# Patient Record
Sex: Male | Born: 1945
Health system: Southern US, Community
[De-identification: ages and names within clinical notes are randomized; demographics above are authoritative.]

## PROBLEM LIST (undated history)

## (undated) DIAGNOSIS — E785 Hyperlipidemia, unspecified: Secondary | ICD-10-CM

## (undated) DIAGNOSIS — U071 COVID-19: Secondary | ICD-10-CM

## (undated) DIAGNOSIS — M1990 Unspecified osteoarthritis, unspecified site: Secondary | ICD-10-CM

## (undated) DIAGNOSIS — H269 Unspecified cataract: Secondary | ICD-10-CM

## (undated) DIAGNOSIS — K5730 Diverticulosis of large intestine without perforation or abscess without bleeding: Secondary | ICD-10-CM

## (undated) DIAGNOSIS — C801 Malignant (primary) neoplasm, unspecified: Secondary | ICD-10-CM

## (undated) DIAGNOSIS — C61 Malignant neoplasm of prostate: Secondary | ICD-10-CM

## (undated) DIAGNOSIS — I2510 Atherosclerotic heart disease of native coronary artery without angina pectoris: Secondary | ICD-10-CM

## (undated) DIAGNOSIS — D126 Benign neoplasm of colon, unspecified: Secondary | ICD-10-CM

## (undated) DIAGNOSIS — R011 Cardiac murmur, unspecified: Secondary | ICD-10-CM

## (undated) DIAGNOSIS — E079 Disorder of thyroid, unspecified: Secondary | ICD-10-CM

## (undated) HISTORY — DX: Diverticulosis of large intestine without perforation or abscess without bleeding: K57.30

## (undated) HISTORY — DX: Disorder of thyroid, unspecified: E07.9

## (undated) HISTORY — DX: Malignant neoplasm of prostate: C61

## (undated) HISTORY — DX: Hyperlipidemia, unspecified: E78.5

## (undated) HISTORY — PX: POLYPECTOMY: SHX149

## (undated) HISTORY — DX: Unspecified cataract: H26.9

## (undated) HISTORY — DX: Malignant (primary) neoplasm, unspecified: C80.1

## (undated) HISTORY — DX: COVID-19: U07.1

## (undated) HISTORY — PX: TONSILLECTOMY: SUR1361

## (undated) HISTORY — PX: CARDIAC CATHETERIZATION: SHX172

## (undated) HISTORY — DX: Benign neoplasm of colon, unspecified: D12.6

---

## 1967-05-25 HISTORY — PX: KNEE SURGERY: SHX244

## 1985-05-24 HISTORY — PX: HERNIA REPAIR: SHX51

## 1997-05-24 HISTORY — PX: COLONOSCOPY: SHX174

## 1998-05-24 HISTORY — PX: LAMINECTOMY: SHX219

## 2002-05-24 HISTORY — PX: COLONOSCOPY: SHX174

## 2002-07-18 ENCOUNTER — Encounter: Payer: Self-pay | Admitting: Internal Medicine

## 2002-07-18 ENCOUNTER — Encounter: Admission: RE | Admit: 2002-07-18 | Discharge: 2002-07-18 | Payer: Self-pay | Admitting: Internal Medicine

## 2002-10-17 ENCOUNTER — Encounter: Payer: Self-pay | Admitting: Internal Medicine

## 2002-10-17 ENCOUNTER — Encounter: Admission: RE | Admit: 2002-10-17 | Discharge: 2002-10-17 | Payer: Self-pay | Admitting: Internal Medicine

## 2003-07-29 ENCOUNTER — Encounter: Payer: Self-pay | Admitting: Internal Medicine

## 2003-09-27 ENCOUNTER — Encounter: Admission: RE | Admit: 2003-09-27 | Discharge: 2003-09-27 | Payer: Self-pay | Admitting: Internal Medicine

## 2003-10-23 HISTORY — PX: HERNIA REPAIR: SHX51

## 2003-10-31 ENCOUNTER — Ambulatory Visit (HOSPITAL_COMMUNITY): Admission: RE | Admit: 2003-10-31 | Discharge: 2003-10-31 | Payer: Self-pay | Admitting: Surgery

## 2003-10-31 ENCOUNTER — Ambulatory Visit (HOSPITAL_BASED_OUTPATIENT_CLINIC_OR_DEPARTMENT_OTHER): Admission: RE | Admit: 2003-10-31 | Discharge: 2003-10-31 | Payer: Self-pay | Admitting: Surgery

## 2004-05-07 ENCOUNTER — Ambulatory Visit: Payer: Self-pay | Admitting: Internal Medicine

## 2004-05-14 ENCOUNTER — Ambulatory Visit (HOSPITAL_COMMUNITY): Admission: RE | Admit: 2004-05-14 | Discharge: 2004-05-15 | Payer: Self-pay | Admitting: Specialist

## 2004-06-09 ENCOUNTER — Ambulatory Visit: Payer: Self-pay | Admitting: Internal Medicine

## 2004-06-23 ENCOUNTER — Ambulatory Visit: Payer: Self-pay | Admitting: Internal Medicine

## 2004-10-28 ENCOUNTER — Ambulatory Visit: Payer: Self-pay | Admitting: Internal Medicine

## 2004-11-17 ENCOUNTER — Ambulatory Visit: Payer: Self-pay | Admitting: Internal Medicine

## 2004-12-11 ENCOUNTER — Ambulatory Visit: Payer: Self-pay

## 2005-03-04 ENCOUNTER — Ambulatory Visit: Payer: Self-pay | Admitting: Internal Medicine

## 2005-03-24 ENCOUNTER — Ambulatory Visit: Payer: Self-pay | Admitting: Internal Medicine

## 2005-03-25 ENCOUNTER — Ambulatory Visit: Payer: Self-pay | Admitting: Internal Medicine

## 2005-04-16 HISTORY — PX: PROSTATECTOMY: SHX69

## 2005-05-12 HISTORY — PX: BACK SURGERY: SHX140

## 2005-08-18 ENCOUNTER — Ambulatory Visit: Payer: Self-pay | Admitting: Internal Medicine

## 2006-06-20 ENCOUNTER — Ambulatory Visit: Payer: Self-pay | Admitting: Internal Medicine

## 2006-06-20 LAB — CONVERTED CEMR LAB
ALT: 35 units/L (ref 0–40)
AST: 36 units/L (ref 0–37)
Albumin: 4.1 g/dL (ref 3.5–5.2)
Alkaline Phosphatase: 42 units/L (ref 39–117)
BUN: 18 mg/dL (ref 6–23)
Basophils Absolute: 0 10*3/uL (ref 0.0–0.1)
Basophils Relative: 0.7 % (ref 0.0–1.0)
CO2: 26 meq/L (ref 19–32)
Calcium: 9.2 mg/dL (ref 8.4–10.5)
Chloride: 109 meq/L (ref 96–112)
Cholesterol: 146 mg/dL (ref 0–200)
Creatinine, Ser: 0.8 mg/dL (ref 0.4–1.5)
Eosinophils Absolute: 0.2 10*3/uL (ref 0.0–0.6)
Eosinophils Relative: 4 % (ref 0.0–5.0)
GFR calc Af Amer: 127 mL/min
GFR calc non Af Amer: 105 mL/min
Glucose, Bld: 96 mg/dL (ref 70–99)
HCT: 43.7 % (ref 39.0–52.0)
HDL: 44 mg/dL (ref >39.0)
Hemoglobin: 15.4 g/dL (ref 13.0–17.0)
LDL Cholesterol: 83 mg/dL (ref 0–99)
Lymphocytes Relative: 23.9 % (ref 12.0–46.0)
MCHC: 35.2 g/dL (ref 30.0–36.0)
MCV: 95.5 fL (ref 78.0–100.0)
Monocytes Absolute: 0.8 10*3/uL — ABNORMAL HIGH (ref 0.2–0.7)
Monocytes Relative: 15.2 % — ABNORMAL HIGH (ref 3.0–11.0)
Neutro Abs: 3.1 10*3/uL (ref 1.4–7.7)
Neutrophils Relative %: 56.2 % (ref 43.0–77.0)
Platelets: 226 10*3/uL (ref 150–400)
Potassium: 4.5 meq/L (ref 3.5–5.1)
RBC: 4.58 M/uL (ref 4.22–5.81)
RDW: 13.1 % (ref 11.5–14.6)
Sodium: 141 meq/L (ref 135–145)
TSH: 4.75 microintl units/mL (ref 0.35–5.50)
Total Bilirubin: 0.6 mg/dL (ref 0.3–1.2)
Total CHOL/HDL Ratio: 3.3
Total Protein: 7 g/dL (ref 6.0–8.3)
Triglycerides: 95 mg/dL (ref 0–149)
VLDL: 19 mg/dL (ref 0–40)
WBC: 5.4 10*3/uL (ref 4.5–10.5)

## 2006-06-27 ENCOUNTER — Ambulatory Visit: Payer: Self-pay | Admitting: Internal Medicine

## 2006-06-27 LAB — CONVERTED CEMR LAB
Basophils Absolute: 0 10*3/uL (ref 0.0–0.1)
Basophils Relative: 0.3 % (ref 0.0–1.0)
Eosinophils Absolute: 0.2 10*3/uL (ref 0.0–0.6)
Eosinophils Relative: 1.6 % (ref 0.0–5.0)
HCT: 43.1 % (ref 39.0–52.0)
Hemoglobin: 15 g/dL (ref 13.0–17.0)
Lymphocytes Relative: 14 % (ref 12.0–46.0)
MCHC: 34.8 g/dL (ref 30.0–36.0)
MCV: 95.2 fL (ref 78.0–100.0)
Monocytes Absolute: 1.5 10*3/uL — ABNORMAL HIGH (ref 0.2–0.7)
Monocytes Relative: 15 % — ABNORMAL HIGH (ref 3.0–11.0)
Neutro Abs: 6.8 10*3/uL (ref 1.4–7.7)
Neutrophils Relative %: 69.1 % (ref 43.0–77.0)
Platelets: 240 10*3/uL (ref 150–400)
RBC: 4.53 M/uL (ref 4.22–5.81)
RDW: 12.8 % (ref 11.5–14.6)
WBC: 9.9 10*3/uL (ref 4.5–10.5)

## 2006-08-30 ENCOUNTER — Ambulatory Visit: Payer: Self-pay | Admitting: Internal Medicine

## 2006-09-12 ENCOUNTER — Ambulatory Visit: Payer: Self-pay | Admitting: Internal Medicine

## 2007-01-12 ENCOUNTER — Ambulatory Visit: Payer: Self-pay | Admitting: Internal Medicine

## 2007-01-19 ENCOUNTER — Encounter (INDEPENDENT_AMBULATORY_CARE_PROVIDER_SITE_OTHER): Payer: Self-pay | Admitting: *Deleted

## 2007-01-19 LAB — CONVERTED CEMR LAB
ALT: 26 units/L (ref 0–53)
AST: 22 units/L (ref 0–37)
Cholesterol: 193 mg/dL (ref 0–200)
HDL: 42.7 mg/dL (ref >39.0)
LDL Cholesterol: 114 mg/dL — ABNORMAL HIGH (ref 0–99)
Total CHOL/HDL Ratio: 4.5
Triglycerides: 183 mg/dL — ABNORMAL HIGH (ref 0–149)
VLDL: 37 mg/dL (ref 0–40)

## 2007-02-06 ENCOUNTER — Ambulatory Visit: Payer: Self-pay | Admitting: Internal Medicine

## 2007-02-06 LAB — CONVERTED CEMR LAB
Cholesterol, target level: 200 mg/dL
HDL goal, serum: 40 mg/dL
LDL Goal: 160 mg/dL

## 2007-03-03 ENCOUNTER — Ambulatory Visit: Payer: Self-pay | Admitting: Internal Medicine

## 2007-03-03 DIAGNOSIS — K5730 Diverticulosis of large intestine without perforation or abscess without bleeding: Secondary | ICD-10-CM | POA: Insufficient documentation

## 2007-03-03 LAB — CONVERTED CEMR LAB
Basophils Absolute: 0 10*3/uL (ref 0.0–0.1)
Basophils Relative: 0.4 % (ref 0.0–1.0)
Eosinophils Absolute: 0.2 10*3/uL (ref 0.0–0.6)
Eosinophils Relative: 4.2 % (ref 0.0–5.0)
HCT: 42.6 % (ref 39.0–52.0)
Hemoglobin: 15.1 g/dL (ref 13.0–17.0)
Lymphocytes Relative: 25.4 % (ref 12.0–46.0)
MCHC: 35.3 g/dL (ref 30.0–36.0)
MCV: 95.7 fL (ref 78.0–100.0)
Monocytes Absolute: 0.9 10*3/uL — ABNORMAL HIGH (ref 0.2–0.7)
Monocytes Relative: 16.1 % — ABNORMAL HIGH (ref 3.0–11.0)
Neutro Abs: 3 10*3/uL (ref 1.4–7.7)
Neutrophils Relative %: 53.9 % (ref 43.0–77.0)
Platelets: 230 10*3/uL (ref 150–400)
RBC: 4.45 M/uL (ref 4.22–5.81)
RDW: 13.3 % (ref 11.5–14.6)
WBC: 5.5 10*3/uL (ref 4.5–10.5)

## 2007-03-06 ENCOUNTER — Encounter (INDEPENDENT_AMBULATORY_CARE_PROVIDER_SITE_OTHER): Payer: Self-pay | Admitting: *Deleted

## 2007-03-08 ENCOUNTER — Ambulatory Visit: Payer: Self-pay | Admitting: Internal Medicine

## 2007-03-10 ENCOUNTER — Encounter (INDEPENDENT_AMBULATORY_CARE_PROVIDER_SITE_OTHER): Payer: Self-pay | Admitting: *Deleted

## 2007-03-23 ENCOUNTER — Encounter: Payer: Self-pay | Admitting: Internal Medicine

## 2007-03-29 ENCOUNTER — Ambulatory Visit: Payer: Self-pay | Admitting: Internal Medicine

## 2007-03-29 LAB — CONVERTED CEMR LAB
BUN: 11 mg/dL (ref 6–23)
Creatinine, Ser: 0.7 mg/dL (ref 0.4–1.5)

## 2007-03-30 ENCOUNTER — Ambulatory Visit: Payer: Self-pay | Admitting: Cardiology

## 2007-04-07 ENCOUNTER — Encounter: Payer: Self-pay | Admitting: Internal Medicine

## 2007-04-07 ENCOUNTER — Encounter (INDEPENDENT_AMBULATORY_CARE_PROVIDER_SITE_OTHER): Payer: Self-pay | Admitting: *Deleted

## 2007-05-15 ENCOUNTER — Ambulatory Visit: Payer: Self-pay | Admitting: Gastroenterology

## 2007-05-25 HISTORY — PX: COLONOSCOPY: SHX174

## 2007-05-29 ENCOUNTER — Ambulatory Visit: Payer: Self-pay | Admitting: Internal Medicine

## 2007-06-15 ENCOUNTER — Ambulatory Visit: Payer: Self-pay | Admitting: Internal Medicine

## 2007-06-15 DIAGNOSIS — E7800 Pure hypercholesterolemia, unspecified: Secondary | ICD-10-CM | POA: Insufficient documentation

## 2007-06-15 DIAGNOSIS — E785 Hyperlipidemia, unspecified: Secondary | ICD-10-CM | POA: Insufficient documentation

## 2007-06-26 ENCOUNTER — Ambulatory Visit: Payer: Self-pay | Admitting: Internal Medicine

## 2007-09-22 DIAGNOSIS — D126 Benign neoplasm of colon, unspecified: Secondary | ICD-10-CM

## 2007-09-22 HISTORY — DX: Benign neoplasm of colon, unspecified: D12.6

## 2007-10-02 ENCOUNTER — Ambulatory Visit: Payer: Self-pay | Admitting: Gastroenterology

## 2007-10-12 ENCOUNTER — Encounter: Payer: Self-pay | Admitting: Gastroenterology

## 2007-10-12 ENCOUNTER — Ambulatory Visit: Payer: Self-pay | Admitting: Gastroenterology

## 2007-10-22 ENCOUNTER — Encounter: Payer: Self-pay | Admitting: Gastroenterology

## 2008-01-31 ENCOUNTER — Ambulatory Visit: Payer: Self-pay | Admitting: Internal Medicine

## 2008-02-04 LAB — CONVERTED CEMR LAB
ALT: 21 units/L (ref 0–53)
AST: 25 units/L (ref 0–37)
Albumin: 3.8 g/dL (ref 3.5–5.2)
Alkaline Phosphatase: 58 units/L (ref 39–117)
Bilirubin, Direct: 0.1 mg/dL (ref 0.0–0.3)
Cholesterol: 184 mg/dL (ref 0–200)
Glucose, Bld: 88 mg/dL (ref 70–99)
HDL: 47.2 mg/dL (ref >39.0)
LDL Cholesterol: 116 mg/dL — ABNORMAL HIGH (ref 0–99)
Total Bilirubin: 0.8 mg/dL (ref 0.3–1.2)
Total CHOL/HDL Ratio: 3.9
Total Protein: 6.9 g/dL (ref 6.0–8.3)
Triglycerides: 103 mg/dL (ref 0–149)
VLDL: 21 mg/dL (ref 0–40)

## 2008-02-05 ENCOUNTER — Encounter (INDEPENDENT_AMBULATORY_CARE_PROVIDER_SITE_OTHER): Payer: Self-pay | Admitting: *Deleted

## 2008-02-16 ENCOUNTER — Encounter (INDEPENDENT_AMBULATORY_CARE_PROVIDER_SITE_OTHER): Payer: Self-pay | Admitting: *Deleted

## 2008-03-04 ENCOUNTER — Ambulatory Visit: Payer: Self-pay | Admitting: Internal Medicine

## 2008-03-04 DIAGNOSIS — G479 Sleep disorder, unspecified: Secondary | ICD-10-CM | POA: Insufficient documentation

## 2008-03-04 DIAGNOSIS — F17200 Nicotine dependence, unspecified, uncomplicated: Secondary | ICD-10-CM | POA: Insufficient documentation

## 2008-03-04 DIAGNOSIS — Z8546 Personal history of malignant neoplasm of prostate: Secondary | ICD-10-CM | POA: Insufficient documentation

## 2008-06-17 ENCOUNTER — Ambulatory Visit: Payer: Self-pay | Admitting: Internal Medicine

## 2008-06-17 DIAGNOSIS — R609 Edema, unspecified: Secondary | ICD-10-CM | POA: Insufficient documentation

## 2008-06-17 DIAGNOSIS — M5412 Radiculopathy, cervical region: Secondary | ICD-10-CM | POA: Insufficient documentation

## 2008-06-17 DIAGNOSIS — R209 Unspecified disturbances of skin sensation: Secondary | ICD-10-CM | POA: Insufficient documentation

## 2008-06-17 DIAGNOSIS — M159 Polyosteoarthritis, unspecified: Secondary | ICD-10-CM | POA: Insufficient documentation

## 2008-06-17 DIAGNOSIS — G5600 Carpal tunnel syndrome, unspecified upper limb: Secondary | ICD-10-CM | POA: Insufficient documentation

## 2008-06-20 ENCOUNTER — Encounter (INDEPENDENT_AMBULATORY_CARE_PROVIDER_SITE_OTHER): Payer: Self-pay | Admitting: *Deleted

## 2008-10-29 ENCOUNTER — Ambulatory Visit: Payer: Self-pay | Admitting: Internal Medicine

## 2008-10-31 ENCOUNTER — Ambulatory Visit: Payer: Self-pay | Admitting: Internal Medicine

## 2008-10-31 ENCOUNTER — Encounter (INDEPENDENT_AMBULATORY_CARE_PROVIDER_SITE_OTHER): Payer: Self-pay | Admitting: *Deleted

## 2008-11-01 ENCOUNTER — Telehealth (INDEPENDENT_AMBULATORY_CARE_PROVIDER_SITE_OTHER): Payer: Self-pay | Admitting: *Deleted

## 2008-11-11 ENCOUNTER — Ambulatory Visit: Payer: Self-pay | Admitting: Internal Medicine

## 2008-11-12 ENCOUNTER — Encounter: Payer: Self-pay | Admitting: Internal Medicine

## 2008-11-20 LAB — CONVERTED CEMR LAB
Cholesterol: 195 mg/dL (ref 0–200)
Cholesterol: 202 mg/dL — ABNORMAL HIGH (ref 0–200)
HDL: 57 mg/dL (ref >39)
LDL Cholesterol: 124 mg/dL — ABNORMAL HIGH (ref 0–99)
TSH: 5.591 microintl units/mL — ABNORMAL HIGH (ref 0.350–4.500)
Total CHOL/HDL Ratio: 3.5
Triglycerides: 106 mg/dL (ref <150)
VLDL: 21 mg/dL (ref 0–40)

## 2008-11-21 ENCOUNTER — Encounter (INDEPENDENT_AMBULATORY_CARE_PROVIDER_SITE_OTHER): Payer: Self-pay | Admitting: *Deleted

## 2009-01-29 ENCOUNTER — Telehealth (INDEPENDENT_AMBULATORY_CARE_PROVIDER_SITE_OTHER): Payer: Self-pay | Admitting: *Deleted

## 2009-02-04 ENCOUNTER — Ambulatory Visit: Payer: Self-pay | Admitting: Internal Medicine

## 2009-02-09 LAB — CONVERTED CEMR LAB: TSH: 3.83 microintl units/mL (ref 0.35–5.50)

## 2009-02-10 ENCOUNTER — Encounter (INDEPENDENT_AMBULATORY_CARE_PROVIDER_SITE_OTHER): Payer: Self-pay | Admitting: *Deleted

## 2009-02-20 ENCOUNTER — Telehealth (INDEPENDENT_AMBULATORY_CARE_PROVIDER_SITE_OTHER): Payer: Self-pay | Admitting: *Deleted

## 2009-02-27 ENCOUNTER — Telehealth (INDEPENDENT_AMBULATORY_CARE_PROVIDER_SITE_OTHER): Payer: Self-pay | Admitting: *Deleted

## 2009-03-20 ENCOUNTER — Telehealth (INDEPENDENT_AMBULATORY_CARE_PROVIDER_SITE_OTHER): Payer: Self-pay | Admitting: *Deleted

## 2009-03-21 ENCOUNTER — Encounter: Payer: Self-pay | Admitting: Internal Medicine

## 2009-04-25 ENCOUNTER — Ambulatory Visit: Payer: Self-pay | Admitting: Family

## 2009-08-01 ENCOUNTER — Telehealth: Payer: Self-pay | Admitting: Internal Medicine

## 2009-08-04 ENCOUNTER — Telehealth (INDEPENDENT_AMBULATORY_CARE_PROVIDER_SITE_OTHER): Payer: Self-pay | Admitting: *Deleted

## 2009-09-05 ENCOUNTER — Ambulatory Visit: Payer: Self-pay | Admitting: Internal Medicine

## 2009-09-05 DIAGNOSIS — M542 Cervicalgia: Secondary | ICD-10-CM | POA: Insufficient documentation

## 2009-09-08 LAB — CONVERTED CEMR LAB
Basophils Absolute: 0 10*3/uL (ref 0.0–0.1)
Basophils Relative: 0 % (ref 0–1)
Eosinophils Absolute: 0.2 10*3/uL (ref 0.0–0.7)
Eosinophils Relative: 2 % (ref 0–5)
HCT: 46.4 % (ref 39.0–52.0)
Hemoglobin: 15.7 g/dL (ref 13.0–17.0)
Lymphocytes Relative: 18 % (ref 12–46)
Lymphs Abs: 1.7 10*3/uL (ref 0.7–4.0)
MCHC: 33.8 g/dL (ref 30.0–36.0)
MCV: 97.7 fL (ref 78.0–100.0)
Monocytes Absolute: 1.3 10*3/uL — ABNORMAL HIGH (ref 0.1–1.0)
Monocytes Relative: 13 % — ABNORMAL HIGH (ref 3–12)
Neutro Abs: 6.4 10*3/uL (ref 1.7–7.7)
Neutrophils Relative %: 67 % (ref 43–77)
Platelets: 226 10*3/uL (ref 150–400)
RBC: 4.75 M/uL (ref 4.22–5.81)
RDW: 13.7 % (ref 11.5–15.5)
Sed Rate: 8 mm/hr (ref 0–16)
WBC: 9.5 10*3/uL (ref 4.0–10.5)

## 2009-10-02 ENCOUNTER — Encounter: Payer: Self-pay | Admitting: Internal Medicine

## 2009-11-18 ENCOUNTER — Encounter (INDEPENDENT_AMBULATORY_CARE_PROVIDER_SITE_OTHER): Payer: Self-pay | Admitting: *Deleted

## 2009-11-18 ENCOUNTER — Telehealth (INDEPENDENT_AMBULATORY_CARE_PROVIDER_SITE_OTHER): Payer: Self-pay | Admitting: *Deleted

## 2009-11-21 ENCOUNTER — Ambulatory Visit: Payer: Self-pay | Admitting: Internal Medicine

## 2009-11-25 LAB — CONVERTED CEMR LAB
ALT: 32 units/L (ref 0–53)
AST: 31 units/L (ref 0–37)
Albumin: 4.3 g/dL (ref 3.5–5.2)
Alkaline Phosphatase: 61 units/L (ref 39–117)
Bilirubin, Direct: 0.2 mg/dL (ref 0.0–0.3)
Cholesterol: 250 mg/dL — ABNORMAL HIGH (ref 0–200)
Direct LDL: 95.3 mg/dL
HDL: 50.1 mg/dL (ref >39.00)
Total Bilirubin: 0.4 mg/dL (ref 0.3–1.2)
Total CHOL/HDL Ratio: 5
Total Protein: 7.2 g/dL (ref 6.0–8.3)
Triglycerides: 782 mg/dL — ABNORMAL HIGH (ref 0.0–149.0)
VLDL: 156.4 mg/dL — ABNORMAL HIGH (ref 0.0–40.0)

## 2009-12-09 ENCOUNTER — Ambulatory Visit: Payer: Self-pay | Admitting: Internal Medicine

## 2009-12-16 ENCOUNTER — Encounter: Payer: Self-pay | Admitting: Internal Medicine

## 2009-12-26 ENCOUNTER — Encounter: Payer: Self-pay | Admitting: Internal Medicine

## 2010-02-26 ENCOUNTER — Telehealth (INDEPENDENT_AMBULATORY_CARE_PROVIDER_SITE_OTHER): Payer: Self-pay | Admitting: *Deleted

## 2010-03-06 ENCOUNTER — Telehealth: Payer: Self-pay | Admitting: Internal Medicine

## 2010-03-19 ENCOUNTER — Ambulatory Visit: Payer: Self-pay | Admitting: Internal Medicine

## 2010-03-19 DIAGNOSIS — K143 Hypertrophy of tongue papillae: Secondary | ICD-10-CM | POA: Insufficient documentation

## 2010-03-19 DIAGNOSIS — IMO0001 Reserved for inherently not codable concepts without codable children: Secondary | ICD-10-CM | POA: Insufficient documentation

## 2010-03-19 DIAGNOSIS — L719 Rosacea, unspecified: Secondary | ICD-10-CM | POA: Insufficient documentation

## 2010-03-19 DIAGNOSIS — J029 Acute pharyngitis, unspecified: Secondary | ICD-10-CM | POA: Insufficient documentation

## 2010-03-19 DIAGNOSIS — M2550 Pain in unspecified joint: Secondary | ICD-10-CM | POA: Insufficient documentation

## 2010-03-19 LAB — CONVERTED CEMR LAB: Rapid Strep: NEGATIVE

## 2010-03-20 LAB — CONVERTED CEMR LAB
Basophils Absolute: 0.1 10*3/uL (ref 0.0–0.1)
Basophils Relative: 0.5 % (ref 0.0–3.0)
Eosinophils Absolute: 0.1 10*3/uL (ref 0.0–0.7)
Eosinophils Relative: 1 % (ref 0.0–5.0)
HCT: 43.8 % (ref 39.0–52.0)
Hemoglobin: 15 g/dL (ref 13.0–17.0)
Lymphocytes Relative: 14.3 % (ref 12.0–46.0)
Lymphs Abs: 1.5 10*3/uL (ref 0.7–4.0)
MCHC: 34.3 g/dL (ref 30.0–36.0)
MCV: 98.9 fL (ref 78.0–100.0)
Monocytes Absolute: 1.2 10*3/uL — ABNORMAL HIGH (ref 0.1–1.0)
Monocytes Relative: 11.8 % (ref 3.0–12.0)
Neutro Abs: 7.6 10*3/uL (ref 1.4–7.7)
Neutrophils Relative %: 72.4 % (ref 43.0–77.0)
Platelets: 231 10*3/uL (ref 150.0–400.0)
RBC: 4.43 M/uL (ref 4.22–5.81)
RDW: 13.7 % (ref 11.5–14.6)
Sed Rate: 15 mm/hr (ref 0–22)
Total CK: 79 units/L (ref 7–232)
WBC: 10.4 10*3/uL (ref 4.5–10.5)

## 2010-03-31 ENCOUNTER — Ambulatory Visit: Payer: Self-pay | Admitting: Internal Medicine

## 2010-04-06 LAB — CONVERTED CEMR LAB
Cholesterol: 179 mg/dL (ref 0–200)
HDL: 56 mg/dL (ref >39.00)
Hgb A1c MFr Bld: 5.7 % (ref 4.6–6.5)
LDL Cholesterol: 100 mg/dL — ABNORMAL HIGH (ref 0–99)
TSH: 2.97 microintl units/mL (ref 0.35–5.50)
Total CHOL/HDL Ratio: 3
Triglycerides: 117 mg/dL (ref 0.0–149.0)
VLDL: 23.4 mg/dL (ref 0.0–40.0)

## 2010-04-08 ENCOUNTER — Ambulatory Visit: Payer: Self-pay | Admitting: Internal Medicine

## 2010-04-20 ENCOUNTER — Ambulatory Visit: Payer: Self-pay | Admitting: Internal Medicine

## 2010-04-20 DIAGNOSIS — J069 Acute upper respiratory infection, unspecified: Secondary | ICD-10-CM | POA: Insufficient documentation

## 2010-05-14 ENCOUNTER — Ambulatory Visit: Payer: Self-pay | Admitting: Internal Medicine

## 2010-05-14 DIAGNOSIS — R109 Unspecified abdominal pain: Secondary | ICD-10-CM | POA: Insufficient documentation

## 2010-05-15 ENCOUNTER — Encounter: Payer: Self-pay | Admitting: Internal Medicine

## 2010-05-15 ENCOUNTER — Telehealth: Payer: Self-pay | Admitting: Internal Medicine

## 2010-05-15 ENCOUNTER — Telehealth (INDEPENDENT_AMBULATORY_CARE_PROVIDER_SITE_OTHER): Payer: Self-pay | Admitting: *Deleted

## 2010-05-15 LAB — CONVERTED CEMR LAB
Basophils Absolute: 0.1 10*3/uL (ref 0.0–0.1)
Basophils Relative: 0.6 % (ref 0.0–3.0)
Eosinophils Absolute: 0.1 10*3/uL (ref 0.0–0.7)
Eosinophils Relative: 0.6 % (ref 0.0–5.0)
HCT: 39.3 % (ref 39.0–52.0)
Hemoglobin: 13.7 g/dL (ref 13.0–17.0)
Lymphocytes Relative: 17.7 % (ref 12.0–46.0)
Lymphs Abs: 1.7 10*3/uL (ref 0.7–4.0)
MCHC: 34.8 g/dL (ref 30.0–36.0)
MCV: 95.5 fL (ref 78.0–100.0)
Monocytes Absolute: 1.4 10*3/uL — ABNORMAL HIGH (ref 0.1–1.0)
Monocytes Relative: 14.2 % — ABNORMAL HIGH (ref 3.0–12.0)
Neutro Abs: 6.5 10*3/uL (ref 1.4–7.7)
Neutrophils Relative %: 66.9 % (ref 43.0–77.0)
Platelets: 218 10*3/uL (ref 150.0–400.0)
RBC: 4.11 M/uL — ABNORMAL LOW (ref 4.22–5.81)
RDW: 13.8 % (ref 11.5–14.6)
WBC: 9.7 10*3/uL (ref 4.5–10.5)

## 2010-05-21 ENCOUNTER — Telehealth: Payer: Self-pay | Admitting: Internal Medicine

## 2010-05-22 ENCOUNTER — Telehealth (INDEPENDENT_AMBULATORY_CARE_PROVIDER_SITE_OTHER): Payer: Self-pay | Admitting: *Deleted

## 2010-06-10 ENCOUNTER — Telehealth (INDEPENDENT_AMBULATORY_CARE_PROVIDER_SITE_OTHER): Payer: Self-pay | Admitting: *Deleted

## 2010-06-21 LAB — CONVERTED CEMR LAB
Cholesterol, target level: 200 mg/dL
Free T4: 0.7 ng/dL (ref 0.6–1.6)
HDL goal, serum: 40 mg/dL
LDL Goal: 120 mg/dL
TSH: 5.16 microintl units/mL (ref 0.35–5.50)

## 2010-06-23 NOTE — Assessment & Plan Note (Signed)
Summary: med refill - cbs   Vital Signs:  Patient Profile:   65 Years Old Male Height:     67.75 inches Weight:      186.8 pounds Temp:     98.2 degrees F oral Pulse rate:   71 / minute Resp:     15 per minute BP sitting:   120 / 70  (left arm) Cuff size:   large  Vitals Entered By: Shonna Chock (March 04, 2008 2:36 PM)                 Chief Complaint:  MEDICATION REFILL.  Lipid Management History:      Positive NCEP/ATP III risk factors include male age 89 years old or older, family history for ischemic heart disease (males less than 49 years old), and current tobacco user.  Negative NCEP/ATP III risk factors include non-diabetic, non-hypertensive, no ASHD (atherosclerotic heart disease), no prior stroke/TIA, no peripheral vascular disease, and no history of aortic aneurysm.       Current Allergies (reviewed today): ! * NEUROTIN ! * OXYCODONE ! LIPITOR ! * LEVITA ! * VIAGRA ! * CIALIS  Past Medical History:    diverticulosis, colon w/o hemorrhage    Hyperlipidemia    prostate cancer    colon polyp    hernia inguinal left side  Past Surgical History:    laminectomy L4-5    herniorrhaphy 1987    knee surgery, patellar fracture fragment     Tonsillectomy    colonoscopy neg ,1999    colonoscopy tics, hemorrhoids 2004    herniorrhaphy 10/2003    back surgery 05/12/2005    prostatectomy 04/16/2005   Family History:    Puncle CVA age 36    mother lung cancer smoker    father bladder cancer, prostate cancer,colon cancer,renal calculi    paternal uncle HTN, CVA ; sister lung CA d age 29  Social History:    no diet    Occupation: Community education officer [employer]    Married    Current Smoker    Alcohol use-yes       Risk Factors:  Tobacco use:  current Alcohol use:  yes Exercise:  yes    Times per week:  2    Type:  elliptical 20  min  w/o C-P sx    Review of Systems  General      Complains of sleep disorder and sweats.      Denies fatigue and weight loss.    Poor sleep ; night sweats several yrs  ENT      Complains of hoarseness.      Denies difficulty swallowing.  CV      Denies bluish discoloration of lips or nails, chest pain or discomfort, difficulty breathing at night, difficulty breathing while lying down, leg cramps with exertion, palpitations, shortness of breath with exertion, swelling of feet, and swelling of hands.  Resp      Denies cough, excessive snoring, hypersomnolence, morning headaches, and sputum productive.      CXray 6/09 post fall : NAD  GI      Complains of hemorrhoids.      Denies bloody stools, dark tarry stools, and indigestion.  GU      He sees Dr Margo Aye in [facility]  MS      Complains of low back pain.      Denies joint pain, mid back pain, and thoracic pain.  Derm      Denies changes in nail beds, dryness,  hair loss, and lesion(s).  Neuro      Denies numbness, tingling, and weakness.  Endo      Denies cold intolerance, excessive hunger, excessive thirst, excessive urination, heat intolerance, polyuria, and weight change.  Heme      Denies abnormal bruising and bleeding.   Physical Exam  General:     well-nourished,in no acute distress; alert,appropriate and cooperative throughout examination Neck:     No deformities, masses, or tenderness noted. Lungs:     Normal respiratory effort, chest expands symmetrically. Lungs are clear to auscultation, no crackles or wheezes. Heart:     normal rate, regular rhythm, no gallop, no rub, no JVD, no HJR, and grade 1 /6 systolic murmur.   Abdomen:     Bowel sounds positive,abdomen soft and non-tender without masses, organomegaly or hernias noted. Genitalia:     as per Dr Margo Aye Prostate:     as per Dr Margo Aye Msk:     No deformity or scoliosis noted of thoracic or lumbar spine.   Pulses:     R and L carotid,radial,dorsalis pedis and posterior tibial pulses are full and equal bilaterally Extremities:     No clubbing, cyanosis, edema. Minor DJD of  hands Neurologic:     alert & oriented X3 and DTRs symmetrical and normal.   Skin:     Intact without suspicious lesions or rashes Cervical Nodes:     No lymphadenopathy noted Axillary Nodes:     No palpable lymphadenopathy Psych:     memory intact for recent and remote, normally interactive, and good eye contact.      Impression & Recommendations:  Problem # 1:  HYPERLIPIDEMIA (ICD-272.4)  His updated medication list for this problem includes:    Vytorin 10-20 Mg Tabs (Ezetimibe-simvastatin) .Marland Kitchen... 1 by mouth qd  Orders: EKG w/ Interpretation (93000)   Problem # 2:  NONDEPENDENT TOBACCO USE DISORDER (ICD-305.1) cigar smoker Orders: EKG w/ Interpretation (93000)   Problem # 3:  PROSTATE CANCER, HX OF (ICD-V10.46) as per Dr Margo Aye  Problem # 4:  NEOPLASM, MALIGNANT, LUNG, FAMILY HX (ICD-V16.1)  Mother & sister Orders: EKG w/ Interpretation (93000)   Problem # 5:  DIVERTICULOSIS, COLON W/O HEM (ICD-562.10) as per Dr Russella Dar  Problem # 6:  SLEEP DISORDER (ICD-780.50)  Complete Medication List: 1)  Vytorin 10-20 Mg Tabs (Ezetimibe-simvastatin) .Marland Kitchen.. 1 by mouth qd 2)  Vit E  3)  Vit C  4)  Beta Carotene  5)  Clonazepam 0.5 Mg Tabs (Clonazepam) .Marland Kitchen.. 1 at bedtime prn  Lipid Assessment/Plan:      Based on NCEP/ATP III, the patient's risk factor category is "2 or more risk factors and a calculated 10 year CAD risk of < 20%".  The patient's lipid goals have been set as follows: Total cholesterol goal is 200; LDL cholesterol goal is 120; HDL cholesterol goal is 40; Triglyceride goal is 150.  His LDL cholesterol goal has been met.     Patient Instructions: 1)  Stop Smoking Tips: Choose a Quit date. Cut down before the Quit date. decide what you will do as a substitute when you feel the urge to smoke(gum,toothpick,exercise).   Prescriptions: CLONAZEPAM 0.5 MG TABS (CLONAZEPAM) 1 at bedtime prn  #30 x 5   Entered and Authorized by:   Marga Melnick MD   Signed by:   Marga Melnick MD on 03/04/2008   Method used:   Print then Give to Patient   RxID:   [id number] VYTORIN 10-20 MG  TABS (EZETIMIBE-SIMVASTATIN) 1 by mouth qd  #90 x 3   Entered and Authorized by:   Marga Melnick MD   Signed by:   Marga Melnick MD on 03/04/2008   Method used:   Print then Give to Patient   RxID:   [id number]  ]

## 2010-06-23 NOTE — Assessment & Plan Note (Signed)
Summary: 4 MONTH FOLLOWUP--NEEDS 1ST VISIT OF DAY///SPH   Vital Signs:  Patient profile:   65 year old male Height:      67.75 inches Weight:      180 pounds Temp:     98.1 degrees F oral Pulse rate:   80 / minute Resp:     12 per minute BP sitting:   118 / 76  (left arm)  Vitals Entered By: Jeremy Johann CMA (April 08, 2010 8:20 AM) CC: f/u review labs, Lipid Management   CC:  f/u review labs and Lipid Management.  History of Present Illness: Hyperlipidemia Follow-Up      This is a 65 year old man who presents for Hyperlipidemia follow-up.  The patient denies muscle aches, GI upset, abdominal pain, flushing, itching, constipation, diarrhea, and fatigue.  The patient denies the following symptoms: chest pain/pressure, exercise intolerance, dypsnea, palpitations, syncope, and pedal edema.  Compliance with medications (by patient report) has been near 100%.  Dietary compliance has been good.  The patient reports exercising daily as 30 min on Elliptical , calisthentics, & free weights. Lipids are @ goal..    Lipid Management History:      Positive NCEP/ATP III risk factors include male age 81 years old or older.  Negative NCEP/ATP III risk factors include non-diabetic, no family history for ischemic heart disease, non-tobacco-user status, non-hypertensive, no ASHD (atherosclerotic heart disease), no prior stroke/TIA, no peripheral vascular disease, and no history of aortic aneurysm.     Preventive Screening-Counseling & Management  Alcohol-Tobacco     Smoking Status: quit  Current Medications (verified): 1)  Vytorin 10-20 Mg  Tabs (Ezetimibe-Simvastatin) .Marland Kitchen.. 1 By Mouth Qd 2)  Vit E 3)  Vit C 4)  Beta Carotene 5)  Clonazepam 0.5 Mg Tabs (Clonazepam) .Marland Kitchen.. 1 At Bedtime As Needed 6)  Synthroid 25 Mcg Tabs (Levothyroxine Sodium) .Marland Kitchen.. 1 Once Daily 7)  Metrogel 1 % Gel (Metronidazole) .... Apply Once Daily After Cleansing  Allergies (verified): 1)  ! * Neurotin 2)  ! *  Oxycodone 3)  ! Lipitor 4)  ! * Levita 5)  ! * Viagra 6)  ! * Cialis  Past History:  Past Medical History: diverticulosis, colon w/o hemorrhage Hyperlipidemia: NMR Lipoprofile 2005: LDL 562(1308/657), HDL 66, TG 66. LDL goal = < 120, ideally < 95. Framingham Study LDL goal = < 160 if non smoker, < 130 if smoker prostate cancer colon polyp hernia inguinal left side  Family History: P uncle:HTN,alcoholism, CVA age 57 mother: lung cancer smoker father: bladder cancer, prostate cancer,colon cancer,renal calculi  ; sister: lung  cancer  d age 31  Social History: Smoking Status:  quit  Physical Exam  General:  well-nourished; alert,appropriate and cooperative throughout examination Heart:  Normal rate and regular rhythm. S1 and S2 normal without gallop, murmur, click, rub.S4 Pulses:  R and L carotid,radial,dorsalis pedis and posterior tibial pulses are full and equal bilaterally Psych:  Focused & intelligent   Impression & Recommendations:  Problem # 1:  HYPERLIPIDEMIA (ICD-272.4)  The following medications were removed from the medication list:    Vytorin 10-20 Mg Tabs (Ezetimibe-simvastatin) .Marland Kitchen... 1 by mouth qd His updated medication list for this problem includes:    Pravastatin Sodium 20 Mg Tabs (Pravastatin sodium) .Marland Kitchen... 1 at bedtime  Complete Medication List: 1)  Vit E  2)  Vit C  3)  Beta Carotene  4)  Clonazepam 0.5 Mg Tabs (Clonazepam) .Marland Kitchen.. 1 at bedtime as needed 5)  Synthroid 25 Mcg  Tabs (Levothyroxine sodium) .Marland Kitchen.. 1 once daily 6)  Metrogel 1 % Gel (Metronidazole) .... Apply once daily after cleansing 7)  Pravastatin Sodium 20 Mg Tabs (Pravastatin sodium) .Marland Kitchen.. 1 at bedtime  Lipid Assessment/Plan:      Based on NCEP/ATP III, the patient's risk factor category is "0-1 risk factors".  The patient's lipid goals are as follows: Total cholesterol goal is 200; LDL cholesterol goal is 120; HDL cholesterol goal is 40; Triglyceride goal is 150.  His LDL cholesterol goal  has been met.    Patient Instructions: 1)  Fasting Labs after 10 weeks of Pravastatin 20 mg at bedtime : 2)  Hepatic Panel ,CK, ICD-9:995.20 3)  Lipid Panel , ICD-9:272.4 4)  Take an 81 mg coated Aspirin every day. Prescriptions: PRAVASTATIN SODIUM 20 MG TABS (PRAVASTATIN SODIUM) 1 at bedtime  #90 x 0   Entered and Authorized by:   Marga Melnick MD   Signed by:   Marga Melnick MD on 04/08/2010   Method used:   Print then Give to Patient   RxID:   [id number]    Orders Added: 1)  Est. Patient Level III [36644]

## 2010-06-23 NOTE — Consult Note (Signed)
Summary: [facility]  [facility]   Imported By: Lanelle Bal 10/08/2009 09:57:55  _____________________________________________________________________  External Attachment:    Type:   Image     Comment:   External Document

## 2010-06-23 NOTE — Progress Notes (Signed)
Summary: REFILL REQUEST  Phone Note Refill Request Call back at [phone] Message from:  Pharmacy on November 18, 2009 9:33 AM  Refills Requested: Medication #1:  SYNTHROID 25 MCG TABS 1 once daily   Dosage confirmed as above?Dosage Confirmed   Supply Requested: 3 months   Last Refilled: 08/04/2009  Medication #2:  VYTORIN 10-20 MG  TABS 1 by mouth qd   Dosage confirmed as above?Dosage Confirmed   Supply Requested: 3 months   Last Refilled: 09/16/2009 [facility] DELIVERY  Next Appointment Scheduled: NONE Initial call taken by: Lavell Islam,  November 18, 2009 9:34 AM    Prescriptions: SYNTHROID 25 MCG TABS (LEVOTHYROXINE SODIUM) 1 once daily, Additional Refills require an Appointment  #90 x 0   Entered by:   Shonna Chock   Authorized by:   Marga Melnick MD   Signed by:   Shonna Chock on 11/18/2009   Method used:   Printed then faxed to ...       [facility] (mail-order)             , [place]         Ph: [phone]       Fax: [phone]   RxID:   [id number] VYTORIN 10-20 MG  TABS (EZETIMIBE-SIMVASTATIN) 1 by mouth qd  #90 x 0   Entered by:   Shonna Chock   Authorized by:   Marga Melnick MD   Signed by:   Shonna Chock on 11/18/2009   Method used:   Printed then faxed to ...       [facility] (mail-order)             , [place]         Ph: [phone]       Fax: [phone]   RxID:   [id number]

## 2010-06-23 NOTE — Letter (Signed)
Summary: Results Follow up Letter  [place] [facility]  [address] [place], [place] [address]   Phone: [phone]  Fax: [phone]    01/19/2007 MRN: [id number]  Jeremy Waters [address] [place], [place]  [address]  Dear Mr. Troia,  The following are the results of your recent test(s):  Test         Result    Pap Smear:        Normal _____  Not Normal _____ Comments: ______________________________________________________ Cholesterol: LDL(Bad cholesterol):         Your goal is less than:         HDL (Good cholesterol):       Your goal is more than: Comments:  ______________________________________________________ Mammogram:        Normal _____  Not Normal _____ Comments:  ___________________________________________________________________ Hemoccult:        Normal _____  Not normal _______ Comments:    _____________________________________________________________________ Other Tests:  Please see attached results and comments   We routinely do not discuss normal results over the telephone.  If you desire a copy of the results, or you have any questions about this information we can discuss them at your next office visit.   Sincerely,

## 2010-06-23 NOTE — Letter (Signed)
Summary: Primary Care Appointment Letter  [facility] [place]  [address] [place], [place] [address]   Phone: [phone]  Fax: [phone]    02/16/2008 MRN: [id number]  LATAVIUS CAPIZZI [address] [place], [place]  [address]  Dear Mr. Branford,   Your Primary Care Physician  has indicated that:    _______it is time to schedule an appointment.    _______you missed your appointment on______ and need to call and          reschedule.    _______you need to have lab work done.    _______you need to schedule an appointment discuss lab or test results.    ___X____we reschedule your appt with Dr. Alwyn Ren from 02/19/08 @ 3:30 to Oct. 12, 2009 @ 2:00pm     Please call our office if you have any questions at [phone]     Thank you,    [facility] Primary Care Scheduler

## 2010-06-23 NOTE — Letter (Signed)
Summary: Results Follow up Letter  [place] at [facility]  [address] [place], [place] [address]   Phone: [phone]  Fax: [phone]    06/20/2008 MRN: [id number]  BRYNDAN BILYK [address] [place], [place]  [address]  Dear Mr. Gidley,  The following are the results of your recent test(s):  Test         Result    Pap Smear:        Normal _____  Not Normal _____ Comments: ______________________________________________________ Cholesterol: LDL(Bad cholesterol):         Your goal is less than:         HDL (Good cholesterol):       Your goal is more than: Comments:  ______________________________________________________ Mammogram:        Normal _____  Not Normal _____ Comments:  ___________________________________________________________________ Hemoccult:        Normal _____  Not normal _______ Comments:    _____________________________________________________________________ Other Tests: PLEASE SEE ATTACHED LABS DONE ON 06/17/2008 AND APPOINTMENT CARD TO RECHECK LABS IN 4-6 MONTHS    We routinely do not discuss normal results over the telephone.  If you desire a copy of the results, or you have any questions about this information we can discuss them at your next office visit.   Sincerely,

## 2010-06-23 NOTE — Progress Notes (Signed)
Summary: hopp-refill  Phone Note Refill Request Call back at Home Phone [phone] Message from:  Patient wife on [facility]  Refills Requested: Medication #1:  VYTORIN 10-20 MG  TABS 1 by mouth qd Initial call taken by: Jeremy Johann CMA,  March 20, 2009 9:14 AM  Follow-up for Phone Call        left pt detail message rx sent to pharmacy..........Marland KitchenFelecia Deloach CMA  March 20, 2009 9:19 AM     Prescriptions: VYTORIN 10-20 MG  TABS (EZETIMIBE-SIMVASTATIN) 1 by mouth qd  #90 x 2   Entered by:   Jeremy Johann CMA   Authorized by:   Marga Melnick MD   Signed by:   Jeremy Johann CMA on 03/20/2009   Method used:   Faxed to ...       [facility] (mail-order)             , [place]         Ph: [phone]       Fax: [phone]   RxID:   [id number]

## 2010-06-23 NOTE — Assessment & Plan Note (Signed)
Summary: SINUS INFECTION?/KN   Vital Signs:  Patient profile:   65 year old male Weight:      181.8 pounds BMI:     27.95 Temp:     98.4 degrees F oral Pulse rate:   64 / minute Resp:     14 per minute BP sitting:   122 / 78  (left arm) Cuff size:   large  Vitals Entered By: Shonna Chock CMA (April 20, 2010 4:13 PM) CC: Sinus Infection x 8-9 days , URI symptoms   CC:  Sinus Infection x 8-9 days  and URI symptoms.  History of Present Illness: URI Symptoms      This is a 65 year old man who presents with URI symptoms x 10 days.  The patient reports nasal congestion, purulent nasal discharge as PNDrainage with some  sore throat &  productive cough, and sick contacts, but denies earache.  The patient denies fever, dyspnea, and wheezing.  The patient denies headache.  The patient denies the following risk factors for Strep sinusitis: unilateral facial pain, tooth pain, and tender adenopathy.  Rx: Sudafed  Current Medications (verified): 1)  Vit E 2)  Vit C 3)  Beta Carotene 4)  Clonazepam 0.5 Mg Tabs (Clonazepam) .Marland Kitchen.. 1 At Bedtime As Needed 5)  Synthroid 25 Mcg Tabs (Levothyroxine Sodium) .Marland Kitchen.. 1 Once Daily 6)  Pravastatin Sodium 20 Mg Tabs (Pravastatin Sodium) .Marland Kitchen.. 1 At Bedtime  Allergies: 1)  ! * Neurotin 2)  ! * Oxycodone 3)  ! Lipitor 4)  ! * Levita 5)  ! * Viagra 6)  ! * Cialis  Physical Exam  General:  in no acute distress; alert,appropriate and cooperative throughout examination Ears:  External ear exam shows no significant lesions or deformities.  Otoscopic examination reveals clear canals, tympanic membranes are intact bilaterally without bulging, retraction, inflammation or discharge. Hearing is grossly normal bilaterally. Nose:  External nasal examination shows no deformity or inflammation. Nasal mucosa are pink and moist without lesions or exudates. Mouth:  Oral mucosa and oropharynx without lesions or exudates.  Teeth in good repair. Mild erythema Lungs:   Normal respiratory effort, chest expands symmetrically. Lungs are clear to auscultation, no crackles or wheezes. Cervical Nodes:  No lymphadenopathy noted Axillary Nodes:  No palpable lymphadenopathy   Impression & Recommendations:  Problem # 1:  URI (ICD-465.9)  Complete Medication List: 1)  Vit E  2)  Vit C  3)  Beta Carotene  4)  Clonazepam 0.5 Mg Tabs (Clonazepam) .Marland Kitchen.. 1 at bedtime as needed 5)  Synthroid 25 Mcg Tabs (Levothyroxine sodium) .Marland Kitchen.. 1 once daily 6)  Pravastatin Sodium 20 Mg Tabs (Pravastatin sodium) .Marland Kitchen.. 1 at bedtime 7)  Azithromycin 250 Mg Tabs (Azithromycin) .... As per pack  Patient Instructions: 1)  Neti pot once daily as needed for congestion. 2)  Drink as much  NON dairy fluid as you can tolerate for the next few days. Prescriptions: AZITHROMYCIN 250 MG TABS (AZITHROMYCIN) as per pack  #1 x 0   Entered and Authorized by:   Marga Melnick MD   Signed by:   Marga Melnick MD on 04/20/2010   Method used:   Faxed to ...       [facility] [address] (retail)       [address]       [place], [place]  [address]       Ph: [phone] or [phone]       Fax: [phone]   RxID:  [id number]    Orders Added: 1)  Est. Patient Level III [[address]782]

## 2010-06-23 NOTE — Miscellaneous (Signed)
Summary: GI previsit  Clinical Lists Changes  Medications: Added new medication of MOVIPREP 100 GM  SOLR (PEG-KCL-NACL-NASULF-NA ASC-C) As per prep instructions. - Signed Rx of MOVIPREP 100 GM  SOLR (PEG-KCL-NACL-NASULF-NA ASC-C) As per prep instructions.;  #1 x 0;  Signed;  Entered by: Jennye Boroughs RN;  Authorized by: Meryl Dare MD;  Method used: Electronic    Prescriptions: MOVIPREP 100 GM  SOLR (PEG-KCL-NACL-NASULF-NA ASC-C) As per prep instructions.  #1 x 0   Entered by:   Jennye Boroughs RN   Authorized by:   Meryl Dare MD   Signed by:   Jennye Boroughs RN on 10/02/2007   Method used:   Electronically sent to ...       [id number]       [address]       [place], [place]  [address]       Ph: [phone]       Fax: [phone]   RxID:   [id number]

## 2010-06-23 NOTE — Progress Notes (Signed)
Summary: STILL HAVE PROBLEMS WITH LEVOTHYROXINE PRESCRIPTION  Phone Note Call from Patient   Caller: Patient Summary of Call: PATIENT STATES HE JUST HUNG UP FROM [facility] AND THEY STILL HAVE NO RECORD OF HIS LEVOTHYROXINE PRESCRIPTION---HE SAYS THAT THEY ALSO NEED A FAXED SIGNATURE BY DR HOPPER  HE GAVE CONTACT PHONE NUMBER AS [phone]  HE NEEDS TO BE CALLED AT [phone] TO BE REASSURED THAT PRESCRIPTIION HAS BEEN CALLED IN---HE SAYS THIS HAS BEEN GOING ON FOR 6 WEEKS AND HE HAS BEEN OUT OF HIS MEDICATION FOR OVER ONE WEEK Initial call taken by: Jerolyn Shin,  March 06, 2010 3:53 PM  Follow-up for Phone Call        Received vm at [facility] delivery and left message that i spoke with someone earlier today and re-provided prescription. I was adament that this has been taken care of and I need a call back before 5 pm today. Follow-up by: Lucious Groves CMA,  March 06, 2010 3:59 PM  Additional Follow-up for Phone Call Additional follow up Details #1::        I did not receive a call back, left second message on voicemail. Pt notified. Lucious Groves CMA  March 06, 2010 5:16 PM    *Patient will call if he has not heard from them by Monday.

## 2010-06-23 NOTE — Assessment & Plan Note (Signed)
Summary: stiff neck//lch   Vital Signs:  Patient profile:   65 year old male Weight:      190 pounds Temp:     98.5 degrees F oral Pulse rate:   80 / minute Resp:     17 per minute BP sitting:   130 / 78  (left arm)  Vitals Entered By: Jeremy Johann CMA (September 05, 2009 1:06 PM) CC: stiff neck x5days Comments REVIEWED MED LIST, PATIENT AGREED DOSE AND INSTRUCTION CORRECT    CC:  stiff neck x5days.  History of Present Illness: Onset as neck stiffness 09/01/2009 while @ computer , improved some 04/12 & 13 after 1 Naproxen. Severe stiffness & throbbing  - sharp  pain  up to a 10 as of 09/04/2009, worse than 04/11.No PMH of neck injury.  Allergies: 1)  ! * Neurotin 2)  ! * Oxycodone 3)  ! Lipitor 4)  ! * Levita 5)  ! * Viagra 6)  ! * Cialis  Review of Systems General:  Denies chills, fever, and sweats. Eyes:  Denies blurring, double vision, and vision loss-both eyes. ENT:  Denies nasal congestion, sinus pressure, and sore throat; No frontal headache, facial pain or purulence. Resp:  Denies cough and sputum productive. GI:  Denies change in bowel habits, constipation, and diarrhea. GU:  Denies discharge, dysuria, hematuria, and incontinence. MS:  Complains of joint pain; denies joint redness and joint swelling; Some shoulder pain.. Derm:  Denies changes in color of skin, lesion(s), and rash; No tick exposure. Neuro:  Denies brief paralysis, numbness, tingling, and weakness; Pain in cervical spine with elevation of LUE.  Physical Exam  General:  Very uncomfortable appearing but in no acute distress; alert,appropriate and cooperative throughout examination Eyes:  No corneal or conjunctival inflammation noted. EOMI. Perrla. Field of Vision & vision  grossly normal. Ears:  External ear exam shows no significant lesions or deformities.  Otoscopic examination reveals clear canals, tympanic membranes are intact bilaterally without bulging, retraction, inflammation or discharge.  Hearing is grossly normal bilaterally.Wax bilaterally Nose:  External nasal examination shows no deformity or inflammation. Nasal mucosa are pink and moist without lesions or exudates.Septum to R Mouth:  Oral mucosa and oropharynx without lesions or exudates.  Teeth in good repair. Neck:  nuchal rigidity and decreased ROM.  Thyroid normal Lungs:  Normal respiratory effort, chest expands symmetrically. Lungs are clear to auscultation, no crackles or wheezes. Heart:  Normal rate and regular rhythm. S1 and S2 normal without gallop, murmur, click, rub.S4 Abdomen:  Bowel sounds positive,abdomen soft and non-tender without masses, organomegaly or hernias noted. Extremities:  No clubbing, cyanosis, edema.Negative SLR  Neurologic:  alert & oriented X3, cranial nerves II-XII intact, strength normal in all extremities, sensation intact to light touch, gait normal, and DTRs symmetrical and normal.   Skin:  Intact without suspicious lesions or rashes Cervical Nodes:  No lymphadenopathy noted Axillary Nodes:  No palpable lymphadenopathy Psych:  memory intact for recent and remote, normally interactive, and good eye contact.     Impression & Recommendations:  Problem # 1:  CERVICALGIA (ICD-723.1)  Orders: Venipuncture ([address])  His updated medication list for this problem includes:    Cyclobenzaprine Hcl 5 Mg Tabs (Cyclobenzaprine hcl) .Marland Kitchen... 1 two times a day & 2 at bedtime as needed  Complete Medication List: 1)  Vytorin 10-20 Mg Tabs (Ezetimibe-simvastatin) .Marland Kitchen.. 1 by mouth qd 2)  Vit E  3)  Vit C  4)  Beta Carotene  5)  Clonazepam 0.5 Mg  Tabs (Clonazepam) .Marland Kitchen.. 1 at bedtime as needed 6)  Synthroid 25 Mcg Tabs (Levothyroxine sodium) .Marland Kitchen.. 1 once daily, additional refills require an appointment 7)  Augmentin 500-125 Mg Tabs (Amoxicillin-pot clavulanate) .... One tablet by mouth two times a day x 10 days 8)  Cheratussin Ac 100-10 Mg/42ml Syrp (Guaifenesin-codeine) .... Two teaspoons every 6 hour as  needed 9)  Prednisone 20 Mg Tabs (Prednisone) .Marland Kitchen.. 1 two times a day with a meal 10)  Cyclobenzaprine Hcl 5 Mg Tabs (Cyclobenzaprine hcl) .Marland Kitchen.. 1 two times a day & 2 at bedtime as needed  Patient Instructions: 1)  Take Celebrex 200 mg two times a day for pain. Report increasing pain, fever or rash. Monitor for ticks Prescriptions: CYCLOBENZAPRINE HCL 5 MG TABS (CYCLOBENZAPRINE HCL) 1 two times a day & 2 at bedtime as needed  #30 x 0   Entered and Authorized by:   Marga Melnick MD   Signed by:   Marga Melnick MD on 09/05/2009   Method used:   Faxed to ...       [facility] [phone]* (retail)       [address]       [place], [place]  [address]       Ph: [phone] or [phone]       Fax: [phone]   RxID:   [id number] PREDNISONE 20 MG TABS (PREDNISONE) 1 two times a day with a meal  #10 x 0   Entered and Authorized by:   Marga Melnick MD   Signed by:   Marga Melnick MD on 09/05/2009   Method used:   Faxed to ...       [facility] [address] (retail)       [address]       [place], [place]  [address]       Ph: [phone] or [phone]       Fax: [phone]   RxID:   [id number]

## 2010-06-23 NOTE — Assessment & Plan Note (Signed)
Summary: SORE THROAT & RASH ON FACE/RH......   Vital Signs:  Patient profile:   65 year old male Weight:      180 pounds BMI:     27.67 Temp:     98.0 degrees F oral Pulse rate:   64 / minute Resp:     15 per minute BP sitting:   106 / 68  (left arm) Cuff size:   large  Vitals Entered By: Shonna Chock CMA (March 19, 2010 12:06 PM) CC: Sore throat, discoloration on tounge, face itchy and achy x 2 days, URI symptoms   CC:  Sore throat, discoloration on tounge, face itchy and achy x 2 days, and URI symptoms.  History of Present Illness:      This is a 65 year old man who presents with multiple  symptoms ; initiially as green tongue posteriorly 10/24 followed by facial itching in beard distribution 10/25 & ST 03/18/2010 with arthalgias & myalgias.  The patient reports nasal congestion and sore throat, but denies purulent nasal discharge, productive cough, and earache.  The patient denies fever.  The patient denies itchy watery eyes, sneezing, and headache.  The patient denies the following risk factors for Strep sinusitis: unilateral nasal discharge, tooth pain, and tender adenopathy.  Rx: old Psoriasis cream with benefit.No new meds or cosmetics.  Current Medications (verified): 1)  Vytorin 10-20 Mg  Tabs (Ezetimibe-Simvastatin) .Marland Kitchen.. 1 By Mouth Qd 2)  Vit E 3)  Vit C 4)  Beta Carotene 5)  Clonazepam 0.5 Mg Tabs (Clonazepam) .Marland Kitchen.. 1 At Bedtime As Needed 6)  Synthroid 25 Mcg Tabs (Levothyroxine Sodium) .Marland Kitchen.. 1 Once Daily  Allergies: 1)  ! * Neurotin 2)  ! * Oxycodone 3)  ! Lipitor 4)  ! * Levita 5)  ! * Viagra 6)  ! * Cialis  Physical Exam  General:  well-nourished,in no acute distress; alert,appropriate and cooperative throughout examination Eyes:  No corneal or conjunctival inflammation noted. EOMI. Perrla. Vision grossly normal. Ears:  External ear exam shows no significant lesions or deformities.  Otoscopic examination reveals clear canals, tympanic membranes are intact  bilaterally without bulging, retraction, inflammation or discharge. Hearing is grossly normal bilaterally. Nose:  External nasal examination shows no deformity or inflammation. Nasal mucosa are pink and moist without lesions or exudates. Deviated septum Mouth:  Oral mucosa and oropharynx without lesions or exudates.  Teeth in good repair. Mild pharyngeal erythema.  Posterior tongue coated Skin:  Rosacea of face Cervical Nodes:  No lymphadenopathy noted Axillary Nodes:  No palpable lymphadenopathy   Impression & Recommendations:  Problem # 1:  HYPERTROPHY OF TONGUE PAPILLAE (ICD-529.3)  Orders: Venipuncture ([address])  Problem # 2:  SORE THROAT (ICD-462)  Orders: Rapid Strep (25366) Venipuncture (44034) TLB-CBC Platelet - w/Differential (85025-CBCD)  His updated medication list for this problem includes:    Doxycycline Hyclate 100 Mg Caps (Doxycycline hyclate) .Marland Kitchen... 1 two times a day ; avoid direct sun  Problem # 3:  ARTHRALGIA (ICD-719.40)  Orders: Venipuncture (74259) TLB-Sedimentation Rate (ESR) (85652-ESR)  Problem # 4:  MYALGIA (ICD-729.1)  Orders: Venipuncture (56387) TLB-CK Total Only(Creatine Kinase/CPK) (82550-CK) TLB-Sedimentation Rate (ESR) (85652-ESR)  Problem # 5:  ROSACEA (ICD-695.3)  Orders: Venipuncture (564[address])  Complete Medication List: 1)  Vytorin 10-20 Mg Tabs (Ezetimibe-simvastatin) .Marland Kitchen.. 1 by mouth qd 2)  Vit E  3)  Vit C  4)  Beta Carotene  5)  Clonazepam 0.5 Mg Tabs (Clonazepam) .Marland Kitchen.. 1 at bedtime as needed 6)  Synthroid 25 Mcg Tabs (Levothyroxine  sodium) .Marland Kitchen.. 1 once daily 7)  Metrogel 1 % Gel (Metronidazole) .... Apply once daily after cleansing 8)  Doxycycline Hyclate 100 Mg Caps (Doxycycline hyclate) .Marland Kitchen.. 1 two times a day ; avoid direct sun  Patient Instructions: 1)  Take 650-1000mg  of Tylenol every 4-6 hours as needed for relief of pain or comfort of fever AVOID taking more than 4000mg   in a 24 hour period (can cause liver damage in higher  doses). Prescriptions: DOXYCYCLINE HYCLATE 100 MG CAPS (DOXYCYCLINE HYCLATE) 1 two times a day ; avoid direct sun  #14 x 0   Entered and Authorized by:   Marga Melnick MD   Signed by:   Marga Melnick MD on 03/19/2010   Method used:   Print then Give to Patient   RxID:   [id number] METROGEL 1 % GEL (METRONIDAZOLE) apply once daily after cleansing  #30 grams x 1   Entered and Authorized by:   Marga Melnick MD   Signed by:   Marga Melnick MD on 03/19/2010   Method used:   Print then Give to Patient   RxID:   [id number]    Orders Added: 1)  Rapid Strep [87880] 2)  Est. Patient Level III [84696] 3)  Venipuncture [36415] 4)  TLB-CBC Platelet - w/Differential [85025-CBCD] 5)  TLB-CK Total Only(Creatine Kinase/CPK) [82550-CK] 6)  TLB-Sedimentation Rate (ESR) [85652-ESR]    Laboratory Results    Other Tests  Rapid Strep: negative

## 2010-06-23 NOTE — Progress Notes (Signed)
Summary: Synthroid rx  Phone Note Other Incoming   Initial call taken by: Lucious Groves CMA,  March 06, 2010 11:14 AM Summary of Call: Spoke with [facility] mail delivery and advised of synthroid prescription. Initial call taken by: Lucious Groves CMA,  March 06, 2010 11:14 AM

## 2010-06-23 NOTE — Progress Notes (Signed)
Summary: REFILL  Phone Note Call from Patient Call back at Home Phone [phone]   Caller: Spouse Summary of Call: PATIENT IS REQUESTING A PRESCRIPTION FOR SYNTHROID. PHARMACY [facility]. [phone] Initial call taken by: Barb Merino,  February 20, 2009 12:30 PM    Prescriptions: SYNTHROID 25 MCG TABS (LEVOTHYROXINE SODIUM) 1 once daily  #90 x 1   Entered by:   Jeremy Johann CMA   Authorized by:   Marga Melnick MD   Signed by:   Jeremy Johann CMA on 02/21/2009   Method used:   Faxed to ...       [facility] (mail-order)             , [place]         Ph: [phone]       Fax: [phone]   RxID:   [id number]

## 2010-06-23 NOTE — Letter (Signed)
Summary: Results Follow up Letter  [place] [facility]  [address] [place], [place] [address]   Phone: [phone]  Fax: [phone]    10/31/2008 MRN: [id number]  STETSON PELAEZ [address] [place], [place]  [address]  Dear Mr. Rockwell,  The following are the results of your recent test(s):  Test         Result    Pap Smear:        Normal _____  Not Normal _____ Comments: ______________________________________________________ Cholesterol: LDL(Bad cholesterol):         Your goal is less than:         HDL (Good cholesterol):       Your goal is more than: Comments:  ______________________________________________________ Mammogram:        Normal _____  Not Normal _____ Comments:  ___________________________________________________________________ Hemoccult:        Normal _____  Not normal _______ Comments:    _____________________________________________________________________ Other Tests: Please see attached Chest X-Ray done on 10/31/2008    We routinely do not discuss normal results over the telephone.  If you desire a copy of the results, or you have any questions about this information we can discuss them at your next office visit.   Sincerely,

## 2010-06-23 NOTE — Letter (Signed)
Summary: Primary Care Appointment Letter  [facility]  [address] [place], [place] [address]   Phone: [phone]  Fax: [phone]    11/18/2009 MRN: [id number]  Jeremy Waters [address] [place], [place]  [address]  Dear [name],   Your Primary Care Physician Marga Melnick MD has indicated that:    _______it is time to schedule an appointment.    _______you missed your appointment on______ and need to call and          reschedule.    _____X__you need to have lab work done(Lipid/Hep 272.4/995.20, TSH 244.9).    _______you need to schedule an appointment discuss lab or test results.    _______you need to call to reschedule your appointment that is                       scheduled on _________.     Please call our office as soon as possible. Our phone number is [phone]. Please press option 1. Our office is open 8a-5p, Monday through Friday.     Thank you,    [facility]

## 2010-06-23 NOTE — Letter (Signed)
Summary: Results Follow up Letter  [place] at [facility]  [address] [place], [place] [address]   Phone: [phone]  Fax: [phone]    03/06/2007 MRN: [id number]  Jeremy Waters [address] [place], [place]  [address]  Dear [name],  The following are the results of your recent test(s):  Test         Result    Pap Smear:        Normal _____  Not Normal _____ Comments: ______________________________________________________ Cholesterol: LDL(Bad cholesterol):         Your goal is less than:         HDL (Good cholesterol):       Your goal is more than: Comments:  ______________________________________________________ Mammogram:        Normal _____  Not Normal _____ Comments:  ___________________________________________________________________ Hemoccult:        Normal _____  Not normal _______ Comments:    _____________________________________________________________________ Other Tests:  Please see attached results and comments   We routinely do not discuss normal results over the telephone.  If you desire a copy of the results, or you have any questions about this information we can discuss them at your next office visit.   Sincerely,

## 2010-06-23 NOTE — Letter (Signed)
Summary: Results Follow up Letter  [place] at [facility]  [address] [place], [place] [address]   Phone: [phone]  Fax: [phone]    03/10/2007 MRN: [id number]  SHAHRAM ALEXOPOULOS [address] [place], [place]  [address]  Dear Mr. Raus,  The following are the results of your recent test(s):  Test         Result    Pap Smear:        Normal _____  Not Normal _____ Comments: ______________________________________________________ Cholesterol: LDL(Bad cholesterol):         Your goal is less than:         HDL (Good cholesterol):       Your goal is more than: Comments:  ______________________________________________________ Mammogram:        Normal _____  Not Normal _____ Comments:  ___________________________________________________________________ Hemoccult:        Normal __X___  Not normal _______ Comments:    _____________________________________________________________________ Other Tests:    We routinely do not discuss normal results over the telephone.  If you desire a copy of the results, or you have any questions about this information we can discuss them at your next office visit.   Sincerely,

## 2010-06-23 NOTE — Miscellaneous (Signed)
Summary: Orders Update   Clinical Lists Changes  Orders: Added new Referral order of Radiology Referral (Radiology) - Signed 

## 2010-06-23 NOTE — Assessment & Plan Note (Signed)
Summary: DISCUSS LABS AND RISKS///SPH   Vital Signs:  Patient profile:   65 year old male Weight:      193 pounds Pulse rate:   72 / minute Resp:     15 per minute BP sitting:   112 / 70  (left arm) Cuff size:   large  Vitals Entered By: Shonna Chock CMA (December 09, 2009 4:26 PM) CC: Follow-up visit: discuss labs    CC:  Follow-up visit: discuss labs .  History of Present Illness: labs reviewed & risks discussed; TG up from 106 in 06/10 to 782 .  Current Medications (verified): 1)  Vytorin 10-20 Mg  Tabs (Ezetimibe-Simvastatin) .Marland Kitchen.. 1 By Mouth Qd 2)  Vit E 3)  Vit C 4)  Beta Carotene 5)  Clonazepam 0.5 Mg Tabs (Clonazepam) .Marland Kitchen.. 1 At Bedtime As Needed 6)  Synthroid 25 Mcg Tabs (Levothyroxine Sodium) .Marland Kitchen.. 1 Once Daily, Additional Refills Require An Appointment  Allergies: 1)  ! * Neurotin 2)  ! * Oxycodone 3)  ! Lipitor 4)  ! * Levita 5)  ! * Viagra 6)  ! * Cialis  Review of Systems CV:  Denies chest pain or discomfort, leg cramps with exertion, palpitations, and shortness of breath with exertion. GI:  Denies abdominal pain, indigestion, nausea, and vomiting.  Physical Exam  General:  well-nourished,in no acute distress; alert,appropriate and cooperative throughout examination Heart:  Normal rate and regular rhythm. S1 and S2 normal without gallop, murmur, click, rub. S4 Abdomen:  Bowel sounds positive,abdomen soft and non-tender without masses, organomegaly or hernias noted. Pulses:  R and L carotid,radial,dorsalis pedis and posterior tibial pulses are full and equal bilaterally Skin:  Intact without suspicious lesions or rashes   Impression & Recommendations:  Problem # 1:  HYPERLIPIDEMIA (ICD-272.4)  His updated medication list for this problem includes:    Vytorin 10-20 Mg Tabs (Ezetimibe-simvastatin) .Marland Kitchen... 1 by mouth qd  Complete Medication List: 1)  Vytorin 10-20 Mg Tabs (Ezetimibe-simvastatin) .Marland Kitchen.. 1 by mouth qd 2)  Vit E  3)  Vit C  4)  Beta Carotene    5)  Clonazepam 0.5 Mg Tabs (Clonazepam) .Marland Kitchen.. 1 at bedtime as needed 6)  Synthroid 25 Mcg Tabs (Levothyroxine sodium) .Marland Kitchen.. 1 once daily, additional refills require an appointment  Patient Instructions: 1)  Consume < 40 grams of HFCS sugar / day as discussed. 2)  Please schedule a follow-up appointment in 4 months. 3)  Lipid Panel prior to visit, ICD-9:272.4 4)  TSH prior to visit, ICD-9:244.9 5)  HbgA1C prior to visit, ICD-9:272.4 Prescriptions: VYTORIN 10-20 MG  TABS (EZETIMIBE-SIMVASTATIN) 1 by mouth qd  #90 x 0   Entered and Authorized by:   Marga Melnick MD   Signed by:   Marga Melnick MD on 12/09/2009   Method used:   Print then Mail to Patient   RxID:   [id number]

## 2010-06-23 NOTE — Progress Notes (Signed)
Summary: RX  Phone Note Call from Patient Call back at Work Phone [phone]   Caller: Patient Reason for Call: Refill Medication Summary of Call: [facility] TOLD THE PT  THEY SENT [place] A SCRIPTA WEEK AGO. THE PT SAYS HE HAS  NO REFILL LEFT ON HIS SYNTHROID ( HE TAKE THE GENERIC) NEED THIS TO BE FAX OR CALL IN  TO THEM WITH DOSE AND STRENGTH----PH-[phone]. THE PT SAYS HE WILL BE OUT THIS WEEKEND. NEED THIS DONE TODAY. Initial call taken by: Freddy Jaksch,  February 26, 2010 9:41 AM    New/Updated Medications: SYNTHROID 25 MCG TABS (LEVOTHYROXINE SODIUM) 1 once daily Prescriptions: SYNTHROID 25 MCG TABS (LEVOTHYROXINE SODIUM) 1 once daily  #90 x 0   Entered by:   Shonna Chock CMA   Authorized by:   Marga Melnick MD   Signed by:   Shonna Chock CMA on 02/26/2010   Method used:   Faxed to ...       [facility] (mail-order)             , [place]         Ph: [phone]       Fax: [phone]   RxID:   [id number]

## 2010-06-23 NOTE — Letter (Signed)
Summary: Results Follow-up Letter  [place] [facility]  [address] [place], [place] [address]   Phone: [phone]  Fax: [phone]    11/21/2008        Jeremy Waters [address] [place], [place]  [address]  Dear [name],   The following are the results of your recent test(s):  Test     Result     Pap Smear    Normal_______  Not Normal_____       Comments: _________________________________________________________ Cholesterol LDL(Bad cholesterol):          Your goal is less than:         HDL (Good cholesterol):        Your goal is more than: _________________________________________________________ Other Tests:   _________________________________________________________  Please call for an appointment Or __Please see attached labwork_______________________________________________________ _________________________________________________________ _________________________________________________________  Sincerely,  Ar[place] at [facility]

## 2010-06-23 NOTE — Progress Notes (Signed)
Summary: refill  Phone Note Refill Request Message from:  Fax from Pharmacy on August 01, 2009 12:06 PM  Refills Requested: Medication #1:  CLONAZEPAM 0.5 MG TABS 1 at bedtime as needed [facility] [phone]   Method Requested: Mail to Pharmacy Next Appointment Scheduled: no appt Initial call taken by: Barb Merino,  August 01, 2009 12:08 PM  Follow-up for Phone Call        Last OV 04/25/09(Acute with Melissa), Last OV with Dr.Hopper was 10/29/2008 Follow-up by: Shonna Chock,  August 01, 2009 4:23 PM  Additional Follow-up for Phone Call Additional follow up Details #1::        #90, RX1 Additional Follow-up by: Marga Melnick MD,  August 01, 2009 4:40 PM    Prescriptions: CLONAZEPAM 0.5 MG TABS (CLONAZEPAM) 1 at bedtime as needed  #90 x 1   Entered by:   Jeremy Johann CMA   Authorized by:   Marga Melnick MD   Signed by:   Jeremy Johann CMA on 08/01/2009   Method used:   Printed then faxed to ...       [facility] (mail-order)             , [place]         Ph: [phone]       Fax: [phone]   RxID:   [id number]

## 2010-06-23 NOTE — Assessment & Plan Note (Signed)
Summary: side pain//tl   Vital Signs:  Patient Profile:   65 Years Old Male Weight:      187.13 pounds Pulse rate:   68 / minute Pulse rhythm:   regular BP sitting:   110 / 66  (left arm) Cuff size:   large  Pt. in pain?   no  Vitals Entered By: Wendall Stade (March 03, 2007 8:24 AM)                  Chief Complaint:  left sided chest pressure.  History of Present Illness:  This has been ongoing for a week to 10days,no nausea, appetite good, denies shortness of breath. It is described as pressure in left side of chest when sitting, as if "a lump"; "it feels like my hernia"(inguinal )Resolves with standing  or when supine.No trigger other than position; onset post driving for several hrs.  Current Allergies (reviewed today): ! * NEUROTIN ! * OXYCODONE ! LIPITOR ! * LEVITA ! * VIAGRA ! * CIALIS Updated/Current Medications (including changes made in today's visit):  VYTORIN 10-20 MG  TABS (EZETIMIBE-SIMVASTATIN) 1 by mouth qd * VIT E  * VIT C  * BETA CAROTENE       Review of Systems  General      Denies chills, fever, sweats, and weight loss.  CV      Denies chest pain or discomfort, difficulty breathing at night, difficulty breathing while lying down, fainting, fatigue, leg cramps with exertion, lightheadness, palpitations, shortness of breath with exertion, and swelling of feet.      no exertional cp or DOE  Resp      See HPI      Complains of chest discomfort.      Denies chest pain with inspiration, cough, pleuritic, shortness of breath, sputum productive, and wheezing.  GI      Denies abdominal pain, bloody stools, change in bowel habits, constipation, dark tarry stools, diarrhea, gas, indigestion, loss of appetite, nausea, and vomiting.  GU      Denies dysuria, hematuria, and urinary frequency.   Physical Exam  General:     Well-developed,well-nourished,in no acute distress; alert,appropriate and cooperative throughout examination Mouth:  Mild erythema oropharynx Chest Wall:     No deformities, masses, tenderness or gynecomastia noted.  No pain to compression or percussion Lungs:     Normal respiratory effort, chest expands symmetrically. Lungs are clear to auscultation, no crackles or wheezes. Heart:     normal rate, regular rhythm, no gallop, no rub, no JVD, no HJR, and grade 1 /6 systolic murmur.   Abdomen:     Bowel sounds positive,abdomen soft and non-tender without masses, organomegaly or hernias noted. Pulses:     R and L carotid,radial,dorsalis pedis and posterior tibial pulses are full and equal bilaterally Extremities:     No clubbing, cyanosis, edema, or deformity noted with normal full range of motion of all joints.  Neg Homan's Skin:     Intact without suspicious lesions or rashes Cervical Nodes:     No lymphadenopathy noted Axillary Nodes:     No palpable lymphadenopathy    Impression & Recommendations:  Problem # 1:  CHEST DISCOMFORT (ICD-786.59) Probable hiatal hernia Orders: EKG w/ Interpretation (93000)   Problem # 2:  DIVERTICULOSIS, COLON W/O HEM (ICD-562.10) last colonoscopy 2004 Orders: TLB-CBC Platelet - w/Differential (85025-CBCD)   Complete Medication List: 1)  Vytorin 10-20 Mg Tabs (Ezetimibe-simvastatin) .Marland Kitchen.. 1 by mouth qd 2)  Vit E  3)  Vit  C  4)  Beta Carotene   Other Orders: CXR- 2view (CXR)   Patient Instructions: 1)  Complete stool cards    ]

## 2010-06-23 NOTE — Assessment & Plan Note (Signed)
Summary: sore throat--acute only//tl   Vital Signs:  Patient Profile:   64 Years Old Male Weight:      194.50 pounds Temp:     97.5 degrees F oral Pulse rate:   60 / minute Pulse rhythm:   regular Resp:     15 per minute BP sitting:   108 / 60  (left arm) Cuff size:   large  Vitals Entered By: Wendall Stade (June 26, 2007 5:01 PM)                 Chief Complaint:  still sick.  History of Present Illness: He is having sore throat but only at night and first thing in the am.  The rest of the day seems to be not a problem. There is associated  hoarseness. AsmanexRAD  symptoms controlled with Asmanex 2 inhalations at bedtime, not gargling after   Current Allergies (reviewed today): ! * NEUROTIN ! * OXYCODONE ! LIPITOR ! * LEVITA ! * VIAGRA ! * CIALIS     Review of Systems  General      Denies chills, fever, sweats, and weight loss.  Eyes      Denies discharge, eye pain, and red eye.  ENT      See HPI      Denies ear discharge, earache, nasal congestion, postnasal drainage, and sinus pressure.      He is using Neti pot with response. No facial pain, purulence, or frontal ha  Resp      Denies chest discomfort, cough, and sputum productive.  GI      Denies abdominal pain and indigestion.   Physical Exam  General:     Well-developed,well-nourished,in no acute distress; alert,appropriate and cooperative throughout examination Ears:     Some wax Nose:     Septum to R Mouth:     Marked erythema of pharynx Lungs:     Normal respiratory effort, chest expands symmetrically. Lungs are clear to auscultation, no crackles or wheezes. Cervical Nodes:     No lymphadenopathy noted Axillary Nodes:     No palpable lymphadenopathy    Impression & Recommendations:  Problem # 1:  CANDIDIASIS, ORAL (ICD-112.0)  Complete Medication List: 1)  Vytorin 10-20 Mg Tabs (Ezetimibe-simvastatin) .Marland Kitchen.. 1 by mouth qd 2)  Vit E  3)  Vit C  4)  Beta Carotene     Patient Instructions: 1)  Stop Asmanex; if needed in future gargle well & swallow after it. Take Magic mouthwash three times a day.    ]

## 2010-06-23 NOTE — Letter (Signed)
Summary: [facility]  [facility]   Imported By: Lanelle Bal 12/24/2009 12:18:51  _____________________________________________________________________  External Attachment:    Type:   Image     Comment:   External Document

## 2010-06-23 NOTE — Progress Notes (Signed)
Summary: RX  Phone Note Refill Request   Refills Requested: Medication #1:  SYNTHROID 25 MCG TABS 1 once daily [facility]--[id number]--NEED A NEW SCRIPT FOR 90 DAYS  Initial call taken by: Freddy Jaksch,  August 04, 2009 9:22 AM    New/Updated Medications: SYNTHROID 25 MCG TABS (LEVOTHYROXINE SODIUM) 1 once daily, Additional Refills require an Appointment Prescriptions: SYNTHROID 25 MCG TABS (LEVOTHYROXINE SODIUM) 1 once daily, Additional Refills require an Appointment  #90 x 0   Entered by:   Shonna Chock   Authorized by:   Marga Melnick MD   Signed by:   Shonna Chock on 08/04/2009   Method used:   Faxed to ...       [facility] (mail-order)             , [place]         Ph: [phone]       Fax: [phone]   RxID:   [id number]

## 2010-06-23 NOTE — Letter (Signed)
Summary: Results Follow up Letter  [facility]  [address] [place], [place] [address]   Phone: [phone]  Fax: [phone]    02/05/2008 MRN: [id number]  Jeremy Waters [address] [place], [place]  [address]  Dear [name],  The following are the results of your recent test(s):  Test         Result    Pap Smear:        Normal _____  Not Normal _____ Comments: ______________________________________________________ Cholesterol: LDL(Bad cholesterol):         Your goal is less than:         HDL (Good cholesterol):       Your goal is more than: Comments:  ______________________________________________________ Mammogram:        Normal _____  Not Normal _____ Comments:  ___________________________________________________________________ Hemoccult:        Normal _____  Not normal _______ Comments:    _____________________________________________________________________ Other Tests: PLEASE SEE ATTACHED LABS DONE ON 01/31/2008    We routinely do not discuss normal results over the telephone.  If you desire a copy of the results, or you have any questions about this information we can discuss them at your next office visit.   Sincerely,

## 2010-06-23 NOTE — Letter (Signed)
Summary: E Mail from Patient Regarding Cramps  E Mail from Patient Regarding Cramps   Imported By: Lanelle Bal 01/06/2010 09:22:11  _____________________________________________________________________  External Attachment:    Type:   Image     Comment:   External Document

## 2010-06-25 NOTE — Progress Notes (Signed)
Summary: Lab Results  Phone Note Outgoing Call Call back at Home Phone [phone]   Call placed by: Shonna Chock CMA,  May 15, 2010 11:46 AM Call placed to: Patient Details for Reason: lab results Summary of Call: Left message on voicemail with lab results, patient to call if questions or concerns  Good , no white count elevation, anemia or  abnormal types of cells( mild increase in Monocytes is not significant). Levester Fresh CMA  May 15, 2010 11:48 AM

## 2010-06-25 NOTE — Progress Notes (Signed)
Summary: Continue ABX?  Phone Note Call from Patient Call back at [phone]   Summary of Call: Patient left message on triage that he did receive lab results and would like to know should he continue the ABX that were given or can he stop them since everything was fine. Please advise. Initial call taken by: Lucious Groves CMA,  May 15, 2010 2:37 PM  Follow-up for Phone Call        Left message on voicemail to call back to office.  Per MD, complete ABX. Lucious Groves CMA  May 15, 2010 3:40 PM   Patient notes that he is somewhat better, but not 100%. He was advised, per MD, complete the ABX. Patient expressed understanding. Lucious Groves CMA  May 15, 2010 3:53 PM

## 2010-06-25 NOTE — Progress Notes (Signed)
Summary: Ongoing Neck pain  Phone Note Call from Patient Call back at [phone]   Caller: Spouse Summary of Call: Messag left on triage voicemail: Patient still with neck pain and Flexeril  is not helping that much. Patient is out of town at [employer] and was seen yesterday at Urgent Care, Please call   I called and spoke directly to the patient and informed him he would need to follow-up with the urgent care that he was seen at since he is out of town. I asked patient when he would return here and he indicated Tuesday, I advised we could go ahead and schedule a follow-up with Dr.Hopper and patient indicated he will see how things go and call Tuesday if follow-up needed./Chrae [facility] CMA  May 22, 2010 3:28 PM      Appended Document: Ongoing Neck pain I left message on recorder. Imaging needed if N&T ,pain, or weakness in UE. If not , massage or Chiropractry recommned based on 04/20`11 OV.

## 2010-06-25 NOTE — Progress Notes (Signed)
Summary: REFILL  Phone Note Refill Request Call back at [phone] Message from:  Pharmacy on June 10, 2010 10:29 AM  Refills Requested: Medication #1:  SYNTHROID 25 MCG TABS 1 once daily   Dosage confirmed as above?Dosage Confirmed   Supply Requested: 3 months [facility]  Next Appointment Scheduled: NONE Initial call taken by: Lavell Islam,  June 10, 2010 10:31 AM  Follow-up for Phone Call        RX faxed to [phone] Follow-up by: Shonna Chock CMA,  June 10, 2010 1:25 PM    Prescriptions: SYNTHROID 25 MCG TABS (LEVOTHYROXINE SODIUM) 1 once daily  #90 x 2   Entered by:   Shonna Chock CMA   Authorized by:   Marga Melnick MD   Signed by:   Shonna Chock CMA on 06/10/2010   Method used:   Print then Give to Patient   RxID:   [id number]

## 2010-06-25 NOTE — Progress Notes (Signed)
Summary: Possible reaction to meds  Phone Note Call from Patient   Caller: Patient Call For: Marga Melnick MD Details for Reason: Reaction to Med Summary of Call: Call from patient and he stated he was put on Metronidazole and he started to develop stiffness in his neck and a rash on his chest with some itching that started about 5 days ago but has gradually gotten worst. Denied sleeping in an incorrect position, stated he read the bottle and this is a reaction to this medication and he wanted to know what to do, he can not come into the office because he is in [place] and wants to know if we can call something in or if he will need to be seen out there. I advisd the patient to try Benadryl for the itching and I will call back with reccomendation from Hop...Marland KitchenMarland KitchenC/b # [id number].  Initial call taken by: Almeta Monas CMA Duncan Dull),  May 21, 2010 8:15 AM  Follow-up for Phone Call        Per Dr.Hopper pt is to d/c Metronidazole and use geneic Mometasone 0.1% lotion 30 grams--apply two times a day as needed to rash...... I callled patient advised him of the above, he stated that he went to another facility while he waited for our return call, stated he did not need the Cream right now and asked me not to call it in and the facility that he went to prescribed him a muscle relaxer for his neck.Marland KitchenMarland KitchenI advised I would document. call ended Follow-up by: Almeta Monas CMA Duncan Dull),  May 21, 2010 2:35 PM

## 2010-06-25 NOTE — Assessment & Plan Note (Signed)
Summary: ABDOMINAL PAIN/KN   Vital Signs:  Patient profile:   65 year old male Height:      67.75 inches (172.09 cm) Weight:      179.38 pounds (81.54 kg) BMI:     27.58 Temp:     98.8 degrees F (37.11 degrees C) oral Resp:     15 per minute BP sitting:   120 / 70  (left arm) Cuff size:   regular  Vitals Entered By: Lucious Groves CMA (May 14, 2010 12:38 PM) CC: C/O abd pain for approximately 2 days./kb, Abdominal pain Is Patient Diabetic? No Pain Assessment Patient in pain? yes     Location: abdomen Intensity: 4 Type: ache/cramp Onset of pain  approx. 2 days Comments Patient notes that he has not been having constipation isses, fever, nor rectal bleeding/blood in stool.   CC:  C/O abd pain for approximately 2 days./kb and Abdominal pain.  History of Present Illness: Abdominal Pain      This is a 65 year old man who presents with Abdominal pain for > 24 hr.  The patient denies nausea, vomiting, diarrhea, constipation, melena, hematochezia, anorexia, and hematemesis.  The location of the pain is suprapubic; it does not radiate.  The pain is described as intermittent and cramping in quality.  The patient denies the following symptoms: fever, dysuria,hematuria, jaundice, and dark urine.  The pain is worse with bowel  movements.  The pain has not been treated except not eating last night.PMH of diverticulosis & polyp. Colonscopy up to date.  Current Medications (verified): 1)  Vit E 2)  Vit C 3)  Beta Carotene 4)  Clonazepam 0.5 Mg Tabs (Clonazepam) .Marland Kitchen.. 1 At Bedtime As Needed 5)  Synthroid 25 Mcg Tabs (Levothyroxine Sodium) .Marland Kitchen.. 1 Once Daily 6)  Pravastatin Sodium 20 Mg Tabs (Pravastatin Sodium) .Marland Kitchen.. 1 At Bedtime 7)  Benzonatate 200 Mg Caps (Benzonatate) .Marland Kitchen.. 1 Every 6-8 Hrs As Needed Cough  Allergies (verified): 1)  ! * Neurotin 2)  ! * Oxycodone 3)  ! Lipitor 4)  ! * Levita 5)  ! * Viagra 6)  ! * Cialis  Physical Exam  General:  Uncomfortable but in no  acute  distress; alert,appropriate and cooperative throughout examination Eyes:  No corneal or conjunctival inflammation noted. No icterus Mouth:  Oral mucosa and oropharynx without lesions or exudates.  Teeth in good repair. No pharyngeal erythema.   Lungs:  Normal respiratory effort, chest expands symmetrically. Lungs are clear to auscultation, no crackles or wheezes. Heart:  normal rate, regular rhythm, no gallop, no rub, no JVD, no HJR, and grade1  /6 systolic murmur.   Abdomen:  Bowel sounds positive,abdomen soft  diffusely tender , especially suprapubic area without masses, organomegaly or hernias noted. Genitalia:  Testes bilaterally descended without nodularity, tenderness or masses. No scrotal masses or lesions. No penis lesions or urethral discharge. L varicocele.   Skin:  Intact without suspicious lesions or rashes Inguinal Nodes:  No significant adenopathy Psych:  normally interactive and good eye contact.     Impression & Recommendations:  Problem # 1:  ABDOMINAL PAIN (ICD-789.00)  R/O diverticulitis vs UTI  Orders: Venipuncture (16109) TLB-CBC Platelet - w/Differential (85025-CBCD) TLB-Udip w/ Micro (81001-URINE) T-Culture, Urine ([address]11)  Complete Medication List: 1)  Vit E  2)  Vit C  3)  Beta Carotene  4)  Clonazepam 0.5 Mg Tabs (Clonazepam) .Marland Kitchen.. 1 at bedtime as needed 5)  Synthroid 25 Mcg Tabs (Levothyroxine sodium) .Marland Kitchen.. 1 once daily 6)  Pravastatin Sodium 20 Mg Tabs (Pravastatin sodium) .Marland Kitchen.. 1 at bedtime 7)  Benzonatate 200 Mg Caps (Benzonatate) .Marland Kitchen.. 1 every 6-8 hrs as needed cough 8)  Ciprofloxacin Hcl 500 Mg Tabs (Ciprofloxacin hcl) .Marland Kitchen.. 1 two times a day 9)  Oscimin 0.125 Mg Subl (Hyoscyamine sulfate) .Marland Kitchen.. 1 under tongue every 6 hrs as needed for pain 10)  Metronidazole 500 Mg Tabs (Metronidazole) .Marland Kitchen.. 1 three times a day ; avoid alcohol  Patient Instructions: 1)  Drink clear liquids only for the next 24 hours, then slowly add other liquids and food as you   tolerate them. Prescriptions: METRONIDAZOLE 500 MG TABS (METRONIDAZOLE) 1 three times a day ; avoid alcohol  #21 x 0   Entered and Authorized by:   Marga Melnick MD   Signed by:   Marga Melnick MD on 05/14/2010   Method used:   Faxed to ...       [facility] [phone]* (retail)       [address]       [place], [place]  [address]       Ph: [phone] or [phone]       Fax: [phone]   RxID:   [id number] OSCIMIN 0.125 MG SUBL (HYOSCYAMINE SULFATE) 1 under tongue every 6 hrs as needed for pain  #30 x 0   Entered and Authorized by:   Marga Melnick MD   Signed by:   Marga Melnick MD on 05/14/2010   Method used:   Faxed to ...       [facility] [phone][facility] (retail)       [address]       [place], [place]  [address]       Ph: [phone] or [phone]       Fax: [phone]   RxID:   [id number] CIPROFLOXACIN HCL 500 MG TABS (CIPROFLOXACIN HCL) 1 two times a day  #14 x 0   Entered and Authorized by:   Marga Melnick MD   Signed by:   Marga Melnick MD on 05/14/2010   Method used:   Faxed to ...       [facility] [phone][facility] (retail)       [address]       [place], [place]  [address]       Ph: [phone] or [phone]       Fax: [phone]   RxID:   [id number]    Orders Added: 1)  Est. Patient Level IV [01751] 2)  Venipuncture [02585] 3)  TLB-CBC Platelet - w/Differential [85025-CBCD] 4)  TLB-Udip w/ Micro [81001-URINE] 5)  T-Culture, Urine [[id number]]  Appended Document: ABDOMINAL PAIN/KN   Appended Document: ABDOMINAL PAIN/KN  Laboratory Results   Urine Tests   Date/Time Reported: May 14, 2010 2:24 PM   Routine Urinalysis   Appearance: Clear Glucose: negative   (Normal Range: Negative) Bilirubin: negative   (Normal Range: Negative) Ketone: large (80)   (Normal Range: Negative) Spec. Gravity: <1.005   (Normal Range: 1.003-1.035) Blood: negative   (Normal Range: Negative) pH: 5.0   (Normal Range: 5.0-8.0) Protein: negative   (Normal  Range: Negative) Urobilinogen: negative   (Normal Range: 0-1) Nitrite: negative   (Normal Range: Negative) Leukocyte Esterace: negative   (Normal Range: Negative)    Comments: Floydene Flock  May 14, 2010 2:25 PM

## 2010-08-24 ENCOUNTER — Other Ambulatory Visit: Payer: Self-pay | Admitting: *Deleted

## 2010-08-24 MED ORDER — PRAVASTATIN SODIUM 20 MG PO TABS: 20.0000 mg | ORAL_TABLET | Freq: Every day | ORAL | Status: DC

## 2010-08-24 NOTE — Telephone Encounter (Signed)
Pt wife aware rx sent to pharmacy.

## 2010-08-25 ENCOUNTER — Telehealth: Payer: Self-pay | Admitting: Internal Medicine

## 2010-08-25 NOTE — Telephone Encounter (Signed)
Patient says Dr Alwyn Ren changed him from Vytorin to Pravastatin and gave him a 90 day prescription---(he said to go to [facility] and get it cheaper)---patient says he now needs another 90 prescription plus refills for Pravastatin to [address], [employer], [employer]

## 2010-08-26 MED ORDER — PRAVASTATIN SODIUM 20 MG PO TABS: 20.0000 mg | ORAL_TABLET | Freq: Every day | ORAL | Status: DC

## 2010-08-26 NOTE — Telephone Encounter (Signed)
This med was filled on 08/24/2010 #90 with 2 refills, I reviewed location where rx was sent and it was sent to the wrong pharmacy ([address] [place]) in error. I sent it to the correct pharmacy

## 2010-10-06 NOTE — Assessment & Plan Note (Signed)
[facility]                         GASTROENTEROLOGY OFFICE NOTE   NAME:PLYBONJakobie, Henslee                      MRN:          [id number]  DATE:05/15/2007                            DOB:          06-Mar-1946    REASON FOR CONSULT:  Left chest pain and left upper quadrant abdominal  pain.   HISTORY OF PRESENT ILLNESS:  [name] is a 65 year old white male who  I have seen in the past.  He underwent colonoscopy in November of 2004  due to a father with colon cancer, small internal hemorrhoids and  sigmoid colon diverticulosis was noted.  He relates a 2 month history of  a mild pressure-like sensation at his left costochondral margin.  He was  evaluated recently by Dr. Alwyn Ren, stool Hemoccults were negative.  A CT  scan of the chest and abdomen with contrast showed small low attenuation  lesions within the liver consistent with cysts, the largest in the right  hepatic lobe measured 1.1 centimeters, no other abnormalities were  noted.  He states the symptoms tend to bother him when he is seated, it  is not bothersome when standing or moving.  It has no relation to any  digestive function.  He specifically denies any heartburn, belching,  change in bowel habits, change in his stool caliber, melena,  hematochezia or weight loss.   FAMILY HISTORY:  Remarkable for a father with colon cancer, no other  family members with colon cancer or colon polyps or inflammatory bowel  disease.   PAST MEDICAL HISTORY:  1. Hyperlipidemia.  2. Arthritis.  3. Diverticulosis.  4. Internal hemorrhoids.  5. Status post bilateral inguinal hernia repairs.  6. Status post hemorrhoidectomy.  7. Status post prostatectomy for prostate cancer.  8. Status post L4-L5 diskectomy 1999.   CURRENT MEDICATIONS:  Listed on the chart, updated and reviewed.   MEDICATION ALLERGIES:  NONE KNOWN.   SOCIAL HISTORY:  Per the handwritten form.   REVIEW OF SYSTEMS:  Per the handwritten  form.   PHYSICAL EXAMINATION:  Well-developed, well-nourished, no acute  distress, smell of tobacco smoke is noted.  Height 5 feet 8 inches,  weight 189.4 pounds, blood pressure is 140/90, pulse 64 and regular.  HEENT:  Anicteric sclerae, oropharynx clear.  CHEST:  Clear to auscultation bilaterally.  CARDIAC:  Regular rate and rhythm with a soft, systolic murmur.  ABDOMEN:  Soft and nondistended.  There is minimal focal left upper  quadrant and left costochondral margin tenderness to deep palpation.  No  rebound or guarding.  No palpable organomegaly, masses or hernias.  Normoactive bowel sounds.  EXTREMITIES:  Without clubbing, cyanosis or edema.  NEUROLOGIC:  Alert and oriented x3.  Grossly nonfocal.   ASSESSMENT/PLAN:  1. Focal left costochondral margin pain and left upper quadrant      abdominal pain.  This is likely musculoskeletal.  It is exacerbated      by the seated position and does not have any relationship to any      digestive function.  He is to try Naprosyn twice a day for the next  3-5 days along with the p.r.n. use of a heating pad.  If his      symptoms worsen or do not fully respond he will call for further      followup.  2. Family history of colon cancer in his father.  Recall colonoscopy      recommended for May of 2009.  3. Diverticulosis.  Long term high fiber diet.     Venita Lick. Russella Dar, MD, [facility]  Electronically Signed    MTS/MedQ  DD: 05/15/2007  DT: 05/15/2007  Job #: [id number]   cc:   Titus Dubin. Alwyn Ren, MD,FACP,FCCP

## 2010-10-09 NOTE — Consult Note (Signed)
Jeremy Waters, Jeremy Waters               ACCOUNT NO.:  [id number]   MEDICAL RECORD NO.:  [id number]          PATIENT TYPE:  OIB   LOCATION:  NA                           FACILITY:  [facility]   PHYSICIAN:  Titus Dubin. Alwyn Ren, M.D. [facility] OF BIRTH:  1945/06/22   DATE OF CONSULTATION:  05/11/2004  DATE OF DISCHARGE:                                   CONSULTATION   REASON FOR CONSULTATION:  The patient is a 65 year old white male referred  by Dr. Kerrin Champagne with preoperative clearance.  He is scheduled for back  surgery on May 14, 2004, at [facility] by Dr. Otelia Sergeant.  He describes tingling in the entire right lower extremity upon arising.  As  soon as he begins to ambulate, he has pain in the right buttock and thigh.  The symptoms will resolve overnight, only to recur the next morning.  He has  tried physical therapy, acupuncture and epidural steroids, with no benefit.   PAST SURGICAL HISTORY:  1.  He previously had a laminectomy in 2000 at L4 and L5.  The present      pathology is below this level.  2.  He also had a herniorrhaphy in 1987.  3.  A knee surgery in 1959.  4.  Tonsillectomy in 1971.  5.  Herniorrhaphy in 2005.  6.  He had a colonoscopy in 1999, and in 2004, which revealed      diverticulosis.   PAST MEDICAL HISTORY:  1.  Hypercholesterolemia.  2.  Degenerative joint disease.  3.  Prostatic hypertrophy.   FAMILY HISTORY:  Reveals lung cancer in his mother.  Bladder cancer in his  father.  His father also had prostate cancer and colon cancer and renal  calculi.  A maternal uncle had hypertension and a stroke.   SOCIAL HISTORY:  He smokes cigars occasionally.  He drinks socially.   MEDICATIONS:  1.  Flomax 0.4 mg.  2.  A multivitamin.  3.  Vytorin 10/40.   ALLERGIES:  NEURONTIN caused hives.   REVIEW OF SYSTEMS:  An isolated dyspepsia one week prior to evaluation.  He  did have constipation postoperatively following the laminectomy in 2000,  possibly related to OxyContin.   PHYSICAL EXAMINATION:  VITAL SIGNS:  His weight is up approximately 13  pounds to 184 pounds.  This is related to decreased physical activity due to  the back problem.  Pulse 60 and regular, respirations 14, blood pressure  130/80.  HEART:  An S4 versus a click, was noted at the apex with a grade 0.5 to 1.0  systolic murmur.  This was loudest when supine along the left sternal  border.  The remainder of the cardiopulmonary examination was unremarkable.  ABDOMEN:  He had no organomegaly.  NODES:  No lymphadenopathy.  NEUROLOGIC:  Straight leg raising was negative on the right.  Slightly  positive when the left leg was elevated.  SKIN:  Plaque-like dermatitis was noted at the hair line, particularly the  right occiput, greater than the left.  No dermatographia was noted.  An electrocardiogram was normal and a copy  was given to him to take to the  anesthesiologist.   ASSESSMENT/PLAN:  1.  He now has an S1 to S3 radiculopathy, for which orthopedic surgery is to      be pursued.  2.  For the dermatitis, Elavil samples were provided.  It was recommended      that he use T-Gel shampoo.  3.  A review of the chart indicates that he has had mildly elevated SGOT and      SGPT with values of 42 and 50 respectively in March of this year.      Fasting lipids were recommended along with this.  Labs to be done      preoperatively.  The patient can be seen in the hospital by the [facility],  should medical problems arise.  None are anticipated, in view of the  essentially negative findings, other than the neuromuscular findings as  noted above.       WFH/MEDQ  D:  05/11/2004  T:  05/11/2004  Job:  [id number]

## 2010-10-09 NOTE — Op Note (Signed)
NAME:  Jeremy Waters, Jeremy Waters                         ACCOUNT NO.:  [id number]   MEDICAL RECORD NO.:  [id number]                   PATIENT TYPE:  AMB   LOCATION:  DSC                                  FACILITY:  [facility]   PHYSICIAN:  Velora Heckler, M.D.                DATE OF BIRTH:  1945-09-25   DATE OF PROCEDURE:  DATE OF DISCHARGE:                                 OPERATIVE REPORT   PREOPERATIVE DIAGNOSIS:  Left inguinal hernia.   POSTOPERATIVE DIAGNOSIS:  Left inguinal hernia.   PROCEDURE:  Repair of left inguinal hernia with Prolene mesh.   SURGEON:  Velora Heckler, M.D.   ANESTHESIA:  General per Janetta Hora. Gelene Mink, M.D.   ESTIMATED BLOOD LOSS:  Minimal.   PREPARATION:  Betadine.   COMPLICATIONS:  None.   INDICATIONS:  The patient is a 65 year old white male referred by Dr. Lona Kettle for left inguinal hernia.  This has been present for approximately 1-  1/2 years.  He has had mild discomfort.  He has noted a slight change in his  bowel habits with moderate irregularity and straining at stool.  He has had  a previous right inguinal hernia repaired in 1989.  He now comes to surgery  for repair of left inguinal hernia.   DESCRIPTION OF PROCEDURE:  The procedure was done in OR #3 at the [facility].  The patient was brought to the operating room and placed in  a supine position on the operating table.  Following administration of  general anesthesia, the patient was prepped and draped in the usual strict  aseptic fashion.  After ascertaining that an adequate level of anesthesia  had been obtained, the left inguinal incision was made with a #15 blade.  Dissection was carried down through the subcutaneous tissues.  Hemostasis  was obtained with the electrocautery.  External oblique fascia was incised  in line with its fibers and extended through the external inguinal ring.  Cord structures were encircled with a Penrose drain.  The floor of the  inguinal canal was  dissected out.  Cord was explored.  There was a small  lipoma of the cord which was excised.  There was a moderate indirect  inguinal hernia sac which was dissected out up to the level of the internal  inguinal ring and then a high ligation performed with 2-0 silk suture  ligature and the sac was excised.  Floor of the inguinal canal was then  recreated with a sheet of Prolene mesh cut to the appropriate dimensions.  It was secured to the pubic tubercle and along the inguinal ligament with a  running 2-0 Novafil suture.  Mesh was split to accommodate the cord  structures.  Superior margins of the mesh were secured to the transversalis  and internal oblique fascia with interrupted 2-0 Novafil sutures.  Tails of  the mesh were overlapped lateral to  the cord structures and secured to the  inguinal ligament with an interrupted 2-0 Novafil suture.  A field block was  placed with local Marcaine.  Cord structures were returned to the inguinal  canal.  The external oblique fascia was closed with interrupted 3-0 Vicryl  sutures.  Subcutaneous tissues were closed with interrupted 3-0 Vicryl  sutures.  Skin was anesthetized with local anesthetic.  Skin was closed with  running 4-0  Vicryl subcuticular suture.  Wound was washed and dried and Steri-Strips  were applied.  Sterile dressings were applied.  The patient was awakened  from anesthesia and brought to the recovery room in stable condition.  The  patient tolerated the procedure well.                                               Velora Heckler, M.D.    TMG/MEDQ  D:  10/31/2003  T:  10/31/2003  Job:  [id number]   cc:   Titus Dubin. Alwyn Ren, M.D. [facility]

## 2010-10-09 NOTE — Op Note (Signed)
NAMERIK, WADEL               ACCOUNT NO.:  [id number]   MEDICAL RECORD NO.:  [id number]          PATIENT TYPE:  OIB   LOCATION:  2899                         FACILITY:  [facility]   PHYSICIAN:  Kerrin Champagne, M.D.   DATE OF BIRTH:  04-Jul-1945   DATE OF PROCEDURE:  05/14/2004  DATE OF DISCHARGE:                                 OPERATIVE REPORT   PREOPERATIVE DIAGNOSIS:  Right L5-S1 lateral recess stenosis with herniated  nucleus pulposus.   POSTOPERATIVE DIAGNOSIS:  Right L5-S1 herniated nucleus pulposus with right-  sided S1 nerve compression.   OPERATION PERFORMED:  Right L5-S1 microdiskectomy.   SURGEON:  Kerrin Champagne, M.D.   ASSISTANT:  Wende Neighbors, P.A.   ANESTHESIA:  GOT.  Dr. Gypsy Balsam.  Supplemented with local infiltration with  Marcaine 0.5% with 1:200,000 epinephrine 5 mL.   ESTIMATED BLOOD LOSS:  Less than 20 mL.   INDICATIONS FOR PROCEDURE:  The patient is a 65 year old male who has been  experiencing back pain with radiation to the right leg.  This has been  persistent and consistent since the time of his initial presentation in May  of this last year.  His MR studies demonstrated a persistent posterior disk  protrusion, L5 to S1 with right-sided S1 nerve root compression.  His study  has demonstrated the above.  The patient had rather unimpressive sciatic  tension tests.  However, he has been experiencing numbness in a right S1  distribution since the time of beginning of his discomfort this past spring.  He underwent Medrol Dose-Pack.  He has had conservative management with an  exercise program; he has had persistent problems with neurogenic  claudication and difficulty standing for any length of time due to numbness  in the right leg and pain into the right posterior thigh, calf and plantar  foot, consistent with right S1 radiculopathy that is persisting and almost  in a neurogenic claudication picture or lateral recess stenosis picture.  He  underwent  further study which demonstrated protruded disk L5-S1.  He was  brought to the operating room after undergoing prolonged conservative  management without relief of pain with increasing difficulty standing for  any length of time secondary to S1 radiculopathy.   INTRAOPERATIVE FINDINGS:  Moderately large protruded disk central and right  side affecting the right S1 nerve root, the S1 nerve root tethered by  apparent veins in the lateral recess region along the medial aspect of the  S1 pedicle.  Underwent foraminotomy over the S1 nerve root and lateral  recess decompression and mobilization of the sac and the S1 nerve root prior  to excision of the disk.   DESCRIPTION OF PROCEDURE:  After adequate general anesthesia the patient in  knee chest position Andrews frame standard preoperative antibiotics,  standard prep with DuraPrep solution, draped in the usual manner.  Two  spinal needles placed at the upper and lower ends of previous incision scar,  lower needle at the L5-S1 level.  Skin marked and incision made into the  skin and subcutaneous areas carried down to the spinous process at L5.  Clamp placed on the spinous process at L5 and a second intraoperative  radiograph demonstrated clamp on the L5 spinous process.  Two Cobbs used to  elevate the paralumbar muscles on the right side incising them off of the  spinous process of S1 and L5.  Exposing the posterior aspect of the  interlaminar space at the L5-S1 level.  McCullough retractor inserted.  Under loupe magnification and head lamp, the inferior aspect of the lamina  on the right side at L5 was carefully resected up to the attachment of the  ligamentum flavum over the inferior ventral aspect of this region.  This was  then lifted using a nerve hook and 2 and 3 mm Kerrison used to excise the  ligamentum flavum on the right side.  This was carried down to the sacral  level where it was excised off the superior aspect of the lamina of S1  and  then continued out laterally performing a foraminotomy over the right S1  nerve root and decompressing the medial aspect of the L5-S1 facet on the  right side over about 15%.   Lateral aspect of the thecal sac and S1 nerve roots identified.  The S1  nerve root carefully mobilized and small epidural veins cauterized using  bipolar electrocautery.  The operating room microscope draped and brought  into the field sterilely under the operating room microscope, then the  lateral aspect of the thecal sac and S1 nerve root were further mobilized.  The medial facet underwent resection of hypertrophic ligamentum flavum.  With the nerve root and thecal sac mobilized, disk herniation was evident on  the right side.  Longitudinal incision was made into the disk through the  posterior longitudinal ligament and immediately disk material extruded  through the incised posterior longitudinal ligament.  A nerve hook was then  used to carefully free the subligamentous disk protruded here and excise it.  Pituitary rongeur was then used to excise further subligamentous disk  material, then entering the disk space further loose degenerative disk  material was excised until no further fragments were noted.  Irrigation was  performed upbiting and downbiting pituitary rongeurs used to debride the  disk space.   Hockey stick neural probe then able to be passed out the S1 foramen both  ventral and posterior to the S1 nerve root and out the L5 neural foramen  demonstrating patency here.  It was passed over the posterior aspect of the  disk space demonstrating there was some very minimal retrolisthesis of L5 on  S1 but no further disk material remained protruded over the posterior aspect  of the disk space on the right side L5-S1.  This irrigation was performed.  Bone wax applied to bleeding cancellous bone surfaces.  Excess bone wax removed.  Small portion of Thrombin soaked Gelfoam used to carefully obtain   hemostasis of the epidurals over the lateral spinal canal.  After further  irrigation, the soft tissue was allowed to fall back into place.  No active  bleeding was evident.  Lumbodorsal fascia reapproximated in the midline with  interrupted UR6 0 Vicryl sutures.  The deep subcutaneous layer  reapproximated with interrupted 0 Vicryl sutures, more superficial layers  with interrupted 2-0 Vicryl suture and the skin closed with a running  subcutaneous stitch of 4-0 Vicryl.  Tincture of benzoin and Steri-Strips  applied.  4 x 4s fixed to the skin with HypaFix tape.  Patient then was  returned to a supine position, reactivated, extubated and returned  to  recovery room in satisfactory condition.  All instrument and sponge counts  were correct.      Jame   JEN/MEDQ  D:  05/14/2004  T:  05/15/2004  Job:  [id number]

## 2010-12-07 ENCOUNTER — Emergency Department (HOSPITAL_COMMUNITY)
Admission: EM | Admit: 2010-12-07 | Discharge: 2010-12-08 | Discharge status: Against Medical Advice | Payer: Managed Care, Other (non HMO) | Attending: Emergency Medicine | Admitting: Emergency Medicine

## 2010-12-07 ENCOUNTER — Emergency Department (HOSPITAL_COMMUNITY): Payer: Managed Care, Other (non HMO)

## 2010-12-07 DIAGNOSIS — R079 Chest pain, unspecified: Secondary | ICD-10-CM | POA: Insufficient documentation

## 2010-12-07 DIAGNOSIS — F17200 Nicotine dependence, unspecified, uncomplicated: Secondary | ICD-10-CM | POA: Insufficient documentation

## 2010-12-07 DIAGNOSIS — E785 Hyperlipidemia, unspecified: Secondary | ICD-10-CM | POA: Insufficient documentation

## 2010-12-07 DIAGNOSIS — E039 Hypothyroidism, unspecified: Secondary | ICD-10-CM | POA: Insufficient documentation

## 2010-12-07 LAB — BASIC METABOLIC PANEL
BUN: 18 mg/dL (ref 6–23)
CO2: 25 mEq/L (ref 19–32)
Calcium: 9.7 mg/dL (ref 8.4–10.5)
Chloride: 101 mEq/L (ref 96–112)
Creatinine, Ser: 0.65 mg/dL (ref 0.50–1.35)
GFR calc Af Amer: >60 mL/min (ref >60)
GFR calc non Af Amer: >60 mL/min (ref >60)
Glucose, Bld: 86 mg/dL (ref 70–99)
Potassium: 4.2 mEq/L (ref 3.5–5.1)
Sodium: 138 mEq/L (ref 135–145)

## 2010-12-07 LAB — DIFFERENTIAL
Basophils Absolute: 0 K/uL (ref 0.0–0.1)
Basophils Relative: 1 % (ref 0–1)
Eosinophils Absolute: 0.3 K/uL (ref 0.0–0.7)
Eosinophils Relative: 4 % (ref 0–5)
Lymphocytes Relative: 34 % (ref 12–46)
Lymphs Abs: 2.7 K/uL (ref 0.7–4.0)
Monocytes Absolute: 1.1 K/uL — ABNORMAL HIGH (ref 0.1–1.0)
Monocytes Relative: 13 % — ABNORMAL HIGH (ref 3–12)
Neutro Abs: 4 K/uL (ref 1.7–7.7)
Neutrophils Relative %: 49 % (ref 43–77)

## 2010-12-07 LAB — CBC
HCT: 44 % (ref 39.0–52.0)
Hemoglobin: 15.8 g/dL (ref 13.0–17.0)
MCH: 34.2 pg — ABNORMAL HIGH (ref 26.0–34.0)
MCHC: 35.9 g/dL (ref 30.0–36.0)
MCV: 95.2 fL (ref 78.0–100.0)
Platelets: 236 10*3/uL (ref 150–400)
RBC: 4.62 MIL/uL (ref 4.22–5.81)
RDW: 13.8 % (ref 11.5–15.5)
WBC: 8.2 10*3/uL (ref 4.0–10.5)

## 2010-12-07 LAB — TROPONIN I: Troponin I: <0.3 ng/mL (ref <0.30)

## 2010-12-07 LAB — CK TOTAL AND CKMB (NOT AT ARMC)
CK, MB: 1.7 ng/mL (ref 0.3–4.0)
Relative Index: 1.1 (ref 0.0–2.5)
Total CK: 155 U/L (ref 7–232)

## 2010-12-08 ENCOUNTER — Telehealth: Payer: Self-pay | Admitting: Internal Medicine

## 2010-12-08 NOTE — Telephone Encounter (Signed)
Dr Oletta Lamas from ER called: the pt was seen for atypical CP and left home. He needs a f/u w/Dr Alwyn Ren this week, please. Thanks! AP

## 2010-12-18 ENCOUNTER — Ambulatory Visit (INDEPENDENT_AMBULATORY_CARE_PROVIDER_SITE_OTHER): Payer: Managed Care, Other (non HMO) | Admitting: Internal Medicine

## 2010-12-18 ENCOUNTER — Encounter: Payer: Self-pay | Admitting: Internal Medicine

## 2010-12-18 VITALS — BP 122/70 | HR 64 | Temp 98.3°F | Wt 174.4 lb

## 2010-12-18 DIAGNOSIS — K219 Gastro-esophageal reflux disease without esophagitis: Secondary | ICD-10-CM

## 2010-12-18 MED ORDER — RANITIDINE HCL 150 MG PO TABS: 150.0000 mg | ORAL_TABLET | Freq: Two times a day (BID) | ORAL | Status: DC

## 2010-12-18 NOTE — Progress Notes (Signed)
  Subjective:    Patient ID: Jeremy Waters, male    DOB: [birth year], 65 y.o.   MRN: [id number]  HPI Jeremy Waters  was seen in the emergency room 12/07/2010 4 for chest pain. Those records were reviewed. Cardiac enzymes were negative. Since the ER visit, he's had no additional episodes of chest pain.  Significantly he's been treated by Dr. Teressa Senter, hand specialist, for tendon injury at the right elbow. He been taking Aleve up to 2 pills twice a day for 2-3 weeks.  He denies abdominal pain, melena, rectal bleeding, or dysphasia.  He runs 2-3 miles a day on an elliptical trainer without any chest pain, palpitations, or dyspnea.    Review of Systems     Objective:   Physical Exam he is healthy-appearing and in no distress.  There is minimal erythema of the uvula but no oral pharyngeal erythema  He has no scleral icterus or jaundice  Chest is clear to auscultation with no rhonchi, rales, or wheezes.  He has an S4 and a grade 1/2-1 systolic murmur at the base  Abdomen is nontender; it is no organomegaly or masses          Assessment & Plan:  #1 chest pain related to reflux induced by nonsteroidal agents  Plan: I would recommend ranitidine 150 mg pre breakfast and evening meal for 6-8 weeks for optimal healing of any associated gastritis. Dyspepsia preventive measures were discussed.   No problem-specific assessment & plan notes found for this encounter.

## 2010-12-18 NOTE — Patient Instructions (Signed)
The triggers for dyspepsia or "heart burn"  include stress; the "aspirin family" ; alcohol; peppermint; and caffeine (coffee, tea, cola, and chocolate). The aspirin family would include aspirin and the nonsteroidal agents such as ibuprofen &  Naproxen. Tylenol would not cause reflux. If having dyspepsia ; food & drink should be avoided for @ least 2 hours before going to bed.  

## 2010-12-23 ENCOUNTER — Encounter: Payer: Self-pay | Admitting: Internal Medicine

## 2010-12-28 ENCOUNTER — Ambulatory Visit (INDEPENDENT_AMBULATORY_CARE_PROVIDER_SITE_OTHER): Payer: Managed Care, Other (non HMO) | Admitting: Internal Medicine

## 2010-12-28 ENCOUNTER — Encounter: Payer: Self-pay | Admitting: Internal Medicine

## 2010-12-28 DIAGNOSIS — J069 Acute upper respiratory infection, unspecified: Secondary | ICD-10-CM | POA: Insufficient documentation

## 2010-12-28 NOTE — Progress Notes (Signed)
  Subjective:    Patient ID: Jeremy Waters, male    DOB: April 26, 1946, 65 y.o.   MRN: [id number]  HPI Symptoms started yesterday with a sore throat, feeling rundown, sinus congestion.  PMH and PSH reviewed    Review of Systems Denies any fever or ear ache No nausea vomiting, mild diarrhea without blood in the stools. Denies any headaches, chest congestion, cough per se. Sinuses feel congested but  not hurting.    Objective:   Physical Exam  Constitutional: He appears well-developed and well-nourished. No distress.  HENT:  Head: Normocephalic and atraumatic.  Right Ear: External ear normal.  Left Ear: External ear normal.  Mouth/Throat: No oropharyngeal exudate.       Nose is slightly congested  Cardiovascular: Normal rate, regular rhythm and normal heart sounds.   No murmur heard. Pulmonary/Chest: Effort normal and breath sounds normal. No respiratory distress. He has no wheezes. He has no rales.  Skin: He is not diaphoretic.          Assessment & Plan:

## 2010-12-28 NOTE — Assessment & Plan Note (Signed)
Symptoms consistent with URI, see instructions

## 2010-12-28 NOTE — Patient Instructions (Signed)
Rest, fluids , tylenol If  cough, take Mucinex DM twice a day as needed  Take claritin OTC 10mg  a day Also astelin 2 sprays on each side of the nose at night until samples gone Call if no better in few days Call anytime if the symptoms are severe, you have high fever

## 2011-01-18 ENCOUNTER — Encounter: Payer: Self-pay | Admitting: Internal Medicine

## 2011-01-18 ENCOUNTER — Ambulatory Visit (INDEPENDENT_AMBULATORY_CARE_PROVIDER_SITE_OTHER): Payer: Managed Care, Other (non HMO) | Admitting: Internal Medicine

## 2011-01-18 VITALS — BP 130/82 | HR 57 | Temp 98.4°F | Wt 175.4 lb

## 2011-01-18 DIAGNOSIS — L03818 Cellulitis of other sites: Secondary | ICD-10-CM

## 2011-01-18 DIAGNOSIS — L0390 Cellulitis, unspecified: Secondary | ICD-10-CM

## 2011-01-18 DIAGNOSIS — L02818 Cutaneous abscess of other sites: Secondary | ICD-10-CM

## 2011-01-18 MED ORDER — DOXYCYCLINE HYCLATE 100 MG PO TABS: 100.0000 mg | ORAL_TABLET | Freq: Two times a day (BID) | ORAL | Status: AC

## 2011-01-18 NOTE — Patient Instructions (Signed)
Plans the right axilla with Hibiclens 1-2 times a day. Do not use deodorant  until this clears. Avoid prolonged direct sun exposure while on the doxycycline. If the lesion forms a frank pustule; it would be optimal to culture any  drainage.

## 2011-01-18 NOTE — Progress Notes (Signed)
  Subjective:    Patient ID: Jeremy Waters, male    DOB: 08-Nov-1945, 65 y.o.   MRN: [id number]  HPI RASH: Location: in R axillae  Onset: as SQ knots felt in shower 2 weeks ago   Course: elevation of lesions with tenderness Self-treated with: nothing              History Pruritis: yes, minimally  New medications/antibiotics: no  Tick/insect/pet exposure: no definite exposure  Recent travel: no  New detergent, new clothing, or other topical exposure: no   Red Flags Feeling ill: no  Fever: no  Mouth lesions: no No PMH of MRSA   Review of Systems     Objective:   Physical Exam he appears well and is in no acute distress.  He has no lymphadenopathy about the neck or left axilla  There are several reddish papules in the right axilla without pustule formation. There is no definite lymphadenopathy in this area. The left axilla is clear. He has no organomegaly or masses.        Assessment & Plan:  #1 low-grade cellulitis with papule formation, right axilla.  Plan: See orders and recommendations.

## 2011-03-08 ENCOUNTER — Other Ambulatory Visit: Payer: Self-pay | Admitting: Internal Medicine

## 2011-03-08 MED ORDER — LEVOTHYROXINE SODIUM 25 MCG PO TABS: 25.0000 ug | ORAL_TABLET | Freq: Every day | ORAL | Status: DC

## 2011-03-08 NOTE — Telephone Encounter (Signed)
RX sent

## 2011-03-16 ENCOUNTER — Ambulatory Visit (INDEPENDENT_AMBULATORY_CARE_PROVIDER_SITE_OTHER): Payer: Managed Care, Other (non HMO) | Admitting: Internal Medicine

## 2011-03-16 DIAGNOSIS — L988 Other specified disorders of the skin and subcutaneous tissue: Secondary | ICD-10-CM

## 2011-03-16 DIAGNOSIS — R238 Other skin changes: Secondary | ICD-10-CM

## 2011-03-16 DIAGNOSIS — Z23 Encounter for immunization: Secondary | ICD-10-CM

## 2011-03-16 DIAGNOSIS — T887XXA Unspecified adverse effect of drug or medicament, initial encounter: Secondary | ICD-10-CM

## 2011-03-16 LAB — CK: Total CK: 102 U/L (ref 7–232)

## 2011-03-16 MED ORDER — MUPIROCIN 2 % EX OINT: TOPICAL_OINTMENT | CUTANEOUS | Status: AC

## 2011-03-16 NOTE — Progress Notes (Signed)
Addended by: Edgardo Roys on: 03/16/2011 02:38 PM   Modules accepted: Orders

## 2011-03-16 NOTE — Patient Instructions (Signed)
Continue Hibiclens twice a day and then apply the antibiotic ointment until seen by the surgeon.

## 2011-03-16 NOTE — Progress Notes (Signed)
  Subjective:    Patient ID: Jeremy Waters, male    DOB: 14-Aug-1945, 65 y.o.   MRN: [id number]  HPI Lesion: Location: R axilla  Onset: 2nd week 8/12   Course: more tender over past week Improvement with treatment: no better with Hibiclens cleansing & Doxycycline History: Pruritis: no  Red Flags Feeling ill: no  Fever: no  His wife has lymph node in L inguinal; no FH or PMH MRSA  Diabetic or immunocompromised: no       Review of Systems  He has recently had isolated joint and hamstring injuries. He questioned muscle issues related to statins or other medications. By history this is not described true myalgias.     Objective:   Physical Exam   He is well-nourished and in no acute distress  He has no lymphadenopathy about the head, neck, or axilla. There are subcutaneous lesions in the right axillary area. One has a denuded surface and a central eschar. This measures 1 x 0.5 cm. Proximal to this lesion is a Foley subcutaneous lesion 0.5 x 0.5 cm.  There are no other significant skin lesions.  He has no organomegaly or masses        Assessment & Plan:  #1 subcutaneous lesions in the right axilla; these have not resolved with doxycycline and Hibiclens. There is no evidence of active cellulitis or abscess formation. Definitive diagnosis would require local excision; there is no lesion which is able to be incised and drained and cultured.  #2 musculoskeletal symptoms without true myalgia. CK will be checked although myalgia related symptoms from low-dose pravastatin would be unexpected.

## 2011-03-18 ENCOUNTER — Ambulatory Visit (INDEPENDENT_AMBULATORY_CARE_PROVIDER_SITE_OTHER): Payer: Managed Care, Other (non HMO)

## 2011-03-18 DIAGNOSIS — Z23 Encounter for immunization: Secondary | ICD-10-CM

## 2011-03-18 DIAGNOSIS — Z2911 Encounter for prophylactic immunotherapy for respiratory syncytial virus (RSV): Secondary | ICD-10-CM

## 2011-04-07 ENCOUNTER — Encounter (INDEPENDENT_AMBULATORY_CARE_PROVIDER_SITE_OTHER): Payer: Self-pay | Admitting: Surgery

## 2011-04-07 ENCOUNTER — Ambulatory Visit (INDEPENDENT_AMBULATORY_CARE_PROVIDER_SITE_OTHER): Payer: Managed Care, Other (non HMO) | Admitting: Surgery

## 2011-04-07 VITALS — BP 120/68 | HR 68 | Temp 97.4°F | Resp 14 | Ht 67.75 in | Wt 176.2 lb

## 2011-04-07 DIAGNOSIS — R229 Localized swelling, mass and lump, unspecified: Secondary | ICD-10-CM

## 2011-04-07 DIAGNOSIS — R2230 Localized swelling, mass and lump, unspecified upper limb: Secondary | ICD-10-CM | POA: Insufficient documentation

## 2011-04-07 NOTE — Progress Notes (Signed)
Chief Complaint  Patient presents with  . Mass    right axillary masses - referral by Dr. William Hopper    HISTORY: Patient is a 65-year-old white male known to my practice from having undergone previous inguinal hernia repair. Patient has been followed for the last 2-3 months with "lumps" under the right axilla.  Patient was evaluated by his primary care physician. He has been treated with oral antibiotics and with topical cleansing with Hibiclens. Lesions have improved but not resolved. He presents today for consideration for excisional biopsy.  Patient has not noted any similar lesions anywhere else on his torso. He denies any lymphadenopathy. He denies any drainage from the lesions.   Past Medical History  Diagnosis Date  . Diverticulosis of colon     w/o hemorrage  . Hyperlipidemia   . Prostate cancer   . Colon polyp   . Inguinal hernia     left side      Current Outpatient Prescriptions  Medication Sig Dispense Refill  . mupirocin (BACTROBAN) 2 % ointment Daily.      . beta carotene 30 MG capsule Take 30 mg by mouth daily.        . clonazePAM (KLONOPIN) 0.5 MG tablet Take 0.5 mg by mouth at bedtime as needed.        . levothyroxine (SYNTHROID, LEVOTHROID) 25 MCG tablet Take 1 tablet (25 mcg total) by mouth daily.  90 tablet  0  . pravastatin (PRAVACHOL) 20 MG tablet Take 1 tablet (20 mg total) by mouth daily.  90 tablet  2  . vitamin C (ASCORBIC ACID) 500 MG tablet Take 500 mg by mouth daily.        . vitamin E 400 UNIT capsule Take 400 Units by mouth daily.           Allergies  Allergen Reactions  . Atorvastatin     Increased LFTs  . Sildenafil     Heart burn  . Tadalafil     Heart burn     Family History  Problem Relation Age of Onset  . Hypertension Paternal Uncle   . Alcohol abuse Paternal Uncle   . Stroke Paternal Uncle 50  . Lung cancer Mother   . Cancer Father     Bladder Cancer  . Prostate cancer Father   . Colon cancer Father   . Urolithiasis  Father   . Lung cancer Sister      History   Social History  . Marital Status: Married    Spouse Name: N/A    Number of Children: N/A  . Years of Education: N/A   Occupational History  . insur exe    Social History Main Topics  . Smoking status: Current Some Day Smoker    Types: Cigars  . Smokeless tobacco: Never Used   Comment: stopped smoking cigarettes 30 years ago as of 2012  . Alcohol Use: Yes     scotch or wine daily  . Drug Use: No  . Sexually Active: None   Other Topics Concern  . None   Social History Narrative   No diet     REVIEW OF SYSTEMS - PERTINENT POSITIVES ONLY: No drainage. No abscess formation.   EXAM: Filed Vitals:   04/07/11 0915  BP: 120/68  Pulse: 68  Temp: 97.4 F (36.3 C)  Resp: 14    HEENT: normocephalic; pupils equal and reactive; sclerae clear; dentition good; mucous membranes moist NECK:  symmetric on extension; no palpable anterior or posterior   cervical lymphadenopathy; no supraclavicular masses; no tenderness CHEST: clear to auscultation bilaterally without rales, rhonchi, or wheezes CARDIAC: regular rate and rhythm without significant murmur; peripheral pulses are full AXILLA: Right axilla with small 4 mm ulcerated lesion on medial aspect of upper arm; no fluctuence EXT:  non-tender without edema; no deformity NEURO: no gross focal deficits; no sign of tremor   LABORATORY RESULTS: See E-Chart for most recent results   RADIOLOGY RESULTS: See E-Chart or I-Site for most recent results   IMPRESSION: The small nodular mass with superficial ulceration and right axilla of undetermined significance, less than 1 cm in size   PLAN: The patient and I discussed options for management. Both the patient and his primary physician favor excisional biopsy. I think this can be performed under local anesthesia as an outpatient procedure. We will make arrangements for this to be done in the minor procedure room in the near future at a  time convenient for the patient.  The risks and benefits of the procedure have been discussed at length with the patient.  The patient understands the proposed procedure, potential alternative treatments, and the course of recovery to be expected.  All of the patient's questions have been answered at this time.  The patient wishes to proceed with surgery and will schedule a date for their procedure through our office staff.   Ryliee Figge M. Cougar Imel, MD, FACS General & Endocrine Surgery [facility]    Visit Diagnoses: 1. Axillary mass, right     Primary Care Physician: William Hopper, MD, MD   

## 2011-04-22 ENCOUNTER — Encounter (HOSPITAL_BASED_OUTPATIENT_CLINIC_OR_DEPARTMENT_OTHER): Payer: Self-pay | Admitting: *Deleted

## 2011-04-22 NOTE — Progress Notes (Signed)
Pt posted as local.Will confirm with MD office and contact pt with information and possible reschedule at another center.

## 2011-04-26 ENCOUNTER — Encounter (HOSPITAL_BASED_OUTPATIENT_CLINIC_OR_DEPARTMENT_OTHER)
Admission: RE | Disposition: A | Discharge status: Home / Self Care | Payer: Self-pay | Source: Ambulatory Visit | Attending: Surgery

## 2011-04-26 ENCOUNTER — Ambulatory Visit (HOSPITAL_BASED_OUTPATIENT_CLINIC_OR_DEPARTMENT_OTHER)
Admission: RE | Admit: 2011-04-26 | Discharge: 2011-04-26 | Disposition: A | Discharge status: Home / Self Care | Payer: Managed Care, Other (non HMO) | Source: Ambulatory Visit | Attending: Surgery | Admitting: Surgery

## 2011-04-26 ENCOUNTER — Encounter (HOSPITAL_BASED_OUTPATIENT_CLINIC_OR_DEPARTMENT_OTHER): Payer: Self-pay | Admitting: *Deleted

## 2011-04-26 ENCOUNTER — Other Ambulatory Visit (INDEPENDENT_AMBULATORY_CARE_PROVIDER_SITE_OTHER): Payer: Self-pay | Admitting: Surgery

## 2011-04-26 ENCOUNTER — Encounter (HOSPITAL_BASED_OUTPATIENT_CLINIC_OR_DEPARTMENT_OTHER): Admission: RE | Payer: Self-pay | Source: Ambulatory Visit

## 2011-04-26 ENCOUNTER — Ambulatory Visit (HOSPITAL_BASED_OUTPATIENT_CLINIC_OR_DEPARTMENT_OTHER): Admission: RE | Admit: 2011-04-26 | Payer: Managed Care, Other (non HMO) | Source: Ambulatory Visit | Admitting: Surgery

## 2011-04-26 DIAGNOSIS — R2230 Localized swelling, mass and lump, unspecified upper limb: Secondary | ICD-10-CM

## 2011-04-26 DIAGNOSIS — R229 Localized swelling, mass and lump, unspecified: Secondary | ICD-10-CM

## 2011-04-26 DIAGNOSIS — L905 Scar conditions and fibrosis of skin: Secondary | ICD-10-CM | POA: Insufficient documentation

## 2011-04-26 HISTORY — PX: MASS EXCISION: SHX2000

## 2011-04-26 SURGERY — EXCISION MASS
Anesthesia: Monitor Anesthesia Care | Site: Shoulder | Laterality: Right

## 2011-04-26 SURGERY — MINOR EXCISION OF MASS
Anesthesia: LOCAL | Site: Axilla | Laterality: Right | Wound class: Clean

## 2011-04-26 MED ORDER — BUPIVACAINE-EPINEPHRINE PF 0.5-1:200000 % IJ SOLN
INTRAMUSCULAR | Status: DC | PRN
  Administered 2011-04-26: 10 mL

## 2011-04-26 SURGICAL SUPPLY — 30 items
APL SKNCLS STERI-STRIP NONHPOA (GAUZE/BANDAGES/DRESSINGS)
BANDAGE ADHESIVE 1X3 (GAUZE/BANDAGES/DRESSINGS) IMPLANT
BENZOIN TINCTURE PRP APPL 2/3 (GAUZE/BANDAGES/DRESSINGS) ×1 IMPLANT
BLADE SURG 15 STRL LF DISP TIS (BLADE) ×1 IMPLANT
BLADE SURG 15 STRL SS (BLADE) ×2
BLADE SURG ROTATE 9660 (MISCELLANEOUS) ×1 IMPLANT
CAUTERY EYE LOW TEMP 1300F FIN (OPHTHALMIC RELATED) IMPLANT
CLOTH BEACON ORANGE TIMEOUT ST (SAFETY) ×1 IMPLANT
DRAPE EENT ADH APERT 31X51 STR (DRAPES) ×1 IMPLANT
DRAPE UTILITY XL STRL (DRAPES) ×1 IMPLANT
ELECT REM PT RETURN 9FT ADLT (ELECTROSURGICAL)
ELECTRODE REM PT RTRN 9FT ADLT (ELECTROSURGICAL) IMPLANT
GAUZE SPONGE 4X4 12PLY STRL LF (GAUZE/BANDAGES/DRESSINGS) IMPLANT
GLOVE BIO SURGEON STRL SZ 6.5 (GLOVE) ×1 IMPLANT
GLOVE SURG ORTHO 8.0 STRL STRW (GLOVE) ×2 IMPLANT
MARKER SKIN DUAL TIP RULER LAB (MISCELLANEOUS) ×1 IMPLANT
NDL HYPO 25X1 1.5 SAFETY (NEEDLE) ×1 IMPLANT
NEEDLE HYPO 25X1 1.5 SAFETY (NEEDLE) ×2 IMPLANT
PENCIL BUTTON HOLSTER BLD 10FT (ELECTRODE) IMPLANT
SPONGE GAUZE 2X2 8PLY STRL LF (GAUZE/BANDAGES/DRESSINGS) IMPLANT
STRIP CLOSURE SKIN 1/2X4 (GAUZE/BANDAGES/DRESSINGS) ×1 IMPLANT
SUT ETHILON 3 0 PS 1 (SUTURE) IMPLANT
SUT ETHILON 4 0 CL P 3 (SUTURE) ×1 IMPLANT
SUT VIC AB 4-0 P-3 18XBRD (SUTURE) IMPLANT
SUT VIC AB 4-0 P3 18 (SUTURE)
SUT VICRYL 3-0 CR8 SH (SUTURE) IMPLANT
SUT VICRYL 4-0 PS2 18IN ABS (SUTURE) IMPLANT
SWABSTICK POVIDONE IODINE SNGL (MISCELLANEOUS) ×4 IMPLANT
SYR CONTROL 10ML LL (SYRINGE) ×2 IMPLANT
TOWEL OR NON WOVEN STRL DISP B (DISPOSABLE) ×1 IMPLANT

## 2011-04-26 NOTE — Interval H&P Note (Signed)
History and Physical Interval Note:  04/26/2011 9:19 AM  Jeremy Waters  has presented today for surgery, with the diagnosis of right axillary mass / cutaneous lesion. The various methods of treatment have been discussed with the patient and family. After consideration of risks, benefits and other options for treatment, the patient has consented to:  Procedure(s):  MINOR EXCISION OF MASS as a surgical intervention.    The patients' history has been reviewed, patient examined, no change in status, stable for surgery.  I have reviewed the patients' chart and labs.  Questions were answered to the patient's satisfaction.    Velora Heckler, MD, FACS General & Endocrine Surgery [facility]   Stella Encarnacion Judie Petit

## 2011-04-26 NOTE — Op Note (Signed)
Pre op dx:  Mass right axilla  Post op dx:  Same  Procedure:  Excision skin and subcutaneous tissue right axilla (1.0 cm)  Surgeon:   Velora Heckler, MD, FACS  Anes:  0.5% Marcaine with epi, 10cc  EBL:  None  Prep:  Betadine  Indications:  Small mass right axilla, waxes and wanes in size  Procedure:  Patient is brought to OR #2 at the [facility].  Pt is placed supine with right arm extended.  Skin is prepped with betadine.  Skin is anesthetized with Marcaine into the skin and subcutaneous tissues.  Lesion is excised sharply taking an elipse of skin and underlying subcutaneous tissue measuring about 1.0 cm.  Hemostasis by direct pressure.   Skin is closed with 4-0 nylon simple sutures.  Antibiotic ointment is applied with a dry gauze dressing.  The pt tolerated the procedure well.  Velora Heckler, MD, FACS General & Endocrine Surgery [facility]

## 2011-04-26 NOTE — H&P (View-Only) (Signed)
Chief Complaint  Patient presents with  . Mass    right axillary masses - referral by Dr. Marga Melnick    HISTORY: Patient is a 65 year old white male known to my practice from having undergone previous inguinal hernia repair. Patient has been followed for the last 2-3 months with "lumps" under the right axilla.  Patient was evaluated by his primary care physician. He has been treated with oral antibiotics and with topical cleansing with Hibiclens. Lesions have improved but not resolved. He presents today for consideration for excisional biopsy.  Patient has not noted any similar lesions anywhere else on his torso. He denies any lymphadenopathy. He denies any drainage from the lesions.   Past Medical History  Diagnosis Date  . Diverticulosis of colon     w/o hemorrage  . Hyperlipidemia   . Prostate cancer   . Colon polyp   . Inguinal hernia     left side      Current Outpatient Prescriptions  Medication Sig Dispense Refill  . mupirocin (BACTROBAN) 2 % ointment Daily.      . beta carotene 30 MG capsule Take 30 mg by mouth daily.        . clonazePAM (KLONOPIN) 0.5 MG tablet Take 0.5 mg by mouth at bedtime as needed.        Marland Kitchen levothyroxine (SYNTHROID, LEVOTHROID) 25 MCG tablet Take 1 tablet (25 mcg total) by mouth daily.  90 tablet  0  . pravastatin (PRAVACHOL) 20 MG tablet Take 1 tablet (20 mg total) by mouth daily.  90 tablet  2  . vitamin C (ASCORBIC ACID) 500 MG tablet Take 500 mg by mouth daily.        . vitamin E 400 UNIT capsule Take 400 Units by mouth daily.           Allergies  Allergen Reactions  . Atorvastatin     Increased LFTs  . Sildenafil     Heart burn  . Tadalafil     Heart burn     Family History  Problem Relation Age of Onset  . Hypertension Paternal Uncle   . Alcohol abuse Paternal Uncle   . Stroke Paternal Uncle 62  . Lung cancer Mother   . Cancer Father     Bladder Cancer  . Prostate cancer Father   . Colon cancer Father   . Urolithiasis  Father   . Lung cancer Sister      History   Social History  . Marital Status: Married    Spouse Name: N/A    Number of Children: N/A  . Years of Education: N/A   Occupational History  . insur exe    Social History Main Topics  . Smoking status: Current Some Day Smoker    Types: Cigars  . Smokeless tobacco: Never Used   Comment: stopped smoking cigarettes 30 years ago as of 2012  . Alcohol Use: Yes     scotch or wine daily  . Drug Use: No  . Sexually Active: None   Other Topics Concern  . None   Social History Narrative   No diet     REVIEW OF SYSTEMS - PERTINENT POSITIVES ONLY: No drainage. No abscess formation.   EXAM: Filed Vitals:   04/07/11 0915  BP: 120/68  Pulse: 68  Temp: 97.4 F (36.3 C)  Resp: 14    HEENT: normocephalic; pupils equal and reactive; sclerae clear; dentition good; mucous membranes moist NECK:  symmetric on extension; no palpable anterior or posterior  cervical lymphadenopathy; no supraclavicular masses; no tenderness CHEST: clear to auscultation bilaterally without rales, rhonchi, or wheezes CARDIAC: regular rate and rhythm without significant murmur; peripheral pulses are full AXILLA: Right axilla with small 4 mm ulcerated lesion on medial aspect of upper arm; no fluctuence EXT:  non-tender without edema; no deformity NEURO: no gross focal deficits; no sign of tremor   LABORATORY RESULTS: See E-Chart for most recent results   RADIOLOGY RESULTS: See E-Chart or I-Site for most recent results   IMPRESSION: The small nodular mass with superficial ulceration and right axilla of undetermined significance, less than 1 cm in size   PLAN: The patient and I discussed options for management. Both the patient and his primary physician favor excisional biopsy. I think this can be performed under local anesthesia as an outpatient procedure. We will make arrangements for this to be done in the minor procedure room in the near future at a  time convenient for the patient.  The risks and benefits of the procedure have been discussed at length with the patient.  The patient understands the proposed procedure, potential alternative treatments, and the course of recovery to be expected.  All of the patient's questions have been answered at this time.  The patient wishes to proceed with surgery and will schedule a date for their procedure through our office staff.   Velora Heckler, MD, FACS General & Endocrine Surgery [facility]    Visit Diagnoses: 1. Axillary mass, right     Primary Care Physician: Marga Melnick, MD, MD

## 2011-04-27 ENCOUNTER — Encounter (HOSPITAL_BASED_OUTPATIENT_CLINIC_OR_DEPARTMENT_OTHER): Payer: Self-pay

## 2011-04-27 ENCOUNTER — Telehealth (INDEPENDENT_AMBULATORY_CARE_PROVIDER_SITE_OTHER): Payer: Self-pay

## 2011-04-27 NOTE — Telephone Encounter (Signed)
done

## 2011-04-27 NOTE — Progress Notes (Signed)
Quick Note:  Please contact patient with benign path results. TMG ______ 

## 2011-04-27 NOTE — Progress Notes (Signed)
Path results and post op appointment information left with wife "Corrie Dandy".  Patient went our to hit golf balls. I asked the wife to  please tell patient to take it easy.

## 2011-04-30 ENCOUNTER — Encounter (HOSPITAL_BASED_OUTPATIENT_CLINIC_OR_DEPARTMENT_OTHER): Payer: Self-pay | Admitting: Surgery

## 2011-05-05 ENCOUNTER — Ambulatory Visit (INDEPENDENT_AMBULATORY_CARE_PROVIDER_SITE_OTHER): Payer: Managed Care, Other (non HMO) | Admitting: Surgery

## 2011-05-05 ENCOUNTER — Encounter (INDEPENDENT_AMBULATORY_CARE_PROVIDER_SITE_OTHER): Payer: Self-pay | Admitting: Surgery

## 2011-05-05 VITALS — BP 132/76 | HR 70 | Temp 97.8°F | Resp 18 | Ht 68.0 in | Wt 175.4 lb

## 2011-05-05 DIAGNOSIS — R229 Localized swelling, mass and lump, unspecified: Secondary | ICD-10-CM

## 2011-05-05 DIAGNOSIS — R2230 Localized swelling, mass and lump, unspecified upper limb: Secondary | ICD-10-CM

## 2011-05-05 NOTE — Progress Notes (Signed)
Visit Diagnoses: 1. Axillary mass, right     HISTORY: Patient returns for a postop wound check and suture removal after excision of a small cutaneous lesion from the right axilla. Final pathology shows a granulomatous inflammatory process without any evidence of malignancy.  EXAM: Surgical wound is well healed. 5 nylon sutures are removed. A Steri-Strip was placed as dressing. There is no sign of infection.  IMPRESSION: Small inflammatory lesion right axilla, fully excised, no evidence of malignancy  PLAN: Local wound care instructions are given. Patient will return as needed.   Velora Heckler, MD, FACS General & Endocrine Surgery [facility]

## 2011-05-05 NOTE — Patient Instructions (Signed)
Leave steristrip 4-5 days.  May shower.  tmg

## 2011-05-13 ENCOUNTER — Encounter: Payer: Self-pay | Admitting: Internal Medicine

## 2011-05-13 ENCOUNTER — Ambulatory Visit (INDEPENDENT_AMBULATORY_CARE_PROVIDER_SITE_OTHER): Payer: Managed Care, Other (non HMO) | Admitting: Internal Medicine

## 2011-05-13 VITALS — BP 124/62 | HR 67 | Temp 98.3°F | Wt 178.0 lb

## 2011-05-13 DIAGNOSIS — B369 Superficial mycosis, unspecified: Secondary | ICD-10-CM

## 2011-05-13 DIAGNOSIS — J209 Acute bronchitis, unspecified: Secondary | ICD-10-CM

## 2011-05-13 MED ORDER — HYDROCODONE-HOMATROPINE 5-1.5 MG/5ML PO SYRP: 5.0000 mL | ORAL_SOLUTION | Freq: Four times a day (QID) | ORAL | Status: AC | PRN

## 2011-05-13 MED ORDER — AZITHROMYCIN 250 MG PO TABS: ORAL_TABLET | ORAL | Status: AC

## 2011-05-13 NOTE — Patient Instructions (Signed)
After showering  use a hairdryer to blow the toe rash dry after Nizoral application. Also do this after any activities which cause sweating. Change socks repeatedly through the day if the feet do sweat. Tennis shoes or other foot wear which appear somewhat moldy should be discarded.

## 2011-05-13 NOTE — Progress Notes (Signed)
  Subjective:    Patient ID: Jeremy Waters, male    DOB: 10/06/1945, 65 y.o.   MRN: [id number]  HPI #1 He has had a vague discomfort under the third, fourth and fifth R  toes for 2 weeks. He describes it as if there were a pencil placed under the toes. Today  he has had similar symptoms in the  the left same toes. He has been soaking the feet and also spray and then with Tinactin.  #2Respiratory tract infection Onset/symptoms:head & chest congestion 12/16 Exposures (illness/environmental/extrinsic):sick grandchildren Progression of symptoms:stable Treatments/response:Robitussin DM , Tylenol PM with partial benefit Present symptoms: Fever/chills/sweats:no Frontal headache:no Facial pain:no Nasal purulence:no Sore throat:minor earlier this week Dental pain:no Lymphadenopathy:no Wheezing/shortness of breath:no Cough/sputum/hemoptysis:brown sputum intermittently Associated extrinsic/allergic symptoms:itchy eyes/ sneezing:no Smoking history:occasional cigar now           Review of Systems     Objective:   Physical Exam     General appearance is of good health and nourishment; no acute distress or increased work of breathing is present.  No  lymphadenopathy about the head, neck, or axilla noted.   Eyes: No conjunctival inflammation or lid edema is present. .  Ears:  External ear exam shows no significant lesions or deformities.  Otoscopic examination reveals clear canals, tympanic membranes are intact bilaterally without bulging, retraction, inflammation or discharge. Hearing aids bilaterally  Nose:  External nasal examination shows no deformity or inflammation. Nasal mucosa are slightly boggy without lesions or exudates. Mild R  septal deviation.No obstruction to airflow.   Oral exam: Dental hygiene is good; lips and gums are healthy appearing.There is no oropharyngeal erythema or exudate noted.    Heart:  Normal rate and regular rhythm. S1 and S2 normal without gallop,  murmur, click, rub or other extra sounds.   Lungs:Chest clear to auscultation; no wheezes, rhonchi,rales ,or rubs present.No increased work of breathing.     Skin: Warm & dry     There are some mild chronic toenail changes without frank fungal infection. There is a mild candidiasis dermatitis between and under the third, fourth, and fifth toes bilaterally.        Assessment & Plan:    #1 fungal skin changes of the ventral third, fourth, fifth toes bilaterally. Nizoral will be prescribed topically; blow dry after application. He should keep the feet as dry as possible.  #2 bronchitis. See orders

## 2011-05-14 ENCOUNTER — Encounter: Payer: Self-pay | Admitting: Internal Medicine

## 2011-05-26 ENCOUNTER — Telehealth: Payer: Self-pay

## 2011-05-26 MED ORDER — CEFUROXIME AXETIL 500 MG PO TABS: 500.0000 mg | ORAL_TABLET | Freq: Two times a day (BID) | ORAL | Status: DC

## 2011-05-26 NOTE — Telephone Encounter (Signed)
Message left on voicemail: Patient finished ABX and cough med, patient still with symptoms. Please advise  Spoke with patient: Patient was seen on 05/13/2011, patient with cough(productive-yellow/brown) and congestion. Patient states he is somewhat better but still with symptoms and would like to know what more to do. Patient informed I will discuss with MD and contact him back with recommendation   Dr.Hopper please advise

## 2011-05-26 NOTE — Telephone Encounter (Signed)
Left message informing patient rx sent in and gave Dr.Hopper's recommendations if no better

## 2011-05-26 NOTE — Telephone Encounter (Signed)
Cefuroxime 500 mg 1 twice a day dispense 14. Chest x-ray and office visit if no better.

## 2011-05-31 ENCOUNTER — Other Ambulatory Visit: Payer: Self-pay | Admitting: Internal Medicine

## 2011-06-01 ENCOUNTER — Other Ambulatory Visit: Payer: Self-pay

## 2011-06-01 ENCOUNTER — Other Ambulatory Visit: Payer: Self-pay | Admitting: Internal Medicine

## 2011-06-01 MED ORDER — LEVOTHYROXINE SODIUM 25 MCG PO TABS: ORAL_TABLET | ORAL | Status: DC

## 2011-06-01 NOTE — Telephone Encounter (Signed)
TSH 244.9 

## 2011-06-01 NOTE — Telephone Encounter (Signed)
Lipid/Hep 272.4/995.20  

## 2011-06-01 NOTE — Telephone Encounter (Signed)
It won't let me send this? It keeps printing so I can't do it from [facility]

## 2011-06-09 ENCOUNTER — Other Ambulatory Visit: Payer: Self-pay | Admitting: Internal Medicine

## 2011-06-09 DIAGNOSIS — T887XXA Unspecified adverse effect of drug or medicament, initial encounter: Secondary | ICD-10-CM

## 2011-06-09 DIAGNOSIS — E785 Hyperlipidemia, unspecified: Secondary | ICD-10-CM

## 2011-06-10 ENCOUNTER — Other Ambulatory Visit (INDEPENDENT_AMBULATORY_CARE_PROVIDER_SITE_OTHER): Payer: Managed Care, Other (non HMO)

## 2011-06-10 DIAGNOSIS — T887XXA Unspecified adverse effect of drug or medicament, initial encounter: Secondary | ICD-10-CM

## 2011-06-10 DIAGNOSIS — E785 Hyperlipidemia, unspecified: Secondary | ICD-10-CM

## 2011-06-10 LAB — LIPID PANEL
Cholesterol: 247 mg/dL — ABNORMAL HIGH (ref 0–200)
HDL: 53 mg/dL (ref >39.00)
Total CHOL/HDL Ratio: 5
Triglycerides: 192 mg/dL — ABNORMAL HIGH (ref 0.0–149.0)
VLDL: 38.4 mg/dL (ref 0.0–40.0)

## 2011-06-10 LAB — HEPATIC FUNCTION PANEL
ALT: 17 U/L (ref 0–53)
AST: 19 U/L (ref 0–37)
Albumin: 4 g/dL (ref 3.5–5.2)
Alkaline Phosphatase: 52 U/L (ref 39–117)
Bilirubin, Direct: 0 mg/dL (ref 0.0–0.3)
Total Bilirubin: 0.3 mg/dL (ref 0.3–1.2)
Total Protein: 6.9 g/dL (ref 6.0–8.3)

## 2011-06-10 LAB — LDL CHOLESTEROL, DIRECT: Direct LDL: 163.4 mg/dL

## 2011-09-02 ENCOUNTER — Other Ambulatory Visit: Payer: Self-pay | Admitting: Internal Medicine

## 2011-09-15 ENCOUNTER — Encounter: Payer: Self-pay | Admitting: Internal Medicine

## 2011-09-15 ENCOUNTER — Ambulatory Visit (INDEPENDENT_AMBULATORY_CARE_PROVIDER_SITE_OTHER): Payer: Managed Care, Other (non HMO) | Admitting: Internal Medicine

## 2011-09-15 VITALS — BP 124/80 | HR 62 | Temp 98.2°F | Wt 177.6 lb

## 2011-09-15 DIAGNOSIS — H00039 Abscess of eyelid unspecified eye, unspecified eyelid: Secondary | ICD-10-CM

## 2011-09-15 MED ORDER — ERYTHROMYCIN 5 MG/GM OP OINT: TOPICAL_OINTMENT | OPHTHALMIC | Status: DC

## 2011-09-15 NOTE — Patient Instructions (Signed)
.  Share record with Dr Randon Goldsmith office

## 2011-09-15 NOTE — Progress Notes (Signed)
  Subjective:    Patient ID: Jeremy Waters, male    DOB: [birth year], 66 y.o.   MRN: [id number]  HPI He noted itching in the right eye 09/13/11 followed by some swelling, especially in the mornings. The eye has been tender to touch but there is no frank pain. He denies any purulent discharge from the eye. He also has not had blurred vision, double vision, or loss of vision.  He's been using water gavage to cleanse the eye.      Review of Systems He denies significant extrinsic symptoms of itchy eyes and sneezing. He has had not had frontal headaches, facial pain, otic pain, or nasal purulence  He has had no fever, chills, sweats     Objective:   Physical Exam General appearance:good health ;well nourished; no acute distress or increased work of breathing is present.  No  lymphadenopathy about the head, neck, or axilla noted.   Eyes: No conjunctival inflammation is present; there is no suggestion of scleritis or iritis. There is a small stye with surrounding erythema of the right upper lid medially. Extraocular motion is intact. Vision is normal  Ears:  External ear exam shows no significant lesions or deformities.  Otoscopic examination reveals clear canals, tympanic membranes are intact bilaterally without bulging, retraction, inflammation or discharge. Aids bilaterally  Nose:  External nasal examination shows no deformity or inflammation. Nasal mucosa are pink and moist without lesions or exudates. No septal dislocation or deviation.No obstruction to airflow.   Oral exam: Dental hygiene is good; lips and gums are healthy appearing.There is mild  oropharyngeal erythema but no  exudate    Neck:  No deformities,  masses, or tenderness noted.   Supple with full range of motion without pain.     Skin: Warm & dry ; see OD lid          Assessment & Plan:   #1 cellulitis/sty right eye upper lid. He may have some corneal abrasion which may or may not be  unrelated to this.  Plan:  Topical erythromycin ointment with Ophthalmology referral to rule out corneal lesion/abrasion.

## 2011-09-17 ENCOUNTER — Other Ambulatory Visit: Payer: Self-pay | Admitting: Internal Medicine

## 2011-09-28 ENCOUNTER — Other Ambulatory Visit: Payer: Self-pay | Admitting: Dermatology

## 2011-12-23 ENCOUNTER — Other Ambulatory Visit: Payer: Self-pay | Admitting: Internal Medicine

## 2011-12-23 NOTE — Telephone Encounter (Signed)
TSH 244.8 

## 2012-03-03 ENCOUNTER — Ambulatory Visit (INDEPENDENT_AMBULATORY_CARE_PROVIDER_SITE_OTHER): Payer: Self-pay | Admitting: Internal Medicine

## 2012-03-03 ENCOUNTER — Encounter: Payer: Self-pay | Admitting: Internal Medicine

## 2012-03-03 VITALS — BP 118/72 | HR 80 | Temp 98.1°F | Resp 16 | Ht 68.0 in | Wt 180.0 lb

## 2012-03-03 DIAGNOSIS — E039 Hypothyroidism, unspecified: Secondary | ICD-10-CM | POA: Insufficient documentation

## 2012-03-03 DIAGNOSIS — J209 Acute bronchitis, unspecified: Secondary | ICD-10-CM

## 2012-03-03 DIAGNOSIS — Z23 Encounter for immunization: Secondary | ICD-10-CM

## 2012-03-03 DIAGNOSIS — J069 Acute upper respiratory infection, unspecified: Secondary | ICD-10-CM

## 2012-03-03 LAB — TSH: TSH: 2.26 u[IU]/mL (ref 0.35–5.50)

## 2012-03-03 LAB — POCT INFLUENZA A/B
Influenza A, POC: NEGATIVE
Influenza B, POC: NEGATIVE

## 2012-03-03 MED ORDER — HYDROCODONE-HOMATROPINE 5-1.5 MG/5ML PO SYRP: 5.0000 mL | ORAL_SOLUTION | Freq: Four times a day (QID) | ORAL | Status: DC | PRN

## 2012-03-03 MED ORDER — AZITHROMYCIN 250 MG PO TABS: ORAL_TABLET | ORAL | Status: DC

## 2012-03-03 NOTE — Patient Instructions (Addendum)
Plain Mucinex for thick secretions ;force NON dairy fluids . Use a Neti pot daily as needed for sinus congestion; going from open side to congested side . Nasal cleansing in the shower as discussed. Make sure that all residual soap is removed to prevent irritation.  Plain Allegra 160 daily as needed for itchy eyes & sneezing.    

## 2012-03-03 NOTE — Progress Notes (Signed)
  Subjective:    Patient ID: Jeremy Waters, male    DOB: 08-Jul-1945, 66 y.o.   MRN: [id number]  HPI   Symptoms began approximately 2 weeks ago as head congestion. Very rapidly this progressed to involve his chest. He developed a cough which was mainly dry but intermittently productive.  Symptoms failed to respond Echinacea; a Robitussin night time product caused urinary frequency.  Last night he developed a significant sore throat. He has some dull frontal headache without associated facial pain or significant nasal purulence.  He describes some chilling without fever or sweats. He questions flu as he has arthralgias and myalgias    Review of Systems He denies shortness of breath or wheezing.  He denies dysuria, pyuria, or hematuria. He describes some minor burning on the Robitussin.     Objective:   Physical Exam General appearance:well nourished; no acute distress or increased work of breathing is present.  No  lymphadenopathy about the head, neck, or axilla noted.   Eyes: No conjunctival inflammation or lid edema is present.   Ears:  External ear exam shows no significant lesions or deformities.  Otoscopic examination reveals wax on R,L tympanic membrane intact bilaterally without bulging, retraction, inflammation or discharge.  Nose:  External nasal examination shows no deformity or inflammation. Nasal mucosa are pink and moist without lesions or exudates. No septal dislocation or deviation.No obstruction to airflow.   Oral exam: Dental hygiene is good; lips and gums are healthy appearing.There is no oropharyngeal erythema or exudate noted. Hoarse  Neck:  No deformities, masses, or tenderness noted.     Heart:  Normal rate and regular rhythm. S1 and S2 normal without gallop, click, rub or other extra sounds. Grade 1/6 systolic murmur   Lungs:Chest clear to auscultation; no wheezes, rhonchi,rales ,or rubs present.No increased work of breathing.    Extremities:  No cyanosis,  edema, or clubbing  noted    Skin: Warm & dry           Assessment & Plan:  #1 acute bronchitis w/o bronchospasm #2 URI; R/O flu Plan: See orders and recommendations

## 2012-03-09 ENCOUNTER — Telehealth: Payer: Self-pay | Admitting: Internal Medicine

## 2012-03-09 DIAGNOSIS — J209 Acute bronchitis, unspecified: Secondary | ICD-10-CM

## 2012-03-09 MED ORDER — AMOXICILLIN 500 MG PO CAPS: 1000.0000 mg | ORAL_CAPSULE | Freq: Two times a day (BID) | ORAL | Status: DC

## 2012-03-09 NOTE — Telephone Encounter (Signed)
Caller: Devone/Patient;PCP: Marga Melnick; Best Callback Phone Number: [phone], patient calling following office visit, prescription for cough syrup and antibiotic was given, diagnosed with virus,  symtpoms have returned: congestion, cough, afebrile. Guideline: URI, Disposition: See within 2 weeks for continued symptoms, caller requests refill of both medications (advised refills of antibiotcs usually not called in) . uses [facility].

## 2012-03-09 NOTE — Telephone Encounter (Signed)
Recommend a second round of antibiotics: Amoxicillin 500 mg 2 tablets twice a day #28. Mucinex DM over-the-counter as needed Okay to call additional 120 cc of the cough syrup if pt request. Office visit if not improving by next week ER or urgent care if symptoms are worse, high fever, shortness of breath or chest pain.

## 2012-03-09 NOTE — Telephone Encounter (Signed)
Left detailed msg on pts vmail & sent in abx.

## 2012-03-22 ENCOUNTER — Other Ambulatory Visit: Payer: Self-pay

## 2012-03-22 MED ORDER — LEVOTHYROXINE SODIUM 25 MCG PO TABS: 25.0000 ug | ORAL_TABLET | Freq: Every day | ORAL | Status: DC

## 2012-03-22 NOTE — Telephone Encounter (Signed)
Pt called left message on triage line stating wanted levothyroxine Rx sent to [facility]. I called pt on cell to advise Rx sent to [facility] per pt request.    MW

## 2012-05-13 ENCOUNTER — Other Ambulatory Visit: Payer: Self-pay | Admitting: Internal Medicine

## 2012-05-15 NOTE — Telephone Encounter (Signed)
Patient needs to schedule a CPX  

## 2012-05-16 ENCOUNTER — Ambulatory Visit (INDEPENDENT_AMBULATORY_CARE_PROVIDER_SITE_OTHER): Payer: BC Managed Care – PPO | Admitting: Internal Medicine

## 2012-05-16 ENCOUNTER — Encounter: Payer: Self-pay | Admitting: Internal Medicine

## 2012-05-16 VITALS — BP 124/78 | HR 92 | Temp 97.8°F | Wt 185.0 lb

## 2012-05-16 DIAGNOSIS — H00019 Hordeolum externum unspecified eye, unspecified eyelid: Secondary | ICD-10-CM

## 2012-05-16 DIAGNOSIS — R059 Cough, unspecified: Secondary | ICD-10-CM

## 2012-05-16 DIAGNOSIS — R05 Cough: Secondary | ICD-10-CM

## 2012-05-16 MED ORDER — HYDROCODONE-HOMATROPINE 5-1.5 MG/5ML PO SYRP: 5.0000 mL | ORAL_SOLUTION | Freq: Four times a day (QID) | ORAL | Status: DC | PRN

## 2012-05-16 NOTE — Patient Instructions (Addendum)
Plain Mucinex for thick secretions ;force NON dairy fluids . Use a Neti pot daily as needed for sinus congestion; going from open side to congested side . Nasal cleansing in the shower as discussed. Make sure that all residual soap is removed to prevent irritation. Nasonex 1 spray in each nostril twice a day as needed. Use the "crossover" technique as discussed. Plain Allegra 160 daily as needed for itchy eyes & sneezing.    

## 2012-05-16 NOTE — Progress Notes (Signed)
  Subjective:    Patient ID: Jeremy Waters, male    DOB: November 10, 1945, 66 y.o.   MRN: [id number]  HPI Symptoms began 05/13/12 with sneezing, head congestion, and nonproductive cough. He is concerned because his wife has had protracted bronchitis.   On 12/22 he felt he may have had some chilling and sweats through the night. He denies significant fever.  He's also developed a stye of the left eye with associated discomfort. Dr Lovell Sheehan has Rxed Doxycycline & Tobradex.    Review of Systems He has not had frontal headache, facial pain, or nasal purulence. There's been no associated shortness of breath or wheezing.  He's had no blurred vision, double vision or loss of vision.     Objective:   Physical Exam General appearance:good health ;well nourished; no acute distress or increased work of breathing is present.  No  lymphadenopathy about the head, neck, or axilla noted.   Eyes: No conjunctival inflammation . Sty OS upper lid. EOM & vision intact  Ears:  External ear exam shows no significant lesions or deformities.  Otoscopic examination reveals clear canals, tympanic membranes are intact bilaterally without bulging, retraction, inflammation or discharge.  Nose:  External nasal examination shows no deformity or inflammation. Nasal mucosa are dry without lesions or exudates. Rightward septal  deviation.No obstruction to airflow.   Oral exam: Dental hygiene is good; lips and gums are healthy appearing.There is no oropharyngeal erythema or exudate noted.  Neck:  No deformities,  masses, or tenderness noted.     Heart:  Normal rate and regular rhythm. S1 and S2 normal without gallop, murmur, click, rub .S4    Lungs:Chest clear to auscultation; no wheezes, rhonchi,rales ,or rubs present.No increased work of breathing.    Extremities:  No cyanosis, edema, or clubbing  noted    Skin: Warm & dry           Assessment & Plan:  #1 sty #2cough, viral etiology clinically Plan: See  orders and recommendations

## 2012-06-19 ENCOUNTER — Ambulatory Visit: Payer: BC Managed Care – PPO | Admitting: Internal Medicine

## 2012-06-19 ENCOUNTER — Telehealth: Payer: Self-pay | Admitting: Internal Medicine

## 2012-06-19 NOTE — Telephone Encounter (Signed)
This call was sent to RN by CSR. When RN got the call the patient had hung up the phone.  RN attempted to call patient back and left a message. ( The call reason documented was SOB and pt refused to call 911).  RN did not speak with pt.

## 2012-06-19 NOTE — Telephone Encounter (Signed)
Left message with patient's wife for patient to return call when available

## 2012-06-19 NOTE — Telephone Encounter (Signed)
Patient called to speak with triage nurse, patient with URI: congestions and SOB related to cough. Patient to be seen today at 3:45 with Dr.Paz

## 2012-06-20 ENCOUNTER — Encounter: Payer: Self-pay | Admitting: Internal Medicine

## 2012-06-20 ENCOUNTER — Ambulatory Visit (INDEPENDENT_AMBULATORY_CARE_PROVIDER_SITE_OTHER)
Admission: RE | Admit: 2012-06-20 | Discharge: 2012-06-20 | Disposition: A | Discharge status: Home / Self Care | Payer: BC Managed Care – PPO | Source: Ambulatory Visit | Attending: Internal Medicine | Admitting: Internal Medicine

## 2012-06-20 ENCOUNTER — Ambulatory Visit (INDEPENDENT_AMBULATORY_CARE_PROVIDER_SITE_OTHER): Payer: BC Managed Care – PPO | Admitting: Internal Medicine

## 2012-06-20 VITALS — BP 118/80 | HR 65 | Temp 97.9°F | Wt 188.0 lb

## 2012-06-20 DIAGNOSIS — R000 Tachycardia, unspecified: Secondary | ICD-10-CM

## 2012-06-20 DIAGNOSIS — R0609 Other forms of dyspnea: Secondary | ICD-10-CM

## 2012-06-20 DIAGNOSIS — R0989 Other specified symptoms and signs involving the circulatory and respiratory systems: Secondary | ICD-10-CM

## 2012-06-20 DIAGNOSIS — E785 Hyperlipidemia, unspecified: Secondary | ICD-10-CM

## 2012-06-20 DIAGNOSIS — R0600 Dyspnea, unspecified: Secondary | ICD-10-CM

## 2012-06-20 LAB — CK TOTAL AND CKMB (NOT AT ARMC)
CK, MB: 0.8 ng/mL (ref 0.3–4.0)
Total CK: 73 U/L (ref 7–232)

## 2012-06-20 LAB — D-DIMER, QUANTITATIVE: D-Dimer, Quant: 0.44 ug/mL-FEU (ref 0.00–0.48)

## 2012-06-20 LAB — TROPONIN I: Troponin I: 0.02 ng/mL (ref <0.06)

## 2012-06-20 LAB — BRAIN NATRIURETIC PEPTIDE: Pro B Natriuretic peptide (BNP): 30 pg/mL (ref 0.0–100.0)

## 2012-06-20 MED ORDER — CLONAZEPAM 0.5 MG PO TABS: 0.5000 mg | ORAL_TABLET | Freq: Every evening | ORAL | Status: DC | PRN

## 2012-06-20 NOTE — Progress Notes (Signed)
  Subjective:    Patient ID: Jeremy Waters, male    DOB: 12-11-1945, 67 y.o.   MRN: [id number]  HPI  He has had intermittent dyspnea for 3 weeks initially  in context of flying from [place]. Symptoms characterized as  PND upon awakening with tachycardia & panic sensation. This has recurred 2-3 x since. Dyspnea improved with ambulation. Similar symptoms 2009 ; Clonazepam helped then.  No past medical history of cardiopulmonary disease No significant environmental or occupational exposures Smoking history began @ age 17 , smoked up to 1-2  ppd  Until 1983. Non smoker 1983-2001; 1 cigar /day until 3 mos ago.  Family history of cardiopulmonary disease: mother COPD , lung cancer          Review of Systems Constitutional: No  fever, chills, significant weight change,other sleep dysfunction, fatigue, night sweats Cardiovascular: No chest pain, palpitations,  irregularity, syncope, diaphoresis, claudication Respiratory:  Minor cough productive of scant yellow phlegm . No hemoptysis, pleurisy, hoarseness GI: Increased reflux which responds to Pepcid. No dysphagia, melena, rectal bleeding Endocrine: No skin/hair/nail changes Neuro/Muscular: No weakness, tremor, gait dysfunction Heme/lymph: No abnormal bruising, lymphadenopathy Allergy/immunologic: Some sneezing with head congestion; no angioedema         Objective:   Physical Exam General appearance:well nourished; no acute distress or increased work of breathing is present.  No  lymphadenopathy about the head, neck, or axilla noted.   Eyes: No conjunctival inflammation or lid edema is present. There is no scleral icterus.  Ears:  External ear exam shows no significant lesions or deformities.  Otoscopic examination reveals clear canals, tympanic membranes are intact bilaterally without bulging, retraction, inflammation or discharge. Hearing aids bilaterally  Nose:  External nasal examination shows no deformity or inflammation. Nasal  mucosa are pink and moist without lesions or exudates. R septal  deviation.No obstruction to airflow.   Oral exam: Dental hygiene is good; lips and gums are healthy appearing.There is no oropharyngeal erythema or exudate noted.   Neck:  No deformities, thyromegaly, masses, or tenderness noted.  No NVD at 15 Heart:  Normal rate and regular rhythm. S1 and S2 normal without gallop,  click, rub or other extra sounds. Grade 1/6 systolic murmur   Lungs:Chest clear to auscultation; no wheezes, rhonchi,rales ,or rubs present.No increased work of breathing.    Bowel sounds are normal. Abdomen is soft and nontender with no organomegaly, hernias  or masses. No HJR  Extremities:  No cyanosis, edema, or clubbing  noted . Homans sign is negative bilaterally   Vasc:All pulses intact . R > L carotid   bruits .No ischemic skin changes.     Skin: Warm & dry w/o jaundice or tenting.          Assessment & Plan:  #1 paroxysmal nocturnal dyspnea in the context of international travel. Past medical history of similar symptoms which responded to clonazepam  #2 carotid bruits, right greater than left versus radiation of basilar murmur  Plan: See orders  & recommendations

## 2012-06-20 NOTE — Addendum Note (Signed)
Addended by: Maurice Small on: 06/20/2012 09:28 AM   Modules accepted: Orders

## 2012-06-20 NOTE — Patient Instructions (Signed)
Order for x-rays entered into  the computer; these will be performed at [address] across from [facility]. No appointment is necessary. 

## 2012-06-21 ENCOUNTER — Other Ambulatory Visit: Payer: Self-pay | Admitting: Internal Medicine

## 2012-06-21 MED ORDER — LEVOTHYROXINE SODIUM 25 MCG PO TABS: 25.0000 ug | ORAL_TABLET | Freq: Every day | ORAL | Status: DC

## 2012-06-21 NOTE — Telephone Encounter (Signed)
refill  Levothyroxine (Tab) 25 MCG Take 1 tablet (25 mcg total) by mouth daily. requesting 90-day supply last fill not listed

## 2012-06-22 NOTE — Addendum Note (Signed)
Addended by: Maurice Small on: 06/22/2012 11:02 AM   Modules accepted: Orders

## 2012-06-30 ENCOUNTER — Encounter (INDEPENDENT_AMBULATORY_CARE_PROVIDER_SITE_OTHER): Payer: BC Managed Care – PPO

## 2012-06-30 DIAGNOSIS — I6529 Occlusion and stenosis of unspecified carotid artery: Secondary | ICD-10-CM

## 2012-06-30 DIAGNOSIS — R0989 Other specified symptoms and signs involving the circulatory and respiratory systems: Secondary | ICD-10-CM

## 2012-08-28 ENCOUNTER — Other Ambulatory Visit: Payer: Self-pay | Admitting: Internal Medicine

## 2012-08-29 NOTE — Telephone Encounter (Signed)
Lipid/Hep 272.4/995.20  

## 2012-09-25 ENCOUNTER — Encounter: Payer: Self-pay | Admitting: Gastroenterology

## 2012-09-28 ENCOUNTER — Other Ambulatory Visit: Payer: Self-pay | Admitting: Dermatology

## 2012-10-23 ENCOUNTER — Encounter: Payer: Self-pay | Admitting: Gastroenterology

## 2012-12-25 ENCOUNTER — Ambulatory Visit (AMBULATORY_SURGERY_CENTER): Payer: Medicare Other | Admitting: *Deleted

## 2012-12-25 ENCOUNTER — Encounter: Payer: Self-pay | Admitting: Gastroenterology

## 2012-12-25 VITALS — Ht 68.0 in | Wt 189.0 lb

## 2012-12-25 DIAGNOSIS — Z86010 Personal history of colonic polyps: Secondary | ICD-10-CM

## 2012-12-25 MED ORDER — MOVIPREP 100 G PO SOLR: ORAL | Status: DC

## 2012-12-25 NOTE — Progress Notes (Signed)
Patient denies any allergies to eggs or soy. Patient denies any problems with anesthesia.  

## 2012-12-26 ENCOUNTER — Encounter: Payer: Self-pay | Admitting: Internal Medicine

## 2012-12-26 ENCOUNTER — Ambulatory Visit (INDEPENDENT_AMBULATORY_CARE_PROVIDER_SITE_OTHER): Payer: Medicare Other | Admitting: Internal Medicine

## 2012-12-26 ENCOUNTER — Other Ambulatory Visit: Payer: Self-pay | Admitting: Internal Medicine

## 2012-12-26 VITALS — BP 120/70 | HR 60 | Temp 97.7°F | Wt 189.0 lb

## 2012-12-26 DIAGNOSIS — M1712 Unilateral primary osteoarthritis, left knee: Secondary | ICD-10-CM

## 2012-12-26 DIAGNOSIS — M5414 Radiculopathy, thoracic region: Secondary | ICD-10-CM

## 2012-12-26 DIAGNOSIS — N4400 Torsion of testis, unspecified: Secondary | ICD-10-CM

## 2012-12-26 DIAGNOSIS — IMO0002 Reserved for concepts with insufficient information to code with codable children: Secondary | ICD-10-CM

## 2012-12-26 DIAGNOSIS — J069 Acute upper respiratory infection, unspecified: Secondary | ICD-10-CM

## 2012-12-26 DIAGNOSIS — E785 Hyperlipidemia, unspecified: Secondary | ICD-10-CM

## 2012-12-26 DIAGNOSIS — M1710 Unilateral primary osteoarthritis, unspecified knee: Secondary | ICD-10-CM

## 2012-12-26 MED ORDER — TRAMADOL HCL 50 MG PO TABS: 50.0000 mg | ORAL_TABLET | Freq: Four times a day (QID) | ORAL | Status: DC | PRN

## 2012-12-26 MED ORDER — FLUTICASONE PROPIONATE 50 MCG/ACT NA SUSP: 1.0000 | Freq: Two times a day (BID) | NASAL | Status: DC | PRN

## 2012-12-26 NOTE — Progress Notes (Signed)
Subjective:    Patient ID: Jeremy Waters, male    DOB: [birth year], 67 y.o.   MRN: [id number]  HPI   Approximately 3 hours into his flight from [place], [place]  12/23/12 he awoke with head congestion. He's continued to have malaise since. He's been using over-the-counter day/night Mucinex products and in a pot with partial response. He did have some blood in the nasal discharge.  The head congestion was associated with myalgias and arthralgias. Additionally 8/3 he began to have  discomfort in the left inferior rib cage with lateral rotation which to back . There's been no pleuritic component to it. This has improved with Aleve; he also takes this for DJD of knee. There's been no associated definite thoracic radicular pain. There's been no associated rash  He also has some morning sore throat which he attributes to postnasal drainage  For a few weeks he's had a burning discomfort in the left inguinal area; he has a history of inguinal hernia bilaterally    Review of Systems  He denies fever, chills, or sweats  He's had no cough, sputum production, or hemoptysis. There's been no associated wheezing or shortness of breath  No pedal edema  He has no bowel changes of constipation or diarrhea. He's had no melena or rectal bleeding. He denies dysphasia but has been taking Prilosec almost daily.  He has no dysuria, pyuria, or hematuria. With the Mucinex he's noted increased nocturia 2-3 times per night.     Objective:   Physical Exam Gen.: Healthy and well-nourished in appearance. Alert, appropriate and cooperative throughout exam.  Eyes: No corneal or conjunctival inflammation noted.  Ears:Hearing aids bilaterally. Nose: External nasal exam reveals no deformity or inflammation. Nasal mucosa are pink and moist. No lesions or exudates noted. Septum slightly to R  Mouth: Oral mucosa and oropharynx reveal no lesions or exudates. Minor erythema. Teeth in good repair. Neck: No deformities, masses,  or tenderness noted. Lungs: Normal respiratory effort; chest expands symmetrically.  Lungs are clear to auscultation without rales, wheezes, or increased work of breathing. No rub heard in the left inferior chest  No pain with compression of the thorax or percussion over the inferior rib cage on the left. He has some discomfort in this area sitting up. Heart: Normal rate and rhythm. Normal S1 and S2. No gallop, click, or rub. No murmur. Abdomen: Bowel sounds normal; abdomen soft and nontender. No masses, organomegaly or hernias noted.  Genitourinary: Large variceal on the left. No testicular abnormalities. No hernia directly or indirectly                                Musculoskeletal/extremities: No deformity or scoliosis noted of  the thoracic or lumbar spine.  No clubbing, cyanosis, edema, or significant extremity  deformity noted. Range of motion normal .Tone & strength  Normal. Joints normal . Able to lie down & sit up w/o help. Negative SLR bilaterally. Homans sign negative bilaterally Vascular: Carotid, radial artery, dorsalis pedis and  posterior tibial pulses are full and equal. No bruits present. Neurologic: Alert and oriented x3.  Skin: Intact without suspicious lesions or rashes. Lymph: No cervical, axillary, or inguinal lymphadenopathy present. Psych: Mood and affect are normal. Normally interactive  Assessment & Plan:  #1 upper respiratory tract congestion without purulence or signs of rhinosinusitis  #2 left inferior rib cage pain which is rotational. Despite his international travel; clinically there is no suggestion of PTE  #3 left inguinal pain; this is most likely related to testicular torsion in the context of a large varicocele on the left.  Plan see orders and recommendations

## 2012-12-26 NOTE — Patient Instructions (Addendum)
Plain Mucinex (NOT D) for thick secretions ;force NON dairy fluids .   Nasal cleansing in the shower as discussed with lather of mild shampoo.After 10 seconds wash off lather while  exhaling through nostrils. Make sure that all residual soap is removed to prevent irritation.  Fluticasone 1 spray in each nostril twice a day as needed. Use the "crossover" technique into opposite nostril spraying toward opposite ear @ 45 degree angle, not straight up into nostril.  Use a Neti pot daily only  as needed for significant sinus congestion; going from open side to congested side . Plain Allegra (NOT D )  160 daily , Loratidine 10 mg , OR Zyrtec 10 mg @ bedtime  as needed for itchy eyes & sneezing.   The best exercises for the thoracic spine include freestyle swimming, stretch aerobics,  Yoga & Cybex/Nautilus with low weights.

## 2012-12-27 ENCOUNTER — Other Ambulatory Visit: Payer: Self-pay

## 2012-12-27 NOTE — Telephone Encounter (Signed)
Future orders already placed

## 2013-01-01 ENCOUNTER — Other Ambulatory Visit: Payer: Self-pay | Admitting: Internal Medicine

## 2013-01-02 NOTE — Telephone Encounter (Signed)
Left message on VM informing patient of newly developed policy (controlled substance contract), patient to stop by the office to sign contract and retrieve rx

## 2013-01-04 ENCOUNTER — Other Ambulatory Visit: Payer: Self-pay | Admitting: Internal Medicine

## 2013-01-04 ENCOUNTER — Encounter: Payer: Self-pay | Admitting: Gastroenterology

## 2013-01-04 ENCOUNTER — Ambulatory Visit (AMBULATORY_SURGERY_CENTER): Payer: Medicare Other | Admitting: Gastroenterology

## 2013-01-04 ENCOUNTER — Other Ambulatory Visit (INDEPENDENT_AMBULATORY_CARE_PROVIDER_SITE_OTHER): Payer: Medicare Other

## 2013-01-04 VITALS — BP 153/76 | HR 52 | Temp 97.5°F | Resp 13 | Ht 68.0 in | Wt 189.0 lb

## 2013-01-04 DIAGNOSIS — E785 Hyperlipidemia, unspecified: Secondary | ICD-10-CM

## 2013-01-04 DIAGNOSIS — E039 Hypothyroidism, unspecified: Secondary | ICD-10-CM

## 2013-01-04 DIAGNOSIS — D126 Benign neoplasm of colon, unspecified: Secondary | ICD-10-CM

## 2013-01-04 DIAGNOSIS — Z86010 Personal history of colonic polyps: Secondary | ICD-10-CM

## 2013-01-04 DIAGNOSIS — Z800 Family history of malignant neoplasm of digestive organs: Secondary | ICD-10-CM

## 2013-01-04 LAB — LIPID PANEL
Cholesterol: 255 mg/dL — ABNORMAL HIGH (ref 0–200)
HDL: 58.2 mg/dL (ref >39.00)
Total CHOL/HDL Ratio: 4
Triglycerides: 143 mg/dL (ref 0.0–149.0)
VLDL: 28.6 mg/dL (ref 0.0–40.0)

## 2013-01-04 LAB — HEPATIC FUNCTION PANEL
ALT: 26 U/L (ref 0–53)
AST: 26 U/L (ref 0–37)
Albumin: 4.1 g/dL (ref 3.5–5.2)
Alkaline Phosphatase: 65 U/L (ref 39–117)
Bilirubin, Direct: 0.1 mg/dL (ref 0.0–0.3)
Total Bilirubin: 0.9 mg/dL (ref 0.3–1.2)
Total Protein: 7.8 g/dL (ref 6.0–8.3)

## 2013-01-04 LAB — TSH: TSH: 6.39 u[IU]/mL — ABNORMAL HIGH (ref 0.35–5.50)

## 2013-01-04 LAB — LDL CHOLESTEROL, DIRECT: Direct LDL: 179.6 mg/dL

## 2013-01-04 MED ORDER — SODIUM CHLORIDE 0.9 % IV SOLN: 500.0000 mL | INTRAVENOUS | Status: DC

## 2013-01-04 NOTE — Progress Notes (Signed)
Called to room to assist during endoscopic procedure.  Patient ID and intended procedure confirmed with present staff. Received instructions for my participation in the procedure from the performing physician.  

## 2013-01-04 NOTE — Progress Notes (Signed)
Patient did not experience any of the following events: a burn prior to discharge; a fall within the facility; wrong site/side/patient/procedure/implant event; or a hospital transfer or hospital admission upon discharge from the facility. (G8907) Patient did not have preoperative order for IV antibiotic SSI prophylaxis. (G8918)  

## 2013-01-04 NOTE — Assessment & Plan Note (Signed)
01/05/13 Synthroid increased from 25 mcg daily to 25 mcg 1&1/2 pills daily. Recheck TSH after 10 weeks

## 2013-01-04 NOTE — Progress Notes (Signed)
Lidocaine-40mg IV prior to Propofol InductionPropofol given over incremental dosages 

## 2013-01-04 NOTE — Op Note (Signed)
[facility] [address] [place], [address]   COLONOSCOPY PROCEDURE REPORT  PATIENT: Jeremy Waters, Jeremy Waters  MR#: [id number] BIRTHDATE: 1945-08-23 , 66  yrs. old GENDER: Male ENDOSCOPIST: Meryl Dare, MD, [facility] PROCEDURE DATE:  01/04/2013 PROCEDURE:   Colonoscopy with biopsy and snare polypectomy First Screening Colonoscopy - Avg.  risk and is 50 yrs.  old or older - No.  Prior Negative Screening - Now for repeat screening. N/A  History of Adenoma - Now for follow-up colonoscopy & has been > or = to 3 yrs.  Yes hx of adenoma.  Has been 3 or more years since last colonoscopy.  Polyps Removed Today? Yes. ASA CLASS:   Class II INDICATIONS:Patient's personal history of adenomatous colon polyps.  MEDICATIONS: MAC sedation, administered by CRNA and propofol (Diprivan) 300mg  IV DESCRIPTION OF PROCEDURE:   After the risks benefits and alternatives of the procedure were thoroughly explained, informed consent was obtained.  A digital rectal exam revealed no abnormalities of the rectum.   The [id number]  endoscope was introduced through the anus and advanced to the cecum, which was identified by both the appendix and ileocecal valve. No adverse events experienced.   The quality of the prep was good, using MoviPrep  The instrument was then slowly withdrawn as the colon was fully examined.  COLON FINDINGS: A sessile polyp measuring 5 mm in size was found at the ileocecal valve.  A polypectomy was performed with a cold snare.  The resection was complete and the polyp tissue was completely retrieved.   A sessile polyp measuring 6 mm in size was found in the proximal transverse colon.  A polypectomy was performed with a cold snare.  The resection was complete and the polyp tissue was completely retrieved.   A sessile polyp measuring 4 mm in size was found in the proximal transverse colon.  A polypectomy was performed with cold forceps.  The resection was complete and  the polyp tissue was completely retrieved.   A sessile polyp measuring 8 mm in size was found in the distal transverse colon.  A polypectomy was performed using snare cautery.  The resection was complete and the polyp tissue was completely retrieved.   A sessile polyp measuring 5 mm in size was found in the descending colon.  A polypectomy was performed with a cold snare.  The resection was complete and the polyp tissue was completely retrieved.   Moderate diverticulosis was noted in the sigmoid colon.   The colon was otherwise normal.  There was no diverticulosis, inflammation, polyps or cancers unless previously stated.  Retroflexed views revealed internal hemorrhoids. The time to cecum=3 minutes 15 seconds.  Withdrawal time=12 minutes 30 seconds.  The scope was withdrawn and the procedure completed. COMPLICATIONS: There were no complications.  ENDOSCOPIC IMPRESSION: 1.   Sessile polyp measuring 5 mm at the ileocecal valve; polypectomy performed with a cold snare 2.   Sessile polyp measuring 6 mm in the proximal transverse colon; polypectomy performed with a cold snare 3.   Sessile polyp measuring 4 mm in the proximal transverse colon; polypectomy performed with cold forceps 4.   Sessile polyp measuring 8 mm in the distal transverse colon; polypectomy performed using snare cautery 5.   Sessile polyp measuring 5 mm in the descending colon; polypectomy performed with a cold snare 6.   Moderate diverticulosis was noted in the sigmoid colon 7.   Moderate internal hemorrhoids     RECOMMENDATIONS: 1.  Await pathology  results 2.  Hold aspirin, aspirin products, and anti-inflammatory medication for 2 weeks. 3.  Repeat colonoscopy in 3 years if 3 or more polyps adenomatous; otherwise 5 years   eSigned:  Meryl Dare, MD, [facility] 01/04/2013 9:28 AM     PATIENT NAME:  Jeremy Waters, Jeremy Waters MR#: [id number]

## 2013-01-04 NOTE — Patient Instructions (Addendum)
YOU HAD AN ENDOSCOPIC PROCEDURE TODAY AT THE [facility]: Refer to the procedure report that was given to you for any specific questions about what was found during the examination.  If the procedure report does not answer your questions, please call your gastroenterologist to clarify.  If you requested that your care partner not be given the details of your procedure findings, then the procedure report has been included in a sealed envelope for you to review at your convenience later.  YOU SHOULD EXPECT: Some feelings of bloating in the abdomen. Passage of more gas than usual.  Walking can help get rid of the air that was put into your GI tract during the procedure and reduce the bloating. If you had a lower endoscopy (such as a colonoscopy or flexible sigmoidoscopy) you may notice spotting of blood in your stool or on the toilet paper. If you underwent a bowel prep for your procedure, then you may not have a normal bowel movement for a few days.  DIET: Your first meal following the procedure should be a light meal and then it is ok to progress to your normal diet.  A half-sandwich or bowl of soup is an example of a good first meal.  Heavy or fried foods are harder to digest and may make you feel nauseous or bloated.  Likewise meals heavy in dairy and vegetables can cause extra gas to form and this can also increase the bloating.  Drink plenty of fluids but you should avoid alcoholic beverages for 24 hours.  ACTIVITY: Your care partner should take you home directly after the procedure.  You should plan to take it easy, moving slowly for the rest of the day.  You can resume normal activity the day after the procedure however you should NOT DRIVE or use heavy machinery for 24 hours (because of the sedation medicines used during the test).    SYMPTOMS TO REPORT IMMEDIATELY: A gastroenterologist can be reached at any hour.  During normal business hours, 8:30 AM to 5:00 PM Monday through Friday,  call [phone].  After hours and on weekends, please call the GI answering service at [phone] who will take a message and have the physician on call contact you.   Following lower endoscopy (colonoscopy or flexible sigmoidoscopy):  Excessive amounts of blood in the stool  Significant tenderness or worsening of abdominal pains  Swelling of the abdomen that is new, acute  Fever of 100F or higher   FOLLOW UP: If any biopsies were taken you will be contacted by phone or by letter within the next 1-3 weeks.  Call your gastroenterologist if you have not heard about the biopsies in 3 weeks.  Our staff will call the home number listed on your records the next business day following your procedure to check on you and address any questions or concerns that you may have at that time regarding the information given to you following your procedure. This is a courtesy call and so if there is no answer at the home number and we have not heard from you through the emergency physician on call, we will assume that you have returned to your regular daily activities without incident.  SIGNATURES/CONFIDENTIALITY: You and/or your care partner have signed paperwork which will be entered into your electronic medical record.  These signatures attest to the fact that that the information above on your After Visit Summary has been reviewed and is understood.  Full responsibility of the confidentiality of   this discharge information lies with you and/or your care-partner.    Handouts were given to your care partner on polyps, diverticulosis, hemorrhoids and a high fiber diet. Please hold any aspirin, aspirin products or any anti-inflammatory medications for 2 weeks.  You may resume your other current medications today. Please call if any questions or concerns.

## 2013-01-04 NOTE — Progress Notes (Signed)
No complaints noted in the recovery room. Maw   

## 2013-01-05 ENCOUNTER — Telehealth: Payer: Self-pay

## 2013-01-05 NOTE — Telephone Encounter (Signed)
Left message to call [facility] if concerns following procedure

## 2013-01-09 ENCOUNTER — Encounter: Payer: Self-pay | Admitting: Gastroenterology

## 2013-01-16 ENCOUNTER — Encounter: Payer: Self-pay | Admitting: Internal Medicine

## 2013-03-15 ENCOUNTER — Ambulatory Visit: Payer: Medicare Other

## 2013-03-29 ENCOUNTER — Other Ambulatory Visit: Payer: Self-pay

## 2013-04-01 ENCOUNTER — Other Ambulatory Visit: Payer: Self-pay | Admitting: Internal Medicine

## 2013-04-02 ENCOUNTER — Other Ambulatory Visit: Payer: Self-pay | Admitting: *Deleted

## 2013-04-02 MED ORDER — LEVOTHYROXINE SODIUM 25 MCG PO TABS: 25.0000 ug | ORAL_TABLET | Freq: Every day | ORAL | Status: DC

## 2013-04-02 NOTE — Telephone Encounter (Signed)
Levothyroxine refill sent to pharmacy 

## 2013-04-02 NOTE — Telephone Encounter (Signed)
Pravastatin refill sent to pharmacy

## 2013-06-07 ENCOUNTER — Encounter: Payer: Self-pay | Admitting: Internal Medicine

## 2013-06-07 ENCOUNTER — Ambulatory Visit (INDEPENDENT_AMBULATORY_CARE_PROVIDER_SITE_OTHER): Payer: Medicare Other | Admitting: Internal Medicine

## 2013-06-07 VITALS — BP 130/79 | HR 58 | Temp 98.3°F | Wt 194.6 lb

## 2013-06-07 DIAGNOSIS — E039 Hypothyroidism, unspecified: Secondary | ICD-10-CM

## 2013-06-07 DIAGNOSIS — M1710 Unilateral primary osteoarthritis, unspecified knee: Secondary | ICD-10-CM

## 2013-06-07 DIAGNOSIS — E785 Hyperlipidemia, unspecified: Secondary | ICD-10-CM

## 2013-06-07 DIAGNOSIS — M1712 Unilateral primary osteoarthritis, left knee: Secondary | ICD-10-CM | POA: Insufficient documentation

## 2013-06-07 DIAGNOSIS — R0989 Other specified symptoms and signs involving the circulatory and respiratory systems: Secondary | ICD-10-CM

## 2013-06-07 DIAGNOSIS — Z23 Encounter for immunization: Secondary | ICD-10-CM

## 2013-06-07 DIAGNOSIS — IMO0002 Reserved for concepts with insufficient information to code with codable children: Secondary | ICD-10-CM

## 2013-06-07 LAB — TSH: TSH: 1.38 u[IU]/mL (ref 0.35–5.50)

## 2013-06-07 MED ORDER — CLONAZEPAM 0.5 MG PO TABS: ORAL_TABLET | ORAL | Status: DC

## 2013-06-07 NOTE — Patient Instructions (Signed)
Your next office appointment will be determined based upon review of your pending labs . Those instructions will be transmitted to you through My Chart  Please notify Dr.Olin's PA  that preoperative clearance has been completed and is in Epic for review

## 2013-06-07 NOTE — Assessment & Plan Note (Signed)
TSH 

## 2013-06-07 NOTE — Progress Notes (Signed)
Pre visit review using our clinic review tool, if applicable. No additional management support is needed unless otherwise documented below in the visit note. 

## 2013-06-07 NOTE — Assessment & Plan Note (Signed)
Annual monitor 

## 2013-06-07 NOTE — Assessment & Plan Note (Signed)
Repeat lipids when TSH WNL

## 2013-06-07 NOTE — Progress Notes (Signed)
Subjective:    Patient ID: Jeremy Waters, male    DOB: 1945/10/28, 68 y.o.   MRN: [id number]  HPI Total left knee is tentatively scheduled for 07/03/13. This is for end-stage degenerative disease with "bone-on-bone". He has a constant dull ache in this area. This is exacerbated by activities, particularly navigating stairs. Approximately 2 years ago he did have a steroid injection which was of no benefit. He's also been taking nonsteroidals.  He does have dyslipidemia; his last LDL was 179.6 in August of 2014. At that time his TSH was 6.39 indicating suboptimal correction of hypothyroidism. At that time the thyroid replacement was to have been increased to 25 mcg 1-1/2 pills daily; that was not done. A modified heart healthy diet is followed; exercise encompasses 30 minutes 4-5  times per week as  elliptical without symptoms.  Family history is negative for premature coronary disease. Advanced cholesterol testing reveals  LDL goal is less than 120 ; ideally < 90. There is medication compliance with the statin.  Low dose ASA not taken      Review of Systems  Specifically denied are  chest pain, palpitations, dyspnea, or claudication.  Significant abdominal symptoms, memory deficit, or myalgias not present.  Dyspepsia symptoms resolved off nonsteroidals.  He denies any change of hair, nails, or skin  He has no intolerance to cold.      Objective:   Physical Exam Gen.: Healthy and well-nourished in appearance. Alert, appropriate and cooperative throughout exam.   Head: Normocephalic without obvious abnormalities  Eyes: No corneal or conjunctival inflammation noted. Pupils equal round reactive to light and accommodation. Extraocular motion intact. Arcus senilis Nose: External nasal exam reveals no deformity or inflammation. Nasal mucosa are pink and moist. No lesions or exudates noted. Septum  Deviated to R  Mouth: Oral mucosa and oropharynx reveal no lesions or exudates. Teeth in  good repair. Neck: No deformities, masses, or tenderness noted. Range of motion decreased to L. Thyroid normal. Lungs: Normal respiratory effort; chest expands symmetrically. Lungs are clear to auscultation without rales, wheezes, or increased work of breathing. Heart: Normal rate and rhythm. Normal S1 and S2. No gallop, click, or rub. Grade 1/6 systolic murmur. Abdomen: Bowel sounds normal; abdomen soft and nontender. No masses, organomegaly or hernias noted.                                   Musculoskeletal/extremities: No deformity or scoliosis noted of  the thoracic or lumbar spine.   No clubbing, cyanosis, edema, or significant extremity  deformity noted. Range of motion normal .Tone & strength normal. Hand joints reveal minor  DJD DIP changes. Fingernail  health good. Severe crepitus L knee; effusion present Able to lie down & sit up w/o help. Negative SLR bilaterally Vascular: Carotid, radial artery, dorsalis pedis and  posterior tibial pulses are full and equal. No bruits present. Neurologic: Alert and oriented x3. Deep tendon reflexes symmetrical and normal.        Skin: Intact without suspicious lesions or rashes. Lymph: No cervical, axillary lymphadenopathy present. Psych: Mood and affect are normal. Normally interactive  Assessment & Plan:  See Current Assessment & Plan in Problem List under specific Diagnosis

## 2013-06-07 NOTE — Assessment & Plan Note (Signed)
Cleared for surgery 

## 2013-06-14 ENCOUNTER — Encounter (HOSPITAL_COMMUNITY): Payer: Self-pay | Admitting: Pharmacy Technician

## 2013-06-15 ENCOUNTER — Encounter (HOSPITAL_COMMUNITY)
Admission: RE | Admit: 2013-06-15 | Discharge: 2013-06-15 | Disposition: A | Discharge status: Home / Self Care | Payer: Medicare Other | Source: Ambulatory Visit | Attending: Orthopedic Surgery | Admitting: Orthopedic Surgery

## 2013-06-15 ENCOUNTER — Encounter (HOSPITAL_COMMUNITY): Payer: Self-pay

## 2013-06-15 ENCOUNTER — Ambulatory Visit (HOSPITAL_COMMUNITY)
Admission: RE | Admit: 2013-06-15 | Discharge: 2013-06-15 | Disposition: A | Discharge status: Home / Self Care | Payer: Medicare Other | Source: Ambulatory Visit | Attending: Orthopedic Surgery | Admitting: Orthopedic Surgery

## 2013-06-15 DIAGNOSIS — Z01810 Encounter for preprocedural cardiovascular examination: Secondary | ICD-10-CM | POA: Insufficient documentation

## 2013-06-15 DIAGNOSIS — I771 Stricture of artery: Secondary | ICD-10-CM | POA: Insufficient documentation

## 2013-06-15 DIAGNOSIS — Z01812 Encounter for preprocedural laboratory examination: Secondary | ICD-10-CM | POA: Insufficient documentation

## 2013-06-15 DIAGNOSIS — Z01818 Encounter for other preprocedural examination: Secondary | ICD-10-CM | POA: Insufficient documentation

## 2013-06-15 HISTORY — DX: Cardiac murmur, unspecified: R01.1

## 2013-06-15 HISTORY — DX: Unspecified osteoarthritis, unspecified site: M19.90

## 2013-06-15 LAB — BASIC METABOLIC PANEL
BUN: 16 mg/dL (ref 6–23)
CO2: 24 mEq/L (ref 19–32)
Calcium: 9.4 mg/dL (ref 8.4–10.5)
Chloride: 104 mEq/L (ref 96–112)
Creatinine, Ser: 0.85 mg/dL (ref 0.50–1.35)
GFR calc Af Amer: >90 mL/min (ref >90)
GFR calc non Af Amer: 89 mL/min — ABNORMAL LOW (ref >90)
Glucose, Bld: 94 mg/dL (ref 70–99)
Potassium: 4.3 mEq/L (ref 3.7–5.3)
Sodium: 142 mEq/L (ref 137–147)

## 2013-06-15 LAB — URINALYSIS, ROUTINE W REFLEX MICROSCOPIC
Bilirubin Urine: NEGATIVE
Glucose, UA: NEGATIVE mg/dL
Hgb urine dipstick: NEGATIVE
Ketones, ur: NEGATIVE mg/dL
Leukocytes, UA: NEGATIVE
Nitrite: NEGATIVE
Protein, ur: NEGATIVE mg/dL
Specific Gravity, Urine: 1.019 (ref 1.005–1.030)
Urobilinogen, UA: 0.2 mg/dL (ref 0.0–1.0)
pH: 7 (ref 5.0–8.0)

## 2013-06-15 LAB — CBC
HCT: 43.3 % (ref 39.0–52.0)
Hemoglobin: 14.5 g/dL (ref 13.0–17.0)
MCH: 32 pg (ref 26.0–34.0)
MCHC: 33.5 g/dL (ref 30.0–36.0)
MCV: 95.6 fL (ref 78.0–100.0)
Platelets: 251 10*3/uL (ref 150–400)
RBC: 4.53 MIL/uL (ref 4.22–5.81)
RDW: 13.4 % (ref 11.5–15.5)
WBC: 5.8 10*3/uL (ref 4.0–10.5)

## 2013-06-15 LAB — APTT: aPTT: 25 seconds (ref 24–37)

## 2013-06-15 LAB — PROTIME-INR
INR: 0.97 (ref 0.00–1.49)
Prothrombin Time: 12.7 seconds (ref 11.6–15.2)

## 2013-06-15 LAB — SURGICAL PCR SCREEN
MRSA, PCR: NEGATIVE
Staphylococcus aureus: POSITIVE — AB

## 2013-06-15 NOTE — Pre-Procedure Instructions (Signed)
EKG AND CXR WERE DONE TODAY - PREOP AT [facility]. 

## 2013-06-15 NOTE — Patient Instructions (Addendum)
YOUR SURGERY IS SCHEDULED AT [facility]  ON: Tuesday  2/10  REPORT TO  SHORT STAY CENTER AT:  9:10 AM      PHONE # FOR SHORT STAY IS [phone]  DO NOT EAT OR DRINK ANYTHING AFTER MIDNIGHT THE NIGHT BEFORE YOUR SURGERY.  YOU MAY BRUSH YOUR TEETH, RINSE OUT YOUR MOUTH--BUT NO WATER, NO FOOD, NO CHEWING GUM, NO MINTS, NO CANDIES, NO CHEWING TOBACCO.  PLEASE TAKE THE FOLLOWING MEDICATIONS THE AM OF YOUR SURGERY WITH A FEW SIPS OF WATER:    LEVOTHYROXINE,  USE YOUR FLONASE    DO NOT BRING VALUABLES, MONEY, CREDIT CARDS.  DO NOT WEAR JEWELRY, MAKE-UP, NAIL POLISH AND NO METAL PINS OR CLIPS IN YOUR HAIR. CONTACT LENS, DENTURES / PARTIALS, GLASSES SHOULD NOT BE WORN TO SURGERY AND IN MOST CASES-HEARING AIDS WILL NEED TO BE REMOVED.  BRING YOUR GLASSES CASE, ANY EQUIPMENT NEEDED FOR YOUR CONTACT LENS. FOR PATIENTS ADMITTED TO THE HOSPITAL--CHECK OUT TIME THE DAY OF DISCHARGE IS 11:00 AM.  ALL INPATIENT ROOMS ARE PRIVATE - WITH BATHROOM, TELEPHONE, TELEVISION AND WIFI INTERNET.                                                    PLEASE READ OVER ANY  FACT SHEETS THAT YOU WERE GIVEN: MRSA INFORMATION, BLOOD TRANSFUSION INFORMATION, INCENTIVE SPIROMETER INFORMATION.  FAILURE TO FOLLOW THESE INSTRUCTIONS MAY RESULT IN THE CANCELLATION OF YOUR SURGERY. PLEASE BE AWARE THAT YOU MAY NEED ADDITIONAL BLOOD DRAWN DAY OF YOUR SURGERY  PATIENT SIGNATURE_________________________________

## 2013-06-20 NOTE — H&P (Signed)
TOTAL KNEE ADMISSION H&P  Patient is being admitted for left total knee arthroplasty.  Subjective:  Chief Complaint:    Left knee OA / pain.  HPI: Jeremy Waters, 68 y.o. male, has a history of pain and functional disability in the left knee due to trauma and arthritis and has failed non-surgical conservative treatments for greater than 12 weeks to include NSAID's and/or analgesics, use of assistive devices and activity modification.  Onset of symptoms was gradual, starting >10 years ago with gradually worsening course since that time. The patient noted prior procedures on the knee to include  ORIF on the left knee(s).  Patient currently rates pain in the left knee(s) at 6 out of 10 with activity. Patient has worsening of pain with activity and weight bearing, pain that interferes with activities of daily living, pain with passive range of motion, crepitus and joint swelling.  Patient has evidence of periarticular osteophytes and joint space narrowing by imaging studies.  There is no active infection.  Risks, benefits and expectations were discussed with the patient.  Risks including but not limited to the risk of anesthesia, blood clots, nerve damage, blood vessel damage, failure of the prosthesis, infection and up to and including death.  Patient understand the risks, benefits and expectations and wishes to proceed with surgery.   D/C Plans:   Home with HHPT  Post-op Meds:    No Rx given  Tranexamic Acid:   To be given  Decadron:    To be given  FYI:    ASA post-op  Norco post-op   Patient Active Problem List   Diagnosis Date Noted  . Left knee DJD 06/07/2013  . Right carotid bruit 06/07/2013  . Unspecified hypothyroidism 03/03/2012  . Axillary mass, right 04/07/2011  . ROSACEA 03/19/2010  . ARTHRALGIA 03/19/2010  . MYALGIA 03/19/2010  . CERVICALGIA 09/05/2009  . CARPAL TUNNEL SYNDROME 06/17/2008  . DEGENERATIVE JOINT DISEASE, GENERALIZED 06/17/2008  . CERVICAL RADICULOPATHY, LEFT  06/17/2008  . PARESTHESIA, HANDS 06/17/2008  . SLEEP DISORDER 03/04/2008  . PROSTATE CANCER, HX OF 03/04/2008  . HYPERLIPIDEMIA 06/15/2007  . DIVERTICULOSIS, COLON W/O HEM 03/03/2007   Past Medical History  Diagnosis Date  . Diverticulosis of colon     w/o hemorrage  . Hyperlipidemia   . Prostate cancer   . Colon polyp   . Inguinal hernia     left side   . Heart murmur     TOLD HE HAS A SLIGHT MURMUR  . Arthritis     OA / PAIN LEFT KNEE    Past Surgical History  Procedure Laterality Date  . Laminectomy  2000    L4-5  . Hernia repair  1987  . Knee surgery  1969    Patellar fracture fragment Lt  . Tonsillectomy    . Hernia repair  10/2003  . Back surgery  05/12/2005    L5  . Prostatectomy  04/16/2005  . Mass excision  04/26/2011    Procedure: MINOR EXCISION OF MASS;  Surgeon: Earnstine Regal, MD;  Location: [facility];  Service: General;  Laterality: Right;  Excise subcutaneous mass right axilla Minor Room  . Colonoscopy  1999    Negative; Dr Fuller Plan  . Colonoscopy  2004    tics, hemorrhoids  . Colonoscopy  2009    polyps (T.A.)    No prescriptions prior to admission   Allergies  Allergen Reactions  . Atorvastatin     Increased LFTs    History  Substance Use Topics  . Smoking status: Former Research scientist (life sciences)  . Smokeless tobacco: Never Used     Comment: stopped smoking cigarettes 1983, 1 cigar /day 2001-2013  . Alcohol Use: 3.5 oz/week    7 drink(s) per week     Comment: scotch or wine daily    Family History  Problem Relation Age of Onset  . Hypertension Paternal Uncle   . Alcohol abuse Paternal Uncle   . Stroke Paternal Uncle 55  . Lung cancer Mother   . COPD Mother   . Cancer Father     Bladder Cancer  . Prostate cancer Father   . Colon cancer Father 52  . Urolithiasis Father   . Lung cancer Sister   . Asthma Neg Hx   . Heart disease Neg Hx      Review of Systems  Constitutional: Negative.   HENT: Negative.   Eyes: Negative.    Respiratory: Negative.   Cardiovascular: Negative.   Gastrointestinal: Negative.   Genitourinary: Negative.   Musculoskeletal: Positive for back pain and joint pain.  Skin: Positive for rash.  Neurological: Negative.   Endo/Heme/Allergies: Negative.   Psychiatric/Behavioral: Negative.     Objective:  Physical Exam  Constitutional: He is oriented to person, place, and time. He appears well-developed and well-nourished.  HENT:  Head: Normocephalic and atraumatic.  Mouth/Throat: Oropharynx is clear and moist.  Eyes: Pupils are equal, round, and reactive to light.  Neck: Neck supple. No JVD present. No tracheal deviation present. No thyromegaly present.  Cardiovascular: Normal rate, regular rhythm and intact distal pulses.   Murmur heard. Respiratory: Effort normal and breath sounds normal. No respiratory distress. He has no wheezes.  GI: Soft. There is no tenderness. There is no guarding.  Musculoskeletal:       Left knee: He exhibits decreased range of motion, swelling and bony tenderness. He exhibits no effusion, no ecchymosis, no deformity, no laceration, no erythema and normal alignment. Tenderness found.  Lymphadenopathy:    He has no cervical adenopathy.  Neurological: He is alert and oriented to person, place, and time.  Skin: Skin is warm and dry.  Psychiatric: He has a normal mood and affect.    Labs:  Estimated body mass index is 28.74 kg/(m^2) as calculated from the following:   Height as of 01/04/13: 5\' 8"  (1.727 m).   Weight as of 01/04/13: 85.73 kg (189 lb).   Imaging Review Plain radiographs demonstrate severe degenerative joint disease of the left knee(s). The overall alignment is neutral. The bone quality appears to be good for age and reported activity level.  Assessment/Plan:  End stage arthritis, left knee   The patient history, physical examination, clinical judgment of the provider and imaging studies are consistent with end stage degenerative joint  disease of the left knee(s) and total knee arthroplasty is deemed medically necessary. The treatment options including medical management, injection therapy arthroscopy and arthroplasty were discussed at length. The risks and benefits of total knee arthroplasty were presented and reviewed. The risks due to aseptic loosening, infection, stiffness, patella tracking problems, thromboembolic complications and other imponderables were discussed. The patient acknowledged the explanation, agreed to proceed with the plan and consent was signed. Patient is being admitted for inpatient treatment for surgery, pain control, PT, OT, prophylactic antibiotics, VTE prophylaxis, progressive ambulation and ADL's and discharge planning. The patient is planning to be discharged home with home health services.      West Pugh Andera Cranmer   PAC  06/20/2013, 7:49 PM

## 2013-07-03 ENCOUNTER — Encounter (HOSPITAL_COMMUNITY): Payer: Self-pay | Admitting: *Deleted

## 2013-07-03 ENCOUNTER — Inpatient Hospital Stay (HOSPITAL_COMMUNITY)
Admission: RE | Admit: 2013-07-03 | Discharge: 2013-07-04 | DRG: 470 | Disposition: A | Discharge status: Home Health | Payer: Medicare Other | Source: Ambulatory Visit | Attending: Orthopedic Surgery | Admitting: Orthopedic Surgery

## 2013-07-03 ENCOUNTER — Encounter (HOSPITAL_COMMUNITY): Payer: Medicare Other | Admitting: Anesthesiology

## 2013-07-03 ENCOUNTER — Encounter (HOSPITAL_COMMUNITY)
Admission: RE | Disposition: A | Discharge status: Home Health | Payer: Self-pay | Source: Ambulatory Visit | Attending: Orthopedic Surgery

## 2013-07-03 ENCOUNTER — Inpatient Hospital Stay (HOSPITAL_COMMUNITY): Payer: Medicare Other | Admitting: Anesthesiology

## 2013-07-03 DIAGNOSIS — Z8042 Family history of malignant neoplasm of prostate: Secondary | ICD-10-CM

## 2013-07-03 DIAGNOSIS — E039 Hypothyroidism, unspecified: Secondary | ICD-10-CM | POA: Diagnosis present

## 2013-07-03 DIAGNOSIS — Z801 Family history of malignant neoplasm of trachea, bronchus and lung: Secondary | ICD-10-CM

## 2013-07-03 DIAGNOSIS — Z860100 Personal history of colon polyps, unspecified: Secondary | ICD-10-CM

## 2013-07-03 DIAGNOSIS — Z800 Family history of malignant neoplasm of digestive organs: Secondary | ICD-10-CM

## 2013-07-03 DIAGNOSIS — M1710 Unilateral primary osteoarthritis, unspecified knee: Principal | ICD-10-CM | POA: Diagnosis present

## 2013-07-03 DIAGNOSIS — Z823 Family history of stroke: Secondary | ICD-10-CM

## 2013-07-03 DIAGNOSIS — Z8546 Personal history of malignant neoplasm of prostate: Secondary | ICD-10-CM

## 2013-07-03 DIAGNOSIS — Z96659 Presence of unspecified artificial knee joint: Secondary | ICD-10-CM

## 2013-07-03 DIAGNOSIS — E785 Hyperlipidemia, unspecified: Secondary | ICD-10-CM | POA: Diagnosis present

## 2013-07-03 DIAGNOSIS — R011 Cardiac murmur, unspecified: Secondary | ICD-10-CM | POA: Diagnosis present

## 2013-07-03 DIAGNOSIS — Z87891 Personal history of nicotine dependence: Secondary | ICD-10-CM

## 2013-07-03 HISTORY — PX: TOTAL KNEE ARTHROPLASTY: SHX125

## 2013-07-03 LAB — ABO/RH: ABO/RH(D): A POS

## 2013-07-03 LAB — TYPE AND SCREEN
ABO/RH(D): A POS
Antibody Screen: NEGATIVE

## 2013-07-03 SURGERY — ARTHROPLASTY, KNEE, TOTAL
Anesthesia: Spinal | Site: Knee | Laterality: Left

## 2013-07-03 MED ORDER — BUPIVACAINE LIPOSOME 1.3 % IJ SUSP
20.0000 mL | Freq: Once | INTRAMUSCULAR | Status: DC
  Filled 2013-07-03: qty 20

## 2013-07-03 MED ORDER — FLEET ENEMA 7-19 GM/118ML RE ENEM: 1.0000 | ENEMA | Freq: Once | RECTAL | Status: AC | PRN

## 2013-07-03 MED ORDER — BETA CAROTENE 30 MG PO CAPS: 30.0000 mg | ORAL_CAPSULE | Freq: Every day | ORAL | Status: DC

## 2013-07-03 MED ORDER — HYDROCODONE-ACETAMINOPHEN 7.5-325 MG PO TABS
1.0000 | ORAL_TABLET | ORAL | Status: DC
  Administered 2013-07-03 – 2013-07-04 (×6): 2 via ORAL
  Filled 2013-07-03 (×6): qty 2

## 2013-07-03 MED ORDER — TRANEXAMIC ACID 100 MG/ML IV SOLN
1000.0000 mg | Freq: Once | INTRAVENOUS | Status: AC
  Administered 2013-07-03: 1000 mg via INTRAVENOUS
  Filled 2013-07-03: qty 10

## 2013-07-03 MED ORDER — PROPOFOL 10 MG/ML IV BOLUS
INTRAVENOUS | Status: AC
  Filled 2013-07-03: qty 20

## 2013-07-03 MED ORDER — DIPHENHYDRAMINE HCL 25 MG PO CAPS: 25.0000 mg | ORAL_CAPSULE | Freq: Four times a day (QID) | ORAL | Status: DC | PRN

## 2013-07-03 MED ORDER — PROPOFOL INFUSION 10 MG/ML OPTIME
INTRAVENOUS | Status: DC | PRN
  Administered 2013-07-03: 100 ug/kg/min via INTRAVENOUS
  Administered 2013-07-03: 120 ug/kg/min via INTRAVENOUS

## 2013-07-03 MED ORDER — DOCUSATE SODIUM 100 MG PO CAPS
100.0000 mg | ORAL_CAPSULE | Freq: Two times a day (BID) | ORAL | Status: DC
  Administered 2013-07-03 – 2013-07-04 (×2): 100 mg via ORAL

## 2013-07-03 MED ORDER — FERROUS SULFATE 325 (65 FE) MG PO TABS
325.0000 mg | ORAL_TABLET | Freq: Three times a day (TID) | ORAL | Status: DC
  Administered 2013-07-03 – 2013-07-04 (×3): 325 mg via ORAL
  Filled 2013-07-03 (×5): qty 1

## 2013-07-03 MED ORDER — ALUM & MAG HYDROXIDE-SIMETH 200-200-20 MG/5ML PO SUSP
30.0000 mL | ORAL | Status: DC | PRN
Start: 2013-07-03 — End: 2013-07-04

## 2013-07-03 MED ORDER — CELECOXIB 200 MG PO CAPS
200.0000 mg | ORAL_CAPSULE | Freq: Two times a day (BID) | ORAL | Status: DC
  Administered 2013-07-03 – 2013-07-04 (×2): 200 mg via ORAL
  Filled 2013-07-03 (×3): qty 1

## 2013-07-03 MED ORDER — ONDANSETRON HCL 4 MG/2ML IJ SOLN
INTRAMUSCULAR | Status: AC
  Filled 2013-07-03: qty 2

## 2013-07-03 MED ORDER — KETOROLAC TROMETHAMINE 30 MG/ML IJ SOLN
INTRAMUSCULAR | Status: AC
  Filled 2013-07-03: qty 1

## 2013-07-03 MED ORDER — POLYETHYLENE GLYCOL 3350 17 G PO PACK
17.0000 g | PACK | Freq: Two times a day (BID) | ORAL | Status: DC
  Administered 2013-07-04: 17 g via ORAL

## 2013-07-03 MED ORDER — METOCLOPRAMIDE HCL 10 MG PO TABS: 5.0000 mg | ORAL_TABLET | Freq: Three times a day (TID) | ORAL | Status: DC | PRN

## 2013-07-03 MED ORDER — ONDANSETRON HCL 4 MG/2ML IJ SOLN
INTRAMUSCULAR | Status: DC | PRN
  Administered 2013-07-03: 4 mg via INTRAVENOUS

## 2013-07-03 MED ORDER — BISACODYL 10 MG RE SUPP: 10.0000 mg | Freq: Every day | RECTAL | Status: DC | PRN

## 2013-07-03 MED ORDER — BUPIVACAINE LIPOSOME 1.3 % IJ SUSP
INTRAMUSCULAR | Status: DC | PRN
  Administered 2013-07-03: 20 mL

## 2013-07-03 MED ORDER — MIDAZOLAM HCL 2 MG/2ML IJ SOLN
INTRAMUSCULAR | Status: AC
  Filled 2013-07-03: qty 2

## 2013-07-03 MED ORDER — BUPIVACAINE-EPINEPHRINE (PF) 0.25% -1:200000 IJ SOLN
INTRAMUSCULAR | Status: DC | PRN
  Administered 2013-07-03: 25 mL

## 2013-07-03 MED ORDER — CHLORHEXIDINE GLUCONATE 4 % EX LIQD: 60.0000 mL | Freq: Once | CUTANEOUS | Status: DC

## 2013-07-03 MED ORDER — ZOLPIDEM TARTRATE 5 MG PO TABS: 5.0000 mg | ORAL_TABLET | Freq: Every evening | ORAL | Status: DC | PRN

## 2013-07-03 MED ORDER — SODIUM CHLORIDE 0.9 % IJ SOLN
INTRAMUSCULAR | Status: AC
  Filled 2013-07-03: qty 50

## 2013-07-03 MED ORDER — KETOROLAC TROMETHAMINE 30 MG/ML IJ SOLN
INTRAMUSCULAR | Status: DC | PRN
  Administered 2013-07-03: 30 mg via INTRAVENOUS

## 2013-07-03 MED ORDER — DEXAMETHASONE SODIUM PHOSPHATE 10 MG/ML IJ SOLN
10.0000 mg | Freq: Once | INTRAMUSCULAR | Status: AC
  Administered 2013-07-04: 10 mg via INTRAVENOUS
  Filled 2013-07-03: qty 1

## 2013-07-03 MED ORDER — CEFAZOLIN SODIUM-DEXTROSE 2-3 GM-% IV SOLR
INTRAVENOUS | Status: AC
  Filled 2013-07-03: qty 50

## 2013-07-03 MED ORDER — BUPIVACAINE-EPINEPHRINE 0.25% -1:200000 IJ SOLN
INTRAMUSCULAR | Status: AC
  Filled 2013-07-03: qty 1

## 2013-07-03 MED ORDER — CEFAZOLIN SODIUM-DEXTROSE 2-3 GM-% IV SOLR
2.0000 g | Freq: Four times a day (QID) | INTRAVENOUS | Status: AC
  Administered 2013-07-03 – 2013-07-04 (×2): 2 g via INTRAVENOUS
  Filled 2013-07-03 (×4): qty 50

## 2013-07-03 MED ORDER — PHENOL 1.4 % MT LIQD: 1.0000 | OROMUCOSAL | Status: DC | PRN

## 2013-07-03 MED ORDER — MENTHOL 3 MG MT LOZG
1.0000 | LOZENGE | OROMUCOSAL | Status: DC | PRN
  Filled 2013-07-03: qty 9

## 2013-07-03 MED ORDER — METOCLOPRAMIDE HCL 5 MG/ML IJ SOLN: 5.0000 mg | Freq: Three times a day (TID) | INTRAMUSCULAR | Status: DC | PRN

## 2013-07-03 MED ORDER — FENTANYL CITRATE 0.05 MG/ML IJ SOLN
INTRAMUSCULAR | Status: DC | PRN
  Administered 2013-07-03: 100 ug via INTRAVENOUS

## 2013-07-03 MED ORDER — DEXAMETHASONE SODIUM PHOSPHATE 10 MG/ML IJ SOLN
10.0000 mg | Freq: Once | INTRAMUSCULAR | Status: AC
  Administered 2013-07-03: 10 mg via INTRAVENOUS

## 2013-07-03 MED ORDER — HYDROMORPHONE HCL PF 1 MG/ML IJ SOLN: 0.5000 mg | INTRAMUSCULAR | Status: DC | PRN

## 2013-07-03 MED ORDER — SODIUM CHLORIDE 0.9 % IJ SOLN
INTRAMUSCULAR | Status: DC | PRN
  Administered 2013-07-03: 14 mL via INTRAVENOUS

## 2013-07-03 MED ORDER — LACTATED RINGERS IV SOLN
INTRAVENOUS | Status: DC
  Administered 2013-07-03: 13:00:00 via INTRAVENOUS
  Administered 2013-07-03: 1000 mL via INTRAVENOUS
  Administered 2013-07-03: 14:00:00 via INTRAVENOUS

## 2013-07-03 MED ORDER — LEVOTHYROXINE SODIUM 25 MCG PO TABS
25.0000 ug | ORAL_TABLET | Freq: Every day | ORAL | Status: DC
  Administered 2013-07-04: 25 ug via ORAL
  Filled 2013-07-03 (×2): qty 1

## 2013-07-03 MED ORDER — METHOCARBAMOL 500 MG PO TABS: 500.0000 mg | ORAL_TABLET | Freq: Four times a day (QID) | ORAL | Status: DC | PRN

## 2013-07-03 MED ORDER — FENTANYL CITRATE 0.05 MG/ML IJ SOLN
INTRAMUSCULAR | Status: AC
  Filled 2013-07-03: qty 2

## 2013-07-03 MED ORDER — CLONAZEPAM 0.5 MG PO TABS
0.5000 mg | ORAL_TABLET | Freq: Every day | ORAL | Status: DC
  Administered 2013-07-03: 0.5 mg via ORAL
  Filled 2013-07-03: qty 1

## 2013-07-03 MED ORDER — PROMETHAZINE HCL 25 MG/ML IJ SOLN
6.2500 mg | INTRAMUSCULAR | Status: DC | PRN
Start: 2013-07-03 — End: 2013-07-03

## 2013-07-03 MED ORDER — METHOCARBAMOL 100 MG/ML IJ SOLN
500.0000 mg | Freq: Four times a day (QID) | INTRAVENOUS | Status: DC | PRN
  Filled 2013-07-03: qty 5

## 2013-07-03 MED ORDER — HYDROMORPHONE HCL PF 1 MG/ML IJ SOLN: 0.2500 mg | INTRAMUSCULAR | Status: DC | PRN

## 2013-07-03 MED ORDER — CEFAZOLIN SODIUM-DEXTROSE 2-3 GM-% IV SOLR
2.0000 g | INTRAVENOUS | Status: AC
  Administered 2013-07-03: 2 g via INTRAVENOUS

## 2013-07-03 MED ORDER — ONDANSETRON HCL 4 MG PO TABS: 4.0000 mg | ORAL_TABLET | Freq: Four times a day (QID) | ORAL | Status: DC | PRN

## 2013-07-03 MED ORDER — MIDAZOLAM HCL 5 MG/5ML IJ SOLN
INTRAMUSCULAR | Status: DC | PRN
  Administered 2013-07-03: 2 mg via INTRAVENOUS

## 2013-07-03 MED ORDER — ONDANSETRON HCL 4 MG/2ML IJ SOLN: 4.0000 mg | Freq: Four times a day (QID) | INTRAMUSCULAR | Status: DC | PRN

## 2013-07-03 MED ORDER — SODIUM CHLORIDE 0.9 % IV SOLN
INTRAVENOUS | Status: DC
  Administered 2013-07-03: 19:00:00 via INTRAVENOUS
  Filled 2013-07-03 (×8): qty 1000

## 2013-07-03 MED ORDER — BUPIVACAINE IN DEXTROSE 0.75-8.25 % IT SOLN
INTRATHECAL | Status: DC | PRN
  Administered 2013-07-03: 2 mL via INTRATHECAL

## 2013-07-03 MED ORDER — DEXAMETHASONE SODIUM PHOSPHATE 10 MG/ML IJ SOLN
INTRAMUSCULAR | Status: AC
  Filled 2013-07-03: qty 1

## 2013-07-03 MED ORDER — ASPIRIN EC 325 MG PO TBEC
325.0000 mg | DELAYED_RELEASE_TABLET | Freq: Two times a day (BID) | ORAL | Status: DC
  Administered 2013-07-04: 325 mg via ORAL
  Filled 2013-07-03 (×3): qty 1

## 2013-07-03 MED ORDER — FLUTICASONE PROPIONATE 50 MCG/ACT NA SUSP
1.0000 | Freq: Every day | NASAL | Status: DC
  Filled 2013-07-03: qty 16

## 2013-07-03 SURGICAL SUPPLY — 57 items
ADH SKN CLS APL DERMABOND .7 (GAUZE/BANDAGES/DRESSINGS) ×1
BAG SPEC THK2 15X12 ZIP CLS (MISCELLANEOUS) ×1
BAG ZIPLOCK 12X15 (MISCELLANEOUS) ×2 IMPLANT
BANDAGE ELASTIC 6 VELCRO ST LF (GAUZE/BANDAGES/DRESSINGS) ×2 IMPLANT
BANDAGE ESMARK 6X9 LF (GAUZE/BANDAGES/DRESSINGS) ×1 IMPLANT
BLADE SAW SGTL 13.0X1.19X90.0M (BLADE) ×2 IMPLANT
BNDG CMPR 9X6 STRL LF SNTH (GAUZE/BANDAGES/DRESSINGS) ×1
BNDG ESMARK 6X9 LF (GAUZE/BANDAGES/DRESSINGS) ×2
BOWL SMART MIX CTS (DISPOSABLE) ×2 IMPLANT
CAPT RP KNEE ×1 IMPLANT
CUFF TOURN SGL QUICK 34 (TOURNIQUET CUFF) ×2
CUFF TRNQT CYL 34X4X40X1 (TOURNIQUET CUFF) ×1 IMPLANT
DECANTER SPIKE VIAL GLASS SM (MISCELLANEOUS) ×2 IMPLANT
DERMABOND ADVANCED (GAUZE/BANDAGES/DRESSINGS) ×1
DERMABOND ADVANCED .7 DNX12 (GAUZE/BANDAGES/DRESSINGS) ×1 IMPLANT
DRAPE EXTREMITY T 121X128X90 (DRAPE) ×2 IMPLANT
DRAPE POUCH INSTRU U-SHP 10X18 (DRAPES) ×2 IMPLANT
DRAPE U-SHAPE 47X51 STRL (DRAPES) ×2 IMPLANT
DRSG AQUACEL AG ADV 3.5X10 (GAUZE/BANDAGES/DRESSINGS) ×2 IMPLANT
DRSG TEGADERM 4X4.75 (GAUZE/BANDAGES/DRESSINGS) IMPLANT
DURAPREP 26ML APPLICATOR (WOUND CARE) ×4 IMPLANT
ELECT REM PT RETURN 9FT ADLT (ELECTROSURGICAL) ×2
ELECTRODE REM PT RTRN 9FT ADLT (ELECTROSURGICAL) ×1 IMPLANT
EVACUATOR 1/8 PVC DRAIN (DRAIN) IMPLANT
FACESHIELD LNG OPTICON STERILE (SAFETY) ×10 IMPLANT
GAUZE SPONGE 2X2 8PLY STRL LF (GAUZE/BANDAGES/DRESSINGS) IMPLANT
GLOVE BIOGEL PI IND STRL 7.5 (GLOVE) ×1 IMPLANT
GLOVE BIOGEL PI IND STRL 8 (GLOVE) ×1 IMPLANT
GLOVE BIOGEL PI INDICATOR 7.5 (GLOVE) ×1
GLOVE BIOGEL PI INDICATOR 8 (GLOVE) ×1
GLOVE ECLIPSE 8.0 STRL XLNG CF (GLOVE) ×2 IMPLANT
GLOVE ORTHO TXT STRL SZ7.5 (GLOVE) ×4 IMPLANT
GOWN SPEC L3 XXLG W/TWL (GOWN DISPOSABLE) ×2 IMPLANT
GOWN STRL REUS W/TWL LRG LVL3 (GOWN DISPOSABLE) ×2 IMPLANT
HANDPIECE INTERPULSE COAX TIP (DISPOSABLE) ×2
KIT BASIN OR (CUSTOM PROCEDURE TRAY) ×2 IMPLANT
MANIFOLD NEPTUNE II (INSTRUMENTS) ×2 IMPLANT
NDL SAFETY ECLIPSE 18X1.5 (NEEDLE) ×1 IMPLANT
NEEDLE HYPO 18GX1.5 SHARP (NEEDLE) ×2
NS IRRIG 1000ML POUR BTL (IV SOLUTION) ×2 IMPLANT
PACK TOTAL JOINT (CUSTOM PROCEDURE TRAY) ×2 IMPLANT
POSITIONER SURGICAL ARM (MISCELLANEOUS) ×2 IMPLANT
SET HNDPC FAN SPRY TIP SCT (DISPOSABLE) ×1 IMPLANT
SET PAD KNEE POSITIONER (MISCELLANEOUS) ×2 IMPLANT
SPONGE GAUZE 2X2 STER 10/PKG (GAUZE/BANDAGES/DRESSINGS)
SUCTION FRAZIER 12FR DISP (SUCTIONS) ×2 IMPLANT
SUT MNCRL AB 4-0 PS2 18 (SUTURE) ×2 IMPLANT
SUT VIC AB 1 CT1 36 (SUTURE) ×2 IMPLANT
SUT VIC AB 2-0 CT1 27 (SUTURE) ×6
SUT VIC AB 2-0 CT1 TAPERPNT 27 (SUTURE) ×3 IMPLANT
SUT VLOC 180 0 24IN GS25 (SUTURE) ×2 IMPLANT
SYR 50ML LL SCALE MARK (SYRINGE) ×2 IMPLANT
TOWEL OR 17X26 10 PK STRL BLUE (TOWEL DISPOSABLE) ×2 IMPLANT
TOWEL OR NON WOVEN STRL DISP B (DISPOSABLE) IMPLANT
TRAY FOLEY CATH 14FRSI W/METER (CATHETERS) ×2 IMPLANT
WATER STERILE IRR 1500ML POUR (IV SOLUTION) ×2 IMPLANT
WRAP KNEE MAXI GEL POST OP (GAUZE/BANDAGES/DRESSINGS) ×2 IMPLANT

## 2013-07-03 NOTE — Preoperative (Signed)
Beta Blockers   Reason not to administer Beta Blockers:Not Applicable 

## 2013-07-03 NOTE — Anesthesia Preprocedure Evaluation (Addendum)
Anesthesia Evaluation  Patient identified by MRN, date of birth, ID band Patient awake  General Assessment Comment: Left knee DJD  06/07/2013   .  Right carotid bruit  06/07/2013   .  Unspecified hypothyroidism  03/03/2012   .  Axillary mass, right  04/07/2011   .  ROSACEA  03/19/2010   .  ARTHRALGIA  03/19/2010   .  MYALGIA  03/19/2010   .  CERVICALGIA  09/05/2009   .  CARPAL TUNNEL SYNDROME  06/17/2008   .  DEGENERATIVE JOINT DISEASE, GENERALIZED  06/17/2008   .  CERVICAL RADICULOPATHY, LEFT  06/17/2008   .  PARESTHESIA, HANDS  06/17/2008   .  SLEEP DISORDER  03/04/2008   .  PROSTATE CANCER, HX OF  03/04/2008   .  HYPERLIPIDEMIA  06/15/2007   .  DIVERTICULOSIS, COLON W/O HEM  03/03/2007         Reviewed: Allergy & Precautions, H&P , NPO status , Patient's Chart, lab work & pertinent test results  Airway Mallampati: II TM Distance: >3 FB Neck ROM: Full    Dental no notable dental hx.    Pulmonary neg pulmonary ROS, former smoker,  breath sounds clear to auscultation  Pulmonary exam normal       Cardiovascular negative cardio ROS  Rhythm:Regular Rate:Normal + Systolic murmurs He denies any heart problems. Slight murmur per Dr. Linna Darner. No ECHO  1/6 sys murmur   Neuro/Psych  Neuromuscular disease negative psych ROS   GI/Hepatic negative GI ROS, Neg liver ROS,   Endo/Other  Hypothyroidism   Renal/GU negative Renal ROS  negative genitourinary   Musculoskeletal negative musculoskeletal ROS (+)   Abdominal   Peds negative pediatric ROS (+)  Hematology negative hematology ROS (+)   Anesthesia Other Findings   Reproductive/Obstetrics negative OB ROS                          Anesthesia Physical Anesthesia Plan  ASA: II  Anesthesia Plan: Spinal   Post-op Pain Management:    Induction: Intravenous  Airway Management Planned:   Additional Equipment:   Intra-op Plan:    Post-operative Plan:   Informed Consent: I have reviewed the patients History and Physical, chart, labs and discussed the procedure including the risks, benefits and alternatives for the proposed anesthesia with the patient or authorized representative who has indicated his/her understanding and acceptance.   Dental advisory given  Plan Discussed with: CRNA  Anesthesia Plan Comments: (Discussed risks/benefits of spinal including headache, backache, failure, bleeding, infection, and nerve damage. Patient consents to spinal. Questions answered. Coagulation studies and platelet count acceptable.)       Anesthesia Quick Evaluation

## 2013-07-03 NOTE — Interval H&P Note (Signed)
History and Physical Interval Note:  07/03/2013 9:35 AM  Jeremy Waters  has presented today for surgery, with the diagnosis of OSTEOARTHRITIS LEFT KNEE  The various methods of treatment have been discussed with the patient and family. After consideration of risks, benefits and other options for treatment, the patient has consented to  Procedure(s): LEFT TOTAL KNEE ARTHROPLASTY (Left) as a surgical intervention .  The patient's history has been reviewed, patient examined, no change in status, stable for surgery.  I have reviewed the patient's chart and labs.  Questions were answered to the patient's satisfaction.     Mauri Pole

## 2013-07-03 NOTE — Transfer of Care (Signed)
Immediate Anesthesia Transfer of Care Note  Patient: Jeremy Waters  Procedure(s) Performed: Procedure(s): LEFT TOTAL KNEE ARTHROPLASTY (Left)  Patient Location: PACU  Anesthesia Type:Spinal  Level of Consciousness: awake, alert , oriented and patient cooperative  Airway & Oxygen Therapy: Patient Spontanous Breathing and Patient connected to face mask oxygen  Post-op Assessment: Report given to PACU RN and Post -op Vital signs reviewed and stable  Post vital signs: Reviewed and stable  Complications: No apparent anesthesia complications

## 2013-07-03 NOTE — Anesthesia Postprocedure Evaluation (Signed)
  Anesthesia Post-op Note  Patient: Jeremy Waters  Procedure(s) Performed: Procedure(s) (LRB): LEFT TOTAL KNEE ARTHROPLASTY (Left)  Patient Location: PACU  Anesthesia Type: Spinal  Level of Consciousness: awake and alert   Airway and Oxygen Therapy: Patient Spontanous Breathing  Post-op Pain: mild  Post-op Assessment: Post-op Vital signs reviewed, Patient's Cardiovascular Status Stable, Respiratory Function Stable, Patent Airway and No signs of Nausea or vomiting  Last Vitals:  Filed Vitals:   07/03/13 1600  BP:   Pulse:   Temp:   Resp: 16    Post-op Vital Signs: stable   Complications: No apparent anesthesia complications

## 2013-07-03 NOTE — Op Note (Signed)
NAME:  Jeremy Waters                      MEDICAL RECORD NO.:  [id number]                             FACILITY:  [facility]      PHYSICIAN:  Pietro Cassis. Alvan Dame, M.D.  DATE OF BIRTH:  1945/06/21      DATE OF PROCEDURE:  07/03/2013                                     OPERATIVE REPORT         PREOPERATIVE DIAGNOSIS:  Left knee osteoarthritis.      POSTOPERATIVE DIAGNOSIS:  Left knee osteoarthritis.      FINDINGS:  The patient was noted to have complete loss of cartilage and   bone-on-bone arthritis with associated osteophytes in the lateral and patellofemoral compartments of   the knee with a significant synovitis and associated effusion.      PROCEDURE:  Left total knee replacement.      COMPONENTS USED:  DePuy rotating platform posterior stabilized knee   system, a size 4N femur, 3 tibia, 12.5 mm PS insert, and 38 patellar   button.      SURGEON:  Pietro Cassis. Alvan Dame, M.D.      ASSISTANT:  Danae Orleans, PA-C.      ANESTHESIA:  Spinal.      SPECIMENS:  None.      COMPLICATION:  None.      DRAINS:  One Hemovac.  EBL: <50cc      TOURNIQUET TIME:   Total Tourniquet Time Documented: Thigh (Left) - 40 minutes Total: Thigh (Left) - 40 minutes.      The patient was stable to the recovery room.      INDICATION FOR PROCEDURE:  Jeremy Waters is a 68 y.o. male patient of   mine.  The patient had been seen, evaluated, and treated conservatively in the   office with medication, activity modification, and injections.  The patient had   radiographic changes of bone-on-bone arthritis with endplate sclerosis and osteophytes noted.      The patient failed conservative measures including medication, injections, and activity modification, and at this point was ready for more definitive measures.   Based on the radiographic changes and failed conservative measures, the patient   decided to proceed with total knee replacement.  Risks of infection,   DVT, component failure, need for revision  surgery, postop course, and   expectations were all   discussed and reviewed.  Consent was obtained for benefit of pain   relief.      PROCEDURE IN DETAIL:  The patient was brought to the operative theater.   Once adequate anesthesia, preoperative antibiotics, 2 gm of Ancef administered, the patient was positioned supine with the left thigh tourniquet placed.  The  left lower extremity was prepped and draped in sterile fashion.  A time-   out was performed identifying the patient, planned procedure, and   extremity.      The left lower extremity was placed in the [facility] leg holder.  The leg was   exsanguinated, tourniquet elevated to 250 mmHg.  A midline incision was   made followed by median parapatellar arthrotomy.  Following initial   exposure, attention was first directed  to the patella.  Precut   measurement was noted to be 24 mm.  I resected down to 14-15 mm and used a   38 patellar button to restore patellar height as well as cover the cut   surface.      The lug holes were drilled and a metal shim was placed to protect the   patella from retractors and saw blades.      At this point, attention was now directed to the femur.  The femoral   canal was opened with a drill, irrigated to try to prevent fat emboli.  An   intramedullary rod was passed at 5 degrees valgus, 10 mm of bone was   resected off the distal femur.  Following this resection, the tibia was   subluxated anteriorly.  Using the extramedullary guide, 6 mm of bone was resected off   the proximal lateral tibia.  We confirmed the gap would be   stable medially and laterally with a 10 mm insert as well as confirmed   the cut was perpendicular in the coronal plane, checking with an alignment rod.      Once this was done, I sized the femur to be a size 4 in the anterior-   posterior dimension, chose a narrow component based on medial and   lateral dimension.  The size 4 rotation block was then pinned in   position  anterior referenced using the C-clamp to set rotation.  The   anterior, posterior, and  chamfer cuts were made without difficulty nor   notching making certain that I was along the anterior cortex to help   with flexion gap stability.      The final box cut was made off the lateral aspect of distal femur.      At this point, the tibia was sized to be a size 3, the size 3 tray was   then pinned in position through the medial third of the tubercle,   drilled, and keel punched.  Trial reduction was now carried with a 4N femur,  3 tibia, a 10 then 12.5 mm insert, and the 38 patella botton.  The knee was brought to   extension, full extension with good flexion stability with the patella   tracking through the trochlea without application of pressure.  Given   all these findings, the trial components removed.  Final components were   opened and cement was mixed.  The knee was irrigated with normal saline   solution and pulse lavage.  The synovial lining was   then injected with 20cc of Exparel, 30cc 0.25% Marcaine with epinephrine and 1 cc of Toradol,   total of 61 cc.      The knee was irrigated.  Final implants were then cemented onto clean and   dried cut surfaces of bone with the knee brought to extension with a 12.5 mm trial insert.      Once the cement had fully cured, the excess cement was removed   throughout the knee.  I confirmed I was satisfied with the range of   motion and stability, and the final 12.5 mm PS insert was chosen.  It was   placed into the knee.      The tourniquet had been let down at 40 minutes.  No significant   hemostasis required.  The medium Hemovac drain was placed deep.  The   extensor mechanism was then reapproximated using #1 Vicryl and #0 V-lock sutures with the  knee   in flexion.  The   remaining wound was closed with 2-0 Vicryl and running 4-0 Monocryl.   The knee was cleaned, dried, dressed sterilely using Dermabond and   Aquacel dressing.  Drain  site dressed separately.  The patient was then   brought to recovery room in stable condition, tolerating the procedure   well.   Please note that Physician Assistant, Danae Orleans, was present for the entirety of the case, and was utilized for pre-operative positioning, peri-operative retractor management, general facilitation of the procedure.  He was also utilized for primary wound closure at the end of the case.              Pietro Cassis Alvan Dame, M.D.    07/03/2013 3:11 PM

## 2013-07-03 NOTE — Anesthesia Procedure Notes (Signed)
Spinal  Patient location during procedure: OR Staffing Anesthesiologist: Salley Scarlet Performed by: anesthesiologist  Preanesthetic Checklist Completed: patient identified, site marked, surgical consent, pre-op evaluation, timeout performed, IV checked, risks and benefits discussed and monitors and equipment checked Spinal Block Patient position: sitting Prep: Betadine Patient monitoring: heart rate, continuous pulse ox and blood pressure Approach: midline Location: L3-4 Injection technique: single-shot Needle Needle type: Sprotte  Needle gauge: 24 G Needle length: 9 cm Additional Notes Expiration date of kit checked and confirmed. Patient tolerated procedure well, without complications. CSF clear. No paresthesia.

## 2013-07-03 NOTE — Progress Notes (Signed)
Utilization review completed.  

## 2013-07-04 LAB — CBC
HCT: 37.8 % — ABNORMAL LOW (ref 39.0–52.0)
Hemoglobin: 12.6 g/dL — ABNORMAL LOW (ref 13.0–17.0)
MCH: 32.2 pg (ref 26.0–34.0)
MCHC: 33.3 g/dL (ref 30.0–36.0)
MCV: 96.7 fL (ref 78.0–100.0)
Platelets: 214 10*3/uL (ref 150–400)
RBC: 3.91 MIL/uL — ABNORMAL LOW (ref 4.22–5.81)
RDW: 13.6 % (ref 11.5–15.5)
WBC: 11.3 10*3/uL — ABNORMAL HIGH (ref 4.0–10.5)

## 2013-07-04 LAB — BASIC METABOLIC PANEL
BUN: 13 mg/dL (ref 6–23)
CO2: 22 mEq/L (ref 19–32)
Calcium: 8.1 mg/dL — ABNORMAL LOW (ref 8.4–10.5)
Chloride: 103 mEq/L (ref 96–112)
Creatinine, Ser: 0.76 mg/dL (ref 0.50–1.35)
GFR calc Af Amer: >90 mL/min (ref >90)
GFR calc non Af Amer: >90 mL/min (ref >90)
Glucose, Bld: 151 mg/dL — ABNORMAL HIGH (ref 70–99)
Potassium: 4.5 mEq/L (ref 3.7–5.3)
Sodium: 138 mEq/L (ref 137–147)

## 2013-07-04 MED ORDER — HYDROCODONE-ACETAMINOPHEN 7.5-325 MG PO TABS: 1.0000 | ORAL_TABLET | ORAL | Status: DC

## 2013-07-04 MED ORDER — POLYETHYLENE GLYCOL 3350 17 G PO PACK: 17.0000 g | PACK | Freq: Two times a day (BID) | ORAL | Status: DC

## 2013-07-04 MED ORDER — TIZANIDINE HCL 4 MG PO CAPS: 4.0000 mg | ORAL_CAPSULE | Freq: Three times a day (TID) | ORAL | Status: DC | PRN

## 2013-07-04 MED ORDER — ASPIRIN 325 MG PO TBEC: 325.0000 mg | DELAYED_RELEASE_TABLET | Freq: Two times a day (BID) | ORAL | Status: AC

## 2013-07-04 MED ORDER — FERROUS SULFATE 325 (65 FE) MG PO TABS: 325.0000 mg | ORAL_TABLET | Freq: Three times a day (TID) | ORAL | Status: DC

## 2013-07-04 MED ORDER — DSS 100 MG PO CAPS: 100.0000 mg | ORAL_CAPSULE | Freq: Two times a day (BID) | ORAL | Status: DC

## 2013-07-04 NOTE — Progress Notes (Signed)
Physical Therapy Treatment Patient Details Name: Jeremy Waters MRN: [id number] DOB: 1946-01-16 Today's Date: 07/04/2013 Time: 4431-5400 PT Time Calculation (min): 14 min  PT Assessment / Plan / Recommendation  History of Present Illness TKR   PT Comments   Ready for d/c from PT perspective  Follow Up Recommendations  Home health PT     Does the patient have the potential to tolerate intense rehabilitation     Barriers to Discharge        Equipment Recommendations  None recommended by PT    Recommendations for Other Services OT consult  Frequency 7X/week   Progress towards PT Goals Progress towards PT goals: Progressing toward goals  Plan Current plan remains appropriate    Precautions / Restrictions Precautions Precautions: Fall Restrictions Weight Bearing Restrictions: No Other Position/Activity Restrictions: WBAT   Pertinent Vitals/Pain 3/10; premed, cold packs provided    Mobility  Bed Mobility Overal bed mobility: Needs Assistance Bed Mobility: Supine to Sit Supine to sit: Supervision General bed mobility comments: cues for use of R LE and sequence Transfers Overall transfer level: Needs assistance Equipment used: Rolling walker (2 wheeled) Transfers: Sit to/from Stand Sit to Stand: Supervision General transfer comment: cues for LE management and use of UEs to self assist Ambulation/Gait Ambulation/Gait assistance: Min guard;Supervision Ambulation Distance (Feet): 150 Feet Assistive device: Rolling walker (2 wheeled) Gait Pattern/deviations: Step-to pattern;Step-through pattern;Shuffle;Trunk flexed General Gait Details: cues for sequence, posture, stride length, position from RW Stairs: Yes Stairs assistance: Min guard Stair Management: One rail Right;Forwards;With cane;Step to pattern Number of Stairs: 10 General stair comments: cues for sequence and foot/QC placement    Exercises     PT Diagnosis:    PT Problem List:   PT Treatment  Interventions:     PT Goals (current goals can now be found in the care plan section) Acute Rehab PT Goals Patient Stated Goal: Resume previous lifestyle with decreased pain` PT Goal Formulation: With patient Time For Goal Achievement: 07/11/13 Potential to Achieve Goals: Good  Visit Information  Last PT Received On: 07/04/13 Assistance Needed: +1 History of Present Illness: TKR    Subjective Data  Subjective: Ready to go home Patient Stated Goal: Resume previous lifestyle with decreased pain`   Cognition  Cognition Arousal/Alertness: Awake/alert Behavior During Therapy: WFL for tasks assessed/performed Overall Cognitive Status: Within Functional Limits for tasks assessed    Balance     End of Session PT - End of Session Equipment Utilized During Treatment: Gait belt Activity Tolerance: Patient tolerated treatment well Patient left: in chair;with call bell/phone within reach;with family/visitor present Nurse Communication: Mobility status   GP     Jeremy Waters 07/04/2013, 3:58 PM

## 2013-07-04 NOTE — Progress Notes (Signed)
   Subjective: 1 Day Post-Op Procedure(s) (LRB): LEFT TOTAL KNEE ARTHROPLASTY (Left)   Patient reports pain as mild, pain well controlled. No events throughout the night. Ready to be discharged home.  Objective:   VITALS:   Filed Vitals:   07/04/13 0550  BP: 114/69  Pulse: 57  Temp: 97.9 F (36.6 C)  Resp: 17    Neurovascular intact Dorsiflexion/Plantar flexion intact Incision: dressing C/D/I No cellulitis present Compartment soft  LABS  Recent Labs  07/04/13 0423  HGB 12.6*  HCT 37.8*  WBC 11.3*  PLT 214     Recent Labs  07/04/13 0423  NA 138  K 4.5  BUN 13  CREATININE 0.76  GLUCOSE 151*     Assessment/Plan: 1 Day Post-Op Procedure(s) (LRB): LEFT TOTAL KNEE ARTHROPLASTY (Left) Foley cath d/c'ed Advance diet Up with therapy D/C IV fluids Discharge home with home health Follow up in 2 weeks at [facility]. Follow up with OLIN,Dorthy Magnussen D in 2 weeks.  Contact information:  [facility] [address] [place] Katja Blue   PAC  07/04/2013, 9:08 AM

## 2013-07-04 NOTE — Progress Notes (Signed)
[facility]  [facility] is providing the following services: Patient declined rw and commode. Already has at home.   If patient discharges after hours, please call [phone].   Linward Headland 07/04/2013, 11:12 AM

## 2013-07-04 NOTE — Evaluation (Signed)
Occupational Therapy Evaluation Patient Details Name: Jeremy Waters MRN: [id number] DOB: [birth year] Today's Date: 07/04/2013 Time: 1207-1230 OT Time Calculation (min): 23 min  OT Assessment / Plan / Recommendation History of present illness TKR   Clinical Impression   All education complete s/p TKR regarding ADL activity    OT Assessment  Patient does not need any further OT services    Follow Up Recommendations  No OT follow up                Precautions / Restrictions Precautions Precautions: Fall Restrictions Weight Bearing Restrictions: No Other Position/Activity Restrictions: WBAT       ADL  Grooming: Set up Where Assessed - Grooming: Unsupported standing Lower Body Bathing: Supervision/safety Where Assessed - Lower Body Bathing: Unsupported sit to stand Lower Body Dressing: Set up Where Assessed - Lower Body Dressing: Unsupported sit to stand Toilet Transfer: Supervision/safety Toilet Transfer Method: Sit to Loss adjuster, chartered: Comfort height toilet Where Assessed - Best boy and Hygiene: Standing Tub/Shower Transfer: English as a second language teacher Method: Therapist, art: Walk in shower ADL Comments: Education complete      OT Goals(Current goals can be found in the care plan section) Acute Rehab OT Goals Patient Stated Goal: Resume previous lifestyle with decreased pain`  Visit Information  Last OT Received On: 07/04/13 Assistance Needed: +1 History of Present Illness: TKR       Prior [place] expects to be discharged to:: Private residence Living Arrangements: Spouse/significant other Available Help at Discharge: Family Type of Home: House Home Access: Stairs to enter Technical brewer of Steps: 12 Entrance Stairs-Rails: Right Home Layout: Able to live on main level with bedroom/bathroom Home Equipment: Walker - 2 wheels;Cane -  quad Prior Function Level of Independence: Independent Communication Communication: No difficulties Dominant Hand: Right         Vision/Perception Vision - History Patient Visual Report: No change from baseline   Cognition  Cognition Arousal/Alertness: Awake/alert Behavior During Therapy: WFL for tasks assessed/performed Overall Cognitive Status: Within Functional Limits for tasks assessed    Extremity/Trunk Assessment Upper Extremity Assessment Upper Extremity Assessment: Overall WFL for tasks assessed Lower Extremity Assessment Lower Extremity Assessment: LLE deficits/detail LLE Deficits / Details: 3/5 quads with AAROM -10 - 90     Mobility Bed Mobility Overal bed mobility: Needs Assistance Bed Mobility: Supine to Sit Supine to sit: Supervision General bed mobility comments: cues for use of R LE and sequence Transfers Overall transfer level: Needs assistance Equipment used: Rolling walker (2 wheeled) Transfers: Sit to/from Stand Sit to Stand: Supervision General transfer comment: cues for LE management and use of UEs to self assist     Exercise Total Joint Exercises Ankle Circles/Pumps: AROM;10 reps;Supine;Both Quad Sets: AROM;Both;10 reps;Supine Heel Slides: AAROM;15 reps;Left;Supine Straight Leg Raises: AAROM;AROM;Left;15 reps;Supine      End of Session OT - End of Session Activity Tolerance: Patient tolerated treatment well Patient left: in chair  GO     Ajo, Thereasa Parkin 07/04/2013, 12:36 PM

## 2013-07-04 NOTE — Evaluation (Signed)
Physical Therapy Evaluation Patient Details Name: Jeremy Waters MRN: [id number] DOB: Dec 20, 1945 Today's Date: 07/04/2013 Time: 9242-6834 PT Time Calculation (min): 32 min  PT Assessment / Plan / Recommendation History of Present Illness     Clinical Impression  Pt s/p L TKR presents with decreased L LE strength/ROM and post op pain limiting functional mobility.  Pt should progress well to d/c home with family assist and HHPT     PT Assessment  Patient needs continued PT services    Follow Up Recommendations  Home health PT    Does the patient have the potential to tolerate intense rehabilitation      Barriers to Discharge        Equipment Recommendations  None recommended by PT    Recommendations for Other Services OT consult   Frequency 7X/week    Precautions / Restrictions Precautions Precautions: Fall Restrictions Weight Bearing Restrictions: No Other Position/Activity Restrictions: WBAT   Pertinent Vitals/Pain 3/10; premed, ice packs provided      Mobility  Bed Mobility Overal bed mobility: Needs Assistance Bed Mobility: Supine to Sit Supine to sit: Min guard General bed mobility comments: cues for use of R LE and sequence Transfers Overall transfer level: Needs assistance Equipment used: Rolling walker (2 wheeled) Transfers: Sit to/from Stand Sit to Stand: Min guard General transfer comment: cues for LE management and use of UEs to self assist Ambulation/Gait Ambulation/Gait assistance: Min guard Ambulation Distance (Feet): 250 Feet Assistive device: Rolling walker (2 wheeled) Gait Pattern/deviations: Step-to pattern;Step-through pattern;Shuffle;Decreased step length - right;Decreased step length - left;Trunk flexed;Antalgic General Gait Details: cues for sequence, posture, stride length, position from RW    Exercises Total Joint Exercises Ankle Circles/Pumps: AROM;10 reps;Supine;Both Quad Sets: AROM;Both;10 reps;Supine Heel Slides: AAROM;15  reps;Left;Supine Straight Leg Raises: AAROM;AROM;Left;15 reps;Supine   PT Diagnosis: Difficulty walking  PT Problem List: Decreased strength;Decreased range of motion;Decreased activity tolerance;Decreased mobility;Decreased knowledge of use of DME;Pain PT Treatment Interventions: DME instruction;Gait training;Stair training;Functional mobility training;Therapeutic activities;Therapeutic exercise;Patient/family education     PT Goals(Current goals can be found in the care plan section) Acute Rehab PT Goals Patient Stated Goal: Resume previous lifestyle with decreased pain` PT Goal Formulation: With patient Time For Goal Achievement: 07/11/13 Potential to Achieve Goals: Good  Visit Information  Last PT Received On: 07/04/13 Assistance Needed: +1       Prior [place] expects to be discharged to:: Private residence Living Arrangements: Spouse/significant other Available Help at Discharge: Family Type of Home: House Home Access: Stairs to enter Technical brewer of Steps: 12 Entrance Stairs-Rails: Right Home Layout: Able to live on main level with bedroom/bathroom Home Equipment: Walker - 2 wheels;Cane - quad Prior Function Level of Independence: Independent Communication Communication: No difficulties Dominant Hand: Right    Cognition  Cognition Arousal/Alertness: Awake/alert Behavior During Therapy: WFL for tasks assessed/performed Overall Cognitive Status: Within Functional Limits for tasks assessed    Extremity/Trunk Assessment Upper Extremity Assessment Upper Extremity Assessment: Overall WFL for tasks assessed Lower Extremity Assessment Lower Extremity Assessment: LLE deficits/detail LLE Deficits / Details: 3/5 quads with AAROM -10 - 90   Balance    End of Session PT - End of Session Equipment Utilized During Treatment: Gait belt Activity Tolerance: Patient tolerated treatment well Patient left: in chair;with call bell/phone  within reach;with family/visitor present Nurse Communication: Mobility status  GP     Tauri Ethington 07/04/2013, 12:20 PM

## 2013-07-05 NOTE — Progress Notes (Signed)
Discharge summary sent to payer through [facility]  

## 2013-07-11 NOTE — Discharge Summary (Signed)
Physician Discharge Summary  Patient ID: Jeremy Waters MRN: [id number] DOB/AGE: 1946/01/04 68 y.o.  Admit date: 07/03/2013 Discharge date: 07/04/2013   Procedures:  Procedure(s) (LRB): LEFT TOTAL KNEE ARTHROPLASTY (Left)  Attending Physician:  Dr. Paralee Cancel   Admission Diagnoses:   Left knee OA / pain  Discharge Diagnoses:  Principal Problem:   S/P left TKA  Past Medical History  Diagnosis Date  . Diverticulosis of colon     w/o hemorrage  . Hyperlipidemia   . Prostate cancer   . Colon polyp   . Inguinal hernia     left side   . Heart murmur     TOLD HE HAS A SLIGHT MURMUR  . Arthritis     OA / PAIN LEFT KNEE    HPI: Jeremy Waters, 68 y.o. male, has a history of pain and functional disability in the left knee due to trauma and arthritis and has failed non-surgical conservative treatments for greater than 12 weeks to include NSAID's and/or analgesics, use of assistive devices and activity modification. Onset of symptoms was gradual, starting >10 years ago with gradually worsening course since that time. The patient noted prior procedures on the knee to include ORIF on the left knee(s). Patient currently rates pain in the left knee(s) at 6 out of 10 with activity. Patient has worsening of pain with activity and weight bearing, pain that interferes with activities of daily living, pain with passive range of motion, crepitus and joint swelling. Patient has evidence of periarticular osteophytes and joint space narrowing by imaging studies. There is no active infection. Risks, benefits and expectations were discussed with the patient. Risks including but not limited to the risk of anesthesia, blood clots, nerve damage, blood vessel damage, failure of the prosthesis, infection and up to and including death. Patient understand the risks, benefits and expectations and wishes to proceed with surgery.   PCP: Unice Cobble, MD   Discharged Condition: good  Hospital Course:   Patient underwent the above stated procedure on 07/03/2013. Patient tolerated the procedure well and brought to the recovery room in good condition and subsequently to the floor.  POD #1 BP: 114/69 ; Pulse: 57 ; Temp: 97.9 F (36.6 C) ; Resp: 17 Patient reports pain as mild, pain well controlled. No events throughout the night. Ready to be discharged home. Neurovascular intact, dorsiflexion/plantar flexion intact, incision: dressing C/D/I, no cellulitis present and compartment soft.   LABS  Basename    HGB  12.6  HCT  37.8    Discharge Exam: General appearance: alert, cooperative and no distress Extremities: Homans sign is negative, no sign of DVT, no edema, redness or tenderness in the calves or thighs and no ulcers, gangrene or trophic changes  Disposition:   Home-Health Care Svc with follow up in 2 weeks   Follow-up Information   Follow up with Mauri Pole, MD. Schedule an appointment as soon as possible for a visit in 2 weeks.   Specialty:  Orthopedic Surgery   Contact information:   [address] [place] [address] [phone]       Discharge Orders   Future Orders Complete By Expires   Call MD / Call 911  As directed    Comments:     If you experience chest pain or shortness of breath, CALL 911 and be transported to the hospital emergency room.  If you develope a fever above 101 F, pus (white drainage) or increased drainage or redness at the wound,  or calf pain, call your surgeon's office.   Change dressing  As directed    Comments:     Maintain surgical dressing for 10-14 days, or until follow up in the clinic.   Constipation Prevention  As directed    Comments:     Drink plenty of fluids.  Prune juice may be helpful.  You may use a stool softener, such as Colace (over the counter) 100 mg twice a day.  Use MiraLax (over the counter) for constipation as needed.   Diet - low sodium heart healthy  As directed    Discharge instructions  As directed     Comments:     Maintain surgical dressing for 10-14 days, or until follow up in the clinic. Follow up in 2 weeks at [facility]. Call with any questions or concerns.   Increase activity slowly as tolerated  As directed    TED hose  As directed    Comments:     Use stockings (TED hose) for 2 weeks on both leg(s).  You may remove them at night for sleeping.   Weight bearing as tolerated  As directed    Questions:     Laterality:     Extremity:          Medication List         aspirin 325 MG EC tablet  Take 1 tablet (325 mg total) by mouth 2 (two) times daily.     beta carotene 30 MG capsule  Take 30 mg by mouth daily.     clonazePAM 0.5 MG tablet  Commonly known as:  KLONOPIN  Take 0.5 mg by mouth at bedtime.     DSS 100 MG Caps  Take 100 mg by mouth 2 (two) times daily.     ferrous sulfate 325 (65 FE) MG tablet  Take 1 tablet (325 mg total) by mouth 3 (three) times daily after meals.     fluticasone 50 MCG/ACT nasal spray  Commonly known as:  FLONASE  Place 1 spray into both nostrils daily.     HYDROcodone-acetaminophen 7.5-325 MG per tablet  Commonly known as:  NORCO  Take 1-2 tablets by mouth every 4 (four) hours.     levothyroxine 25 MCG tablet  Commonly known as:  SYNTHROID, LEVOTHROID  Take 25 mcg by mouth daily before breakfast.     polyethylene glycol packet  Commonly known as:  MIRALAX / GLYCOLAX  Take 17 g by mouth 2 (two) times daily.     pravastatin 20 MG tablet  Commonly known as:  PRAVACHOL  Take 20 mg by mouth every evening.     tiZANidine 4 MG capsule  Commonly known as:  ZANAFLEX  Take 1 capsule (4 mg total) by mouth 3 (three) times daily as needed for muscle spasms.     vitamin C 500 MG tablet  Commonly known as:  ASCORBIC ACID  Take 500 mg by mouth 2 (two) times daily.     vitamin E 400 UNIT capsule  Take 400 Units by mouth daily.         Signed: [place] P[place]. Dare Spillman   PAC  07/11/2013, 12:27 PM

## 2013-07-13 ENCOUNTER — Other Ambulatory Visit: Payer: Self-pay | Admitting: *Deleted

## 2013-07-13 MED ORDER — FLUTICASONE PROPIONATE 50 MCG/ACT NA SUSP: 1.0000 | Freq: Every day | NASAL | Status: DC

## 2013-07-13 NOTE — Telephone Encounter (Signed)
Rx sent to the pharmacy by e-script.//AB/CMA 

## 2013-07-23 ENCOUNTER — Other Ambulatory Visit: Payer: Self-pay | Admitting: Internal Medicine

## 2013-07-24 NOTE — Telephone Encounter (Signed)
OK X1 

## 2013-07-24 NOTE — Telephone Encounter (Signed)
Faxed hardcopy of Klonopin to [facility]

## 2013-07-24 NOTE — Telephone Encounter (Signed)
[facility] [place]

## 2013-08-02 ENCOUNTER — Other Ambulatory Visit (HOSPITAL_COMMUNITY): Payer: Self-pay | Admitting: Cardiology

## 2013-08-02 DIAGNOSIS — I6529 Occlusion and stenosis of unspecified carotid artery: Secondary | ICD-10-CM

## 2013-08-06 ENCOUNTER — Ambulatory Visit (HOSPITAL_COMMUNITY): Payer: Medicare Other | Attending: Internal Medicine | Admitting: *Deleted

## 2013-08-06 ENCOUNTER — Encounter: Payer: Self-pay | Admitting: Internal Medicine

## 2013-08-06 DIAGNOSIS — I6529 Occlusion and stenosis of unspecified carotid artery: Secondary | ICD-10-CM | POA: Insufficient documentation

## 2013-08-06 NOTE — Progress Notes (Signed)
Carotid Duplex complete 

## 2013-09-04 ENCOUNTER — Encounter: Payer: Self-pay | Admitting: Internal Medicine

## 2013-09-10 ENCOUNTER — Other Ambulatory Visit: Payer: Self-pay | Admitting: Internal Medicine

## 2013-09-10 MED ORDER — CLONAZEPAM 0.5 MG PO TABS: ORAL_TABLET | ORAL | Status: DC

## 2013-09-11 ENCOUNTER — Encounter: Payer: Self-pay | Admitting: Internal Medicine

## 2013-09-11 MED ORDER — LEVOTHYROXINE SODIUM 25 MCG PO TABS: 25.0000 ug | ORAL_TABLET | Freq: Every day | ORAL | Status: DC

## 2013-09-27 ENCOUNTER — Encounter: Payer: Self-pay | Admitting: Internal Medicine

## 2013-09-27 MED ORDER — PRAVASTATIN SODIUM 20 MG PO TABS: 20.0000 mg | ORAL_TABLET | Freq: Every evening | ORAL | Status: DC

## 2013-11-07 ENCOUNTER — Other Ambulatory Visit: Payer: Self-pay | Admitting: Internal Medicine

## 2013-11-07 ENCOUNTER — Encounter: Payer: Self-pay | Admitting: Internal Medicine

## 2013-11-07 MED ORDER — CLONAZEPAM 0.5 MG PO TABS: ORAL_TABLET | ORAL | Status: DC

## 2013-11-13 ENCOUNTER — Other Ambulatory Visit: Payer: Self-pay

## 2013-11-13 MED ORDER — CLONAZEPAM 0.5 MG PO TABS: ORAL_TABLET | ORAL | Status: DC

## 2013-11-13 NOTE — Telephone Encounter (Signed)
#  30, R X 1 

## 2014-01-24 ENCOUNTER — Encounter: Payer: Self-pay | Admitting: Internal Medicine

## 2014-01-24 ENCOUNTER — Ambulatory Visit (INDEPENDENT_AMBULATORY_CARE_PROVIDER_SITE_OTHER): Payer: Medicare Other | Admitting: Internal Medicine

## 2014-01-24 VITALS — BP 136/86 | HR 58 | Temp 98.2°F | Wt 190.1 lb

## 2014-01-24 DIAGNOSIS — K4090 Unilateral inguinal hernia, without obstruction or gangrene, not specified as recurrent: Secondary | ICD-10-CM

## 2014-01-24 NOTE — Progress Notes (Signed)
Pre visit review using our clinic review tool, if applicable. No additional management support is needed unless otherwise documented below in the visit note. 

## 2014-01-24 NOTE — Progress Notes (Signed)
   Subjective:    Patient ID: Jeremy Waters, male    DOB: 08-01-1945, 68 y.o.   MRN: [id number]  HPI   He describes several days of burning pain in left inguinal area without specific trigger or injury  It is described as intermittent up to level V and lasting minutes. It is nonradiating  Walking or standing seems to increase it slightly. This is better when supine  He has a history of bilateral hernia repairs.  GI and GU review of systems are negative except for occasional hemorrhoidal bleeding.    Review of Systems Unexplained weight loss, abdominal pain, significant dyspepsia, dysphagia, melena,  or persistently small caliber stools are denied. Dysuria, pyuria, hematuria, frequency, nocturia or polyuria are denied. No radicular symptoms.    Objective:   Physical Exam  Pertinent positive findings include: He does have varices in the L scrotum. No direct or indirect inguinal hernia is presentclinically. He does have marked crepitus of left knee with decreased range of motion. He has isolated DIP osteoarthritic changes of hands.  General appearance :adequately nourished; in no distress. Eyes: No conjunctival inflammation or scleral icterus is present. Heart:  Normal rate and regular rhythm. S1 and S2 normal without gallop, murmur, click, rub or other extra sounds   Lungs:Chest clear to auscultation; no wheezes, rhonchi,rales ,or rubs present.No increased work of breathing.  Abdomen: bowel sounds normal, soft and non-tender without masses, organomegaly or hernias noted.  No guarding or rebound. No flank tenderness to percussion. Neg SLR Skin:Warm & dry.  Intact without suspicious lesions or rashes ; no jaundice or tenting Lymphatic: No lymphadenopathy is noted about the head, neck, axilla, or inguinal areas.         Assessment & Plan:  #1 probable small direct inguinal hernia Plan: monitor for progression

## 2014-01-24 NOTE — Patient Instructions (Signed)
Avoid heavy lifting because of  the hernia. Apply pressure to this area when coughing or sneezing. Please do not eat &  be seen immediately if there is persistent  severe pain in this area. 

## 2014-01-28 ENCOUNTER — Other Ambulatory Visit: Payer: Self-pay | Admitting: Internal Medicine

## 2014-01-28 DIAGNOSIS — R1032 Left lower quadrant pain: Secondary | ICD-10-CM

## 2014-02-11 ENCOUNTER — Other Ambulatory Visit (INDEPENDENT_AMBULATORY_CARE_PROVIDER_SITE_OTHER): Payer: Medicare Other

## 2014-02-11 ENCOUNTER — Encounter: Payer: Self-pay | Admitting: Internal Medicine

## 2014-02-11 ENCOUNTER — Ambulatory Visit (INDEPENDENT_AMBULATORY_CARE_PROVIDER_SITE_OTHER): Payer: Medicare Other | Admitting: Internal Medicine

## 2014-02-11 VITALS — BP 118/78 | HR 65 | Temp 98.1°F | Resp 20 | Ht 68.0 in | Wt 191.0 lb

## 2014-02-11 DIAGNOSIS — R197 Diarrhea, unspecified: Secondary | ICD-10-CM

## 2014-02-11 LAB — BASIC METABOLIC PANEL
BUN: 15 mg/dL (ref 6–23)
CO2: 25 mEq/L (ref 19–32)
Calcium: 9.2 mg/dL (ref 8.4–10.5)
Chloride: 101 mEq/L (ref 96–112)
Creatinine, Ser: 0.9 mg/dL (ref 0.4–1.5)
GFR: 91.82 mL/min (ref >60.00)
Glucose, Bld: 102 mg/dL — ABNORMAL HIGH (ref 70–99)
Potassium: 3.8 mEq/L (ref 3.5–5.1)
Sodium: 138 mEq/L (ref 135–145)

## 2014-02-11 NOTE — Progress Notes (Signed)
Pre visit review using our clinic review tool, if applicable. No additional management support is needed unless otherwise documented below in the visit note. 

## 2014-02-11 NOTE — Patient Instructions (Signed)
We will have you take a stool softener until you start moving your bowels again. All the imodium can make you constipated and stool hard to pass. If you have loose stools or diarrhea stop taking the stool softener.   If you are still having discomfort in you low stomach at the end of the week please call us back.  Come back if you have more pain, nausea/vomiting, or have more diarrhea.   Food Choices to Help Relieve Diarrhea When you have diarrhea, the foods you eat and your eating habits are very important. Choosing the right foods and drinks can help relieve diarrhea. Also, because diarrhea can last up to 7 days, you need to replace lost fluids and electrolytes (such as sodium, potassium, and chloride) in order to help prevent dehydration.  WHAT GENERAL GUIDELINES DO I NEED TO FOLLOW?  Slowly drink 1 cup (8 oz) of fluid for each episode of diarrhea. If you are getting enough fluid, your urine will be clear or pale yellow.  Eat starchy foods. Some good choices include white rice, white toast, pasta, low-fiber cereal, baked potatoes (without the skin), saltine crackers, and bagels.  Avoid large servings of any cooked vegetables.  Limit fruit to two servings per day. A serving is  cup or 1 small piece.  Choose foods with less than 2 g of fiber per serving.  Limit fats to less than 8 tsp (38 g) per day.  Avoid fried foods.  Eat foods that have probiotics in them. Probiotics can be found in certain dairy products.  Avoid foods and beverages that may increase the speed at which food moves through the stomach and intestines (gastrointestinal tract). Things to avoid include:  High-fiber foods, such as dried fruit, raw fruits and vegetables, nuts, seeds, and whole grain foods.  Spicy foods and high-fat foods.  Foods and beverages sweetened with high-fructose corn syrup, honey, or sugar alcohols such as xylitol, sorbitol, and mannitol. WHAT FOODS ARE RECOMMENDED? Grains White rice. White,  [place], or pita breads (fresh or toasted), including plain rolls, buns, or bagels. White pasta. Saltine, soda, or graham crackers. Pretzels. Low-fiber cereal. Cooked cereals made with water (such as cornmeal, farina, or cream cereals). Plain muffins. Matzo. Melba toast. Zwieback.  Vegetables Potatoes (without the skin). Strained tomato and vegetable juices. Most well-cooked and canned vegetables without seeds. Tender lettuce. Fruits Cooked or canned applesauce, apricots, cherries, fruit cocktail, grapefruit, peaches, pears, or plums. Fresh bananas, apples without skin, cherries, grapes, cantaloupe, grapefruit, peaches, oranges, or plums.  Meat and Other Protein Products Baked or boiled chicken. Eggs. Tofu. Fish. Seafood. Smooth peanut butter. Ground or well-cooked tender beef, ham, veal, lamb, pork, or poultry.  Dairy Plain yogurt, kefir, and unsweetened liquid yogurt. Lactose-free milk, buttermilk, or soy milk. Plain hard cheese. Beverages Sport drinks. Clear broths. Diluted fruit juices (except prune). Regular, caffeine-free sodas such as ginger ale. Water. Decaffeinated teas. Oral rehydration solutions. Sugar-free beverages not sweetened with sugar alcohols. Other Bouillon, broth, or soups made from recommended foods.  The items listed above may not be a complete list of recommended foods or beverages. Contact your dietitian for more options. WHAT FOODS ARE NOT RECOMMENDED? Grains Whole grain, whole wheat, bran, or rye breads, rolls, pastas, crackers, and cereals. Wild or brown rice. Cereals that contain more than 2 g of fiber per serving. Corn tortillas or taco shells. Cooked or dry oatmeal. Granola. Popcorn. Vegetables Raw vegetables. Cabbage, broccoli, Brussels sprouts, artichokes, baked beans, beet greens, corn, kale, legumes, peas, sweet potatoes,  and yams. Potato skins. Cooked spinach and cabbage. Fruits Dried fruit, including raisins and dates. Raw fruits. Stewed or dried prunes. Fresh  apples with skin, apricots, mangoes, pears, raspberries, and strawberries.  Meat and Other Protein Products Chunky peanut butter. Nuts and seeds. Beans and lentils. Berniece Salines.  Dairy High-fat cheeses. Milk, chocolate milk, and beverages made with milk, such as milk shakes. Cream. Ice cream. Sweets and Desserts Sweet rolls, doughnuts, and sweet breads. Pancakes and waffles. Fats and Oils Butter. Cream sauces. Margarine. Salad oils. Plain salad dressings. Olives. Avocados.  Beverages Caffeinated beverages (such as coffee, tea, soda, or energy drinks). Alcoholic beverages. Fruit juices with pulp. Prune juice. Soft drinks sweetened with high-fructose corn syrup or sugar alcohols. Other Coconut. Hot sauce. Chili powder. Mayonnaise. Gravy. Cream-based or milk-based soups.  The items listed above may not be a complete list of foods and beverages to avoid. Contact your dietitian for more information. WHAT SHOULD I DO IF I BECOME DEHYDRATED? Diarrhea can sometimes lead to dehydration. Signs of dehydration include dark urine and dry mouth and skin. If you think you are dehydrated, you should rehydrate with an oral rehydration solution. These solutions can be purchased at pharmacies, retail stores, or online.  Drink -1 cup (120-240 mL) of oral rehydration solution each time you have an episode of diarrhea. If drinking this amount makes your diarrhea worse, try drinking smaller amounts more often. For example, drink 1-3 tsp (5-15 mL) every 5-10 minutes.  A general rule for staying hydrated is to drink 1-2 L of fluid per day. Talk to your health care provider about the specific amount you should be drinking each day. Drink enough fluids to keep your urine clear or pale yellow. Document Released: 07/31/2003 Document Revised: 05/15/2013 Document Reviewed: 04/02/2013 [facility] Patient Information 2015 [place], [place]. This information is not intended to replace advice given to you by your health care provider. Make  sure you discuss any questions you have with your health care provider.

## 2014-02-12 ENCOUNTER — Ambulatory Visit: Payer: Medicare Other | Admitting: Internal Medicine

## 2014-02-12 DIAGNOSIS — R197 Diarrhea, unspecified: Secondary | ICD-10-CM | POA: Insufficient documentation

## 2014-02-12 NOTE — Assessment & Plan Note (Signed)
Most likely to be viral etiology and seems to be resolved. Due to taking excessive amounts of imodium (3-4 per day for 3 days) and now no bowel movement in 1 day advised use of stool softener once per day so he does not get constipated. He will take tylenol for the abdominal discomfort (likely due to the gas residual in his abdomen which should resolve with normal bowel movements). If he does have recurrent diarrhea would test for giardia but no indication at this time since diarrhea is resolved. Will check BMP to ensure no dehydration injury to his kidneys.

## 2014-02-12 NOTE — Progress Notes (Signed)
   Subjective:    Patient ID: Jeremy Waters, male    DOB: December 10, 1945, 68 y.o.   MRN: [id number]  HPI The patient is a 68 YO man who is coming in for an acute visit for diarrhea. He was recently in [place] and drank the water there which did not agree with his stomach. He started having diarrhea and chills last Wednesday. He took imodium which did not stop the diarrhea and he was having some cramps. He kept himself hydrated with the water which he thought was making things worse. He switched to drinking sprite Saturday and started taking some pepto bismol which seemed to stop the diarrhea. Now he has not had any bowel movement since yesterday and is feeling a little bloated. He is having some mild discomfort low in his stomach at this time. The fevers and chills are resolved. He has not had more diarrhea since returning home. He denies nausea or vomiting.   Review of Systems  Constitutional: Negative for fever, chills and activity change.  Respiratory: Negative for chest tightness.   Cardiovascular: Negative for chest pain.  Gastrointestinal: Positive for abdominal pain, diarrhea, constipation and abdominal distention. Negative for nausea, vomiting, blood in stool and anal bleeding.  Neurological: Negative for dizziness, weakness and light-headedness.      Objective:   Physical Exam  Constitutional: He appears well-developed and well-nourished. No distress.  Cardiovascular: Normal rate and regular rhythm.   Pulmonary/Chest: Effort normal and breath sounds normal.  Abdominal: Soft. Bowel sounds are normal. He exhibits distension.  Mild tenderness low midline. Some mild distention without guarding or rebound. Mildly reduced sounds but bowel sounds present.       Assessment & Plan:

## 2014-02-22 ENCOUNTER — Ambulatory Visit (INDEPENDENT_AMBULATORY_CARE_PROVIDER_SITE_OTHER): Payer: Medicare Other | Admitting: Internal Medicine

## 2014-02-22 ENCOUNTER — Encounter: Payer: Self-pay | Admitting: Internal Medicine

## 2014-02-22 VITALS — BP 126/86 | HR 55 | Temp 97.8°F | Resp 14 | Wt 189.5 lb

## 2014-02-22 DIAGNOSIS — M5416 Radiculopathy, lumbar region: Secondary | ICD-10-CM

## 2014-02-22 DIAGNOSIS — K5900 Constipation, unspecified: Secondary | ICD-10-CM

## 2014-02-22 DIAGNOSIS — R350 Frequency of micturition: Secondary | ICD-10-CM

## 2014-02-22 LAB — POCT URINALYSIS DIPSTICK
Bilirubin, UA: NEGATIVE
Blood, UA: NEGATIVE
Glucose, UA: NEGATIVE
Ketones, UA: NEGATIVE
Leukocytes, UA: NEGATIVE
Nitrite, UA: NEGATIVE
Protein, UA: NEGATIVE
Spec Grav, UA: 1.01
Urobilinogen, UA: 0.2
pH, UA: 6.5

## 2014-02-22 MED ORDER — GABAPENTIN 100 MG PO CAPS: ORAL_CAPSULE | ORAL | Status: DC

## 2014-02-22 NOTE — Progress Notes (Signed)
Pre visit review using our clinic review tool, if applicable. No additional management support is needed unless otherwise documented below in the visit note. 

## 2014-02-22 NOTE — Progress Notes (Signed)
   Subjective:    Patient ID: Jeremy Waters, male    DOB: 12-21-1945, 68 y.o.   MRN: [id number]  HPI  As of 02/20/14 he's had discomfort in the right inguinal area described as sharp. It radiates to the lumbosacral spine area and occasionally into the right anterior thigh. It is affected by position change. It is increased by walking.  He does have a past history of laminectomy at L4-5  He's noted some associated urinary pressure as if he has to void.  He also has had constipation  While in [place] he developed gastroenteritis with diarrhea. This responded to Pepto-Bismol all. He also took Imodium. Those symptoms have resolved totally.  He continues to have some burning in the left inguinal area and has a general surgery appointment October 9.     Review of Systems   He is not having fever, chills, sweats, weight loss.  He has no dysuria, pyuria, or hematuria.  There is no weakness, numbness, or tingling in the right lower extremity. He he has no incontinence of urine or stool.       Objective:   Physical Exam    Positive pertinent findings include: He is able to heel and toe walk. Gait is slightly broad but not ataxic. He is able to lie flat and sit up without help With straight leg raising there is discomfort in the right hamstring. When his leg is lowered he has discomfort in the left anterior thigh. He has minor osteoarthritic changes of the DIP joints. Varices and hydrocele noted left scrotum. I cannot appreciate a hernia on either side. He has a Grade 1 systolic murmur.  General appearance :adequately nourished; in no distress. Eyes: No conjunctival inflammation or scleral icterus is present. Heart:  Normal rate and regular rhythm. S1 and S2 normal without gallop,click, rub or other extra sounds   Lungs:Chest clear to auscultation; no wheezes, rhonchi,rales ,or rubs present.No increased work of breathing.  Abdomen: bowel sounds normal, soft and non-tender without  masses, organomegaly or hernias noted.  No guarding or rebound. No flank tenderness to percussion. Vascular : all pulses equal ; no bruits present. Skin:Warm & dry.  Intact without suspicious lesions or rashes ; no jaundice or tenting Lymphatic: No lymphadenopathy is noted about the head, neck, axilla, or inguinal areas.             Assessment & Plan:   #1 right L1-L2 right lumbar radiculopathy  #2 burning in  left inguinal area ; subclinical hernia versus lumbar radiculopathy  #3 frequency with normal urine  #4 constipation  See orders recommendations

## 2014-02-22 NOTE — Patient Instructions (Signed)
Assess response to the gabapentin one every 8 hours as needed. If it is partially beneficial, it can be increased up to a total of 3 pills every 8 hours as needed. This increase of 1 pill each dose  should take place over 72 hours at least. 

## 2014-02-28 ENCOUNTER — Encounter: Payer: Self-pay | Admitting: Internal Medicine

## 2014-03-01 NOTE — Telephone Encounter (Signed)
Called patient to confirm whether he received the flu shot or not during his last office visit.  He states that when he was in the lab, they drew his blood.  He thought they gave him a flu shot when he was in the lab after getting his blood drawn.  Patient informed that the lab personnel do not administer flu vaccines.  Patient states he may have dreamed it.  Attempted to schedule the patient for a flu shot.  States he would check his calendar and call back to the office.

## 2014-03-08 ENCOUNTER — Ambulatory Visit (INDEPENDENT_AMBULATORY_CARE_PROVIDER_SITE_OTHER): Payer: Medicare Other | Admitting: Geriatric Medicine

## 2014-03-08 ENCOUNTER — Other Ambulatory Visit: Payer: Self-pay

## 2014-03-08 DIAGNOSIS — Z23 Encounter for immunization: Secondary | ICD-10-CM

## 2014-03-14 ENCOUNTER — Other Ambulatory Visit: Payer: Self-pay | Admitting: Internal Medicine

## 2014-03-22 ENCOUNTER — Encounter: Payer: Self-pay | Admitting: Internal Medicine

## 2014-03-22 ENCOUNTER — Other Ambulatory Visit: Payer: Self-pay | Admitting: Internal Medicine

## 2014-03-22 DIAGNOSIS — R1031 Right lower quadrant pain: Secondary | ICD-10-CM

## 2014-03-22 DIAGNOSIS — Z8546 Personal history of malignant neoplasm of prostate: Secondary | ICD-10-CM

## 2014-03-29 ENCOUNTER — Telehealth: Payer: Self-pay | Admitting: Internal Medicine

## 2014-03-29 NOTE — Telephone Encounter (Signed)
Dr. Linna Darner told patient to call today to check on CT scheduling.

## 2014-04-01 ENCOUNTER — Other Ambulatory Visit (INDEPENDENT_AMBULATORY_CARE_PROVIDER_SITE_OTHER): Payer: Medicare Other

## 2014-04-01 ENCOUNTER — Telehealth: Payer: Self-pay

## 2014-04-01 DIAGNOSIS — Z01818 Encounter for other preprocedural examination: Secondary | ICD-10-CM

## 2014-04-01 LAB — BASIC METABOLIC PANEL
BUN: 17 mg/dL (ref 6–23)
CO2: 27 mEq/L (ref 19–32)
Calcium: 10.1 mg/dL (ref 8.4–10.5)
Chloride: 103 mEq/L (ref 96–112)
Creatinine, Ser: 1 mg/dL (ref 0.4–1.5)
GFR: 83.01 mL/min (ref >60.00)
Glucose, Bld: 119 mg/dL — ABNORMAL HIGH (ref 70–99)
Potassium: 3.9 mEq/L (ref 3.5–5.1)
Sodium: 138 mEq/L (ref 135–145)

## 2014-04-01 NOTE — Telephone Encounter (Signed)
-----   Message from Dorian Pod sent at 04/01/2014 10:43 AM EST ----- Regarding: labs for CT scan Can you enter CT labs for this patient? He is having scan on 04/04/14. Needs BUN and creatinine. Thank you!

## 2014-04-01 NOTE — Telephone Encounter (Addendum)
Pt is scheduled for 04/04/14. I have left a msg on his mobile #. He needs to come pick up oral contrast (placed up front) and get labs done prior to his CT scan. Staff msg sent to [facility] to order BUN & creatinine.

## 2014-04-01 NOTE — Telephone Encounter (Signed)
Lab order placed.

## 2014-04-02 ENCOUNTER — Other Ambulatory Visit: Payer: Self-pay | Admitting: Internal Medicine

## 2014-04-02 DIAGNOSIS — R739 Hyperglycemia, unspecified: Secondary | ICD-10-CM | POA: Insufficient documentation

## 2014-04-03 ENCOUNTER — Other Ambulatory Visit: Payer: Self-pay | Admitting: Internal Medicine

## 2014-04-03 NOTE — Telephone Encounter (Signed)
Pt is aware of appt and instructions.

## 2014-04-04 ENCOUNTER — Ambulatory Visit (INDEPENDENT_AMBULATORY_CARE_PROVIDER_SITE_OTHER)
Admission: RE | Admit: 2014-04-04 | Discharge: 2014-04-04 | Disposition: A | Discharge status: Home / Self Care | Payer: Medicare Other | Source: Ambulatory Visit | Attending: Internal Medicine | Admitting: Internal Medicine

## 2014-04-04 DIAGNOSIS — R1031 Right lower quadrant pain: Secondary | ICD-10-CM

## 2014-04-04 DIAGNOSIS — R1030 Lower abdominal pain, unspecified: Secondary | ICD-10-CM

## 2014-04-04 DIAGNOSIS — Z8546 Personal history of malignant neoplasm of prostate: Secondary | ICD-10-CM

## 2014-04-04 MED ORDER — IOHEXOL 300 MG/ML  SOLN
100.0000 mL | Freq: Once | INTRAMUSCULAR | Status: AC | PRN
  Administered 2014-04-04: 100 mL via INTRAVENOUS

## 2014-04-08 ENCOUNTER — Encounter: Payer: Self-pay | Admitting: Internal Medicine

## 2014-04-08 MED ORDER — CLONAZEPAM 0.5 MG PO TABS: ORAL_TABLET | ORAL | Status: DC

## 2014-04-10 DIAGNOSIS — H903 Sensorineural hearing loss, bilateral: Secondary | ICD-10-CM | POA: Insufficient documentation

## 2014-04-10 DIAGNOSIS — N529 Male erectile dysfunction, unspecified: Secondary | ICD-10-CM | POA: Insufficient documentation

## 2014-05-09 ENCOUNTER — Ambulatory Visit (INDEPENDENT_AMBULATORY_CARE_PROVIDER_SITE_OTHER): Payer: Medicare Other | Admitting: Internal Medicine

## 2014-05-09 ENCOUNTER — Encounter: Payer: Self-pay | Admitting: Internal Medicine

## 2014-05-09 VITALS — BP 148/92 | HR 80 | Temp 98.2°F | Wt 195.2 lb

## 2014-05-09 DIAGNOSIS — J209 Acute bronchitis, unspecified: Secondary | ICD-10-CM

## 2014-05-09 MED ORDER — HYDROCODONE-HOMATROPINE 5-1.5 MG/5ML PO SYRP: 5.0000 mL | ORAL_SOLUTION | Freq: Four times a day (QID) | ORAL | Status: DC | PRN

## 2014-05-09 MED ORDER — AZITHROMYCIN 250 MG PO TABS: ORAL_TABLET | ORAL | Status: DC

## 2014-05-09 NOTE — Progress Notes (Signed)
Pre visit review using our clinic review tool, if applicable. No additional management support is needed unless otherwise documented below in the visit note. 

## 2014-05-09 NOTE — Patient Instructions (Addendum)
Carry room temperature water and sip liberally after coughing.  Please take a probiotic , Florastor OR Align, every day if the bowels are loose. This will replace the normal bacteria which  are necessary for formation of normal stool and processing of food. 

## 2014-05-09 NOTE — Progress Notes (Signed)
   Subjective:    Patient ID: Jeremy Waters, male    DOB: [birth year], 68 y.o.   MRN: [id number]  HPI He has had a cough since 05/06/14. It's associated with short paroxysms which have aggravated the pain he has in his left groin. Symptoms are worse lying down. Robitussin-DM as been of some benefit. He does describes some chills and sweats. Additionally he describes sneezing. Dyspepsia is minor.  He smoked 2 packs per day for 20 years; he has not smoked at all for at least 2 years.   Review of Systems Frontal headache, facial pain , nasal purulence, dental pain,  otic pain or otic discharge denied. No fever .  Some loose to watery stool.     Objective:   Physical Exam  General appearance:good health ;well nourished; no acute distress or increased work of breathing is present.  No  lymphadenopathy about the head, neck, or axilla noted.   Eyes: No conjunctival inflammation or lid edema is present. There is no scleral icterus.  Ears:  External ear exam shows no significant lesions or deformities.  Otoscopic examination reveals clear canals, tympanic membranes are intact bilaterally without bulging, retraction, inflammation or discharge.Aids bilaterally  Nose:  External nasal examination shows no deformity or inflammation. Nasal mucosa are pink and moist without lesions or exudates. No septal dislocation or deviation.No obstruction to airflow.   Oral exam: Dental hygiene is good; lips and gums are healthy appearing.There is no oropharyngeal erythema or exudate noted.   Neck:  No deformities, thyromegaly, masses, or tenderness noted.   Supple with full range of motion without pain.   Heart:  Normal rate and regular rhythm. S1 and S2 normal without gallop, murmur, click, rub or other extra sounds.   Lungs:Minimal  Rhonchi  Present on L posteriorly.No increased work of breathing.    Extremities:  No cyanosis, edema, or clubbing  noted    Skin: Warm & dry w/o jaundice or  tenting.        Assessment & Plan:  #1 acute bronchitis w/o bronchospasm  Plan: See orders and recommendations

## 2014-05-13 ENCOUNTER — Other Ambulatory Visit: Payer: Self-pay | Admitting: Internal Medicine

## 2014-05-13 ENCOUNTER — Encounter: Payer: Self-pay | Admitting: Internal Medicine

## 2014-05-13 DIAGNOSIS — J209 Acute bronchitis, unspecified: Secondary | ICD-10-CM

## 2014-05-13 MED ORDER — AZITHROMYCIN 250 MG PO TABS: ORAL_TABLET | ORAL | Status: DC

## 2014-05-14 ENCOUNTER — Encounter: Payer: Self-pay | Admitting: Internal Medicine

## 2014-06-10 ENCOUNTER — Encounter: Payer: Self-pay | Admitting: Internal Medicine

## 2014-06-11 MED ORDER — CLONAZEPAM 0.5 MG PO TABS: ORAL_TABLET | ORAL | Status: DC

## 2014-07-02 ENCOUNTER — Ambulatory Visit (INDEPENDENT_AMBULATORY_CARE_PROVIDER_SITE_OTHER): Payer: Medicare Other | Admitting: Internal Medicine

## 2014-07-02 ENCOUNTER — Encounter: Payer: Self-pay | Admitting: Internal Medicine

## 2014-07-02 VITALS — BP 146/86 | HR 54 | Temp 97.5°F | Ht 68.0 in | Wt 199.0 lb

## 2014-07-02 DIAGNOSIS — J209 Acute bronchitis, unspecified: Secondary | ICD-10-CM

## 2014-07-02 MED ORDER — AMOXICILLIN-POT CLAVULANATE 875-125 MG PO TABS: 1.0000 | ORAL_TABLET | Freq: Two times a day (BID) | ORAL | Status: DC

## 2014-07-02 MED ORDER — ALBUTEROL SULFATE 108 (90 BASE) MCG/ACT IN AEPB: 2.0000 | INHALATION_SPRAY | Freq: Four times a day (QID) | RESPIRATORY_TRACT | Status: DC | PRN

## 2014-07-02 NOTE — Patient Instructions (Addendum)
Albuterol is a rescue inhaler whichcan be used  1-2 puffs every 4-6  hours. It may  be used 15-30 minutes before exercise if that is also  a trigger for your chest tightness.

## 2014-07-02 NOTE — Progress Notes (Signed)
   Subjective:    Patient ID: Jeremy Waters, male    DOB: Nov 21, 1945, 69 y.o.   MRN: [id number]  HPI  He has had some bronchitic symptoms since before Christmas 2015. He took 2 courses of Z-Pak with some improvement.  He now has persistent cough with occasional sputum which is usually clear but sometimes yellow. He has noted associated sneezing with some anosmia and a feeling of "tightness in the chest".  Walking on the beach has exacerbated the cough.  He rarely smokes a cigar.  He has no upper respiratory tract symptoms.  Review of Systems Frontal headache, facial pain , nasal purulence, dental pain, sore throat , otic pain or otic discharge denied. No fever , chills or sweats.     Objective:   Physical Exam  Positive or pertinent findings include: Hearing aids present bilaterally He does have mild erythema of the nasal mucosa. He has a grade 1/2 systolic murmur at the left base  General appearance:Adequately nourished; no acute distress or increased work of breathing is present.  No  lymphadenopathy about the head, neck, or axilla noted.  Eyes: No conjunctival inflammation or lid edema is present. There is no scleral icterus. Ears:  External ear exam shows no significant lesions or deformities.  Otoscopic examination reveals clear canals, tympanic membranes are intact bilaterally without bulging, retraction, inflammation or discharge. Nose:  External nasal examination shows no deformity or inflammation. No septal dislocation or deviation.No obstruction to airflow.  Oral exam: Dental hygiene is good; lips and gums are healthy appearing.There is no oropharyngeal erythema or exudate noted.  Neck:  No deformities, thyromegaly, masses, or tenderness noted.   Supple with full range of motion without pain.  Heart:  Normal rate and regular rhythm. S1 and S2 normal without gallop,  click, rub or other extra sounds.  Lungs:Chest clear to auscultation; no wheezes, rhonchi,rales ,or rubs  present. Extremities:  No cyanosis, edema, or clubbing  noted  Skin: Warm & dry w/o jaundice or tenting.        Assessment & Plan:  #1 acute bronchitis with possible intermittent bronchospasm  Plan: See orders and recommendations

## 2014-07-02 NOTE — Progress Notes (Signed)
Pre visit review using our clinic review tool, if applicable. No additional management support is needed unless otherwise documented below in the visit note. 

## 2014-07-23 ENCOUNTER — Other Ambulatory Visit: Payer: Self-pay | Admitting: Internal Medicine

## 2014-07-24 NOTE — Telephone Encounter (Signed)
OK X 1 year

## 2014-07-24 NOTE — Telephone Encounter (Signed)
I do not see on current med list

## 2014-08-09 ENCOUNTER — Other Ambulatory Visit (HOSPITAL_COMMUNITY): Payer: Self-pay | Admitting: *Deleted

## 2014-08-09 DIAGNOSIS — I6523 Occlusion and stenosis of bilateral carotid arteries: Secondary | ICD-10-CM

## 2014-08-11 ENCOUNTER — Encounter: Payer: Self-pay | Admitting: Internal Medicine

## 2014-08-12 MED ORDER — CLONAZEPAM 0.5 MG PO TABS: ORAL_TABLET | ORAL | Status: DC

## 2014-08-12 NOTE — Telephone Encounter (Signed)
Clonazepam has been called to [facility] on [address]

## 2014-08-27 ENCOUNTER — Encounter: Payer: Self-pay | Admitting: Internal Medicine

## 2014-08-27 ENCOUNTER — Ambulatory Visit (HOSPITAL_COMMUNITY): Payer: Medicare Other | Attending: Internal Medicine | Admitting: *Deleted

## 2014-08-27 DIAGNOSIS — I6523 Occlusion and stenosis of bilateral carotid arteries: Secondary | ICD-10-CM | POA: Diagnosis present

## 2014-08-27 NOTE — Progress Notes (Signed)
Carotid Duplex Scan Performed 

## 2014-08-30 ENCOUNTER — Encounter: Payer: Self-pay | Admitting: Internal Medicine

## 2014-08-31 ENCOUNTER — Other Ambulatory Visit: Payer: Self-pay | Admitting: Internal Medicine

## 2014-08-31 DIAGNOSIS — R739 Hyperglycemia, unspecified: Secondary | ICD-10-CM

## 2014-08-31 DIAGNOSIS — E785 Hyperlipidemia, unspecified: Secondary | ICD-10-CM

## 2014-09-02 ENCOUNTER — Other Ambulatory Visit (INDEPENDENT_AMBULATORY_CARE_PROVIDER_SITE_OTHER): Payer: Medicare Other

## 2014-09-02 ENCOUNTER — Other Ambulatory Visit: Payer: Self-pay | Admitting: Internal Medicine

## 2014-09-02 DIAGNOSIS — E785 Hyperlipidemia, unspecified: Secondary | ICD-10-CM

## 2014-09-02 DIAGNOSIS — R739 Hyperglycemia, unspecified: Secondary | ICD-10-CM

## 2014-09-02 LAB — HEPATIC FUNCTION PANEL
ALT: 31 U/L (ref 0–53)
AST: 33 U/L (ref 0–37)
Albumin: 4.3 g/dL (ref 3.5–5.2)
Alkaline Phosphatase: 60 U/L (ref 39–117)
Bilirubin, Direct: 0.1 mg/dL (ref 0.0–0.3)
Total Bilirubin: 0.4 mg/dL (ref 0.2–1.2)
Total Protein: 7.7 g/dL (ref 6.0–8.3)

## 2014-09-02 LAB — HEMOGLOBIN A1C: Hgb A1c MFr Bld: 5.4 % (ref 4.6–6.5)

## 2014-09-02 LAB — TSH: TSH: 2.86 u[IU]/mL (ref 0.35–4.50)

## 2014-09-04 LAB — NMR LIPOPROFILE WITH LIPIDS
Cholesterol, Total: 224 mg/dL — ABNORMAL HIGH (ref 100–199)
HDL Particle Number: 46.1 umol/L (ref >=30.5)
HDL Size: 9.4 nm (ref >=9.2)
HDL-C: 76 mg/dL (ref >39)
LDL (calc): 125 mg/dL — ABNORMAL HIGH (ref 0–99)
LDL Particle Number: 1569 nmol/L — ABNORMAL HIGH (ref <1000)
LDL Size: 21.1 nm (ref >=20.8)
LP-IR Score: 50 — ABNORMAL HIGH (ref <=45)
Large HDL-P: 11.4 umol/L (ref >=4.8)
Large VLDL-P: 5.1 nmol/L — ABNORMAL HIGH (ref <=2.7)
Small LDL Particle Number: 619 nmol/L — ABNORMAL HIGH (ref <=527)
Triglycerides: 116 mg/dL (ref 0–149)
VLDL Size: 56.5 nm — ABNORMAL HIGH (ref <=46.6)

## 2014-09-10 ENCOUNTER — Encounter: Payer: Self-pay | Admitting: Internal Medicine

## 2014-09-10 ENCOUNTER — Ambulatory Visit (INDEPENDENT_AMBULATORY_CARE_PROVIDER_SITE_OTHER): Payer: Medicare Other | Admitting: Internal Medicine

## 2014-09-10 VITALS — BP 128/72 | HR 59 | Temp 98.0°F | Resp 16 | Ht 68.0 in | Wt 184.0 lb

## 2014-09-10 DIAGNOSIS — M79602 Pain in left arm: Secondary | ICD-10-CM | POA: Diagnosis not present

## 2014-09-10 DIAGNOSIS — I6523 Occlusion and stenosis of bilateral carotid arteries: Secondary | ICD-10-CM | POA: Diagnosis not present

## 2014-09-10 DIAGNOSIS — E785 Hyperlipidemia, unspecified: Secondary | ICD-10-CM | POA: Diagnosis not present

## 2014-09-10 MED ORDER — SIMVASTATIN 20 MG PO TABS: 20.0000 mg | ORAL_TABLET | Freq: Every day | ORAL | Status: DC

## 2014-09-10 NOTE — Patient Instructions (Signed)
The StressTest referral will be scheduled and you'll be notified of the time.Please call the Referral Co-Ordinator @ [phone] if you have not been notified of appointment time within 7-10 days.  Fasting labs after 10 weeks of the new statin, simvastatin 20 mg daily in place of pravastatin 20 mg

## 2014-09-10 NOTE — Progress Notes (Signed)
   Subjective:    Patient ID: Jeremy Waters, male    DOB: 1945-07-01, 69 y.o.   MRN: [id number]  HPI The patient is here to assess status of active health conditions and address labs.  PMH, FH, & Social History reviewed & updated.  On his NMR Lipoprofile panel his LDL was 125 with a total particle number of 1569 and small dense particle # of 619. HDL was protective at 76. LP IR was 50, mildly increased. His LDL goal would be less than 105, ideally less than 75.  He does avoid carbs in the form of bread. He is following a Mediterranean diet.He eats red meat approximately once a week. He does drink 3-4 glasses of wine a night.  He exercises daily 30-40 minutes. On the elliptical he will have intermittent left triceps pain. If he rests the weakness and discomfort resolves but will occur if he continues. He also had similar discomfort in this area as an aching after walking his dog 1.5 miles.   He denies any other cardiovascular symptoms. There is no family history of premature heart attack or stroke.  His last EKG was 06/15/13 and revealed incomplete right bundle branch block.  Carotid Dopplers 08/28/14 reveals some progression of the left carotid atherosclerotic plaque.  Review of Systems   Chest pain, palpitations, tachycardia, exertional dyspnea, paroxysmal nocturnal dyspnea, claudication or edema are absent.       Objective:   Physical Exam Pertinent or positive findings include: He has a rumbling grade 1 systolic murmur at the base. He has a very brisk right carotid bruit. There is a rumbling bruit on the left.  General appearance :adequately nourished; in no distress. Pattern alopecia. Eyes: No conjunctival inflammation or scleral icterus is present. Oral exam:  Lips and gums are healthy appearing.There is no oropharyngeal erythema or exudate noted. Dental hygiene is good. Heart:  Normal rate and regular rhythm. S1 and S2 normal without gallop,  click, rub or other extra sounds     Lungs:Chest clear to auscultation; no wheezes, rhonchi,rales ,or rubs present.No increased work of breathing.  Abdomen: bowel sounds normal, soft and non-tender without masses, organomegaly or hernias noted.  No guarding or rebound.  Vascular : all pulses equal and full Skin:Warm & dry.  Intact without suspicious lesions or rashes ; no tenting or jaundice  Lymphatic: No lymphadenopathy is noted about the head, neck, axilla Neuro: Strength, tone & DTRs normal.       Assessment & Plan:  #1 left triceps discomfort with exercise  #2 dyslipidemia, not @ goal  #3 progressive carotid atherosclerosis  Plan: He'll be switched to simvastatin from pravastatin.  EKG  Stress test

## 2014-09-10 NOTE — Progress Notes (Signed)
Pre visit review using our clinic review tool, if applicable. No additional management support is needed unless otherwise documented below in the visit note. 

## 2014-09-13 ENCOUNTER — Telehealth: Payer: Self-pay

## 2014-09-13 NOTE — Telephone Encounter (Signed)
Patient called to educate on Medicare Wellness apt. LVM for the patient to call back to educate and schedule for wellness visit.   

## 2014-09-18 NOTE — Telephone Encounter (Signed)
Call placed to the patient and informed Jeremy Waters that Dr. Linna Darner did want him to take the prevnar and the patient was driving but stated he would definitely follow up with nurse visit.

## 2014-09-21 ENCOUNTER — Other Ambulatory Visit: Payer: Self-pay | Admitting: Internal Medicine

## 2014-10-08 ENCOUNTER — Encounter: Payer: Self-pay | Admitting: Internal Medicine

## 2014-10-09 ENCOUNTER — Other Ambulatory Visit: Payer: Self-pay

## 2014-10-09 ENCOUNTER — Other Ambulatory Visit: Payer: Self-pay | Admitting: Internal Medicine

## 2014-10-09 MED ORDER — CLONAZEPAM 0.5 MG PO TABS: ORAL_TABLET | ORAL | Status: DC

## 2014-11-06 ENCOUNTER — Encounter: Payer: Medicare Other | Admitting: Physician Assistant

## 2014-11-13 ENCOUNTER — Encounter (INDEPENDENT_AMBULATORY_CARE_PROVIDER_SITE_OTHER): Payer: Medicare Other | Admitting: Physician Assistant

## 2014-11-13 DIAGNOSIS — I6523 Occlusion and stenosis of bilateral carotid arteries: Secondary | ICD-10-CM | POA: Diagnosis not present

## 2014-11-13 DIAGNOSIS — M79602 Pain in left arm: Secondary | ICD-10-CM | POA: Diagnosis not present

## 2014-11-13 DIAGNOSIS — E785 Hyperlipidemia, unspecified: Secondary | ICD-10-CM

## 2014-11-13 LAB — EXERCISE TOLERANCE TEST
Estimated workload: 11.8 METS
Exercise duration (min): 10 min
MPHR: 153 {beats}/min
Peak HR: 141 {beats}/min
Percent HR: 92 %
RPE: 17
Rest HR: 56 {beats}/min

## 2014-11-18 ENCOUNTER — Other Ambulatory Visit: Payer: Self-pay

## 2014-11-20 ENCOUNTER — Ambulatory Visit (INDEPENDENT_AMBULATORY_CARE_PROVIDER_SITE_OTHER): Payer: Medicare Other

## 2014-11-20 ENCOUNTER — Other Ambulatory Visit (INDEPENDENT_AMBULATORY_CARE_PROVIDER_SITE_OTHER): Payer: Medicare Other

## 2014-11-20 DIAGNOSIS — Z23 Encounter for immunization: Secondary | ICD-10-CM

## 2014-11-20 DIAGNOSIS — I6523 Occlusion and stenosis of bilateral carotid arteries: Secondary | ICD-10-CM | POA: Diagnosis not present

## 2014-11-20 DIAGNOSIS — E785 Hyperlipidemia, unspecified: Secondary | ICD-10-CM

## 2014-11-20 LAB — LIPID PANEL
Cholesterol: 197 mg/dL (ref 0–200)
HDL: 66.2 mg/dL (ref >39.00)
LDL Cholesterol: 105 mg/dL — ABNORMAL HIGH (ref 0–99)
NonHDL: 130.8
Total CHOL/HDL Ratio: 3
Triglycerides: 131 mg/dL (ref 0.0–149.0)
VLDL: 26.2 mg/dL (ref 0.0–40.0)

## 2014-11-20 LAB — HEPATIC FUNCTION PANEL
ALT: 25 U/L (ref 0–53)
AST: 30 U/L (ref 0–37)
Albumin: 4.3 g/dL (ref 3.5–5.2)
Alkaline Phosphatase: 63 U/L (ref 39–117)
Bilirubin, Direct: 0.1 mg/dL (ref 0.0–0.3)
Total Bilirubin: 0.6 mg/dL (ref 0.2–1.2)
Total Protein: 7.5 g/dL (ref 6.0–8.3)

## 2014-11-20 MED ORDER — CYANOCOBALAMIN 1000 MCG/ML IJ SOLN: 1000.0000 ug | Freq: Once | INTRAMUSCULAR | Status: DC

## 2014-11-21 ENCOUNTER — Encounter: Payer: Self-pay | Admitting: Internal Medicine

## 2014-12-04 ENCOUNTER — Other Ambulatory Visit: Payer: Self-pay | Admitting: Internal Medicine

## 2015-02-03 ENCOUNTER — Other Ambulatory Visit: Payer: Self-pay | Admitting: Emergency Medicine

## 2015-02-03 MED ORDER — SIMVASTATIN 20 MG PO TABS: ORAL_TABLET | ORAL | Status: DC

## 2015-02-10 ENCOUNTER — Encounter: Payer: Self-pay | Admitting: Internal Medicine

## 2015-02-11 ENCOUNTER — Telehealth: Payer: Self-pay | Admitting: *Deleted

## 2015-02-11 MED ORDER — CLONAZEPAM 0.5 MG PO TABS: ORAL_TABLET | ORAL | Status: DC

## 2015-02-11 NOTE — Telephone Encounter (Signed)
Pt left msg on triage inquiring about refill for clonazepam.../lmb

## 2015-02-11 NOTE — Telephone Encounter (Signed)
Printed script md sign sent msg via mychart [facility].../lmb

## 2015-02-11 NOTE — Telephone Encounter (Signed)
Forwarded to [facility];fill it if you can

## 2015-02-11 NOTE — Telephone Encounter (Signed)
Left msg on triage stating sent md mychart msg needing to get refill on clonazepam.../lmb

## 2015-02-18 ENCOUNTER — Encounter: Payer: Self-pay | Admitting: Internal Medicine

## 2015-02-27 ENCOUNTER — Ambulatory Visit (INDEPENDENT_AMBULATORY_CARE_PROVIDER_SITE_OTHER): Payer: Medicare Other

## 2015-02-27 DIAGNOSIS — Z23 Encounter for immunization: Secondary | ICD-10-CM | POA: Diagnosis not present

## 2015-03-21 ENCOUNTER — Other Ambulatory Visit: Payer: Self-pay | Admitting: Internal Medicine

## 2015-03-25 ENCOUNTER — Encounter: Payer: Self-pay | Admitting: Internal Medicine

## 2015-04-23 ENCOUNTER — Encounter: Payer: Self-pay | Admitting: Internal Medicine

## 2015-04-23 ENCOUNTER — Telehealth: Payer: Self-pay | Admitting: Internal Medicine

## 2015-04-23 NOTE — Telephone Encounter (Signed)
  I am transitioning to full retirement as of 05/23/2015. Over the next several weeks; I shall be in the clinic on an abbreviated schedule Dr Billey Gosling has joined [facility] here @ [facility] ; I enthusiastically recommend her to you because of her clinical skills and compassion. Also Terri Piedra, NP @ [facility] is an outstanding Primary Care practitioner who truly cares about his patients. If you need medication refills;a physical or update of your medical record ;this would be an excellent opportunity to schedule an appointment with Dr Quay Burow or Marya Amsler.  It has been an Surveyor, minerals and blessing to have served you as your physician. I wish you the best in the future. [employer]

## 2015-04-23 NOTE — Telephone Encounter (Signed)
Please contact patient via mychart. Patient insists that he would like your recommendation regarding whichPCP he should go with moving forward,

## 2015-04-25 ENCOUNTER — Telehealth: Payer: Self-pay | Admitting: Internal Medicine

## 2015-04-25 ENCOUNTER — Other Ambulatory Visit: Payer: Self-pay | Admitting: Internal Medicine

## 2015-04-25 DIAGNOSIS — Z1159 Encounter for screening for other viral diseases: Secondary | ICD-10-CM

## 2015-04-25 NOTE — Telephone Encounter (Signed)
Pt is requesting a Hep C screening through [facility].  He has an appt with Dr. Quay Burow to transfer in January.  He would like to have this done before  Please advise

## 2015-04-25 NOTE — Telephone Encounter (Signed)
Order entered

## 2015-04-28 ENCOUNTER — Other Ambulatory Visit: Payer: Medicare Other

## 2015-04-28 DIAGNOSIS — Z1159 Encounter for screening for other viral diseases: Secondary | ICD-10-CM

## 2015-04-29 LAB — HEPATITIS C ANTIBODY: HCV Ab: NEGATIVE

## 2015-05-08 IMAGING — CR DG CHEST 2V
2 series · 2 of 2 positions shown · non-contrast
Comparison: DG CHEST 2 VIEW dated 06/20/2012 the lungs are
well-expanded.

CLINICAL DATA: Preoperative for left knee surgery, history of
tobacco use and prostate malignancy

EXAM:
CHEST  2 VIEW

[w chest pa]
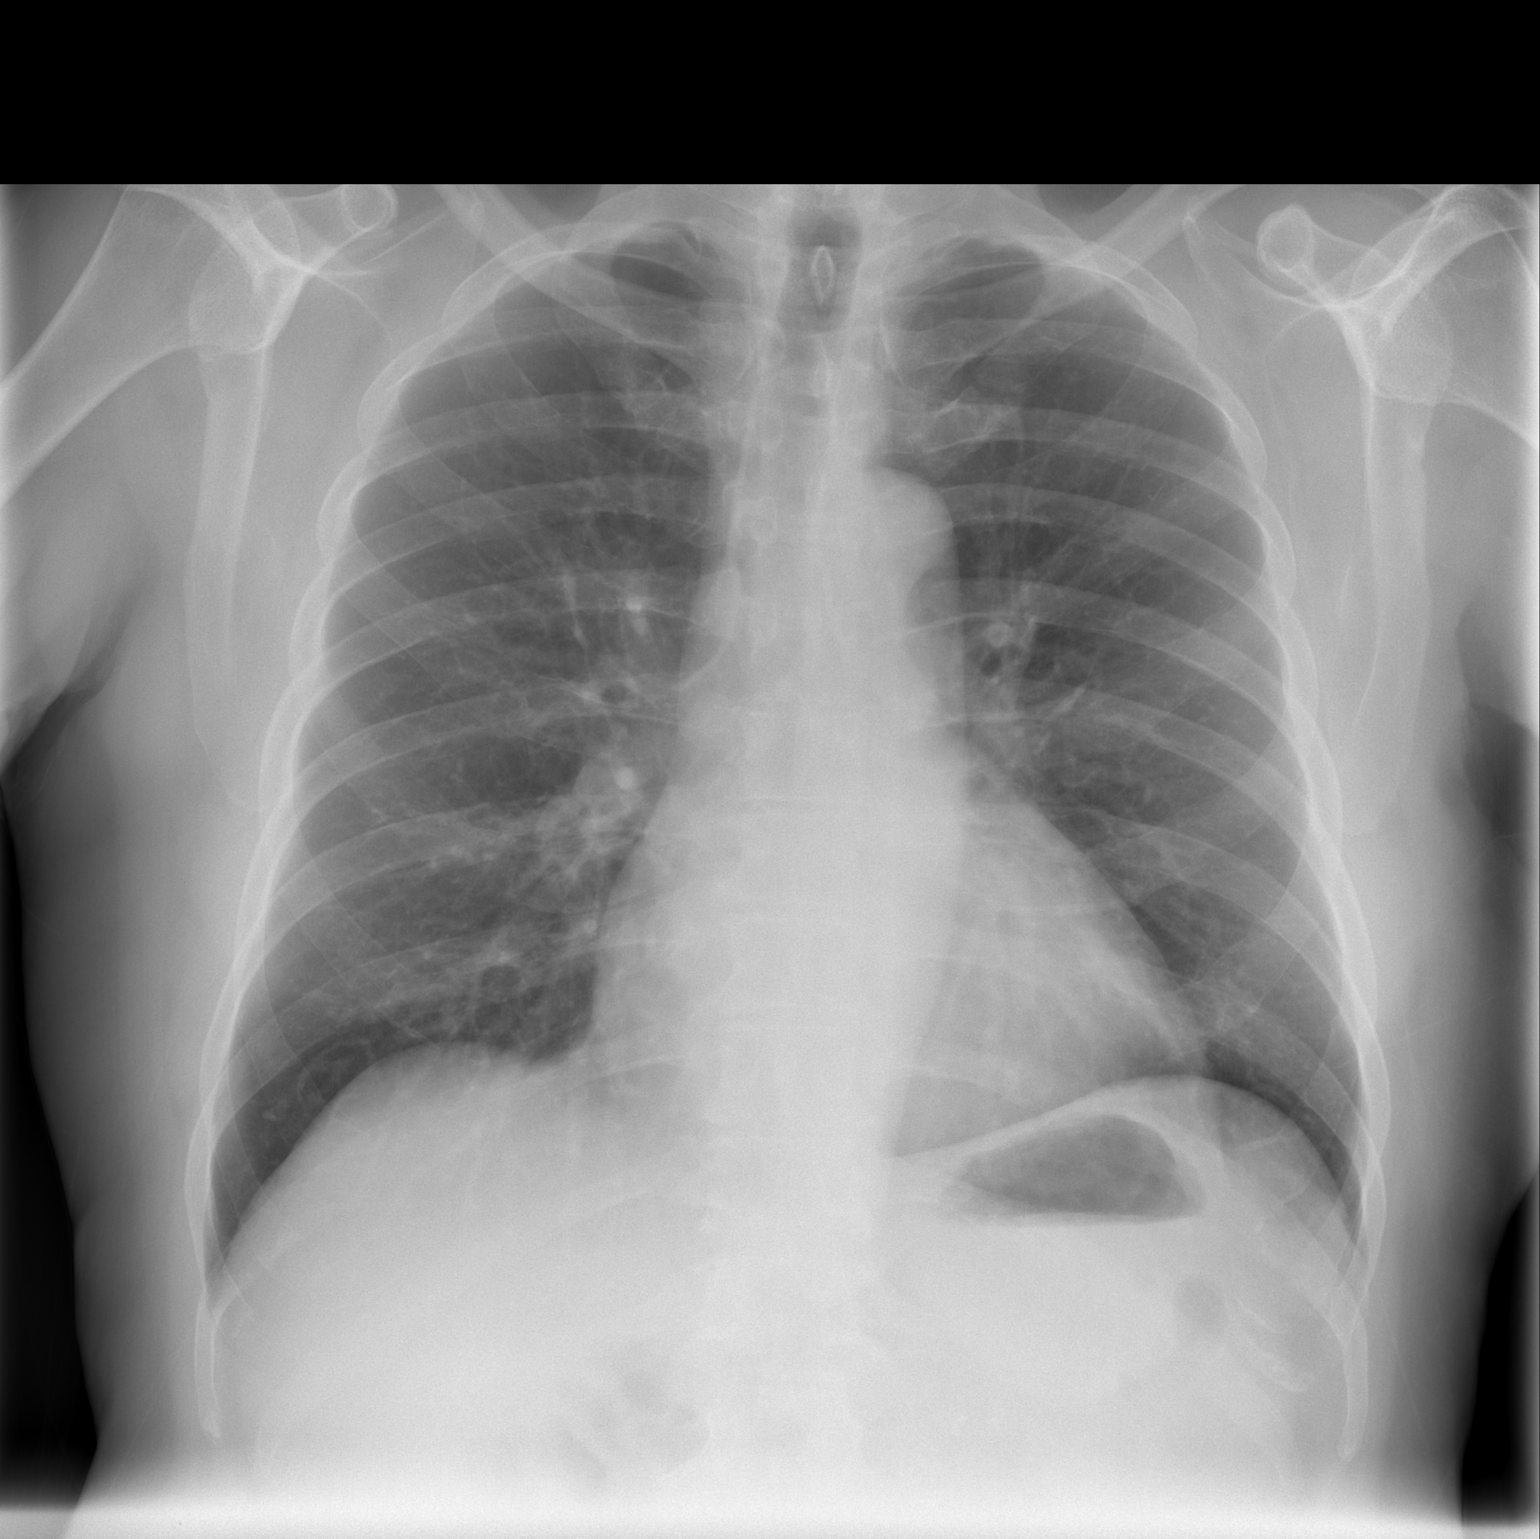

[w chest lat]
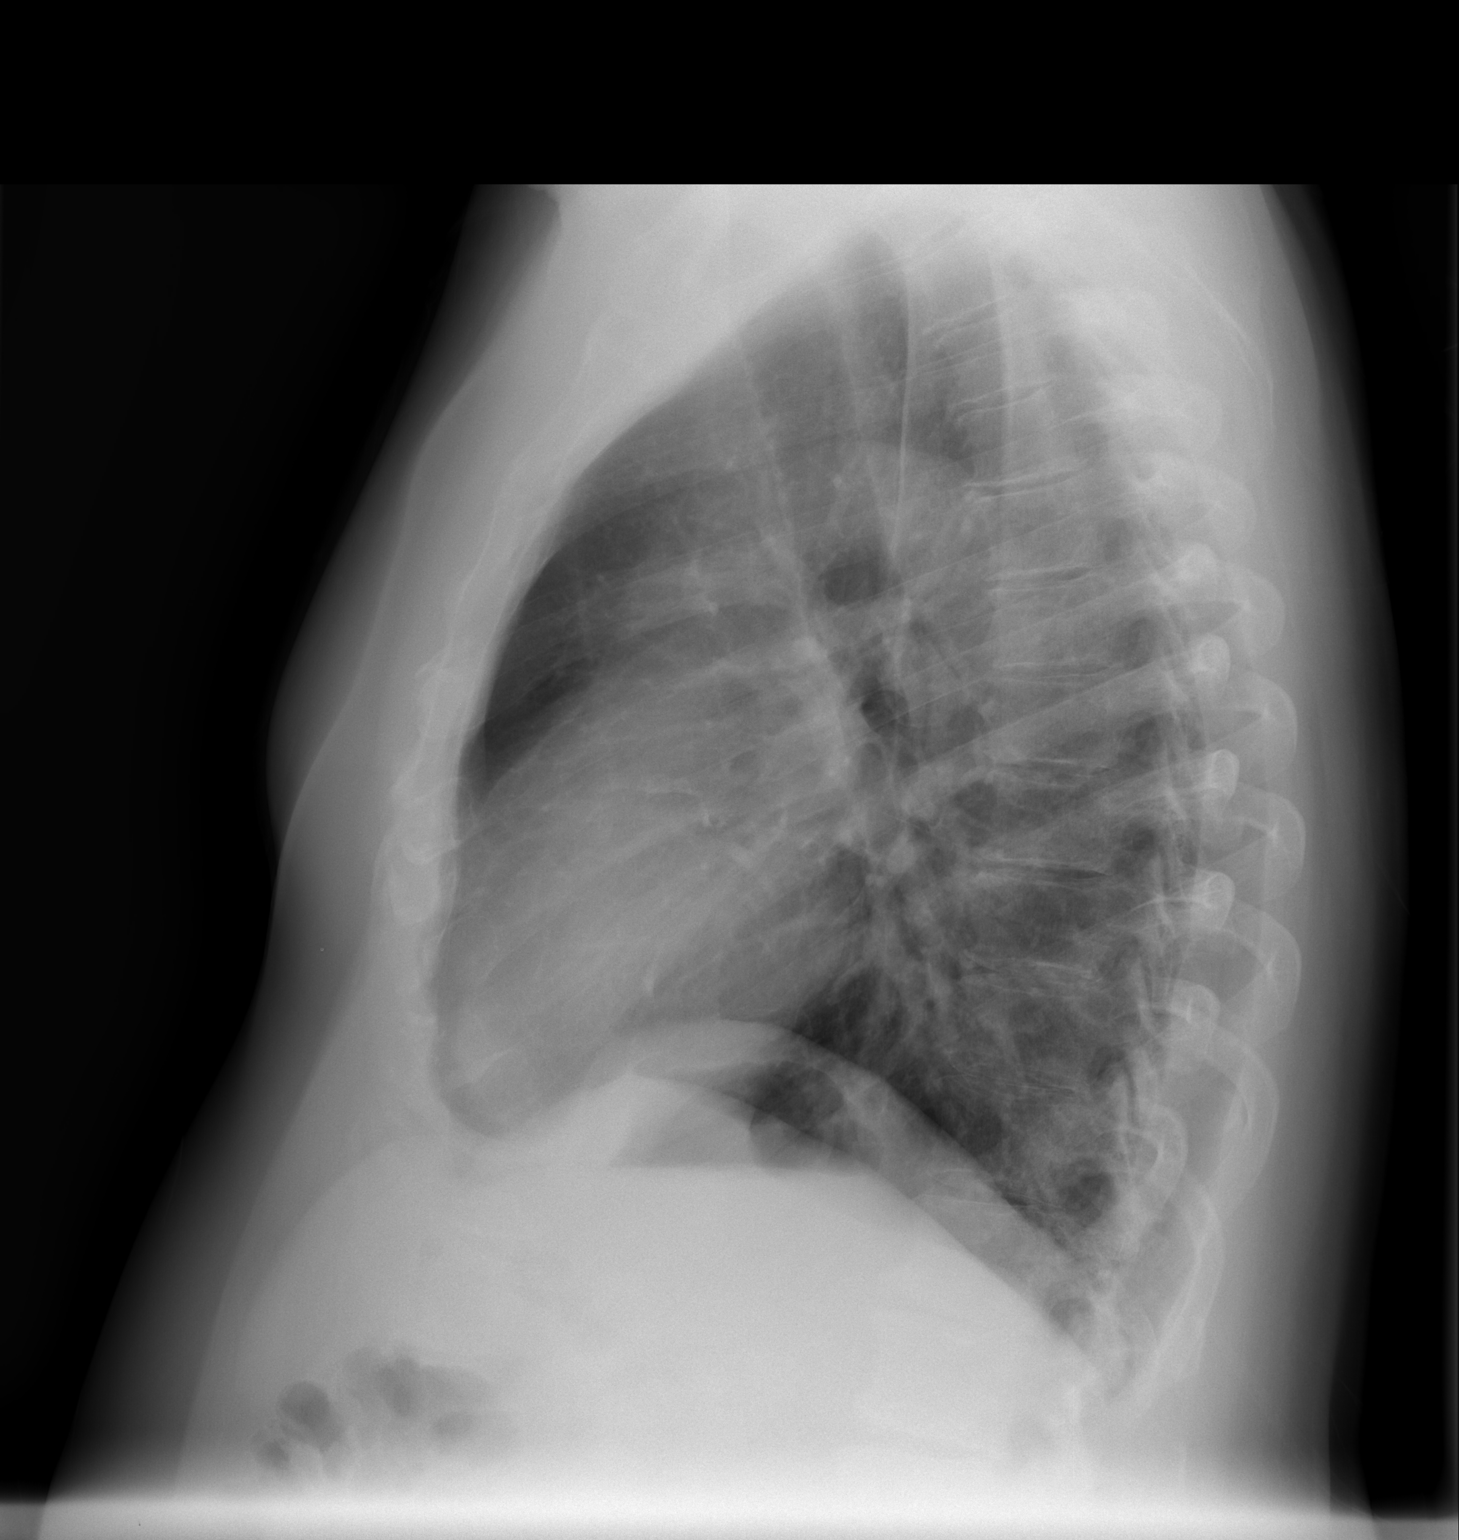

[2 of 2 positions shown; findings below may reference images not displayed]

There is no focal infiltrate. No pulmonary
parenchymal nodules or masses are demonstrated. The cardiac
silhouette is top-normal in size. The pulmonary vascularity is not
engorged. The mediastinum is normal in width. There is mild
tortuosity of the descending thoracic aorta. There is no pleural
effusion or pneumothorax. The observed portions of the bony thorax
appear normal.
FINDINGS: The lungs are well-expanded. There is no focal infiltrate. No
pulmonary parenchymal nodules or masses are demonstrated. The
cardiac silhouette is top-normal in size. The pulmonary vascularity
is not engorged. The mediastinum is normal in width. There is mild
tortuosity of the descending thoracic aorta. There is no pleural
effusion or pneumothorax. The observed portions of the bony thorax
appear normal.
IMPRESSION: No active cardiopulmonary disease.

## 2015-05-27 ENCOUNTER — Telehealth: Payer: Self-pay | Admitting: Internal Medicine

## 2015-05-27 ENCOUNTER — Encounter: Payer: Self-pay | Admitting: Internal Medicine

## 2015-05-27 ENCOUNTER — Ambulatory Visit (INDEPENDENT_AMBULATORY_CARE_PROVIDER_SITE_OTHER): Payer: Medicare Other | Admitting: Internal Medicine

## 2015-05-27 VITALS — BP 134/76 | HR 59 | Temp 98.6°F | Resp 16 | Wt 159.0 lb

## 2015-05-27 DIAGNOSIS — G479 Sleep disorder, unspecified: Secondary | ICD-10-CM | POA: Diagnosis not present

## 2015-05-27 DIAGNOSIS — E785 Hyperlipidemia, unspecified: Secondary | ICD-10-CM

## 2015-05-27 DIAGNOSIS — E039 Hypothyroidism, unspecified: Secondary | ICD-10-CM | POA: Diagnosis not present

## 2015-05-27 MED ORDER — SIMVASTATIN 20 MG PO TABS: ORAL_TABLET | ORAL | Status: DC

## 2015-05-27 MED ORDER — CLONAZEPAM 0.5 MG PO TABS: ORAL_TABLET | ORAL | Status: DC

## 2015-05-27 MED ORDER — LEVOTHYROXINE SODIUM 25 MCG PO TABS
25.0000 ug | ORAL_TABLET | Freq: Every day | ORAL | Status: DC
Start: 2015-05-27 — End: 2015-12-02

## 2015-05-27 NOTE — Progress Notes (Signed)
Subjective:    Patient ID: Jeremy Waters, male    DOB: 1946/01/22, 70 y.o.   MRN: [id number]  HPI He is here to establish with a new pcp and here for routine follow up.    Insomnia:  He takes clonazepam for sleep.  He takes 1/2 pill every night.  He has been on medication for years and denies any side effects.  Hyperlipidemia: He is taking his medication daily. He is compliant with a low fat/cholesterol diet. He is exercising regularly. He denies myalgias.   Hypothyroidism:  He takes his medication daily.  He denies any changes in his energy level.   He has intentionally lost weight.  He is not dieting, but is eating healthier.  He exercises daily.    He has no concerns or questions.   Medications and allergies reviewed with patient and updated if appropriate.  Patient Active Problem List   Diagnosis Date Noted  . Hyperglycemia 04/02/2014  . Diarrhea 02/12/2014  . S/P left TKA 07/03/2013  . Left knee DJD 06/07/2013  . Right carotid bruit 06/07/2013  . Unspecified hypothyroidism 03/03/2012  . Axillary mass, right 04/07/2011  . ROSACEA 03/19/2010  . ARTHRALGIA 03/19/2010  . MYALGIA 03/19/2010  . CERVICALGIA 09/05/2009  . CARPAL TUNNEL SYNDROME 06/17/2008  . DEGENERATIVE JOINT DISEASE, GENERALIZED 06/17/2008  . CERVICAL RADICULOPATHY, LEFT 06/17/2008  . PARESTHESIA, HANDS 06/17/2008  . SLEEP DISORDER 03/04/2008  . PROSTATE CANCER, HX OF 03/04/2008  . Hyperlipidemia 06/15/2007  . DIVERTICULOSIS, COLON W/O HEM 03/03/2007    Current Outpatient Prescriptions on File Prior to Visit  Medication Sig Dispense Refill  . Albuterol Sulfate (PROAIR RESPICLICK) [id number] (90 BASE) MCG/ACT AEPB Inhale 2 puffs into the lungs every 6 (six) hours as needed. 1 each 2  . beta carotene 30 MG capsule Take 30 mg by mouth daily.      . clonazePAM (KLONOPIN) 0.5 MG tablet TAKE ONE TABLET AT BEDTIME AS NEEDED 30 tablet 1  . fluticasone (FLONASE) 50 MCG/ACT nasal spray PLACE 1 SPRAY INTO BOTH  NOSTRILS DAILY. 16 g 11  . vitamin C (ASCORBIC ACID) 500 MG tablet Take 500 mg by mouth 2 (two) times daily.     . vitamin E 400 UNIT capsule Take 400 Units by mouth daily.       Current Facility-Administered Medications on File Prior to Visit  Medication Dose Route Frequency Provider Last Rate Last Dose  . cyanocobalamin ((VITAMIN B-12)) injection 1,000 mcg  1,000 mcg Intramuscular Once Hendricks Limes, MD   1,000 mcg at 11/20/14 [id number]    Past Medical History  Diagnosis Date  . Diverticulosis of colon     w/o hemorrage  . Hyperlipidemia   . Prostate cancer   . Colon polyp   . Inguinal hernia     left side   . Heart murmur     TOLD HE HAS A SLIGHT MURMUR  . Arthritis     OA / PAIN LEFT KNEE    Past Surgical History  Procedure Laterality Date  . Laminectomy  2000    L4-5  . Hernia repair  1987  . Knee surgery  1969    Patellar fracture fragment Lt  . Tonsillectomy    . Hernia repair  10/2003  . Back surgery  05/12/2005    L5  . Prostatectomy  04/16/2005  . Mass excision  04/26/2011    Procedure: MINOR EXCISION OF MASS;  Surgeon: Earnstine Regal, MD;  Location: [facility];  Service: General;  Laterality: Right;  Excise subcutaneous mass right axilla Minor Room  . Colonoscopy  1999    Negative; Dr Fuller Plan  . Colonoscopy  2004    tics, hemorrhoids  . Colonoscopy  2009    polyps (T.A.)  . Total knee arthroplasty Left 07/03/2013    Procedure: LEFT TOTAL KNEE ARTHROPLASTY;  Surgeon: Mauri Pole, MD;  Location: [facility];  Service: Orthopedics;  Laterality: Left;    Social History   Social History  . Marital Status: Married    Spouse Name: N/A  . Number of Children: N/A  . Years of Education: N/A   Occupational History  . ins[employer]    Social History Main Topics  . Smoking status: Former Research scientist (life sciences)  . Smokeless tobacco: Never Used     Comment: stopped smoking cigarettes 1983, 1 cigar /day 2001-2013  . Alcohol Use: 3.5 oz/week    7 drink(s) per week      Comment: scotch or wine daily  . Drug Use: No  . Sexual Activity: Not on file   Other Topics Concern  . Not on file   Social History Narrative   No diet          Review of Systems  Constitutional: Negative for fever, chills and fatigue.  Respiratory: Negative for cough, shortness of breath and wheezing.   Cardiovascular: Negative for chest pain, palpitations and leg swelling.  Gastrointestinal: Negative for diarrhea, constipation and blood in stool.  Musculoskeletal: Positive for arthralgias (arthritis in hands).  Neurological: Negative for dizziness, light-headedness and headaches.       Objective:   Filed Vitals:   05/27/15 1415  BP: 134/76  Pulse: 59  Temp: 98.6 F (37 C)  Resp: 16   Filed Weights   05/27/15 1415  Weight: 159 lb (72.122 kg)   Body mass index is 24.18 kg/(m^2).   Physical Exam Constitutional: Appears well-developed and well-nourished. No distress.  Neck: Neck supple. No tracheal deviation present. No thyromegaly present.  Right sided carotid bruit. No cervical adenopathy.   Cardiovascular: Normal rate, regular rhythm and normal heart sounds.   3/6 murmur systolic heard. Pulmonary/Chest: Effort normal and breath sounds normal. No respiratory distress. No wheezes.  Musculoskeletal: No edema.       Assessment & Plan:    See problem list for assessment and plan  Follow-up for a routine physical exam in a few months-blood work to be done at that time

## 2015-05-27 NOTE — Patient Instructions (Signed)
  We have reviewed your prior records including labs and tests today.   All other Health Maintenance issues reviewed.   All recommended immunizations and age-appropriate screenings are up-to-date.  No immunizations administered today.   Medications reviewed and updated.  No changes recommended at this time.  Your prescription(s) have been submitted to your pharmacy. Please take as directed and contact our office if you believe you are having problem(s) with the medication(s).  Please schedule followup in the spring for a Physical exam.

## 2015-05-27 NOTE — Progress Notes (Signed)
Pre visit review using our clinic review tool, if applicable. No additional management support is needed unless otherwise documented below in the visit note. 

## 2015-05-27 NOTE — Telephone Encounter (Signed)
I scheduled pt for his physical in April and he is wondering if he can come in a few days early to do labs. Please advise

## 2015-05-28 NOTE — Assessment & Plan Note (Addendum)
Typically gets blood work once a year Medication dose has been stable He will follow-up in the spring for physical exam and we will recheck his TSH at that time Clinically euthyroid Continue levothyroxine 25 g daily

## 2015-05-28 NOTE — Telephone Encounter (Signed)
Please advise, pt has [employer].

## 2015-05-28 NOTE — Assessment & Plan Note (Signed)
Lipid panel controlled Exercising regularly Has lost weight and eats a relatively healthy diet Continue simvastatin at current dose

## 2015-05-28 NOTE — Assessment & Plan Note (Signed)
Currently taking clonazepam nightly-only takes half of a pill Discussed potential long-term side effects of taking the medication regularly Discussed ways that he might be able to get off of medication He will try tapering off of the medication slowly Follow-up at his physical exam in a few months

## 2015-06-15 ENCOUNTER — Other Ambulatory Visit: Payer: Self-pay | Admitting: Internal Medicine

## 2015-07-30 ENCOUNTER — Other Ambulatory Visit: Payer: Self-pay | Admitting: Internal Medicine

## 2015-07-31 ENCOUNTER — Other Ambulatory Visit: Payer: Self-pay | Admitting: Emergency Medicine

## 2015-07-31 MED ORDER — FLUTICASONE PROPIONATE 50 MCG/ACT NA SUSP: NASAL | Status: DC

## 2015-08-12 ENCOUNTER — Encounter: Payer: Self-pay | Admitting: Gastroenterology

## 2015-08-18 ENCOUNTER — Encounter: Payer: Self-pay | Admitting: Internal Medicine

## 2015-09-08 ENCOUNTER — Other Ambulatory Visit: Payer: Self-pay | Admitting: Orthopedic Surgery

## 2015-09-08 DIAGNOSIS — M5416 Radiculopathy, lumbar region: Secondary | ICD-10-CM

## 2015-09-09 ENCOUNTER — Ambulatory Visit
Admission: RE | Admit: 2015-09-09 | Discharge: 2015-09-09 | Disposition: A | Discharge status: Home / Self Care | Payer: Medicare Other | Source: Ambulatory Visit | Attending: Orthopedic Surgery | Admitting: Orthopedic Surgery

## 2015-09-09 DIAGNOSIS — M5416 Radiculopathy, lumbar region: Secondary | ICD-10-CM

## 2015-09-10 ENCOUNTER — Ambulatory Visit (INDEPENDENT_AMBULATORY_CARE_PROVIDER_SITE_OTHER): Payer: Medicare Other | Admitting: Internal Medicine

## 2015-09-10 ENCOUNTER — Encounter: Payer: Self-pay | Admitting: Internal Medicine

## 2015-09-10 VITALS — BP 132/70 | HR 70 | Temp 98.3°F | Resp 16 | Ht 68.0 in | Wt 148.0 lb

## 2015-09-10 DIAGNOSIS — Z0000 Encounter for general adult medical examination without abnormal findings: Secondary | ICD-10-CM | POA: Diagnosis not present

## 2015-09-10 DIAGNOSIS — E785 Hyperlipidemia, unspecified: Secondary | ICD-10-CM

## 2015-09-10 DIAGNOSIS — R739 Hyperglycemia, unspecified: Secondary | ICD-10-CM

## 2015-09-10 DIAGNOSIS — E039 Hypothyroidism, unspecified: Secondary | ICD-10-CM | POA: Diagnosis not present

## 2015-09-10 NOTE — Progress Notes (Signed)
Pre visit review using our clinic review tool, if applicable. No additional management support is needed unless otherwise documented below in the visit note. 

## 2015-09-10 NOTE — Progress Notes (Signed)
Subjective:    Patient ID: Jeremy Waters, male    DOB: Jul 07, 1945, 70 y.o.   MRN: [id number]  HPI He is here to establish with a new pcp.     Back pain:  Last week he started to have back pain.  He saw a doc in a box and was given morphine.  It did not help.  He did see ortho and was given steroids - an injection and oral taper.  He had an MRI yesterday. His pain has decreased.  The back pain has improved, but he has an ache in his posterior upper thigh and numbness in the foot.   Many nights he wakes up with a sweats at night.  He denies fever, chills and sweats during the day.    Over the past year he has been trying to lose weight.  He exercises for an hour a day and has been doing that consistently until his back started hurting him.  He has lost weight since he was here last.  He eats whatever he wants.    Here for medicare wellness exam.   I have personally reviewed and have noted 1.The patient's medical and social history 2.Their use of alcohol, tobacco or illicit drugs 3.Their current medications and supplements 4.The patient's functional ability including ADL's, fall risks, home safety risks and hearing or visual impairment. 5.Diet and physical activities 6.Evidence for depression or mood disorders   Are there smokers in your home (other than you)? No  Risk Factors Exercise: daily Dietary issues discussed: well balanced  Cardiac risk factors: advanced age (older than 30 for men, 41 for women), hyperlipidemia  Depression Screen  Have you felt down, depressed or hopeless? No  Have you felt little interest or pleasure in doing things?  No Activities of Daily Living In your present state of health, do you have any difficulty performing the following activities?:  Driving? No Managing money?  No Feeding yourself? No Getting from bed to chair? No Climbing a flight of stairs? No Preparing food and eating?:  No Bathing or showering? No Getting dressed: No Getting to/using the toilet? No Moving around from place to place: No In the past year have you fallen or had a near fall?: No   Are you sexually active?  No  Do you have more than one partner?  N/A  Hearing Difficulties: No Do you often ask people to speak up or repeat themselves? No Do you experience ringing or noises in your ears? No Do you have difficulty understanding soft or whispered voices? No Vision:              Any change in vision: no              Up to date with eye exam:  Up to date  Memory:  Do you feel that you have a problem with memory? No  Do you often misplace items? No  Do you feel safe at home?  Yes  Cognitive Testing  Alert, Orientated? Yes  Normal Appearance? Yes  Recall of three objects?  Yes  Can perform simple calculations? Yes  Displays appropriate judgment? Yes  Can read the correct time from a watch face? Yes   Advanced Directives have been discussed with the patient? yes, in place  Medications and allergies reviewed with patient and updated if appropriate.  Patient Active Problem List   Diagnosis Date Noted  . Hyperglycemia 04/02/2014  . S/P left TKA 07/03/2013  . Left  knee DJD 06/07/2013  . Right carotid bruit 06/07/2013  . Hypothyroidism 03/03/2012  . Axillary mass, right 04/07/2011  . ROSACEA 03/19/2010  . ARTHRALGIA 03/19/2010  . MYALGIA 03/19/2010  . CERVICALGIA 09/05/2009  . CARPAL TUNNEL SYNDROME 06/17/2008  . DEGENERATIVE JOINT DISEASE, GENERALIZED 06/17/2008  . CERVICAL RADICULOPATHY, LEFT 06/17/2008  . PARESTHESIA, HANDS 06/17/2008  . Disturbance in sleep behavior 03/04/2008  . PROSTATE CANCER, HX OF 03/04/2008  . Hyperlipidemia 06/15/2007  . DIVERTICULOSIS, COLON W/O HEM 03/03/2007    Current Outpatient Prescriptions on File Prior to Visit  Medication Sig Dispense Refill  . Albuterol Sulfate (PROAIR RESPICLICK) 123XX123 (90 BASE) MCG/ACT AEPB Inhale 2 puffs into the lungs  every 6 (six) hours as needed. 1 each 2  . beta carotene 30 MG capsule Take 30 mg by mouth daily.      . clonazePAM (KLONOPIN) 0.5 MG tablet TAKE ONE TABLET AT BEDTIME AS NEEDED 30 tablet 1  . fluticasone (FLONASE) 50 MCG/ACT nasal spray PLACE 1 SPRAY INTO BOTH NOSTRILS DAILY. 16 g 11  . levothyroxine (SYNTHROID, LEVOTHROID) 25 MCG tablet Take 1 tablet (25 mcg total) by mouth daily before breakfast. 90 tablet 1  . simvastatin (ZOCOR) 20 MG tablet TAKE 1 TABLET (20 MG TOTAL) BY MOUTH AT BEDTIME. 90 tablet 1  . vitamin C (ASCORBIC ACID) 500 MG tablet Take 500 mg by mouth 2 (two) times daily.     . vitamin E 400 UNIT capsule Take 400 Units by mouth daily.       No current facility-administered medications on file prior to visit.    Past Medical History  Diagnosis Date  . Diverticulosis of colon     w/o hemorrage  . Hyperlipidemia   . Prostate cancer ([place])   . Colon polyp   . Inguinal hernia     left side   . Heart murmur     TOLD HE HAS A SLIGHT MURMUR  . Arthritis     OA / PAIN LEFT KNEE    Past Surgical History  Procedure Laterality Date  . Laminectomy  2000    L4-5  . Hernia repair  1987  . Knee surgery  1969    Patellar fracture fragment Lt  . Tonsillectomy    . Hernia repair  10/2003  . Back surgery  05/12/2005    L5  . Prostatectomy  04/16/2005  . Mass excision  04/26/2011    Procedure: MINOR EXCISION OF MASS;  Surgeon: Earnstine Regal, MD;  Location: [facility];  Service: General;  Laterality: Right;  Excise subcutaneous mass right axilla Minor Room  . Colonoscopy  1999    Negative; Dr Fuller Plan  . Colonoscopy  2004    tics, hemorrhoids  . Colonoscopy  2009    polyps (T.A.)  . Total knee arthroplasty Left 07/03/2013    Procedure: LEFT TOTAL KNEE ARTHROPLASTY;  Surgeon: Mauri Pole, MD;  Location: [facility];  Service: Orthopedics;  Laterality: Left;    Social History   Social History  . Marital Status: Married    Spouse Name: N/A  . Number of  Children: N/A  . Years of Education: N/A   Occupational History  . insur exe    Social History Main Topics  . Smoking status: Former Research scientist (life sciences)  . Smokeless tobacco: Never Used     Comment: stopped smoking cigarettes 1983, 1 cigar /day 2001-2013  . Alcohol Use: 3.5 oz/week    7 drink(s) per week     Comment: scotch  or wine daily  . Drug Use: No  . Sexual Activity: Not on file   Other Topics Concern  . Not on file   Social History Narrative   Exercises daily                Family History  Problem Relation Age of Onset  . Hypertension Paternal Uncle   . Alcohol abuse Paternal Uncle   . Stroke Paternal Uncle 60  . Lung cancer Mother   . COPD Mother   . Cancer Father     Bladder Cancer  . Prostate cancer Father   . Colon cancer Father 5  . Urolithiasis Father   . Lung cancer Sister   . Asthma Neg Hx   . Heart disease Neg Hx     Review of Systems  Constitutional: Positive for diaphoresis (at night only) and unexpected weight change (10 lbs over last 3 months - ?unexpected or expected.  exercises 1 hr a day). Negative for fever, chills, appetite change and fatigue.  HENT: Positive for hearing loss (wears hearing aid).   Eyes: Negative for visual disturbance.  Respiratory: Negative for cough, shortness of breath and wheezing.   Cardiovascular: Negative for chest pain, palpitations and leg swelling.  Gastrointestinal: Negative for nausea, abdominal pain, diarrhea, constipation and blood in stool.       No gerd  Genitourinary: Positive for difficulty urinating (one episode today). Negative for dysuria and hematuria.  Musculoskeletal: Negative for back pain.  Skin: Negative for color change and rash.  Neurological: Positive for weakness (right leg) and numbness (right foot). Negative for dizziness, light-headedness and headaches.  Psychiatric/Behavioral: Negative for dysphoric mood.       Objective:   Filed Vitals:   09/10/15 1336  BP: 132/70  Pulse: 70  Temp:  98.3 F (36.8 C)  Resp: 16   Filed Weights   09/10/15 1336  Weight: 148 lb (67.132 kg)   Body mass index is 22.51 kg/(m^2).   Physical Exam Constitutional: He appears well-developed and well-nourished. No distress.  HENT:  Head: Normocephalic and atraumatic.  Right Ear: External ear normal.  Left Ear: External ear normal.  Mouth/Throat: Oropharynx is clear and moist.  Normal ear canals and TM b/l  Eyes: Conjunctivae and EOM are normal.  Neck: Neck supple. No tracheal deviation present. No thyromegaly present.  No carotid bruit  Cardiovascular: Normal rate, regular rhythm, normal heart sounds and intact distal pulses.   No murmur heard. Pulmonary/Chest: Effort normal and breath sounds normal. No respiratory distress. He has no wheezes. He has no rales.  Abdominal: Soft. Bowel sounds are normal. He exhibits no distension. There is no tenderness.  Genitourinary: deferred  Musculoskeletal: He exhibits no edema. no lumbar spine pain with palpation Lymphadenopathy:    He has no cervical adenopathy.  Skin: Skin is warm and dry. He is not diaphoretic.  Psychiatric: He has a normal mood and affect. His behavior is normal.       Assessment & Plan:   Wellness Exam Immunizations  Up to date  Colonoscopy  Up to date  Eye exam up to date Hearing loss no hearing loss Memory concerns/difficulties - none Independent of ADLs  - fully independent  Physical exam: Screening blood work ordered Immunizations   Up to date  Colonoscopy   Up to date  Eye exams Up to date  EKG done last year Exercise - daily for one hour Weight - has lost weight over the past year, continues to lose weight -  may need to decrease exercise if weight loss continues Skin  -  Sees derm for skin checks Substance abuse - none    See Problem List for Assessment and Plan of chronic medical problems.

## 2015-09-10 NOTE — Assessment & Plan Note (Signed)
Check a1c 

## 2015-09-10 NOTE — Assessment & Plan Note (Signed)
Check tsh  Titrate med dose if needed  

## 2015-09-10 NOTE — Assessment & Plan Note (Signed)
Lipid panel controlled in past Recheck lipid panel, cmp Taking simvastatin 20 mg daily

## 2015-09-10 NOTE — Patient Instructions (Addendum)
[name] , Thank you for taking time to come for your Medicare Wellness Visit. I appreciate your ongoing commitment to your health goals. Please review the following plan we discussed and let me know if I can assist you in the future.   These are the goals we discussed: Goals    None      This is a list of the screening recommended for you and due dates:  Health Maintenance  Topic Date Due  . Flu Shot  12/23/2015  . Colon Cancer Screening  01/05/2016  . Tetanus Vaccine  05/24/2018  . Shingles Vaccine  Completed  .  Hepatitis C: One time screening is recommended by Center for Disease Control  (CDC) for  adults born from [birth year] through 1964.   Completed  . Pneumonia vaccines  Completed    Test(s) ordered today. Your results will be released to Jeremy Waters (or called to you) after review, usually within 72hours after test completion. If any changes need to be made, you will be notified at that same time.  All other Health Maintenance issues reviewed.   All recommended immunizations and age-appropriate screenings are up-to-date or discussed.  No immunizations administered today.   Medications reviewed and updated.  No changes recommended at this time.    Health Maintenance, Male A healthy lifestyle and preventative care can promote health and wellness.  Maintain regular health, dental, and eye exams.  Eat a healthy diet. Foods like vegetables, fruits, whole grains, low-fat dairy products, and lean protein foods contain the nutrients you need and are low in calories. Decrease your intake of foods high in solid fats, added sugars, and salt. Get information about a proper diet from your health care provider, if necessary.  Regular physical exercise is one of the most important things you can do for your health. Most adults should get at least 150 minutes of moderate-intensity exercise (any activity that increases your heart rate and causes you to sweat) each week. In addition, most adults  need muscle-strengthening exercises on 2 or more days a week.   Maintain a healthy weight. The body mass index (BMI) is a screening tool to identify possible weight problems. It provides an estimate of body fat based on height and weight. Your health care provider can find your BMI and can help you achieve or maintain a healthy weight. For males 20 years and older:  A BMI below 18.5 is considered underweight.  A BMI of 18.5 to 24.9 is normal.  A BMI of 25 to 29.9 is considered overweight.  A BMI of 30 and above is considered obese.  Maintain normal blood lipids and cholesterol by exercising and minimizing your intake of saturated fat. Eat a balanced diet with plenty of fruits and vegetables. Blood tests for lipids and cholesterol should begin at age 91 and be repeated every 5 years. If your lipid or cholesterol levels are high, you are over age 51, or you are at high risk for heart disease, you may need your cholesterol levels checked more frequently.Ongoing high lipid and cholesterol levels should be treated with medicines if diet and exercise are not working.  If you smoke, find out from your health care provider how to quit. If you do not use tobacco, do not start.  Lung cancer screening is recommended for adults aged 37-80 years who are at high risk for developing lung cancer because of a history of smoking. A yearly low-dose CT scan of the lungs is recommended for people who have  at least a 30-pack-year history of smoking and are current smokers or have quit within the past 15 years. A pack year of smoking is smoking an average of 1 pack of cigarettes a day for 1 year (for example, a 30-pack-year history of smoking could mean smoking 1 pack a day for 30 years or 2 packs a day for 15 years). Yearly screening should continue until the smoker has stopped smoking for at least 15 years. Yearly screening should be stopped for people who develop a health problem that would prevent them from having  lung cancer treatment.  If you choose to drink alcohol, do not have more than 2 drinks per day. One drink is considered to be 12 oz (360 mL) of beer, 5 oz (150 mL) of wine, or 1.5 oz (45 mL) of liquor.  Avoid the use of street drugs. Do not share needles with anyone. Ask for help if you need support or instructions about stopping the use of drugs.  High blood pressure causes heart disease and increases the risk of stroke. High blood pressure is more likely to develop in:  People who have blood pressure in the end of the normal range (100-139/85-89 mm Hg).  People who are overweight or obese.  People who are African American.  If you are 19-16 years of age, have your blood pressure checked every 3-5 years. If you are 3 years of age or older, have your blood pressure checked every year. You should have your blood pressure measured twice--once when you are at a hospital or clinic, and once when you are not at a hospital or clinic. Record the average of the two measurements. To check your blood pressure when you are not at a hospital or clinic, you can use:  An automated blood pressure machine at a pharmacy.  A home blood pressure monitor.  If you are 58-35 years old, ask your health care provider if you should take aspirin to prevent heart disease.  Diabetes screening involves taking a blood sample to check your fasting blood sugar level. This should be done once every 3 years after age 52 if you are at a normal weight and without risk factors for diabetes. Testing should be considered at a younger age or be carried out more frequently if you are overweight and have at least 1 risk factor for diabetes.  Colorectal cancer can be detected and often prevented. Most routine colorectal cancer screening begins at the age of 51 and continues through age 77. However, your health care provider may recommend screening at an earlier age if you have risk factors for colon cancer. On a yearly basis, your  health care provider may provide home test kits to check for hidden blood in the stool. A small camera at the end of a tube may be used to directly examine the colon (sigmoidoscopy or colonoscopy) to detect the earliest forms of colorectal cancer. Talk to your health care provider about this at age 55 when routine screening begins. A direct exam of the colon should be repeated every 5-10 years through age 31, unless early forms of precancerous polyps or small growths are found.  People who are at an increased risk for hepatitis B should be screened for this virus. You are considered at high risk for hepatitis B if:  You were born in a country where hepatitis B occurs often. Talk with your health care provider about which countries are considered high risk.  Your parents were born in a  high-risk country and you have not received a shot to protect against hepatitis B (hepatitis B vaccine).  You have HIV or AIDS.  You use needles to inject street drugs.  You live with, or have sex with, someone who has hepatitis B.  You are a man who has sex with other men (MSM).  You get hemodialysis treatment.  You take certain medicines for conditions like cancer, organ transplantation, and autoimmune conditions.  Hepatitis C blood testing is recommended for all people born from [birth year] through 1964 and any individual with known risk factors for hepatitis C.  Healthy men should no longer receive prostate-specific antigen (PSA) blood tests as part of routine cancer screening. Talk to your health care provider about prostate cancer screening.  Testicular cancer screening is not recommended for adolescents or adult males who have no symptoms. Screening includes self-exam, a health care provider exam, and other screening tests. Consult with your health care provider about any symptoms you have or any concerns you have about testicular cancer.  Practice safe sex. Use condoms and avoid high-risk sexual practices to  reduce the spread of sexually transmitted infections (STIs).  You should be screened for STIs, including gonorrhea and chlamydia if:  You are sexually active and are younger than 24 years.  You are older than 24 years, and your health care provider tells you that you are at risk for this type of infection.  Your sexual activity has changed since you were last screened, and you are at an increased risk for chlamydia or gonorrhea. Ask your health care provider if you are at risk.  If you are at risk of being infected with HIV, it is recommended that you take a prescription medicine daily to prevent HIV infection. This is called pre-exposure prophylaxis (PrEP). You are considered at risk if:  You are a man who has sex with other men (MSM).  You are a heterosexual man who is sexually active with multiple partners.  You take drugs by injection.  You are sexually active with a partner who has HIV.  Talk with your health care provider about whether you are at high risk of being infected with HIV. If you choose to begin PrEP, you should first be tested for HIV. You should then be tested every 3 months for as long as you are taking PrEP.  Use sunscreen. Apply sunscreen liberally and repeatedly throughout the day. You should seek shade when your shadow is shorter than you. Protect yourself by wearing long sleeves, pants, a wide-brimmed hat, and sunglasses year round whenever you are outdoors.  Tell your health care provider of new moles or changes in moles, especially if there is a change in shape or color. Also, tell your health care provider if a mole is larger than the size of a pencil eraser.  A one-time screening for abdominal aortic aneurysm (AAA) and surgical repair of large AAAs by ultrasound is recommended for men aged 51-75 years who are current or former smokers.  Stay current with your vaccines (immunizations).   This information is not intended to replace advice given to you by your  health care provider. Make sure you discuss any questions you have with your health care provider.   Document Released: 11/06/2007 Document Revised: 05/31/2014 Document Reviewed: 10/05/2010 Elsevier Interactive Patient Education [employer].

## 2015-09-11 ENCOUNTER — Other Ambulatory Visit (HOSPITAL_COMMUNITY): Payer: Self-pay | Admitting: Internal Medicine

## 2015-09-11 DIAGNOSIS — I6523 Occlusion and stenosis of bilateral carotid arteries: Secondary | ICD-10-CM

## 2015-09-15 ENCOUNTER — Encounter: Payer: Self-pay | Admitting: Internal Medicine

## 2015-09-15 ENCOUNTER — Ambulatory Visit (HOSPITAL_COMMUNITY)
Admission: RE | Admit: 2015-09-15 | Discharge: 2015-09-15 | Disposition: A | Discharge status: Home / Self Care | Payer: Medicare Other | Source: Ambulatory Visit | Attending: Cardiovascular Disease | Admitting: Cardiovascular Disease

## 2015-09-15 ENCOUNTER — Other Ambulatory Visit (INDEPENDENT_AMBULATORY_CARE_PROVIDER_SITE_OTHER): Payer: Medicare Other

## 2015-09-15 DIAGNOSIS — E785 Hyperlipidemia, unspecified: Secondary | ICD-10-CM

## 2015-09-15 DIAGNOSIS — I6523 Occlusion and stenosis of bilateral carotid arteries: Secondary | ICD-10-CM | POA: Diagnosis not present

## 2015-09-15 DIAGNOSIS — E039 Hypothyroidism, unspecified: Secondary | ICD-10-CM | POA: Diagnosis not present

## 2015-09-15 DIAGNOSIS — R739 Hyperglycemia, unspecified: Secondary | ICD-10-CM | POA: Diagnosis not present

## 2015-09-15 LAB — LIPID PANEL
Cholesterol: 221 mg/dL — ABNORMAL HIGH (ref 0–200)
HDL: 79.2 mg/dL (ref >39.00)
LDL Cholesterol: 126 mg/dL — ABNORMAL HIGH (ref 0–99)
NonHDL: 141.81
Total CHOL/HDL Ratio: 3
Triglycerides: 81 mg/dL (ref 0.0–149.0)
VLDL: 16.2 mg/dL (ref 0.0–40.0)

## 2015-09-15 LAB — HEMOGLOBIN A1C: Hgb A1c MFr Bld: 5.8 % (ref 4.6–6.5)

## 2015-09-15 LAB — COMPREHENSIVE METABOLIC PANEL
ALT: 26 U/L (ref 0–53)
AST: 21 U/L (ref 0–37)
Albumin: 4.2 g/dL (ref 3.5–5.2)
Alkaline Phosphatase: 56 U/L (ref 39–117)
BUN: 16 mg/dL (ref 6–23)
CO2: 28 mEq/L (ref 19–32)
Calcium: 9.7 mg/dL (ref 8.4–10.5)
Chloride: 104 mEq/L (ref 96–112)
Creatinine, Ser: 0.81 mg/dL (ref 0.40–1.50)
GFR: 100.56 mL/min (ref >60.00)
Glucose, Bld: 85 mg/dL (ref 70–99)
Potassium: 4.4 mEq/L (ref 3.5–5.1)
Sodium: 140 mEq/L (ref 135–145)
Total Bilirubin: 0.7 mg/dL (ref 0.2–1.2)
Total Protein: 7.3 g/dL (ref 6.0–8.3)

## 2015-09-15 LAB — CBC WITH DIFFERENTIAL/PLATELET
Basophils Absolute: 0 10*3/uL (ref 0.0–0.1)
Basophils Relative: 0.3 % (ref 0.0–3.0)
Eosinophils Absolute: 0.3 10*3/uL (ref 0.0–0.7)
Eosinophils Relative: 2.3 % (ref 0.0–5.0)
HCT: 42.2 % (ref 39.0–52.0)
Hemoglobin: 14.6 g/dL (ref 13.0–17.0)
Lymphocytes Relative: 18.8 % (ref 12.0–46.0)
Lymphs Abs: 2 10*3/uL (ref 0.7–4.0)
MCHC: 34.7 g/dL (ref 30.0–36.0)
MCV: 94.3 fl (ref 78.0–100.0)
Monocytes Absolute: 1.4 10*3/uL — ABNORMAL HIGH (ref 0.1–1.0)
Monocytes Relative: 13.1 % — ABNORMAL HIGH (ref 3.0–12.0)
Neutro Abs: 7.1 10*3/uL (ref 1.4–7.7)
Neutrophils Relative %: 65.5 % (ref 43.0–77.0)
Platelets: 271 10*3/uL (ref 150.0–400.0)
RBC: 4.47 Mil/uL (ref 4.22–5.81)
RDW: 14.1 % (ref 11.5–15.5)
WBC: 10.8 10*3/uL — ABNORMAL HIGH (ref 4.0–10.5)

## 2015-09-15 LAB — TSH: TSH: 3.88 u[IU]/mL (ref 0.35–4.50)

## 2015-09-16 ENCOUNTER — Encounter: Payer: Self-pay | Admitting: Internal Medicine

## 2015-09-20 ENCOUNTER — Encounter: Payer: Self-pay | Admitting: Internal Medicine

## 2015-10-01 ENCOUNTER — Ambulatory Visit: Payer: Self-pay | Admitting: Orthopedic Surgery

## 2015-10-01 NOTE — H&P (Signed)
Jeremy Waters is an 70 y.o. male.   Chief Complaint: Right leg numbness and weakness HPI: The patient is a 70 year old male who presents with back pain. The patient is here today in referral from Dr. Alvan Dame. The patient reports low back symptoms including pain which began 1 month(s) ago without any known injury. Symptoms are reported to be located in the low back and Symptoms include numbness (right foot, ankle, and left groin). The patient describes the pain as aching. The patient describes the severity of their symptoms as 1 / 10. Symptoms are exacerbated by standing (prolonged). The patient is not currently being treated for this problem. Prior to being seen today the patient was previously evaluated by Dr. Alvan Dame. Past evaluation has included MRI of the lumbar spine. Past treatment has included opioid analgesics (prescribed by Urgent Care), muscle relaxants (prescribed by an Urgent Care), corticosteroids, epidural injections and back surgery (2000 and 2005 by Dr. Louanne Skye). Note for "Back pain": The patient states that he has lost 54lbs. over the past year.  HISTORY OF PRESENT ILLNESS This is a very pleasant male who is an acquaintance of Dr. Alvan Dame, who has reported now a month's history of right lower extremity radicular pain. He was initially seen by Dr. Alvan Dame on April 13. He apparently has had right lower extremity radicular pain at that point for two weeks into the right leg. He had a history of a total knee on the left. He was seen in urgent care and was placed on Dilaudid and Norco. He did perform an injection and recommended an MRI. The patient underwent an MRI of the lumbar spine on April 18, which indicated a moderately large extruded disc herniation at the L5-S1, sequestered fragments severely affecting the S1-S2 nerve roots and severe right foraminal stenosis, which had progressed from 2005. He had a history of a lumbar decompression by Dr. Louanne Skye in 2005 as well as a history of a decompression at 4-5  and had been doing well prior to that and prior to the onset of leg pain, he had severe back pain. He is here with his wife. There is a disc herniation as well at 4-5 to the left. He has no left-sided symptoms. He has multilevel disc degeneration. The patient had no initial neuro deficit. It is reported though on the past two weeks he has had numbness and weakness where he drags his leg. He has had an epidural on April 26 by Dr. Nelva Bush. He had ongoing numbness from his surgery in 2005 at the lateral aspect of his thigh. He is taking less pain medicine and reports his pain is a 3 to 5 at this point in time. His main concern is a numbness and the weakness.  REVIEW OF SYSTEMS Review of systems is negative for fevers, chest pain, shortness of breath, unexplained recent weight loss, loss of bowel or bladder function, burning with urination, joint swelling, rashes, weakness or numbness, difficulty with balance, easy bruising, excessive thirst or frequent urination. He exercises.  Past Medical History  Diagnosis Date  . Diverticulosis of colon     w/o hemorrage  . Hyperlipidemia   . Prostate cancer ([place])   . Colon polyp   . Inguinal hernia     left side   . Heart murmur     TOLD HE HAS A SLIGHT MURMUR  . Arthritis     OA / PAIN LEFT KNEE    Past Surgical History  Procedure Laterality Date  . Laminectomy  2000    L4-5  . Hernia repair  1987  . Knee surgery  1969    Patellar fracture fragment Lt  . Tonsillectomy    . Hernia repair  10/2003  . Back surgery  05/12/2005    L5  . Prostatectomy  04/16/2005  . Mass excision  04/26/2011    Procedure: MINOR EXCISION OF MASS;  Surgeon: Earnstine Regal, MD;  Location: [facility];  Service: General;  Laterality: Right;  Excise subcutaneous mass right axilla Minor Room  . Colonoscopy  1999    Negative; Dr Fuller Plan  . Colonoscopy  2004    tics, hemorrhoids  . Colonoscopy  2009    polyps ([facility]  . Total knee arthroplasty Left 07/03/2013     Procedure: LEFT TOTAL KNEE ARTHROPLASTY;  Surgeon: Mauri Pole, MD;  Location: [facility];  Service: Orthopedics;  Laterality: Left;    Family History  Problem Relation Age of Onset  . Hypertension Paternal Uncle   . Alcohol abuse Paternal Uncle   . Stroke Paternal Uncle 34  . Lung cancer Mother   . COPD Mother   . Cancer Father     Bladder Cancer  . Prostate cancer Father   . Colon cancer Father 67  . Urolithiasis Father   . Lung cancer Sister   . Asthma Neg Hx   . Heart disease Neg Hx    Social History:  reports that he has quit smoking. He has never used smokeless tobacco. He reports that he drinks about 3.5 oz of alcohol per week. He reports that he does not use illicit drugs.  Allergies:  Allergies  Allergen Reactions  . Atorvastatin Other (See Comments)    Increased LFTs     (Not in a hospital admission)  No results found for this or any previous visit (from the past 48 hour(s)). No results found.  Review of Systems  Constitutional: Negative.   HENT: Negative.   Eyes: Negative.   Respiratory: Negative.   Cardiovascular: Negative.   Gastrointestinal: Negative.   Genitourinary: Negative.   Musculoskeletal: Positive for back pain.  Skin: Negative.   Neurological: Positive for sensory change and focal weakness.  Psychiatric/Behavioral: Negative.     There were no vitals taken for this visit. Physical Exam  Constitutional: He is oriented to person, place, and time. He appears well-developed.  HENT:  Head: Normocephalic.  Eyes: Pupils are equal, round, and reactive to light.  Neck: Normal range of motion.  Cardiovascular: Normal rate.   Respiratory: Effort normal.  GI: Soft.  Musculoskeletal:  He is a healthy male, well fit. Mood and affect is appropriate. He walks with an antalgic gait. Unable to heel walk. He is tender in the right proximal gluteus. Straight leg raises, buttock, thigh, and calf pain on the right and negative on the left. EHL is 3/5 on the  right compared to the left, particularly as well as dorsiflexion of the lesser digits. Quad is 5/5. He has decreased sensation at the lateral aspect of the thigh and over the L5-S1 dermatomes. He has diminished Achilles reflex on the left, decreased sensation at the L5 and S1 dermatome.   Neurological: He is alert and oriented to person, place, and time. He displays abnormal reflex.  Skin: Skin is warm and dry.    AP, lateral, flexion, and extension radiographs of the lumbar spine demonstrates multilevel disc degeneration. There are hemilaminotomies at 4-5 and at 5-1. Mild osteoarthrosis of his hips. He also is weak in  his evertors on his ankle.  MRI outside demonstrates a large disc herniation, extruded, compressing the S1 and S2 nerve roots and extending into the 5 foramen with associated neural foraminal stenosis. Compressing the L5, S1 and S2 nerve roots.  Assessment/Plan 1. Right lower extremity radicular pain, L5-S1 nerve root distribution with myotomal weakness, dermatomal dysesthesias with large neurocompressive lesion, history of lumbar decompression at L5-S1. 2. Multilevel disc degeneration.  I had extensive discussion with Mr. Kinnett concerning his current pathology, relevant anatomy and treatment options. We discussed the natural history of lumbar disc disease and disc herniations. He had severe pain, most likely just prior to his herniation, the back pain resolved and the acute onset of radicular pain was evident. The main concern is his foot drop and pronounced weakness in his leg that he indicates has been over two to three weeks of duration. The pain is reduced and typically, the pain will reduce in the instance where there is a progressive neurologic deficit, typically resolution of radiculopathy is evident when decrease in pain as well as in the presence of a normal neurologic function is noted. Again, pain is reduced in a severe compressive situation. He does have multilevel disc  degeneration and neural foraminal stenosis related to that. He had no problems prior to this herniation in terms of weakness in his foot, numbness, etc. At this point in time, he has failed conservative treatment and two options, either living with his current symptoms versus a decompression. The decompression has given the pronounced neurologic deficit in the presence of a large neurocompressive lesion. We discussed revision decompression at 5-1 and foraminotomy and the increased risks of CSF leak, epidural fibrosis, inability to access the rupture, DVT, neurologic deficit, etc. I would not recommend a concomitant fusion, it has been since 2005 since his previous discectomy, it does appear though he has multilevel disc degeneration and may have some residual foraminal narrowing related to his multilevel disc degeneration. He has minimal to no back pain at this point in time. I indicated he very well may require a multilevel fusion in the future, but we do not recommend that currently. We discussed the operative time and then the recovery. Given that it has been at least two weeks with a deficit, we discussed possibility of a permanent deficit, though certainly decompressing the nerve would allow recovery from that point on. I will place him in an ASO, discussed activity modification in terms of avoiding secondary injuries such as inversion injuries to the ankle and as this does affect the abductors as well as the evertors. I discussed this with he and his wife. In those instances where an individual has a known deficit and is improving, the expected observation would be an option. Again, from his discussion and the initial evaluation here, he was neurologically intact and then has developed into this current situation. I will see him back on Friday for a second examination to determine whether there is any interval improvement. He is to strictly comply with activity modification, avoid stretching of the nerve root,  utilize his ASO. He can utilize a stationary bike. He has had no history of DVT or MRSA. We will proceed with scheduling him for next week to obtain the appropriate authorization, secure an operative time and explore any clearances that might be required. Again, if there are any acute changes of his situation in the interim, he is to call. I appreciate the kind referral by Dr. Alvan Dame.  Plan microlumbar decompression L5-S1 right  Cecilie Kicks., PA-C for Dr. Tonita Cong 10/01/2015, 2:24 PM

## 2015-10-01 NOTE — Patient Instructions (Addendum)
Jeremy Waters  10/01/2015   Your procedure is scheduled on:    10/08/2015   [facility]  Report to [facility]  Entrance take [facility]  elevators to 3rd floor to  [place] at     [place] AM.  Call this number if you have problems the morning of surgery [phone]   Remember: ONLY 1 PERSON MAY GO WITH YOU TO SHORT STAY TO GET  READY MORNING OF [place].  Do not eat food or drink liquids :After Midnight. Tuesday NIGHT     Take these medicines the morning of surgery with A SIP OF WATER: Proair if needed and bring, Flonase, Synthroid                                 You may not have any metal on your body including hair pins and              piercings  Do not wear jewelry,  lotions, powders or perfumes, deodorant                           Men may shave face and neck.   Do not bring valuables to the hospital. [place].  Contacts, dentures or bridgework may not be worn into surgery.  Leave suitcase in the car. After surgery it may be brought to your room.     Special Instructions: coughing and deep breathing exercises, leg exercises               Please read over the following fact sheets you were given: _____________________________________________________________________             [facility] - Preparing for Surgery Before surgery, you can play an important role.  Because skin is not sterile, your skin needs to be as free of germs as possible.  You can reduce the number of germs on your skin by washing with CHG (chlorahexidine gluconate) soap before surgery.  CHG is an antiseptic cleaner which kills germs and bonds with the skin to continue killing germs even after washing. Please DO NOT use if you have an allergy to CHG or antibacterial soaps.  If your skin becomes reddened/irritated stop using the CHG and inform your nurse when you arrive at Short Stay. Do not shave (including legs and underarms)  for at least 48 hours prior to the first CHG shower.  You may shave your face/neck. Please follow these instructions carefully:  1.  Shower with CHG Soap the night before surgery and the  morning of Surgery.  2.  If you choose to wash your hair, wash your hair first as usual with your  normal  shampoo.  3.  After you shampoo, rinse your hair and body thoroughly to remove the  shampoo.                           4.  Use CHG as you would any other liquid soap.  You can apply chg directly  to the skin and wash  Gently with a scrungie or clean washcloth.  5.  Apply the CHG Soap to your body ONLY FROM THE NECK DOWN.   Do not use on face/ open                           Wound or open sores. Avoid contact with eyes, ears mouth and genitals (private parts).                       Wash face,  Genitals (private parts) with your normal soap.             6.  Wash thoroughly, paying special attention to the area where your surgery  will be performed.  7.  Thoroughly rinse your body with warm water from the neck down.  8.  DO NOT shower/wash with your normal soap after using and rinsing off  the CHG Soap.                9.  Pat yourself dry with a clean towel.            10.  Wear clean pajamas.            11.  Place clean sheets on your bed the night of your first shower and do not  sleep with pets. Day of Surgery : Do not apply any lotions/deodorants the morning of surgery.  Please wear clean clothes to the hospital/surgery center.  FAILURE TO FOLLOW THESE INSTRUCTIONS MAY RESULT IN THE CANCELLATION OF YOUR SURGERY PATIENT SIGNATURE_________________________________  NURSE SIGNATURE__________________________________  ________________________________________________________________________   Adam Phenix  An incentive spirometer is a tool that can help keep your lungs clear and active. This tool measures how well you are filling your lungs with each breath. Taking long deep  breaths may help reverse or decrease the chance of developing breathing (pulmonary) problems (especially infection) following:  A long period of time when you are unable to move or be active. BEFORE THE PROCEDURE   If the spirometer includes an indicator to show your best effort, your nurse or respiratory therapist will set it to a desired goal.  If possible, sit up straight or lean slightly forward. Try not to slouch.  Hold the incentive spirometer in an upright position. INSTRUCTIONS FOR USE  1. Sit on the edge of your bed if possible, or sit up as far as you can in bed or on a chair. 2. Hold the incentive spirometer in an upright position. 3. Breathe out normally. 4. Place the mouthpiece in your mouth and seal your lips tightly around it. 5. Breathe in slowly and as deeply as possible, raising the piston or the ball toward the top of the column. 6. Hold your breath for 3-5 seconds or for as long as possible. Allow the piston or ball to fall to the bottom of the column. 7. Remove the mouthpiece from your mouth and breathe out normally. 8. Rest for a few seconds and repeat Steps 1 through 7 at least 10 times every 1-2 hours when you are awake. Take your time and take a few normal breaths between deep breaths. 9. The spirometer may include an indicator to show your best effort. Use the indicator as a goal to work toward during each repetition. 10. After each set of 10 deep breaths, practice coughing to be sure your lungs are clear. If you have an incision (the cut made at the time of surgery),  support your incision when coughing by placing a pillow or rolled up towels firmly against it. Once you are able to get out of bed, walk around indoors and cough well. You may stop using the incentive spirometer when instructed by your caregiver.  RISKS AND COMPLICATIONS  Take your time so you do not get dizzy or light-headed.  If you are in pain, you may need to take or ask for pain medication before  doing incentive spirometry. It is harder to take a deep breath if you are having pain. AFTER USE  Rest and breathe slowly and easily.  It can be helpful to keep track of a log of your progress. Your caregiver can provide you with a simple table to help with this. If you are using the spirometer at home, follow these instructions: [place] IF:   You are having difficultly using the spirometer.  You have trouble using the spirometer as often as instructed.  Your pain medication is not giving enough relief while using the spirometer.  You develop fever of 100.5 F (38.1 C) or higher. SEEK IMMEDIATE MEDICAL CARE IF:   You cough up bloody sputum that had not been present before.  You develop fever of 102 F (38.9 C) or greater.  You develop worsening pain at or near the incision site. MAKE SURE YOU:   Understand these instructions.  Will watch your condition.  Will get help right away if you are not doing well or get worse. Document Released: 09/20/2006 Document Revised: 08/02/2011 Document Reviewed: 11/21/2006 [facility] Patient Information 2014 [place], [place].   ________________________________________________________________________

## 2015-10-02 ENCOUNTER — Telehealth: Payer: Self-pay | Admitting: Emergency Medicine

## 2015-10-02 NOTE — Telephone Encounter (Signed)
Spoke with pt to inform surgical clearance papers were ready for pick-up

## 2015-10-03 ENCOUNTER — Encounter (HOSPITAL_COMMUNITY)
Admission: RE | Admit: 2015-10-03 | Discharge: 2015-10-03 | Disposition: A | Discharge status: Home / Self Care | Payer: Medicare Other | Source: Ambulatory Visit | Attending: Specialist | Admitting: Specialist

## 2015-10-03 ENCOUNTER — Ambulatory Visit (HOSPITAL_COMMUNITY)
Admission: RE | Admit: 2015-10-03 | Discharge: 2015-10-03 | Disposition: A | Discharge status: Home / Self Care | Payer: Medicare Other | Source: Ambulatory Visit | Attending: Orthopedic Surgery | Admitting: Orthopedic Surgery

## 2015-10-03 ENCOUNTER — Other Ambulatory Visit: Payer: Self-pay

## 2015-10-03 ENCOUNTER — Encounter (HOSPITAL_COMMUNITY): Payer: Self-pay

## 2015-10-03 DIAGNOSIS — M5126 Other intervertebral disc displacement, lumbar region: Secondary | ICD-10-CM | POA: Insufficient documentation

## 2015-10-03 DIAGNOSIS — R9431 Abnormal electrocardiogram [ECG] [EKG]: Secondary | ICD-10-CM | POA: Diagnosis not present

## 2015-10-03 LAB — BASIC METABOLIC PANEL
Anion gap: 11 (ref 5–15)
BUN: 20 mg/dL (ref 6–20)
CO2: 25 mmol/L (ref 22–32)
Calcium: 9.5 mg/dL (ref 8.9–10.3)
Chloride: 107 mmol/L (ref 101–111)
Creatinine, Ser: 0.72 mg/dL (ref 0.61–1.24)
GFR calc Af Amer: >60 mL/min (ref >60)
GFR calc non Af Amer: >60 mL/min (ref >60)
Glucose, Bld: 84 mg/dL (ref 65–99)
Potassium: 4 mmol/L (ref 3.5–5.1)
Sodium: 143 mmol/L (ref 135–145)

## 2015-10-03 LAB — CBC
HCT: 40.7 % (ref 39.0–52.0)
Hemoglobin: 13.5 g/dL (ref 13.0–17.0)
MCH: 32 pg (ref 26.0–34.0)
MCHC: 33.2 g/dL (ref 30.0–36.0)
MCV: 96.4 fL (ref 78.0–100.0)
Platelets: 269 10*3/uL (ref 150–400)
RBC: 4.22 MIL/uL (ref 4.22–5.81)
RDW: 13.9 % (ref 11.5–15.5)
WBC: 4.6 10*3/uL (ref 4.0–10.5)

## 2015-10-03 LAB — SURGICAL PCR SCREEN
MRSA, PCR: NEGATIVE
Staphylococcus aureus: NEGATIVE

## 2015-10-07 NOTE — Anesthesia Preprocedure Evaluation (Addendum)
Anesthesia Evaluation  Patient identified by MRN, date of birth, ID band Patient awake    Reviewed: Allergy & Precautions, H&P , NPO status , Patient's Chart, lab work & pertinent test results  Airway Mallampati: I  TM Distance: >3 FB Neck ROM: Full    Dental no notable dental hx. (+) Teeth Intact, Dental Advisory Given   Pulmonary neg pulmonary ROS, former smoker,    Pulmonary exam normal breath sounds clear to auscultation       Cardiovascular negative cardio ROS   Rhythm:Regular Rate:Normal     Neuro/Psych negative neurological ROS  negative psych ROS   GI/Hepatic negative GI ROS, Neg liver ROS,   Endo/Other  Hypothyroidism   Renal/GU negative Renal ROS  negative genitourinary   Musculoskeletal  (+) Arthritis , Osteoarthritis,    Abdominal   Peds  Hematology negative hematology ROS (+)   Anesthesia Other Findings   Reproductive/Obstetrics negative OB ROS                            Anesthesia Physical Anesthesia Plan  ASA: II  Anesthesia Plan: General   Post-op Pain Management:    Induction: Intravenous  Airway Management Planned: Oral ETT  Additional Equipment:   Intra-op Plan:   Post-operative Plan: Extubation in OR  Informed Consent: I have reviewed the patients History and Physical, chart, labs and discussed the procedure including the risks, benefits and alternatives for the proposed anesthesia with the patient or authorized representative who has indicated his/her understanding and acceptance.   Dental advisory given  Plan Discussed with: CRNA  Anesthesia Plan Comments:         Anesthesia Quick Evaluation

## 2015-10-08 ENCOUNTER — Ambulatory Visit (HOSPITAL_COMMUNITY): Payer: Medicare Other | Admitting: Anesthesiology

## 2015-10-08 ENCOUNTER — Ambulatory Visit (HOSPITAL_COMMUNITY): Payer: Medicare Other

## 2015-10-08 ENCOUNTER — Observation Stay (HOSPITAL_COMMUNITY)
Admission: RE | Admit: 2015-10-08 | Discharge: 2015-10-09 | Disposition: A | Discharge status: Home / Self Care | Payer: Medicare Other | Source: Ambulatory Visit | Attending: Specialist | Admitting: Specialist

## 2015-10-08 ENCOUNTER — Encounter (HOSPITAL_COMMUNITY)
Admission: RE | Disposition: A | Discharge status: Home / Self Care | Payer: Self-pay | Source: Ambulatory Visit | Attending: Specialist

## 2015-10-08 ENCOUNTER — Encounter (HOSPITAL_COMMUNITY): Payer: Self-pay | Admitting: Anesthesiology

## 2015-10-08 DIAGNOSIS — E785 Hyperlipidemia, unspecified: Secondary | ICD-10-CM | POA: Diagnosis not present

## 2015-10-08 DIAGNOSIS — Z96652 Presence of left artificial knee joint: Secondary | ICD-10-CM | POA: Diagnosis not present

## 2015-10-08 DIAGNOSIS — M4806 Spinal stenosis, lumbar region: Secondary | ICD-10-CM | POA: Diagnosis not present

## 2015-10-08 DIAGNOSIS — E039 Hypothyroidism, unspecified: Secondary | ICD-10-CM | POA: Diagnosis not present

## 2015-10-08 DIAGNOSIS — Z8546 Personal history of malignant neoplasm of prostate: Secondary | ICD-10-CM | POA: Insufficient documentation

## 2015-10-08 DIAGNOSIS — Z419 Encounter for procedure for purposes other than remedying health state, unspecified: Secondary | ICD-10-CM

## 2015-10-08 DIAGNOSIS — M21379 Foot drop, unspecified foot: Secondary | ICD-10-CM | POA: Diagnosis not present

## 2015-10-08 DIAGNOSIS — M48061 Spinal stenosis, lumbar region without neurogenic claudication: Secondary | ICD-10-CM | POA: Diagnosis present

## 2015-10-08 DIAGNOSIS — M5127 Other intervertebral disc displacement, lumbosacral region: Secondary | ICD-10-CM | POA: Diagnosis not present

## 2015-10-08 DIAGNOSIS — Z87891 Personal history of nicotine dependence: Secondary | ICD-10-CM | POA: Insufficient documentation

## 2015-10-08 HISTORY — PX: LUMBAR LAMINECTOMY/DECOMPRESSION MICRODISCECTOMY: SHX5026

## 2015-10-08 LAB — TYPE AND SCREEN
ABO/RH(D): A POS
Antibody Screen: NEGATIVE

## 2015-10-08 SURGERY — LUMBAR LAMINECTOMY/DECOMPRESSION MICRODISCECTOMY 1 LEVEL
Anesthesia: General | Site: Back | Laterality: Right

## 2015-10-08 MED ORDER — SODIUM CHLORIDE 0.9 % IJ SOLN
INTRAMUSCULAR | Status: AC
  Filled 2015-10-08: qty 10

## 2015-10-08 MED ORDER — LACTATED RINGERS IV SOLN
INTRAVENOUS | Status: DC | PRN
  Administered 2015-10-08 (×2): via INTRAVENOUS

## 2015-10-08 MED ORDER — DEXAMETHASONE SODIUM PHOSPHATE 10 MG/ML IJ SOLN
INTRAMUSCULAR | Status: AC
  Filled 2015-10-08: qty 1

## 2015-10-08 MED ORDER — CLONAZEPAM 0.5 MG PO TABS
0.2500 mg | ORAL_TABLET | ORAL | Status: DC
  Filled 2015-10-08: qty 1

## 2015-10-08 MED ORDER — HYDROMORPHONE HCL 1 MG/ML IJ SOLN: 0.2500 mg | INTRAMUSCULAR | Status: DC | PRN

## 2015-10-08 MED ORDER — MENTHOL 3 MG MT LOZG: 1.0000 | LOZENGE | OROMUCOSAL | Status: DC | PRN

## 2015-10-08 MED ORDER — FLUTICASONE PROPIONATE 50 MCG/ACT NA SUSP
1.0000 | Freq: Every day | NASAL | Status: DC
  Filled 2015-10-08: qty 16

## 2015-10-08 MED ORDER — SUGAMMADEX SODIUM 200 MG/2ML IV SOLN
INTRAVENOUS | Status: DC | PRN
  Administered 2015-10-08: 200 mg via INTRAVENOUS

## 2015-10-08 MED ORDER — PROPOFOL 10 MG/ML IV BOLUS
INTRAVENOUS | Status: DC | PRN
  Administered 2015-10-08: 180 mg via INTRAVENOUS

## 2015-10-08 MED ORDER — CLONAZEPAM 0.5 MG PO TABS
0.2500 mg | ORAL_TABLET | ORAL | Status: DC
  Administered 2015-10-08: 0.25 mg via ORAL
  Filled 2015-10-08: qty 1

## 2015-10-08 MED ORDER — FENTANYL CITRATE (PF) 100 MCG/2ML IJ SOLN
INTRAMUSCULAR | Status: AC
  Filled 2015-10-08: qty 2

## 2015-10-08 MED ORDER — FENTANYL CITRATE (PF) 250 MCG/5ML IJ SOLN
INTRAMUSCULAR | Status: AC
Start: 2015-10-08 — End: 2015-10-08
  Filled 2015-10-08: qty 5

## 2015-10-08 MED ORDER — ROCURONIUM BROMIDE 50 MG/5ML IV SOLN
INTRAVENOUS | Status: AC
  Filled 2015-10-08: qty 1

## 2015-10-08 MED ORDER — HYDROMORPHONE HCL 1 MG/ML IJ SOLN: 0.5000 mg | INTRAMUSCULAR | Status: DC | PRN

## 2015-10-08 MED ORDER — ALUM & MAG HYDROXIDE-SIMETH 200-200-20 MG/5ML PO SUSP: 30.0000 mL | Freq: Four times a day (QID) | ORAL | Status: DC | PRN

## 2015-10-08 MED ORDER — POTASSIUM CHLORIDE IN NACL 20-0.45 MEQ/L-% IV SOLN
INTRAVENOUS | Status: DC
  Administered 2015-10-08: 15:00:00 via INTRAVENOUS
  Filled 2015-10-08 (×2): qty 1000

## 2015-10-08 MED ORDER — SODIUM CHLORIDE 0.9% FLUSH: 3.0000 mL | INTRAVENOUS | Status: DC | PRN

## 2015-10-08 MED ORDER — SUGAMMADEX SODIUM 200 MG/2ML IV SOLN
INTRAVENOUS | Status: AC
  Filled 2015-10-08: qty 2

## 2015-10-08 MED ORDER — ONDANSETRON HCL 4 MG/2ML IJ SOLN
INTRAMUSCULAR | Status: DC | PRN
  Administered 2015-10-08: 4 mg via INTRAVENOUS

## 2015-10-08 MED ORDER — MIDAZOLAM HCL 2 MG/2ML IJ SOLN
INTRAMUSCULAR | Status: AC
  Filled 2015-10-08: qty 2

## 2015-10-08 MED ORDER — METHOCARBAMOL 1000 MG/10ML IJ SOLN
500.0000 mg | Freq: Four times a day (QID) | INTRAVENOUS | Status: DC | PRN
  Filled 2015-10-08: qty 5

## 2015-10-08 MED ORDER — BISACODYL 5 MG PO TBEC: 5.0000 mg | DELAYED_RELEASE_TABLET | Freq: Every day | ORAL | Status: DC | PRN

## 2015-10-08 MED ORDER — LIDOCAINE HCL (CARDIAC) 20 MG/ML IV SOLN
INTRAVENOUS | Status: AC
  Filled 2015-10-08: qty 5

## 2015-10-08 MED ORDER — CEFAZOLIN SODIUM-DEXTROSE 2-4 GM/100ML-% IV SOLN
2.0000 g | Freq: Three times a day (TID) | INTRAVENOUS | Status: AC
  Administered 2015-10-08 – 2015-10-09 (×2): 2 g via INTRAVENOUS
  Filled 2015-10-08 (×2): qty 100

## 2015-10-08 MED ORDER — HYDROCODONE-ACETAMINOPHEN 5-325 MG PO TABS: 1.0000 | ORAL_TABLET | ORAL | Status: DC | PRN

## 2015-10-08 MED ORDER — OXYCODONE-ACETAMINOPHEN 5-325 MG PO TABS: 1.0000 | ORAL_TABLET | ORAL | Status: DC | PRN

## 2015-10-08 MED ORDER — PHENOL 1.4 % MT LIQD
1.0000 | OROMUCOSAL | Status: DC | PRN
  Filled 2015-10-08: qty 177

## 2015-10-08 MED ORDER — SODIUM CHLORIDE 0.9 % IR SOLN
Status: DC | PRN
  Administered 2015-10-08: 500 mL

## 2015-10-08 MED ORDER — LACTATED RINGERS IV SOLN: INTRAVENOUS | Status: DC

## 2015-10-08 MED ORDER — HEMOSTATIC AGENTS (NO CHARGE) OPTIME
TOPICAL | Status: DC | PRN
  Administered 2015-10-08: 1 via TOPICAL

## 2015-10-08 MED ORDER — ROCURONIUM BROMIDE 100 MG/10ML IV SOLN
INTRAVENOUS | Status: DC | PRN
  Administered 2015-10-08 (×2): 5 mg via INTRAVENOUS
  Administered 2015-10-08 (×2): 10 mg via INTRAVENOUS
  Administered 2015-10-08 (×3): 5 mg via INTRAVENOUS
  Administered 2015-10-08: 10 mg via INTRAVENOUS
  Administered 2015-10-08: 5 mg via INTRAVENOUS
  Administered 2015-10-08: 40 mg via INTRAVENOUS
  Administered 2015-10-08: 5 mg via INTRAVENOUS

## 2015-10-08 MED ORDER — SODIUM CHLORIDE 0.9% FLUSH
3.0000 mL | Freq: Two times a day (BID) | INTRAVENOUS | Status: DC
  Administered 2015-10-08 – 2015-10-09 (×2): 3 mL via INTRAVENOUS

## 2015-10-08 MED ORDER — BUPIVACAINE-EPINEPHRINE 0.5% -1:200000 IJ SOLN
INTRAMUSCULAR | Status: DC | PRN
  Administered 2015-10-08: 15 mL

## 2015-10-08 MED ORDER — DEXAMETHASONE SODIUM PHOSPHATE 10 MG/ML IJ SOLN
INTRAMUSCULAR | Status: DC | PRN
  Administered 2015-10-08: 10 mg via INTRAVENOUS

## 2015-10-08 MED ORDER — MAGNESIUM CITRATE PO SOLN: 1.0000 | Freq: Once | ORAL | Status: DC | PRN

## 2015-10-08 MED ORDER — ACETAMINOPHEN 650 MG RE SUPP: 650.0000 mg | RECTAL | Status: DC | PRN

## 2015-10-08 MED ORDER — METHOCARBAMOL 500 MG PO TABS
500.0000 mg | ORAL_TABLET | Freq: Four times a day (QID) | ORAL | Status: DC | PRN
  Administered 2015-10-08 – 2015-10-09 (×2): 500 mg via ORAL
  Filled 2015-10-08 (×2): qty 1

## 2015-10-08 MED ORDER — CEFAZOLIN SODIUM-DEXTROSE 2-4 GM/100ML-% IV SOLN
2.0000 g | INTRAVENOUS | Status: AC
  Administered 2015-10-08: 2 g via INTRAVENOUS
  Filled 2015-10-08: qty 100

## 2015-10-08 MED ORDER — HYDROCODONE-ACETAMINOPHEN 5-325 MG PO TABS: 1.0000 | ORAL_TABLET | Freq: Four times a day (QID) | ORAL | Status: DC | PRN

## 2015-10-08 MED ORDER — MIDAZOLAM HCL 5 MG/5ML IJ SOLN
INTRAMUSCULAR | Status: DC | PRN
  Administered 2015-10-08: 2 mg via INTRAVENOUS

## 2015-10-08 MED ORDER — CEFAZOLIN SODIUM-DEXTROSE 2-4 GM/100ML-% IV SOLN
INTRAVENOUS | Status: AC
  Filled 2015-10-08: qty 100

## 2015-10-08 MED ORDER — FENTANYL CITRATE (PF) 100 MCG/2ML IJ SOLN
INTRAMUSCULAR | Status: DC | PRN
  Administered 2015-10-08: 50 ug via INTRAVENOUS
  Administered 2015-10-08: 25 ug via INTRAVENOUS
  Administered 2015-10-08: 100 ug via INTRAVENOUS
  Administered 2015-10-08: 25 ug via INTRAVENOUS
  Administered 2015-10-08: 50 ug via INTRAVENOUS

## 2015-10-08 MED ORDER — LEVOTHYROXINE SODIUM 25 MCG PO TABS
25.0000 ug | ORAL_TABLET | Freq: Every day | ORAL | Status: DC
  Administered 2015-10-09: 25 ug via ORAL
  Filled 2015-10-08: qty 1

## 2015-10-08 MED ORDER — PROPOFOL 10 MG/ML IV BOLUS
INTRAVENOUS | Status: AC
  Filled 2015-10-08: qty 40

## 2015-10-08 MED ORDER — LIDOCAINE HCL (CARDIAC) 20 MG/ML IV SOLN
INTRAVENOUS | Status: DC | PRN
  Administered 2015-10-08: 50 mg via INTRAVENOUS

## 2015-10-08 MED ORDER — GLYCOPYRROLATE 0.2 MG/ML IJ SOLN
INTRAMUSCULAR | Status: DC | PRN
  Administered 2015-10-08: 0.2 mg via INTRAVENOUS

## 2015-10-08 MED ORDER — BUPIVACAINE-EPINEPHRINE (PF) 0.5% -1:200000 IJ SOLN
INTRAMUSCULAR | Status: AC
Start: 2015-10-08 — End: 2015-10-08
  Filled 2015-10-08: qty 30

## 2015-10-08 MED ORDER — SODIUM CHLORIDE 0.9 % IR SOLN
Status: AC
  Filled 2015-10-08: qty 1

## 2015-10-08 MED ORDER — POLYETHYLENE GLYCOL 3350 17 G PO PACK: 17.0000 g | PACK | Freq: Every day | ORAL | Status: DC | PRN

## 2015-10-08 MED ORDER — DOCUSATE SODIUM 100 MG PO CAPS
100.0000 mg | ORAL_CAPSULE | Freq: Two times a day (BID) | ORAL | Status: DC
  Administered 2015-10-08 – 2015-10-09 (×2): 100 mg via ORAL
  Filled 2015-10-08 (×3): qty 1

## 2015-10-08 MED ORDER — ZOLPIDEM TARTRATE 5 MG PO TABS: 5.0000 mg | ORAL_TABLET | Freq: Every evening | ORAL | Status: DC | PRN

## 2015-10-08 MED ORDER — DOCUSATE SODIUM 100 MG PO CAPS: 100.0000 mg | ORAL_CAPSULE | Freq: Two times a day (BID) | ORAL | Status: DC

## 2015-10-08 MED ORDER — EPHEDRINE SULFATE 50 MG/ML IJ SOLN
INTRAMUSCULAR | Status: DC | PRN
  Administered 2015-10-08 (×5): 5 mg via INTRAVENOUS
  Administered 2015-10-08: 10 mg via INTRAVENOUS
  Administered 2015-10-08 (×2): 5 mg via INTRAVENOUS

## 2015-10-08 MED ORDER — ONDANSETRON HCL 4 MG/2ML IJ SOLN
INTRAMUSCULAR | Status: AC
Start: 2015-10-08 — End: 2015-10-08
  Filled 2015-10-08: qty 2

## 2015-10-08 MED ORDER — ACETAMINOPHEN 325 MG PO TABS: 650.0000 mg | ORAL_TABLET | ORAL | Status: DC | PRN

## 2015-10-08 MED ORDER — ONDANSETRON HCL 4 MG/2ML IJ SOLN: 4.0000 mg | INTRAMUSCULAR | Status: DC | PRN

## 2015-10-08 MED ORDER — ALBUTEROL SULFATE (2.5 MG/3ML) 0.083% IN NEBU: 3.0000 mL | INHALATION_SOLUTION | Freq: Four times a day (QID) | RESPIRATORY_TRACT | Status: DC | PRN

## 2015-10-08 MED ORDER — SODIUM CHLORIDE 0.9 % IV SOLN: 250.0000 mL | INTRAVENOUS | Status: DC

## 2015-10-08 SURGICAL SUPPLY — 44 items
AGENT HMST SPONGE THK3/8 (HEMOSTASIS) ×1
CLEANER TIP ELECTROSURG 2X2 (MISCELLANEOUS) ×2 IMPLANT
CLOTH 2% CHLOROHEXIDINE 3PK (PERSONAL CARE ITEMS) ×2 IMPLANT
DRAPE MICROSCOPE LEICA (MISCELLANEOUS) ×2 IMPLANT
DRAPE POUCH INSTRU U-SHP 10X18 (DRAPES) ×2 IMPLANT
DRAPE SHEET LG 3/4 BI-LAMINATE (DRAPES) ×2 IMPLANT
DRAPE SURG 17X11 SM STRL (DRAPES) ×2 IMPLANT
DRAPE UTILITY XL STRL (DRAPES) ×2 IMPLANT
DRSG AQUACEL AG ADV 3.5X 4 (GAUZE/BANDAGES/DRESSINGS) ×1 IMPLANT
DURAPREP 26ML APPLICATOR (WOUND CARE) ×2 IMPLANT
ELECT BLADE TIP CTD 4 INCH (ELECTRODE) ×1 IMPLANT
ELECT REM PT RETURN 9FT ADLT (ELECTROSURGICAL) ×2
ELECTRODE REM PT RTRN 9FT ADLT (ELECTROSURGICAL) ×1 IMPLANT
GLOVE BIOGEL PI IND STRL 7.0 (GLOVE) ×1 IMPLANT
GLOVE BIOGEL PI IND STRL 7.5 (GLOVE) IMPLANT
GLOVE BIOGEL PI INDICATOR 7.0 (GLOVE) ×1
GLOVE BIOGEL PI INDICATOR 7.5 (GLOVE) ×4
GLOVE SURG SS PI 7.0 STRL IVOR (GLOVE) ×2 IMPLANT
GLOVE SURG SS PI 7.5 STRL IVOR (GLOVE) ×2 IMPLANT
GLOVE SURG SS PI 8.0 STRL IVOR (GLOVE) ×4 IMPLANT
GOWN STRL REUS W/TWL XL LVL3 (GOWN DISPOSABLE) ×5 IMPLANT
HEMOSTAT SPONGE AVITENE ULTRA (HEMOSTASIS) ×1 IMPLANT
IV CATH 14GX2 1/4 (CATHETERS) ×2 IMPLANT
KIT BASIN OR (CUSTOM PROCEDURE TRAY) ×2 IMPLANT
KIT POSITIONING SURG ANDREWS (MISCELLANEOUS) ×2 IMPLANT
MANIFOLD NEPTUNE II (INSTRUMENTS) ×2 IMPLANT
NDL SPNL 18GX3.5 QUINCKE PK (NEEDLE) ×2 IMPLANT
NEEDLE SPNL 18GX3.5 QUINCKE PK (NEEDLE) ×4 IMPLANT
PACK LAMINECTOMY ORTHO (CUSTOM PROCEDURE TRAY) ×2 IMPLANT
PATTIES SURGICAL .75X.75 (GAUZE/BANDAGES/DRESSINGS) ×1 IMPLANT
RUBBERBAND STERILE (MISCELLANEOUS) ×4 IMPLANT
STAPLER VISISTAT (STAPLE) IMPLANT
SUT NURALON 4 0 TR CR/8 (SUTURE) IMPLANT
SUT PROLENE 3 0 PS 2 (SUTURE) ×2 IMPLANT
SUT VIC AB 1 CT1 27 (SUTURE) ×2
SUT VIC AB 1 CT1 27XBRD ANTBC (SUTURE) IMPLANT
SUT VIC AB 1-0 CT2 27 (SUTURE) ×3 IMPLANT
SUT VIC AB 2-0 CT1 27 (SUTURE)
SUT VIC AB 2-0 CT1 TAPERPNT 27 (SUTURE) IMPLANT
SUT VIC AB 2-0 CT2 27 (SUTURE) ×2 IMPLANT
SYR 3ML LL SCALE MARK (SYRINGE) IMPLANT
TOWEL OR 17X26 10 PK STRL BLUE (TOWEL DISPOSABLE) ×2 IMPLANT
TOWEL OR NON WOVEN STRL DISP B (DISPOSABLE) ×1 IMPLANT
YANKAUER SUCT BULB TIP NO VENT (SUCTIONS) IMPLANT

## 2015-10-08 NOTE — Transfer of Care (Signed)
Immediate Anesthesia Transfer of Care Note  Patient: Jeremy Waters  Procedure(s) Performed: Procedure(s): MICRO LUMBAR DECOMPRESSION L5-S1 ON RIGHT  (Right)  Patient Location: PACU  Anesthesia Type:General  Level of Consciousness:  sedated, patient cooperative and responds to stimulation  Airway & Oxygen Therapy:Patient Spontanous Breathing and Patient connected to face mask oxgen  Post-op Assessment:  Report given to PACU RN and Post -op Vital signs reviewed and stable  Post vital signs:  Reviewed and stable  Last Vitals:  Filed Vitals:   10/08/15 0643 10/08/15 1137  BP: 127/73 138/79  Pulse: 55 90  Temp: 36.5 C   Resp: 18     Complications: No apparent anesthesia complications

## 2015-10-08 NOTE — Brief Op Note (Signed)
10/08/2015  11:22 AM  PATIENT:  Jeremy Waters  70 y.o. male  PRE-OPERATIVE DIAGNOSIS:  HNP STENOSIS L5-S1  POST-OPERATIVE DIAGNOSIS:  HNP STENOSIS L5-S1  PROCEDURE:  Procedure(s): MICRO LUMBAR DECOMPRESSION L5-S1 ON RIGHT  (Right)  SURGEON:  Surgeon(s) and Role:    * Susa Day, MD - Primary  PHYSICIAN ASSISTANT:   ASSISTANTS:  teBissell   ANESTHESIA:   general  EBL:  Total I/O In: 1600 [I.V.:1600] Out: 600 [Urine:500; Blood:100]  BLOOD ADMINISTERED:none  DRAINS: none   LOCAL MEDICATIONS USED:  MARCAINE     SPECIMEN:  Source of Specimen:  L5S1  DISPOSITION OF SPECIMEN:  PATHOLOGY   COUNTS:  YES  TOURNIQUET:  * No tourniquets in log *  DICTATION: [id number]    PLAN OF CARE: Admit for overnight observation  PATIENT DISPOSITION:  PACU - hemodynamically stable.   Delay start of Pharmacological VTE agent (>24hrs) due to surgical blood loss or risk of bleeding: yes

## 2015-10-08 NOTE — H&P (View-Only) (Signed)
Jeremy Waters is an 70 y.o. male.   Chief Complaint: Right leg numbness and weakness HPI: The patient is a 70 year old male who presents with back pain. The patient is here today in referral from Dr. Alvan Dame. The patient reports low back symptoms including pain which began 1 month(s) ago without any known injury. Symptoms are reported to be located in the low back and Symptoms include numbness (right foot, ankle, and left groin). The patient describes the pain as aching. The patient describes the severity of their symptoms as 1 / 10. Symptoms are exacerbated by standing (prolonged). The patient is not currently being treated for this problem. Prior to being seen today the patient was previously evaluated by Dr. Alvan Dame. Past evaluation has included MRI of the lumbar spine. Past treatment has included opioid analgesics (prescribed by Urgent Care), muscle relaxants (prescribed by an Urgent Care), corticosteroids, epidural injections and back surgery (2000 and 2005 by Dr. Louanne Skye). Note for "Back pain": The patient states that he has lost 54lbs. over the past year.  HISTORY OF PRESENT ILLNESS This is a very pleasant male who is an acquaintance of Dr. Alvan Dame, who has reported now a month's history of right lower extremity radicular pain. He was initially seen by Dr. Alvan Dame on April 13. He apparently has had right lower extremity radicular pain at that point for two weeks into the right leg. He had a history of a total knee on the left. He was seen in urgent care and was placed on Dilaudid and Norco. He did perform an injection and recommended an MRI. The patient underwent an MRI of the lumbar spine on April 18, which indicated a moderately large extruded disc herniation at the L5-S1, sequestered fragments severely affecting the S1-S2 nerve roots and severe right foraminal stenosis, which had progressed from 2005. He had a history of a lumbar decompression by Dr. Louanne Skye in 2005 as well as a history of a decompression at 4-5  and had been doing well prior to that and prior to the onset of leg pain, he had severe back pain. He is here with his wife. There is a disc herniation as well at 4-5 to the left. He has no left-sided symptoms. He has multilevel disc degeneration. The patient had no initial neuro deficit. It is reported though on the past two weeks he has had numbness and weakness where he drags his leg. He has had an epidural on April 26 by Dr. Nelva Bush. He had ongoing numbness from his surgery in 2005 at the lateral aspect of his thigh. He is taking less pain medicine and reports his pain is a 3 to 5 at this point in time. His main concern is a numbness and the weakness.  REVIEW OF SYSTEMS Review of systems is negative for fevers, chest pain, shortness of breath, unexplained recent weight loss, loss of bowel or bladder function, burning with urination, joint swelling, rashes, weakness or numbness, difficulty with balance, easy bruising, excessive thirst or frequent urination. He exercises.  Past Medical History  Diagnosis Date  . Diverticulosis of colon     w/o hemorrage  . Hyperlipidemia   . Prostate cancer ([place])   . Colon polyp   . Inguinal hernia     left side   . Heart murmur     TOLD HE HAS A SLIGHT MURMUR  . Arthritis     OA / PAIN LEFT KNEE    Past Surgical History  Procedure Laterality Date  . Laminectomy  2000    L4-5  . Hernia repair  1987  . Knee surgery  1969    Patellar fracture fragment Lt  . Tonsillectomy    . Hernia repair  10/2003  . Back surgery  05/12/2005    L5  . Prostatectomy  04/16/2005  . Mass excision  04/26/2011    Procedure: MINOR EXCISION OF MASS;  Surgeon: Earnstine Regal, MD;  Location: [facility];  Service: General;  Laterality: Right;  Excise subcutaneous mass right axilla Minor Room  . Colonoscopy  1999    Negative; Dr Fuller Plan  . Colonoscopy  2004    tics, hemorrhoids  . Colonoscopy  2009    polyps ([place]  . Total knee arthroplasty Left 07/03/2013     Procedure: LEFT TOTAL KNEE ARTHROPLASTY;  Surgeon: Mauri Pole, MD;  Location: [facility];  Service: Orthopedics;  Laterality: Left;    Family History  Problem Relation Age of Onset  . Hypertension Paternal Uncle   . Alcohol abuse Paternal Uncle   . Stroke Paternal Uncle 36  . Lung cancer Mother   . COPD Mother   . Cancer Father     Bladder Cancer  . Prostate cancer Father   . Colon cancer Father 24  . Urolithiasis Father   . Lung cancer Sister   . Asthma Neg Hx   . Heart disease Neg Hx    Social History:  reports that he has quit smoking. He has never used smokeless tobacco. He reports that he drinks about 3.5 oz of alcohol per week. He reports that he does not use illicit drugs.  Allergies:  Allergies  Allergen Reactions  . Atorvastatin Other (See Comments)    Increased LFTs     (Not in a hospital admission)  No results found for this or any previous visit (from the past 48 hour(s)). No results found.  Review of Systems  Constitutional: Negative.   HENT: Negative.   Eyes: Negative.   Respiratory: Negative.   Cardiovascular: Negative.   Gastrointestinal: Negative.   Genitourinary: Negative.   Musculoskeletal: Positive for back pain.  Skin: Negative.   Neurological: Positive for sensory change and focal weakness.  Psychiatric/Behavioral: Negative.     There were no vitals taken for this visit. Physical Exam  Constitutional: He is oriented to person, place, and time. He appears well-developed.  HENT:  Head: Normocephalic.  Eyes: Pupils are equal, round, and reactive to light.  Neck: Normal range of motion.  Cardiovascular: Normal rate.   Respiratory: Effort normal.  GI: Soft.  Musculoskeletal:  He is a healthy male, well fit. Mood and affect is appropriate. He walks with an antalgic gait. Unable to heel walk. He is tender in the right proximal gluteus. Straight leg raises, buttock, thigh, and calf pain on the right and negative on the left. EHL is 3/5 on the  right compared to the left, particularly as well as dorsiflexion of the lesser digits. Quad is 5/5. He has decreased sensation at the lateral aspect of the thigh and over the L5-S1 dermatomes. He has diminished Achilles reflex on the left, decreased sensation at the L5 and S1 dermatome.   Neurological: He is alert and oriented to person, place, and time. He displays abnormal reflex.  Skin: Skin is warm and dry.    AP, lateral, flexion, and extension radiographs of the lumbar spine demonstrates multilevel disc degeneration. There are hemilaminotomies at 4-5 and at 5-1. Mild osteoarthrosis of his hips. He also is weak in  his evertors on his ankle.  MRI outside demonstrates a large disc herniation, extruded, compressing the S1 and S2 nerve roots and extending into the 5 foramen with associated neural foraminal stenosis. Compressing the L5, S1 and S2 nerve roots.  Assessment/Plan 1. Right lower extremity radicular pain, L5-S1 nerve root distribution with myotomal weakness, dermatomal dysesthesias with large neurocompressive lesion, history of lumbar decompression at L5-S1. 2. Multilevel disc degeneration.  I had extensive discussion with Mr. Ramberg concerning his current pathology, relevant anatomy and treatment options. We discussed the natural history of lumbar disc disease and disc herniations. He had severe pain, most likely just prior to his herniation, the back pain resolved and the acute onset of radicular pain was evident. The main concern is his foot drop and pronounced weakness in his leg that he indicates has been over two to three weeks of duration. The pain is reduced and typically, the pain will reduce in the instance where there is a progressive neurologic deficit, typically resolution of radiculopathy is evident when decrease in pain as well as in the presence of a normal neurologic function is noted. Again, pain is reduced in a severe compressive situation. He does have multilevel disc  degeneration and neural foraminal stenosis related to that. He had no problems prior to this herniation in terms of weakness in his foot, numbness, etc. At this point in time, he has failed conservative treatment and two options, either living with his current symptoms versus a decompression. The decompression has given the pronounced neurologic deficit in the presence of a large neurocompressive lesion. We discussed revision decompression at 5-1 and foraminotomy and the increased risks of CSF leak, epidural fibrosis, inability to access the rupture, DVT, neurologic deficit, etc. I would not recommend a concomitant fusion, it has been since 2005 since his previous discectomy, it does appear though he has multilevel disc degeneration and may have some residual foraminal narrowing related to his multilevel disc degeneration. He has minimal to no back pain at this point in time. I indicated he very well may require a multilevel fusion in the future, but we do not recommend that currently. We discussed the operative time and then the recovery. Given that it has been at least two weeks with a deficit, we discussed possibility of a permanent deficit, though certainly decompressing the nerve would allow recovery from that point on. I will place him in an ASO, discussed activity modification in terms of avoiding secondary injuries such as inversion injuries to the ankle and as this does affect the abductors as well as the evertors. I discussed this with he and his wife. In those instances where an individual has a known deficit and is improving, the expected observation would be an option. Again, from his discussion and the initial evaluation here, he was neurologically intact and then has developed into this current situation. I will see him back on Friday for a second examination to determine whether there is any interval improvement. He is to strictly comply with activity modification, avoid stretching of the nerve root,  utilize his ASO. He can utilize a stationary bike. He has had no history of DVT or MRSA. We will proceed with scheduling him for next week to obtain the appropriate authorization, secure an operative time and explore any clearances that might be required. Again, if there are any acute changes of his situation in the interim, he is to call. I appreciate the kind referral by Dr. Alvan Dame.  Plan microlumbar decompression L5-S1 right  Cecilie Kicks., PA-C for Dr. Tonita Cong 10/01/2015, 2:24 PM

## 2015-10-08 NOTE — Anesthesia Procedure Notes (Signed)
Procedure Name: Intubation Date/Time: 10/08/2015 8:32 AM Performed by: Patriciaann Rabanal, Virgel Gess Pre-anesthesia Checklist: Patient identified, Emergency Drugs available, Suction available, Patient being monitored and Timeout performed Patient Re-evaluated:Patient Re-evaluated prior to inductionOxygen Delivery Method: Circle system utilized Preoxygenation: Pre-oxygenation with 100% oxygen Intubation Type: IV induction Ventilation: Mask ventilation without difficulty Laryngoscope Size: Mac and 4 Grade View: Grade I Tube type: Oral Tube size: 7.5 mm Number of attempts: 1 Airway Equipment and Method: Stylet Placement Confirmation: ETT inserted through vocal cords under direct vision,  positive ETCO2,  CO2 detector and breath sounds checked- equal and bilateral Secured at: 22 cm Tube secured with: Tape Dental Injury: Teeth and Oropharynx as per pre-operative assessment

## 2015-10-08 NOTE — Discharge Instructions (Signed)

## 2015-10-08 NOTE — Anesthesia Postprocedure Evaluation (Signed)
Anesthesia Post Note  Patient: Marquist Dumaine Jacobowitz  Procedure(s) Performed: Procedure(s) (LRB): MICRO LUMBAR DECOMPRESSION L4-L5 AND L5-S1 ON RIGHT  (Right)  Patient location during evaluation: PACU Anesthesia Type: General Level of consciousness: awake and alert Pain management: pain level controlled Vital Signs Assessment: post-procedure vital signs reviewed and stable Respiratory status: spontaneous breathing, nonlabored ventilation, respiratory function stable and patient connected to nasal cannula oxygen Cardiovascular status: blood pressure returned to baseline and stable Postop Assessment: no signs of nausea or vomiting Anesthetic complications: no    Last Vitals:  Filed Vitals:   10/08/15 1245 10/08/15 1300  BP: 136/70 125/83  Pulse: 79 78  Temp:  36.6 C  Resp: 13 8    Last Pain: There were no vitals filed for this visit.               Alvy Alsop,W. EDMOND

## 2015-10-08 NOTE — Interval H&P Note (Signed)
History and Physical Interval Note:  10/08/2015 8:31 AM  Jeremy Waters  has presented today for surgery, with the diagnosis of HNP STENOSIS L5-S1  The various methods of treatment have been discussed with the patient and family. After consideration of risks, benefits and other options for treatment, the patient has consented to  Procedure(s): MICRO LUMBAR DECOMPRESSION L5-S1 ON RIGHT  (Right) as a surgical intervention .  The patient's history has been reviewed, patient examined, no change in status, stable for surgery.  I have reviewed the patient's chart and labs.  Questions were answered to the patient's satisfaction.     Marcy Sookdeo C

## 2015-10-09 ENCOUNTER — Telehealth: Payer: Self-pay | Admitting: *Deleted

## 2015-10-09 DIAGNOSIS — M5127 Other intervertebral disc displacement, lumbosacral region: Secondary | ICD-10-CM | POA: Diagnosis not present

## 2015-10-09 LAB — BASIC METABOLIC PANEL
Anion gap: 5 (ref 5–15)
BUN: 11 mg/dL (ref 6–20)
CO2: 27 mmol/L (ref 22–32)
Calcium: 8.9 mg/dL (ref 8.9–10.3)
Chloride: 105 mmol/L (ref 101–111)
Creatinine, Ser: 0.77 mg/dL (ref 0.61–1.24)
GFR calc Af Amer: >60 mL/min (ref >60)
GFR calc non Af Amer: >60 mL/min (ref >60)
Glucose, Bld: 136 mg/dL — ABNORMAL HIGH (ref 65–99)
Potassium: 3.6 mmol/L (ref 3.5–5.1)
Sodium: 137 mmol/L (ref 135–145)

## 2015-10-09 LAB — CBC
HCT: 37 % — ABNORMAL LOW (ref 39.0–52.0)
Hemoglobin: 12.4 g/dL — ABNORMAL LOW (ref 13.0–17.0)
MCH: 32.5 pg (ref 26.0–34.0)
MCHC: 33.5 g/dL (ref 30.0–36.0)
MCV: 96.9 fL (ref 78.0–100.0)
Platelets: 251 10*3/uL (ref 150–400)
RBC: 3.82 MIL/uL — ABNORMAL LOW (ref 4.22–5.81)
RDW: 13.9 % (ref 11.5–15.5)
WBC: 9.7 10*3/uL (ref 4.0–10.5)

## 2015-10-09 NOTE — Progress Notes (Signed)
Occupational Therapy Evaluation Patient Details Name: Jeremy Waters MRN: [id number] DOB: 1945/10/20 Today's Date: 10/09/2015    History of Present Illness Pt s/p L4-5, L4- S1 decompression.  Pt with hx of back surgery x 2 and L TKR (15)   Clinical Impression   All education completed and pt questions answered. No further OT needs. Will sign off.   Follow Up Recommendations  No OT follow up;Supervision - Intermittent    Equipment Recommendations  None recommended by OT    Recommendations for Other Services       Precautions / Restrictions Precautions Precautions: Back Precaution Booklet Issued: Yes (comment) Precaution Comments: reviewed all precautions and implications for ADLs Restrictions Weight Bearing Restrictions: No      Mobility Bed Mobility              General bed mobility comments: up in chair  Transfers Overall transfer level: Needs assistance Equipment used: None Transfers: Sit to/from Stand Sit to Stand: Supervision             Balance Overall balance assessment: No apparent balance deficits (not formally assessed)                                          ADL Overall ADL's : Needs assistance/impaired Eating/Feeding: Independent;Sitting   Grooming: Set up;Sitting;Supervision/safety;Standing   Upper Body Bathing: Set up;Sitting   Lower Body Bathing: Minimal assistance;Sit to/from stand   Upper Body Dressing : Set up;Sitting   Lower Body Dressing: Minimal assistance;Sit to/from stand   Toilet Transfer: Supervision/safety;Ambulation;Comfort height toilet   Toileting- Clothing Manipulation and Hygiene: Supervision/safety;Sit to/from stand   Tub/ Shower Transfer: Walk-in shower;Supervision/safety;Ambulation   Functional mobility during ADLs: Supervision/safety General ADL Comments: Discussed availability of reacher/other AE, 3 in 1, and use of 3 in 1 as shower chair if needed. Patient prefers to wait and see if  his bathroom setup will work at home. Educated pt and wife on where to obtain AE/DME if needed.     Vision     Perception     Praxis      Pertinent Vitals/Pain Pain Assessment: No/denies pain     Hand Dominance     Extremity/Trunk Assessment Upper Extremity Assessment Upper Extremity Assessment: Overall WFL for tasks assessed   Lower Extremity Assessment Lower Extremity Assessment: Defer to PT evaluation    Cervical / Trunk Assessment Cervical / Trunk Assessment: Kyphotic   Communication Communication Communication: No difficulties   Cognition Arousal/Alertness: Awake/alert Behavior During Therapy: WFL for tasks assessed/performed Overall Cognitive Status: Within Functional Limits for tasks assessed                     General Comments       Exercises       Shoulder Instructions      Home Living Family/patient expects to be discharged to:: Private residence Living Arrangements: Spouse/significant other Available Help at Discharge: Family Type of Home: House Home Access: Stairs to enter Technical brewer of Steps: 7 Entrance Stairs-Rails: Right Home Layout: Two level Alternate Level Stairs-Number of Steps: 14 Alternate Level Stairs-Rails: Right Bathroom Shower/Tub: Occupational psychologist: Handicapped height     Home Equipment: Cane - single point   Additional Comments: also has long handled shoe horn      Prior Functioning/Environment Level of Independence: Independent        Comments: Very  active    OT Diagnosis: Generalized weakness   OT Problem List: Decreased knowledge of precautions;Decreased knowledge of use of DME or AE   OT Treatment/Interventions:      OT Goals(Current goals can be found in the care plan section) Acute Rehab OT Goals Patient Stated Goal: Regain IND and resume active lifestyle without re-injuring back OT Goal Formulation: All assessment and education complete, DC therapy  OT Frequency:      Barriers to D/C:            Co-evaluation              End of Session Nurse Communication: Mobility status  Activity Tolerance: Patient tolerated treatment well Patient left: in chair;with call bell/phone within reach;with family/visitor present   Time: [id number] OT Time Calculation (min): 17 min Charges:  OT General Charges $OT Visit: 1 Procedure OT Evaluation $OT Eval Low Complexity: 1 Procedure G-Codes: OT G-codes **NOT FOR INPATIENT CLASS** Functional Assessment Tool Used: clinical judgment Functional Limitation: Self care Self Care Current Status [id number]): At least 1 percent but less than 20 percent impaired, limited or restricted Self Care Goal Status [id number]): At least 1 percent but less than 20 percent impaired, limited or restricted Self Care Discharge Status [phone]): At least 1 percent but less than 20 percent impaired, limited or restricted  Roisin Mones A 10/09/2015, 9:26 AM

## 2015-10-09 NOTE — Progress Notes (Signed)
RN reviewed discharge education with patient and family. All questions answered.   Paperwork and prescriptions given.   NT rolled patient down with all belongings to family car.

## 2015-10-09 NOTE — Op Note (Signed)
Jeremy Waters, Jeremy Waters               ACCOUNT NO.:  [id number]  MEDICAL RECORD NO.:  [id number]  LOCATION:  42                         FACILITY:  [facility]  PHYSICIAN:  Susa Day, M.D.    DATE OF BIRTH:  04/10/1946  DATE OF PROCEDURE:  10/08/2015 DATE OF DISCHARGE:                              OPERATIVE REPORT   PREOPERATIVE DIAGNOSIS:  Footdrop, right lower extremity radicular pain secondary to herniated nucleus pulposus, spinal stenosis, recurrent disk herniation, L5-S1 and stenosis 4-5.  POSTOPERATIVE DIAGNOSIS:  Footdrop, right lower extremity radicular pain secondary to herniated nucleus pulposus, spinal stenosis.  PROCEDURE: 1. Revision microlumbar decompression L5-S1, right. 2. Foraminotomies L5-S1, right. 3. Decompression, L4-5, L5-S1, right. 4. Microdiskectomy L5-S1, right.  ANESTHESIA:  General.  ASSISTANT:  Lacie Draft, PA  HISTORY:  This is a pleasant 70 year old gentleman who presented with kind referral of Dr. Roxy Manns.  He has had a history of lumbar decompression by Dr. Kym Groom at L4-5 and L5-S1 relatively well following that.  He however, 4-5 weeks ago developed right lower extremity radicular pain, subsequent numbness.  He had then developed numbness and a footdrop with an urgent MRI indicating extruded fragment at 5-1, compressing the S1-S2 nerve roots into the foramen of 5.  He had myotomal weakness, dermatomal dysesthesias, 3/5 dorsiflexion.  He had diminished sensation in the L5- S1 dermatome.  Failing conservative treatment, he was indicated for revision and decompression foraminotomies L5-S1, microdiskectomy.  It was predominantly the 5 nerve roots, we discussed identifying the 5 root, performing a foraminotomy of the decompression.  Risks and benefits were discussed including bleeding, infection, damage to neurovascular structures, no change in symptoms, worsening symptoms, DVT, PE, anesthetic complications, need for revision, etc.  TECHNIQUE:  Patient  in supine position, after induction of adequate general anesthesia, 2 g Kefzol, placed prone on the [place] frame.  All bony prominence were well padded.  Foley to gravity.  Lumbar region had been prepped and draped in usual sterile fashion.  Two 18-gauge spinal needle was utilized to localize the 4-5, 5-1 interspace confirmed with x- ray.  Incision was made from the spinous process of S1 to __________. Subcutaneous tissue was dissected.  Electrocautery was utilized to achieve hemostasis.  Dorsolumbar fascia identified, divided in line with skin incision after infiltration with 0.25% Marcaine with epinephrine. Digitally lysed an extensive area of paraspinous fibrosis.  Identifying the previous hemilaminotomy defects at 5-1 on the right, there was facet hypertrophy.  Had shingling of the S1 vertebral body with an increased lumbosacral angle, confirmed the disk space.  I then skeletonized the previous hemilaminotomy with a straight micro curette and an 18 straight curette.  Operating microscope was then draped and brought into the surgical field.  I used a curette to develop a plane between the facet and the thecal sac and epidural fibrosis.  This was further developed with __________ micro curette.  I then utilized micro osteotomes to remove a portion of the facet at the L5-S1, approximately 50% of the facet was removed, first the caudal process of L5 which was then removed preserving the pars.  A hypertrophic facet was noted with a fairly large superior articulating process of S1 with the increased lumbosacral  angle was projecting into the 5 foramen compressing the 5 root.  __________ we developed a plane between the lateral recess of the S1 nerve root and then with a 2 mm Kerrison, __________ removed the 50% of the superior articulating process of S1 to the pedicle of S1.  We then continued cephalad.  Identifying the pedicle of S1.  Then, performed hemilaminotomy __________ caudad edge of  5, completing the hemilaminectomy of 5 cephalad to remove residual hypertrophic facet and ligamentum.  After I performed a generous foraminotomy of the L5 and that superior articulating process of S1, we turned to the disk space of L5-S1 again increased lumbosacral angle what was noted.  I gently mobilized the S1 nerve root medially and noted that was of limited mobility.  We then continued cephalad to further mobilize the L5 nerve root.  As the free fragment was not immediately noticeable in the axilla of the 5 root.  I therefore completed hemilaminectomy of 5. Decompressed the entire foramen of 5 to identify the shoulder of the 5 root into the 4-5.  There was some residual ligamentum flavum and osteophytic spurring here adding to the compression.  Following obtaining the cephalad of the pedicle of 5, fully identified the shoulder of the 5 root, the axilla, and out into the foramen.  We then re-explored the area where the free fragment was noted just at the disk space and caudal to that.  There was an osteophytic ridge, right at the endplate of S1.  After protecting the 5 root and axilla gently, I used a nerve hook and gently swept underneath the 5 root and S1 nerve root and retrieved 2 small fragments.  Then, used a __________ disk space.  We had confirmed the disk space at 5-1.  We then used multiple passes with micropituitary to remove small spur from the disk space.  I further mobilized the S1 nerve root, L5 nerve root, and thecal sac.  We explored this to the midline gently again osteophytic ridge, small free fragment was noted caudal to displace consistent with that seen on the MRI though.  Further mobilized the disk space with an Epstein and then irrigated with catheter irrigation and then removed additional small fragments.  Just prior to that, we performed a generous foraminotomy of S1, fully identifying the S1 nerve root.  Again, mobilizing the S1 nerve root __________  identifying the disk space, and multiple passes beneath thecal sac, the __________ 5 and S1 nerve roots.  No residual fragments were noted.  Again above the root of 5, we evaluated the disk space. There were no fragments noted at the shoulder of the root noted cephalad to that.  A neural probe passed freely up the foramen of 5 and S1. There was a 1 cm __________ of the S1 nerve root __________ pedicle.  No active bleeding or CSF leakage.  Again there was increased mobility at the S1 nerve root.  Following the decompression and the diskectomy felt again the pathology, it was particularly noted due to the facet hypertrophy, the increased lumbosacral angle from the superior articulating process of the S1 compressing the 5 root into the foramen. I again copiously irrigated the wound.  No evidence of CSF leakage or active bleeding.  Then after bipolar cautery had been utilized to achieve hemostasis, copious irrigation, removed McCulloch retractor. Copiously irrigated the paraspinous musculature.  No active bleeding. Again just prior to that, had checked distal to the disk space down the vertebral body of S1, again no  further free fragments or neurocompressive lesions were noted.  I closed the dorsolumbar fascia with 1 Vicryl, subcu with 2-0, and skin with staples.  Wound was dressed sterilely, placed supine on the hospital bed, extubated without difficulty, and transported to recovery room in satisfactory condition.  The patient tolerated the procedure well.  No complications.  Assistant, Lacie Draft, [place], served throughout the case for gentle intermittent neural traction, patient positioning, suction and closure.     Susa Day, M.D.     Geralynn Rile  D:  10/08/2015  T:  10/08/2015  Job:  [id number]

## 2015-10-09 NOTE — Discharge Summary (Signed)
Physician Discharge Summary   Patient ID: Jeremy Waters MRN: [id number] DOB/AGE: 10-01-1945 70 y.o.  Admit date: 10/08/2015 Discharge date: 10/09/2015  Primary Diagnosis:   HNP STENOSIS L5-S1  Admission Diagnoses:  Past Medical History  Diagnosis Date  . Diverticulosis of colon     w/o hemorrage  . Hyperlipidemia   . Prostate cancer ([place])   . Colon polyp   . Inguinal hernia     left side   . Heart murmur     TOLD HE HAS A SLIGHT MURMUR  . Arthritis     OA / PAIN LEFT KNEE   Discharge Diagnoses:   Active Problems:   Spinal stenosis, lumbar  Procedure:  Procedure(s) (LRB): MICRO LUMBAR DECOMPRESSION L4-L5 AND L5-S1 ON RIGHT  (Right)   Consults: None  HPI:  see H&P    Laboratory Data: Hospital Outpatient Visit on 10/03/2015  Component Date Value Ref Range Status  . MRSA, PCR 10/03/2015 NEGATIVE  NEGATIVE Final  . Staphylococcus aureus 10/03/2015 NEGATIVE  NEGATIVE Final   Comment:        The Xpert SA Assay (FDA approved for NASAL specimens in patients over 84 years of age), is one component of a comprehensive surveillance program.  Test performance has been validated by [facility] for patients greater than or equal to 33 year old. It is not intended to diagnose infection nor to guide or monitor treatment.   . Sodium 10/03/2015 143  135 - 145 mmol/L Final  . Potassium 10/03/2015 4.0  3.5 - 5.1 mmol/L Final  . Chloride 10/03/2015 107  101 - 111 mmol/L Final  . CO2 10/03/2015 25  22 - 32 mmol/L Final  . Glucose, Bld 10/03/2015 84  65 - 99 mg/dL Final  . BUN 10/03/2015 20  6 - 20 mg/dL Final  . Creatinine, Ser 10/03/2015 0.72  0.61 - 1.24 mg/dL Final  . Calcium 10/03/2015 9.5  8.9 - 10.3 mg/dL Final  . GFR calc non Af Amer 10/03/2015 >60  >60 mL/min Final  . GFR calc Af Amer 10/03/2015 >60  >60 mL/min Final   Comment: (NOTE) The eGFR has been calculated using the CKD EPI equation. This calculation has not been validated in all clinical  situations. eGFR's persistently <60 mL/min signify possible Chronic Kidney Disease.   . Anion gap 10/03/2015 11  5 - 15 Final  . WBC 10/03/2015 4.6  4.0 - 10.5 K/uL Final  . RBC 10/03/2015 4.22  4.22 - 5.81 MIL/uL Final  . Hemoglobin 10/03/2015 13.5  13.0 - 17.0 g/dL Final  . HCT 10/03/2015 40.7  39.0 - 52.0 % Final  . MCV 10/03/2015 96.4  78.0 - 100.0 fL Final  . MCH 10/03/2015 32.0  26.0 - 34.0 pg Final  . MCHC 10/03/2015 33.2  30.0 - 36.0 g/dL Final  . RDW 10/03/2015 13.9  11.5 - 15.5 % Final  . Platelets 10/03/2015 269  150 - 400 K/uL Final  . ABO/RH(D) 10/03/2015 A POS   Final  . Antibody Screen 10/03/2015 NEG   Final  . Sample Expiration 10/03/2015 10/11/2015   Final  . Extend sample reason 10/03/2015 NO TRANSFUSIONS OR PREGNANCY IN THE PAST 3 MONTHS   Final    Recent Labs  10/09/15 0400  HGB 12.4*    Recent Labs  10/09/15 0400  WBC 9.7  RBC 3.82*  HCT 37.0*  PLT 251    Recent Labs  10/09/15 0400  NA 137  K 3.6  CL 105  CO2 27  BUN 11  CREATININE 0.77  GLUCOSE 136*  CALCIUM 8.9   No results for input(s): LABPT, INR in the last 72 hours.  X-Rays:Dg Lumbar Spine 2-3 Views  10/03/2015  CLINICAL DATA:  Herniated  disc, preop for lumbar surgery EXAM: LUMBAR SPINE - 2-3 VIEW COMPARISON:  09/09/2015 FINDINGS: Two views of the lumbar spine submitted. Mild levoscoliosis. There is mild disc space flattening with mild anterior spurring at T12-L1 level. Minimal anterior spurring and disc space flattening at L1-L2 level. Mild disc space flattening at L3-L4 level. Mild disc space flattening with anterior spurring at L4-L5 level. Mild to moderate disc space flattening with anterior spurring at L5-S1 level. IMPRESSION: No acute fracture or subluxation. Mild levoscoliosis. Degenerative changes as described above. Electronically Signed   By: Lahoma Crocker M.D.   On: 10/03/2015 16:09   Mr Lumbar Spine Wo Contrast  09/09/2015  CLINICAL DATA:  70 year old male with 1 week of lumbar  back pain and right leg numbness. Initial encounter. Personal history of surgery in 2000 and 2005. EXAM: MRI LUMBAR SPINE WITHOUT CONTRAST TECHNIQUE: Multiplanar, multisequence MR imaging of the lumbar spine was performed. No intravenous contrast was administered. COMPARISON:  CT Abdomen and Pelvis 04/04/2014. Lumbar MRI 09/27/2003. FINDINGS: Normal lumbar segmentation demonstrated in 2015 and this is the same numbering system on the 2005 MRI. Overall stable vertebral height and alignment since 2005. Mild retrolisthesis at L1-L2 re- demonstrated. Chronic endplate degeneration has progressed at multiple levels, and is most pronounced at T12-L1 where a developing Schmorl's node is suspected. Multilevel mild degenerative appearing endplate marrow edema. No suspicious intervertebral disc signal or evidence of paraspinal soft tissue inflammation. Visible sacrum intact. Visualized lower thoracic spinal cord is normal with conus medularis at L1-L2. Stable visualized abdominal viscera, including bilateral benign renal parapelvic cysts. Postoperative changes to the posterior paraspinal soft tissues at the L4 and L5 levels. T11-T12:  Negative. T12-L1: Interval disc desiccation and disc space loss with circumferential disc osteophyte complex. Mild facet hypertrophy. No significant stenosis. L1-L2: Chronic disc desiccation. Mild circumferential disc bulge and facet hypertrophy are mildly progressed. There is now mild spinal and bilateral L1 foraminal stenosis at this level. L2-L3: Interval disc desiccation. Disc space loss with right eccentric circumferential disc bulge. Mild to moderate facet hypertrophy. Borderline to mild left lateral recess stenosis is new. L3-L4: Interval disc desiccation with left eccentric circumferential disc bulge. Mild facet hypertrophy. Mild left L3 foraminal stenosis is new. L4-L5: Previous decompressive laminectomy here. Chronic disc desiccation. Chronic circumferential disc bulge with new  superimposed left paracentral to subarticular slightly caudal disc extrusion as seen on series 5, image 7 and series 8, image 24. Moderate to severe left lateral recess stenosis results. No spinal stenosis. Mild bilateral L4 foraminal stenosis appears mostly related to endplate spurring. L5-S1: Chronic disc desiccation and disc space loss. Chronic right eccentric circumferential disc bulge with new or significantly increased right lateral recess disc herniation with caudal sequestered disc fragment on series 8, image 31 measuring up to 12 mm. Increased mild to moderate facet hypertrophy greater on the left. Severe right lateral recess stenosis and involvement of the descending right S1 and S2 nerve roots. No significant spinal stenosis. Moderate to severe right greater than left bilateral L5 foraminal stenosis also has progressed. IMPRESSION: 1. Symptomatic level favored to be L5-S1 were there is a moderate to large right lateral recess disc extrusion with evidence of a 12 mm sequestered disc fragment (series 8, image 31) severely affecting the descending right S1 and S2 nerve  roots in the lateral recess. This also contributes to severe right L5 foraminal stenosis which has progressed since 2005. 2. Postoperative changes at L4-L5 with progressed chronic disc herniation most affecting the left lateral recess at the level of the descending left L5 nerve roots. 3. Progressed lumbar spine degeneration elsewhere since 2005 with new multifactorial mild spinal stenosis at L1-L2, left lateral recess stenosis at L2-L3, and left foraminal stenosis at L3-L4. Electronically Signed   By: Genevie Ann M.D.   On: 09/09/2015 20:02   Dg Spine Portable 1 View  10/08/2015  CLINICAL DATA:  Surgical level L5-S1. Please number intervertebral disc spaces. EXAM: PORTABLE SPINE - 1 VIEW COMPARISON:  Films earlier today. FINDINGS: Spinal numbering as on preoperative MRI. The L5-S1 disc space is labeled as requested. A probe overlaps the  posterior L5-S1 interspace. IMPRESSION: Intraoperative localization as described. Electronically Signed   By: Monte Fantasia M.D.   On: 10/08/2015 10:25   Dg Spine Portable 1 View  10/08/2015  CLINICAL DATA:  Surgical level L5-S1. EXAM: PORTABLE SPINE - 1 VIEW COMPARISON:  Earlier today.  Lumbar spine MRI 09/09/2015. FINDINGS: Spinal numbering as on preoperative MRI. The image has been labeled as requested. A retractor and probe overlaps the posterior elements of S1. Changes of laminectomy at L4. IMPRESSION: Retractor and probe at the S1 level. Electronically Signed   By: Monte Fantasia M.D.   On: 10/08/2015 09:21   Dg Spine Portable 1 View  10/08/2015  CLINICAL DATA:  Lumbar surgery, evaluate localization EXAM: PORTABLE SPINE - 1 VIEW COMPARISON:  10/03/2015 FINDINGS: Needles are noted in the posterior soft tissues at the level of L4 and S1. There are previous surgical changes at L4 and L5. IMPRESSION: Intraoperative localization as described. Electronically Signed   By: [name] Catalina M.D.   On: 10/08/2015 09:07    EKG: Orders placed or performed in visit on 10/03/15  . EKG 12-Lead     Hospital Course: Patient was admitted to [facility] and taken to the OR and underwent the above state procedure without complications.  Patient tolerated the procedure well and was later transferred to the recovery room and then to the orthopaedic floor for postoperative care.  They were given PO and IV analgesics for pain control following their surgery.  They were given 24 hours of postoperative antibiotics.   PT was consulted postop to assist with mobility and transfers.  The patient was allowed to be WBAT with therapy and was taught back precautions. Discharge planning was consulted to help with postop disposition and equipment needs.  Patient had a good night on the evening of surgery and started to get up OOB with therapy on day one. Patient was seen in rounds and was ready to go home on day one.  They  were given discharge instructions and dressing directions.  They were instructed on when to follow up in the office with Dr. Tonita Cong.   Diet: Regular diet Activity:WBAT; Lspine precautions Follow-up:in 10-14 days Disposition - Home Discharged Condition: good   Discharge Instructions    Call MD / Call 911    Complete by:  As directed   If you experience chest pain or shortness of breath, CALL 911 and be transported to the hospital emergency room.  If you develope a fever above 101 F, pus (white drainage) or increased drainage or redness at the wound, or calf pain, call your surgeon's office.     Constipation Prevention    Complete by:  As directed  Drink plenty of fluids.  Prune juice may be helpful.  You may use a stool softener, such as Colace (over the counter) 100 mg twice a day.  Use MiraLax (over the counter) for constipation as needed.     Diet - low sodium heart healthy    Complete by:  As directed      Increase activity slowly as tolerated    Complete by:  As directed             Medication List    STOP taking these medications        beta carotene 30 MG capsule     vitamin E 400 UNIT capsule      TAKE these medications        Albuterol Sulfate 108 (90 Base) MCG/ACT Aepb  Commonly known as:  PROAIR RESPICLICK  Inhale 2 puffs into the lungs every 6 (six) hours as needed.     clonazePAM 0.5 MG tablet  Commonly known as:  KLONOPIN  TAKE ONE TABLET AT BEDTIME AS NEEDED     docusate sodium 100 MG capsule  Commonly known as:  COLACE  Take 1 capsule (100 mg total) by mouth 2 (two) times daily.     fluticasone 50 MCG/ACT nasal spray  Commonly known as:  FLONASE  PLACE 1 SPRAY INTO BOTH NOSTRILS DAILY.     HYDROcodone-acetaminophen 5-325 MG tablet  Commonly known as:  NORCO  Take 1-2 tablets by mouth every 6 (six) hours as needed for moderate pain.     levothyroxine 25 MCG tablet  Commonly known as:  SYNTHROID, LEVOTHROID  Take 1 tablet (25 mcg total) by mouth  daily before breakfast.     simvastatin 20 MG tablet  Commonly known as:  ZOCOR  TAKE 1 TABLET (20 MG TOTAL) BY MOUTH AT BEDTIME.     Sulfacetamide Sodium-Sulfur 9.8-4.8 % Liqd  Apply 1 application topically 2 (two) times daily as needed. Applies to face.     vitamin C 500 MG tablet  Commonly known as:  ASCORBIC ACID  Take 500 mg by mouth 2 (two) times daily.           Follow-up Information    Follow up with BEANE,JEFFREY C, MD In 2 weeks.   Specialty:  Orthopedic Surgery   Why:  For suture removal   Contact information:   [address] [place] [address] [phone]       Signed: Lacie Draft, PA-C Orthopaedic Surgery 10/09/2015, 9:30 AM

## 2015-10-09 NOTE — Evaluation (Signed)
Physical Therapy Evaluation Patient Details Name: Jeremy Waters MRN: [id number] DOB: 06-02-1945 Today's Date: 10/09/2015   History of Present Illness  Pt s/p L4-5, L4- S1 decompression.  Pt with hx of back surgery x 2 and L TKR (15)  Clinical Impression  Pt s/p back surgery and presenting with functional mobility limitations 2* post op back precautions.  Pt very motivated and educated on back precautions with written information provided.  Pt plans dc home this date with family assist at home.    Follow Up Recommendations No PT follow up    Equipment Recommendations  None recommended by PT    Recommendations for Other Services OT consult     Precautions / Restrictions Precautions Precautions: Back Precaution Booklet Issued: Yes (comment) Precaution Comments: Reviewed x 2 Restrictions Weight Bearing Restrictions: No      Mobility  Bed Mobility Overal bed mobility: Modified Independent             General bed mobility comments: Pt self cueing for correct log roll  Transfers Overall transfer level: Needs assistance Equipment used: None Transfers: Sit to/from Stand Sit to Stand: Supervision         General transfer comment: cues for transition position, adherence to back precautions and use of UEs to self assist  Ambulation/Gait Ambulation/Gait assistance: Independent Ambulation Distance (Feet): 600 Feet Assistive device: None Gait Pattern/deviations: Step-through pattern;WFL(Within Functional Limits);Wide base of support Gait velocity: cues to slow pace   General Gait Details: cues to slow pace  Stairs Stairs: Yes Stairs assistance: Supervision Stair Management: One rail Right;Alternating pattern Number of Stairs: 4 General stair comments: cues to slow pace and limit frequency of stairs at home as able  Wheelchair Mobility    Modified Rankin (Stroke Patients Only)       Balance Overall balance assessment: No apparent balance deficits (not  formally assessed)                                           Pertinent Vitals/Pain Pain Assessment: No/denies pain    Home Living Family/patient expects to be discharged to:: Private residence Living Arrangements: Spouse/significant other Available Help at Discharge: Family Type of Home: House Home Access: Stairs to enter Entrance Stairs-Rails: Right Entrance Stairs-Number of Steps: 7 Home Layout: Two level Home Equipment: Cane - single point      Prior Function Level of Independence: Independent         Comments: Very active     Hand Dominance        Extremity/Trunk Assessment   Upper Extremity Assessment: Overall WFL for tasks assessed           Lower Extremity Assessment: RLE deficits/detail RLE Deficits / Details: 3+/5 Great toe ext/ DF    Cervical / Trunk Assessment: Kyphotic  Communication   Communication: No difficulties  Cognition Arousal/Alertness: Awake/alert Behavior During Therapy: WFL for tasks assessed/performed Overall Cognitive Status: Within Functional Limits for tasks assessed                      General Comments      Exercises        Assessment/Plan    PT Assessment Patent does not need any further PT services  PT Diagnosis Difficulty walking   PT Problem List    PT Treatment Interventions     PT Goals (Current goals can be found  in the Care Plan section) Acute Rehab PT Goals Patient Stated Goal: Regain IND and resume active lifestyle without re-injuring back PT Goal Formulation: All assessment and education complete, DC therapy    Frequency     Barriers to discharge        Co-evaluation               End of Session   Activity Tolerance: Patient tolerated treatment well Patient left: in chair;with call bell/phone within reach Nurse Communication: Mobility status    Functional Assessment Tool Used: Clinical judgement Functional Limitation: Mobility: Walking and moving  around Mobility: Walking and Moving Around Current Status [id number]): At least 1 percent but less than 20 percent impaired, limited or restricted Mobility: Walking and Moving Around Goal Status [phone]): At least 1 percent but less than 20 percent impaired, limited or restricted Mobility: Walking and Moving Around Discharge Status [phone]): At least 1 percent but less than 20 percent impaired, limited or restricted    Time: 0822-0850 PT Time Calculation (min) (ACUTE ONLY): 28 min   Charges:   PT Evaluation $PT Eval Low Complexity: 1 Procedure PT Treatments $Gait Training: 8-22 mins   PT G Codes:   PT G-Codes **NOT FOR INPATIENT CLASS** Functional Assessment Tool Used: Clinical judgement Functional Limitation: Mobility: Walking and moving around Mobility: Walking and Moving Around Current Status [id number]): At least 1 percent but less than 20 percent impaired, limited or restricted Mobility: Walking and Moving Around Goal Status [phone]): At least 1 percent but less than 20 percent impaired, limited or restricted Mobility: Walking and Moving Around Discharge Status [phone]): At least 1 percent but less than 20 percent impaired, limited or restricted    [facility] 10/09/2015, 8:54 AM

## 2015-10-09 NOTE — Telephone Encounter (Signed)
Pt was on TCM list admitted for HNP Stenosis L5-S1 had a LRB: Micro Lumbar Decompression L4-L5 and L5-S1 on (R) side. D/C 5/18, and will be f/u w/ Dr. Dellis Filbert in 2 weeks...[name]

## 2015-10-09 NOTE — Progress Notes (Signed)
Subjective: 1 Day Post-Op Procedure(s) (LRB): MICRO LUMBAR DECOMPRESSION L4-L5 AND L5-S1 ON RIGHT  (Right) Patient reports pain as mild.   Minimal pain. Strength improved. Some numbness in R leg. Feels ready to go home. Seen by Dr. Tonita Cong  Objective: Vital signs in last 24 hours: Temp:  [97.4 F (36.3 C)-98.5 F (36.9 C)] 98.3 F (36.8 C) (05/18 0644) Pulse Rate:  [61-90] 61 (05/18 0644) Resp:  [8-21] 16 (05/18 0644) BP: (104-138)/(56-96) 124/64 mmHg (05/18 0644) SpO2:  [95 %-100 %] 98 % (05/18 0644)  Intake/Output from previous day: 05/17 0701 - 05/18 0700 In: 2875.8 [P.O.:840; I.V.:2035.8] Out: [id number] [Urine:3525; Blood:100] Intake/Output this shift: Total I/O In: 315 [P.O.:240; I.V.:75] Out: 100 [Urine:100]   Recent Labs  10/09/15 0400  HGB 12.4*    Recent Labs  10/09/15 0400  WBC 9.7  RBC 3.82*  HCT 37.0*  PLT 251    Recent Labs  10/09/15 0400  NA 137  K 3.6  CL 105  CO2 27  BUN 11  CREATININE 0.77  GLUCOSE 136*  CALCIUM 8.9   No results for input(s): LABPT, INR in the last 72 hours.  Neurologically intact ABD soft Neurovascular intact Sensation intact distally Intact pulses distally Dorsiflexion/Plantar flexion intact Incision: dressing C/D/I and no drainage No cellulitis present Compartment soft no sign of DVT  Strength with DF improved  Assessment/Plan: 1 Day Post-Op Procedure(s) (LRB): MICRO LUMBAR DECOMPRESSION L4-L5 AND L5-S1 ON RIGHT  (Right) Advance diet Up with therapy D/C IV fluids  Discussed D/C instructions, Lspine precautions, dressing instructions D/C home today  Maron Stanzione M. 10/09/2015, 9:26 AM

## 2015-10-15 ENCOUNTER — Encounter: Payer: Self-pay | Admitting: Gastroenterology

## 2015-11-03 ENCOUNTER — Encounter: Payer: Self-pay | Admitting: Gastroenterology

## 2015-12-02 ENCOUNTER — Other Ambulatory Visit: Payer: Self-pay | Admitting: *Deleted

## 2015-12-02 MED ORDER — SIMVASTATIN 20 MG PO TABS: ORAL_TABLET | ORAL | Status: DC

## 2015-12-02 MED ORDER — LEVOTHYROXINE SODIUM 25 MCG PO TABS: 25.0000 ug | ORAL_TABLET | Freq: Every day | ORAL | Status: DC

## 2015-12-02 NOTE — Telephone Encounter (Signed)
Left msg on triage stating pt is needing new rx sent to [place] [facility] mail service on his Levothyroxine & Simvastatin. Sent rx electronically...Jeremy Waters

## 2015-12-26 ENCOUNTER — Ambulatory Visit (AMBULATORY_SURGERY_CENTER): Payer: Self-pay

## 2015-12-26 ENCOUNTER — Telehealth: Payer: Self-pay | Admitting: Gastroenterology

## 2015-12-26 VITALS — Ht 67.5 in | Wt 148.0 lb

## 2015-12-26 DIAGNOSIS — Z86010 Personal history of colonic polyps: Secondary | ICD-10-CM

## 2015-12-26 MED ORDER — NA SULFATE-K SULFATE-MG SULF 17.5-3.13-1.6 GM/177ML PO SOLN: 1.0000 | Freq: Once | ORAL | 0 refills | Status: AC

## 2015-12-26 NOTE — Telephone Encounter (Signed)
Put on [employer] list for pt's awaiting Suprep Samples.                                                      Angela//PV

## 2015-12-26 NOTE — Progress Notes (Signed)
No egg or soy allergy.  No previous complications from anesthesia. No home O2. No diet meds. 

## 2016-01-05 ENCOUNTER — Telehealth: Payer: Self-pay | Admitting: Gastroenterology

## 2016-01-06 ENCOUNTER — Encounter: Payer: Self-pay | Admitting: Gastroenterology

## 2016-01-06 NOTE — Telephone Encounter (Signed)
Spoke with patient and told him I would call him next week about pickup up a suprep sample.  Patient agreed.

## 2016-01-08 ENCOUNTER — Encounter: Payer: Self-pay | Admitting: Gastroenterology

## 2016-01-08 ENCOUNTER — Telehealth: Payer: Self-pay | Admitting: Gastroenterology

## 2016-01-09 NOTE — Telephone Encounter (Signed)
Told patient that I was not sure why it was not shwoing up on my chart. Informed patient that I sent him a my chart message with the prep instructions in them any if he has any other questions then to call us back. Patient verbalized understanding.

## 2016-01-09 NOTE — Telephone Encounter (Signed)
Sent prep instructions through message in my chart to patient.

## 2016-01-09 NOTE — Telephone Encounter (Signed)
Left message for patient to return my call.

## 2016-01-13 ENCOUNTER — Telehealth: Payer: Self-pay

## 2016-01-13 NOTE — Telephone Encounter (Signed)
Left message that I was leaving a Suprep sample up front to be picked up

## 2016-01-20 ENCOUNTER — Encounter: Payer: Self-pay | Admitting: Gastroenterology

## 2016-01-20 ENCOUNTER — Telehealth: Payer: Self-pay | Admitting: Gastroenterology

## 2016-01-20 ENCOUNTER — Ambulatory Visit (AMBULATORY_SURGERY_CENTER): Payer: Medicare Other | Admitting: Gastroenterology

## 2016-01-20 VITALS — BP 114/66 | HR 50 | Temp 98.9°F | Resp 9 | Ht 67.5 in | Wt 148.0 lb

## 2016-01-20 DIAGNOSIS — Z86010 Personal history of colonic polyps: Secondary | ICD-10-CM

## 2016-01-20 DIAGNOSIS — D123 Benign neoplasm of transverse colon: Secondary | ICD-10-CM | POA: Diagnosis not present

## 2016-01-20 MED ORDER — SODIUM CHLORIDE 0.9 % IV SOLN: 500.0000 mL | INTRAVENOUS | Status: DC

## 2016-01-20 NOTE — Patient Instructions (Signed)
YOU HAD AN ENDOSCOPIC PROCEDURE TODAY: Refer to the procedure report and other information in the discharge instructions given to you for any specific questions about what was found during the examination. If this information does not answer your questions, please call [facility] office at [phone] to clarify.   YOU SHOULD EXPECT: Some feelings of bloating in the abdomen. Passage of more gas than usual. Walking can help get rid of the air that was put into your GI tract during the procedure and reduce the bloating. If you had a lower endoscopy (such as a colonoscopy or flexible sigmoidoscopy) you may notice spotting of blood in your stool or on the toilet paper. Some abdominal soreness may be present for a day or two, also.  DIET: Your first meal following the procedure should be a light meal and then it is ok to progress to your normal diet. A half-sandwich or bowl of soup is an example of a good first meal. Heavy or fried foods are harder to digest and may make you feel nauseous or bloated. Drink plenty of fluids but you should avoid alcoholic beverages for 24 hours. If you had a esophageal dilation, please see attached instructions for diet.    Please read all your handouts given to you by your recovery nurse today.  ACTIVITY: Your care partner should take you home directly after the procedure. You should plan to take it easy, moving slowly for the rest of the day. You can resume normal activity the day after the procedure however YOU SHOULD NOT DRIVE, use power tools, machinery or perform tasks that involve climbing or major physical exertion for 24 hours (because of the sedation medicines used during the test).   SYMPTOMS TO REPORT IMMEDIATELY: A gastroenterologist can be reached at any hour. Please call [phone]  for any of the following symptoms:  Following lower endoscopy (colonoscopy, flexible sigmoidoscopy) Excessive amounts of blood in the stool  Significant tenderness, worsening of  abdominal pains  Swelling of the abdomen that is new, acute  Fever of 100 or higher   FOLLOW UP:  If any biopsies were taken you will be contacted by phone or by letter within the next 1-3 weeks. Call [phone]  if you have not heard about the biopsies in 3 weeks.  Please also call with any specific questions about appointments or follow up tests.    Thank you for letting us take care of your heatlhcare needs today.

## 2016-01-20 NOTE — Telephone Encounter (Signed)
Spoke with pt and told him nasal drainage is common after receiving oxygen from his procedure.  Recommended using OTC saline nasal spray as needed.  Understanding voiced

## 2016-01-20 NOTE — Op Note (Signed)
Jeremy Waters: Jeremy Waters Procedure Date: 01/20/2016 8:36 AM MRN: [id number] Endoscopist: Ladene Artist , MD Age: 70 Referring MD:  Date of Birth: 1945-08-29 Gender: Male Account #: [id number] Procedure:                Colonoscopy Indications:              Surveillance: Personal history of adenomatous                            polyps on last colonoscopy > 3 years ago Medicines:                Monitored Anesthesia Care Procedure:                Pre-Anesthesia Assessment:                           - Prior to the procedure, a History and Physical                            was performed, and patient medications and                            allergies were reviewed. The patient's tolerance of                            previous anesthesia was also reviewed. The risks                            and benefits of the procedure and the sedation                            options and risks were discussed with the patient.                            All questions were answered, and informed consent                            was obtained. Prior Anticoagulants: The patient has                            taken no previous anticoagulant or antiplatelet                            agents. ASA Grade Assessment: II - A patient with                            mild systemic disease. After reviewing the risks                            and benefits, the patient was deemed in                            satisfactory condition to undergo the procedure.  After obtaining informed consent, the colonoscope                            was passed under direct vision. Throughout the                            procedure, the patient's blood pressure, pulse, and                            oxygen saturations were monitored continuously. The                            Model [id number]-[id number]) scope was introduced                            through the anus and  advanced to the the cecum,                            identified by appendiceal orifice and ileocecal                            valve. The ileocecal valve, appendiceal orifice,                            and rectum were photographed. The quality of the                            bowel preparation was good. The colonoscopy was                            performed without difficulty. The patient tolerated                            the procedure well. Scope In: 8:40:59 AM Scope Out: 8:55:35 AM Scope Withdrawal Time: 0 hours 10 minutes 19 seconds  Total Procedure Duration: 0 hours 14 minutes 36 seconds  Findings:                 A 6 mm polyp was found in the transverse colon. The                            polyp was sessile. The polyp was removed with a                            cold snare. Resection and retrieval were complete.                           Internal hemorrhoids were found during                            retroflexion. The hemorrhoids were medium-sized and                            Grade I (internal hemorrhoids that do not prolapse).  A few small-mouthed diverticula were found in the                            sigmoid colon.                           The entire examined colon appeared normal on direct                            and retroflexion views. Complications:            No immediate complications. Estimated blood loss:                            None. Estimated Blood Loss:     Estimated blood loss: none. Impression:               - One 6 mm polyp in the transverse colon, removed                            with a cold snare. Resected and retrieved.                           - Internal hemorrhoids.                           - Diverticulosis in the sigmoid colon.                           - The entire examined colon is normal on direct and                            retroflexion views. Recommendation:           - Repeat colonoscopy in 5 years  for surveillance.                           - Patient has a contact number available for                            emergencies. The signs and symptoms of potential                            delayed complications were discussed with the                            patient. Return to normal activities tomorrow.                            Written discharge instructions were provided to the                            patient.                           - High fiber diet.                           -  Continue present medications.                           - Await pathology results. Ladene Artist, MD 01/20/2016 8:58:18 AM This report has been signed electronically.

## 2016-01-20 NOTE — Progress Notes (Signed)
Patient awakening,vss,report to rn 

## 2016-01-21 ENCOUNTER — Telehealth: Payer: Self-pay

## 2016-01-21 NOTE — Telephone Encounter (Signed)
  Follow up Call-  Call back number 01/20/2016  Post procedure Call Back phone  # [phone]  Permission to leave phone message Yes  Some recent data might be hidden    Patient was called for follow up after his procedure on 01/20/2016. No answer at the number given for follow up phone call. A message was left on the answering machine.

## 2016-01-27 ENCOUNTER — Encounter: Payer: Self-pay | Admitting: Internal Medicine

## 2016-01-27 ENCOUNTER — Encounter: Payer: Self-pay | Admitting: Gastroenterology

## 2016-02-01 ENCOUNTER — Other Ambulatory Visit: Payer: Self-pay | Admitting: Internal Medicine

## 2016-02-02 NOTE — Telephone Encounter (Signed)
RX faxed to [facility] 

## 2016-02-03 NOTE — Telephone Encounter (Signed)
Pt would like someone to call him about his Colonoscopy results.  He could not view them on my chart

## 2016-02-09 ENCOUNTER — Ambulatory Visit (INDEPENDENT_AMBULATORY_CARE_PROVIDER_SITE_OTHER): Payer: Medicare Other

## 2016-02-09 DIAGNOSIS — Z23 Encounter for immunization: Secondary | ICD-10-CM | POA: Diagnosis not present

## 2016-05-31 DIAGNOSIS — M545 Low back pain: Secondary | ICD-10-CM | POA: Diagnosis not present

## 2016-06-16 DIAGNOSIS — M545 Low back pain: Secondary | ICD-10-CM | POA: Diagnosis not present

## 2016-07-01 DIAGNOSIS — M545 Low back pain: Secondary | ICD-10-CM | POA: Diagnosis not present

## 2016-07-21 DIAGNOSIS — M545 Low back pain: Secondary | ICD-10-CM | POA: Diagnosis not present

## 2016-08-09 DIAGNOSIS — M545 Low back pain: Secondary | ICD-10-CM | POA: Diagnosis not present

## 2016-08-13 ENCOUNTER — Encounter: Payer: Self-pay | Admitting: Internal Medicine

## 2016-08-13 MED ORDER — LEVOTHYROXINE SODIUM 25 MCG PO TABS: 25.0000 ug | ORAL_TABLET | Freq: Every day | ORAL | 0 refills | Status: DC

## 2016-08-13 MED ORDER — SIMVASTATIN 20 MG PO TABS: ORAL_TABLET | ORAL | 0 refills | Status: DC

## 2016-08-27 ENCOUNTER — Other Ambulatory Visit: Payer: Self-pay | Admitting: Internal Medicine

## 2016-09-01 DIAGNOSIS — M545 Low back pain: Secondary | ICD-10-CM | POA: Diagnosis not present

## 2016-09-15 ENCOUNTER — Encounter: Payer: Self-pay | Admitting: Internal Medicine

## 2016-09-15 ENCOUNTER — Ambulatory Visit (INDEPENDENT_AMBULATORY_CARE_PROVIDER_SITE_OTHER): Payer: PPO | Admitting: Internal Medicine

## 2016-09-15 VITALS — BP 110/64 | HR 57 | Temp 98.8°F | Resp 16 | Ht 67.5 in | Wt 147.0 lb

## 2016-09-15 DIAGNOSIS — E7800 Pure hypercholesterolemia, unspecified: Secondary | ICD-10-CM

## 2016-09-15 DIAGNOSIS — Z0000 Encounter for general adult medical examination without abnormal findings: Secondary | ICD-10-CM | POA: Diagnosis not present

## 2016-09-15 DIAGNOSIS — R739 Hyperglycemia, unspecified: Secondary | ICD-10-CM

## 2016-09-15 DIAGNOSIS — M545 Low back pain: Secondary | ICD-10-CM | POA: Diagnosis not present

## 2016-09-15 DIAGNOSIS — Z8546 Personal history of malignant neoplasm of prostate: Secondary | ICD-10-CM | POA: Diagnosis not present

## 2016-09-15 DIAGNOSIS — E039 Hypothyroidism, unspecified: Secondary | ICD-10-CM

## 2016-09-15 DIAGNOSIS — I779 Disorder of arteries and arterioles, unspecified: Secondary | ICD-10-CM

## 2016-09-15 DIAGNOSIS — I739 Peripheral vascular disease, unspecified: Secondary | ICD-10-CM

## 2016-09-15 MED ORDER — LEVOTHYROXINE SODIUM 25 MCG PO TABS: 25.0000 ug | ORAL_TABLET | Freq: Every day | ORAL | 3 refills | Status: DC

## 2016-09-15 MED ORDER — SIMVASTATIN 20 MG PO TABS: ORAL_TABLET | ORAL | 3 refills | Status: DC

## 2016-09-15 NOTE — Patient Instructions (Addendum)
Jeremy Waters , Thank you for taking time to come for your Medicare Wellness Visit. I appreciate your ongoing commitment to your health goals. Please review the following plan we discussed and let me know if I can assist you in the future.   These are the goals we discussed: Goals    None      This is a list of the screening recommended for you and due dates:  Health Maintenance  Topic Date Due  . Flu Shot  12/22/2016  . Tetanus Vaccine  05/24/2018  . Colon Cancer Screening  01/19/2021  .  Hepatitis C: One time screening is recommended by Center for Disease Control  (CDC) for  adults born from [birth year] through 1964.   Completed  . Pneumonia vaccines  Completed   Test(s) ordered today. Your results will be released to [place] (or called to you) after review, usually within 72hours after test completion. If any changes need to be made, you will be notified at that same time.  All other Health Maintenance issues reviewed.   All recommended immunizations and age-appropriate screenings are up-to-date or discussed.  No immunizations administered today.   Medications reviewed and updated.  No changes recommended at this time.  Your prescription(s) have been submitted to your pharmacy. Please take as directed and contact our office if you believe you are having problem(s) with the medication(s).    Please followup in one year    Health Maintenance, Male A healthy lifestyle and preventive care is important for your health and wellness. Ask your health care provider about what schedule of regular examinations is right for you. What should I know about weight and diet?  Eat a Healthy Diet  Eat plenty of vegetables, fruits, whole grains, low-fat dairy products, and lean protein.  Do not eat a lot of foods high in solid fats, added sugars, or salt. Maintain a Healthy Weight  Regular exercise can help you achieve or maintain a healthy weight. You should:  Do at least 150 minutes of exercise  each week. The exercise should increase your heart rate and make you sweat (moderate-intensity exercise).  Do strength-training exercises at least twice a week. Watch Your Levels of Cholesterol and Blood Lipids  Have your blood tested for lipids and cholesterol every 5 years starting at 70 years of age. If you are at high risk for heart disease, you should start having your blood tested when you are 70 years old. You may need to have your cholesterol levels checked more often if:  Your lipid or cholesterol levels are high.  You are older than 69 years of age.  You are at high risk for heart disease. What should I know about cancer screening? Many types of cancers can be detected early and may often be prevented. Lung Cancer  You should be screened every year for lung cancer if:  You are a current smoker who has smoked for at least 30 years.  You are a former smoker who has quit within the past 15 years.  Talk to your health care provider about your screening options, when you should start screening, and how often you should be screened. Colorectal Cancer  Routine colorectal cancer screening usually begins at 70 years of age and should be repeated every 5-10 years until you are 70 years old. You may need to be screened more often if early forms of precancerous polyps or small growths are found. Your health care provider may recommend screening at an earlier age  if you have risk factors for colon cancer.  Your health care provider may recommend using home test kits to check for hidden blood in the stool.  A small camera at the end of a tube can be used to examine your colon (sigmoidoscopy or colonoscopy). This checks for the earliest forms of colorectal cancer. Prostate and Testicular Cancer  Depending on your age and overall health, your health care provider may do certain tests to screen for prostate and testicular cancer.  Talk to your health care provider about any symptoms or  concerns you have about testicular or prostate cancer. Skin Cancer  Check your skin from head to toe regularly.  Tell your health care provider about any new moles or changes in moles, especially if:  There is a change in a mole's size, shape, or color.  You have a mole that is larger than a pencil eraser.  Always use sunscreen. Apply sunscreen liberally and repeat throughout the day.  Protect yourself by wearing long sleeves, pants, a wide-brimmed hat, and sunglasses when outside. What should I know about heart disease, diabetes, and high blood pressure?  If you are 9-36 years of age, have your blood pressure checked every 3-5 years. If you are 73 years of age or older, have your blood pressure checked every year. You should have your blood pressure measured twice-once when you are at a hospital or clinic, and once when you are not at a hospital or clinic. Record the average of the two measurements. To check your blood pressure when you are not at a hospital or clinic, you can use:  An automated blood pressure machine at a pharmacy.  A home blood pressure monitor.  Talk to your health care provider about your target blood pressure.  If you are between 64-65 years old, ask your health care provider if you should take aspirin to prevent heart disease.  Have regular diabetes screenings by checking your fasting blood sugar level.  If you are at a normal weight and have a low risk for diabetes, have this test once every three years after the age of 8.  If you are overweight and have a high risk for diabetes, consider being tested at a younger age or more often.  A one-time screening for abdominal aortic aneurysm (AAA) by ultrasound is recommended for men aged 23-75 years who are current or former smokers. What should I know about preventing infection? Hepatitis B  If you have a higher risk for hepatitis B, you should be screened for this virus. Talk with your health care provider to  find out if you are at risk for hepatitis B infection. Hepatitis C  Blood testing is recommended for:  Everyone born from [birth year] through 1964.  Anyone with known risk factors for hepatitis C. Sexually Transmitted Diseases (STDs)  You should be screened each year for STDs including gonorrhea and chlamydia if:  You are sexually active and are younger than 70 years of age.  You are older than 70 years of age and your health care provider tells you that you are at risk for this type of infection.  Your sexual activity has changed since you were last screened and you are at an increased risk for chlamydia or gonorrhea. Ask your health care provider if you are at risk.  Talk with your health care provider about whether you are at high risk of being infected with HIV. Your health care provider may recommend a prescription medicine to help prevent HIV  infection. What else can I do?  Schedule regular health, dental, and eye exams.  Stay current with your vaccines (immunizations).  Do not use any tobacco products, such as cigarettes, chewing tobacco, and e-cigarettes. If you need help quitting, ask your health care provider.  Limit alcohol intake to no more than 2 drinks per day. One drink equals 12 ounces of beer, 5 ounces of wine, or 1 ounces of hard liquor.  Do not use street drugs.  Do not share needles.  Ask your health care provider for help if you need support or information about quitting drugs.  Tell your health care provider if you often feel depressed.  Tell your health care provider if you have ever been abused or do not feel safe at home. This information is not intended to replace advice given to you by your health care provider. Make sure you discuss any questions you have with your health care provider. Document Released: 11/06/2007 Document Revised: 01/07/2016 Document Reviewed: 02/11/2015 Elsevier Interactive Patient Education  2017 [employer].

## 2016-09-15 NOTE — Progress Notes (Signed)
Pre visit review using our clinic review tool, if applicable. No additional management support is needed unless otherwise documented below in the visit note. 

## 2016-09-15 NOTE — Assessment & Plan Note (Signed)
US ordered

## 2016-09-15 NOTE — Assessment & Plan Note (Signed)
Check a1c 

## 2016-09-15 NOTE — Assessment & Plan Note (Signed)
No longer following with urology monitored > 10 years - no evidence of recurrence

## 2016-09-15 NOTE — Assessment & Plan Note (Signed)
Check lipid panel  Continue daily statin Regular exercise and healthy diet encouraged  

## 2016-09-15 NOTE — Progress Notes (Signed)
Subjective:    Patient ID: Jeremy Waters, male    DOB: 1946/03/31, 71 y.o.   MRN: [id number]  HPI Here for subsequent medicare wellness exam and annual physical exam.   I have personally reviewed and have noted 1.The patient's medical and social history 2.Their use of alcohol, tobacco or illicit drugs 3.Their current medications and supplements 4.The patient's functional ability including ADL's, fall risks, home safety risks and hearing or visual impairment. 5.Diet and physical activities 6.Evidence for depression or mood disorders 7.Care team reviewed  - derm - Dr drew Ronnald Ramp, hearing - Bryson Corona - [place] [facility], ear nose, eye - dr digby's office   Are there smokers in your home (other than you)? No  Risk Factors Exercise: golf, daily exercise aerobics, weights, stretching Dietary issues discussed: well balanced, minimal red meat, minimal carbs  Cardiac risk factors: advanced age, hypertension, hyperlipidemia  Depression Screen  Have you felt down, depressed or hopeless? No  Have you felt little interest or pleasure in doing things?  No  Activities of Daily Living In your present state of health, do you have any difficulty performing the following activities?:  Driving? No Managing money?  No Feeding yourself? No Getting from bed to chair? No Climbing a flight of stairs? No Preparing food and eating?: No Bathing or showering? No Getting dressed: No Getting to/using the toilet? No Moving around from place to place: No In the past year have you fallen or had a near fall?: No   Are you sexually active?  yes  Do you have more than one partner?  No  Hearing Difficulties: yes  - wears hearing aid Do you often ask people to speak up or repeat themselves? yes Do you experience ringing or noises in your ears? No Do you have difficulty understanding soft or whispered voices? yes Vision:              Any  change in vision:  Yes - difficulty at night - cataracts             Up to date with eye exam: has appt next month - Dr Digby's office  Memory:  Do you feel that you have a problem with memory? No  Do you often misplace items? No  Do you feel safe at home?  Yes  Cognitive Testing  Alert, Orientated? Yes  Normal Appearance? Yes  Recall of three objects?  Yes  Can perform simple calculations? Yes  Displays appropriate judgment? Yes  Can read the correct time from a watch face? Yes   Advanced Directives have been discussed with the patient? Yes - in place     Medications and allergies reviewed with patient and updated if appropriate.  Patient Active Problem List   Diagnosis Date Noted  . Spinal stenosis, lumbar 10/08/2015  . Sensorineural hearing loss of both ears 04/10/2014  . Organic impotence 04/10/2014  . Hyperglycemia 04/02/2014  . S/P left TKA 07/03/2013  . Left knee DJD 06/07/2013  . Right carotid bruit 06/07/2013  . Hypothyroidism 03/03/2012  . Axillary mass, right 04/07/2011  . ROSACEA 03/19/2010  . ARTHRALGIA 03/19/2010  . MYALGIA 03/19/2010  . CERVICALGIA 09/05/2009  . CARPAL TUNNEL SYNDROME 06/17/2008  . DEGENERATIVE JOINT DISEASE, GENERALIZED 06/17/2008  . CERVICAL RADICULOPATHY, LEFT 06/17/2008  . PARESTHESIA, HANDS 06/17/2008  . Disturbance in sleep behavior 03/04/2008  . PROSTATE CANCER, HX OF 03/04/2008  . Hyperlipidemia 06/15/2007  . DIVERTICULOSIS, COLON W/O HEM 03/03/2007    Current Outpatient Prescriptions on  File Prior to Visit  Medication Sig Dispense Refill  . Albuterol Sulfate (PROAIR RESPICLICK) 093 (90 BASE) MCG/ACT AEPB Inhale 2 puffs into the lungs every 6 (six) hours as needed. 1 each 2  . clonazePAM (KLONOPIN) 0.5 MG tablet TAKE ONE TABLET BY MOUTH AT BEDTIME AS NEEDED. 30 tablet 0  . docusate sodium (COLACE) 100 MG capsule Take 1 capsule (100 mg total) by mouth 2 (two) times daily. 40 capsule 1  . fluticasone (FLONASE) 50 MCG/ACT nasal  spray PLACE 1 SPRAY INTO BOTH NOSTRILS DAILY. (Patient taking differently: Place 1 spray into both nostrils daily. ) 16 g 11  . levothyroxine (SYNTHROID, LEVOTHROID) 25 MCG tablet Take 1 tablet (25 mcg total) by mouth daily before breakfast. Yearly physical w/labs due in April must see MD for future refills 90 tablet 0  . metroNIDAZOLE (METROCREAM) 0.75 % cream Apply topically 2 (two) times daily.    . sildenafil (REVATIO) 20 MG tablet Take 2-5 tablets daily as needed for erectile dysfunction    . simvastatin (ZOCOR) 20 MG tablet TAKE 1 TABLET (20 MG TOTAL) BY MOUTH AT BEDTIME. 90 tablet 0  . vitamin C (ASCORBIC ACID) 500 MG tablet Take 500 mg by mouth 2 (two) times daily.      No current facility-administered medications on file prior to visit.     Past Medical History:  Diagnosis Date  . Arthritis    OA / PAIN LEFT KNEE  . Colon polyp   . Diverticulosis of colon    w/o hemorrage  . Heart murmur    TOLD HE HAS A SLIGHT MURMUR  . Hyperlipidemia   . Inguinal hernia    left side   . Prostate cancer ([place])   . Thyroid disease     Past Surgical History:  Procedure Laterality Date  . BACK SURGERY  05/12/2005   L5  . COLONOSCOPY  1999   Negative; Dr Fuller Plan  . COLONOSCOPY  2004   tics, hemorrhoids  . COLONOSCOPY  2009   polyps ([place]  . HERNIA REPAIR  1987  . HERNIA REPAIR  10/2003  . KNEE SURGERY  1969   Patellar fracture fragment Lt  . LAMINECTOMY  2000   L4-5  . LUMBAR LAMINECTOMY/DECOMPRESSION MICRODISCECTOMY Right 10/08/2015   Procedure: MICRO LUMBAR DECOMPRESSION L4-L5 AND L5-S1 ON RIGHT ;  Surgeon: Susa Day, MD;  Location: [facility];  Service: Orthopedics;  Laterality: Right;  . MASS EXCISION  04/26/2011   Procedure: MINOR EXCISION OF MASS;  Surgeon: Earnstine Regal, MD;  Location: [place] [facility];  Service: General;  Laterality: Right;  Excise subcutaneous mass right axilla Minor Room  . PROSTATECTOMY  04/16/2005  . TONSILLECTOMY    . TOTAL KNEE ARTHROPLASTY  Left 07/03/2013   Procedure: LEFT TOTAL KNEE ARTHROPLASTY;  Surgeon: Mauri Pole, MD;  Location: [facility];  Service: Orthopedics;  Laterality: Left;    Social History   Social History  . Marital status: Married    Spouse name: N/A  . Number of children: N/A  . Years of education: N/A   Occupational History  . insur ex[employer]   Social History Main Topics  . Smoking status: Former Research scientist (life sciences)  . Smokeless tobacco: Never Used     Comment: stopped smoking cigarettes 1983, 1 cigar /day 2001-2013  . Alcohol use 3.5 oz/week    7 Standard drinks or equivalent per week     Comment: scotch or wine daily  . Drug use: No  . Sexual  activity: Not Asked   Other Topics Concern  . None   Social History Narrative   Exercises daily                Family History  Problem Relation Age of Onset  . Lung cancer Mother   . COPD Mother   . Cancer Father     Bladder Cancer  . Prostate cancer Father   . Colon cancer Father 18  . Urolithiasis Father   . Hypertension Paternal Uncle   . Alcohol abuse Paternal Uncle   . Stroke Paternal Uncle 79  . Lung cancer Sister   . Asthma Neg Hx   . Heart disease Neg Hx     Review of Systems  Constitutional: Negative for chills, fatigue, fever and unexpected weight change.  HENT: Positive for hearing loss (wears hearing aids). Negative for tinnitus.   Eyes: Positive for visual disturbance (cataract related).  Respiratory: Positive for cough (mild - allergies). Negative for shortness of breath and wheezing.   Cardiovascular: Negative for chest pain, palpitations and leg swelling.  Gastrointestinal: Negative for abdominal pain, blood in stool, constipation, diarrhea and nausea.       No gerd  Genitourinary: Negative for dysuria and hematuria.  Musculoskeletal: Positive for arthralgias and back pain.  Skin: Negative for color change and rash.  Neurological: Negative for light-headedness and headaches.  Psychiatric/Behavioral: Negative  for dysphoric mood. The patient is not nervous/anxious.        Objective:   Vitals:   09/15/16 1349  BP: 110/64  Pulse: (!) 57  Resp: 16  Temp: 98.8 F (37.1 C)   Filed Weights   09/15/16 1349  Weight: 147 lb (66.7 kg)   Body mass index is 22.68 kg/m.  Wt Readings from Last 3 Encounters:  09/15/16 147 lb (66.7 kg)  01/20/16 148 lb (67.1 kg)  12/26/15 148 lb (67.1 kg)     Physical Exam Constitutional: He appears well-developed and well-nourished. No distress.  HENT:  Head: Normocephalic and atraumatic.  Right Ear: External ear normal.  Left Ear: External ear normal.  Mouth/Throat: Oropharynx is clear and moist.  Normal ear canals and TM b/l  Eyes: Conjunctivae and EOM are normal.  Neck: Neck supple. No tracheal deviation present. No thyromegaly present.  No carotid bruit  Cardiovascular: Normal rate, regular rhythm, normal heart sounds and intact distal pulses.   No murmur heard. Pulmonary/Chest: Effort normal and breath sounds normal. No respiratory distress. He has no wheezes. He has no rales.  Abdominal: Soft. Bowel sounds are normal. He exhibits no distension. There is no tenderness.  Genitourinary: deferred  Musculoskeletal: He exhibits no edema.  Lymphadenopathy:   He has no cervical adenopathy.  Skin: Skin is warm and dry. He is not diaphoretic.  Psychiatric: He has a normal mood and affect. His behavior is normal.         Assessment & Plan:   Wellness Exam: Immunizations  Discussed shingrix, others up to date Colonoscopy   Up to date  Eye exams   Up to date  Hearing loss  Yes, wears hearing aids Memory concerns/difficulties  none Independent of ADLs   fully Stressed the importance of regular exercise   Patient received copy of preventative screening tests/immunizations recommended for the next 5-10 years.   Physical exam: Screening blood work  ordered Immunizations  Discussed shingrix, others up to date Colonoscopy   Up to date  Eye exams    Up to date  EKG - done 2017, not  indicated today Exercise  regular Weight   BMI very normal Skin     no concerns Substance abuse    none  See Problem List for Assessment and Plan of chronic medical problems.    FU annually

## 2016-09-15 NOTE — Assessment & Plan Note (Signed)
Check tsh  Titrate med dose if needed  

## 2016-09-20 ENCOUNTER — Other Ambulatory Visit: Payer: Self-pay | Admitting: Internal Medicine

## 2016-09-22 ENCOUNTER — Other Ambulatory Visit (INDEPENDENT_AMBULATORY_CARE_PROVIDER_SITE_OTHER): Payer: PPO

## 2016-09-22 DIAGNOSIS — E7800 Pure hypercholesterolemia, unspecified: Secondary | ICD-10-CM | POA: Diagnosis not present

## 2016-09-22 DIAGNOSIS — R739 Hyperglycemia, unspecified: Secondary | ICD-10-CM

## 2016-09-22 DIAGNOSIS — Z0000 Encounter for general adult medical examination without abnormal findings: Secondary | ICD-10-CM

## 2016-09-22 DIAGNOSIS — I779 Disorder of arteries and arterioles, unspecified: Secondary | ICD-10-CM | POA: Diagnosis not present

## 2016-09-22 DIAGNOSIS — I739 Peripheral vascular disease, unspecified: Secondary | ICD-10-CM

## 2016-09-22 LAB — CBC WITH DIFFERENTIAL/PLATELET
Basophils Absolute: 0 10*3/uL (ref 0.0–0.1)
Basophils Relative: 0.6 % (ref 0.0–3.0)
Eosinophils Absolute: 0.1 10*3/uL (ref 0.0–0.7)
Eosinophils Relative: 1.9 % (ref 0.0–5.0)
HCT: 41.6 % (ref 39.0–52.0)
Hemoglobin: 14.4 g/dL (ref 13.0–17.0)
Lymphocytes Relative: 19.3 % (ref 12.0–46.0)
Lymphs Abs: 1.3 10*3/uL (ref 0.7–4.0)
MCHC: 34.5 g/dL (ref 30.0–36.0)
MCV: 96.7 fl (ref 78.0–100.0)
Monocytes Absolute: 0.9 10*3/uL (ref 0.1–1.0)
Monocytes Relative: 12.5 % — ABNORMAL HIGH (ref 3.0–12.0)
Neutro Abs: 4.5 10*3/uL (ref 1.4–7.7)
Neutrophils Relative %: 65.7 % (ref 43.0–77.0)
Platelets: 258 10*3/uL (ref 150.0–400.0)
RBC: 4.3 Mil/uL (ref 4.22–5.81)
RDW: 13.6 % (ref 11.5–15.5)
WBC: 6.9 10*3/uL (ref 4.0–10.5)

## 2016-09-22 LAB — COMPREHENSIVE METABOLIC PANEL
ALT: 17 U/L (ref 0–53)
AST: 24 U/L (ref 0–37)
Albumin: 4.5 g/dL (ref 3.5–5.2)
Alkaline Phosphatase: 45 U/L (ref 39–117)
BUN: 16 mg/dL (ref 6–23)
CO2: 27 mEq/L (ref 19–32)
Calcium: 9.9 mg/dL (ref 8.4–10.5)
Chloride: 103 mEq/L (ref 96–112)
Creatinine, Ser: 0.91 mg/dL (ref 0.40–1.50)
GFR: 87.65 mL/min (ref >60.00)
Glucose, Bld: 96 mg/dL (ref 70–99)
Potassium: 4.3 mEq/L (ref 3.5–5.1)
Sodium: 137 mEq/L (ref 135–145)
Total Bilirubin: 1.1 mg/dL (ref 0.2–1.2)
Total Protein: 7.5 g/dL (ref 6.0–8.3)

## 2016-09-22 LAB — LIPID PANEL
Cholesterol: 194 mg/dL (ref 0–200)
HDL: 86.4 mg/dL (ref >39.00)
LDL Cholesterol: 94 mg/dL (ref 0–99)
NonHDL: 107.97
Total CHOL/HDL Ratio: 2
Triglycerides: 72 mg/dL (ref 0.0–149.0)
VLDL: 14.4 mg/dL (ref 0.0–40.0)

## 2016-09-22 LAB — HEMOGLOBIN A1C: Hgb A1c MFr Bld: 5.5 % (ref 4.6–6.5)

## 2016-09-22 LAB — TSH: TSH: 3.79 u[IU]/mL (ref 0.35–4.50)

## 2016-09-24 ENCOUNTER — Encounter: Payer: Self-pay | Admitting: Internal Medicine

## 2016-09-24 DIAGNOSIS — H524 Presbyopia: Secondary | ICD-10-CM | POA: Diagnosis not present

## 2016-09-24 DIAGNOSIS — H2513 Age-related nuclear cataract, bilateral: Secondary | ICD-10-CM | POA: Diagnosis not present

## 2016-09-25 ENCOUNTER — Encounter: Payer: Self-pay | Admitting: Internal Medicine

## 2016-09-25 NOTE — Telephone Encounter (Signed)
Please update singrix vaccine

## 2016-09-28 ENCOUNTER — Ambulatory Visit (HOSPITAL_COMMUNITY)
Admission: RE | Admit: 2016-09-28 | Discharge: 2016-09-28 | Disposition: A | Discharge status: Home / Self Care | Payer: PPO | Source: Ambulatory Visit | Attending: Cardiology | Admitting: Cardiology

## 2016-09-28 DIAGNOSIS — E785 Hyperlipidemia, unspecified: Secondary | ICD-10-CM | POA: Insufficient documentation

## 2016-09-28 DIAGNOSIS — I739 Peripheral vascular disease, unspecified: Secondary | ICD-10-CM

## 2016-09-28 DIAGNOSIS — I6523 Occlusion and stenosis of bilateral carotid arteries: Secondary | ICD-10-CM | POA: Diagnosis not present

## 2016-09-28 DIAGNOSIS — I779 Disorder of arteries and arterioles, unspecified: Secondary | ICD-10-CM

## 2016-09-28 DIAGNOSIS — Z720 Tobacco use: Secondary | ICD-10-CM | POA: Diagnosis not present

## 2016-09-29 DIAGNOSIS — M545 Low back pain: Secondary | ICD-10-CM | POA: Diagnosis not present

## 2016-10-01 ENCOUNTER — Encounter: Payer: Self-pay | Admitting: Internal Medicine

## 2016-10-20 ENCOUNTER — Encounter: Payer: Self-pay | Admitting: Internal Medicine

## 2016-10-27 DIAGNOSIS — M545 Low back pain: Secondary | ICD-10-CM | POA: Diagnosis not present

## 2016-11-02 DIAGNOSIS — D2262 Melanocytic nevi of left upper limb, including shoulder: Secondary | ICD-10-CM | POA: Diagnosis not present

## 2016-11-02 DIAGNOSIS — D225 Melanocytic nevi of trunk: Secondary | ICD-10-CM | POA: Diagnosis not present

## 2016-11-02 DIAGNOSIS — Z85828 Personal history of other malignant neoplasm of skin: Secondary | ICD-10-CM | POA: Diagnosis not present

## 2016-11-02 DIAGNOSIS — L812 Freckles: Secondary | ICD-10-CM | POA: Diagnosis not present

## 2016-11-02 DIAGNOSIS — D2261 Melanocytic nevi of right upper limb, including shoulder: Secondary | ICD-10-CM | POA: Diagnosis not present

## 2016-11-02 DIAGNOSIS — L738 Other specified follicular disorders: Secondary | ICD-10-CM | POA: Diagnosis not present

## 2016-11-09 ENCOUNTER — Encounter: Payer: Self-pay | Admitting: *Deleted

## 2016-11-09 ENCOUNTER — Other Ambulatory Visit: Payer: Self-pay | Admitting: *Deleted

## 2016-11-09 NOTE — Patient Outreach (Addendum)
[facility] Screening call, unsuccessful but left a message for a return call. If I do not hear back from the member I will try again within the week.  Eulah Pont. Myrtie Neither, MSN, GNP-BC Gerontological Nurse Practitioner [facility] Care Management [phone]  Pt returned my call and we completed the screening. Pt is quite healthy except for hyperlipidemia. He takes his statin as prescribed and sees his primary care provider routinely. He does not have any care management needs at this time. I am sending out an introductory letter.  Eulah Pont. Myrtie Neither, MSN, [facility] Gerontological Nurse Practitioner [facility] Care Management [phone]

## 2016-11-19 DIAGNOSIS — M545 Low back pain: Secondary | ICD-10-CM | POA: Diagnosis not present

## 2016-12-08 ENCOUNTER — Other Ambulatory Visit: Payer: Self-pay | Admitting: Internal Medicine

## 2016-12-08 DIAGNOSIS — M545 Low back pain: Secondary | ICD-10-CM | POA: Diagnosis not present

## 2016-12-08 NOTE — Telephone Encounter (Signed)
Pt called, needs refill on: clonazePAM (KLONOPIN) 0.5 MG tablet [[id number]]   Pharmacy - [facility] on file

## 2016-12-08 NOTE — Telephone Encounter (Signed)
[place] Controlled Substance Database checked. Last refill 08/27/2016. RX called into pharm.

## 2016-12-22 DIAGNOSIS — M545 Low back pain: Secondary | ICD-10-CM | POA: Diagnosis not present

## 2017-01-05 DIAGNOSIS — M545 Low back pain: Secondary | ICD-10-CM | POA: Diagnosis not present

## 2017-01-20 DIAGNOSIS — M545 Low back pain: Secondary | ICD-10-CM | POA: Diagnosis not present

## 2017-02-28 ENCOUNTER — Encounter: Payer: Self-pay | Admitting: Internal Medicine

## 2017-03-02 DIAGNOSIS — M545 Low back pain: Secondary | ICD-10-CM | POA: Diagnosis not present

## 2017-03-03 ENCOUNTER — Ambulatory Visit (INDEPENDENT_AMBULATORY_CARE_PROVIDER_SITE_OTHER): Payer: PPO | Admitting: Internal Medicine

## 2017-03-03 ENCOUNTER — Encounter: Payer: Self-pay | Admitting: Internal Medicine

## 2017-03-03 VITALS — BP 124/82 | HR 68 | Temp 97.9°F | Ht 67.5 in | Wt 145.0 lb

## 2017-03-03 DIAGNOSIS — M5412 Radiculopathy, cervical region: Secondary | ICD-10-CM | POA: Diagnosis not present

## 2017-03-03 DIAGNOSIS — Z23 Encounter for immunization: Secondary | ICD-10-CM

## 2017-03-03 MED ORDER — TRAMADOL HCL 50 MG PO TABS: 50.0000 mg | ORAL_TABLET | Freq: Four times a day (QID) | ORAL | 2 refills | Status: DC | PRN

## 2017-03-03 MED ORDER — PREDNISONE 10 MG PO TABS: ORAL_TABLET | ORAL | 0 refills | Status: DC

## 2017-03-03 NOTE — Progress Notes (Signed)
Subjective:    Patient ID: Jeremy Waters, male    DOB: [birth year], 71 y.o.   MRN: [id number]  HPI  Here with c/o severe right neck pain x 3 days, sudden onset without obvious cause, quite severe the first day with some radiation of what sounds like possibly lancinating pain to behind the left ear, but though that has improved now has similar pain to right neck radiating to the trapezoid.  [facility] and cold pack which may have helped somewhat, but the massage did not.  Has little pain when stays still, but returns every time with any movement of head and neck.  Pt denies bowel or bladder change, fever, wt loss, worsening LE pain/numbness/weakness, gait change or falls.   Pt denies fever, wt loss, night sweats, loss of appetite, or other constitutional symptoms Past Medical History:  Diagnosis Date  . Arthritis    OA / PAIN LEFT KNEE  . Colon polyp   . Diverticulosis of colon    w/o hemorrage  . Heart murmur    TOLD HE HAS A SLIGHT MURMUR  . Hyperlipidemia   . Inguinal hernia    left side   . Prostate cancer ([place])   . Thyroid disease    Past Surgical History:  Procedure Laterality Date  . BACK SURGERY  05/12/2005   L5  . COLONOSCOPY  1999   Negative; Dr Fuller Plan  . COLONOSCOPY  2004   tics, hemorrhoids  . COLONOSCOPY  2009   polyps ([facility]  . HERNIA REPAIR  1987  . HERNIA REPAIR  10/2003  . KNEE SURGERY  1969   Patellar fracture fragment Lt  . LAMINECTOMY  2000   L4-5  . LUMBAR LAMINECTOMY/DECOMPRESSION MICRODISCECTOMY Right 10/08/2015   Procedure: MICRO LUMBAR DECOMPRESSION L4-L5 AND L5-S1 ON RIGHT ;  Surgeon: Susa Day, MD;  Location: [facility];  Service: Orthopedics;  Laterality: Right;  . MASS EXCISION  04/26/2011   Procedure: MINOR EXCISION OF MASS;  Surgeon: Earnstine Regal, MD;  Location: [facility];  Service: General;  Laterality: Right;  Excise subcutaneous mass right axilla Minor Room  . PROSTATECTOMY   04/16/2005  . TONSILLECTOMY    . TOTAL KNEE ARTHROPLASTY Left 07/03/2013   Procedure: LEFT TOTAL KNEE ARTHROPLASTY;  Surgeon: Mauri Pole, MD;  Location: [facility];  Service: Orthopedics;  Laterality: Left;    reports that he has quit smoking. He has never used smokeless tobacco. He reports that he drinks about 3.5 oz of alcohol per week . He reports that he does not use drugs. family history includes Alcohol abuse in his paternal uncle; COPD in his mother; Cancer in his father; Colon cancer (age of onset: 13) in his father; Hypertension in his paternal uncle; Lung cancer in his mother and sister; Prostate cancer in his father; Stroke (age of onset: 102) in his paternal uncle; Urolithiasis in his father. Allergies  Allergen Reactions  . Atorvastatin Other (See Comments)    Increased LFTs   Review of Systems All other system neg per pt    Objective:   Physical Exam BP 124/82   Pulse 68   Temp 97.9 F (36.6 C) (Oral)   Ht 5' 7.5" (1.715 m)   Wt 145 lb (65.8 kg)   SpO2 99%   BMI 22.38 kg/m  VS noted,  Constitutional: Pt appears in NAD HENT: Head: NCAT.  Right Ear: External ear normal.  Left Ear: External ear normal.  Eyes: . Pupils are equal, round, and reactive to light. Conjunctivae and EOM are normal Nose: without d/c or deformity Neck: Neck supple. Gross normal ROM, has nontender spine in midline or paravertebral swelling or tenderness Cardiovascular: Normal rate and regular rhythm.   Pulmonary/Chest: Effort normal and breath sounds without rales or wheezing.  Abd:  Soft, NT, ND, + BS, no organomegaly Neurological: Pt is alert. At baseline orientation, motor 5/5 intact except 4+/5 RUE weakness, sens intact, cn 2-12 intact Skin: Skin is warm. No rashes, other new lesions, no LE edema Psychiatric: Pt behavior is normal without agitation  No other exam findings     Assessment & Plan:

## 2017-03-03 NOTE — Patient Instructions (Addendum)
Please take all new medication as prescribed - the tramadol for pain, as the prednisone as prescribed  You will be contacted regarding the referral for: MRI for the neck  You had the flu shot today, and the shingrix if this was available  Please continue all other medications as before, and refills have been done if requested.  Please have the pharmacy call with any other refills you may need.  Please keep your appointments with your specialists as you may have planned

## 2017-03-03 NOTE — Assessment & Plan Note (Signed)
Mod to severe with neuro change, for MRI c spine, tramadol prn, and predpac asd,  to f/u any worsening symptoms or concerns

## 2017-03-03 NOTE — Progress Notes (Signed)
   Subjective:    Patient ID: Jeremy Waters, male    DOB: [birth year], 71 y.o.   MRN: [id number]  HPI    Review of Systems     Objective:   Physical Exam        Assessment & Plan:

## 2017-03-07 ENCOUNTER — Ambulatory Visit: Payer: PPO

## 2017-03-07 DIAGNOSIS — M545 Low back pain: Secondary | ICD-10-CM | POA: Diagnosis not present

## 2017-03-14 ENCOUNTER — Encounter: Payer: Self-pay | Admitting: Internal Medicine

## 2017-03-14 MED ORDER — PREDNISONE 10 MG PO TABS: ORAL_TABLET | ORAL | 0 refills | Status: DC

## 2017-03-15 DIAGNOSIS — M545 Low back pain: Secondary | ICD-10-CM | POA: Diagnosis not present

## 2017-03-21 ENCOUNTER — Other Ambulatory Visit: Payer: Self-pay | Admitting: Internal Medicine

## 2017-03-21 ENCOUNTER — Ambulatory Visit
Admission: RE | Admit: 2017-03-21 | Discharge: 2017-03-21 | Disposition: A | Discharge status: Home / Self Care | Payer: PPO | Source: Ambulatory Visit | Attending: Internal Medicine | Admitting: Internal Medicine

## 2017-03-21 ENCOUNTER — Telehealth: Payer: Self-pay

## 2017-03-21 DIAGNOSIS — M5412 Radiculopathy, cervical region: Secondary | ICD-10-CM

## 2017-03-21 DIAGNOSIS — M4802 Spinal stenosis, cervical region: Secondary | ICD-10-CM | POA: Diagnosis not present

## 2017-03-21 NOTE — Telephone Encounter (Signed)
-----   Message from Biagio Borg, MD sent at 03/21/2017 12:46 PM EDT ----- Left message on MyChart, pt to cont same tx except \ The test results show that your current treatment is OK, except the MRI does confirm that you have 3 levels in the neck on the right side that could potentially be a pinched nerve resulting in your symptoms.  With your permission, I will refer you to Dr Tonita Cong whom I understand you have seen in 2017.  If you have a different preference for another provider, please let me know.Jeremy Waters to please inform pt, I will do referral

## 2017-03-21 NOTE — Telephone Encounter (Signed)
Pt has been informed and expressed understanding. He would like the referral to go to Dr. Tonita Cong.

## 2017-03-29 DIAGNOSIS — M50122 Cervical disc disorder at C5-C6 level with radiculopathy: Secondary | ICD-10-CM | POA: Diagnosis not present

## 2017-03-29 DIAGNOSIS — M542 Cervicalgia: Secondary | ICD-10-CM | POA: Diagnosis not present

## 2017-03-30 DIAGNOSIS — M545 Low back pain: Secondary | ICD-10-CM | POA: Diagnosis not present

## 2017-04-05 ENCOUNTER — Other Ambulatory Visit: Payer: Self-pay | Admitting: Internal Medicine

## 2017-04-05 NOTE — Telephone Encounter (Signed)
[place] Controlled Substance Database checked. Last filled on 12/08/16

## 2017-04-06 DIAGNOSIS — H903 Sensorineural hearing loss, bilateral: Secondary | ICD-10-CM | POA: Diagnosis not present

## 2017-04-06 DIAGNOSIS — Z87891 Personal history of nicotine dependence: Secondary | ICD-10-CM | POA: Diagnosis not present

## 2017-04-06 DIAGNOSIS — Z974 Presence of external hearing-aid: Secondary | ICD-10-CM | POA: Diagnosis not present

## 2017-04-06 DIAGNOSIS — H6123 Impacted cerumen, bilateral: Secondary | ICD-10-CM | POA: Diagnosis not present

## 2017-04-07 DIAGNOSIS — H6123 Impacted cerumen, bilateral: Secondary | ICD-10-CM | POA: Insufficient documentation

## 2017-04-12 DIAGNOSIS — M545 Low back pain: Secondary | ICD-10-CM | POA: Diagnosis not present

## 2017-04-27 DIAGNOSIS — M545 Low back pain: Secondary | ICD-10-CM | POA: Diagnosis not present

## 2017-05-06 DIAGNOSIS — H903 Sensorineural hearing loss, bilateral: Secondary | ICD-10-CM | POA: Diagnosis not present

## 2017-05-12 DIAGNOSIS — M545 Low back pain: Secondary | ICD-10-CM | POA: Diagnosis not present

## 2017-05-31 DIAGNOSIS — M545 Low back pain: Secondary | ICD-10-CM | POA: Diagnosis not present

## 2017-06-15 DIAGNOSIS — M545 Low back pain: Secondary | ICD-10-CM | POA: Diagnosis not present

## 2017-06-17 DIAGNOSIS — H903 Sensorineural hearing loss, bilateral: Secondary | ICD-10-CM | POA: Diagnosis not present

## 2017-06-29 DIAGNOSIS — M545 Low back pain: Secondary | ICD-10-CM | POA: Diagnosis not present

## 2017-07-13 DIAGNOSIS — M545 Low back pain: Secondary | ICD-10-CM | POA: Diagnosis not present

## 2017-07-16 ENCOUNTER — Encounter: Payer: Self-pay | Admitting: Internal Medicine

## 2017-07-20 ENCOUNTER — Other Ambulatory Visit: Payer: Self-pay | Admitting: Internal Medicine

## 2017-07-20 DIAGNOSIS — M545 Low back pain: Secondary | ICD-10-CM | POA: Diagnosis not present

## 2017-07-20 NOTE — Telephone Encounter (Signed)
[place] Controlled Substance Database checked. Last filled on 04/06/17

## 2017-07-22 ENCOUNTER — Other Ambulatory Visit: Payer: Self-pay | Admitting: Internal Medicine

## 2017-07-22 NOTE — Telephone Encounter (Signed)
[place] Controlled Substance Database checked. Last filled on  04/06/17

## 2017-07-27 DIAGNOSIS — M545 Low back pain: Secondary | ICD-10-CM | POA: Diagnosis not present

## 2017-08-01 DIAGNOSIS — Z23 Encounter for immunization: Secondary | ICD-10-CM | POA: Diagnosis not present

## 2017-08-10 DIAGNOSIS — M545 Low back pain: Secondary | ICD-10-CM | POA: Diagnosis not present

## 2017-08-24 ENCOUNTER — Other Ambulatory Visit: Payer: PPO

## 2017-08-24 ENCOUNTER — Encounter: Payer: Self-pay | Admitting: Internal Medicine

## 2017-08-24 ENCOUNTER — Ambulatory Visit (INDEPENDENT_AMBULATORY_CARE_PROVIDER_SITE_OTHER): Payer: PPO | Admitting: Internal Medicine

## 2017-08-24 ENCOUNTER — Other Ambulatory Visit (INDEPENDENT_AMBULATORY_CARE_PROVIDER_SITE_OTHER): Payer: PPO

## 2017-08-24 DIAGNOSIS — R1032 Left lower quadrant pain: Secondary | ICD-10-CM

## 2017-08-24 LAB — CBC WITH DIFFERENTIAL/PLATELET
Basophils Absolute: 0.1 10*3/uL (ref 0.0–0.1)
Basophils Relative: 0.7 % (ref 0.0–3.0)
Eosinophils Absolute: 0.1 10*3/uL (ref 0.0–0.7)
Eosinophils Relative: 0.9 % (ref 0.0–5.0)
HCT: 40 % (ref 39.0–52.0)
Hemoglobin: 13.8 g/dL (ref 13.0–17.0)
Lymphocytes Relative: 14.2 % (ref 12.0–46.0)
Lymphs Abs: 1.6 10*3/uL (ref 0.7–4.0)
MCHC: 34.4 g/dL (ref 30.0–36.0)
MCV: 96.5 fl (ref 78.0–100.0)
Monocytes Absolute: 1.5 10*3/uL — ABNORMAL HIGH (ref 0.1–1.0)
Monocytes Relative: 13.3 % — ABNORMAL HIGH (ref 3.0–12.0)
Neutro Abs: 7.9 10*3/uL — ABNORMAL HIGH (ref 1.4–7.7)
Neutrophils Relative %: 70.9 % (ref 43.0–77.0)
Platelets: 233 10*3/uL (ref 150.0–400.0)
RBC: 4.15 Mil/uL — ABNORMAL LOW (ref 4.22–5.81)
RDW: 13.6 % (ref 11.5–15.5)
WBC: 11.1 10*3/uL — ABNORMAL HIGH (ref 4.0–10.5)

## 2017-08-24 LAB — URINALYSIS, ROUTINE W REFLEX MICROSCOPIC
Bilirubin Urine: NEGATIVE
Hgb urine dipstick: NEGATIVE
Ketones, ur: 15 — AB
Leukocytes, UA: NEGATIVE
Nitrite: NEGATIVE
RBC / HPF: NONE SEEN (ref 0–?)
Specific Gravity, Urine: 1.02 (ref 1.000–1.030)
Total Protein, Urine: NEGATIVE
Urine Glucose: NEGATIVE
Urobilinogen, UA: 0.2 (ref 0.0–1.0)
pH: 6 (ref 5.0–8.0)

## 2017-08-24 LAB — COMPREHENSIVE METABOLIC PANEL
ALT: 15 U/L (ref 0–53)
AST: 22 U/L (ref 0–37)
Albumin: 4.1 g/dL (ref 3.5–5.2)
Alkaline Phosphatase: 50 U/L (ref 39–117)
BUN: 15 mg/dL (ref 6–23)
CO2: 29 mEq/L (ref 19–32)
Calcium: 9.5 mg/dL (ref 8.4–10.5)
Chloride: 100 mEq/L (ref 96–112)
Creatinine, Ser: 0.75 mg/dL (ref 0.40–1.50)
GFR: 109.28 mL/min (ref >60.00)
Glucose, Bld: 89 mg/dL (ref 70–99)
Potassium: 4 mEq/L (ref 3.5–5.1)
Sodium: 136 mEq/L (ref 135–145)
Total Bilirubin: 1.2 mg/dL (ref 0.2–1.2)
Total Protein: 7.4 g/dL (ref 6.0–8.3)

## 2017-08-24 MED ORDER — CIPROFLOXACIN HCL 500 MG PO TABS: 500.0000 mg | ORAL_TABLET | Freq: Two times a day (BID) | ORAL | 0 refills | Status: DC

## 2017-08-24 MED ORDER — METRONIDAZOLE 500 MG PO TABS: 500.0000 mg | ORAL_TABLET | Freq: Three times a day (TID) | ORAL | 0 refills | Status: DC

## 2017-08-24 NOTE — Assessment & Plan Note (Addendum)
Left lower quadrant and to a lesser degree suprapubic pain for 2 days Associated with loose stool and darker stool Last night he did have increased urinary frequency Has diverticulosis on CT scan and concern for diverticulitis Less likely renal stone, UTI/prostatitis  CT scan of the abdomen and pelvis Urinalysis, urine culture, CBC, CMP We will empirically start Cipro, Flagyl and discontinue if CT scan is negative for diverticulitis He deferred pain medication

## 2017-08-24 NOTE — Patient Instructions (Addendum)
Take the antibiotics as prescribed.    Have blood work and urine tests done today.    A CT scan was ordered.

## 2017-08-24 NOTE — Progress Notes (Signed)
Subjective:    Patient ID: Jeremy Waters, male    DOB: 01-03-1946, 72 y.o.   MRN: [id number]  HPI The patient is here for an acute visit.   Abdominal pain:  It started two days ago.  It is a cramping sensation in the LLQ and it is constant.  It varies in intensity.  At worse the pain reaches a 5/10.  He has been having some loose stools that appeared darker than usual.  He denies any blood in the stool.  He has not had diarrhea.  He denies any nausea, vomiting, abdominal distention or heartburn.  He denies any fevers, chills or change in appetite, but he admits he is not eating much over the past couple of days.  Nothing makes the pain worse as far as changing position or lifting.  He denies any lifting that may have caused the pain.  He did try taking Pepto-Bismol, but he does not think that helped.  His stools were dark in color prior to taking the Pepto-Bismol.  He has noticed increased urinary frequency, especially last night.  He went 6 times and typically only goes once or twice.  He denies any pain with urination, blood in the urine or difficulty urinating.  He has had a colonoscopy that did show diverticulosis.  He denies any episodes of diverticulitis.  He had a CAT scan done a few years ago that did show bilateral nonobstructing kidney stones.  He denies any personal history of kidney stones.  Medications and allergies reviewed with patient and updated if appropriate.  Patient Active Problem List   Diagnosis Date Noted  . Right cervical radiculopathy 03/03/2017  . Carotid artery disease ([place]) 09/15/2016  . Spinal stenosis, lumbar 10/08/2015  . Sensorineural hearing loss of both ears 04/10/2014  . Organic impotence 04/10/2014  . Hyperglycemia 04/02/2014  . S/P left TKA 07/03/2013  . Left knee DJD 06/07/2013  . Right carotid bruit 06/07/2013  . Hypothyroidism 03/03/2012  . Axillary mass, right 04/07/2011  . ROSACEA 03/19/2010  . ARTHRALGIA 03/19/2010  . MYALGIA 03/19/2010   . CERVICALGIA 09/05/2009  . CARPAL TUNNEL SYNDROME 06/17/2008  . DEGENERATIVE JOINT DISEASE, GENERALIZED 06/17/2008  . CERVICAL RADICULOPATHY, LEFT 06/17/2008  . Disturbance in sleep behavior 03/04/2008  . PROSTATE CANCER, HX OF 03/04/2008  . Hyperlipidemia 06/15/2007  . DIVERTICULOSIS, COLON W/O HEM 03/03/2007    Current Outpatient Medications on File Prior to Visit  Medication Sig Dispense Refill  . clonazePAM (KLONOPIN) 0.5 MG tablet Take 1 tablet (0.5 mg total) by mouth at bedtime as needed. -- Office visit needed for further refills 30 tablet 0  . fluticasone (FLONASE) 50 MCG/ACT nasal spray PLACE 1 SPRAY INTO BOTH NOSTRILS DAILY. 16 g 10  . levothyroxine (SYNTHROID, LEVOTHROID) 25 MCG tablet Take 1 tablet (25 mcg total) by mouth daily before breakfast. 90 tablet 3  . metroNIDAZOLE (METROCREAM) 0.75 % cream Apply topically 2 (two) times daily.    . sildenafil (REVATIO) 20 MG tablet Take 2-5 tablets daily as needed for erectile dysfunction    . simvastatin (ZOCOR) 20 MG tablet TAKE 1 TABLET (20 MG TOTAL) BY MOUTH AT BEDTIME. 90 tablet 3  . vitamin C (ASCORBIC ACID) 500 MG tablet Take 500 mg by mouth 2 (two) times daily.     . traMADol (ULTRAM) 50 MG tablet Take 1 tablet (50 mg total) by mouth every 6 (six) hours as needed. (Patient not taking: Reported on 08/24/2017) 60 tablet 2   No current facility-administered  medications on file prior to visit.     Past Medical History:  Diagnosis Date  . Arthritis    OA / PAIN LEFT KNEE  . Colon polyp   . Diverticulosis of colon    w/o hemorrage  . Heart murmur    TOLD HE HAS A SLIGHT MURMUR  . Hyperlipidemia   . Inguinal hernia    left side   . Prostate cancer ([place])   . Thyroid disease     Past Surgical History:  Procedure Laterality Date  . BACK SURGERY  05/12/2005   L5  . COLONOSCOPY  1999   Negative; Dr Fuller Plan  . COLONOSCOPY  2004   tics, hemorrhoids  . COLONOSCOPY  2009   polyps ([facility].)  . HERNIA REPAIR  1987  . HERNIA  REPAIR  10/2003  . KNEE SURGERY  1969   Patellar fracture fragment Lt  . LAMINECTOMY  2000   L4-5  . LUMBAR LAMINECTOMY/DECOMPRESSION MICRODISCECTOMY Right 10/08/2015   Procedure: MICRO LUMBAR DECOMPRESSION L4-L5 AND L5-S1 ON RIGHT ;  Surgeon: Susa Day, MD;  Location: [facility];  Service: Orthopedics;  Laterality: Right;  . MASS EXCISION  04/26/2011   Procedure: MINOR EXCISION OF MASS;  Surgeon: Earnstine Regal, MD;  Location: [facility];  Service: General;  Laterality: Right;  Excise subcutaneous mass right axilla Minor Room  . PROSTATECTOMY  04/16/2005  . TONSILLECTOMY    . TOTAL KNEE ARTHROPLASTY Left 07/03/2013   Procedure: LEFT TOTAL KNEE ARTHROPLASTY;  Surgeon: Mauri Pole, MD;  Location[facility];  Service: Orthopedics;  Laterality: Left;    Social History   Socioeconomic History  . Marital status: Married    Spouse name: Not on file  . Number of children: Not on file  . Years of education: Not on file  . Highest education level: Not on file  Occupational History  . Occupation: Nurse, mental health: [employer]  Social Needs  . Financial resource strain: Not on file  . Food insecurity:    Worry: Not on file    Inability: Not on file  . Transportation needs:    Medical: Not on file    Non-medical: Not on file  Tobacco Use  . Smoking status: Former Research scientist (life sciences)  . Smokeless tobacco: Never Used  . Tobacco comment: stopped smoking cigarettes 1983, 1 cigar /day 2001-2013  Substance and Sexual Activity  . Alcohol use: Yes    Alcohol/week: 3.5 oz    Types: 7 Standard drinks or equivalent per week    Comment: scotch or wine daily  . Drug use: No  . Sexual activity: Not on file  Lifestyle  . Physical activity:    Days per week: Not on file    Minutes per session: Not on file  . Stress: Not on file  Relationships  . Social connections:    Talks on phone: Not on file    Gets together: Not on file    Attends religious service: Not on file    Active  member of club or organization: Not on file    Attends meetings of clubs or organizations: Not on file    Relationship status: Not on file  Other Topics Concern  . Not on file  Social History Narrative   Exercises daily             Family History  Problem Relation Age of Onset  . Lung cancer Mother   . COPD Mother   .  Cancer Father        Bladder Cancer  . Prostate cancer Father   . Colon cancer Father 43  . Urolithiasis Father   . Hypertension Paternal Uncle   . Alcohol abuse Paternal Uncle   . Stroke Paternal Uncle 6  . Lung cancer Sister   . Asthma Neg Hx   . Heart disease Neg Hx     Review of Systems  Constitutional: Negative for appetite change, chills and fever.  Gastrointestinal: Negative for abdominal distention, blood in stool (dark stools), diarrhea (loose stools), nausea and vomiting.       No GERD  Genitourinary: Positive for frequency (nocturia x 6 last night - usual is 1-2). Negative for difficulty urinating, dysuria and hematuria.  Neurological: Negative for light-headedness and headaches.       Objective:   Vitals:   08/24/17 1530  BP: 118/78  Pulse: 63  Resp: 16  Temp: 98 F (36.7 C)  SpO2: 98%   BP Readings from Last 3 Encounters:  08/24/17 118/78  03/03/17 124/82  09/15/16 110/64   Wt Readings from Last 3 Encounters:  08/24/17 148 lb (67.1 kg)  03/03/17 145 lb (65.8 kg)  09/15/16 147 lb (66.7 kg)   Body mass index is 22.84 kg/m.   Physical Exam  Constitutional: He appears well-developed and well-nourished. No distress.  HENT:  Head: Normocephalic and atraumatic.  Abdominal: Soft. He exhibits no distension and no mass. There is tenderness (Suprapubic region < left lower quadrant, no other tenderness). There is no rebound and no guarding.  Genitourinary:  Genitourinary Comments: No CVA tenderness, no suprapubic bulge or obvious hernia  Musculoskeletal: He exhibits no edema.  Skin: Skin is warm and dry. He is not diaphoretic.            Assessment & Plan:    See Problem List for Assessment and Plan of chronic medical problems.

## 2017-08-25 ENCOUNTER — Other Ambulatory Visit: Payer: Self-pay | Admitting: Internal Medicine

## 2017-08-25 ENCOUNTER — Ambulatory Visit (INDEPENDENT_AMBULATORY_CARE_PROVIDER_SITE_OTHER)
Admission: RE | Admit: 2017-08-25 | Discharge: 2017-08-25 | Disposition: A | Discharge status: Home / Self Care | Payer: PPO | Source: Ambulatory Visit | Attending: Internal Medicine | Admitting: Internal Medicine

## 2017-08-25 DIAGNOSIS — R935 Abnormal findings on diagnostic imaging of other abdominal regions, including retroperitoneum: Secondary | ICD-10-CM

## 2017-08-25 DIAGNOSIS — R1032 Left lower quadrant pain: Secondary | ICD-10-CM

## 2017-08-25 DIAGNOSIS — R197 Diarrhea, unspecified: Secondary | ICD-10-CM | POA: Diagnosis not present

## 2017-08-25 DIAGNOSIS — K5792 Diverticulitis of intestine, part unspecified, without perforation or abscess without bleeding: Secondary | ICD-10-CM

## 2017-08-25 LAB — URINE CULTURE
MICRO NUMBER:: 90412066
Result:: NO GROWTH
SPECIMEN QUALITY:: ADEQUATE

## 2017-08-25 IMAGING — DX DG LUMBAR SPINE 2-3V
2 series · 2 of 2 positions shown · non-contrast
Comparison: 09/09/2015

CLINICAL DATA: Herniated  disc, preop for lumbar surgery

EXAM:
LUMBAR SPINE - 2-3 VIEW

[l-spine ap]
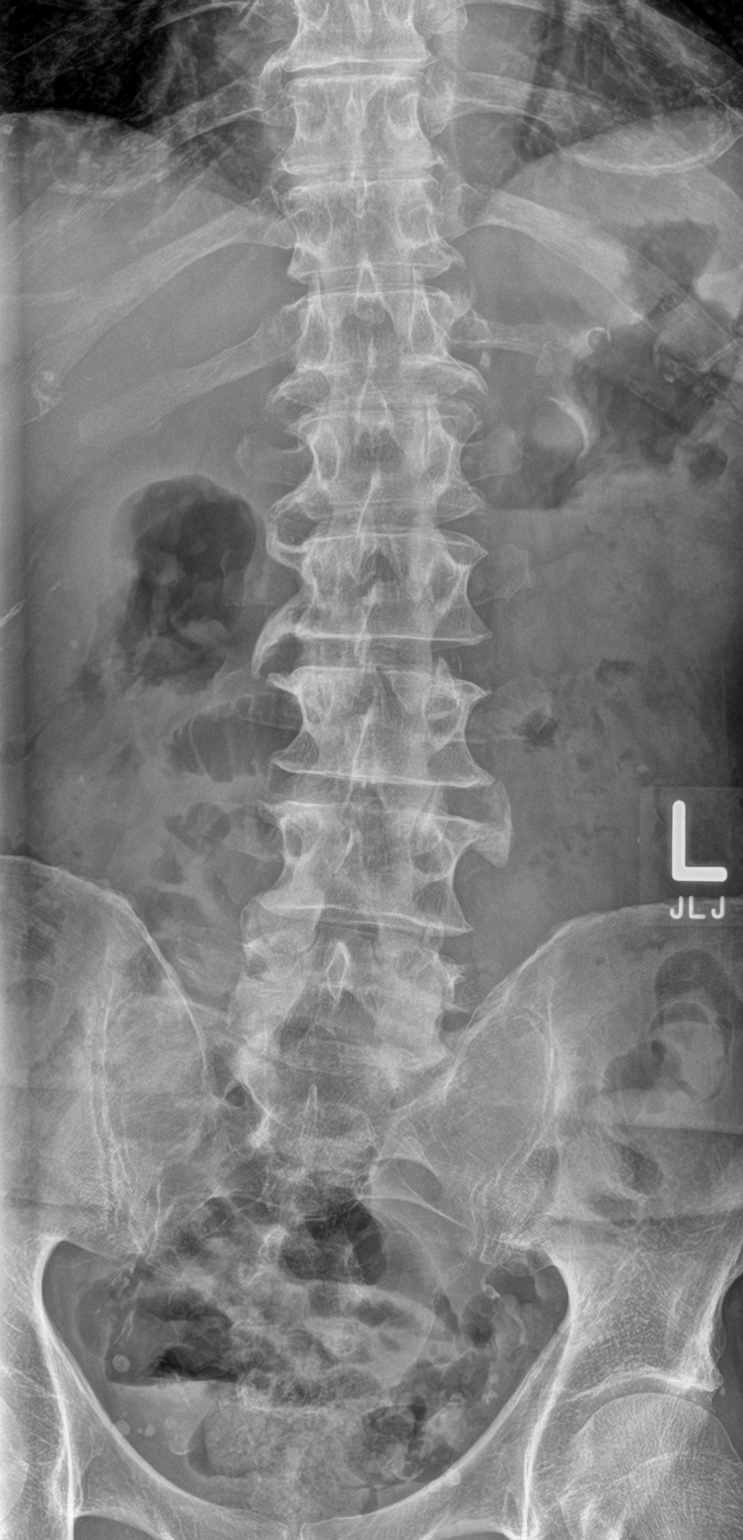

[l-spine lat]
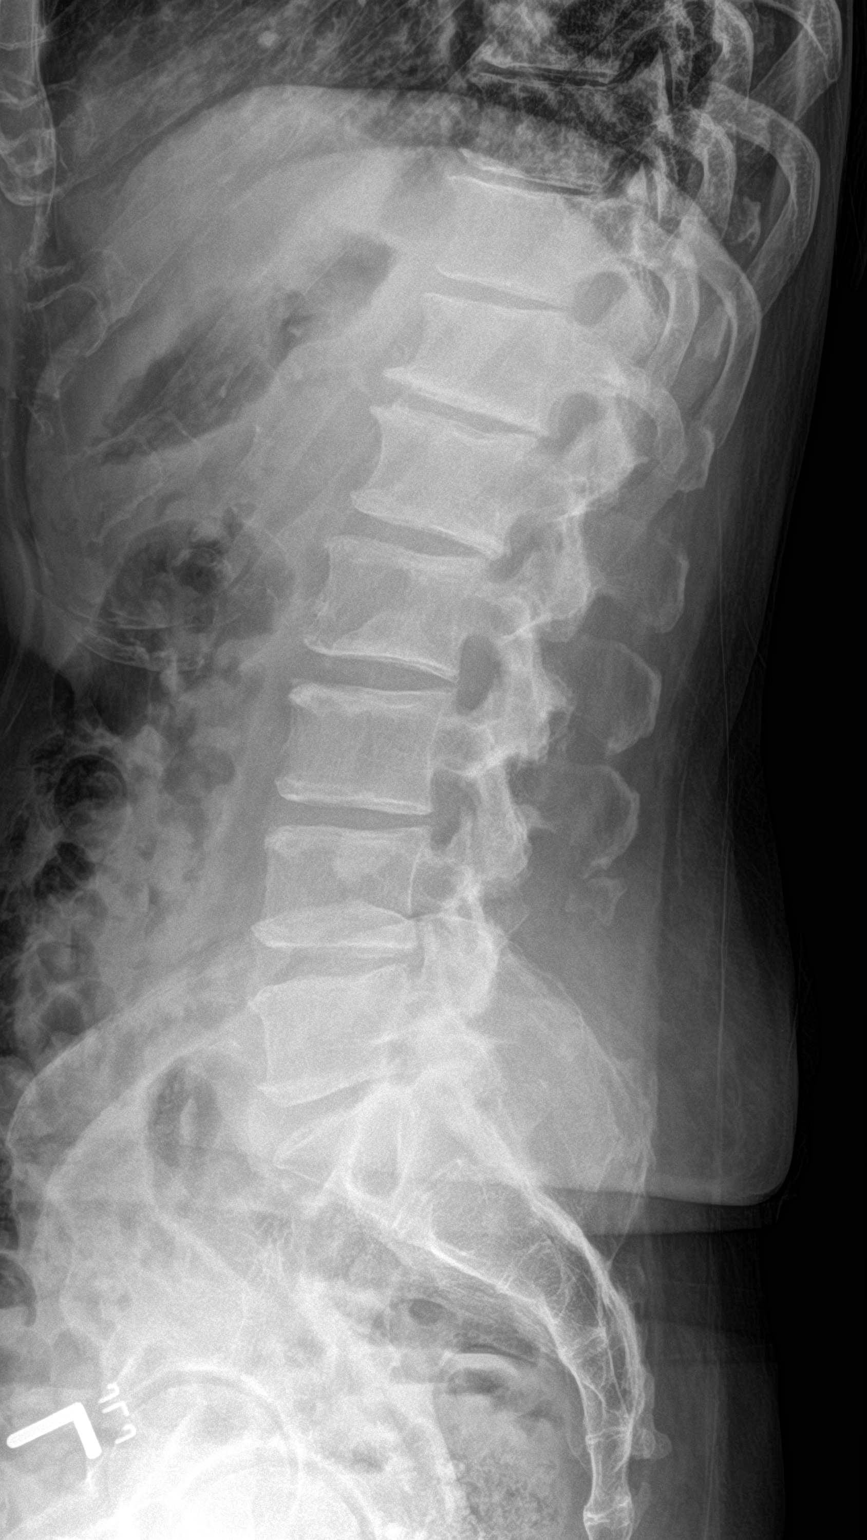

[2 of 2 positions shown; findings below may reference images not displayed]

FINDINGS: Two views of the lumbar spine submitted. Mild levoscoliosis. There
is mild disc space flattening with mild anterior spurring at T12-L1
level. Minimal anterior spurring and disc space flattening at L1-L2
level. Mild disc space flattening at L3-L4 level. Mild disc space
flattening with anterior spurring at L4-L5 level. Mild to moderate
disc space flattening with anterior spurring at L5-S1 level.
IMPRESSION: No acute fracture or subluxation. Mild levoscoliosis. Degenerative
changes as described above.

## 2017-08-25 MED ORDER — IOPAMIDOL (ISOVUE-300) INJECTION 61%
100.0000 mL | Freq: Once | INTRAVENOUS | Status: AC | PRN
  Administered 2017-08-25: 100 mL via INTRAVENOUS

## 2017-08-25 NOTE — Progress Notes (Signed)
ref

## 2017-08-29 ENCOUNTER — Encounter: Payer: Self-pay | Admitting: Internal Medicine

## 2017-08-30 IMAGING — DX DG SPINE 1V PORT
1 series · 1 of 1 positions shown · non-contrast
Comparison: Films earlier today.

CLINICAL DATA: Surgical level L5-S1. Please number intervertebral
disc spaces.

EXAM:
PORTABLE SPINE - 1 VIEW

[l-spine lat]
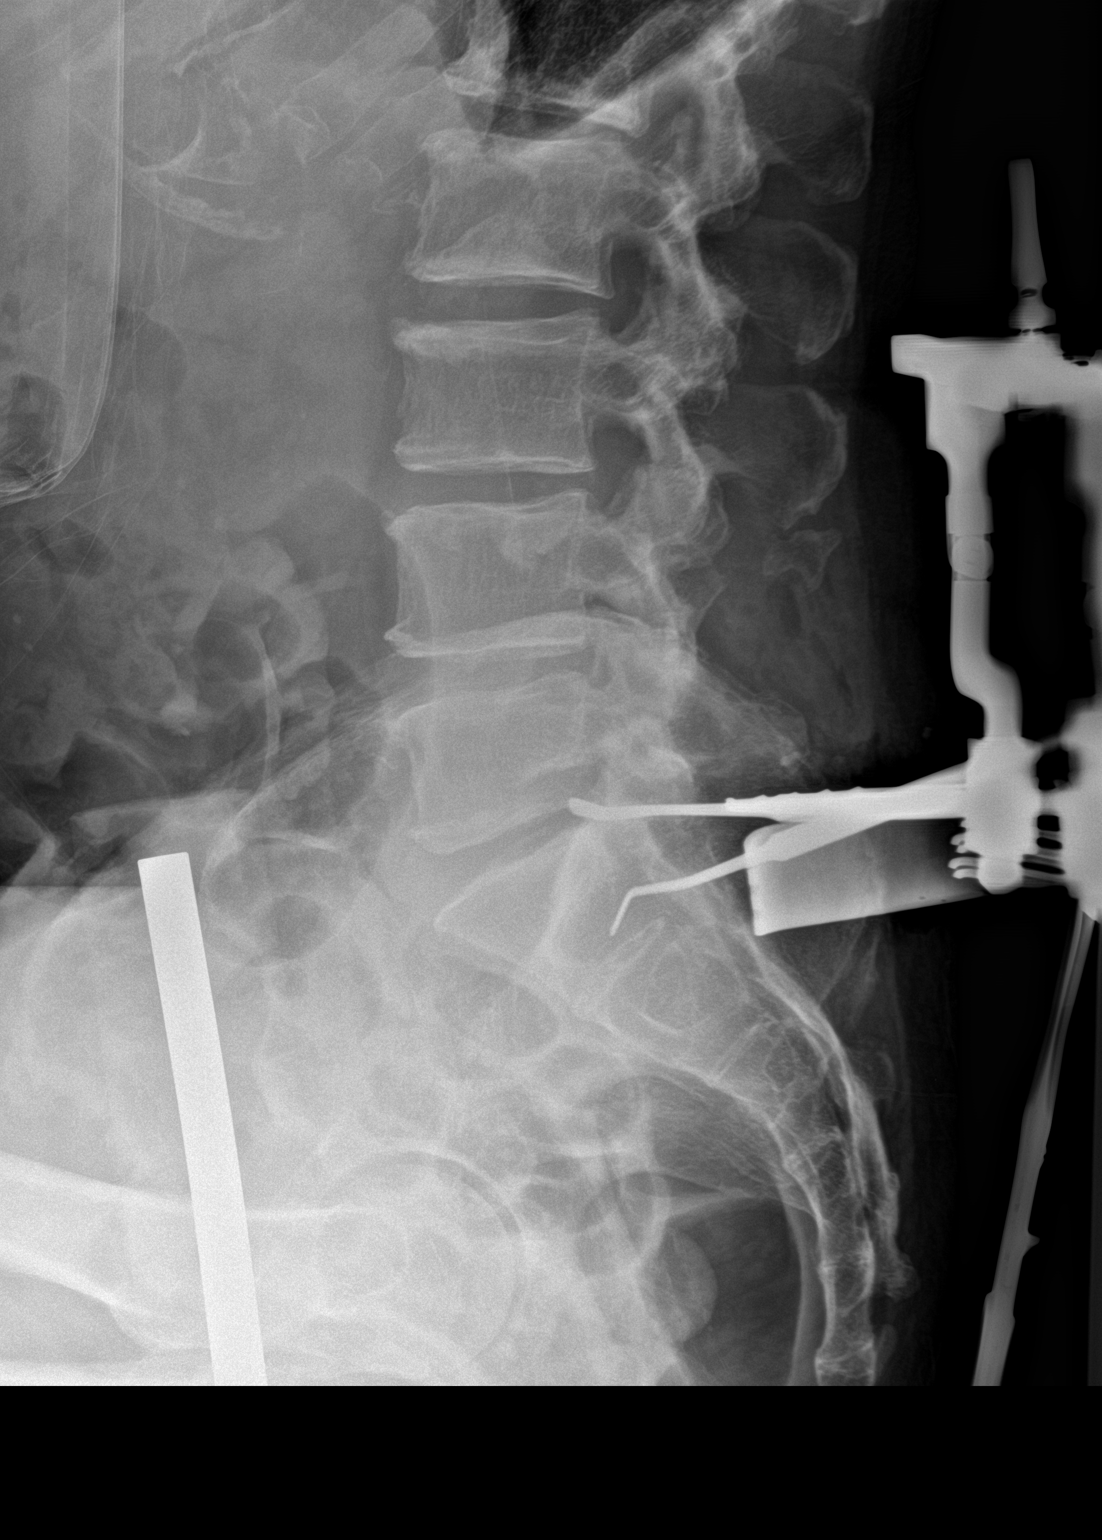

[1 of 1 positions shown; findings below may reference images not displayed]

FINDINGS: Spinal numbering as on preoperative MRI. The L5-S1 disc space is
labeled as requested. A probe overlaps the posterior L5-S1
interspace.
IMPRESSION: Intraoperative localization as described.

## 2017-08-31 DIAGNOSIS — M545 Low back pain: Secondary | ICD-10-CM | POA: Diagnosis not present

## 2017-09-14 DIAGNOSIS — M545 Low back pain: Secondary | ICD-10-CM | POA: Diagnosis not present

## 2017-09-27 DIAGNOSIS — H524 Presbyopia: Secondary | ICD-10-CM | POA: Diagnosis not present

## 2017-09-29 DIAGNOSIS — M545 Low back pain: Secondary | ICD-10-CM | POA: Diagnosis not present

## 2017-10-11 ENCOUNTER — Ambulatory Visit (INDEPENDENT_AMBULATORY_CARE_PROVIDER_SITE_OTHER): Payer: PPO | Admitting: Gastroenterology

## 2017-10-11 ENCOUNTER — Encounter

## 2017-10-11 ENCOUNTER — Ambulatory Visit (INDEPENDENT_AMBULATORY_CARE_PROVIDER_SITE_OTHER): Payer: PPO | Admitting: *Deleted

## 2017-10-11 ENCOUNTER — Encounter: Payer: Self-pay | Admitting: Gastroenterology

## 2017-10-11 VITALS — BP 110/60 | HR 66 | Ht 67.0 in | Wt 146.5 lb

## 2017-10-11 VITALS — BP 118/56 | HR 58 | Resp 18 | Ht 68.0 in | Wt 146.0 lb

## 2017-10-11 DIAGNOSIS — R933 Abnormal findings on diagnostic imaging of other parts of digestive tract: Secondary | ICD-10-CM | POA: Diagnosis not present

## 2017-10-11 DIAGNOSIS — R194 Change in bowel habit: Secondary | ICD-10-CM | POA: Diagnosis not present

## 2017-10-11 DIAGNOSIS — Z0000 Encounter for general adult medical examination without abnormal findings: Secondary | ICD-10-CM

## 2017-10-11 MED ORDER — GLYCOPYRROLATE 1 MG PO TABS: 1.0000 mg | ORAL_TABLET | Freq: Two times a day (BID) | ORAL | 11 refills | Status: DC

## 2017-10-11 MED ORDER — SACCHAROMYCES BOULARDII 250 MG PO CAPS: 250.0000 mg | ORAL_CAPSULE | Freq: Two times a day (BID) | ORAL | 11 refills | Status: DC

## 2017-10-11 MED ORDER — NA SULFATE-K SULFATE-MG SULF 17.5-3.13-1.6 GM/177ML PO SOLN: 1.0000 | Freq: Once | ORAL | 0 refills | Status: AC

## 2017-10-11 NOTE — Progress Notes (Signed)
    History of Present Illness: This is a 72 year old male who developed left lower quadrant pain that worsened over several days and sought care from his PCP.  CT findings below.  He was treated with a course of Cipro and Flagyl for suspected diverticulitis and the pain resolved.  Since that time his bowel habits have significantly changed.  He typically had one bowel movement per day however he now has 3 bowel movements per day which are often occurring 1 to 2 hours following meals.  He has noted audible intestinal rumblings on several occasions.  This is new as well.   Abdominal/pelvic CT 08/25/2017: 1. Abnormality of a short segment of the rectosigmoid colon where multiple diverticula are present. With some pericolonic stranding his probably represents acute uncomplicated diverticulitis. However with a narrowed lumen at that site, malignancy cannot be excluded and continued follow-up is recommended. No adjacent adenopathy is seen. 2. Small bilateral nonobstructing renal calculi. 3. Moderate abdominal aortic atherosclerosis  Current Medications, Allergies, Past Medical History, Past Surgical History, Family History and Social History were reviewed in [employer] record.  Physical Exam: General: Well developed, well nourished, no acute distress Head: Normocephalic and atraumatic Eyes:  sclerae anicteric, EOMI Ears: Normal auditory acuity Mouth: No deformity or lesions Lungs: Clear throughout to auscultation Heart: Regular rate and rhythm; no murmurs, rubs or bruits Abdomen: Soft, non tender and non distended. No masses, hepatosplenomegaly or hernias noted. Normal Bowel sounds Rectal: Deferred to colonoscopy Musculoskeletal: Symmetrical with no gross deformities  Pulses:  Normal pulses noted Extremities: No clubbing, cyanosis, edema or deformities noted Neurological: Alert oriented x 4, grossly nonfocal Psychological:  Alert and cooperative. Normal mood and  affect  Assessment and Recommendations:  1.  Abnormal CT of rectosigmoid colon.  Change in bowel habits.  CT abnormalities very likely secondary to diverticulitis however other lesions need to be further excluded particularly given his change in bowel habits.  Begin glycopyrrolate 1 mg twice daily. Florastor twice daily for 1 month. Schedule colonoscopy. The risks (including bleeding, perforation, infection, missed lesions, medication reactions and possible hospitalization or surgery if complications occur), benefits, and alternatives to colonoscopy with possible biopsy and possible polypectomy were discussed with the patient and they consent to proceed.

## 2017-10-11 NOTE — Patient Instructions (Addendum)
We have sent the following medications to your pharmacy for you to pick up at your convenience: glycopyrrolate and Florastor.  You have been scheduled for a colonoscopy. Please follow written instructions given to you at your visit today.  Please pick up your prep supplies at the pharmacy within the next 1-3 days. If you use inhalers (even only as needed), please bring them with you on the day of your procedure. Your physician has requested that you go to www.startemmi.com and enter the access code given to you at your visit today. This web site gives a general overview about your procedure. However, you should still follow specific instructions given to you by our office regarding your preparation for the procedure.  Normal BMI (Body Mass Index- based on height and weight) is between 23 and 30. Your BMI today is Body mass index is 22.95 kg/m. Marland Kitchen Please consider follow up  regarding your BMI with your Primary Care Provider.  Thank you for choosing me and [facility].  Pricilla Riffle. Dagoberto Ligas., MD., Marval Regal

## 2017-10-11 NOTE — Patient Instructions (Addendum)
Continue doing brain stimulating activities (puzzles, reading, adult coloring books, staying active) to keep memory sharp.   Continue to eat heart healthy diet (full of fruits, vegetables, whole grains, lean protein, water--limit salt, fat, and sugar intake) and increase physical activity as tolerated.   Mr. Heaphy , Thank you for taking time to come for your Medicare Wellness Visit. I appreciate your ongoing commitment to your health goals. Please review the following plan we discussed and let me know if I can assist you in the future.   These are the goals we discussed: Goals    . Patient Stated     Enjoy life, help others through church, continue to spend time with family and grandchildren.        This is a list of the screening recommended for you and due dates:  Health Maintenance  Topic Date Due  . Flu Shot  12/22/2017  . Colon Cancer Screening  01/19/2021  . Tetanus Vaccine  08/02/2027  .  Hepatitis C: One time screening is recommended by Center for Disease Control  (CDC) for  adults born from [birth year] through 1964.   Completed  . Pneumonia vaccines  Completed

## 2017-10-11 NOTE — Progress Notes (Addendum)
Subjective:   Jeremy Waters is a 72 y.o. male who presents for Medicare Annual/Subsequent preventive examination.  Review of Systems:  No ROS.  Medicare Wellness Visit. Additional risk factors are reflected in the social history.  Cardiac Risk Factors include: advanced age (>72mn, >>16women);dyslipidemia;male gender Sleep patterns: feels rested on waking, gets up 1 times nightly to void and sleeps 6-7 hours nightly.    Home Safety/Smoke Alarms: Feels safe in home. Smoke alarms in place.  Living environment; residence and Firearm Safety: 2-story house, no firearms. Lives with wife, no needs for DME, good support system Seat Belt Safety/Bike Helmet: Wears seat belt.     Objective:    Vitals: BP (!) 118/56   Pulse (!) 58   Resp 18   Ht _0  (1.727 m)   Wt 146 lb (66.2 kg)   SpO2 100%   BMI 22.20 kg/m   Body mass index is 22.2 kg/m.  Advanced Directives 10/11/2017 12/26/2015 10/08/2015 10/03/2015 07/03/2013 06/15/2013  Does Patient Have a Medical Advance Directive? Yes Yes Yes Yes Patient has advance directive, copy not in chart Patient has advance directive, copy not in chart  Type of Advance Directive [place]Living will [place]Living will [place] Country VillageLiving will HTerra AltaLiving will HPort [place] will [place]Living will  Does patient want to make changes to medical advance directive? - - Yes - information given - - -  Copy of HCaneyin Chart? No - copy requested - - (No Data) Copy requested from family Copy requested from family    Tobacco Social History   Tobacco Use  Smoking Status Former Smoker  Smokeless Tobacco Never Used  Tobacco Comment   stopped smoking cigarettes 1983, 1 cigar /day 2001-2013     Counseling given: Not Answered Comment: stopped smoking cigarettes 1983, 1 cigar /day 2001-2013  Past Medical History:    Diagnosis Date  . Adenomatous colon polyp 09/2007  . Arthritis    OA / PAIN LEFT KNEE  . Diverticulosis of colon    w/o hemorrage  . Heart murmur    TOLD HE HAS A SLIGHT MURMUR  . Hyperlipidemia   . Inguinal hernia    left side   . [place]   . Thyroid disease    Past Surgical History:  Procedure Laterality Date  . BACK SURGERY  05/12/2005   L5  . COLONOSCOPY  1999   Negative; Dr SFuller Plan . COLONOSCOPY  2004   tics, hemorrhoids  . COLONOSCOPY  2009   polyps ([facility]  . HERNIA REPAIR  1987  . HERNIA REPAIR  10/2003  . KNEE SURGERY  1969   Patellar fracture fragment Lt  . LAMINECTOMY  2000   L4-5  . LUMBAR LAMINECTOMY/DECOMPRESSION MICRODISCECTOMY Right 10/08/2015   Procedure: MICRO LUMBAR DECOMPRESSION L4-L5 AND L5-S1 ON RIGHT ;  Surgeon: JSusa Day MD;  Location: [facility];  Service: Orthopedics;  Laterality: Right;  . MASS EXCISION  04/26/2011   Procedure: MINOR EXCISION OF MASS;  Surgeon: TEarnstine Regal MD;  [place] [facility]  Service: General;  Laterality: Right;  Excise subcutaneous mass right axilla Minor Room  . PROSTATECTOMY  04/16/2005  . TONSILLECTOMY    . TOTAL KNEE ARTHROPLASTY Left 07/03/2013   Procedure: LEFT TOTAL KNEE ARTHROPLASTY;  Surgeon: MMauri Pole MD;  Location: [facility];  Service: Orthopedics;  Laterality: Left;   Family History  Problem  Relation Age of Onset  . Lung cancer Mother   . COPD Mother   . Cancer Father        Bladder Cancer  . Prostate cancer Father   . Colon cancer Father 28  . Urolithiasis Father   . Hypertension Paternal Uncle   . Alcohol abuse Paternal Uncle   . Stroke Paternal Uncle 76  . Lung cancer Sister   . Asthma Neg Hx   . Heart disease Neg Hx    Social History   Socioeconomic History  . Marital status: Married    Spouse name: Not on file  . Number of children: 2  . Years of education: Not on file  . Highest education level: Not on file  Occupational History  . Occupation: Financial trader: [employer]  Social Needs  . Financial resource strain: Not hard at all  . Food insecurity:    Worry: Never true    Inability: Never true  . Transportation needs:    Medical: No    Non-medical: No  Tobacco Use  . Smoking status: Former Research scientist (life sciences)  . Smokeless tobacco: Never Used  . Tobacco comment: stopped smoking cigarettes 1983, 1 cigar /day 2001-2013  Substance and Sexual Activity  . Alcohol use: Yes    Alcohol/week: 3.5 oz    Types: 7 Standard drinks or equivalent per week    Comment: scotch or Nakiyah Beverley daily  . Drug use: No  . Sexual activity: Yes  Lifestyle  . Physical activity:    Days per week: 6 days    Minutes per session: 70 min  . Stress: Not at all  Relationships  . Social connections:    Talks on phone: More than three times a week    Gets together: More than three times a week    Attends religious service: More than 4 times per year    Active member of club or organization: Yes    Attends meetings of clubs or organizations: More than 4 times per year    Relationship status: Married  Other Topics Concern  . Not on file  Social History Narrative   Exercises daily             Outpatient Encounter Medications as of 10/11/2017  Medication Sig  . beta carotene 25000 UNIT capsule Take 25,000 Units by mouth daily.  . clonazePAM (KLONOPIN) 0.5 MG tablet Take 1 tablet (0.5 mg total) by mouth at bedtime as needed. -- Office visit needed for further refills  . fluticasone (FLONASE) 50 MCG/ACT nasal spray PLACE 1 SPRAY INTO BOTH NOSTRILS DAILY.  Marland Kitchen glycopyrrolate (ROBINUL) 1 MG tablet Take 1 tablet (1 mg total) by mouth 2 (two) times daily.  Marland Kitchen levothyroxine (SYNTHROID, LEVOTHROID) 25 MCG tablet Take 1 tablet (25 mcg total) by mouth daily before breakfast.  . Na Sulfate-K Sulfate-Mg Sulf 17.5-3.13-1.6 GM/177ML SOLN Take 1 kit by mouth once for 1 dose.  . saccharomyces boulardii (FLORASTOR) 250 MG capsule Take 1 capsule (250 mg total) by mouth 2  (two) times daily.  . sildenafil (REVATIO) 20 MG tablet Take 2-5 tablets daily as needed for erectile dysfunction  . simvastatin (ZOCOR) 20 MG tablet TAKE 1 TABLET (20 MG TOTAL) BY MOUTH AT BEDTIME.  . vitamin C (ASCORBIC ACID) 500 MG tablet Take 500 mg by mouth 2 (two) times daily.   . vitamin E 400 UNIT capsule Take 400 Units by mouth daily.  . [DISCONTINUED] ciprofloxacin (CIPRO) 500 MG tablet Take 1  tablet (500 mg total) by mouth 2 (two) times daily.  . [DISCONTINUED] metroNIDAZOLE (FLAGYL) 500 MG tablet Take 1 tablet (500 mg total) by mouth 3 (three) times daily. (Patient not taking: Reported on 10/11/2017)  . [DISCONTINUED] metroNIDAZOLE (METROCREAM) 0.75 % cream Apply topically 2 (two) times daily.   No facility-administered encounter medications on file as of 10/11/2017.     Activities of Daily Living In your present state of health, do you have any difficulty performing the following activities: 10/11/2017  Hearing? N  Vision? N  Difficulty concentrating or making decisions? N  Walking or climbing stairs? N  Dressing or bathing? N  Doing errands, shopping? N  Preparing Food and eating ? N  Using the Toilet? N  In the past six months, have you accidently leaked urine? N  Do you have problems with loss of bowel control? N  Managing your Medications? N  Managing your Finances? N  Housekeeping or managing your Housekeeping? N  Some recent data might be hidden    Patient Care Team: Binnie Rail, MD as PCP - General (Internal Medicine)   Assessment:   This is a routine wellness examination for Jeremy Waters. Physical assessment deferred to PCP.    Exercise Activities and Dietary recommendations Current Exercise Habits: Structured exercise class, Type of exercise: walking;treadmill;strength training/weights;calisthenics, Time (Minutes): 60, Frequency (Times/Week): 6, Weekly Exercise (Minutes/Week): 360, Intensity: Mild, Exercise limited by: orthopedic condition(s)  Diet (meal  preparation, eat out, water intake, caffeinated beverages, dairy products, fruits and vegetables): in general, a "healthy" diet  , well balanced, eats a variety of fruits and vegetables daily, limits salt, fat/cholesterol, sugar,carbohydrates,caffeine, drinks 6-8 glasses of water daily.   Goals    . Patient Stated     Enjoy life, help others through church, continue to spend time with family and grandchildren.        Fall Risk Fall Risk  10/11/2017 03/03/2017 09/10/2015 07/02/2014 06/07/2013  Falls in the past year? _0    Depression Screen PHQ 2/9 Scores 10/11/2017 03/03/2017 11/09/2016 09/10/2015  PHQ - 2 Score 0 0 0 0    Cognitive Function MMSE - Mini Mental State Exam 10/11/2017  Orientation to time 5  Orientation to Place 5  Registration 3  Attention/ Calculation 5  Recall 3  Language- name 2 objects 2  Language- repeat 1  Language- follow 3 step command 3  Language- read & follow direction 1  Write a sentence 1  Copy design 1  Total score 30        Immunization History  Administered Date(s) Administered  . Hepatitis A 10/15/1997, 12/04/1997  . Hepatitis B 09/09/2008, 10/15/2008, 08/01/2017  . Influenza Split 03/16/2011, 03/03/2012  . Influenza, High Dose Seasonal PF 02/09/2016, 03/03/2017  . Influenza,inj,Quad PF,6+ Mos 03/08/2014, 02/27/2015  . Influenza-Unspecified 03/07/2013  . Meningococcal Conjugate 09/09/2008  . Pneumococcal Conjugate-13 11/20/2014  . Pneumococcal Polysaccharide-23 06/07/2013  . Tdap 05/24/2008, 08/01/2017  . Typhoid Inactivated 09/04/1997, 07/30/2004, 08/01/2017  . Yellow Fever 09/09/2008  . Zoster 03/18/2011  . Zoster Recombinat (Shingrix) 09/21/2016, 03/03/2017   Screening Tests Health Maintenance  Topic Date Due  . INFLUENZA VACCINE  12/22/2017  . COLONOSCOPY  01/19/2021  . TETANUS/TDAP  08/02/2027  . Hepatitis C Screening  Completed  . PNA vac Low Risk Adult  Completed      Plan:    Continue doing brain stimulating  activities (puzzles, reading, adult coloring books, staying active) to keep memory sharp.   Continue to eat heart healthy  diet (full of fruits, vegetables, whole grains, lean protein, water--limit salt, fat, and sugar intake) and increase physical activity as tolerated.  I have personally reviewed and noted the following in the patient's chart:   . Medical and social history . Use of alcohol, tobacco or illicit drugs  . Current medications and supplements . Functional ability and status . Nutritional status . Physical activity . Advanced directives . List of other physicians . Vitals . Screenings to include cognitive, depression, and falls . Referrals and appointments  In addition, I have reviewed and discussed with patient certain preventive protocols, quality metrics, and best practice recommendations. A written personalized care plan for preventive services as well as general preventive health recommendations were provided to patient.     Michiel Cowboy, RN  10/11/2017   Medical screening examination/treatment/procedure(s) were performed by non-physician practitioner and as supervising physician I was immediately available for consultation/collaboration. I agree with above. Binnie Rail, MD

## 2017-10-12 ENCOUNTER — Telehealth: Payer: Self-pay | Admitting: *Deleted

## 2017-10-12 DIAGNOSIS — E039 Hypothyroidism, unspecified: Secondary | ICD-10-CM

## 2017-10-12 DIAGNOSIS — Z0000 Encounter for general adult medical examination without abnormal findings: Secondary | ICD-10-CM

## 2017-10-12 DIAGNOSIS — M545 Low back pain: Secondary | ICD-10-CM | POA: Diagnosis not present

## 2017-10-12 DIAGNOSIS — E782 Mixed hyperlipidemia: Secondary | ICD-10-CM

## 2017-10-12 DIAGNOSIS — R739 Hyperglycemia, unspecified: Secondary | ICD-10-CM

## 2017-10-12 NOTE — Telephone Encounter (Signed)
Blood work was ordered - he can get it done a few days prior to his CPE

## 2017-10-12 NOTE — Telephone Encounter (Signed)
During AWV, patient requested that PCP order labs for him to be drawn pre-physical so discussion can take place during his visit. Patient states he plans to call and make a physical appointment soon.

## 2017-10-14 NOTE — Telephone Encounter (Signed)
Called and notified patient that PCP order pre-physical labs. Discussed to have them drawn several days before physical visit and lipid panel is a fasting lab.

## 2017-10-17 ENCOUNTER — Encounter: Payer: Self-pay | Admitting: Gastroenterology

## 2017-10-18 ENCOUNTER — Other Ambulatory Visit: Payer: Self-pay | Admitting: Internal Medicine

## 2017-10-20 ENCOUNTER — Encounter: Payer: Self-pay | Admitting: Gastroenterology

## 2017-10-20 ENCOUNTER — Ambulatory Visit (AMBULATORY_SURGERY_CENTER): Payer: PPO | Admitting: Gastroenterology

## 2017-10-20 ENCOUNTER — Other Ambulatory Visit: Payer: Self-pay

## 2017-10-20 VITALS — BP 116/62 | HR 52 | Temp 98.0°F | Resp 11 | Ht 67.0 in | Wt 146.0 lb

## 2017-10-20 DIAGNOSIS — R933 Abnormal findings on diagnostic imaging of other parts of digestive tract: Secondary | ICD-10-CM

## 2017-10-20 DIAGNOSIS — R194 Change in bowel habit: Secondary | ICD-10-CM

## 2017-10-20 DIAGNOSIS — D120 Benign neoplasm of cecum: Secondary | ICD-10-CM | POA: Diagnosis not present

## 2017-10-20 DIAGNOSIS — D123 Benign neoplasm of transverse colon: Secondary | ICD-10-CM | POA: Diagnosis not present

## 2017-10-20 MED ORDER — SODIUM CHLORIDE 0.9 % IV SOLN: 500.0000 mL | Freq: Once | INTRAVENOUS | Status: DC

## 2017-10-20 NOTE — Op Note (Signed)
[facility] Patient Name: Jeremy Waters Procedure Date: 10/20/2017 10:46 AM MRN: [id number] Endoscopist: Ladene Artist , MD Age: 72 Referring MD:  Date of Birth: 06-21-1945 Gender: Male Account #: [id number] Procedure:                Colonoscopy Indications:              Abnormal CT of the GI tract (sigmoid colon), Change                            in bowel habits Medicines:                Monitored Anesthesia Care Procedure:                Pre-Anesthesia Assessment:                           - Prior to the procedure, a History and Physical                            was performed, and patient medications and                            allergies were reviewed. The patient's tolerance of                            previous anesthesia was also reviewed. The risks                            and benefits of the procedure and the sedation                            options and risks were discussed with the patient.                            All questions were answered, and informed consent                            was obtained. Prior Anticoagulants: The patient has                            taken no previous anticoagulant or antiplatelet                            agents. ASA Grade Assessment: II - A patient with                            mild systemic disease. After reviewing the risks                            and benefits, the patient was deemed in                            satisfactory condition to undergo the procedure.  After obtaining informed consent, the colonoscope                            was passed under direct vision. Throughout the                            procedure, the patient's blood pressure, pulse, and                            oxygen saturations were monitored continuously. The                            Colonoscope was introduced through the anus and                            advanced to the the cecum, identified by                           appendiceal orifice and ileocecal valve. The                            ileocecal valve, appendiceal orifice, and rectum                            were photographed. The quality of the bowel                            preparation was good. The patient tolerated the                            procedure well. The colonoscopy was somewhat                            difficult due to a tortuous colon. Scope In: 10:49:04 AM Scope Out: 11:14:28 AM Scope Withdrawal Time: 0 hours 15 minutes 42 seconds  Total Procedure Duration: 0 hours 25 minutes 24 seconds  Findings:                 The perianal and digital rectal examinations were                            normal.                           Four sessile polyps were found in the transverse                            colon (3) and cecum (1) The polyps were 6 to 8 mm                            in size. These polyps were removed with a cold                            snare. Resection and retrieval were complete.  A 4 mm polyp was found in the transverse colon. The                            polyp was sessile. The polyp was removed with a                            cold biopsy forceps. Resection and retrieval were                            complete.                           Many small-mouthed diverticula were found in the                            left colon. There was no evidence of diverticular                            bleeding.                           Internal hemorrhoids were found during                            retroflexion. The hemorrhoids were small and Grade                            I (internal hemorrhoids that do not prolapse).                           The exam was otherwise without abnormality on                            direct and retroflexion views. Complications:            No immediate complications. Estimated blood loss:                            None. Estimated Blood  Loss:     Estimated blood loss: none. Impression:               - Four 6 to 8 mm polyps in the transverse colon and                            in the cecum.                           - One 4 mm polyp in the transverse colon, removed                            with a cold biopsy forceps. Resected and retrieved.                           - Mild diverticulosis in the left colon. There was  no evidence of diverticular bleeding.                           - Internal hemorrhoids.                           - The examination was otherwise normal on direct                            and retroflexion views. Recommendation:           - Repeat colonoscopy in 3 - 5 years for                            surveillance pending path review.                           - Patient has a contact number available for                            emergencies. The signs and symptoms of potential                            delayed complications were discussed with the                            patient. Return to normal activities tomorrow.                            Written discharge instructions were provided to the                            patient.                           - High fiber diet.                           - Continue present medications.                           - Await pathology results. Ladene Artist, MD 10/20/2017 11:20:46 AM This report has been signed electronically.

## 2017-10-20 NOTE — Patient Instructions (Signed)
  Thank you for allowing [place] to care for you today!  Await pathology results by mail, approximately 2 weeks.  Repeat colonoscopy in 3-5 years depending on pathology results.  Follow a high fiber diet.      YOU HAD AN ENDOSCOPIC PROCEDURE TODAY AT [facility]:   Refer to the procedure report that was given to you for any specific questions about what was found during the examination.  If the procedure report does not answer your questions, please call your gastroenterologist to clarify.  If you requested that your care partner not be given the details of your procedure findings, then the procedure report has been included in a sealed envelope for you to review at your convenience later.  YOU SHOULD EXPECT: Some feelings of bloating in the abdomen. Passage of more gas than usual.  Walking can help get rid of the air that was put into your GI tract during the procedure and reduce the bloating. If you had a lower endoscopy (such as a colonoscopy or flexible sigmoidoscopy) you may notice spotting of blood in your stool or on the toilet paper. If you underwent a bowel prep for your procedure, you may not have a normal bowel movement for a few days.  Please Note:  You might notice some irritation and congestion in your nose or some drainage.  This is from the oxygen used during your procedure.  There is no need for concern and it should clear up in a day or so.  SYMPTOMS TO REPORT IMMEDIATELY:   Following lower endoscopy (colonoscopy or flexible sigmoidoscopy):  Excessive amounts of blood in the stool  Significant tenderness or worsening of abdominal pains  Swelling of the abdomen that is new, acute  Fever of 100F or higher    For urgent or emergent issues, a gastroenterologist can be reached at any hour by calling [phone].   DIET:  We do recommend a small meal at first, but then you may proceed to your regular diet.  Drink plenty of fluids but you should avoid  alcoholic beverages for 24 hours.  ACTIVITY:  You should plan to take it easy for the rest of today and you should NOT DRIVE or use heavy machinery until tomorrow (because of the sedation medicines used during the test).    FOLLOW UP: Our staff will call the number listed on your records the next business day following your procedure to check on you and address any questions or concerns that you may have regarding the information given to you following your procedure. If we do not reach you, we will leave a message.  However, if you are feeling well and you are not experiencing any problems, there is no need to return our call.  We will assume that you have returned to your regular daily activities without incident.  If any biopsies were taken you will be contacted by phone or by letter within the next 1-3 weeks.  Please call us at [phone] if you have not heard about the biopsies in 3 weeks.    SIGNATURES/CONFIDENTIALITY: You and/or your care partner have signed paperwork which will be entered into your electronic medical record.  These signatures attest to the fact that that the information above on your After Visit Summary has been reviewed and is understood.  Full responsibility of the confidentiality of this discharge information lies with you and/or your care-partner.

## 2017-10-20 NOTE — Progress Notes (Signed)
Called to room to assist during endoscopic procedure.  Patient ID and intended procedure confirmed with present staff. Received instructions for my participation in the procedure from the performing physician.  

## 2017-10-20 NOTE — Progress Notes (Signed)
To PACU VSS. Report to [email or web address] 

## 2017-10-21 ENCOUNTER — Telehealth: Payer: Self-pay | Admitting: *Deleted

## 2017-10-21 NOTE — Telephone Encounter (Signed)
  Follow up Call-  Call back number 10/20/2017 01/20/2016  Post procedure Call Back phone  # [phone]  Permission to leave phone message Yes Yes  Some recent data might be hidden     Patient questions:  Do you have a fever, pain , or abdominal swelling? No. Pain Score  0 *  Have you tolerated food without any problems? Yes.    Have you been able to return to your normal activities? Yes.    Do you have any questions about your discharge instructions: Diet   No. Medications  No. Follow up visit  No.  Do you have questions or concerns about your Care? No.  Actions: * If pain score is 4 or above: No action needed, pain <4.

## 2017-10-26 ENCOUNTER — Encounter: Payer: Self-pay | Admitting: Gastroenterology

## 2017-10-27 ENCOUNTER — Encounter: Payer: Self-pay | Admitting: Gastroenterology

## 2017-10-31 ENCOUNTER — Encounter: Payer: Self-pay | Admitting: Gastroenterology

## 2017-11-02 ENCOUNTER — Other Ambulatory Visit: Payer: Self-pay | Admitting: Internal Medicine

## 2017-11-02 DIAGNOSIS — M545 Low back pain: Secondary | ICD-10-CM | POA: Diagnosis not present

## 2017-11-02 MED ORDER — CLONAZEPAM 0.5 MG PO TABS: 0.5000 mg | ORAL_TABLET | Freq: Every evening | ORAL | 0 refills | Status: DC | PRN

## 2017-11-02 NOTE — Telephone Encounter (Signed)
Copied from [place] [id number]. Topic: Quick Communication - Rx Refill/Question >> Nov 02, 2017 11:23 AM Percell Belt A wrote: Medication:  clonazePAM (KLONOPIN) 0.5 MG tablet [[id number]]- [place] [address]  simvastatin (ZOCOR) 20 MG tablet [[id number]] - [facility] - [place], [place] [phone] (Phone)  levothyroxine (Wilmer Floor, LEVOTHROID) 25 MCG tablet [[id number]]-[id number] - [place], [place] [phone] (Phone)   Has the patient contacted their pharmacy? Yes  (Agent: If no, request that the patient contact the pharmacy for the refill.) (Agent: If yes, when and what did the pharmacy advise?)  Preferred Pharmacy (with phone number or street name): [facility]   Agent: Please be advised that RX refills may take up to 3 business days. We ask that you follow-up with your pharmacy.

## 2017-11-02 NOTE — Telephone Encounter (Signed)
[place] Controlled Substance Database checked. Last filled on 07/20/17 

## 2017-11-02 NOTE — Telephone Encounter (Signed)
[place] Controlled Substance Database checked. Last filled on 07/20/17

## 2017-11-02 NOTE — Telephone Encounter (Signed)
Klonopin 0.5 mg refill request   For pharmacy [facility] [address].  Last refill:  07/22/17  #30   0 refills  Zocor 20 mg refill request   For [facility] [place], [place].  Synthroid 25 mcg  refilll request also with [facility]  LOV 08/24/17 with Dr. Quay Burow.

## 2017-11-04 ENCOUNTER — Other Ambulatory Visit: Payer: Self-pay | Admitting: Internal Medicine

## 2017-11-09 DIAGNOSIS — M545 Low back pain: Secondary | ICD-10-CM | POA: Diagnosis not present

## 2017-11-10 DIAGNOSIS — D225 Melanocytic nevi of trunk: Secondary | ICD-10-CM | POA: Diagnosis not present

## 2017-11-10 DIAGNOSIS — D2261 Melanocytic nevi of right upper limb, including shoulder: Secondary | ICD-10-CM | POA: Diagnosis not present

## 2017-11-10 DIAGNOSIS — D2262 Melanocytic nevi of left upper limb, including shoulder: Secondary | ICD-10-CM | POA: Diagnosis not present

## 2017-11-10 DIAGNOSIS — L821 Other seborrheic keratosis: Secondary | ICD-10-CM | POA: Diagnosis not present

## 2017-11-10 DIAGNOSIS — Z85828 Personal history of other malignant neoplasm of skin: Secondary | ICD-10-CM | POA: Diagnosis not present

## 2017-11-10 DIAGNOSIS — L814 Other melanin hyperpigmentation: Secondary | ICD-10-CM | POA: Diagnosis not present

## 2017-11-29 DIAGNOSIS — M545 Low back pain: Secondary | ICD-10-CM | POA: Diagnosis not present

## 2017-11-30 ENCOUNTER — Other Ambulatory Visit (INDEPENDENT_AMBULATORY_CARE_PROVIDER_SITE_OTHER): Payer: PPO

## 2017-11-30 DIAGNOSIS — Z0000 Encounter for general adult medical examination without abnormal findings: Secondary | ICD-10-CM | POA: Diagnosis not present

## 2017-11-30 DIAGNOSIS — R739 Hyperglycemia, unspecified: Secondary | ICD-10-CM | POA: Diagnosis not present

## 2017-11-30 DIAGNOSIS — E782 Mixed hyperlipidemia: Secondary | ICD-10-CM

## 2017-11-30 DIAGNOSIS — E039 Hypothyroidism, unspecified: Secondary | ICD-10-CM

## 2017-11-30 LAB — CBC WITH DIFFERENTIAL/PLATELET
Basophils Absolute: 0.1 10*3/uL (ref 0.0–0.1)
Basophils Relative: 1 % (ref 0.0–3.0)
Eosinophils Absolute: 0.1 10*3/uL (ref 0.0–0.7)
Eosinophils Relative: 1.9 % (ref 0.0–5.0)
HCT: 41.2 % (ref 39.0–52.0)
Hemoglobin: 14.2 g/dL (ref 13.0–17.0)
Lymphocytes Relative: 16.9 % (ref 12.0–46.0)
Lymphs Abs: 1.3 10*3/uL (ref 0.7–4.0)
MCHC: 34.5 g/dL (ref 30.0–36.0)
MCV: 96.6 fl (ref 78.0–100.0)
Monocytes Absolute: 0.7 10*3/uL (ref 0.1–1.0)
Monocytes Relative: 9.3 % (ref 3.0–12.0)
Neutro Abs: 5.4 10*3/uL (ref 1.4–7.7)
Neutrophils Relative %: 70.9 % (ref 43.0–77.0)
Platelets: 249 10*3/uL (ref 150.0–400.0)
RBC: 4.26 Mil/uL (ref 4.22–5.81)
RDW: 14.6 % (ref 11.5–15.5)
WBC: 7.7 10*3/uL (ref 4.0–10.5)

## 2017-11-30 LAB — LIPID PANEL
Cholesterol: 205 mg/dL — ABNORMAL HIGH (ref 0–200)
HDL: 77.2 mg/dL (ref >39.00)
LDL Cholesterol: 113 mg/dL — ABNORMAL HIGH (ref 0–99)
NonHDL: 128.13
Total CHOL/HDL Ratio: 3
Triglycerides: 76 mg/dL (ref 0.0–149.0)
VLDL: 15.2 mg/dL (ref 0.0–40.0)

## 2017-11-30 LAB — HEMOGLOBIN A1C: Hgb A1c MFr Bld: 5.5 % (ref 4.6–6.5)

## 2017-11-30 LAB — COMPREHENSIVE METABOLIC PANEL
ALT: 19 U/L (ref 0–53)
AST: 27 U/L (ref 0–37)
Albumin: 4.4 g/dL (ref 3.5–5.2)
Alkaline Phosphatase: 48 U/L (ref 39–117)
BUN: 16 mg/dL (ref 6–23)
CO2: 25 mEq/L (ref 19–32)
Calcium: 9.5 mg/dL (ref 8.4–10.5)
Chloride: 106 mEq/L (ref 96–112)
Creatinine, Ser: 0.86 mg/dL (ref 0.40–1.50)
GFR: 93.24 mL/min (ref >60.00)
Glucose, Bld: 84 mg/dL (ref 70–99)
Potassium: 4.5 mEq/L (ref 3.5–5.1)
Sodium: 141 mEq/L (ref 135–145)
Total Bilirubin: 0.6 mg/dL (ref 0.2–1.2)
Total Protein: 7.5 g/dL (ref 6.0–8.3)

## 2017-11-30 LAB — TSH: TSH: 3.59 u[IU]/mL (ref 0.35–4.50)

## 2017-12-07 DIAGNOSIS — M13842 Other specified arthritis, left hand: Secondary | ICD-10-CM | POA: Diagnosis not present

## 2017-12-07 DIAGNOSIS — M79645 Pain in left finger(s): Secondary | ICD-10-CM | POA: Diagnosis not present

## 2017-12-17 NOTE — Progress Notes (Signed)
Subjective:    Patient ID: Jeremy Waters, male    DOB: [birth year], 72 y.o.   MRN: [id number]  HPI Here for an annual physical exam.   He denies any changes in his health and has no concerns.    He has noticed worsening of his balance - it is mild.  He exercises regularly.     Medications and allergies reviewed with patient and updated if appropriate.  Patient Active Problem List   Diagnosis Date Noted  . Left lower quadrant pain 08/24/2017  . Right cervical radiculopathy 03/03/2017  . Carotid artery disease ([place]) 09/15/2016  . Spinal stenosis, lumbar 10/08/2015  . Sensorineural hearing loss of both ears 04/10/2014  . Organic impotence 04/10/2014  . Hyperglycemia 04/02/2014  . S/P left TKA 07/03/2013  . Left knee DJD 06/07/2013  . Right carotid bruit 06/07/2013  . Hypothyroidism 03/03/2012  . Axillary mass, right 04/07/2011  . ROSACEA 03/19/2010  . ARTHRALGIA 03/19/2010  . MYALGIA 03/19/2010  . CERVICALGIA 09/05/2009  . CARPAL TUNNEL SYNDROME 06/17/2008  . DEGENERATIVE JOINT DISEASE, GENERALIZED 06/17/2008  . CERVICAL RADICULOPATHY, LEFT 06/17/2008  . Disturbance in sleep behavior 03/04/2008  . PROSTATE CANCER, HX OF 03/04/2008  . Hyperlipidemia 06/15/2007  . DIVERTICULOSIS, COLON W/O HEM 03/03/2007    Current Outpatient Medications on File Prior to Visit  Medication Sig Dispense Refill  . beta carotene 25000 UNIT capsule Take 25,000 Units by mouth daily.    . clonazePAM (KLONOPIN) 0.5 MG tablet Take 1 tablet (0.5 mg total) by mouth at bedtime as needed. 30 tablet 0  . fluticasone (FLONASE) 50 MCG/ACT nasal spray Place 1 spray into both nostrils daily. -- Office visit needed for further refills 16 g 0  . glycopyrrolate (ROBINUL) 1 MG tablet Take 1 tablet (1 mg total) by mouth 2 (two) times daily. 60 tablet 11  . levothyroxine (SYNTHROID, LEVOTHROID) 25 MCG tablet Take 1 tablet by mouth daily before breakfast 90 tablet 0  . saccharomyces boulardii (FLORASTOR) 250  MG capsule Take 1 capsule (250 mg total) by mouth 2 (two) times daily. 60 capsule 11  . sildenafil (REVATIO) 20 MG tablet Take 2-5 tablets daily as needed for erectile dysfunction    . simvastatin (ZOCOR) 20 MG tablet Take 1 tablet by mouth at bedtime 90 tablet 0  . vitamin C (ASCORBIC ACID) 500 MG tablet Take 500 mg by mouth 2 (two) times daily.     . vitamin E 400 UNIT capsule Take 400 Units by mouth daily.     No current facility-administered medications on file prior to visit.     Past Medical History:  Diagnosis Date  . Adenomatous colon polyp 09/2007  . Arthritis    OA / PAIN LEFT KNEE  . Diverticulosis of colon    w/o hemorrage  . Heart murmur    TOLD HE HAS A SLIGHT MURMUR  . Hyperlipidemia   . Inguinal hernia    left side   . Prostate cancer ([place])   . Thyroid disease     Past Surgical History:  Procedure Laterality Date  . BACK SURGERY  05/12/2005   L5  . COLONOSCOPY  1999   Negative; Dr Fuller Plan  . COLONOSCOPY  2004   tics, hemorrhoids  . COLONOSCOPY  2009   polyps ([place]  . HERNIA REPAIR  1987  . HERNIA REPAIR  10/2003  . KNEE SURGERY  1969   Patellar fracture fragment Lt  . LAMINECTOMY  2000   L4-5  . LUMBAR  LAMINECTOMY/DECOMPRESSION MICRODISCECTOMY Right 10/08/2015   Procedure: MICRO LUMBAR DECOMPRESSION L4-L5 AND L5-S1 ON RIGHT ;  Surgeon: Susa Day, MD;  Location: [facility];  Service: Orthopedics;  Laterality: Right;  . MASS EXCISION  04/26/2011   Procedure: MINOR EXCISION OF MASS;  Surgeon: Earnstine Regal, MD;  Location: [facility];  Service: General;  Laterality: Right;  Excise subcutaneous mass right axilla Minor Room  . PROSTATECTOMY  04/16/2005  . TONSILLECTOMY    . TOTAL KNEE ARTHROPLASTY Left 07/03/2013   Procedure: LEFT TOTAL KNEE ARTHROPLASTY;  Surgeon: Mauri Pole, MD;  Location: [facility];  Service: Orthopedics;  Laterality: Left;    Social History   Socioeconomic History  . Marital status: Married    Spouse name: Not on file    . Number of children: 2  . Years of education: Not on file  . Highest education level: Not on file  Occupational History  . Occupation: Nurse, mental health: [employer]  Social Needs  . Financial resource strain: Not hard at all  . Food insecurity:    Worry: Never true    Inability: Never true  . Transportation needs:    Medical: No    Non-medical: No  Tobacco Use  . Smoking status: Former Research scientist (life sciences)  . Smokeless tobacco: Never Used  . Tobacco comment: stopped smoking cigarettes 1983, 1 cigar /day 2001-2013  Substance and Sexual Activity  . Alcohol use: Yes    Alcohol/week: 4.2 oz    Types: 7 Standard drinks or equivalent per week    Comment: scotch or wine daily  . Drug use: No  . Sexual activity: Yes  Lifestyle  . Physical activity:    Days per week: 6 days    Minutes per session: 70 min  . Stress: Not at all  Relationships  . Social connections:    Talks on phone: More than three times a week    Gets together: More than three times a week    Attends religious service: More than 4 times per year    Active member of club or organization: Yes    Attends meetings of clubs or organizations: More than 4 times per year    Relationship status: Married  Other Topics Concern  . Not on file  Social History Narrative   Exercises daily             Family History  Problem Relation Age of Onset  . Lung cancer Mother   . COPD Mother   . Cancer Father        Bladder Cancer  . Prostate cancer Father   . Colon cancer Father 20  . Urolithiasis Father   . Hypertension Paternal Uncle   . Alcohol abuse Paternal Uncle   . Stroke Paternal Uncle 27  . Lung cancer Sister   . Asthma Neg Hx   . Heart disease Neg Hx     Review of Systems  Constitutional: Negative for appetite change, chills, fatigue and fever.  Eyes: Negative for visual disturbance.  Respiratory: Negative for cough, shortness of breath and wheezing.   Cardiovascular: Negative for chest pain,  palpitations and leg swelling.  Gastrointestinal: Negative for abdominal pain, blood in stool, constipation, diarrhea and nausea.       Occ GERD  Genitourinary: Negative for difficulty urinating, dysuria and hematuria.  Musculoskeletal: Positive for arthralgias (left thumb, knee).  Skin: Negative for color change and rash.  Neurological: Negative for light-headedness and headaches.  Psychiatric/Behavioral:  Negative for dysphoric mood. The patient is not nervous/anxious.        Objective:   Vitals:   12/19/17 1412  BP: 120/74  Pulse: (!) 57  Resp: 16  Temp: 98.2 F (36.8 C)  SpO2: 98%   Filed Weights   12/19/17 1412  Weight: 149 lb (67.6 kg)   Body mass index is 23.34 kg/m.  Wt Readings from Last 3 Encounters:  12/19/17 149 lb (67.6 kg)  10/20/17 146 lb (66.2 kg)  10/11/17 146 lb (66.2 kg)     Physical Exam Constitutional: He appears well-developed and well-nourished. No distress.  HENT:  Head: Normocephalic and atraumatic.  Right Ear: External ear normal.  Left Ear: External ear normal.  Mouth/Throat: Oropharynx is clear and moist.  Normal ear canals and TM b/l  Eyes: Conjunctivae and EOM are normal.  Neck: Neck supple. No tracheal deviation present. No thyromegaly present.  No carotid bruit  Cardiovascular: Normal rate, regular rhythm, normal heart sounds and intact distal pulses.   No murmur heard. Pulmonary/Chest: Effort normal and breath sounds normal. No respiratory distress. He has no wheezes. He has no rales.  Abdominal: Soft. He exhibits no distension. There is no tenderness.  Genitourinary: deferred  Musculoskeletal: He exhibits no edema.  Lymphadenopathy:   He has no cervical adenopathy.  Skin: Skin is warm and dry. He is not diaphoretic.  Psychiatric: He has a normal mood and affect. His behavior is normal.         Assessment & Plan:    Physical exam: Screening blood work   reviewed Immunizations   Up to date,  had shingrix Colonoscopy     Up to date  Eye exams   Up to date  EKG    Done 09/2015 Exercise - regular exercise with good variety - walking, elliptical, weights, core Weight   -  Normal BMI Skin     no concerns, sees Dr Ronnald Ramp Substance abuse   none  See Problem List for Assessment and Plan of chronic medical problems.

## 2017-12-17 NOTE — Patient Instructions (Addendum)
We reviewed your blood work.    All other Health Maintenance issues reviewed.   All recommended immunizations and age-appropriate screenings are up-to-date or discussed.  No immunizations administered today.   Medications reviewed and updated.  No changes recommended at this time.    A carotid US was ordered  Please followup in one year    Health Maintenance, Male A healthy lifestyle and preventive care is important for your health and wellness. Ask your health care provider about what schedule of regular examinations is right for you. What should I know about weight and diet? Eat a Healthy Diet  Eat plenty of vegetables, fruits, whole grains, low-fat dairy products, and lean protein.  Do not eat a lot of foods high in solid fats, added sugars, or salt.  Maintain a Healthy Weight Regular exercise can help you achieve or maintain a healthy weight. You should:  Do at least 150 minutes of exercise each week. The exercise should increase your heart rate and make you sweat (moderate-intensity exercise).  Do strength-training exercises at least twice a week.  Watch Your Levels of Cholesterol and Blood Lipids  Have your blood tested for lipids and cholesterol every 5 years starting at 71 years of age. If you are at high risk for heart disease, you should start having your blood tested when you are 71 years old. You may need to have your cholesterol levels checked more often if: ? Your lipid or cholesterol levels are high. ? You are older than 71 years of age. ? You are at high risk for heart disease.  What should I know about cancer screening? Many types of cancers can be detected early and may often be prevented. Lung Cancer  You should be screened every year for lung cancer if: ? You are a current smoker who has smoked for at least 30 years. ? You are a former smoker who has quit within the past 15 years.  Talk to your health care provider about your screening options,  when you should start screening, and how often you should be screened.  Colorectal Cancer  Routine colorectal cancer screening usually begins at 71 years of age and should be repeated every 5-10 years until you are 71 years old. You may need to be screened more often if early forms of precancerous polyps or small growths are found. Your health care provider may recommend screening at an earlier age if you have risk factors for colon cancer.  Your health care provider may recommend using home test kits to check for hidden blood in the stool.  A small camera at the end of a tube can be used to examine your colon (sigmoidoscopy or colonoscopy). This checks for the earliest forms of colorectal cancer.  Prostate and Testicular Cancer  Depending on your age and overall health, your health care provider may do certain tests to screen for prostate and testicular cancer.  Talk to your health care provider about any symptoms or concerns you have about testicular or prostate cancer.  Skin Cancer  Check your skin from head to toe regularly.  Tell your health care provider about any new moles or changes in moles, especially if: ? There is a change in a mole's size, shape, or color. ? You have a mole that is larger than a pencil eraser.  Always use sunscreen. Apply sunscreen liberally and repeat throughout the day.  Protect yourself by wearing long sleeves, pants, a wide-brimmed hat, and sunglasses when outside.  What should  I know about heart disease, diabetes, and high blood pressure?  If you are 59-35 years of age, have your blood pressure checked every 3-5 years. If you are 63 years of age or older, have your blood pressure checked every year. You should have your blood pressure measured twice-once when you are at a hospital or clinic, and once when you are not at a hospital or clinic. Record the average of the two measurements. To check your blood pressure when you are not at a hospital or  clinic, you can use: ? An automated blood pressure machine at a pharmacy. ? A home blood pressure monitor.  Talk to your health care provider about your target blood pressure.  If you are between 30-70 years old, ask your health care provider if you should take aspirin to prevent heart disease.  Have regular diabetes screenings by checking your fasting blood sugar level. ? If you are at a normal weight and have a low risk for diabetes, have this test once every three years after the age of 28. ? If you are overweight and have a high risk for diabetes, consider being tested at a younger age or more often.  A one-time screening for abdominal aortic aneurysm (AAA) by ultrasound is recommended for men aged 45-75 years who are current or former smokers. What should I know about preventing infection? Hepatitis B If you have a higher risk for hepatitis B, you should be screened for this virus. Talk with your health care provider to find out if you are at risk for hepatitis B infection. Hepatitis C Blood testing is recommended for:  Everyone born from [birth year] through 1964.  Anyone with known risk factors for hepatitis C.  Sexually Transmitted Diseases (STDs)  You should be screened each year for STDs including gonorrhea and chlamydia if: ? You are sexually active and are younger than 71 years of age. ? You are older than 70 years of age and your health care provider tells you that you are at risk for this type of infection. ? Your sexual activity has changed since you were last screened and you are at an increased risk for chlamydia or gonorrhea. Ask your health care provider if you are at risk.  Talk with your health care provider about whether you are at high risk of being infected with HIV. Your health care provider may recommend a prescription medicine to help prevent HIV infection.  What else can I do?  Schedule regular health, dental, and eye exams.  Stay current with your vaccines  (immunizations).  Do not use any tobacco products, such as cigarettes, chewing tobacco, and e-cigarettes. If you need help quitting, ask your health care provider.  Limit alcohol intake to no more than 2 drinks per day. One drink equals 12 ounces of beer, 5 ounces of wine, or 1 ounces of hard liquor.  Do not use street drugs.  Do not share needles.  Ask your health care provider for help if you need support or information about quitting drugs.  Tell your health care provider if you often feel depressed.  Tell your health care provider if you have ever been abused or do not feel safe at home. This information is not intended to replace advice given to you by your health care provider. Make sure you discuss any questions you have with your health care provider. Document Released: 11/06/2007 Document Revised: 01/07/2016 Document Reviewed: 02/11/2015 Elsevier Interactive Patient Education  [employer].

## 2017-12-19 ENCOUNTER — Encounter: Payer: Self-pay | Admitting: Internal Medicine

## 2017-12-19 ENCOUNTER — Ambulatory Visit (INDEPENDENT_AMBULATORY_CARE_PROVIDER_SITE_OTHER): Payer: PPO | Admitting: Internal Medicine

## 2017-12-19 ENCOUNTER — Encounter

## 2017-12-19 ENCOUNTER — Encounter: Payer: PPO | Admitting: Internal Medicine

## 2017-12-19 VITALS — BP 120/74 | HR 57 | Temp 98.2°F | Resp 16 | Wt 149.0 lb

## 2017-12-19 DIAGNOSIS — G479 Sleep disorder, unspecified: Secondary | ICD-10-CM | POA: Diagnosis not present

## 2017-12-19 DIAGNOSIS — E039 Hypothyroidism, unspecified: Secondary | ICD-10-CM

## 2017-12-19 DIAGNOSIS — R739 Hyperglycemia, unspecified: Secondary | ICD-10-CM

## 2017-12-19 DIAGNOSIS — E782 Mixed hyperlipidemia: Secondary | ICD-10-CM

## 2017-12-19 DIAGNOSIS — I6523 Occlusion and stenosis of bilateral carotid arteries: Secondary | ICD-10-CM

## 2017-12-19 DIAGNOSIS — Z0000 Encounter for general adult medical examination without abnormal findings: Secondary | ICD-10-CM | POA: Diagnosis not present

## 2017-12-19 NOTE — Assessment & Plan Note (Signed)
a1c in normal range

## 2017-12-19 NOTE — Assessment & Plan Note (Signed)
Will order [place] to recheck

## 2017-12-19 NOTE — Assessment & Plan Note (Signed)
tsh in normal range Continue current dose of medication 

## 2017-12-19 NOTE — Assessment & Plan Note (Signed)
Lipids slightly elevated compared to past Continue statin Continue regular exercise Revise diet

## 2017-12-19 NOTE — Assessment & Plan Note (Addendum)
Takes 1/2 clonazpeam 2/week No side effects continue

## 2017-12-20 ENCOUNTER — Other Ambulatory Visit: Payer: Self-pay | Admitting: Internal Medicine

## 2017-12-22 DIAGNOSIS — M545 Low back pain: Secondary | ICD-10-CM | POA: Diagnosis not present

## 2018-01-09 ENCOUNTER — Ambulatory Visit (HOSPITAL_COMMUNITY)
Admission: RE | Admit: 2018-01-09 | Discharge: 2018-01-09 | Disposition: A | Discharge status: Home / Self Care | Payer: PPO | Source: Ambulatory Visit | Attending: Cardiovascular Disease | Admitting: Cardiovascular Disease

## 2018-01-09 DIAGNOSIS — I6523 Occlusion and stenosis of bilateral carotid arteries: Secondary | ICD-10-CM | POA: Diagnosis not present

## 2018-01-10 DIAGNOSIS — M545 Low back pain: Secondary | ICD-10-CM | POA: Diagnosis not present

## 2018-01-12 ENCOUNTER — Encounter: Payer: Self-pay | Admitting: Internal Medicine

## 2018-01-18 ENCOUNTER — Other Ambulatory Visit: Payer: Self-pay | Admitting: Pharmacist

## 2018-01-18 NOTE — Patient Outreach (Signed)
[facility]) Care Management  01/18/2018  Jeremy Waters Dec 19, 1945 [id number]   Incoming call from [place] in response to the [facility] Medication Adherence Campaign. Speak with patient. HIPAA identifiers verified and verbal consent received.  [name] reports that he is taking his simvastatin each night as directed. Denies any missed doses or barriers to adherence. Reports that he has been getting this medication refilled for a 90 day supply through [employer] order. Reports that he uses a weekly pillbox to organize his medications.   Patient denies any medication questions or concerns at this time. Will close pharmacy episode.  Harlow Asa, PharmD, [place] Management [phone]

## 2018-01-25 DIAGNOSIS — M545 Low back pain: Secondary | ICD-10-CM | POA: Diagnosis not present

## 2018-02-08 ENCOUNTER — Other Ambulatory Visit: Payer: Self-pay

## 2018-02-08 ENCOUNTER — Other Ambulatory Visit: Payer: Self-pay | Admitting: Internal Medicine

## 2018-02-08 DIAGNOSIS — M545 Low back pain: Secondary | ICD-10-CM | POA: Diagnosis not present

## 2018-02-08 MED ORDER — CLONAZEPAM 0.5 MG PO TABS: 0.5000 mg | ORAL_TABLET | Freq: Every evening | ORAL | 0 refills | Status: DC | PRN

## 2018-02-08 MED ORDER — LEVOTHYROXINE SODIUM 25 MCG PO TABS: ORAL_TABLET | ORAL | 2 refills | Status: DC

## 2018-02-08 MED ORDER — SIMVASTATIN 20 MG PO TABS: ORAL_TABLET | ORAL | 2 refills | Status: DC

## 2018-02-08 NOTE — Telephone Encounter (Signed)
Last refill was 11/03/17 Last OV was 12/19/17 Next OV is not on file.

## 2018-02-22 DIAGNOSIS — M545 Low back pain: Secondary | ICD-10-CM | POA: Diagnosis not present

## 2018-03-13 DIAGNOSIS — M545 Low back pain: Secondary | ICD-10-CM | POA: Diagnosis not present

## 2018-03-28 DIAGNOSIS — M545 Low back pain: Secondary | ICD-10-CM | POA: Diagnosis not present

## 2018-04-26 DIAGNOSIS — M545 Low back pain: Secondary | ICD-10-CM | POA: Diagnosis not present

## 2018-05-10 DIAGNOSIS — M545 Low back pain: Secondary | ICD-10-CM | POA: Diagnosis not present

## 2018-05-19 ENCOUNTER — Other Ambulatory Visit: Payer: Self-pay | Admitting: Internal Medicine

## 2018-05-31 DIAGNOSIS — M545 Low back pain: Secondary | ICD-10-CM | POA: Diagnosis not present

## 2018-06-01 DIAGNOSIS — Z8546 Personal history of malignant neoplasm of prostate: Secondary | ICD-10-CM | POA: Diagnosis not present

## 2018-06-03 ENCOUNTER — Other Ambulatory Visit: Payer: Self-pay | Admitting: Internal Medicine

## 2018-06-05 NOTE — Telephone Encounter (Signed)
Last OV 12/19/2017  [place] Controlled Substance Database checked. Last filled on 02/09/2018

## 2018-06-08 ENCOUNTER — Other Ambulatory Visit: Payer: Self-pay | Admitting: Internal Medicine

## 2018-06-09 ENCOUNTER — Encounter: Payer: Self-pay | Admitting: Internal Medicine

## 2018-06-09 MED ORDER — CLONAZEPAM 0.5 MG PO TABS: 0.5000 mg | ORAL_TABLET | Freq: Every evening | ORAL | 0 refills | Status: DC | PRN

## 2018-06-09 NOTE — Telephone Encounter (Signed)
[place] Controlled Substance Database checked. Last filled on 02/09/18  Last OV 12/19/17

## 2018-06-09 NOTE — Telephone Encounter (Signed)
Pt sent MyChart message stating [facility] did not receive RX on 06/05/17. Please resend.

## 2018-06-09 NOTE — Addendum Note (Signed)
Addended by: Binnie Rail on: 06/09/2018 04:31 PM   Modules accepted: Orders

## 2018-06-09 NOTE — Addendum Note (Signed)
Addended by: Delice Bison E on: 06/09/2018 03:05 PM   Modules accepted: Orders

## 2018-06-09 NOTE — Telephone Encounter (Signed)
Lovena Le wrenn called [facility] - med already there - she noticed pt.  Refuse Med

## 2018-06-09 NOTE — Addendum Note (Signed)
Addended by: Binnie Rail on: 06/09/2018 12:18 PM   Modules accepted: Orders

## 2018-06-09 NOTE — Addendum Note (Signed)
Addended by: Terence Lux B on: 06/09/2018 12:03 PM   Modules accepted: Orders

## 2018-06-09 NOTE — Telephone Encounter (Signed)
Pt states he didn't pick this medication up before leaving town.  Now pt is in [place] and can't get the medication transferred to the pharmacy there - they are stating they need a new script.  Pt wants to know if his medication can be called to  [facility], [address] [phone] (Phone) [phone] (Fax)

## 2018-06-21 DIAGNOSIS — M545 Low back pain: Secondary | ICD-10-CM | POA: Diagnosis not present

## 2018-07-11 DIAGNOSIS — H35342 Macular cyst, hole, or pseudohole, left eye: Secondary | ICD-10-CM | POA: Diagnosis not present

## 2018-07-11 DIAGNOSIS — H524 Presbyopia: Secondary | ICD-10-CM | POA: Diagnosis not present

## 2018-07-11 DIAGNOSIS — H25813 Combined forms of age-related cataract, bilateral: Secondary | ICD-10-CM | POA: Diagnosis not present

## 2018-07-12 DIAGNOSIS — M545 Low back pain: Secondary | ICD-10-CM | POA: Diagnosis not present

## 2018-07-17 DIAGNOSIS — H43813 Vitreous degeneration, bilateral: Secondary | ICD-10-CM | POA: Diagnosis not present

## 2018-07-17 DIAGNOSIS — H35342 Macular cyst, hole, or pseudohole, left eye: Secondary | ICD-10-CM | POA: Diagnosis not present

## 2018-07-17 DIAGNOSIS — H35033 Hypertensive retinopathy, bilateral: Secondary | ICD-10-CM | POA: Diagnosis not present

## 2018-07-17 DIAGNOSIS — H4323 Crystalline deposits in vitreous body, bilateral: Secondary | ICD-10-CM | POA: Diagnosis not present

## 2018-07-24 DIAGNOSIS — H2513 Age-related nuclear cataract, bilateral: Secondary | ICD-10-CM | POA: Diagnosis not present

## 2018-07-24 DIAGNOSIS — H2511 Age-related nuclear cataract, right eye: Secondary | ICD-10-CM | POA: Diagnosis not present

## 2018-08-02 DIAGNOSIS — M545 Low back pain: Secondary | ICD-10-CM | POA: Diagnosis not present

## 2018-08-08 ENCOUNTER — Telehealth: Payer: Self-pay | Admitting: Internal Medicine

## 2018-08-08 MED ORDER — FLUTICASONE PROPIONATE 50 MCG/ACT NA SUSP: NASAL | 1 refills | Status: DC

## 2018-08-08 NOTE — Telephone Encounter (Signed)
Rx sent 

## 2018-08-08 NOTE — Telephone Encounter (Signed)
Copied from [place] [id number]. Topic: Quick Communication - Rx Refill/Question >> Aug 08, 2018 10:00 AM Scherrie Gerlach wrote: Medication: fluticasone (FLONASE) 50 MCG/ACT nasal spray  Pt lost his last bottle of this medicine and is leaving town today. Pt going to his home in [place] to stay for a while would like you to send to the [facility] down there.  [place], [place] [phone] (Phone) [phone] (Fax)

## 2018-08-08 NOTE — Telephone Encounter (Signed)
Pt aware.

## 2018-08-10 ENCOUNTER — Other Ambulatory Visit: Payer: Self-pay | Admitting: Internal Medicine

## 2018-10-04 DIAGNOSIS — M545 Low back pain: Secondary | ICD-10-CM | POA: Diagnosis not present

## 2018-10-04 DIAGNOSIS — Z96652 Presence of left artificial knee joint: Secondary | ICD-10-CM | POA: Diagnosis not present

## 2018-10-04 DIAGNOSIS — M25562 Pain in left knee: Secondary | ICD-10-CM | POA: Diagnosis not present

## 2018-10-07 ENCOUNTER — Encounter: Payer: Self-pay | Admitting: Internal Medicine

## 2018-10-07 DIAGNOSIS — E7849 Other hyperlipidemia: Secondary | ICD-10-CM

## 2018-10-11 ENCOUNTER — Encounter: Payer: Self-pay | Admitting: Internal Medicine

## 2018-10-11 NOTE — Telephone Encounter (Signed)
This my chart message did not make it to you. I do not know what happened. I told the pt that you never got this message so he is aware. Please reply when you can. Thank you.

## 2018-10-12 MED ORDER — SIMVASTATIN 20 MG PO TABS: 40.0000 mg | ORAL_TABLET | Freq: Every day | ORAL | 2 refills | Status: DC

## 2018-10-12 NOTE — Addendum Note (Signed)
Addended by: Binnie Rail on: 10/12/2018 03:09 PM   Modules accepted: Orders

## 2018-10-18 ENCOUNTER — Other Ambulatory Visit: Payer: Self-pay | Admitting: Gastroenterology

## 2018-10-18 DIAGNOSIS — M545 Low back pain: Secondary | ICD-10-CM | POA: Diagnosis not present

## 2018-10-19 ENCOUNTER — Other Ambulatory Visit: Payer: Self-pay

## 2018-10-19 MED ORDER — GLYCOPYRROLATE 1 MG PO TABS: 1.0000 mg | ORAL_TABLET | Freq: Two times a day (BID) | ORAL | 0 refills | Status: DC

## 2018-10-31 DIAGNOSIS — M545 Low back pain: Secondary | ICD-10-CM | POA: Diagnosis not present

## 2018-11-06 DIAGNOSIS — H2511 Age-related nuclear cataract, right eye: Secondary | ICD-10-CM | POA: Diagnosis not present

## 2018-11-06 DIAGNOSIS — H25811 Combined forms of age-related cataract, right eye: Secondary | ICD-10-CM | POA: Diagnosis not present

## 2018-11-08 ENCOUNTER — Other Ambulatory Visit: Payer: Self-pay

## 2018-11-08 ENCOUNTER — Other Ambulatory Visit: Payer: Self-pay | Admitting: Internal Medicine

## 2018-11-08 ENCOUNTER — Encounter: Payer: Self-pay | Admitting: Internal Medicine

## 2018-11-08 ENCOUNTER — Other Ambulatory Visit (INDEPENDENT_AMBULATORY_CARE_PROVIDER_SITE_OTHER): Payer: PPO

## 2018-11-08 DIAGNOSIS — E7849 Other hyperlipidemia: Secondary | ICD-10-CM

## 2018-11-08 LAB — COMPREHENSIVE METABOLIC PANEL
ALT: 19 U/L (ref 0–53)
AST: 26 U/L (ref 0–37)
Albumin: 4.4 g/dL (ref 3.5–5.2)
Alkaline Phosphatase: 56 U/L (ref 39–117)
BUN: 17 mg/dL (ref 6–23)
CO2: 24 mEq/L (ref 19–32)
Calcium: 9.5 mg/dL (ref 8.4–10.5)
Chloride: 107 mEq/L (ref 96–112)
Creatinine, Ser: 0.88 mg/dL (ref 0.40–1.50)
GFR: 85.2 mL/min (ref >60.00)
Glucose, Bld: 86 mg/dL (ref 70–99)
Potassium: 4.3 mEq/L (ref 3.5–5.1)
Sodium: 141 mEq/L (ref 135–145)
Total Bilirubin: 0.5 mg/dL (ref 0.2–1.2)
Total Protein: 7.2 g/dL (ref 6.0–8.3)

## 2018-11-08 LAB — LIPID PANEL
Cholesterol: 174 mg/dL (ref 0–200)
HDL: 69.6 mg/dL (ref >39.00)
LDL Cholesterol: 84 mg/dL (ref 0–99)
NonHDL: 104.74
Total CHOL/HDL Ratio: 3
Triglycerides: 103 mg/dL (ref 0.0–149.0)
VLDL: 20.6 mg/dL (ref 0.0–40.0)

## 2018-11-08 MED ORDER — LEVOTHYROXINE SODIUM 25 MCG PO TABS: ORAL_TABLET | ORAL | 0 refills | Status: DC

## 2018-11-08 MED ORDER — SIMVASTATIN 20 MG PO TABS: 40.0000 mg | ORAL_TABLET | Freq: Every day | ORAL | 0 refills | Status: DC

## 2018-11-10 MED ORDER — SIMVASTATIN 40 MG PO TABS: 40.0000 mg | ORAL_TABLET | Freq: Every day | ORAL | 3 refills | Status: DC

## 2018-11-10 NOTE — Addendum Note (Signed)
Addended by: Binnie Rail on: 11/10/2018 01:08 PM   Modules accepted: Orders

## 2018-11-14 DIAGNOSIS — D1801 Hemangioma of skin and subcutaneous tissue: Secondary | ICD-10-CM | POA: Diagnosis not present

## 2018-11-14 DIAGNOSIS — D2272 Melanocytic nevi of left lower limb, including hip: Secondary | ICD-10-CM | POA: Diagnosis not present

## 2018-11-14 DIAGNOSIS — H2512 Age-related nuclear cataract, left eye: Secondary | ICD-10-CM | POA: Diagnosis not present

## 2018-11-14 DIAGNOSIS — L821 Other seborrheic keratosis: Secondary | ICD-10-CM | POA: Diagnosis not present

## 2018-11-14 DIAGNOSIS — Z85828 Personal history of other malignant neoplasm of skin: Secondary | ICD-10-CM | POA: Diagnosis not present

## 2018-11-14 DIAGNOSIS — L718 Other rosacea: Secondary | ICD-10-CM | POA: Diagnosis not present

## 2018-11-14 DIAGNOSIS — L812 Freckles: Secondary | ICD-10-CM | POA: Diagnosis not present

## 2018-11-14 DIAGNOSIS — M545 Low back pain: Secondary | ICD-10-CM | POA: Diagnosis not present

## 2018-11-15 DIAGNOSIS — M13842 Other specified arthritis, left hand: Secondary | ICD-10-CM | POA: Diagnosis not present

## 2018-11-20 DIAGNOSIS — H2512 Age-related nuclear cataract, left eye: Secondary | ICD-10-CM | POA: Diagnosis not present

## 2018-11-20 DIAGNOSIS — H25812 Combined forms of age-related cataract, left eye: Secondary | ICD-10-CM | POA: Diagnosis not present

## 2018-11-21 ENCOUNTER — Other Ambulatory Visit: Payer: Self-pay

## 2018-11-21 MED ORDER — GLYCOPYRROLATE 1 MG PO TABS: 1.0000 mg | ORAL_TABLET | Freq: Two times a day (BID) | ORAL | 0 refills | Status: DC

## 2018-11-27 DIAGNOSIS — M545 Low back pain: Secondary | ICD-10-CM | POA: Diagnosis not present

## 2018-11-30 ENCOUNTER — Telehealth: Payer: Self-pay | Admitting: *Deleted

## 2018-11-30 NOTE — Progress Notes (Addendum)
Subjective:   Jeremy Waters is a 73 y.o. male who presents for Medicare Annual/Subsequent preventive examination. I connected with patient by a telephone and verified that I am speaking with the correct person using two identifiers. Patient stated full name and DOB. Patient gave permission to continue with telephonic visit. Patient's location was at home and Nurse's location was at [place] office.   Review of Systems:   Cardiac Risk Factors include: advanced age (>24men, >36 women);dyslipidemia;male gender Sleep patterns: feels rested on waking, gets up 1 times nightly to void and sleeps 7-8 hours nightly.    Home Safety/Smoke Alarms: Feels safe in home. Smoke alarms in place.  Living environment; residence and Firearm Safety: 1-story house/ trailer. Lives with wife, no needs for DME, good support system Seat Belt Safety/Bike Helmet: Wears seat belt.     Objective:    Vitals: There were no vitals taken for this visit.  There is no height or weight on file to calculate BMI.  Advanced Directives 12/01/2018 10/20/2017 10/11/2017 12/26/2015 10/08/2015 10/03/2015 07/03/2013  Does Patient Have a Medical Advance Directive? Yes Yes Yes Yes Yes Yes Patient has advance directive, copy not in chart  Type of Advance Directive [place];Living will - [place];Living will [place];Living will [place];Living will [place];Living will [place];Living will  Does patient want to make changes to medical advance directive? - - - - Yes - information given - -  Copy of [place] in Chart? No - copy requested - No - copy requested - - (No Data) Copy requested from family    Tobacco Social History   Tobacco Use  Smoking Status Former Smoker  Smokeless Tobacco Never Used  Tobacco Comment   stopped smoking cigarettes 1983, 1 cigar /day 2001-2013     Counseling given: Not  Answered Comment: stopped smoking cigarettes 1983, 1 cigar /day 2001-2013  Past Medical History:  Diagnosis Date  . Adenomatous colon polyp 09/2007  . Arthritis    OA / PAIN LEFT KNEE  . Cancer ([place])   . Diverticulosis of colon    w/o hemorrage  . Heart murmur    TOLD HE HAS A SLIGHT MURMUR  . Hyperlipidemia   . Inguinal hernia    left side   . Thyroid disease    Past Surgical History:  Procedure Laterality Date  . BACK SURGERY  05/12/2005   L5  . COLONOSCOPY  1999   Negative; Dr Fuller Plan  . COLONOSCOPY  2004   tics, hemorrhoids  . COLONOSCOPY  2009   polyps ([facility]  . HERNIA REPAIR  1987  . HERNIA REPAIR  10/2003  . KNEE SURGERY  1969   Patellar fracture fragment Lt  . LAMINECTOMY  2000   L4-5  . LUMBAR LAMINECTOMY/DECOMPRESSION MICRODISCECTOMY Right 10/08/2015   Procedure: MICRO LUMBAR DECOMPRESSION L4-L5 AND L5-S1 ON RIGHT ;  Surgeon: Susa Day, MD;  Location: [facility];  Service: Orthopedics;  Laterality: Right;  . MASS EXCISION  04/26/2011   Procedure: MINOR EXCISION OF MASS;  Surgeon: Earnstine Regal, MD;  Location: [facility];  Service: General;  Laterality: Right;  Excise subcutaneous mass right axilla Minor Room  . prostaectomy    . PROSTATECTOMY  04/16/2005  . TONSILLECTOMY    . TOTAL KNEE ARTHROPLASTY Left 07/03/2013   Procedure: LEFT TOTAL KNEE ARTHROPLASTY;  Surgeon: Mauri Pole, MD;  Location: [facility];  Service: Orthopedics;  Laterality: Left;   Family History  Problem Relation Age of Onset  . Lung cancer Mother   . COPD Mother   . Cancer Father        Bladder Cancer  . Prostate cancer Father   . Colon cancer Father 48  . Urolithiasis Father   . Hypertension Paternal Uncle   . Alcohol abuse Paternal Uncle   . Stroke Paternal Uncle 34  . Lung cancer Sister   . Asthma Neg Hx   . Heart disease Neg Hx    Social History   Socioeconomic History  . Marital status: Married    Spouse name: Not on file  . Number of children: 2  . Years of  education: Not on file  . Highest education level: Not on file  Occupational History  . Occupation: Nurse, mental health: [employer]  Social Needs  . Financial resource strain: Not hard at all  . Food insecurity    Worry: Never true    Inability: Never true  . Transportation needs    Medical: No    Non-medical: No  Tobacco Use  . Smoking status: Former Research scientist (life sciences)  . Smokeless tobacco: Never Used  . Tobacco comment: stopped smoking cigarettes 1983, 1 cigar /day 2001-2013  Substance and Sexual Activity  . Alcohol use: Yes    Alcohol/week: 7.0 standard drinks    Types: 7 Standard drinks or equivalent per week    Comment: scotch or wine daily  . Drug use: No  . Sexual activity: Yes  Lifestyle  . Physical activity    Days per week: 6 days    Minutes per session: 70 min  . Stress: Not at all  Relationships  . Social connections    Talks on phone: More than three times a week    Gets together: More than three times a week    Attends religious service: More than 4 times per year    Active member of club or organization: Yes    Attends meetings of clubs or organizations: More than 4 times per year    Relationship status: Married  Other Topics Concern  . Not on file  Social History Narrative   Exercises daily             Outpatient Encounter Medications as of 12/01/2018  Medication Sig  . beta carotene 25000 UNIT capsule Take 25,000 Units by mouth daily.  . clonazePAM (KLONOPIN) 0.5 MG tablet Take 1 tablet (0.5 mg total) by mouth at bedtime as needed.  . fluticasone (FLONASE) 50 MCG/ACT nasal spray PLACE 1 SPRAY INTO BOTH NOSTRILS DAILY  . glycopyrrolate (ROBINUL) 1 MG tablet Take 1 tablet (1 mg total) by mouth 2 (two) times daily.  Marland Kitchen levothyroxine (SYNTHROID) 25 MCG tablet Take 1 tablet by mouth daily before breakfast  . pyridOXINE (VITAMIN B-6) 100 MG tablet Take 100 mg by mouth daily.  Marland Kitchen saccharomyces boulardii (FLORASTOR) 250 MG capsule Take 1 capsule (250 mg  total) by mouth 2 (two) times daily. (Patient taking differently: Take 250 mg by mouth as needed. )  . sildenafil (REVATIO) 20 MG tablet Take 2-5 tablets daily as needed for erectile dysfunction  . simvastatin (ZOCOR) 40 MG tablet Take 1 tablet (40 mg total) by mouth at bedtime.  . vitamin C (ASCORBIC ACID) 500 MG tablet Take 500 mg by mouth 2 (two) times daily.   . vitamin E 400 UNIT capsule Take 400 Units by mouth daily.   No facility-administered encounter  medications on file as of 12/01/2018.     Activities of Daily Living In your present state of health, do you have any difficulty performing the following activities: 12/01/2018  Hearing? N  Vision? N  Difficulty concentrating or making decisions? N  Walking or climbing stairs? N  Dressing or bathing? N  Doing errands, shopping? N  Preparing Food and eating ? N  Using the Toilet? N  In the past six months, have you accidently leaked urine? N  Do you have problems with loss of bowel control? N  Managing your Medications? N  Managing your Finances? N  Housekeeping or managing your Housekeeping? N  Some recent data might be hidden    Patient Care Team: Binnie Rail, MD as PCP - General (Internal Medicine) Newt Lukes, AUD (Audiology) Ladene Artist, MD as Consulting Physician (Gastroenterology)   Assessment:   This is a routine wellness examination for [place]. Physical assessment deferred to PCP.   Exercise Activities and Dietary recommendations Current Exercise Habits: Home exercise routine, Type of exercise: walking, Time (Minutes): 35, Frequency (Times/Week): 6, Weekly Exercise (Minutes/Week): 210, Intensity: Mild, Exercise limited by: None identified  Diet (meal preparation, eat out, water intake, caffeinated beverages, dairy products, fruits and vegetables): in general, a "healthy" diet  , well balanced. eats a variety of fruits and vegetables daily, limits salt, fat/cholesterol, sugar,carbohydrates,caffeine, drinks  6-8 glasses of water daily.  Goals    . Patient Stated     Enjoy life, help others through church, continue to spend time with family and grandchildren.        Fall Risk Fall Risk  12/01/2018 10/11/2017 03/03/2017 09/10/2015 07/02/2014  Falls in the past year? 0 No No No No  Number falls in past yr: 0 - - - -    Depression Screen PHQ 2/9 Scores 12/01/2018 10/11/2017 03/03/2017 11/09/2016  PHQ - 2 Score 0 0 0 0    Cognitive Function MMSE - Mini Mental State Exam 10/11/2017  Orientation to time 5  Orientation to Place 5  Registration 3  Attention/ Calculation 5  Recall 3  Language- name 2 objects 2  Language- repeat 1  Language- follow 3 step command 3  Language- read & follow direction 1  Write a sentence 1  Copy design 1  Total score 30       Ad8 score reviewed for issues:  Issues making decisions: no  Less interest in hobbies / activities: no  Repeats questions, stories (family complaining): no  Trouble using ordinary gadgets (microwave, computer, phone):no  Forgets the month or year: no  Mismanaging finances: no  Remembering appts: no  Daily problems with thinking and/or memory: no Ad8 score is= 0  Immunization History  Administered Date(s) Administered  . Hepatitis A 10/15/1997, 12/04/1997  . Hepatitis B 09/09/2008, 10/15/2008, 08/01/2017  . Influenza Split 03/16/2011, 03/03/2012  . Influenza, High Dose Seasonal PF 02/09/2016, 03/03/2017, 02/23/2018  . Influenza,inj,Quad PF,6+ Mos 03/08/2014, 02/27/2015  . Influenza-Unspecified 03/07/2013  . Meningococcal Conjugate 09/09/2008  . Pneumococcal Conjugate-13 11/20/2014  . Pneumococcal Polysaccharide-23 06/07/2013  . Tdap 05/24/2008, 08/01/2017  . Typhoid Inactivated 09/04/1997, 07/30/2004, 08/01/2017  . Yellow Fever 09/09/2008  . Zoster 03/18/2011  . Zoster Recombinat (Shingrix) 09/21/2016, 03/03/2017     Screening Tests Health Maintenance  Topic Date Due  . INFLUENZA VACCINE  12/23/2018  .  COLONOSCOPY  10/20/2020  . TETANUS/TDAP  08/02/2027  . Hepatitis C Screening  Completed  . PNA vac Low Risk Adult  Completed  Plan:    Reviewed health maintenance screenings with patient today and relevant education, vaccines, and/or referrals were provided.   Continue to eat heart healthy diet (full of fruits, vegetables, whole grains, lean protein, water--limit salt, fat, and sugar intake) and increase physical activity as tolerated.  Continue doing brain stimulating activities (puzzles, reading, adult coloring books, staying active) to keep memory sharp.   I have personally reviewed and noted the following in the patient's chart:   . Medical and social history . Use of alcohol, tobacco or illicit drugs  . Current medications and supplements . Functional ability and status . Nutritional status . Physical activity . Advanced directives . List of other physicians . Screenings to include cognitive, depression, and falls . Referrals and appointments  In addition, I have reviewed and discussed with patient certain preventive protocols, quality metrics, and best practice recommendations. A written personalized care plan for preventive services as well as general preventive health recommendations were provided to patient.     Michiel Cowboy, RN  12/01/2018   Medical screening examination/treatment/procedure(s) were performed by non-physician practitioner and as supervising physician I was immediately available for consultation/collaboration. I agree with above. Binnie Rail, MD

## 2018-11-30 NOTE — Telephone Encounter (Signed)
Called patient to schedule AWV, patient scheduled on 12/01/18.

## 2018-12-01 ENCOUNTER — Other Ambulatory Visit: Payer: Self-pay

## 2018-12-01 ENCOUNTER — Ambulatory Visit (INDEPENDENT_AMBULATORY_CARE_PROVIDER_SITE_OTHER): Payer: PPO | Admitting: *Deleted

## 2018-12-01 DIAGNOSIS — Z0000 Encounter for general adult medical examination without abnormal findings: Secondary | ICD-10-CM | POA: Diagnosis not present

## 2018-12-19 DIAGNOSIS — M545 Low back pain: Secondary | ICD-10-CM | POA: Diagnosis not present

## 2018-12-22 ENCOUNTER — Other Ambulatory Visit: Payer: Self-pay

## 2018-12-28 ENCOUNTER — Encounter: Payer: Self-pay | Admitting: Internal Medicine

## 2018-12-30 ENCOUNTER — Other Ambulatory Visit: Payer: Self-pay | Admitting: Gastroenterology

## 2019-01-01 ENCOUNTER — Telehealth: Payer: Self-pay | Admitting: Gastroenterology

## 2019-01-01 MED ORDER — GLYCOPYRROLATE 1 MG PO TABS: 1.0000 mg | ORAL_TABLET | Freq: Two times a day (BID) | ORAL | 0 refills | Status: DC

## 2019-01-01 NOTE — Telephone Encounter (Signed)
Pt is scheduled for appt 9/22 and requested a refill on [facility].

## 2019-01-01 NOTE — Telephone Encounter (Signed)
Prescription sent to patient's pharmacy until scheduled appt. Left a message informing patient that the prescription has been sent to the pharmacy.

## 2019-01-08 DIAGNOSIS — M545 Low back pain: Secondary | ICD-10-CM | POA: Diagnosis not present

## 2019-01-15 ENCOUNTER — Encounter: Payer: Self-pay | Admitting: Internal Medicine

## 2019-01-15 DIAGNOSIS — R079 Chest pain, unspecified: Secondary | ICD-10-CM | POA: Diagnosis not present

## 2019-01-16 ENCOUNTER — Ambulatory Visit (INDEPENDENT_AMBULATORY_CARE_PROVIDER_SITE_OTHER): Payer: PPO | Admitting: Internal Medicine

## 2019-01-16 ENCOUNTER — Encounter: Payer: Self-pay | Admitting: Internal Medicine

## 2019-01-16 ENCOUNTER — Other Ambulatory Visit (INDEPENDENT_AMBULATORY_CARE_PROVIDER_SITE_OTHER): Payer: PPO

## 2019-01-16 DIAGNOSIS — R1012 Left upper quadrant pain: Secondary | ICD-10-CM | POA: Insufficient documentation

## 2019-01-16 LAB — COMPREHENSIVE METABOLIC PANEL
ALT: 19 U/L (ref 0–53)
AST: 27 U/L (ref 0–37)
Albumin: 4.7 g/dL (ref 3.5–5.2)
Alkaline Phosphatase: 51 U/L (ref 39–117)
BUN: 16 mg/dL (ref 6–23)
CO2: 28 mEq/L (ref 19–32)
Calcium: 10.1 mg/dL (ref 8.4–10.5)
Chloride: 102 mEq/L (ref 96–112)
Creatinine, Ser: 0.8 mg/dL (ref 0.40–1.50)
GFR: 95.05 mL/min (ref >60.00)
Glucose, Bld: 88 mg/dL (ref 70–99)
Potassium: 4.1 mEq/L (ref 3.5–5.1)
Sodium: 139 mEq/L (ref 135–145)
Total Bilirubin: 0.7 mg/dL (ref 0.2–1.2)
Total Protein: 7.5 g/dL (ref 6.0–8.3)

## 2019-01-16 LAB — CBC WITH DIFFERENTIAL/PLATELET
Basophils Absolute: 0.1 10*3/uL (ref 0.0–0.1)
Basophils Relative: 1.4 % (ref 0.0–3.0)
Eosinophils Absolute: 0.1 10*3/uL (ref 0.0–0.7)
Eosinophils Relative: 2 % (ref 0.0–5.0)
HCT: 41.7 % (ref 39.0–52.0)
Hemoglobin: 14.2 g/dL (ref 13.0–17.0)
Lymphocytes Relative: 29.2 % (ref 12.0–46.0)
Lymphs Abs: 2 10*3/uL (ref 0.7–4.0)
MCHC: 34.1 g/dL (ref 30.0–36.0)
MCV: 98.4 fl (ref 78.0–100.0)
Monocytes Absolute: 0.9 10*3/uL (ref 0.1–1.0)
Monocytes Relative: 13.6 % — ABNORMAL HIGH (ref 3.0–12.0)
Neutro Abs: 3.7 10*3/uL (ref 1.4–7.7)
Neutrophils Relative %: 53.8 % (ref 43.0–77.0)
Platelets: 248 10*3/uL (ref 150.0–400.0)
RBC: 4.24 Mil/uL (ref 4.22–5.81)
RDW: 13.5 % (ref 11.5–15.5)
WBC: 6.9 10*3/uL (ref 4.0–10.5)

## 2019-01-16 LAB — LIPASE: Lipase: 32 U/L (ref 11.0–59.0)

## 2019-01-16 LAB — AMYLASE: Amylase: 43 U/L (ref 27–131)

## 2019-01-16 NOTE — Telephone Encounter (Signed)
Called pt and LVM to call and set up an appointment in regards to my chart message.

## 2019-01-16 NOTE — Progress Notes (Signed)
Virtual Visit via Video Note  I connected with Jeremy Waters on 01/16/19 at 10:30 AM EDT by a video enabled telemedicine application and verified that I am speaking with the correct person using two identifiers.   I discussed the limitations of evaluation and management by telemedicine and the availability of in person appointments. The patient expressed understanding and agreed to proceed.  The patient is currently at home and I am in the office.    No referring provider.    History of Present Illness: This is an acute visit for left sided pain.  It stared last Thursday, 5 days ago.  It is just under his left rib cage.  He has had some stomach issues and bathroom issues for a couple of weeks.  He is processing foods quickly.  It may just be gas, but has had more frequent stools than usual.  He does not feel like the pain is related to eating or bowel movements.  The pain is intermittent.  He does feel it especially when he is changing positions-turning to get out of the car or changing positions/standing.  He can feel it with deep breaths, but not always.  On occasion the pain will grab him and the more intense at an intensity of 6/10.  He will not feel it at night and does not wake him up.  He does not feel that the pain is always related to movement.  He took Aleve once and it may have helped a little.  Over the weekend he played golf and it did not bother him at all.  He has not noticed any other concerning symptoms.    If he palpates the left upper quadrant he denies any pain, but the other day he ran into something in that area and it did hurt.   Review of Systems  Constitutional: Negative for chills and fever.  Respiratory: Negative for cough, shortness of breath and wheezing.   Cardiovascular: Negative for chest pain and palpitations.  Gastrointestinal: Negative for heartburn (occ - no change).  Genitourinary: Negative for dysuria and frequency.  Musculoskeletal: Negative for  back pain.  Skin: Negative for rash.  Neurological: Negative for tingling.     Social History   Socioeconomic History  . Marital status: Married    Spouse name: Not on file  . Number of children: 2  . Years of education: Not on file  . Highest education level: Not on file  Occupational History  . 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 Financial resource strain: Not hard at all  . Food insecurity    Worry: Never true    Inability: Never true  . Transportation needs    Medical: No    Non-medical: No  Tobacco Use  . Smoking status: Former Research scientist (life sciences)  . Smokeless tobacco: Never Used  . Tobacco comment: stopped smoking cigarettes 1983, 1 cigar /day 2001-2013  Substance and Sexual Activity  . Alcohol use: Yes    Alcohol/week: 7.0 standard drinks    Types: 7 Standard drinks or equivalent per week    Comment: scotch or wine daily  . Drug use: No  . Sexual activity: Yes  Lifestyle  . Physical activity    Days per week: 6 days    Minutes per session: 70 min  . Stress: Not at all  Relationships  . Social connections    Talks on phone: More than three times a week    Gets  together: More than three times a week    Attends religious service: More than 4 times per year    Active member of club or organization: Yes    Attends meetings of clubs or organizations: More than 4 times per year    Relationship status: Married  Other Topics Concern  . Not on file  Social History Narrative   Exercises daily              Observations/Objective: Appears well in NAD Pain appears to be in the left upper quadrant just underneath the rib cage on the left  Assessment and Plan:  See Problem List for Assessment and Plan of chronic medical problems.   Follow Up Instructions:    I discussed the assessment and treatment plan with the patient. The patient was provided an opportunity to ask questions and all were answered. The patient agreed with the plan  and demonstrated an understanding of the instructions.   The patient was advised to call back or seek an in-person evaluation if the symptoms worsen or if the condition fails to improve as anticipated.    Binnie Rail, MD

## 2019-01-16 NOTE — Assessment & Plan Note (Signed)
Left upper quadrant/just under left anterior rib pain Started 5 days ago Pain is not obviously muscular skeletal or GI in nature-he has components of both The pain is not severe and is intermittent Often related to movement, but not always Advised that he can take Aleve as needed We will get blood work done today or first thing in the morning-amylase, lipase, CBC, CMP Depending on the results and how the pain is we will determine further evaluation after that-may need to consider CT the abdomen-?  Diverticulitis versus musculoskeletal in nature

## 2019-01-17 ENCOUNTER — Ambulatory Visit (INDEPENDENT_AMBULATORY_CARE_PROVIDER_SITE_OTHER): Payer: PPO

## 2019-01-17 DIAGNOSIS — Z23 Encounter for immunization: Secondary | ICD-10-CM

## 2019-01-23 DIAGNOSIS — M545 Low back pain: Secondary | ICD-10-CM | POA: Diagnosis not present

## 2019-01-30 ENCOUNTER — Other Ambulatory Visit: Payer: Self-pay

## 2019-01-30 MED ORDER — LEVOTHYROXINE SODIUM 25 MCG PO TABS: ORAL_TABLET | ORAL | 0 refills | Status: DC

## 2019-02-01 ENCOUNTER — Encounter: Payer: Self-pay | Admitting: Internal Medicine

## 2019-02-04 NOTE — Progress Notes (Signed)
Subjective:    Patient ID: Jeremy Waters, male    DOB: [birth year], 73 y.o.   MRN: [id number]  HPI He is here for a physical exam.     Medications and allergies reviewed with patient and updated if appropriate.  Patient Active Problem List   Diagnosis Date Noted  . LUQ pain 01/16/2019  . Right cervical radiculopathy 03/03/2017  . Carotid artery disease ([place]) 09/15/2016  . Spinal stenosis, lumbar 10/08/2015  . Sensorineural hearing loss of both ears 04/10/2014  . Organic impotence 04/10/2014  . Hyperglycemia 04/02/2014  . S/P left TKA 07/03/2013  . Left knee DJD 06/07/2013  . Right carotid bruit 06/07/2013  . Hypothyroidism 03/03/2012  . Axillary mass, right 04/07/2011  . ROSACEA 03/19/2010  . ARTHRALGIA 03/19/2010  . MYALGIA 03/19/2010  . CERVICALGIA 09/05/2009  . CARPAL TUNNEL SYNDROME 06/17/2008  . DEGENERATIVE JOINT DISEASE, GENERALIZED 06/17/2008  . CERVICAL RADICULOPATHY, LEFT 06/17/2008  . Disturbance in sleep behavior 03/04/2008  . PROSTATE CANCER, HX OF 03/04/2008  . Hyperlipidemia 06/15/2007  . DIVERTICULOSIS, COLON W/O HEM 03/03/2007    Current Outpatient Medications on File Prior to Visit  Medication Sig Dispense Refill  . beta carotene 25000 UNIT capsule Take 25,000 Units by mouth daily.    . clonazePAM (KLONOPIN) 0.5 MG tablet Take 1 tablet (0.5 mg total) by mouth at bedtime as needed. 30 tablet 0  . fluticasone (FLONASE) 50 MCG/ACT nasal spray PLACE 1 SPRAY INTO BOTH NOSTRILS DAILY 16 g 1  . glycopyrrolate (ROBINUL) 1 MG tablet Take 1 tablet (1 mg total) by mouth 2 (two) times daily. 60 tablet 0  . levothyroxine (SYNTHROID) 25 MCG tablet Take 1 tablet by mouth daily before breakfast. Need office visit for more refills. 30 tablet 0  . pyridOXINE (VITAMIN B-6) 100 MG tablet Take 100 mg by mouth daily.    Marland Kitchen saccharomyces boulardii (FLORASTOR) 250 MG capsule Take 1 capsule (250 mg total) by mouth 2 (two) times daily. (Patient taking differently: Take 250  mg by mouth as needed. ) 60 capsule 11  . sildenafil (REVATIO) 20 MG tablet Take 2-5 tablets daily as needed for erectile dysfunction    . simvastatin (ZOCOR) 40 MG tablet Take 1 tablet (40 mg total) by mouth at bedtime. 90 tablet 3  . vitamin C (ASCORBIC ACID) 500 MG tablet Take 500 mg by mouth 2 (two) times daily.     . vitamin E 400 UNIT capsule Take 400 Units by mouth daily.     No current facility-administered medications on file prior to visit.     Past Medical History:  Diagnosis Date  . Adenomatous colon polyp 09/2007  . Arthritis    OA / PAIN LEFT KNEE  . Cancer ([place])   . Diverticulosis of colon    w/o hemorrage  . Heart murmur    TOLD HE HAS A SLIGHT MURMUR  . Hyperlipidemia   . Inguinal hernia    left side   . Thyroid disease     Past Surgical History:  Procedure Laterality Date  . BACK SURGERY  05/12/2005   L5  . COLONOSCOPY  1999   Negative; Dr Fuller Plan  . COLONOSCOPY  2004   tics, hemorrhoids  . COLONOSCOPY  2009   polyps ([place]  . HERNIA REPAIR  1987  . HERNIA REPAIR  10/2003  . KNEE SURGERY  1969   Patellar fracture fragment Lt  . LAMINECTOMY  2000   L4-5  . LUMBAR LAMINECTOMY/DECOMPRESSION MICRODISCECTOMY Right 10/08/2015  Procedure: MICRO LUMBAR DECOMPRESSION L4-L5 AND L5-S1 ON RIGHT ;  Surgeon: Susa Day, MD;  Location: [facility];  Service: Orthopedics;  Laterality: Right;  . MASS EXCISION  04/26/2011   Procedure: MINOR EXCISION OF MASS;  Surgeon: Earnstine Regal, MD;  Location: [facility];  Service: General;  Laterality: Right;  Excise subcutaneous mass right axilla Minor Room  . prostaectomy    . PROSTATECTOMY  04/16/2005  . TONSILLECTOMY    . TOTAL KNEE ARTHROPLASTY Left 07/03/2013   Procedure: LEFT TOTAL KNEE ARTHROPLASTY;  Surgeon: Mauri Pole, MD;  Location: [facility];  Service: Orthopedics;  Laterality: Left;    Social History   Socioeconomic History  . Marital status: Married    Spouse name: Not on file  . Number of  children: 2  . Years of education: Not on file  . Highest education level: Not on file  Occupational History  . Occupation: Nurse, mental health: [employer]  Social Needs  . Financial resource strain: Not hard at all  . Food insecurity    Worry: Never true    Inability: Never true  . Transportation needs    Medical: No    Non-medical: No  Tobacco Use  . Smoking status: Former Research scientist (life sciences)  . Smokeless tobacco: Never Used  . Tobacco comment: stopped smoking cigarettes 1983, 1 cigar /day 2001-2013  Substance and Sexual Activity  . Alcohol use: Yes    Alcohol/week: 7.0 standard drinks    Types: 7 Standard drinks or equivalent per week    Comment: scotch or wine daily  . Drug use: No  . Sexual activity: Yes  Lifestyle  . Physical activity    Days per week: 6 days    Minutes per session: 70 min  . Stress: Not at all  Relationships  . Social connections    Talks on phone: More than three times a week    Gets together: More than three times a week    Attends religious service: More than 4 times per year    Active member of club or organization: Yes    Attends meetings of clubs or organizations: More than 4 times per year    Relationship status: Married  Other Topics Concern  . Not on file  Social History Narrative   Exercises daily             Family History  Problem Relation Age of Onset  . Lung cancer Mother   . COPD Mother   . Cancer Father        Bladder Cancer  . Prostate cancer Father   . Colon cancer Father 2  . Urolithiasis Father   . Hypertension Paternal Uncle   . Alcohol abuse Paternal Uncle   . Stroke Paternal Uncle 9  . Lung cancer Sister   . Asthma Neg Hx   . Heart disease Neg Hx     Review of Systems  Constitutional: Negative for chills, fatigue and fever.  Eyes: Negative for visual disturbance.  Respiratory: Negative for cough, shortness of breath and wheezing.   Cardiovascular: Negative for chest pain, palpitations and leg  swelling.  Gastrointestinal: Negative for abdominal pain, blood in stool, constipation, diarrhea and nausea.       Occ GERD  Genitourinary: Negative for dysuria and hematuria.  Musculoskeletal: Positive for arthralgias and back pain.  Skin: Negative for color change and rash.  Neurological: Positive for numbness (right foot since last back surgery). Negative for dizziness,  light-headedness and headaches.  Psychiatric/Behavioral: Negative for dysphoric mood. The patient is nervous/anxious (occ).        Objective:   Vitals:   02/05/19 1255  BP: 120/70  Pulse: 67  Resp: 16  Temp: 99.1 F (37.3 C)  SpO2: 96%   Filed Weights   02/05/19 1255  Weight: 152 lb 12.8 oz (69.3 kg)   Body mass index is 23.93 kg/m.  Wt Readings from Last 3 Encounters:  02/05/19 152 lb 12.8 oz (69.3 kg)  12/19/17 149 lb (67.6 kg)  10/20/17 146 lb (66.2 kg)     Physical Exam Constitutional: He appears well-developed and well-nourished. No distress.  HENT:  Head: Normocephalic and atraumatic.  Right Ear: External ear normal.  Left Ear: External ear normal.  Mouth/Throat: Oropharynx is clear and moist.  Normal ear canals and TM b/l  Eyes: Conjunctivae and EOM are normal.  Neck: Neck supple. No tracheal deviation present. No thyromegaly present.  B/l carotid bruit  Cardiovascular: Normal rate, regular rhythm, normal heart sounds and intact distal pulses.   2/6 sys murmur heard. Pulmonary/Chest: Effort normal and breath sounds normal. No respiratory distress. He has no wheezes. He has no rales.  Abdominal: Soft. He exhibits no distension. There is no tenderness.  Genitourinary: deferred  Musculoskeletal: He exhibits no edema.  Lymphadenopathy:   He has no cervical adenopathy.  Skin: Skin is warm and dry. He is not diaphoretic.  Psychiatric: He has a normal mood and affect. His behavior is normal.         Assessment & Plan:   Physical exam: Screening blood work ordered -tsh Immunizations    Up to date  Colonoscopy   Up to date  Eye exams  Up to date  Exercise  Walking some, elliptical  Weight  Normal BMI Skin  No concerns, see derm annually Substance abuse  none  See Problem List for Assessment and Plan of chronic medical problems.   FU in one year

## 2019-02-04 NOTE — Patient Instructions (Addendum)
Tests ordered today. Your results will be released to [facility] (or called to you) after review.  If any changes need to be made, you will be notified at that same time.  All other Health Maintenance issues reviewed.   All recommended immunizations and age-appropriate screenings are up-to-date or discussed.  No immunization administered today.    Medications reviewed and updated.  Changes include :   none   An carotid artery ultrasound  Please followup in 1 year    Health Maintenance, Male Adopting a healthy lifestyle and getting preventive care are important in promoting health and wellness. Ask your health care provider about:  The right schedule for you to have regular tests and exams.  Things you can do on your own to prevent diseases and keep yourself healthy. What should I know about diet, weight, and exercise? Eat a healthy diet   Eat a diet that includes plenty of vegetables, fruits, low-fat dairy products, and lean protein.  Do not eat a lot of foods that are high in solid fats, added sugars, or sodium. Maintain a healthy weight Body mass index (BMI) is a measurement that can be used to identify possible weight problems. It estimates body fat based on height and weight. Your health care provider can help determine your BMI and help you achieve or maintain a healthy weight. Get regular exercise Get regular exercise. This is one of the most important things you can do for your health. Most adults should:  Exercise for at least 150 minutes each week. The exercise should increase your heart rate and make you sweat (moderate-intensity exercise).  Do strengthening exercises at least twice a week. This is in addition to the moderate-intensity exercise.  Spend less time sitting. Even light physical activity can be beneficial. Watch cholesterol and blood lipids Have your blood tested for lipids and cholesterol at 72 years of age, then have this test every 5 years. You may need  to have your cholesterol levels checked more often if:  Your lipid or cholesterol levels are high.  You are older than 72 years of age.  You are at high risk for heart disease. What should I know about cancer screening? Many types of cancers can be detected early and may often be prevented. Depending on your health history and family history, you may need to have cancer screening at various ages. This may include screening for:  Colorectal cancer.  Prostate cancer.  Skin cancer.  Lung cancer. What should I know about heart disease, diabetes, and high blood pressure? Blood pressure and heart disease  High blood pressure causes heart disease and increases the risk of stroke. This is more likely to develop in people who have high blood pressure readings, are of African descent, or are overweight.  Talk with your health care provider about your target blood pressure readings.  Have your blood pressure checked: ? Every 3-5 years if you are 29-48 years of age. ? Every year if you are 42 years old or older.  If you are between the ages of 59 and 18 and are a current or former smoker, ask your health care provider if you should have a one-time screening for abdominal aortic aneurysm (AAA). Diabetes Have regular diabetes screenings. This checks your fasting blood sugar level. Have the screening done:  Once every three years after age 55 if you are at a normal weight and have a low risk for diabetes.  More often and at a younger age if you are  overweight or have a high risk for diabetes. What should I know about preventing infection? Hepatitis B If you have a higher risk for hepatitis B, you should be screened for this virus. Talk with your health care provider to find out if you are at risk for hepatitis B infection. Hepatitis C Blood testing is recommended for:  Everyone born from [birth year] through 1964.  Anyone with known risk factors for hepatitis C. Sexually transmitted infections  (STIs)  You should be screened each year for STIs, including gonorrhea and chlamydia, if: ? You are sexually active and are younger than 72 years of age. ? You are older than 71 years of age and your health care provider tells you that you are at risk for this type of infection. ? Your sexual activity has changed since you were last screened, and you are at increased risk for chlamydia or gonorrhea. Ask your health care provider if you are at risk.  Ask your health care provider about whether you are at high risk for HIV. Your health care provider may recommend a prescription medicine to help prevent HIV infection. If you choose to take medicine to prevent HIV, you should first get tested for HIV. You should then be tested every 3 months for as long as you are taking the medicine. Follow these instructions at home: Lifestyle  Do not use any products that contain nicotine or tobacco, such as cigarettes, e-cigarettes, and chewing tobacco. If you need help quitting, ask your health care provider.  Do not use street drugs.  Do not share needles.  Ask your health care provider for help if you need support or information about quitting drugs. Alcohol use  Do not drink alcohol if your health care provider tells you not to drink.  If you drink alcohol: ? Limit how much you have to 0-2 drinks a day. ? Be aware of how much alcohol is in your drink. In the [place] one drink equals one 12 oz bottle of beer (355 mL), one 5 oz glass of wine (148 mL), or one 1 oz glass of hard liquor (44 mL). General instructions  Schedule regular health, dental, and eye exams.  Stay current with your vaccines.  Tell your health care provider if: ? You often feel depressed. ? You have ever been abused or do not feel safe at home. Summary  Adopting a healthy lifestyle and getting preventive care are important in promoting health and wellness.  Follow your health care provider's instructions about healthy diet,  exercising, and getting tested or screened for diseases.  Follow your health care provider's instructions on monitoring your cholesterol and blood pressure. This information is not intended to replace advice given to you by your health care provider. Make sure you discuss any questions you have with your health care provider. Document Released: 11/06/2007 Document Revised: 05/03/2018 Document Reviewed: 05/03/2018 Elsevier Patient Education  2020 [employer].

## 2019-02-05 ENCOUNTER — Other Ambulatory Visit (INDEPENDENT_AMBULATORY_CARE_PROVIDER_SITE_OTHER): Payer: PPO

## 2019-02-05 ENCOUNTER — Other Ambulatory Visit: Payer: Self-pay

## 2019-02-05 ENCOUNTER — Encounter: Payer: Self-pay | Admitting: Internal Medicine

## 2019-02-05 ENCOUNTER — Ambulatory Visit (INDEPENDENT_AMBULATORY_CARE_PROVIDER_SITE_OTHER): Payer: PPO | Admitting: Internal Medicine

## 2019-02-05 VITALS — BP 120/70 | HR 67 | Temp 99.1°F | Resp 16 | Ht 67.0 in | Wt 152.8 lb

## 2019-02-05 DIAGNOSIS — Z0000 Encounter for general adult medical examination without abnormal findings: Secondary | ICD-10-CM | POA: Diagnosis not present

## 2019-02-05 DIAGNOSIS — I6523 Occlusion and stenosis of bilateral carotid arteries: Secondary | ICD-10-CM

## 2019-02-05 DIAGNOSIS — E039 Hypothyroidism, unspecified: Secondary | ICD-10-CM

## 2019-02-05 DIAGNOSIS — E782 Mixed hyperlipidemia: Secondary | ICD-10-CM

## 2019-02-05 DIAGNOSIS — R739 Hyperglycemia, unspecified: Secondary | ICD-10-CM

## 2019-02-05 DIAGNOSIS — G479 Sleep disorder, unspecified: Secondary | ICD-10-CM | POA: Diagnosis not present

## 2019-02-05 LAB — TSH: TSH: 2.63 u[IU]/mL (ref 0.35–4.50)

## 2019-02-05 NOTE — Assessment & Plan Note (Signed)
Last lipids adequately controlled  - LDL goal < 70 Will improve diet Continue statin at current dose

## 2019-02-05 NOTE — Assessment & Plan Note (Signed)
Clinically euthyroid Check tsh  Titrate med dose if needed  

## 2019-02-05 NOTE — Assessment & Plan Note (Signed)
Minimally elevated Will monitor

## 2019-02-05 NOTE — Assessment & Plan Note (Signed)
occ anxiety, sleep issues Takes clonazepam 1/2 tab about twice a month Will continue

## 2019-02-05 NOTE — Assessment & Plan Note (Signed)
US ordered

## 2019-02-06 ENCOUNTER — Other Ambulatory Visit: Payer: Self-pay

## 2019-02-06 MED ORDER — LEVOTHYROXINE SODIUM 25 MCG PO TABS: ORAL_TABLET | ORAL | 1 refills | Status: DC

## 2019-02-07 ENCOUNTER — Ambulatory Visit (HOSPITAL_COMMUNITY)
Admission: RE | Admit: 2019-02-07 | Discharge: 2019-02-07 | Disposition: A | Discharge status: Home / Self Care | Payer: PPO | Source: Ambulatory Visit | Attending: Cardiovascular Disease | Admitting: Cardiovascular Disease

## 2019-02-07 ENCOUNTER — Other Ambulatory Visit: Payer: Self-pay

## 2019-02-07 DIAGNOSIS — I6523 Occlusion and stenosis of bilateral carotid arteries: Secondary | ICD-10-CM

## 2019-02-08 ENCOUNTER — Encounter: Payer: Self-pay | Admitting: Internal Medicine

## 2019-02-13 ENCOUNTER — Other Ambulatory Visit: Payer: Self-pay

## 2019-02-13 ENCOUNTER — Ambulatory Visit: Payer: PPO | Admitting: Gastroenterology

## 2019-02-13 ENCOUNTER — Encounter: Payer: Self-pay | Admitting: Gastroenterology

## 2019-02-13 VITALS — BP 120/70 | HR 60 | Temp 98.0°F | Ht 67.0 in | Wt 154.0 lb

## 2019-02-13 DIAGNOSIS — Z86010 Personal history of colonic polyps: Secondary | ICD-10-CM

## 2019-02-13 DIAGNOSIS — M545 Low back pain: Secondary | ICD-10-CM | POA: Diagnosis not present

## 2019-02-13 DIAGNOSIS — R1012 Left upper quadrant pain: Secondary | ICD-10-CM

## 2019-02-13 MED ORDER — GLYCOPYRROLATE 1 MG PO TABS: 1.0000 mg | ORAL_TABLET | Freq: Two times a day (BID) | ORAL | 11 refills | Status: DC

## 2019-02-13 NOTE — Progress Notes (Signed)
    History of Present Illness: This is a 73 year old male with LUQ pain and several BMs/day.  He has not noted left-sided abdominal pain in several months.  He ran out of glycopyrrolate and did well for 3 weeks without abdominal pain.  He resumed glycopyrrolate and feels well.  He notes 3-4 bowel movements per day generally he has a bowel movement shortly following a meal.  He occasionally will have mild constipation and uses MiraLAX about once per month.  He previously took Benefiber but noted bloating and gas so he discontinued it.  He takes Publishing copy daily intermittently.  Colonoscopy 09/2017 - Four 6 to 8 mm polyps in the transverse colon and in the cecum. - One 4 mm polyp in the transverse colon, removed with a cold biopsy forceps. Resected and retrieved. - Mild diverticulosis in the left colon. There was no evidence of diverticular bleeding. - Internal hemorrhoids. - The examination was otherwise normal on direct and retroflexion views.  Current Medications, Allergies, Past Medical History, Past Surgical History, Family History and Social History were reviewed in [employer] record.   Physical Exam: General: Well developed, well nourished, no acute distress Head: Normocephalic and atraumatic Eyes:  sclerae anicteric, EOMI Ears: Normal auditory acuity Mouth: No deformity or lesions Lungs: Clear throughout to auscultation Heart: Regular rate and rhythm; no murmurs, rubs or bruits Abdomen: Soft, non tender and non distended. No masses, hepatosplenomegaly or hernias noted. Normal Bowel sounds Rectal: Not done Musculoskeletal: Symmetrical with no gross deformities  Pulses:  Normal pulses noted Extremities: No clubbing, cyanosis, edema or deformities noted Neurological: Alert oriented x 4, grossly nonfocal Psychological:  Alert and cooperative. Normal mood and affect   Assessment and Recommendations:  1. Left sided abdominal pain has  resolved. Rare constipation.  Suspected IBS.  Glycopyrrolate 1 mg p.o. twice daily as needed abdominal pain bloating.  Florastor twice daily as needed.  2.  Personal history of multiple adenomatous colon polyps.  A 3-year interval surveillance colonoscopy is recommended in May 2022.

## 2019-02-13 NOTE — Patient Instructions (Signed)
We have sent the following medications to your pharmacy for you to pick up at your convenience: glycopyrrolate.   You can use Lotrimin AF cream topically to affected area x 7-10 days for jock itch. If still having itching after 10 days then call your primary care physician.   Thank you for choosing me and [facility].  Pricilla Riffle. Dagoberto Ligas., MD., Marval Regal

## 2019-03-05 ENCOUNTER — Other Ambulatory Visit: Payer: Self-pay | Admitting: Internal Medicine

## 2019-03-06 DIAGNOSIS — H26492 Other secondary cataract, left eye: Secondary | ICD-10-CM | POA: Diagnosis not present

## 2019-03-06 DIAGNOSIS — H35342 Macular cyst, hole, or pseudohole, left eye: Secondary | ICD-10-CM | POA: Diagnosis not present

## 2019-03-06 DIAGNOSIS — M545 Low back pain: Secondary | ICD-10-CM | POA: Diagnosis not present

## 2019-03-27 DIAGNOSIS — H26492 Other secondary cataract, left eye: Secondary | ICD-10-CM | POA: Diagnosis not present

## 2019-03-27 DIAGNOSIS — M545 Low back pain: Secondary | ICD-10-CM | POA: Diagnosis not present

## 2019-04-16 DIAGNOSIS — Z20828 Contact with and (suspected) exposure to other viral communicable diseases: Secondary | ICD-10-CM | POA: Diagnosis not present

## 2019-04-24 DIAGNOSIS — M545 Low back pain: Secondary | ICD-10-CM | POA: Diagnosis not present

## 2019-05-21 DIAGNOSIS — M545 Low back pain: Secondary | ICD-10-CM | POA: Diagnosis not present

## 2019-05-22 ENCOUNTER — Other Ambulatory Visit: Payer: Self-pay | Admitting: Internal Medicine

## 2019-05-23 DIAGNOSIS — Z20828 Contact with and (suspected) exposure to other viral communicable diseases: Secondary | ICD-10-CM | POA: Diagnosis not present

## 2019-06-08 ENCOUNTER — Encounter: Payer: Self-pay | Admitting: Internal Medicine

## 2019-06-11 DIAGNOSIS — M545 Low back pain: Secondary | ICD-10-CM | POA: Diagnosis not present

## 2019-06-19 ENCOUNTER — Ambulatory Visit: Payer: PPO

## 2019-07-03 DIAGNOSIS — M545 Low back pain: Secondary | ICD-10-CM | POA: Diagnosis not present

## 2019-07-25 DIAGNOSIS — M545 Low back pain: Secondary | ICD-10-CM | POA: Diagnosis not present

## 2019-08-15 DIAGNOSIS — M545 Low back pain: Secondary | ICD-10-CM | POA: Diagnosis not present

## 2019-08-23 ENCOUNTER — Other Ambulatory Visit: Payer: Self-pay

## 2019-08-23 MED ORDER — SIMVASTATIN 40 MG PO TABS: 40.0000 mg | ORAL_TABLET | Freq: Every day | ORAL | 1 refills | Status: DC

## 2019-09-03 ENCOUNTER — Other Ambulatory Visit: Payer: Self-pay | Admitting: Internal Medicine

## 2019-09-04 ENCOUNTER — Telehealth: Payer: Self-pay

## 2019-09-04 MED ORDER — LEVOTHYROXINE SODIUM 25 MCG PO TABS: 25.0000 ug | ORAL_TABLET | Freq: Every day | ORAL | 1 refills | Status: DC

## 2019-09-04 NOTE — Telephone Encounter (Signed)
Updated script sent rx to pharmacy../[facility]

## 2019-09-04 NOTE — Telephone Encounter (Signed)
New message   [facility] mail order calling back   Ref # [id number]   levothyroxine (SYNTHROID) 25 MCG tablet manufactured has changed [employer] is manufactured they have to dispensed medicaiton to the patient need an okay from the MD

## 2019-09-04 NOTE — Telephone Encounter (Signed)
New message   Mail order is asking for direction on medication   1.Medication Requested:levothyroxine (SYNTHROID) 25 MCG tablet  2. Pharmacy (Name, Street, [facility] (physical sciences) ([place], [place]

## 2019-09-04 NOTE — Telephone Encounter (Signed)
ok 

## 2019-09-05 DIAGNOSIS — M545 Low back pain: Secondary | ICD-10-CM | POA: Diagnosis not present

## 2019-09-06 ENCOUNTER — Telehealth: Payer: Self-pay | Admitting: Internal Medicine

## 2019-09-06 NOTE — Telephone Encounter (Signed)
New message:   Pt is calling and states this medication levothyroxine (SYNTHROID) 25 MCG tablet has changed the manufacturer and needs a prior authorization for the pt to be able to get this medication per [employer] order pharmacy

## 2019-09-07 NOTE — Telephone Encounter (Signed)
F/u  The  Patient is calling back stating Dr. Quay Burow need to called [facility] ([place], [place].   Phone:  [phone]    The patient voiced he is almost out of his medication.

## 2019-09-07 NOTE — Telephone Encounter (Signed)
Gave ok to pharmacy to fill under a different manufacturer. LVM letting pt know.

## 2019-09-26 DIAGNOSIS — M545 Low back pain: Secondary | ICD-10-CM | POA: Diagnosis not present

## 2019-10-17 DIAGNOSIS — M545 Low back pain: Secondary | ICD-10-CM | POA: Diagnosis not present

## 2019-11-14 DIAGNOSIS — L821 Other seborrheic keratosis: Secondary | ICD-10-CM | POA: Diagnosis not present

## 2019-11-14 DIAGNOSIS — L570 Actinic keratosis: Secondary | ICD-10-CM | POA: Diagnosis not present

## 2019-11-14 DIAGNOSIS — D225 Melanocytic nevi of trunk: Secondary | ICD-10-CM | POA: Diagnosis not present

## 2019-11-14 DIAGNOSIS — M545 Low back pain: Secondary | ICD-10-CM | POA: Diagnosis not present

## 2019-11-14 DIAGNOSIS — Z85828 Personal history of other malignant neoplasm of skin: Secondary | ICD-10-CM | POA: Diagnosis not present

## 2019-11-28 DIAGNOSIS — M545 Low back pain: Secondary | ICD-10-CM | POA: Diagnosis not present

## 2019-12-19 DIAGNOSIS — M545 Low back pain: Secondary | ICD-10-CM | POA: Diagnosis not present

## 2020-01-07 DIAGNOSIS — M545 Low back pain: Secondary | ICD-10-CM | POA: Diagnosis not present

## 2020-01-19 ENCOUNTER — Encounter: Payer: Self-pay | Admitting: Internal Medicine

## 2020-01-19 DIAGNOSIS — E038 Other specified hypothyroidism: Secondary | ICD-10-CM

## 2020-01-19 DIAGNOSIS — Z0000 Encounter for general adult medical examination without abnormal findings: Secondary | ICD-10-CM

## 2020-01-19 DIAGNOSIS — E782 Mixed hyperlipidemia: Secondary | ICD-10-CM

## 2020-01-19 DIAGNOSIS — R739 Hyperglycemia, unspecified: Secondary | ICD-10-CM

## 2020-01-22 ENCOUNTER — Other Ambulatory Visit: Payer: Self-pay | Admitting: Internal Medicine

## 2020-01-23 MED ORDER — CLONAZEPAM 0.5 MG PO TABS: 0.5000 mg | ORAL_TABLET | Freq: Every evening | ORAL | 0 refills | Status: DC | PRN

## 2020-01-30 ENCOUNTER — Other Ambulatory Visit (INDEPENDENT_AMBULATORY_CARE_PROVIDER_SITE_OTHER): Payer: PPO

## 2020-01-30 DIAGNOSIS — R739 Hyperglycemia, unspecified: Secondary | ICD-10-CM

## 2020-01-30 DIAGNOSIS — Z0000 Encounter for general adult medical examination without abnormal findings: Secondary | ICD-10-CM

## 2020-01-30 DIAGNOSIS — M545 Low back pain: Secondary | ICD-10-CM | POA: Diagnosis not present

## 2020-01-30 DIAGNOSIS — E038 Other specified hypothyroidism: Secondary | ICD-10-CM

## 2020-01-30 DIAGNOSIS — E782 Mixed hyperlipidemia: Secondary | ICD-10-CM

## 2020-01-30 LAB — LIPID PANEL
Cholesterol: 212 mg/dL — ABNORMAL HIGH (ref 0–200)
HDL: 78.3 mg/dL (ref >39.00)
LDL Cholesterol: 115 mg/dL — ABNORMAL HIGH (ref 0–99)
NonHDL: 133.48
Total CHOL/HDL Ratio: 3
Triglycerides: 92 mg/dL (ref 0.0–149.0)
VLDL: 18.4 mg/dL (ref 0.0–40.0)

## 2020-01-30 LAB — COMPREHENSIVE METABOLIC PANEL
ALT: 21 U/L (ref 0–53)
AST: 30 U/L (ref 0–37)
Albumin: 4.7 g/dL (ref 3.5–5.2)
Alkaline Phosphatase: 57 U/L (ref 39–117)
BUN: 18 mg/dL (ref 6–23)
CO2: 26 mEq/L (ref 19–32)
Calcium: 9.7 mg/dL (ref 8.4–10.5)
Chloride: 106 mEq/L (ref 96–112)
Creatinine, Ser: 0.9 mg/dL (ref 0.40–1.50)
GFR: 82.73 mL/min (ref >60.00)
Glucose, Bld: 84 mg/dL (ref 70–99)
Potassium: 4.3 mEq/L (ref 3.5–5.1)
Sodium: 141 mEq/L (ref 135–145)
Total Bilirubin: 0.8 mg/dL (ref 0.2–1.2)
Total Protein: 7.8 g/dL (ref 6.0–8.3)

## 2020-01-30 LAB — CBC WITH DIFFERENTIAL/PLATELET
Basophils Absolute: 0.1 10*3/uL (ref 0.0–0.1)
Basophils Relative: 1.1 % (ref 0.0–3.0)
Eosinophils Absolute: 0.1 10*3/uL (ref 0.0–0.7)
Eosinophils Relative: 1.6 % (ref 0.0–5.0)
HCT: 43.6 % (ref 39.0–52.0)
Hemoglobin: 14.9 g/dL (ref 13.0–17.0)
Lymphocytes Relative: 15.5 % (ref 12.0–46.0)
Lymphs Abs: 1.4 10*3/uL (ref 0.7–4.0)
MCHC: 34.2 g/dL (ref 30.0–36.0)
MCV: 98.7 fl (ref 78.0–100.0)
Monocytes Absolute: 1 10*3/uL (ref 0.1–1.0)
Monocytes Relative: 11.2 % (ref 3.0–12.0)
Neutro Abs: 6.3 10*3/uL (ref 1.4–7.7)
Neutrophils Relative %: 70.6 % (ref 43.0–77.0)
Platelets: 264 10*3/uL (ref 150.0–400.0)
RBC: 4.42 Mil/uL (ref 4.22–5.81)
RDW: 13.6 % (ref 11.5–15.5)
WBC: 8.9 10*3/uL (ref 4.0–10.5)

## 2020-01-30 LAB — TSH: TSH: 6.77 u[IU]/mL — ABNORMAL HIGH (ref 0.35–4.50)

## 2020-01-30 LAB — HEMOGLOBIN A1C: Hgb A1c MFr Bld: 5.4 % (ref 4.6–6.5)

## 2020-02-10 NOTE — Progress Notes (Signed)
Subjective:    Patient ID: Jeremy Waters, male    DOB: 1945-06-23, 74 y.o.   MRN: [id number]  HPI He is here for a physical exam.   He has no concerns.    Medications and allergies reviewed with patient and updated if appropriate.  Patient Active Problem List   Diagnosis Date Noted  . LUQ pain 01/16/2019  . Right cervical radiculopathy 03/03/2017  . Carotid artery disease ([place]) 09/15/2016  . Spinal stenosis, lumbar 10/08/2015  . Sensorineural hearing loss of both ears 04/10/2014  . Organic impotence 04/10/2014  . Hyperglycemia 04/02/2014  . S/P left TKA 07/03/2013  . Left knee DJD 06/07/2013  . Hypothyroidism 03/03/2012  . Axillary mass, right 04/07/2011  . ROSACEA 03/19/2010  . ARTHRALGIA 03/19/2010  . CERVICALGIA 09/05/2009  . CARPAL TUNNEL SYNDROME 06/17/2008  . DEGENERATIVE JOINT DISEASE, GENERALIZED 06/17/2008  . CERVICAL RADICULOPATHY, LEFT 06/17/2008  . Disturbance in sleep behavior 03/04/2008  . PROSTATE CANCER, HX OF 03/04/2008  . Hyperlipidemia 06/15/2007  . DIVERTICULOSIS, COLON W/O HEM 03/03/2007    Current Outpatient Medications on File Prior to Visit  Medication Sig Dispense Refill  . beta carotene 25000 UNIT capsule Take 25,000 Units by mouth daily.    . clonazePAM (KLONOPIN) 0.5 MG tablet Take 1 tablet (0.5 mg total) by mouth at bedtime as needed. 30 tablet 0  . fluticasone (FLONASE) 50 MCG/ACT nasal spray INSTILL ONE SPRAY INTO BOTH NOSTRILS DAILY 48 g 1  . levothyroxine (SYNTHROID) 25 MCG tablet Take 1 tablet (25 mcg total) by mouth daily before breakfast. Annual appt due in Sept must see provider for future refills 90 tablet 1  . pyridOXINE (VITAMIN B-6) 100 MG tablet Take 100 mg by mouth daily.    . sildenafil (REVATIO) 20 MG tablet Take 2-5 tablets daily as needed for erectile dysfunction    . simvastatin (ZOCOR) 40 MG tablet Take 1 tablet (40 mg total) by mouth at bedtime. 90 tablet 1  . vitamin C (ASCORBIC ACID) 500 MG tablet Take 500 mg by  mouth 2 (two) times daily.     . vitamin E 400 UNIT capsule Take 400 Units by mouth daily.    Marland Kitchen saccharomyces boulardii (FLORASTOR) 250 MG capsule Take 1 capsule (250 mg total) by mouth 2 (two) times daily. (Patient taking differently: Take 250 mg by mouth as needed. ) 60 capsule 11   No current facility-administered medications on file prior to visit.    Past Medical History:  Diagnosis Date  . Adenomatous colon polyp 09/2007  . Arthritis    OA / PAIN LEFT KNEE  . Cancer ([place])   . Diverticulosis of colon    w/o hemorrage  . Heart murmur    TOLD HE HAS A SLIGHT MURMUR  . Hyperlipidemia   . Inguinal hernia    left side   . Thyroid disease     Past Surgical History:  Procedure Laterality Date  . BACK SURGERY  05/12/2005   L5  . COLONOSCOPY  1999   Negative; Dr Fuller Plan  . COLONOSCOPY  2004   tics, hemorrhoids  . COLONOSCOPY  2009   polyps ([place]  . HERNIA REPAIR  1987  . HERNIA REPAIR  10/2003  . KNEE SURGERY  1969   Patellar fracture fragment Lt  . LAMINECTOMY  2000   L4-5  . LUMBAR LAMINECTOMY/DECOMPRESSION MICRODISCECTOMY Right 10/08/2015   Procedure: MICRO LUMBAR DECOMPRESSION L4-L5 AND L5-S1 ON RIGHT ;  Surgeon: Susa Day, MD;  Location: [facility];  Service: Orthopedics;  Laterality: Right;  . MASS EXCISION  04/26/2011   Procedure: MINOR EXCISION OF MASS;  Surgeon: Earnstine Regal, MD;  Location: [facility];  Service: General;  Laterality: Right;  Excise subcutaneous mass right axilla Minor Room  . prostaectomy    . PROSTATECTOMY  04/16/2005  . TONSILLECTOMY    . TOTAL KNEE ARTHROPLASTY Left 07/03/2013   Procedure: LEFT TOTAL KNEE ARTHROPLASTY;  Surgeon: Mauri Pole, MD;  Location: [facility];  Service: Orthopedics;  Laterality: Left;    Social History   Socioeconomic History  . Marital status: Married    Spouse name: Not on file  . Number of children: 2  . Years of education: Not on file  . Highest education level: Not on file  Occupational History   . Occupation: insur exe    Employer: [employer]  Tobacco Use  . Smoking status: Former Research scientist (life sciences)  . Smokeless tobacco: Never Used  . Tobacco comment: stopped smoking cigarettes 1983, 1 cigar /day 2001-2013  Vaping Use  . Vaping Use: Never used  Substance and Sexual Activity  . Alcohol use: Yes    Alcohol/week: 7.0 standard drinks    Types: 7 Standard drinks or equivalent per week    Comment: scotch or wine daily  . Drug use: No  . Sexual activity: Yes  Other Topics Concern  . Not on file  Social History Narrative   Exercises daily            Social Determinants of Health   Financial Resource Strain:   . Difficulty of Paying Living Expenses: Not on file  Food Insecurity:   . Worried About Charity fundraiser in the Last Year: Not on file  . Ran Out of Food in the Last Year: Not on file  Transportation Needs:   . Lack of Transportation (Medical): Not on file  . Lack of Transportation (Non-Medical): Not on file  Physical Activity:   . Days of Exercise per Week: Not on file  . Minutes of Exercise per Session: Not on file  Stress:   . Feeling of Stress : Not on file  Social Connections:   . Frequency of Communication with Friends and Family: Not on file  . Frequency of Social Gatherings with Friends and Family: Not on file  . Attends Religious Services: Not on file  . Active Member of Clubs or Organizations: Not on file  . Attends Archivist Meetings: Not on file  . Marital Status: Not on file    Family History  Problem Relation Age of Onset  . Lung cancer Mother   . COPD Mother   . Cancer Father        Bladder Cancer  . Prostate cancer Father   . Colon cancer Father 109  . Urolithiasis Father   . Hypertension Paternal Uncle   . Alcohol abuse Paternal Uncle   . Stroke Paternal Uncle 6  . Lung cancer Sister   . Asthma Neg Hx   . Heart disease Neg Hx     Review of Systems  Constitutional: Negative for chills and fever.  Eyes: Negative for  visual disturbance.  Respiratory: Negative for cough, shortness of breath and wheezing.   Cardiovascular: Negative for chest pain, palpitations and leg swelling.  Gastrointestinal: Positive for constipation (occ). Negative for abdominal pain, blood in stool, diarrhea and nausea.       Occ GERD - takes pepcid prn  Genitourinary: Negative for difficulty urinating and  dysuria.  Musculoskeletal: Positive for arthralgias. Negative for back pain.  Skin: Negative for color change and rash.  Neurological: Negative for light-headedness and headaches.  Psychiatric/Behavioral: Negative for dysphoric mood. The patient is not nervous/anxious.        Objective:   Vitals:   02/11/20 1315  BP: 130/68  Pulse: 79  Temp: 97.7 F (36.5 C)  SpO2: 96%   Filed Weights   02/11/20 1315  Weight: 152 lb 3.2 oz (69 kg)   Body mass index is 23.84 kg/m.  BP Readings from Last 3 Encounters:  02/11/20 130/68  02/13/19 120/70  02/05/19 120/70    Wt Readings from Last 3 Encounters:  02/11/20 152 lb 3.2 oz (69 kg)  02/13/19 154 lb (69.9 kg)  02/05/19 152 lb 12.8 oz (69.3 kg)     Physical Exam Constitutional: He appears well-developed and well-nourished. No distress.  HENT:  Head: Normocephalic and atraumatic.  Right Ear: External ear normal.  Left Ear: External ear normal.  Mouth/Throat: Oropharynx is clear and moist.  Normal ear canals and TM b/l  Eyes: Conjunctivae and EOM are normal.  Neck: Neck supple. No tracheal deviation present. No thyromegaly present.  R carotid bruit  Cardiovascular: Normal rate, regular rhythm, normal heart sounds and intact distal pulses.   2/6 systolic murmur heard. Pulmonary/Chest: Effort normal and breath sounds normal. No respiratory distress. He has no wheezes. He has no rales.  Abdominal: Soft. He exhibits no distension. There is no tenderness.  Genitourinary: deferred  Musculoskeletal: He exhibits no edema.  Lymphadenopathy:   He has no cervical adenopathy.   Skin: Skin is warm and dry. He is not diaphoretic.  Psychiatric: He has a normal mood and affect. His behavior is normal.         Assessment & Plan:   Physical exam: Screening blood work  reviewed Immunizations  Flu vaccine today, had covid Colonoscopy   Up to date  Eye exams   Up to date  Exercise   Regular - 4 mi on elliptical Weight  normal Substance abuse   none    Sees derm annually   See Problem List for Assessment and Plan of chronic medical problems.   This visit occurred during the SARS-CoV-2 public health emergency.  Safety protocols were in place, including screening questions prior to the visit, additional usage of staff PPE, and extensive cleaning of exam room while observing appropriate contact time as indicated for disinfecting solutions.

## 2020-02-10 NOTE — Patient Instructions (Addendum)
Blood work was reviewed.   Have blood work done in about three months.  All other Health Maintenance issues reviewed.   All recommended immunizations and age-appropriate screenings are up-to-date or discussed.  Flu immunization administered today.    Medications reviewed and updated.  Changes include :   Increase levothyroxine to 50 mcg daily  Your prescription(s) have been submitted to your pharmacy. Please take as directed and contact our office if you believe you are having problem(s) with the medication(s).    Please followup in 1 year   Health Maintenance, Male Adopting a healthy lifestyle and getting preventive care are important in promoting health and wellness. Ask your health care provider about:  The right schedule for you to have regular tests and exams.  Things you can do on your own to prevent diseases and keep yourself healthy. What should I know about diet, weight, and exercise? Eat a healthy diet   Eat a diet that includes plenty of vegetables, fruits, low-fat dairy products, and lean protein.  Do not eat a lot of foods that are high in solid fats, added sugars, or sodium. Maintain a healthy weight Body mass index (BMI) is a measurement that can be used to identify possible weight problems. It estimates body fat based on height and weight. Your health care provider can help determine your BMI and help you achieve or maintain a healthy weight. Get regular exercise Get regular exercise. This is one of the most important things you can do for your health. Most adults should:  Exercise for at least 150 minutes each week. The exercise should increase your heart rate and make you sweat (moderate-intensity exercise).  Do strengthening exercises at least twice a week. This is in addition to the moderate-intensity exercise.  Spend less time sitting. Even light physical activity can be beneficial. Watch cholesterol and blood lipids Have your blood tested for lipids and  cholesterol at 73 years of age, then have this test every 5 years. You may need to have your cholesterol levels checked more often if:  Your lipid or cholesterol levels are high.  You are older than 73 years of age.  You are at high risk for heart disease. What should I know about cancer screening? Many types of cancers can be detected early and may often be prevented. Depending on your health history and family history, you may need to have cancer screening at various ages. This may include screening for:  Colorectal cancer.  Prostate cancer.  Skin cancer.  Lung cancer. What should I know about heart disease, diabetes, and high blood pressure? Blood pressure and heart disease  High blood pressure causes heart disease and increases the risk of stroke. This is more likely to develop in people who have high blood pressure readings, are of African descent, or are overweight.  Talk with your health care provider about your target blood pressure readings.  Have your blood pressure checked: ? Every 3-5 years if you are 80-66 years of age. ? Every year if you are 26 years old or older.  If you are between the ages of 6 and 23 and are a current or former smoker, ask your health care provider if you should have a one-time screening for abdominal aortic aneurysm (AAA). Diabetes Have regular diabetes screenings. This checks your fasting blood sugar level. Have the screening done:  Once every three years after age 63 if you are at a normal weight and have a low risk for diabetes.  More  often and at a younger age if you are overweight or have a high risk for diabetes. What should I know about preventing infection? Hepatitis B If you have a higher risk for hepatitis B, you should be screened for this virus. Talk with your health care provider to find out if you are at risk for hepatitis B infection. Hepatitis C Blood testing is recommended for:  Everyone born from [birth year] through  1965.  Anyone with known risk factors for hepatitis C. Sexually transmitted infections (STIs)  You should be screened each year for STIs, including gonorrhea and chlamydia, if: ? You are sexually active and are younger than 73 years of age. ? You are older than 73 years of age and your health care provider tells you that you are at risk for this type of infection. ? Your sexual activity has changed since you were last screened, and you are at increased risk for chlamydia or gonorrhea. Ask your health care provider if you are at risk.  Ask your health care provider about whether you are at high risk for HIV. Your health care provider may recommend a prescription medicine to help prevent HIV infection. If you choose to take medicine to prevent HIV, you should first get tested for HIV. You should then be tested every 3 months for as long as you are taking the medicine. Follow these instructions at home: Lifestyle  Do not use any products that contain nicotine or tobacco, such as cigarettes, e-cigarettes, and chewing tobacco. If you need help quitting, ask your health care provider.  Do not use street drugs.  Do not share needles.  Ask your health care provider for help if you need support or information about quitting drugs. Alcohol use  Do not drink alcohol if your health care provider tells you not to drink.  If you drink alcohol: ? Limit how much you have to 0-2 drinks a day. ? Be aware of how much alcohol is in your drink. In the [place] one drink equals one 12 oz bottle of beer (355 mL), one 5 oz glass of wine (148 mL), or one 1 oz glass of hard liquor (44 mL). General instructions  Schedule regular health, dental, and eye exams.  Stay current with your vaccines.  Tell your health care provider if: ? You often feel depressed. ? You have ever been abused or do not feel safe at home. Summary  Adopting a healthy lifestyle and getting preventive care are important in promoting  health and wellness.  Follow your health care provider's instructions about healthy diet, exercising, and getting tested or screened for diseases.  Follow your health care provider's instructions on monitoring your cholesterol and blood pressure. This information is not intended to replace advice given to you by your health care provider. Make sure you discuss any questions you have with your health care provider. Document Revised: 05/03/2018 Document Reviewed: 05/03/2018 Elsevier Patient Education  2020 [employer].

## 2020-02-11 ENCOUNTER — Encounter: Payer: Self-pay | Admitting: Internal Medicine

## 2020-02-11 ENCOUNTER — Other Ambulatory Visit: Payer: Self-pay

## 2020-02-11 ENCOUNTER — Ambulatory Visit (INDEPENDENT_AMBULATORY_CARE_PROVIDER_SITE_OTHER): Payer: PPO | Admitting: Internal Medicine

## 2020-02-11 VITALS — BP 130/68 | HR 79 | Temp 97.7°F | Wt 152.2 lb

## 2020-02-11 DIAGNOSIS — Z23 Encounter for immunization: Secondary | ICD-10-CM | POA: Diagnosis not present

## 2020-02-11 DIAGNOSIS — I6523 Occlusion and stenosis of bilateral carotid arteries: Secondary | ICD-10-CM

## 2020-02-11 DIAGNOSIS — E038 Other specified hypothyroidism: Secondary | ICD-10-CM

## 2020-02-11 DIAGNOSIS — G479 Sleep disorder, unspecified: Secondary | ICD-10-CM | POA: Diagnosis not present

## 2020-02-11 DIAGNOSIS — Z0000 Encounter for general adult medical examination without abnormal findings: Secondary | ICD-10-CM | POA: Diagnosis not present

## 2020-02-11 DIAGNOSIS — R739 Hyperglycemia, unspecified: Secondary | ICD-10-CM

## 2020-02-11 DIAGNOSIS — E782 Mixed hyperlipidemia: Secondary | ICD-10-CM | POA: Diagnosis not present

## 2020-02-11 MED ORDER — SIMVASTATIN 40 MG PO TABS
40.0000 mg | ORAL_TABLET | Freq: Every day | ORAL | 3 refills | Status: DC
Start: 2020-02-11 — End: 2020-11-19

## 2020-02-11 MED ORDER — LEVOTHYROXINE SODIUM 50 MCG PO TABS: 50.0000 ug | ORAL_TABLET | Freq: Every day | ORAL | 3 refills | Status: DC

## 2020-02-11 NOTE — Assessment & Plan Note (Signed)
Chronic LDL slightly higher this year - ? From elevated tsh/hypothyroidism continue current statin - zocor 40 mg

## 2020-02-11 NOTE — Assessment & Plan Note (Signed)
Chronic tsh high -- inc dose to 50 mcg daily Recheck tsh in about 3 months

## 2020-02-11 NOTE — Assessment & Plan Note (Signed)
Chronic Controlled, stable Continue current dose of medication - 1/2 clonazepam prn

## 2020-02-11 NOTE — Assessment & Plan Note (Addendum)
Chronic Has been stable Consider rechecking next year Continue statin - LDL a little higher this year, which may be from his hypothyroidism

## 2020-02-11 NOTE — Assessment & Plan Note (Signed)
Chronic  Lab Results  Component Value Date   HGBA1C 5.4 01/30/2020   In normal range

## 2020-02-13 ENCOUNTER — Telehealth: Payer: Self-pay | Admitting: Internal Medicine

## 2020-02-13 NOTE — Telephone Encounter (Signed)
ok 

## 2020-02-13 NOTE — Telephone Encounter (Signed)
[facility] called and said that they had filled levothyroxine (SYNTHROID) 50 MCG tablet  With [facility] and they have [place] and was needing authorization to dispense medication.   They can be reached at [phone] and the reference number is [id number].

## 2020-02-13 NOTE — Telephone Encounter (Signed)
Notified [facility] pharmacy w/MD response.Marland KitchenJohny Chess

## 2020-02-20 DIAGNOSIS — M545 Low back pain: Secondary | ICD-10-CM | POA: Diagnosis not present

## 2020-03-12 DIAGNOSIS — M545 Low back pain, unspecified: Secondary | ICD-10-CM | POA: Diagnosis not present

## 2020-03-31 ENCOUNTER — Encounter: Payer: Self-pay | Admitting: Internal Medicine

## 2020-04-02 DIAGNOSIS — M545 Low back pain, unspecified: Secondary | ICD-10-CM | POA: Diagnosis not present

## 2020-04-11 ENCOUNTER — Ambulatory Visit: Payer: PPO | Admitting: Physician Assistant

## 2020-04-11 ENCOUNTER — Encounter: Payer: Self-pay | Admitting: Physician Assistant

## 2020-04-11 VITALS — BP 114/62 | HR 72 | Ht 67.0 in | Wt 150.0 lb

## 2020-04-11 DIAGNOSIS — Z86010 Personal history of colonic polyps: Secondary | ICD-10-CM

## 2020-04-11 DIAGNOSIS — K582 Mixed irritable bowel syndrome: Secondary | ICD-10-CM

## 2020-04-11 MED ORDER — GLYCOPYRROLATE 2 MG PO TABS: 2.0000 mg | ORAL_TABLET | Freq: Every morning | ORAL | 2 refills | Status: DC

## 2020-04-11 NOTE — Progress Notes (Signed)
Subjective:    Patient ID: SHANDY VI, male    DOB: 1945/12/31, 74 y.o.   MRN: [id number]  HPI Jeremy is a 74 year old white male, established with Dr. Fuller Plan who was last seen here in September 2020.  He has history of IBS and at that time was having issues with postprandial urge for bowel movements, and intermittent abdominal pain/cramping.  He was given a trial of glycopyrrolate. He last had colonoscopy in May 2019 with removal of 5 polyps largest 6 to 8 mm.  All were adenomatous and he is indicated for 3-year interval follow-up.  He also has left colon diverticulosis and internal hemorrhoids. Patient said he had not been having any problems with abdominal pain over the past many months so stopped using the glycopyrrolate.  Now over the past couple of months he is sometimes having up to 5-6 bowel movements per day.  He says sometimes this is just an urgency for bowel movement but he goes to the bathroom and only passes gas.  Is not having any bloating or abdominal distention.  No melena or hematochezia.  He will also have intermittent spells with constipation and straining and actually had to use an enema a couple of weeks ago.  He denies any abdominal pain or cramping currently. Other medical issues include coronary artery disease, history of prostate CA and prior history of diverticulitis.  Review of Systems Pertinent positive and negative review of systems were noted in the above HPI section.  All other review of systems was otherwise negative.  Outpatient Encounter Medications as of 04/11/2020  Medication Sig  . beta carotene 25000 UNIT capsule Take 25,000 Units by mouth daily.  . clonazePAM (KLONOPIN) 0.5 MG tablet Take 1 tablet (0.5 mg total) by mouth at bedtime as needed.  . fluticasone (FLONASE) 50 MCG/ACT nasal spray INSTILL ONE SPRAY INTO BOTH NOSTRILS DAILY  . levothyroxine (SYNTHROID) 50 MCG tablet Take 1 tablet (50 mcg total) by mouth daily.  Marland Kitchen pyridOXINE (VITAMIN B-6) 100 MG  tablet Take 100 mg by mouth daily.  . sildenafil (REVATIO) 20 MG tablet Take 2-5 tablets daily as needed for erectile dysfunction  . simvastatin (ZOCOR) 40 MG tablet Take 1 tablet (40 mg total) by mouth at bedtime.  . vitamin C (ASCORBIC ACID) 500 MG tablet Take 500 mg by mouth 2 (two) times daily.   . vitamin E 400 UNIT capsule Take 400 Units by mouth daily.  Marland Kitchen glycopyrrolate (ROBINUL) 2 MG tablet Take 1 tablet (2 mg total) by mouth in the morning.   No facility-administered encounter medications on file as of 04/11/2020.   Allergies  Allergen Reactions  . Atorvastatin Other (See Comments)    Increased LFTs   Patient Active Problem List   Diagnosis Date Noted  . LUQ pain 01/16/2019  . Right cervical radiculopathy 03/03/2017  . Carotid artery disease ([place]) 09/15/2016  . Spinal stenosis, lumbar 10/08/2015  . Sensorineural hearing loss of both ears 04/10/2014  . Organic impotence 04/10/2014  . Hyperglycemia 04/02/2014  . S/P left TKA 07/03/2013  . Left knee DJD 06/07/2013  . Hypothyroidism 03/03/2012  . Axillary mass, right 04/07/2011  . ROSACEA 03/19/2010  . ARTHRALGIA 03/19/2010  . CERVICALGIA 09/05/2009  . CARPAL TUNNEL SYNDROME 06/17/2008  . DEGENERATIVE JOINT DISEASE, GENERALIZED 06/17/2008  . CERVICAL RADICULOPATHY, LEFT 06/17/2008  . Disturbance in sleep behavior 03/04/2008  . PROSTATE CANCER, HX OF 03/04/2008  . Hyperlipidemia 06/15/2007  . DIVERTICULOSIS, COLON W/O HEM 03/03/2007   Social History  Socioeconomic History  . Marital status: Married    Spouse name: Not on file  . Number of children: 2  . Years of education: Not on file  . Highest education level: Not on file  Occupational History  . Occupation: insur exe    Employer: [employer]  Tobacco Use  . Smoking status: Former Research scientist (life sciences)  . Smokeless tobacco: Never Used  . Tobacco comment: stopped smoking cigarettes 1983, 1 cigar /day 2001-2013  Vaping Use  . Vaping Use: Never used  Substance  and Sexual Activity  . Alcohol use: Yes    Alcohol/week: 7.0 - 14.0 standard drinks    Types: 7 - 14 Standard drinks or equivalent per week    Comment: scotch or wine daily  . Drug use: No  . Sexual activity: Yes  Other Topics Concern  . Not on file  Social History Narrative   Exercises daily            Social Determinants of Health   Financial Resource Strain:   . Difficulty of Paying Living Expenses: Not on file  Food Insecurity:   . Worried About Charity fundraiser in the Last Year: Not on file  . Ran Out of Food in the Last Year: Not on file  Transportation Needs:   . Lack of Transportation (Medical): Not on file  . Lack of Transportation (Non-Medical): Not on file  Physical Activity:   . Days of Exercise per Week: Not on file  . Minutes of Exercise per Session: Not on file  Stress:   . Feeling of Stress : Not on file  Social Connections:   . Frequency of Communication with Friends and Family: Not on file  . Frequency of Social Gatherings with Friends and Family: Not on file  . Attends Religious Services: Not on file  . Active Member of Clubs or Organizations: Not on file  . Attends Archivist Meetings: Not on file  . Marital Status: Not on file  Intimate Partner Violence:   . Fear of Current or Ex-Partner: Not on file  . Emotionally Abused: Not on file  . Physically Abused: Not on file  . Sexually Abused: Not on file    Jeremy Waters's family history includes Alcohol abuse in his paternal uncle; COPD in his mother; Cancer in his father; Colon cancer (age of onset: 67) in his father; Hypertension in his paternal uncle; Lung cancer in his mother and sister; Prostate cancer in his father; Stroke (age of onset: 17) in his paternal uncle; Urolithiasis in his father.      Objective:    Vitals:   04/11/20 1054  BP: 114/62  Pulse: 72    Physical Exam Well-developed well-nourished older WM in no acute distress.  Height, Weight,150 BMI23.4  HEENT;  nontraumatic normocephalic, EOMI, PER R LA, sclera anicteric. Oropharynx; Neck; supple, no JVD Cardiovascular; regular rate and rhythm with S1-S2, no murmur rub or gallop Pulmonary; Clear bilaterally Abdomen; soft, nontender, nondistended, no palpable mass or hepatosplenomegaly, bowel sounds are active Rectal; Skin; benign exam, no jaundice rash or appreciable lesions Extremities; no clubbing cyanosis or edema skin warm and dry Neuro/Psych; alert and oriented x4, grossly nonfocal mood and affect appropriate       Assessment & Plan:   #20 74 year old white male with IBS who comes in now with change in bowel habits with more frequent stools and frequent urge for bowel movement though often only passing flatus.  This is associated with intermittent constipation.  No abdominal pain  Symptoms consistent with IBS/alternating  #2 history of adenomatous colon polyps-up-to-date with colonoscopy last done May 2019 indicated for 3-year interval follow-up/May 2022 3.  Diverticulosis 4.  Internal hemorrhoids 5.  History of prostate CA 6.  Coronary artery disease  Plan; start Benefiber 1 dose daily in a glass of water Restart glycopyrrolate 2 mg 1 p.o. every morning which hopefully will help with the urgency sensation and more frequent stools. Patient was asked to call back in 2 to 3 weeks if this regimen is not beneficial and asked for advice. Plan colonoscopy May 2022 with Dr. Fuller Plan.   Eusebio Blazejewski Genia Harold PA-C 04/11/2020   Cc: Binnie Rail, MD

## 2020-04-11 NOTE — Patient Instructions (Addendum)
If you are age 73 or older, your body mass index should be between 23-30. Your Body mass index is 23.49 kg/m. If this is out of the aforementioned range listed, please consider follow up with your Primary Care Provider.  If you are age 68 or younger, your body mass index should be between 19-25. Your Body mass index is 23.49 kg/m. If this is out of the aformentioned range listed, please consider follow up with your Primary Care Provider.   STATE Benefiber one dose daily in a glass of water  RE-START Glycopyrrolate 2 mg 1 tablet every morning  Call the office in 2-3 weeks, if your symptoms have not improved, ask to speak with Beth.  Follow up as needed for now.

## 2020-04-14 NOTE — Progress Notes (Signed)
Reviewed and agree with management plan.  Shanik Brookshire T. Loretta Doutt, MD FACG [facility]  

## 2020-04-21 ENCOUNTER — Encounter: Payer: Self-pay | Admitting: Internal Medicine

## 2020-04-21 DIAGNOSIS — Z8546 Personal history of malignant neoplasm of prostate: Secondary | ICD-10-CM

## 2020-04-23 DIAGNOSIS — M545 Low back pain, unspecified: Secondary | ICD-10-CM | POA: Diagnosis not present

## 2020-05-02 ENCOUNTER — Other Ambulatory Visit (INDEPENDENT_AMBULATORY_CARE_PROVIDER_SITE_OTHER): Payer: PPO

## 2020-05-02 DIAGNOSIS — E038 Other specified hypothyroidism: Secondary | ICD-10-CM

## 2020-05-02 LAB — TSH: TSH: 1.64 u[IU]/mL (ref 0.35–4.50)

## 2020-05-05 ENCOUNTER — Telehealth: Payer: Self-pay | Admitting: Physician Assistant

## 2020-05-05 NOTE — Telephone Encounter (Signed)
Pt is requesting a call back from a nurse to discuss his bowel problems, pt did not disclose any further information.

## 2020-05-05 NOTE — Telephone Encounter (Signed)
Patient calling again to make sure his message was not lost in the shuffle

## 2020-05-05 NOTE — Telephone Encounter (Signed)
Patient is finding that he is having a little difficulty with emptying his bowels. Has good control with the Benefiber. Very good water intake. He has taken Miralax and Colace a few times a week and this sems to help. Discussed possible daily use of Miralax vs slight decrease in the benefiber. Discussed how to titrate to his needs.  Is there anything specific you want him to do to obtain daily easy bowel movements?

## 2020-05-06 DIAGNOSIS — H35342 Macular cyst, hole, or pseudohole, left eye: Secondary | ICD-10-CM | POA: Diagnosis not present

## 2020-05-06 DIAGNOSIS — H524 Presbyopia: Secondary | ICD-10-CM | POA: Diagnosis not present

## 2020-05-06 DIAGNOSIS — Z961 Presence of intraocular lens: Secondary | ICD-10-CM | POA: Diagnosis not present

## 2020-05-07 NOTE — Telephone Encounter (Signed)
Patient advised and agrees to this plan of care.

## 2020-05-07 NOTE — Telephone Encounter (Signed)
I would continue benefiber ,and use Miralax half to whole dose daily or every other day as needed.

## 2020-05-14 ENCOUNTER — Telehealth: Payer: Self-pay | Admitting: Internal Medicine

## 2020-05-14 NOTE — Progress Notes (Signed)
°  Chronic Care Management   Outreach Note  05/14/2020 Name: BUTCH OTTERSON MRN: [id number] DOB: [birth year]  Referred by: Binnie Rail, MD Reason for referral : No chief complaint on file.   An unsuccessful telephone outreach was attempted today. The patient was referred to the pharmacist for assistance with care management and care coordination.   Follow Up Plan:   Carley Perdue [employer] Scheduler

## 2020-05-15 ENCOUNTER — Encounter: Payer: Self-pay | Admitting: Internal Medicine

## 2020-05-19 ENCOUNTER — Telehealth: Payer: Self-pay | Admitting: Internal Medicine

## 2020-05-19 NOTE — Progress Notes (Signed)
°  Chronic Care Management   Outreach Note  05/19/2020 Name: Jeremy Waters MRN: [id number] DOB: 1946/02/16  Referred by: Binnie Rail, MD Reason for referral : No chief complaint on file.   A second unsuccessful telephone outreach was attempted today. The patient was referred to pharmacist for assistance with care management and care coordination.  Follow Up Plan:   Carley Perdue [employer] Scheduler

## 2020-05-26 ENCOUNTER — Telehealth: Payer: Self-pay | Admitting: Internal Medicine

## 2020-05-26 DIAGNOSIS — M25571 Pain in right ankle and joints of right foot: Secondary | ICD-10-CM | POA: Diagnosis not present

## 2020-05-26 DIAGNOSIS — M67961 Unspecified disorder of synovium and tendon, right lower leg: Secondary | ICD-10-CM | POA: Diagnosis not present

## 2020-05-26 NOTE — Progress Notes (Signed)
  Chronic Care Management   Note  05/26/2020 Name: OSHEN WLODARCZYK MRN: [id number] DOB: 01-Dec-1945  YOBANY VROOM is a 75 y.o. year old male who is a primary care patient of Burns, Bobette Mo, MD. I reached out to [name] by phone today in response to a referral sent by Mr. Maryella Shivers [place]'s PCP, Pincus Sanes, MD.   Mr. Renault was given information about Chronic Care Management services today including:  1. CCM service includes personalized support from designated clinical staff supervised by his physician, including individualized plan of care and coordination with other care providers 2. 24/7 contact phone numbers for assistance for urgent and routine care needs. 3. Service will only be billed when office clinical staff spend 20 minutes or more in a month to coordinate care. 4. Only one practitioner may furnish and bill the service in a calendar month. 5. The patient may stop CCM services at any time (effective at the end of the month) by phone call to the office staff.   Patient agreed to services and verbal consent obtained.   Follow up plan:   Carley Perdue [employer] Scheduler

## 2020-05-26 NOTE — Telephone Encounter (Signed)
Patient calling to speak with someone about making a appointment with the pharmacist, [place] first two times. Patient has tried to leave VM as well  [phone]

## 2020-05-29 DIAGNOSIS — Z20822 Contact with and (suspected) exposure to covid-19: Secondary | ICD-10-CM | POA: Diagnosis not present

## 2020-06-04 ENCOUNTER — Other Ambulatory Visit: Payer: Self-pay | Admitting: Internal Medicine

## 2020-06-04 DIAGNOSIS — Z8546 Personal history of malignant neoplasm of prostate: Secondary | ICD-10-CM | POA: Diagnosis not present

## 2020-06-04 DIAGNOSIS — N5201 Erectile dysfunction due to arterial insufficiency: Secondary | ICD-10-CM | POA: Diagnosis not present

## 2020-06-04 DIAGNOSIS — N393 Stress incontinence (female) (male): Secondary | ICD-10-CM | POA: Diagnosis not present

## 2020-06-06 ENCOUNTER — Other Ambulatory Visit: Payer: Self-pay

## 2020-06-06 ENCOUNTER — Encounter: Payer: Self-pay | Admitting: Internal Medicine

## 2020-06-06 DIAGNOSIS — M545 Low back pain, unspecified: Secondary | ICD-10-CM | POA: Diagnosis not present

## 2020-06-06 MED ORDER — FLUTICASONE PROPIONATE 50 MCG/ACT NA SUSP
NASAL | 1 refills | Status: DC
Start: 2020-06-06 — End: 2021-07-09

## 2020-06-11 DIAGNOSIS — M6289 Other specified disorders of muscle: Secondary | ICD-10-CM | POA: Diagnosis not present

## 2020-06-11 DIAGNOSIS — M62838 Other muscle spasm: Secondary | ICD-10-CM | POA: Diagnosis not present

## 2020-06-11 DIAGNOSIS — N393 Stress incontinence (female) (male): Secondary | ICD-10-CM | POA: Diagnosis not present

## 2020-06-11 DIAGNOSIS — M6281 Muscle weakness (generalized): Secondary | ICD-10-CM | POA: Diagnosis not present

## 2020-06-23 DIAGNOSIS — M545 Low back pain, unspecified: Secondary | ICD-10-CM | POA: Diagnosis not present

## 2020-06-25 DIAGNOSIS — N393 Stress incontinence (female) (male): Secondary | ICD-10-CM | POA: Diagnosis not present

## 2020-06-25 DIAGNOSIS — M6281 Muscle weakness (generalized): Secondary | ICD-10-CM | POA: Diagnosis not present

## 2020-06-25 DIAGNOSIS — M6289 Other specified disorders of muscle: Secondary | ICD-10-CM | POA: Diagnosis not present

## 2020-06-25 DIAGNOSIS — M62838 Other muscle spasm: Secondary | ICD-10-CM | POA: Diagnosis not present

## 2020-07-14 ENCOUNTER — Telehealth: Payer: Self-pay | Admitting: Pharmacist

## 2020-07-14 NOTE — Progress Notes (Signed)
Chronic Care Management Pharmacy Assistant   Name: Jeremy Waters  MRN: [id number] DOB: [birth year]  Reason for Encounter: Initial Questions   PCP : Binnie Rail, MD  Allergies:   Allergies  Allergen Reactions  . Atorvastatin Other (See Comments)    Increased LFTs    Medications: Outpatient Encounter Medications as of 07/14/2020  Medication Sig Note  . beta carotene 25000 UNIT capsule Take 25,000 Units by mouth daily.   . clonazePAM (KLONOPIN) 0.5 MG tablet Take 1 tablet (0.5 mg total) by mouth at bedtime as needed.   . fluticasone (FLONASE) 50 MCG/ACT nasal spray Instill one spray into both nostrils daily   . glycopyrrolate (ROBINUL) 2 MG tablet Take 1 tablet (2 mg total) by mouth in the morning.   Marland Kitchen levothyroxine (SYNTHROID) 50 MCG tablet Take 1 tablet (50 mcg total) by mouth daily.   Marland Kitchen pyridOXINE (VITAMIN B-6) 100 MG tablet Take 100 mg by mouth daily.   . sildenafil (REVATIO) 20 MG tablet Take 2-5 tablets daily as needed for erectile dysfunction 12/26/2015: Received from: [facility]  . simvastatin (ZOCOR) 40 MG tablet Take 1 tablet (40 mg total) by mouth at bedtime.   . vitamin C (ASCORBIC ACID) 500 MG tablet Take 500 mg by mouth 2 (two) times daily.    . vitamin E 400 UNIT capsule Take 400 Units by mouth daily.    No facility-administered encounter medications on file as of 07/14/2020.    Current Diagnosis: Patient Active Problem List   Diagnosis Date Noted  . LUQ pain 01/16/2019  . Right cervical radiculopathy 03/03/2017  . Carotid artery disease ([place]) 09/15/2016  . Spinal stenosis, lumbar 10/08/2015  . Sensorineural hearing loss of both ears 04/10/2014  . Organic impotence 04/10/2014  . Hyperglycemia 04/02/2014  . S/P left TKA 07/03/2013  . Left knee DJD 06/07/2013  . Hypothyroidism 03/03/2012  . Axillary mass, right 04/07/2011  . ROSACEA 03/19/2010  . ARTHRALGIA 03/19/2010  . CERVICALGIA 09/05/2009  . CARPAL TUNNEL SYNDROME 06/17/2008  . DEGENERATIVE  JOINT DISEASE, GENERALIZED 06/17/2008  . CERVICAL RADICULOPATHY, LEFT 06/17/2008  . Disturbance in sleep behavior 03/04/2008  . PROSTATE CANCER, HX OF 03/04/2008  . Hyperlipidemia 06/15/2007  . DIVERTICULOSIS, COLON W/O HEM 03/03/2007    Goals Addressed   None     Follow-Up:  Pharmacist Review    Have you seen any other providers since your last visit? The patient states that he last saw a provider at [facility]  Any changes in your medications or health? The patient stated that he has not had any changes to his medications or health that are new  Any side effects from any medications? The patient states that he has not had any side effects from medications  Do you have an symptoms or problems not managed by your medications? The patient states that he does not have any symptoms or problems not managed by medications  Any concerns about your health right now? The patient states that he does not have any concerns about his heath at this time  Has your provider asked that you check blood pressure, blood sugar, or follow special diet at home? The patient does not check blood pressure at home, not diabetic, and is not on any special diet  Do you get any type of exercise on a regular basis? The patient states that he works out at [employer]  Can you think of a goal you would like to reach for your health? The  patient states that he would like to stay as healthy as possible  Do you have any problems getting your medications? The patient states that he does not have any problems with getting his medications from the pharmacy  Is there anything that you would like to discuss during the appointment? The patient states he does not have anything specific to discuss at this time  Patient would like to come to the office for visit, asked to  bring medications and supplements to appointment   Wendy Poet, [place] [phone]   Time  spent:30

## 2020-07-16 DIAGNOSIS — M545 Low back pain, unspecified: Secondary | ICD-10-CM | POA: Diagnosis not present

## 2020-07-17 ENCOUNTER — Ambulatory Visit (INDEPENDENT_AMBULATORY_CARE_PROVIDER_SITE_OTHER): Payer: PPO | Admitting: Pharmacist

## 2020-07-17 ENCOUNTER — Other Ambulatory Visit: Payer: Self-pay

## 2020-07-17 DIAGNOSIS — K582 Mixed irritable bowel syndrome: Secondary | ICD-10-CM

## 2020-07-17 DIAGNOSIS — E039 Hypothyroidism, unspecified: Secondary | ICD-10-CM

## 2020-07-17 DIAGNOSIS — E782 Mixed hyperlipidemia: Secondary | ICD-10-CM | POA: Diagnosis not present

## 2020-07-17 DIAGNOSIS — I6523 Occlusion and stenosis of bilateral carotid arteries: Secondary | ICD-10-CM

## 2020-07-17 NOTE — Patient Instructions (Addendum)
Visit Information  Phone number for Pharmacist: [phone]  Thank you for meeting with me to discuss your medications! I look forward to working with you to achieve your health care goals. Below is a summary of what we talked about during the visit:  Goals Addressed            This Visit's Progress   . Manage My Medicine       Timeframe:  Long-Range Goal Priority:  Medium Start Date:     07/17/20                        Expected End Date:  07/17/21                      Follow Up Date 03/23/21    - call for medicine refill 2 or 3 days before it runs out - call if I am sick and can't take my medicine - keep a list of all the medicines I take; vitamins and herbals too - use a pillbox to sort medicine    Why is this important?   . These steps will help you keep on track with your medicines.   Notes:       Patient Care Plan: CCM Pharmacy Care Plan    Problem Identified: Hyperlipidemia, Carotid artery disease, Hypothyroidism and IBS   Priority: High    Long-Range Goal: Disease management   Start Date: 07/17/2020  Expected End Date: 07/17/2021  This Visit's Progress: On track  Priority: High  Note:   Current Barriers:  . Unable to independently monitor therapeutic efficacy  Pharmacist Clinical Goal(s):  Marland Kitchen Over the next 365 days, patient will achieve adherence to monitoring guidelines and medication adherence to achieve therapeutic efficacy through collaboration with PharmD and provider.   Interventions: . 1:1 collaboration with Binnie Rail, MD regarding development and update of comprehensive plan of care as evidenced by provider attestation and co-signature . Inter-disciplinary care team collaboration (see longitudinal plan of care) . Comprehensive medication review performed; medication list updated in electronic medical record  Hyperlipidemia / carotid artery disease: (LDL goal < 70) -Not ideally controlled - most recent LDL was 115, up from 84 the year prior, but  may have been falsely high due to elevated TSH. -Current treatment: . Simvastatin 40 mg daily HS -Medications previously tried: atorvastatin (elevated LFTs)  -Current exercise habits: uses home gym daily, goes 4 miles on elliptical -Educated on Cholesterol goals;  Benefits of statin for ASCVD risk reduction; Exercise goal of 150 minutes per week; -Recommended to continue current medication - if LDL is above goal at next check may consider changing to higher intensity statin (rosuvastatin 10 mg)  Insomnia (Goal: manage symptoms) -Controlled -Current treatment: . Clonazepam 0.5 mg HS prn - uses < once a month -Medications previously tried/failed: none -PHQ9: 0 -GAD7: not on file -Educated on importance of using clonazepam sparingly - pt is unlikely to have side effects or dependency issues with how seldom he uses it now -Recommended to continue current medication  Hypothyroidism (Goal: maintain TSH in goal range) -Controlled - TSH was in goal range after most recent dose escalation. Pt takes medication 1 hour before other meds in the morning -Current treatment  . Levothyroxine 50 mcg daily -Recommended to continue current medication Educated on symptoms and long term issues associated with hypothyroidism  IBS-alternating (Goal: manage symptoms) -Controlled - pt reports stomach issues have improved since starting this regimen; denies anticholinergic  side effects -Current treatment  . Glycopyrrolate 2 mg daily . Benefiber daily -Recommended to continue current medication Educated on anticholinergic side effects - pt currently only endorses dry skin in the winter  Health Maintenance -Vaccine gaps: covid booster (02/29/2020) -Current therapy:  . Beta carotene 25,000 units daily . Vitamin B6 100 mg daily . Vitamin C 500 mg BID . Vitamin E 400 unit daily . Fluticasone nasal spray PRN . Sildenafil 20 mg 2-5 tab PRN ED -Patient is satisfied with current therapy and denies  issues -Recommended to continue current medication  Patient Goals/Self-Care Activities . Over the next 365 days, patient will:  - take medications as prescribed focus on medication adherence by pill box target a minimum of 150 minutes of moderate intensity exercise weekly  Follow Up Plan: Telephone follow up appointment with care management team member scheduled for: 1 year      Jeremy Waters was given information about Chronic Care Management services today including:  1. CCM service includes personalized support from designated clinical staff supervised by his physician, including individualized plan of care and coordination with other care providers 2. 24/7 contact phone numbers for assistance for urgent and routine care needs. 3. Standard insurance, coinsurance, copays and deductibles apply for chronic care management only during months in which we provide at least 20 minutes of these services. Most insurances cover these services at 100%, however patients may be responsible for any copay, coinsurance and/or deductible if applicable. This service may help you avoid the need for more expensive face-to-face services. 4. Only one practitioner may furnish and bill the service in a calendar month. 5. The patient may stop CCM services at any time (effective at the end of the month) by phone call to the office staff.  Patient agreed to services and verbal consent obtained.   The patient verbalized understanding of instructions, educational materials, and care plan provided today and agreed to receive a mailed copy of patient instructions, educational materials, and care plan.  Telephone follow up appointment with pharmacy team member scheduled for: 1 year  Charlene Brooke, PharmD, BCACP Clinical Pharmacist [facility] [phone]  Cholesterol Content in Foods Cholesterol is a waxy, fat-like substance that helps to carry fat in the blood. The body needs cholesterol  in small amounts, but too much cholesterol can cause damage to the arteries and heart. Most people should eat less than 200 milligrams (mg) of cholesterol a day. Foods with cholesterol Cholesterol is found in animal-based foods, such as meat, seafood, and dairy. Generally, low-fat dairy and lean meats have less cholesterol than full-fat dairy and fatty meats. The milligrams of cholesterol per serving (mg per serving) of common cholesterol-containing foods are listed below. Meat and other proteins  Egg -- one large whole egg has 186 mg.  Veal shank -- 4 oz has 141 mg.  Lean ground [place] (93% lean) -- 4 oz has 118 mg.  Fat-trimmed lamb loin -- 4 oz has 106 mg.  Lean ground beef (90% lean) -- 4 oz has 100 mg.  Lobster -- 3.5 oz has 90 mg.  Pork loin chops -- 4 oz has 86 mg.  Canned salmon -- 3.5 oz has 83 mg.  Fat-trimmed beef top loin -- 4 oz has 78 mg.  Frankfurter -- 1 frank (3.5 oz) has 77 mg.  Crab -- 3.5 oz has 71 mg.  Roasted chicken without skin, white meat -- 4 oz has 66 mg.  Light bologna -- 2 oz has 45  mg.  Deli-cut [place] -- 2 oz has 31 mg.  Canned tuna -- 3.5 oz has 31 mg.  Berniece Salines -- 1 oz has 29 mg.  Oysters and mussels (raw) -- 3.5 oz has 25 mg.  Mackerel -- 1 oz has 22 mg.  Trout -- 1 oz has 20 mg.  Pork sausage -- 1 link (1 oz) has 17 mg.  Salmon -- 1 oz has 16 mg.  Tilapia -- 1 oz has 14 mg. Dairy  Soft-serve ice cream --  cup (4 oz) has 103 mg.  Whole-milk yogurt -- 1 cup (8 oz) has 29 mg.  Cheddar cheese -- 1 oz has 28 mg.  American cheese -- 1 oz has 28 mg.  Whole milk -- 1 cup (8 oz) has 23 mg.  2% milk -- 1 cup (8 oz) has 18 mg.  Cream cheese -- 1 tablespoon (Tbsp) has 15 mg.  Cottage cheese --  cup (4 oz) has 14 mg.  Low-fat (1%) milk -- 1 cup (8 oz) has 10 mg.  Sour cream -- 1 Tbsp has 8.5 mg.  Low-fat yogurt -- 1 cup (8 oz) has 8 mg.  Nonfat Greek yogurt -- 1 cup (8 oz) has 7 mg.  Half-and-half cream -- 1 Tbsp has 5  mg. Fats and oils  Cod liver oil -- 1 tablespoon (Tbsp) has 82 mg.  Butter -- 1 Tbsp has 15 mg.  Lard -- 1 Tbsp has 14 mg.  Bacon grease -- 1 Tbsp has 14 mg.  Mayonnaise -- 1 Tbsp has 5-10 mg.  Margarine -- 1 Tbsp has 3-10 mg. Exact amounts of cholesterol in these foods may vary depending on specific ingredients and brands.   Foods without cholesterol Most plant-based foods do not have cholesterol unless you combine them with a food that has cholesterol. Foods without cholesterol include:  Grains and cereals.  Vegetables.  Fruits.  Vegetable oils, such as olive, canola, and sunflower oil.  Legumes, such as peas, beans, and lentils.  Nuts and seeds.  Egg whites.   Summary  The body needs cholesterol in small amounts, but too much cholesterol can cause damage to the arteries and heart.  Most people should eat less than 200 milligrams (mg) of cholesterol a day. This information is not intended to replace advice given to you by your health care provider. Make sure you discuss any questions you have with your health care provider. Document Revised: 10/01/2019 Document Reviewed: 10/01/2019 Elsevier Patient Education  [place].

## 2020-07-17 NOTE — Progress Notes (Signed)
Chronic Care Management Pharmacy Note  07/17/2020 Name:  Jeremy Waters MRN:  [id number] DOB:  July 31, 1945  Subjective: Jeremy Waters is an 75 y.o. year old male who is a primary patient of Burns, Claudina Lick, MD.  The CCM team was consulted for assistance with disease management and care coordination needs.    Engaged with patient face to face for initial visit in response to provider referral for pharmacy case management and/or care coordination services.   Consent to Services:  The patient was given the following information about Chronic Care Management services today, agreed to services, and gave verbal consent: 1. CCM service includes personalized support from designated clinical staff supervised by the primary care provider, including individualized plan of care and coordination with other care providers 2. 24/7 contact phone numbers for assistance for urgent and routine care needs. 3. Service will only be billed when office clinical staff spend 20 minutes or more in a month to coordinate care. 4. Only one practitioner may furnish and bill the service in a calendar month. 5.The patient may stop CCM services at any time (effective at the end of the month) by phone call to the office staff. 6. The patient will be responsible for cost sharing (co-pay) of up to 20% of the service fee (after annual deductible is met). Patient agreed to services and consent obtained.  Patient Care Team: Binnie Rail, MD as PCP - General (Internal Medicine) Newt Lukes, AUD (Audiology) Ladene Artist, MD as Consulting Physician (Gastroenterology) Charlton Haws, [facility] as Pharmacist (Pharmacist)  Recent office visits: 02/11/20 Dr burns OV: chronic f/u. Elevated TSH - increase levothyroxine to 50 mcg. LDL also elevated (may be d/t TSH), continue same simvastatin.  Recent consult visits: 07/07/20 [facility] audiology: picked up repaired hearing aids.  06/04/20 Dr Junious Silk (urology): f/u prostate Ca s/p RALP  2006, ED. Rx'd sildenafil 20 mg PRN ED. Referred to PT for incontinence.  04/11/20 PA Amy Esterwood (GI): f/u IBS, more frequent BM and constipation. Start Benefiber, restart glycopyrrolate 2 mg. Plan colonoscopy may 2022.  Hospital visits: None in previous 6 months  Objective:  Lab Results  Component Value Date   CREATININE 0.90 01/30/2020   BUN 18 01/30/2020   GFR 82.73 01/30/2020   GFRNONAA >60 10/09/2015   GFRAA >60 10/09/2015   NA 141 01/30/2020   K 4.3 01/30/2020   CALCIUM 9.7 01/30/2020   CO2 26 01/30/2020    Lab Results  Component Value Date/Time   HGBA1C 5.4 01/30/2020 08:01 AM   HGBA1C 5.5 11/30/2017 09:16 AM   GFR 82.73 01/30/2020 08:01 AM   GFR 95.05 01/16/2019 03:48 PM    Last diabetic Eye exam: No results found for: HMDIABEYEEXA  Last diabetic Foot exam: No results found for: HMDIABFOOTEX   Lab Results  Component Value Date   CHOL 212 (H) 01/30/2020   HDL 78.30 01/30/2020   LDLCALC 115 (H) 01/30/2020   LDLDIRECT 179.6 01/04/2013   TRIG 92.0 01/30/2020   CHOLHDL 3 01/30/2020    Hepatic Function Latest Ref Rng & Units 01/30/2020 01/16/2019 11/08/2018  Total Protein 6.0 - 8.3 g/dL 7.8 7.5 7.2  Albumin 3.5 - 5.2 g/dL 4.7 4.7 4.4  AST 0 - 37 U/L 30 27 26   ALT 0 - 53 U/L 21 19 19   Alk Phosphatase 39 - 117 U/L 57 51 56  Total Bilirubin 0.2 - 1.2 mg/dL 0.8 0.7 0.5  Bilirubin, Direct 0.0 - 0.3 mg/dL - - -    Lab  Results  Component Value Date/Time   TSH 1.64 05/02/2020 08:44 AM   TSH 6.77 (H) 01/30/2020 08:01 AM   FREET4 0.7 06/17/2008 12:00 AM    CBC Latest Ref Rng & Units 01/30/2020 01/16/2019 11/30/2017  WBC 4.0 - 10.5 K/uL 8.9 6.9 7.7  Hemoglobin 13.0 - 17.0 g/dL 14.9 14.2 14.2  Hematocrit 39.0 - 52.0 % 43.6 41.7 41.2  Platelets 150.0 - 400.0 K/uL 264.0 248.0 249.0    No results found for: VD25OH  Clinical ASCVD: Yes - carotid artery disease The 10-year ASCVD risk score Mikey Bussing DC Jr., et al., 2013) is: 15.9%   Values used to calculate the score:      Age: 28 years     Sex: Male     Is Non-Hispanic African American: No     Diabetic: No     Tobacco smoker: No     Systolic Blood Pressure: 003 mmHg     Is BP treated: No     HDL Cholesterol: 78.3 mg/dL     Total Cholesterol: 212 mg/dL    Depression screen [facility] 2/9 02/11/2020 12/01/2018 10/11/2017  Decreased Interest 0 0 0  Down, Depressed, Hopeless 0 0 0  PHQ - 2 Score 0 0 0    Social History   Tobacco Use  Smoking Status Former Smoker  Smokeless Tobacco Never Used  Tobacco Comment   stopped smoking cigarettes 1983, 1 cigar /day 2001-2013   BP Readings from Last 3 Encounters:  04/11/20 114/62  02/11/20 130/68  02/13/19 120/70   Pulse Readings from Last 3 Encounters:  04/11/20 72  02/11/20 79  02/13/19 60   Wt Readings from Last 3 Encounters:  04/11/20 150 lb (68 kg)  02/11/20 152 lb 3.2 oz (69 kg)  02/13/19 154 lb (69.9 kg)    Assessment/Interventions: Review of patient past medical history, allergies, medications, health status, including review of consultants reports, laboratory and other test data, was performed as part of comprehensive evaluation and provision of chronic care management services.   SDOH:  (Social Determinants of Health) assessments and interventions performed: Yes SDOH Interventions   Flowsheet Row Most Recent Value  SDOH Interventions   Financial Strain Interventions Intervention Not Indicated      CCM Care Plan  Allergies  Allergen Reactions  . Atorvastatin Other (See Comments)    Increased LFTs    Medications Reviewed Today    Reviewed by Charlton Haws, [facility] (Pharmacist) on 07/17/20 at [phone]  Med List Status: <None>  Medication Order Taking? Sig Documenting Provider Last Dose Status Informant  beta carotene 25000 UNIT capsule [id number] Yes Take 25,000 Units by mouth daily. [provider] Taking Active   clonazePAM (KLONOPIN) 0.5 MG tablet [id number] Yes Take 1 tablet (0.5 mg total) by mouth at bedtime as needed. Binnie Rail, MD Taking Active            Med Note Rolan Bucco Jul 17, 2020  9:36 AM) Dewaine Conger < once a month  fluticasone (FLONASE) 50 MCG/ACT nasal spray [id number] Yes Instill one spray into both nostrils daily Burns, Claudina Lick, MD Taking Active   glycopyrrolate (ROBINUL) 2 MG tablet [id number] Yes Take 1 tablet (2 mg total) by mouth in the morning. Esterwood, Amy S, PA-C Taking Active   ibuprofen (ADVIL) 200 MG tablet [id number] Yes Take 200 mg by mouth every 6 (six) hours as needed. [provider] Taking Active   levothyroxine (SYNTHROID) 50 MCG tablet [id number] Yes Take 1 tablet (50  mcg total) by mouth daily. Binnie Rail, MD Taking Active   pyridOXINE (VITAMIN B-6) 100 MG tablet [id number] Yes Take 100 mg by mouth daily. [provider] Taking Active Self  sildenafil (REVATIO) 20 MG tablet [id number] Yes Take 2-5 tablets daily as needed for erectile dysfunction [provider] Taking Active            Med Note Doyle Askew [facility]   Fri Dec 26, 2015 11:03 AM) Received from: [facility]  simvastatin (ZOCOR) 40 MG tablet [id number] Yes Take 1 tablet (40 mg total) by mouth at bedtime. Binnie Rail, MD Taking Active   vitamin C (ASCORBIC ACID) 500 MG tablet [id number] Yes Take 500 mg by mouth 2 (two) times daily. [provider] Taking Active Self  vitamin E 400 UNIT capsule [id number] Yes Take 400 Units by mouth daily. [provider] Taking Active           Patient Active Problem List   Diagnosis Date Noted  . LUQ pain 01/16/2019  . Right cervical radiculopathy 03/03/2017  . Carotid artery disease ([place]) 09/15/2016  . Spinal stenosis, lumbar 10/08/2015  . Sensorineural hearing loss of both ears 04/10/2014  . Organic impotence 04/10/2014  . Hyperglycemia 04/02/2014  . S/P left TKA 07/03/2013  . Left knee DJD 06/07/2013  . Hypothyroidism 03/03/2012  . Axillary mass, right 04/07/2011  . ROSACEA 03/19/2010  . ARTHRALGIA 03/19/2010  .  CERVICALGIA 09/05/2009  . CARPAL TUNNEL SYNDROME 06/17/2008  . DEGENERATIVE JOINT DISEASE, GENERALIZED 06/17/2008  . CERVICAL RADICULOPATHY, LEFT 06/17/2008  . Disturbance in sleep behavior 03/04/2008  . PROSTATE CANCER, HX OF 03/04/2008  . Hyperlipidemia 06/15/2007  . DIVERTICULOSIS, COLON W/O HEM 03/03/2007    Immunization History  Administered Date(s) Administered  . Fluad Quad(high Dose 65+) 01/17/2019, 02/11/2020  . Hepatitis A 10/15/1997, 12/04/1997  . Hepatitis B 09/09/2008, 10/15/2008, 08/01/2017  . Influenza Split 03/16/2011, 03/03/2012  . Influenza, High Dose Seasonal PF 02/09/2016, 03/03/2017, 02/23/2018  . Influenza,inj,Quad PF,6+ Mos 03/08/2014, 02/27/2015  . Influenza-Unspecified 03/07/2013  . Meningococcal Conjugate 09/09/2008  . PFIZER(Purple Top)SARS-COV-2 Vaccination 06/18/2019, 07/09/2019, 02/29/2020  . Pneumococcal Conjugate-13 11/20/2014  . Pneumococcal Polysaccharide-23 06/07/2013  . Tdap 05/24/2008, 08/01/2017  . Typhoid Inactivated 09/04/1997, 07/30/2004, 08/01/2017  . Yellow Fever 09/09/2008  . Zoster 03/18/2011  . Zoster Recombinat (Shingrix) 09/21/2016, 03/03/2017    Conditions to be addressed/monitored:  Hyperlipidemia, Hypothyroidism and IBS, Carotid artery disease  Care Plan : [place]  Updates made by Charlton Haws, [place] since 07/17/2020 12:00 AM    Problem: Hyperlipidemia, Carotid artery disease, Hypothyroidism and IBS   Priority: High    Long-Range Goal: Disease management   Start Date: 07/17/2020  Expected End Date: 07/17/2021  This Visit's Progress: On track  Priority: High  Note:   Current Barriers:  . Unable to independently monitor therapeutic efficacy  Pharmacist Clinical Goal(s):  Marland Kitchen Over the next 365 days, patient will achieve adherence to monitoring guidelines and medication adherence to achieve therapeutic efficacy through collaboration with PharmD and provider.   Interventions: . 1:1 collaboration with  Binnie Rail, MD regarding development and update of comprehensive plan of care as evidenced by provider attestation and co-signature . Inter-disciplinary care team collaboration (see longitudinal plan of care) . Comprehensive medication review performed; medication list updated in electronic medical record  Hyperlipidemia / carotid artery disease: (LDL goal < 70) -Not ideally controlled - most recent LDL was 115, up from 84 the year prior, but may have  been falsely high due to elevated TSH. -Current treatment: . Simvastatin 40 mg daily HS -Medications previously tried: atorvastatin (elevated LFTs)  -Current exercise habits: uses home gym daily, goes 4 miles on elliptical -Educated on Cholesterol goals;  Benefits of statin for ASCVD risk reduction; Exercise goal of 150 minutes per week; -Recommended to continue current medication - if LDL is above goal at next check may consider changing to higher intensity statin (rosuvastatin 10 mg)  Insomnia (Goal: manage symptoms) -Controlled -Current treatment: . Clonazepam 0.5 mg HS prn - uses < once a month -Medications previously tried/failed: none -PHQ9: 0 -GAD7: not on file -Educated on importance of using clonazepam sparingly - pt is unlikely to have side effects or dependency issues with how seldom he uses it now -Recommended to continue current medication  Hypothyroidism (Goal: maintain TSH in goal range) -Controlled - TSH was in goal range after most recent dose escalation. Pt takes medication 1 hour before other meds in the morning -Current treatment  . Levothyroxine 50 mcg daily -Recommended to continue current medication Educated on symptoms and long term issues associated with hypothyroidism  IBS-alternating (Goal: manage symptoms) -Controlled - pt reports stomach issues have improved since starting this regimen; denies anticholinergic side effects -Current treatment  . Glycopyrrolate 2 mg daily . Benefiber daily -Recommended  to continue current medication Educated on anticholinergic side effects - pt currently only endorses dry skin in the winter  Health Maintenance -Vaccine gaps: covid booster (02/29/2020) -Current therapy:  . Beta carotene 25,000 units daily . Vitamin B6 100 mg daily . Vitamin C 500 mg BID . Vitamin E 400 unit daily . Fluticasone nasal spray PRN . Sildenafil 20 mg 2-5 tab PRN ED -Patient is satisfied with current therapy and denies issues -Recommended to continue current medication  Patient Goals/Self-Care Activities . Over the next 365 days, patient will:  - take medications as prescribed focus on medication adherence by pill box target a minimum of 150 minutes of moderate intensity exercise weekly  Follow Up Plan: Telephone follow up appointment with care management team member scheduled for: 1 year      Medication Assistance: None required.  Patient affirms current coverage meets needs.  Patient's preferred pharmacy is:  [facility] [address] [place] [address] Phone: [phone] Fax: [facility], [place] [address] Phone: [phone] Fax: [phone]  Uses pill box? Yes Pt endorses 100% compliance  We discussed: Current pharmacy is preferred with insurance plan and patient is satisfied with pharmacy services Patient decided to: Continue current medication management strategy  Care Plan and Follow Up Patient Decision:  Patient agrees to Care Plan and Follow-up.  Plan: Telephone follow up appointment with care management team member scheduled for:  1 year  Charlene Brooke, PharmD, [facility] Clinical Pharmacist [facility] [phone]

## 2020-08-04 DIAGNOSIS — M545 Low back pain, unspecified: Secondary | ICD-10-CM | POA: Diagnosis not present

## 2020-08-18 DIAGNOSIS — N5201 Erectile dysfunction due to arterial insufficiency: Secondary | ICD-10-CM | POA: Diagnosis not present

## 2020-08-18 DIAGNOSIS — M545 Low back pain, unspecified: Secondary | ICD-10-CM | POA: Diagnosis not present

## 2020-09-01 DIAGNOSIS — M545 Low back pain, unspecified: Secondary | ICD-10-CM | POA: Diagnosis not present

## 2020-09-03 DIAGNOSIS — N5201 Erectile dysfunction due to arterial insufficiency: Secondary | ICD-10-CM | POA: Diagnosis not present

## 2020-09-22 DIAGNOSIS — M545 Low back pain, unspecified: Secondary | ICD-10-CM | POA: Diagnosis not present

## 2020-10-07 ENCOUNTER — Telehealth: Payer: Self-pay | Admitting: Pharmacist

## 2020-10-07 NOTE — Progress Notes (Signed)
    Chronic Care Management Pharmacy Assistant   Name: Jeremy Waters  MRN: [id number] DOB: 12-27-1945   Reason for Encounter: Disease State - General Adherence    Recent office visits:  None noted.   Recent consult visits:  08/18/20 [place] (Urology) - Erectile Dysfunction.   08/04/20 [place] (Physical Therapy) - Low back pain.   Hospital visits:  None in previous 6 months  Medications: Outpatient Encounter Medications as of 10/07/2020  Medication Sig Note  . beta carotene 25000 UNIT capsule Take 25,000 Units by mouth daily.   . clonazePAM (KLONOPIN) 0.5 MG tablet Take 1 tablet (0.5 mg total) by mouth at bedtime as needed. 07/17/2020: Takes < once a month  . fluticasone (FLONASE) 50 MCG/ACT nasal spray Instill one spray into both nostrils daily   . glycopyrrolate (ROBINUL) 2 MG tablet Take 1 tablet (2 mg total) by mouth in the morning.   Marland Kitchen ibuprofen (ADVIL) 200 MG tablet Take 200 mg by mouth every 6 (six) hours as needed.   Marland Kitchen levothyroxine (SYNTHROID) 50 MCG tablet Take 1 tablet (50 mcg total) by mouth daily.   Marland Kitchen pyridOXINE (VITAMIN B-6) 100 MG tablet Take 100 mg by mouth daily.   . sildenafil (REVATIO) 20 MG tablet Take 2-5 tablets daily as needed for erectile dysfunction 12/26/2015: [facility]ceived from: [facility]  . simvastatin (ZOCOR) 40 MG tablet Take 1 tablet (40 mg total) by mouth at bedtime.   . vitamin C (ASCORBIC ACID) 500 MG tablet Take 500 mg by mouth 2 (two) times daily.   . vitamin E 400 UNIT capsule Take 400 Units by mouth daily.    No facility-administered encounter medications on file as of 10/07/2020.    Have you had any problems recently with your health? Patient states no problems at this time.  Have you had any problems with your pharmacy? Patient states no problems with current pharmacy.  What issues or side effects are you having with your medications? Patient states no side effects at this time.   What would you like me to pass along to [facility] for them to help you with?  Patient states nothing at this time.  What can we do to take care of you better? Patient states nothing at this time.   Star Rating Drugs: Simvastatin - last fill 09/09/20 [place], RMA Clinical Pharmacists Assistant [phone]  Time Spent: 64

## 2020-10-13 DIAGNOSIS — M545 Low back pain, unspecified: Secondary | ICD-10-CM | POA: Diagnosis not present

## 2020-11-03 ENCOUNTER — Other Ambulatory Visit: Payer: Self-pay

## 2020-11-03 MED ORDER — GLYCOPYRROLATE 2 MG PO TABS
2.0000 mg | ORAL_TABLET | Freq: Every morning | ORAL | 1 refills | Status: DC
Start: 2020-11-03 — End: 2021-06-18

## 2020-11-05 DIAGNOSIS — M545 Low back pain, unspecified: Secondary | ICD-10-CM | POA: Diagnosis not present

## 2020-11-13 DIAGNOSIS — L720 Epidermal cyst: Secondary | ICD-10-CM | POA: Diagnosis not present

## 2020-11-13 DIAGNOSIS — Z85828 Personal history of other malignant neoplasm of skin: Secondary | ICD-10-CM | POA: Diagnosis not present

## 2020-11-13 DIAGNOSIS — L821 Other seborrheic keratosis: Secondary | ICD-10-CM | POA: Diagnosis not present

## 2020-11-13 DIAGNOSIS — D225 Melanocytic nevi of trunk: Secondary | ICD-10-CM | POA: Diagnosis not present

## 2020-11-13 DIAGNOSIS — L738 Other specified follicular disorders: Secondary | ICD-10-CM | POA: Diagnosis not present

## 2020-11-19 ENCOUNTER — Other Ambulatory Visit: Payer: Self-pay

## 2020-11-19 MED ORDER — LEVOTHYROXINE SODIUM 50 MCG PO TABS
50.0000 ug | ORAL_TABLET | Freq: Every day | ORAL | 3 refills | Status: DC
Start: 2020-11-19 — End: 2022-02-03

## 2020-11-19 MED ORDER — SIMVASTATIN 40 MG PO TABS
40.0000 mg | ORAL_TABLET | Freq: Every day | ORAL | 3 refills | Status: DC
Start: 2020-11-19 — End: 2022-02-16

## 2020-12-01 DIAGNOSIS — H524 Presbyopia: Secondary | ICD-10-CM | POA: Diagnosis not present

## 2020-12-01 DIAGNOSIS — H35342 Macular cyst, hole, or pseudohole, left eye: Secondary | ICD-10-CM | POA: Diagnosis not present

## 2020-12-01 DIAGNOSIS — H4323 Crystalline deposits in vitreous body, bilateral: Secondary | ICD-10-CM | POA: Diagnosis not present

## 2020-12-01 DIAGNOSIS — Z961 Presence of intraocular lens: Secondary | ICD-10-CM | POA: Diagnosis not present

## 2020-12-10 DIAGNOSIS — M545 Low back pain, unspecified: Secondary | ICD-10-CM | POA: Diagnosis not present

## 2020-12-29 DIAGNOSIS — M545 Low back pain, unspecified: Secondary | ICD-10-CM | POA: Diagnosis not present

## 2021-01-06 ENCOUNTER — Telehealth: Payer: Self-pay | Admitting: Pharmacist

## 2021-01-06 NOTE — Progress Notes (Addendum)
    Chronic Care Management Pharmacy Assistant   Name: Jeremy Waters  MRN: [id number] DOB: 1945/09/02   Reason for Encounter: Disease State   Conditions to be addressed/monitored: General   Recent office visits:  None ID  Recent consult visits:  11/13/20 Dermatology 11/05/20 Physical Therapy 10/13/20 Physical Therapy  Hospital visits:  None in previous 6 months  Medications: Outpatient Encounter Medications as of 01/06/2021  Medication Sig Note   beta carotene 25000 UNIT capsule Take 25,000 Units by mouth daily.    clonazePAM (KLONOPIN) 0.5 MG tablet Take 1 tablet (0.5 mg total) by mouth at bedtime as needed. 07/17/2020: Takes < once a month   fluticasone (FLONASE) 50 MCG/ACT nasal spray Instill one spray into both nostrils daily    glycopyrrolate (ROBINUL) 2 MG tablet Take 1 tablet (2 mg total) by mouth in the morning.    ibuprofen (ADVIL) 200 MG tablet Take 200 mg by mouth every 6 (six) hours as needed.    levothyroxine (SYNTHROID) 50 MCG tablet Take 1 tablet (50 mcg total) by mouth daily.    pyridOXINE (VITAMIN B-6) 100 MG tablet Take 100 mg by mouth daily.    sildenafil (REVATIO) 20 MG tablet Take 2-5 tablets daily as needed for erectile dysfunction 12/26/2015: Received from: [facility]   simvastatin (ZOCOR) 40 MG tablet Take 1 tablet (40 mg total) by mouth at bedtime.    vitamin C (ASCORBIC ACID) 500 MG tablet Take 500 mg by mouth 2 (two) times daily.    vitamin E 400 UNIT capsule Take 400 Units by mouth daily.    No facility-administered encounter medications on file as of 01/06/2021.     Contacted Adonis Conto Clemons on 01/06/21 for general disease state and medication adherence call.   Patient is not > 5 days past due for refill on the following medications per chart history:  Star Medications: Medication Name/mg Last Fill Days Supply Simvastatin 40 mg  11/20/20 90   What concerns do you have about your medications? No  The patient denies side effects with his  medications.   How often do you forget or accidentally miss a dose? Rarely  Do you use a pillbox? Yes  Are you having any problems getting your medications from your pharmacy? No  Has the cost of your medications been a concern? No If yes, what medication and is patient assistance available or has it been applied for?  Since last visit with CPP, no interventions have been made:   The patient has not had an ED visit since last contact.   The patient denies problems with their health.   he denies  concerns or questions for [place] Sellers at this time.   Counseled patient on:  Great job taking medications   Care Gaps: None  CCM appointment on 06/30/21     Ethelene Hal Clinical Pharmacist Assistant [phone]   Time spent:15

## 2021-01-21 ENCOUNTER — Encounter: Payer: Self-pay | Admitting: Internal Medicine

## 2021-01-21 DIAGNOSIS — E038 Other specified hypothyroidism: Secondary | ICD-10-CM

## 2021-01-21 DIAGNOSIS — H6123 Impacted cerumen, bilateral: Secondary | ICD-10-CM | POA: Diagnosis not present

## 2021-01-21 DIAGNOSIS — H903 Sensorineural hearing loss, bilateral: Secondary | ICD-10-CM | POA: Diagnosis not present

## 2021-01-21 DIAGNOSIS — Z0000 Encounter for general adult medical examination without abnormal findings: Secondary | ICD-10-CM

## 2021-01-21 DIAGNOSIS — R739 Hyperglycemia, unspecified: Secondary | ICD-10-CM

## 2021-01-21 DIAGNOSIS — E782 Mixed hyperlipidemia: Secondary | ICD-10-CM

## 2021-01-21 DIAGNOSIS — M545 Low back pain, unspecified: Secondary | ICD-10-CM | POA: Diagnosis not present

## 2021-01-21 DIAGNOSIS — Z974 Presence of external hearing-aid: Secondary | ICD-10-CM | POA: Diagnosis not present

## 2021-01-28 ENCOUNTER — Encounter: Payer: Self-pay | Admitting: Gastroenterology

## 2021-02-04 ENCOUNTER — Other Ambulatory Visit (INDEPENDENT_AMBULATORY_CARE_PROVIDER_SITE_OTHER): Payer: PPO

## 2021-02-04 ENCOUNTER — Other Ambulatory Visit: Payer: Self-pay

## 2021-02-04 DIAGNOSIS — R739 Hyperglycemia, unspecified: Secondary | ICD-10-CM

## 2021-02-04 DIAGNOSIS — E782 Mixed hyperlipidemia: Secondary | ICD-10-CM | POA: Diagnosis not present

## 2021-02-04 DIAGNOSIS — Z0000 Encounter for general adult medical examination without abnormal findings: Secondary | ICD-10-CM

## 2021-02-04 DIAGNOSIS — E038 Other specified hypothyroidism: Secondary | ICD-10-CM | POA: Diagnosis not present

## 2021-02-04 LAB — LIPID PANEL
Cholesterol: 175 mg/dL (ref 0–200)
HDL: 68.3 mg/dL (ref >39.00)
LDL Cholesterol: 91 mg/dL (ref 0–99)
NonHDL: 106.87
Total CHOL/HDL Ratio: 3
Triglycerides: 77 mg/dL (ref 0.0–149.0)
VLDL: 15.4 mg/dL (ref 0.0–40.0)

## 2021-02-04 LAB — COMPREHENSIVE METABOLIC PANEL
ALT: 22 U/L (ref 0–53)
AST: 28 U/L (ref 0–37)
Albumin: 4.1 g/dL (ref 3.5–5.2)
Alkaline Phosphatase: 39 U/L (ref 39–117)
BUN: 10 mg/dL (ref 6–23)
CO2: 27 mEq/L (ref 19–32)
Calcium: 9.5 mg/dL (ref 8.4–10.5)
Chloride: 102 mEq/L (ref 96–112)
Creatinine, Ser: 0.79 mg/dL (ref 0.40–1.50)
GFR: 87.86 mL/min (ref >60.00)
Glucose, Bld: 87 mg/dL (ref 70–99)
Potassium: 4.4 mEq/L (ref 3.5–5.1)
Sodium: 138 mEq/L (ref 135–145)
Total Bilirubin: 0.7 mg/dL (ref 0.2–1.2)
Total Protein: 7.1 g/dL (ref 6.0–8.3)

## 2021-02-04 LAB — CBC WITH DIFFERENTIAL/PLATELET
Basophils Absolute: 0 10*3/uL (ref 0.0–0.1)
Basophils Relative: 0.2 % (ref 0.0–3.0)
Eosinophils Absolute: 0.1 10*3/uL (ref 0.0–0.7)
Eosinophils Relative: 1.1 % (ref 0.0–5.0)
HCT: 41.5 % (ref 39.0–52.0)
Hemoglobin: 13.9 g/dL (ref 13.0–17.0)
Lymphocytes Relative: 7.5 % — ABNORMAL LOW (ref 12.0–46.0)
Lymphs Abs: 0.5 10*3/uL — ABNORMAL LOW (ref 0.7–4.0)
MCHC: 33.6 g/dL (ref 30.0–36.0)
MCV: 98.6 fl (ref 78.0–100.0)
Monocytes Absolute: 0.8 10*3/uL (ref 0.1–1.0)
Monocytes Relative: 12.8 % — ABNORMAL HIGH (ref 3.0–12.0)
Neutro Abs: 5 10*3/uL (ref 1.4–7.7)
Neutrophils Relative %: 78.4 % — ABNORMAL HIGH (ref 43.0–77.0)
Platelets: 223 10*3/uL (ref 150.0–400.0)
RBC: 4.21 Mil/uL — ABNORMAL LOW (ref 4.22–5.81)
RDW: 14 % (ref 11.5–15.5)
WBC: 6.3 10*3/uL (ref 4.0–10.5)

## 2021-02-04 LAB — HEMOGLOBIN A1C: Hgb A1c MFr Bld: 5.5 % (ref 4.6–6.5)

## 2021-02-04 LAB — TSH: TSH: 1.52 u[IU]/mL (ref 0.35–5.50)

## 2021-02-05 ENCOUNTER — Encounter: Payer: Self-pay | Admitting: Family Medicine

## 2021-02-05 ENCOUNTER — Telehealth: Payer: Self-pay | Admitting: Internal Medicine

## 2021-02-05 ENCOUNTER — Telehealth (INDEPENDENT_AMBULATORY_CARE_PROVIDER_SITE_OTHER): Payer: PPO | Admitting: Family Medicine

## 2021-02-05 VITALS — HR 64 | Temp 99.5°F

## 2021-02-05 DIAGNOSIS — U071 COVID-19: Secondary | ICD-10-CM

## 2021-02-05 MED ORDER — BENZONATATE 100 MG PO CAPS: 100.0000 mg | ORAL_CAPSULE | Freq: Three times a day (TID) | ORAL | 0 refills | Status: DC | PRN

## 2021-02-05 MED ORDER — MOLNUPIRAVIR EUA 200MG CAPSULE: 4.0000 | ORAL_CAPSULE | Freq: Two times a day (BID) | ORAL | 0 refills | Status: DC

## 2021-02-05 NOTE — Telephone Encounter (Signed)
[facility] FYI...   Caller states he has tested positive for Covid this morning. He has a cough that began Tuesday. Wednesday night he began running a fever & having headaches. Temp is currently 99.5. He has medicated, but still has a dull headache. Caller states his O2 sat is 98-99%.   Advised to call PCP within 24 hrs.

## 2021-02-05 NOTE — Telephone Encounter (Signed)
Virtual scheduled for this morning with Dr. Maudie Mercury

## 2021-02-05 NOTE — Progress Notes (Signed)
Virtual Visit via Video Note  I connected with   on 02/05/21 at 11:20 AM EDT by a video enabled telemedicine application and verified that I am speaking with the correct person using two identifiers.  Location patient: home, [place] Location provider:work or home office Persons participating in the virtual visit: patient, provider  I discussed the limitations of evaluation and management by telemedicine and the availability of in person appointments. The patient expressed understanding and agreed to proceed.   HPI:  Acute telemedicine visit for Covid19: -Onset: 2 days ago; positive covid test this morning -Symptoms include: drainage, body aches, cough, headache -Denies: CP, SOB, NVD, inability to eat/drink/get out of bed -temp 98.9, O2 96% -Has tried: aleve -Pertinent past medical history:see below -Pertinent medication allergies: Allergies  Allergen Reactions   Atorvastatin Other (See Comments)    Increased LFTs  -COVID-19 vaccine status: has had 2 doses and 2 boosters -GFR 87  ROS: See pertinent positives and negatives per HPI.  Past Medical History:  Diagnosis Date   Adenomatous colon polyp 09/2007   Arthritis    OA / PAIN LEFT KNEE   Cancer ([place])    Diverticulosis of colon    w/o hemorrage   Heart murmur    TOLD HE HAS A SLIGHT MURMUR   Hyperlipidemia    Inguinal hernia    left side    Thyroid disease     Past Surgical History:  Procedure Laterality Date   BACK SURGERY  05/12/2005   L5   COLONOSCOPY  1999   Negative; Dr Fuller Plan   COLONOSCOPY  2004   tics, hemorrhoids   COLONOSCOPY  2009   polyps ([place]   [place]  10/2003   KNEE SURGERY  1969   Patellar fracture fragment Lt   LAMINECTOMY  2000   L4-5   LUMBAR LAMINECTOMY/DECOMPRESSION MICRODISCECTOMY Right 10/08/2015   Procedure: MICRO LUMBAR DECOMPRESSION L4-L5 AND L5-S1 ON RIGHT ;  Surgeon: Susa Day, MD;  Location: [facility];  Service: Orthopedics;  Laterality: Right;   MASS  EXCISION  04/26/2011   Procedure: MINOR EXCISION OF MASS;  Surgeon: Earnstine Regal, MD;  Location: [facility];  Service: General;  Laterality: Right;  Excise subcutaneous mass right axilla Minor Room   PROSTATECTOMY  04/16/2005   TONSILLECTOMY     TOTAL KNEE ARTHROPLASTY Left 07/03/2013   Procedure: LEFT TOTAL KNEE ARTHROPLASTY;  Surgeon: Mauri Pole, MD;  Location: [facility];  Service: Orthopedics;  Laterality: Left;     Current Outpatient Medications:    benzonatate (TESSALON PERLES) 100 MG capsule, Take 1 capsule (100 mg total) by mouth 3 (three) times daily as needed., Disp: 20 capsule, Rfl: 0   beta carotene 25000 UNIT capsule, Take 25,000 Units by mouth daily., Disp: , Rfl:    clonazePAM (KLONOPIN) 0.5 MG tablet, Take 1 tablet (0.5 mg total) by mouth at bedtime as needed., Disp: 30 tablet, Rfl: 0   fluticasone (FLONASE) 50 MCG/ACT nasal spray, Instill one spray into both nostrils daily, Disp: 48 g, Rfl: 1   glycopyrrolate (ROBINUL) 2 MG tablet, Take 1 tablet (2 mg total) by mouth in the morning., Disp: 90 tablet, Rfl: 1   ibuprofen (ADVIL) 200 MG tablet, Take 200 mg by mouth every 6 (six) hours as needed., Disp: , Rfl:    levothyroxine (SYNTHROID) 50 MCG tablet, Take 1 tablet (50 mcg total) by mouth daily., Disp: 90 tablet, Rfl: 3   molnupiravir EUA (LAGEVRIO) 200 mg  CAPS capsule, Take 4 capsules (800 mg total) by mouth 2 (two) times daily for 5 days., Disp: 40 capsule, Rfl: 0   pyridOXINE (VITAMIN B-6) 100 MG tablet, Take 100 mg by mouth daily., Disp: , Rfl:    simvastatin (ZOCOR) 40 MG tablet, Take 1 tablet (40 mg total) by mouth at bedtime., Disp: 90 tablet, Rfl: 3   vitamin C (ASCORBIC ACID) 500 MG tablet, Take 500 mg by mouth 2 (two) times daily., Disp: , Rfl:    vitamin E 400 UNIT capsule, Take 400 Units by mouth daily., Disp: , Rfl:   EXAM:  VITALS per patient if applicable:  GENERAL: alert, oriented, appears well and in no acute distress  HEENT: atraumatic,  conjunttiva clear, no obvious abnormalities on inspection of external nose and ears  NECK: normal movements of the head and neck  LUNGS: on inspection no signs of respiratory distress, breathing rate appears normal, no obvious gross SOB, gasping or wheezing  CV: no obvious cyanosis  MS: moves all visible extremities without noticeable abnormality  PSYCH/NEURO: pleasant and cooperative, no obvious depression or anxiety, speech and thought processing grossly intact  ASSESSMENT AND PLAN:  Discussed the following assessment and plan:  COVID-19   Discussed treatment options (infusions and oral options and risk of drug interactions), ideal treatment window, potential complications, isolation and precautions for COVID-19.  Discussed possibility of rebound with antivirals and the need to reisolate if it should occur for 5 days. Checked for/reviewed any labs done in the last 90 days with GFR listed in HPI if available. He was qconcerned about the possibility of rebound with Paxlovid and drug interactions were also a concern. After lengthy discussion, the patient opted for treatment with molnupiravir due to being higher risk for complications of covid or severe disease and other factors. Discussed EUA status of this drug and the fact that there is preliminary limited knowledge of risks/interactions/side effects per EUA document vs possible benefits and precautions. This information was shared with patient during the visit and also was provided in patient instructions. Also, advised that patient discuss risks/interactions and use with pharmacist/treatment team as well. . The patient did want a prescription for cough, Tessalon Rx sent.  Other symptomatic care measures summarized in patient instructions.  Advised to seek prompt in person care if worsening, new symptoms arise, or if is not improving with treatment. Discussed options for inperson care if PCP office not available. Did let this patient know that  I only do telemedicine on Tuesdays and Thursdays for [place]. Advised to schedule follow up visit with PCP or [facility] if any further questions or concerns to avoid delays in care.   I discussed the assessment and treatment plan with the patient. The patient was provided an opportunity to ask questions and all were answered. The patient agreed with the plan and demonstrated an understanding of the instructions.     Lucretia Kern, DO

## 2021-02-05 NOTE — Patient Instructions (Addendum)
HOME CARE TIPS:   -I sent the medication(s) we discussed to your pharmacy: Meds ordered this encounter  Medications   molnupiravir EUA (LAGEVRIO) 200 mg CAPS capsule    Sig: Take 4 capsules (800 mg total) by mouth 2 (two) times daily for 5 days.    Dispense:  40 capsule    Refill:  0   benzonatate (TESSALON PERLES) 100 MG capsule    Sig: Take 1 capsule (100 mg total) by mouth 3 (three) times daily as needed.    Dispense:  20 capsule    Refill:  0     -I sent in the [place] treatment or referral you requested per our discussion. Please see the information provided below and discuss further with the pharmacist/treatment team.  -If taking Paxlovid, please review all medications, supplement and over the counter drugs with your pharmacist and ask them to check for any interactions. Please make the following changes to your regular medications while taking Paxlovid:  -If taking an antiviral, there is a chance of rebound illness after finishing your treatment. If you become sick again please isolate for an additional 5 days.    -can use nasal saline a few times per day if you have nasal congestion  -stay hydrated, drink plenty of fluids and eat small healthy meals - avoid dairy  -check out the [facility] website for more information on home care, transmission and treatment for COVID19  -follow up with your doctor in 2-3 days unless improving and feeling better  -stay home while sick, except to seek medical care. If you have COVID19, ideally it would be best to stay home for a full 10 days since the onset of symptoms PLUS one day of no fever and feeling better. Wear a good mask that fits snugly (such as N95 or KN95) if around others to reduce the risk of transmission.  It was nice to meet you today, and I really hope you are feeling better soon. I help [facility] out with telemedicine visits on Tuesdays and Thursdays and am available for visits on those days. If you have any concerns or questions  following this visit please schedule a follow up visit with your Primary Care doctor or seek care at a local urgent care clinic to avoid delays in care.    Seek in person care or schedule a follow up video visit promptly if your symptoms worsen, new concerns arise or you are not improving with treatment. Call 911 and/or seek emergency care if your symptoms are severe or life threatening.    Fact Sheet for Patients And Caregivers Emergency Use Authorization (EUA) Of LAGEVRIOT (molnupiravir) capsules For Coronavirus Disease 2019 (COVID-19)  What is the most important information I should know about LAGEVRIO? LAGEVRIO may cause serious side effects, including: ? LAGEVRIO may cause harm to your unborn baby. It is not known if LAGEVRIO will harm your baby if you take LAGEVRIO during pregnancy. o LAGEVRIO is not recommended for use in pregnancy. o LAGEVRIO has not been studied in pregnancy. LAGEVRIO was studied in pregnant animals only. When LAGEVRIO was given to pregnant animals, LAGEVRIO caused harm to their unborn babies. o You and your healthcare provider may decide that you should take LAGEVRIO during pregnancy if there are no other COVID-19 treatment options approved or authorized by the FDA that are accessible or clinically appropriate for you. o If you and your healthcare provider decide that you should take LAGEVRIO during pregnancy, you and your healthcare provider should discuss the known  and potential benefits and the potential risks of taking LAGEVRIO during pregnancy. For individuals who are able to become pregnant: ? You should use a reliable method of birth control (contraception) consistently and correctly during treatment with LAGEVRIO and for 4 days after the last dose of LAGEVRIO. Talk to your healthcare provider about reliable birth control methods. ? Before starting treatment with [facility] your healthcare provider may do a pregnancy test to see if you are pregnant before  starting treatment with LAGEVRIO. ? Tell your healthcare provider right away if you become pregnant or think you may be pregnant during treatment with LAGEVRIO. Pregnancy Surveillance Program: ? There is a pregnancy surveillance program for individuals who take LAGEVRIO during pregnancy. The purpose of this program is to collect information about the health of you and your baby. Talk to your healthcare provider about how to take part in this program. ? If you take LAGEVRIO during pregnancy and you agree to participate in the pregnancy surveillance program and allow your healthcare provider to share your information with [place], then your healthcare provider will report your use of [place] during pregnancy to [place]. by calling [phone] or [email or web address]. For individuals who are sexually active with partners who are able to become pregnant: ? It is not known if LAGEVRIO can affect sperm. While the risk is regarded as low, animal studies to fully assess the potential for LAGEVRIO to affect the babies of males treated with LAGEVRIO have not been completed. A reliable method of birth control (contraception) should be used consistently and correctly during treatment with LAGEVRIO and for at least 3 months after the last dose. The risk to sperm beyond 3 months is not known. Studies to understand the risk to sperm beyond 3 months are ongoing. Talk to your healthcare provider about reliable birth control methods. Talk to your healthcare provider if you have questions or concerns about how LAGEVRIO may affect sperm. You are being given this fact sheet because your healthcare provider believes it is necessary to provide you with LAGEVRIO for the treatment of adults with mild-to-moderate coronavirus disease 2019 (COVID-19) with positive results of direct SARS-CoV-2 viral testing, and who are at high risk for progression to severe COVID-19 including  hospitalization or death, and for whom other COVID-19 treatment options approved or authorized by the FDA are not accessible or clinically appropriate. The U.S. Food and Drug Administration (FDA) has issued an Emergency Use Authorization (EUA) to make LAGEVRIO available during the COVID-19 pandemic (for more details about an EUA please see "What is an Emergency Use Authorization?" at the end of this document). LAGEVRIO is not an FDA-approved medicine in the [place]. Read this Fact Sheet for information about LAGEVRIO. Talk to your healthcare provider about your options if you have any questions. It is your choice to take LAGEVRIO.  What is COVID-19? COVID-19 is caused by a virus called a coronavirus. You can get COVID-19 through close contact with another person who has the virus. COVID-19 illnesses have ranged from very mild-to-severe, including illness resulting in death. While information so far suggests that most COVID-19 illness is mild, serious illness can happen and may cause some of your other medical conditions to become worse. Older people and people of all ages with severe, long lasting (chronic) medical conditions like heart disease, lung disease and diabetes, for example seem to be at higher risk of being hospitalized for COVID-19.  What is LAGEVRIO? LAGEVRIO is an investigational medicine used to  treat mild-to-moderate COVID-19 in adults: ? with positive results of direct SARS-CoV-2 viral testing, and ? who are at high risk for progression to severe COVID-19 including hospitalization or death, and for whom other COVID-19 treatment options approved or authorized by the FDA are not accessible or clinically appropriate. The FDA has authorized the emergency use of LAGEVRIO for the treatment of mild-tomoderate COVID-19 in adults under an EUA. For more information on EUA, see the "What is an Emergency Use Authorization (EUA)?" section at the end of this Fact Sheet. LAGEVRIO  is not authorized: ? for use in people less than 75 years of age. ? for prevention of COVID-19. ? for people needing hospitalization for COVID-19. ? for use for longer than 5 consecutive days.  What should I tell my healthcare provider before I take LAGEVRIO? Tell your healthcare provider if you: ? Have any allergies ? Are breastfeeding or plan to breastfeed ? Have any serious illnesses ? Are taking any medicines (prescription, over-the-counter, vitamins, or herbal products).  How do I take LAGEVRIO? ? Take LAGEVRIO exactly as your healthcare provider tells you to take it. ? Take 4 capsules of LAGEVRIO every 12 hours (for example, at 8 am and at 8 pm) ? Take LAGEVRIO for 5 days. It is important that you complete the full 5 days of treatment with LAGEVRIO. Do not stop taking LAGEVRIO before you complete the full 5 days of treatment, even if you feel better. ? Take LAGEVRIO with or without food. ? You should stay in isolation for as long as your healthcare provider tells you to. Talk to your healthcare provider if you are not sure about how to properly isolate while you have COVID-19. ? Swallow LAGEVRIO capsules whole. Do not open, break, or crush the capsules. If you cannot swallow capsules whole, tell your healthcare provider. ? What to do if you miss a dose: o If it has been less than 10 hours since the missed dose, take it as soon as you remember o If it has been more than 10 hours since the missed dose, skip the missed dose and take your dose at the next scheduled time. ? Do not double the dose of LAGEVRIO to make up for a missed dose.  What are the important possible side effects of LAGEVRIO? ? See, "What is the most important information I should know about LAGEVRIO?" ? Allergic Reactions. Allergic reactions can happen in people taking LAGEVRIO, even after only 1 dose. Stop taking LAGEVRIO and call your healthcare provider right away if you get any of the following symptoms  of an allergic reaction: o hives o rapid heartbeat o trouble swallowing or breathing o swelling of the mouth, lips, or face o throat tightness o hoarseness o skin rash The most common side effects of LAGEVRIO are: ? diarrhea ? nausea ? dizziness These are not all the possible side effects of LAGEVRIO. Not many people have taken LAGEVRIO. Serious and unexpected side effects may happen. This medicine is still being studied, so it is possible that all of the risks are not known at this time.  What other treatment choices are there?  Veklury (remdesivir) is FDA-approved as an intravenous (IV) infusion for the treatment of mildto-moderate URKYH-06 in certain adults and children. Talk with your doctor to see if Marijean Heath is appropriate for you. Like LAGEVRIO, FDA may also allow for the emergency use of other medicines to treat people with COVID-19. Go to LacrosseProperties.si for more information. It is your choice to be  treated or not to be treated with LAGEVRIO. Should you decide not to take it, it will not change your standard medical care.  What if I am breastfeeding? Breastfeeding is not recommended during treatment with LAGEVRIO and for 4 days after the last dose of LAGEVRIO. If you are breastfeeding or plan to breastfeed, talk to your healthcare provider about your options and specific situation before taking LAGEVRIO.  How do I report side effects with LAGEVRIO? Contact your healthcare provider if you have any side effects that bother you or do not go away. Report side effects to FDA MedWatch at SmoothHits.hu or call 1-800-FDA-1088 (1- (720) 521-2727).  How should I store [place]? ? Store LAGEVRIO capsules at room temperature between 57F to 41F (20C to 25C). ? Keep LAGEVRIO and all medicines out of the reach of children and pets. How can I learn more about COVID-19? ?  Ask your healthcare provider. ? Visit SeekRooms.co.uk ? Contact your local or state public health department. ? Call [place] at [phone] (toll free in the [place] ? Visit www.molnupiravir.com  What Is an Emergency Use Authorization (EUA)? The [place] FDA has made [place] available under an emergency access mechanism called an Emergency Use Authorization (EUA) The EUA is supported by a Presenter, broadcasting Health and Human Service (HHS) declaration that circumstances exist to justify emergency use of drugs and biological products during the COVID-19 pandemic. LAGEVRIO for the treatment of mild-to-moderate COVID-19 in adults with positive results of direct SARS-CoV-2 viral testing, who are at high risk for progression to severe COVID-19, including hospitalization or death, and for whom alternative COVID-19 treatment options approved or authorized by FDA are not accessible or clinically appropriate, has not undergone the same type of review as an FDA-approved product. In issuing an EUA under the FIEPP-29 public health emergency, the FDA has determined, among other things, that based on the total amount of scientific evidence available including data from adequate and well-controlled clinical trials, if available, it is reasonable to believe that the product may be effective for diagnosing, treating, or preventing COVID-19, or a serious or life-threatening disease or condition caused by COVID-19; that the known and potential benefits of the product, when used to diagnose, treat, or prevent such disease or condition, outweigh the known and potential risks of such product; and that there are no adequate, approved, and available alternatives.  All of these criteria must be met to allow for the product to be used in the treatment of patients during the COVID-19 pandemic. The EUA for LAGEVRIO is in effect for the duration of the COVID-19 declaration justifying emergency use of  LAGEVRIO, unless terminated or revoked (after which LAGEVRIO may no longer be used under the EUA). For patent information: http://rogers.info/ Copyright  2021-2022 [employer] [place], [place] and its affiliates. All rights reserved. [id number] Revised: March 2022

## 2021-02-05 NOTE — Telephone Encounter (Signed)
Amy from [facility] Nurse Triage calling on behalf of patient  Patient COVID+ (at home test) started experiencing symptoms (cough,fever headaches 09.13.22)  Would like Paxlovid sent to pharmacy if possible

## 2021-02-10 NOTE — Patient Instructions (Addendum)
Flu immunization administered today.  An EKG was done today.   Medications changes include :   none   An Echo was ordered.    Please followup in 1 year   Health Maintenance, Male Adopting a healthy lifestyle and getting preventive care are important in promoting health and wellness. Ask your health care provider about: The right schedule for you to have regular tests and exams. Things you can do on your own to prevent diseases and keep yourself healthy. What should I know about diet, weight, and exercise? Eat a healthy diet  Eat a diet that includes plenty of vegetables, fruits, low-fat dairy products, and lean protein. Do not eat a lot of foods that are high in solid fats, added sugars, or sodium. Maintain a healthy weight Body mass index (BMI) is a measurement that can be used to identify possible weight problems. It estimates body fat based on height and weight. Your health care provider can help determine your BMI and help you achieve or maintain a healthy weight. Get regular exercise Get regular exercise. This is one of the most important things you can do for your health. Most adults should: Exercise for at least 150 minutes each week. The exercise should increase your heart rate and make you sweat (moderate-intensity exercise). Do strengthening exercises at least twice a week. This is in addition to the moderate-intensity exercise. Spend less time sitting. Even light physical activity can be beneficial. Watch cholesterol and blood lipids Have your blood tested for lipids and cholesterol at 74 years of age, then have this test every 5 years. You may need to have your cholesterol levels checked more often if: Your lipid or cholesterol levels are high. You are older than 74 years of age. You are at high risk for heart disease. What should I know about cancer screening? Many types of cancers can be detected early and may often be prevented. Depending on your health history and  family history, you may need to have cancer screening at various ages. This may include screening for: Colorectal cancer. Prostate cancer. Skin cancer. Lung cancer. What should I know about heart disease, diabetes, and high blood pressure? Blood pressure and heart disease High blood pressure causes heart disease and increases the risk of stroke. This is more likely to develop in people who have high blood pressure readings, are of African descent, or are overweight. Talk with your health care provider about your target blood pressure readings. Have your blood pressure checked: Every 3-5 years if you are 73-5 years of age. Every year if you are 65 years old or older. If you are between the ages of 37 and 33 and are a current or former smoker, ask your health care provider if you should have a one-time screening for abdominal aortic aneurysm (AAA). Diabetes Have regular diabetes screenings. This checks your fasting blood sugar level. Have the screening done: Once every three years after age 17 if you are at a normal weight and have a low risk for diabetes. More often and at a younger age if you are overweight or have a high risk for diabetes. What should I know about preventing infection? Hepatitis B If you have a higher risk for hepatitis B, you should be screened for this virus. Talk with your health care provider to find out if you are at risk for hepatitis B infection. Hepatitis C Blood testing is recommended for: Everyone born from [birth year] through 1964. Anyone with known risk factors for hepatitis C.  Sexually transmitted infections (STIs) You should be screened each year for STIs, including gonorrhea and chlamydia, if: You are sexually active and are younger than 74 years of age. You are older than 74 years of age and your health care provider tells you that you are at risk for this type of infection. Your sexual activity has changed since you were last screened, and you are at increased  risk for chlamydia or gonorrhea. Ask your health care provider if you are at risk. Ask your health care provider about whether you are at high risk for HIV. Your health care provider may recommend a prescription medicine to help prevent HIV infection. If you choose to take medicine to prevent HIV, you should first get tested for HIV. You should then be tested every 3 months for as long as you are taking the medicine. Follow these instructions at home: Lifestyle Do not use any products that contain nicotine or tobacco, such as cigarettes, e-cigarettes, and chewing tobacco. If you need help quitting, ask your health care provider. Do not use street drugs. Do not share needles. Ask your health care provider for help if you need support or information about quitting drugs. Alcohol use Do not drink alcohol if your health care provider tells you not to drink. If you drink alcohol: Limit how much you have to 0-2 drinks a day. Be aware of how much alcohol is in your drink. In the [place] one drink equals one 12 oz bottle of beer (355 mL), one 5 oz glass of wine (148 mL), or one 1 oz glass of hard liquor (44 mL). General instructions Schedule regular health, dental, and eye exams. Stay current with your vaccines. Tell your health care provider if: You often feel depressed. You have ever been abused or do not feel safe at home. Summary Adopting a healthy lifestyle and getting preventive care are important in promoting health and wellness. Follow your health care provider's instructions about healthy diet, exercising, and getting tested or screened for diseases. Follow your health care provider's instructions on monitoring your cholesterol and blood pressure. This information is not intended to replace advice given to you by your health care provider. Make sure you discuss any questions you have with your health care provider. Document Revised: 07/18/2020 Document Reviewed: 05/03/2018 Elsevier Patient  Education  2022 [employer].

## 2021-02-10 NOTE — Progress Notes (Signed)
Subjective:    Patient ID: Jeremy Waters, male    DOB: 1945-11-03, 75 y.o.   MRN: [id number]   This visit occurred during the SARS-CoV-2 public health emergency.  Safety protocols were in place, including screening questions prior to the visit, additional usage of staff PPE, and extensive cleaning of exam room while observing appropriate contact time as indicated for disinfecting solutions.   HPI He is here for a physical exam.    Had covid last week- still having occ cough and fatigue.  Everyday he feels a little better.    Otherwise feels good.    Medications and allergies reviewed with patient and updated if appropriate.  Patient Active Problem List   Diagnosis Date Noted   LUQ pain 01/16/2019   Right cervical radiculopathy 03/03/2017   Carotid artery disease ([place]) 09/15/2016   Spinal stenosis, lumbar 10/08/2015   Sensorineural hearing loss of both ears 04/10/2014   Organic impotence 04/10/2014   Hyperglycemia 04/02/2014   S/P left TKA 07/03/2013   Left knee DJD 06/07/2013   Hypothyroidism 03/03/2012   Axillary mass, right 04/07/2011   ROSACEA 03/19/2010   ARTHRALGIA 03/19/2010   CERVICALGIA 09/05/2009   CARPAL TUNNEL SYNDROME 06/17/2008   DEGENERATIVE JOINT DISEASE, GENERALIZED 06/17/2008   CERVICAL RADICULOPATHY, LEFT 06/17/2008   Disturbance in sleep behavior 03/04/2008   PROSTATE CANCER, HX OF 03/04/2008   Hyperlipidemia 06/15/2007   DIVERTICULOSIS, COLON W/O HEM 03/03/2007    Current Outpatient Medications on File Prior to Visit  Medication Sig Dispense Refill   benzonatate (TESSALON PERLES) 100 MG capsule Take 1 capsule (100 mg total) by mouth 3 (three) times daily as needed. 20 capsule 0   beta carotene 25000 UNIT capsule Take 25,000 Units by mouth daily.     clonazePAM (KLONOPIN) 0.5 MG tablet Take 1 tablet (0.5 mg total) by mouth at bedtime as needed. 30 tablet 0   fluticasone (FLONASE) 50 MCG/ACT nasal spray Instill one spray into both nostrils  daily 48 g 1   glycopyrrolate (ROBINUL) 2 MG tablet Take 1 tablet (2 mg total) by mouth in the morning. 90 tablet 1   ibuprofen (ADVIL) 200 MG tablet Take 200 mg by mouth every 6 (six) hours as needed.     levothyroxine (SYNTHROID) 50 MCG tablet Take 1 tablet (50 mcg total) by mouth daily. 90 tablet 3   pyridOXINE (VITAMIN B-6) 100 MG tablet Take 100 mg by mouth daily.     simvastatin (ZOCOR) 40 MG tablet Take 1 tablet (40 mg total) by mouth at bedtime. 90 tablet 3   vitamin C (ASCORBIC ACID) 500 MG tablet Take 500 mg by mouth 2 (two) times daily.     vitamin E 400 UNIT capsule Take 400 Units by mouth daily.     No current facility-administered medications on file prior to visit.    Past Medical History:  Diagnosis Date   Adenomatous colon polyp 09/2007   Arthritis    OA / PAIN LEFT KNEE   Cancer ([place])    Diverticulosis of colon    w/o hemorrage   Heart murmur    TOLD HE HAS A SLIGHT MURMUR   Hyperlipidemia    Inguinal hernia    left side    Thyroid disease     Past Surgical History:  Procedure Laterality Date   BACK SURGERY  05/12/2005   L5   COLONOSCOPY  1999   Negative; Dr Fuller Plan   COLONOSCOPY  2004   tics, hemorrhoids   COLONOSCOPY  2009   polyps ([place]   HERNIA REPAIR  1987   HERNIA REPAIR  10/2003   KNEE SURGERY  1969   Patellar fracture fragment Lt   LAMINECTOMY  2000   L4-5   LUMBAR LAMINECTOMY/DECOMPRESSION MICRODISCECTOMY Right 10/08/2015   Procedure: MICRO LUMBAR DECOMPRESSION L4-L5 AND L5-S1 ON RIGHT ;  Surgeon: Susa Day, MD;  Location: [facility];  Service: Orthopedics;  Laterality: Right;   MASS EXCISION  04/26/2011   Procedure: MINOR EXCISION OF MASS;  Surgeon: Earnstine Regal, MD;  Location: [facility];  Service: General;  Laterality: Right;  Excise subcutaneous mass right axilla Minor Room   PROSTATECTOMY  04/16/2005   TONSILLECTOMY     TOTAL KNEE ARTHROPLASTY Left 07/03/2013   Procedure: LEFT TOTAL KNEE ARTHROPLASTY;  Surgeon: Mauri Pole, MD;  Location: [facility];  Service: Orthopedics;  Laterality: Left;    Social History   Socioeconomic History   Marital status: Married    Spouse name: Not on file   Number of children: 2   Years of education: Not on file   Highest education level: Not on file  Occupational History   Occupation: insur exe    Employer: [employer]  Tobacco Use   Smoking status: Former   Smokeless tobacco: Never   Tobacco comments:    stopped smoking cigarettes 1983, 1 cigar /day 2001-2013  Vaping Use   Vaping Use: Never used  Substance and Sexual Activity   Alcohol use: Yes    Alcohol/week: 7.0 - 14.0 standard drinks    Types: 7 - 14 Standard drinks or equivalent per week    Comment: scotch or wine daily   Drug use: No   Sexual activity: Yes  Other Topics Concern   Not on file  Social History Narrative   Exercises daily            Social Determinants of Health   Financial Resource Strain: Low Risk    Difficulty of Paying Living Expenses: Not hard at all  Food Insecurity: Not on file  Transportation Needs: Not on file  Physical Activity: Not on file  Stress: Not on file  Social Connections: Not on file    Family History  Problem Relation Age of Onset   Lung cancer Mother    COPD Mother    Cancer Father        Bladder Cancer   Prostate cancer Father    Colon cancer Father 57   Urolithiasis Father    Alcohol abuse Paternal Uncle    Stroke Paternal Uncle 45   Hypertension Paternal Uncle    Lung cancer Sister    Asthma Neg Hx    Heart disease Neg Hx    Esophageal cancer Neg Hx    Stomach cancer Neg Hx    Liver disease Neg Hx     Review of Systems  Constitutional:  Positive for fatigue (from covid). Negative for chills and fever.  Eyes:  Negative for visual disturbance.  Respiratory:  Positive for cough (occ since covid) and shortness of breath (mild from covid). Negative for wheezing.   Cardiovascular:  Negative for chest pain, palpitations and leg  swelling.  Gastrointestinal:  Negative for abdominal pain, blood in stool, constipation, diarrhea and nausea.       Occ gerd  Genitourinary:  Negative for dysuria and hematuria.  Musculoskeletal:  Positive for back pain (chronic). Negative for arthralgias.  Skin:  Negative for rash.  Neurological:  Negative for light-headedness and  headaches.  Psychiatric/Behavioral:  Negative for dysphoric mood. The patient is not nervous/anxious.       Objective:   Vitals:   02/11/21 1518  BP: 108/70  Pulse: 60  Temp: 98.5 F (36.9 C)  SpO2: 98%   Filed Weights   02/11/21 1518  Weight: 148 lb (67.1 kg)   Body mass index is 23.18 kg/m.  BP Readings from Last 3 Encounters:  02/11/21 108/70  04/11/20 114/62  02/11/20 130/68    Wt Readings from Last 3 Encounters:  02/11/21 148 lb (67.1 kg)  04/11/20 150 lb (68 kg)  02/11/20 152 lb 3.2 oz (69 kg)    Depression screen [facility] 2/9 02/11/2021 02/11/2020 12/01/2018 10/11/2017 03/03/2017  Decreased Interest 0 0 0 0 0  Down, Depressed, Hopeless 0 0 0 0 0  PHQ - 2 Score 0 0 0 0 0  Altered sleeping 2 - - - -  Tired, decreased energy 1 - - - -  Change in appetite 0 - - - -  Feeling bad or failure about yourself  0 - - - -  Trouble concentrating 0 - - - -  Moving slowly or fidgety/restless 0 - - - -  Suicidal thoughts 0 - - - -  PHQ-9 Score 3 - - - -  Difficult doing work/chores Not difficult at all - - - -    GAD 7 : Generalized Anxiety Score 02/11/2021  Nervous, Anxious, on Edge 0  Control/stop worrying 0  Worry too much - different things 0  Trouble relaxing 0  Restless 0  Easily annoyed or irritable 0  Afraid - awful might happen 0  Total GAD 7 Score 0        Physical Exam Constitutional: He appears well-developed and well-nourished. No distress.  HENT:  Head: Normocephalic and atraumatic.  Right Ear: External ear normal.  Left Ear: External ear normal.  Mouth/Throat: deferred Normal ear canals and TM b/l  Eyes: Conjunctivae  and EOM are normal.  Neck: Neck supple. No tracheal deviation present. No thyromegaly present.  No carotid bruit  Cardiovascular: Normal rate, regular rhythm with occasional premature beat, normal heart sounds and intact distal pulses.   2/6 sys murmur heard. Pulmonary/Chest: Effort normal and breath sounds normal. No respiratory distress. He has no wheezes. He has no rales.  Abdominal: Soft. He exhibits no distension. There is no tenderness.  Genitourinary: deferred  Musculoskeletal: He exhibits no edema.  Lymphadenopathy:   He has no cervical adenopathy.  Skin: Skin is warm and dry. He is not diaphoretic.  Psychiatric: He has a normal mood and affect. His behavior is normal.    Lab Results  Component Value Date   WBC 6.3 02/04/2021   HGB 13.9 02/04/2021   HCT 41.5 02/04/2021   PLT 223.0 02/04/2021   GLUCOSE 87 02/04/2021   CHOL 175 02/04/2021   TRIG 77.0 02/04/2021   HDL 68.30 02/04/2021   LDLDIRECT 179.6 01/04/2013   LDLCALC 91 02/04/2021   ALT 22 02/04/2021   AST 28 02/04/2021   NA 138 02/04/2021   K 4.4 02/04/2021   CL 102 02/04/2021   CREATININE 0.79 02/04/2021   BUN 10 02/04/2021   CO2 27 02/04/2021   TSH 1.52 02/04/2021   INR 0.97 06/15/2013   HGBA1C 5.5 02/04/2021        Assessment & Plan:   Physical exam: Screening blood work  ordered Exercise   regular Weight  normal Substance abuse   none   Screened for depression using  the PHQ 9 scale.  No evidence of depression.   Screened for anxiety using GAD7 Scale.  No evidence of anxiety.   Reviewed recommended immunizations.   Health Maintenance  Topic Date Due   COLONOSCOPY (Pts 45-48yrs Insurance coverage will need to be confirmed)  10/20/2020   INFLUENZA VACCINE  12/22/2020   TETANUS/TDAP  08/02/2027   COVID-19 Vaccine  Completed   Hepatitis C Screening  Completed   Zoster Vaccines- Shingrix  Completed   HPV VACCINES  Aged Out     See Problem List for Assessment and Plan of chronic medical  problems.

## 2021-02-11 ENCOUNTER — Ambulatory Visit: Payer: PPO

## 2021-02-11 ENCOUNTER — Ambulatory Visit (INDEPENDENT_AMBULATORY_CARE_PROVIDER_SITE_OTHER): Payer: PPO | Admitting: Internal Medicine

## 2021-02-11 ENCOUNTER — Encounter: Payer: Self-pay | Admitting: Internal Medicine

## 2021-02-11 ENCOUNTER — Other Ambulatory Visit: Payer: Self-pay

## 2021-02-11 VITALS — BP 108/70 | HR 60 | Temp 98.5°F | Ht 67.0 in | Wt 148.0 lb

## 2021-02-11 DIAGNOSIS — G479 Sleep disorder, unspecified: Secondary | ICD-10-CM | POA: Diagnosis not present

## 2021-02-11 DIAGNOSIS — I493 Ventricular premature depolarization: Secondary | ICD-10-CM | POA: Insufficient documentation

## 2021-02-11 DIAGNOSIS — R739 Hyperglycemia, unspecified: Secondary | ICD-10-CM

## 2021-02-11 DIAGNOSIS — Z23 Encounter for immunization: Secondary | ICD-10-CM | POA: Diagnosis not present

## 2021-02-11 DIAGNOSIS — R011 Cardiac murmur, unspecified: Secondary | ICD-10-CM | POA: Diagnosis not present

## 2021-02-11 DIAGNOSIS — Z1331 Encounter for screening for depression: Secondary | ICD-10-CM

## 2021-02-11 DIAGNOSIS — E782 Mixed hyperlipidemia: Secondary | ICD-10-CM | POA: Diagnosis not present

## 2021-02-11 DIAGNOSIS — I4949 Other premature depolarization: Secondary | ICD-10-CM | POA: Insufficient documentation

## 2021-02-11 DIAGNOSIS — Z0000 Encounter for general adult medical examination without abnormal findings: Secondary | ICD-10-CM

## 2021-02-11 DIAGNOSIS — E038 Other specified hypothyroidism: Secondary | ICD-10-CM

## 2021-02-11 NOTE — Assessment & Plan Note (Signed)
Chronic Check lipid panel  Continue simvastatin 40 mg daily Regular exercise and healthy diet encouraged  

## 2021-02-11 NOTE — Assessment & Plan Note (Signed)
Chronic Controlled, stable Continue clonazepam 0.5 mg HS prn  

## 2021-02-11 NOTE — Assessment & Plan Note (Signed)
Chronic Echo to evaluate murmur

## 2021-02-11 NOTE — Assessment & Plan Note (Addendum)
Chronic  Clinically euthyroid tsh normal Currently taking levothyroxine 50 mcg daily

## 2021-02-11 NOTE — Assessment & Plan Note (Signed)
Chronic Lab Results  Component Value Date   HGBA1C 5.5 02/04/2021   Sugars in normal range

## 2021-02-11 NOTE — Assessment & Plan Note (Addendum)
Acute Regular heart rhythm on exam but premature beats heard - has not had an EKG in a while  EKG - sinus rhythm w freq PVCS in bigeminy, incomplete RBBB compared to last EKG 09/2015 PVCs are new Echo ordered to evaluate murmur ? Related to covid - dx last week Given frequent PVCs will refer to cardiology

## 2021-02-12 ENCOUNTER — Encounter: Payer: Self-pay | Admitting: Gastroenterology

## 2021-02-12 NOTE — Addendum Note (Signed)
Addended by: Marcina Millard on: 02/12/2021 07:57 AM   Modules accepted: Orders

## 2021-02-13 DIAGNOSIS — M545 Low back pain, unspecified: Secondary | ICD-10-CM | POA: Diagnosis not present

## 2021-02-17 ENCOUNTER — Encounter: Payer: Self-pay | Admitting: Internal Medicine

## 2021-02-18 ENCOUNTER — Telehealth: Payer: Self-pay | Admitting: Internal Medicine

## 2021-02-18 NOTE — Telephone Encounter (Signed)
Patient sent message via mychart  Wants to know when he will be scheduled for hi echocardiogram.. says he is suppose to have this done before his visit w/ the cardiologist on 10/18  Please fu [phone]

## 2021-02-18 NOTE — Telephone Encounter (Signed)
Last read by Faustino Congress at 12:08 PM on 02/18/2021.

## 2021-02-23 ENCOUNTER — Ambulatory Visit (INDEPENDENT_AMBULATORY_CARE_PROVIDER_SITE_OTHER): Payer: PPO

## 2021-02-23 DIAGNOSIS — Z0000 Encounter for general adult medical examination without abnormal findings: Secondary | ICD-10-CM | POA: Diagnosis not present

## 2021-02-23 NOTE — Progress Notes (Signed)
I connected with Jeremy Waters today by telephone and verified that I am speaking with the correct person using two identifiers. Location patient: home Location provider: work Persons participating in the virtual visit: patient, provider.   I discussed the limitations, risks, security and privacy concerns of performing an evaluation and management service by telephone and the availability of in person appointments. I also discussed with the patient that there may be a patient responsible charge related to this service. The patient expressed understanding and verbally consented to this telephonic visit.    Interactive audio and video telecommunications were attempted between this provider and patient, however failed, due to patient having technical difficulties OR patient did not have access to video capability.  We continued and completed visit with audio only.  Some vital signs may be absent or patient reported.   Time Spent with patient on telephone encounter: 40 minutes  Subjective:   Jeremy Waters is a 75 y.o. male who presents for Medicare Annual/Subsequent preventive examination.  Review of Systems      Cardiac Risk Factors include: advanced age (>20men, >64 women);dyslipidemia;family history of premature cardiovascular disease;male gender     Objective:    There were no vitals filed for this visit. There is no height or weight on file to calculate BMI.  Advanced Directives 02/23/2021 12/01/2018 10/20/2017 10/11/2017 12/26/2015 10/08/2015 10/03/2015  Does Patient Have a Medical Advance Directive? Yes Yes Yes Yes Yes Yes Yes  Type of Advance Directive Living will;Healthcare Power of [place];Living will - [place];Living will [place];Living will [place];Living will [place];Living will  Does patient want to make changes to medical advance directive? No - Patient declined - - -  - Yes - information given -  Copy of [place] in Chart? No - copy requested No - copy requested - No - copy requested - - (No Data)    Current Medications (verified) Outpatient Encounter Medications as of 02/23/2021  Medication Sig   benzonatate (TESSALON PERLES) 100 MG capsule Take 1 capsule (100 mg total) by mouth 3 (three) times daily as needed.   beta carotene 25000 UNIT capsule Take 25,000 Units by mouth daily.   clonazePAM (KLONOPIN) 0.5 MG tablet Take 1 tablet (0.5 mg total) by mouth at bedtime as needed.   fluticasone (FLONASE) 50 MCG/ACT nasal spray Instill one spray into both nostrils daily   glycopyrrolate (ROBINUL) 2 MG tablet Take 1 tablet (2 mg total) by mouth in the morning.   ibuprofen (ADVIL) 200 MG tablet Take 200 mg by mouth every 6 (six) hours as needed.   levothyroxine (SYNTHROID) 50 MCG tablet Take 1 tablet (50 mcg total) by mouth daily.   pyridOXINE (VITAMIN B-6) 100 MG tablet Take 100 mg by mouth daily.   simvastatin (ZOCOR) 40 MG tablet Take 1 tablet (40 mg total) by mouth at bedtime.   vitamin C (ASCORBIC ACID) 500 MG tablet Take 500 mg by mouth 2 (two) times daily.   vitamin E 400 UNIT capsule Take 400 Units by mouth daily.   No facility-administered encounter medications on file as of 02/23/2021.    Allergies (verified) Atorvastatin   History: Past Medical History:  Diagnosis Date   Adenomatous colon polyp 09/2007   Arthritis    OA / PAIN LEFT KNEE   Cancer ([place])    Diverticulosis of colon    w/o hemorrage   Heart murmur    TOLD HE  HAS A SLIGHT MURMUR   Hyperlipidemia    Inguinal hernia    left side    Thyroid disease    Past Surgical History:  Procedure Laterality Date   BACK SURGERY  05/12/2005   L5   COLONOSCOPY  1999   Negative; Dr Fuller Plan   COLONOSCOPY  2004   tics, hemorrhoids   COLONOSCOPY  2009   polyps ([place].)   [place]  10/2003   KNEE SURGERY  1969   Patellar fracture fragment Lt    LAMINECTOMY  2000   L4-5   LUMBAR LAMINECTOMY/DECOMPRESSION MICRODISCECTOMY Right 10/08/2015   Procedure: MICRO LUMBAR DECOMPRESSION L4-L5 AND L5-S1 ON RIGHT ;  Surgeon: Susa Day, MD;  Location: [facility];  Service: Orthopedics;  Laterality: Right;   MASS EXCISION  04/26/2011   Procedure: MINOR EXCISION OF MASS;  Surgeon: Earnstine Regal, MD;  Location: [place] [facility];  Service: General;  Laterality: Right;  Excise subcutaneous mass right axilla Minor Room   PROSTATECTOMY  04/16/2005   TONSILLECTOMY     TOTAL KNEE ARTHROPLASTY Left 07/03/2013   Procedure: LEFT TOTAL KNEE ARTHROPLASTY;  Surgeon: Mauri Pole, MD;  Location: [facility];  Service: Orthopedics;  Laterality: Left;   Family History  Problem Relation Age of Onset   Lung cancer Mother    COPD Mother    Cancer Father        Bladder Cancer   Prostate cancer Father    Colon cancer Father 62   Urolithiasis Father    Alcohol abuse Paternal Uncle    Stroke Paternal Uncle 70   Hypertension Paternal Uncle    Lung cancer Sister    Asthma Neg Hx    Heart disease Neg Hx    Esophageal cancer Neg Hx    Stomach cancer Neg Hx    Liver disease Neg Hx    Social History   Socioeconomic History   Marital status: Married    Spouse name: Not on file   Number of children: 2   Years of education: Not on file   Highest education level: Not on file  Occupational History   Occupation: insur exe    Employer: [employer]  Tobacco Use   Smoking status: Former   Smokeless tobacco: Never   Tobacco comments:    stopped smoking cigarettes 1983, 1 cigar /day 2001-2013  Vaping Use   Vaping Use: Never used  Substance and Sexual Activity   Alcohol use: Yes    Alcohol/week: 7.0 - 14.0 standard drinks    Types: 7 - 14 Standard drinks or equivalent per week    Comment: scotch or wine daily   Drug use: No   Sexual activity: Yes  Other Topics Concern   Not on file  Social History Narrative   Exercises daily             Social Determinants of Health   Financial Resource Strain: Low Risk    Difficulty of Paying Living Expenses: Not hard at all  Food Insecurity: No Food Insecurity   Worried About Charity fundraiser in the Last Year: Never true   [place] in the Last Year: Never true  Transportation Needs: No Transportation Needs   Lack of Transportation (Medical): No   Lack of Transportation (Non-Medical): No  Physical Activity: Sufficiently Active   Days of Exercise per Week: 5 days   Minutes of Exercise per Session: 30 min  Stress: No  Stress Concern Present   Feeling of Stress : Not at all  Social Connections: Socially Integrated   Frequency of Communication with Friends and Family: More than three times a week   Frequency of Social Gatherings with Friends and Family: Three times a week   Attends Religious Services: 1 to 4 times per year   Active Member of Clubs or Organizations: Yes   Attends Archivist Meetings: 1 to 4 times per year   Marital Status: Married    Tobacco Counseling Counseling given: Not Answered Tobacco comments: stopped smoking cigarettes 1983, 1 cigar /day 2001-2013   Clinical Intake:  Pre-visit preparation completed: Yes  Pain : No/denies pain     Nutritional Risks: None Diabetes: No  How often do you need to have someone help you when you read instructions, pamphlets, or other written materials from your doctor or pharmacy?: 1 - Never What is the last grade level you completed in school?: Bachelor's Degree  Diabetic? no  Interpreter Needed?: No  Information entered by :: Lisette Abu, LPN   Activities of Daily Living In your present state of health, do you have any difficulty performing the following activities: 02/23/2021 02/11/2021  Hearing? N N  Vision? N N  Difficulty concentrating or making decisions? N N  Walking or climbing stairs? N N  Dressing or bathing? N N  Doing errands, shopping? N N  Preparing Food and eating ? N -   Using the Toilet? N -  In the past six months, have you accidently leaked urine? N -  Do you have problems with loss of bowel control? N -  Managing your Medications? N -  Managing your Finances? N -  Housekeeping or managing your Housekeeping? N -  Some recent data might be hidden    Patient Care Team: Binnie Rail, MD as PCP - General (Internal Medicine) Newt Lukes, AUD (Audiology) Ladene Artist, MD as Consulting Physician (Gastroenterology) Charlton Haws, [facility] as Pharmacist (Pharmacist) Calvert Cantor, MD as Consulting Physician (Ophthalmology)  Indicate any recent Medical Services you may have received from other than [facility] providers in the past year (date may be approximate).     Assessment:   This is a routine wellness examination for [place].  Hearing/Vision screen Hearing Screening - Comments:: Patient denied any hearing difficulty.   No hearing aids.  Vision Screening - Comments:: Patient wears corrective glasses/contacts.  Eye exam done annually by: [facility].  Dietary issues and exercise activities discussed: Current Exercise Habits: Home exercise routine, Type of exercise: walking, Time (Minutes): 30, Frequency (Times/Week): 5, Weekly Exercise (Minutes/Week): 150, Intensity: Moderate, Exercise limited by: cardiac condition(s);orthopedic condition(s)   Goals Addressed             This Visit's Progress    Patient Stated       To maintain my current health status by continuing to eat healthy, stay physically active and socially active.      Depression Screen PHQ 2/9 Scores 02/23/2021 02/11/2021 02/11/2020 12/01/2018 10/11/2017 03/03/2017 11/09/2016  PHQ - 2 Score 0 0 0 0 0 0 0  PHQ- 9 Score - 3 - - - - -    Fall Risk Fall Risk  02/23/2021 02/11/2021 12/22/2018 12/01/2018 10/11/2017  Falls in the past year? 0 0 0 0 No  Comment - - Emmi Telephone Survey: data to providers prior to load - -  Number falls in past yr: 0 0 - 0 -  Injury with  Fall? 0 0 - - -  Risk for fall due to : No Fall Risks No Fall Risks - - -  Follow up Falls evaluation completed Falls evaluation completed - - -    FALL RISK PREVENTION PERTAINING TO THE HOME:  Any stairs in or around the home? Yes  If so, are there any without handrails? No  Home free of loose throw rugs in walkways, pet beds, electrical cords, etc? Yes  Adequate lighting in your home to reduce risk of falls? Yes   ASSISTIVE DEVICES UTILIZED TO PREVENT FALLS:  Life alert? No  Use of a cane, walker or w/c? No  Grab bars in the bathroom? No  Shower chair or bench in shower? No  Elevated toilet seat or a handicapped toilet? No   TIMED UP AND GO:  Was the test performed? No .  Length of time to ambulate 10 feet: n/a sec.   Gait steady and fast without use of assistive device  Cognitive Function:Normal cognitive status assessed by direct observation by this Nurse Health Advisor. No abnormalities found.   MMSE - Mini Mental State Exam 10/11/2017  Orientation to time 5  Orientation to Place 5  Registration 3  Attention/ Calculation 5  Recall 3  Language- name 2 objects 2  Language- repeat 1  Language- follow 3 step command 3  Language- read & follow direction 1  Write a sentence 1  Copy design 1  Total score 30        Immunizations Immunization History  Administered Date(s) Administered   Fluad Quad(high Dose 65+) 01/17/2019, 02/11/2020, 02/11/2021   Hepatitis A 10/15/1997, 12/04/1997   Hepatitis B 09/09/2008, 10/15/2008, 08/01/2017   Influenza Split 03/16/2011, 03/03/2012   Influenza, High Dose Seasonal PF 02/09/2016, 03/03/2017, 02/23/2018   Influenza,inj,Quad PF,6+ Mos 03/08/2014, 02/27/2015   Influenza-Unspecified 03/07/2013   Meningococcal Conjugate 09/09/2008   PFIZER(Purple Top)SARS-COV-2 Vaccination 06/18/2019, 07/09/2019, 02/29/2020, 08/29/2020   Pneumococcal Conjugate-13 11/20/2014   Pneumococcal Polysaccharide-23 06/07/2013   Tdap 05/24/2008, 08/01/2017    Typhoid Inactivated 09/04/1997, 07/30/2004, 08/01/2017   Yellow Fever 09/09/2008   Zoster Recombinat (Shingrix) 09/21/2016, 03/03/2017   Zoster, Live 03/18/2011    TDAP status: Up to date  Flu Vaccine status: Up to date  Pneumococcal vaccine status: Up to date  Covid-19 vaccine status: Completed vaccines  Qualifies for Shingles Vaccine? Yes   Zostavax completed Yes   Shingrix Completed?: Yes  Screening Tests Health Maintenance  Topic Date Due   COLONOSCOPY (Pts 45-52yrs Insurance coverage will need to be confirmed)  10/20/2020   TETANUS/TDAP  08/02/2027   INFLUENZA VACCINE  Completed   COVID-19 Vaccine  Completed   Hepatitis C Screening  Completed   Zoster Vaccines- Shingrix  Completed   HPV VACCINES  Aged Out    Health Maintenance  Health Maintenance Due  Topic Date Due   COLONOSCOPY (Pts 45-82yrs Insurance coverage will need to be confirmed)  10/20/2020    Colorectal cancer screening: Type of screening: Colonoscopy. Completed 10/20/2017. Repeat every 3 years (scheduled for 04/22/2021 with Dr. Fuller Plan)  Lung Cancer Screening: (Low Dose CT Chest recommended if Age 76-80 years, 30 pack-year currently smoking OR have quit w/in 15years.) does not qualify.   Lung Cancer Screening Referral: no  Additional Screening:  Hepatitis C Screening: does qualify; Completed yes  Vision Screening: Recommended annual ophthalmology exams for early detection of glaucoma and other disorders of the eye. Is the patient up to date with their annual eye exam?  Yes  Who is the provider or what is the name of  the office in which the patient attends annual eye exams? [facility] If pt is not established with a provider, would they like to be referred to a provider to establish care? No .   Dental Screening: Recommended annual dental exams for proper oral hygiene  Community Resource Referral / Chronic Care Management: CRR required this visit?  No   CCM required this visit?  No       Plan:     I have personally reviewed and noted the following in the patient's chart:   Medical and social history Use of alcohol, tobacco or illicit drugs  Current medications and supplements including opioid prescriptions. Patient is not currently taking opioid prescriptions. Functional ability and status Nutritional status Physical activity Advanced directives List of other physicians Hospitalizations, surgeries, and ER visits in previous 12 months Vitals Screenings to include cognitive, depression, and falls Referrals and appointments  In addition, I have reviewed and discussed with patient certain preventive protocols, quality metrics, and best practice recommendations. A written personalized care plan for preventive services as well as general preventive health recommendations were provided to patient.     Sheral Flow, LPN   23/11/6281   Nurse Notes:

## 2021-02-23 NOTE — Patient Instructions (Signed)
Jeremy Waters , Thank you for taking time to come for your Medicare Wellness Visit. I appreciate your ongoing commitment to your health goals. Please review the following plan we discussed and let me know if I can assist you in the future.   Screening recommendations/referrals: Colonoscopy: scheduled for 04/22/2021 Recommended yearly ophthalmology/optometry visit for glaucoma screening and checkup Recommended yearly dental visit for hygiene and checkup  Vaccinations: Influenza vaccine: 02/11/2021 Pneumococcal vaccine: 06/07/2013, 11/20/2014 Tdap vaccine: 08/01/2017; due every 10 years Shingles vaccine: 09/21/2016, 03/03/2017   Covid-19: 06/18/2019, 07/09/2019, 02/29/2020, 08/29/2020  Advanced directives: Please bring a copy of your health care power of attorney and living will to the office at your convenience.  Conditions/risks identified: Yes; Client understands the importance of follow-up with providers by attending scheduled visits and discussed goals to eat healthier, increase physical activity, exercise the brain, socialize more, get enough sleep and make time for laughter.  Next appointment: Please schedule your next Medicare Wellness Visit with your Nurse Health Advisor in 1 year by calling [phone].  Preventive Care 74 Years and Older, Male Preventive care refers to lifestyle choices and visits with your health care provider that can promote health and wellness. What does preventive care include? A yearly physical exam. This is also called an annual well check. Dental exams once or twice a year. Routine eye exams. Ask your health care provider how often you should have your eyes checked. Personal lifestyle choices, including: Daily care of your teeth and gums. Regular physical activity. Eating a healthy diet. Avoiding tobacco and drug use. Limiting alcohol use. Practicing safe sex. Taking low doses of aspirin every day. Taking vitamin and mineral supplements as recommended by your  health care provider. What happens during an annual well check? The services and screenings done by your health care provider during your annual well check will depend on your age, overall health, lifestyle risk factors, and family history of disease. Counseling  Your health care provider may ask you questions about your: Alcohol use. Tobacco use. Drug use. Emotional well-being. Home and relationship well-being. Sexual activity. Eating habits. History of falls. Memory and ability to understand (cognition). Work and work Statistician. Screening  You may have the following tests or measurements: Height, weight, and BMI. Blood pressure. Lipid and cholesterol levels. These may be checked every 5 years, or more frequently if you are over 40 years old. Skin check. Lung cancer screening. You may have this screening every year starting at age 47 if you have a 30-pack-year history of smoking and currently smoke or have quit within the past 15 years. Fecal occult blood test (FOBT) of the stool. You may have this test every year starting at age 69. Flexible sigmoidoscopy or colonoscopy. You may have a sigmoidoscopy every 5 years or a colonoscopy every 10 years starting at age 40. Prostate cancer screening. Recommendations will vary depending on your family history and other risks. Hepatitis C blood test. Hepatitis B blood test. Sexually transmitted disease (STD) testing. Diabetes screening. This is done by checking your blood sugar (glucose) after you have not eaten for a while (fasting). You may have this done every 1-3 years. Abdominal aortic aneurysm (AAA) screening. You may need this if you are a current or former smoker. Osteoporosis. You may be screened starting at age 39 if you are at high risk. Talk with your health care provider about your test results, treatment options, and if necessary, the need for more tests. Vaccines  Your health care provider may recommend certain  vaccines, such  as: Influenza vaccine. This is recommended every year. Tetanus, diphtheria, and acellular pertussis (Tdap, Td) vaccine. You may need a Td booster every 10 years. Zoster vaccine. You may need this after age 67. Pneumococcal 13-valent conjugate (PCV13) vaccine. One dose is recommended after age 82. Pneumococcal polysaccharide (PPSV23) vaccine. One dose is recommended after age 14. Talk to your health care provider about which screenings and vaccines you need and how often you need them. This information is not intended to replace advice given to you by your health care provider. Make sure you discuss any questions you have with your health care provider. Document Released: 06/06/2015 Document Revised: 01/28/2016 Document Reviewed: 03/11/2015 Elsevier Interactive Patient Education  2017 [place] Prevention in the Home Falls can cause injuries. They can happen to people of all ages. There are many things you can do to make your home safe and to help prevent falls. What can I do on the outside of my home? Regularly fix the edges of walkways and driveways and fix any cracks. Remove anything that might make you trip as you walk through a door, such as a raised step or threshold. Trim any bushes or trees on the path to your home. Use bright outdoor lighting. Clear any walking paths of anything that might make someone trip, such as rocks or tools. Regularly check to see if handrails are loose or broken. Make sure that both sides of any steps have handrails. Any raised decks and porches should have guardrails on the edges. Have any leaves, snow, or ice cleared regularly. Use sand or salt on walking paths during winter. Clean up any spills in your garage right away. This includes oil or grease spills. What can I do in the bathroom? Use night lights. Install grab bars by the toilet and in the tub and shower. Do not use towel bars as grab bars. Use non-skid mats or decals in the tub or  shower. If you need to sit down in the shower, use a plastic, non-slip stool. Keep the floor dry. Clean up any water that spills on the floor as soon as it happens. Remove soap buildup in the tub or shower regularly. Attach bath mats securely with double-sided non-slip rug tape. Do not have throw rugs and other things on the floor that can make you trip. What can I do in the bedroom? Use night lights. Make sure that you have a light by your bed that is easy to reach. Do not use any sheets or blankets that are too big for your bed. They should not hang down onto the floor. Have a firm chair that has side arms. You can use this for support while you get dressed. Do not have throw rugs and other things on the floor that can make you trip. What can I do in the kitchen? Clean up any spills right away. Avoid walking on wet floors. Keep items that you use a lot in easy-to-reach places. If you need to reach something above you, use a strong step stool that has a grab bar. Keep electrical cords out of the way. Do not use floor polish or wax that makes floors slippery. If you must use wax, use non-skid floor wax. Do not have throw rugs and other things on the floor that can make you trip. What can I do with my stairs? Do not leave any items on the stairs. Make sure that there are handrails on both sides of the stairs  and use them. Fix handrails that are broken or loose. Make sure that handrails are as long as the stairways. Check any carpeting to make sure that it is firmly attached to the stairs. Fix any carpet that is loose or worn. Avoid having throw rugs at the top or bottom of the stairs. If you do have throw rugs, attach them to the floor with carpet tape. Make sure that you have a light switch at the top of the stairs and the bottom of the stairs. If you do not have them, ask someone to add them for you. What else can I do to help prevent falls? Wear shoes that: Do not have high heels. Have  rubber bottoms. Are comfortable and fit you well. Are closed at the toe. Do not wear sandals. If you use a stepladder: Make sure that it is fully opened. Do not climb a closed stepladder. Make sure that both sides of the stepladder are locked into place. Ask someone to hold it for you, if possible. Clearly mark and make sure that you can see: Any grab bars or handrails. First and last steps. Where the edge of each step is. Use tools that help you move around (mobility aids) if they are needed. These include: Canes. Walkers. Scooters. Crutches. Turn on the lights when you go into a dark area. Replace any light bulbs as soon as they burn out. Set up your furniture so you have a clear path. Avoid moving your furniture around. If any of your floors are uneven, fix them. If there are any pets around you, be aware of where they are. Review your medicines with your doctor. Some medicines can make you feel dizzy. This can increase your chance of falling. Ask your doctor what other things that you can do to help prevent falls. This information is not intended to replace advice given to you by your health care provider. Make sure you discuss any questions you have with your health care provider. Document Released: 03/06/2009 Document Revised: 10/16/2015 Document Reviewed: 06/14/2014 Elsevier Interactive Patient Education  2017 [employer].

## 2021-03-05 ENCOUNTER — Ambulatory Visit (HOSPITAL_COMMUNITY): Payer: PPO | Attending: Cardiology

## 2021-03-05 ENCOUNTER — Other Ambulatory Visit: Payer: Self-pay

## 2021-03-05 DIAGNOSIS — M545 Low back pain, unspecified: Secondary | ICD-10-CM | POA: Diagnosis not present

## 2021-03-05 DIAGNOSIS — R011 Cardiac murmur, unspecified: Secondary | ICD-10-CM

## 2021-03-05 LAB — ECHOCARDIOGRAM COMPLETE
AR max vel: 1.35 cm2
AV Area VTI: 1.63 cm2
AV Area mean vel: 1.47 cm2
AV Mean grad: 13 mmHg
AV Peak grad: 24.6 mmHg
Ao pk vel: 2.48 m/s
Area-P 1/2: 2.04 cm2
S' Lateral: 4 cm

## 2021-03-09 NOTE — Progress Notes (Signed)
Cardiology Office Note:    Date:  03/09/2021   ID:  Jeremy Waters, DOB [birth year], MRN [id number]  PCP:  Binnie Rail, MD   [facility] Providers Cardiologist:  None     Referring MD: Binnie Rail, MD   No chief complaint on file. Ventricular Ectopy  History of Present Illness:    Jeremy Waters is a 75 y.o. male with a hx of smoking, prostate CA and prostatectomy TURP (no radiation), referred to cardiology for PVCs, recent COVID + early September.  He saw his PCP and was noted to have PVCs and bigeminy. He is asymptomatic. He notes some heart burn. He does 15 minutes to the elliptical. He denies dyspnea on exertion. He symptoms have seconds worth of chest tension. He smoked up to [place], 20 years. He had a stress test a long time ago, it was normal. It was done on [facility]. 30 years ago possibly for life insurance. It was treadmill exercise. He denies angina, dyspnea on exertion, lower extremity edema, PND or orthopnea. He denies syncope. His COVID symptoms have resolved.  Family hx: Uncle that died of a stroke. No history of CVD.   EKG 02/11/2021 showed sinus rhythm with frequent PVCs with RBBB morphology c/f left sided origin  EKG 10/03/2015: Sinus bradycardia HR 59 bpm, no PVCs  Echocardiogram 03/05/2021: Normal LVEF, No RWMA. RV.No septal hypertrophy.Biatrial severe enlargement. Heavily calcified aortic valve. Mean gradient 13 mmHg  BL carotid US: 01/2019: no significant ICA stenosis  LDL 91 mg/dL  TSH 1.5  Past Medical History:  Diagnosis Date   Adenomatous colon polyp 09/2007   Arthritis    OA / PAIN LEFT KNEE   Cancer ([place])    Diverticulosis of colon    w/o hemorrage   Heart murmur    TOLD HE HAS A SLIGHT MURMUR   Hyperlipidemia    Inguinal hernia    left side    Thyroid disease     Past Surgical History:  Procedure Laterality Date   BACK SURGERY  05/12/2005   L5   COLONOSCOPY  1999   Negative; Dr Fuller Plan   COLONOSCOPY  2004   tics, hemorrhoids    COLONOSCOPY  2009   polyps ([place]   [place]  10/2003   KNEE SURGERY  1969   Patellar fracture fragment Lt   LAMINECTOMY  2000   L4-5   LUMBAR LAMINECTOMY/DECOMPRESSION MICRODISCECTOMY Right 10/08/2015   Procedure: MICRO LUMBAR DECOMPRESSION L4-L5 AND L5-S1 ON RIGHT ;  Surgeon: Susa Day, MD;  Location: [facility];  Service: Orthopedics;  Laterality: Right;   MASS EXCISION  04/26/2011   Procedure: MINOR EXCISION OF MASS;  Surgeon: Earnstine Regal, MD;  Location: [place];  Service: General;  Laterality: Right;  Excise subcutaneous mass right axilla Minor Room   PROSTATECTOMY  04/16/2005   TONSILLECTOMY     TOTAL KNEE ARTHROPLASTY Left 07/03/2013   Procedure: LEFT TOTAL KNEE ARTHROPLASTY;  Surgeon: Mauri Pole, MD;  Location: [facility];  Service: Orthopedics;  Laterality: Left;    Current Medications: No outpatient medications have been marked as taking for the 03/10/21 encounter (Appointment) with Janina Mayo, MD.     Allergies:   Atorvastatin   Social History   Socioeconomic History   Marital status: Married    Spouse name: Not on file   Number of children: 2   Years of education: Not on file   Highest education level:  Not on file  Occupational History   Occupation: insur exe    Employer: [employer]  Tobacco Use   Smoking status: Former   Smokeless tobacco: Never   Tobacco comments:    stopped smoking cigarettes 1983, 1 cigar /day 2001-2013  Vaping Use   Vaping Use: Never used  Substance and Sexual Activity   Alcohol use: Yes    Alcohol/week: 7.0 - 14.0 standard drinks    Types: 7 - 14 Standard drinks or equivalent per week    Comment: scotch or wine daily   Drug use: No   Sexual activity: Yes  Other Topics Concern   Not on file  Social History Narrative   Exercises daily            Social Determinants of Health   Financial Resource Strain: Low Risk    Difficulty of Paying Living Expenses: Not hard at  all  Food Insecurity: No Food Insecurity   Worried About Charity fundraiser in the Last Year: Never true   [place] in the Last Year: Never true  Transportation Needs: No Transportation Needs   Lack of Transportation (Medical): No   Lack of Transportation (Non-Medical): No  Physical Activity: Sufficiently Active   Days of Exercise per Week: 5 days   Minutes of Exercise per Session: 30 min  Stress: No Stress Concern Present   Feeling of Stress : Not at all  Social Connections: Socially Integrated   Frequency of Communication with Friends and Family: More than three times a week   Frequency of Social Gatherings with Friends and Family: Three times a week   Attends Religious Services: 1 to 4 times per year   Active Member of Clubs or Organizations: Yes   Attends Archivist Meetings: 1 to 4 times per year   Marital Status: Married     Family History: The patient's family history includes Alcohol abuse in his paternal uncle; COPD in his mother; Cancer in his father; Colon cancer (age of onset: 9) in his father; Hypertension in his paternal uncle; Lung cancer in his mother and sister; Prostate cancer in his father; Stroke (age of onset: 30) in his paternal uncle; Urolithiasis in his father. There is no history of Asthma, Heart disease, Esophageal cancer, Stomach cancer, or Liver disease.  ROS:   Please see the history of present illness.     All other systems reviewed and are negative.  EKGs/Labs/Other Studies Reviewed:    The following studies were reviewed today:   EKG:  EKG is  ordered today.  The ekg ordered today demonstrates   Sinus bradycardia, no PVCs  Recent Labs: 02/04/2021: ALT 22; BUN 10; Creatinine, Ser 0.79; Hemoglobin 13.9; Platelets 223.0; Potassium 4.4; Sodium 138; TSH 1.52  Recent Lipid Panel    Component Value Date/Time   CHOL 175 02/04/2021 0817   CHOL 224 (H) 09/02/2014 0919   TRIG 77.0 02/04/2021 0817   TRIG 116 09/02/2014 0919   HDL  68.30 02/04/2021 0817   HDL 76 09/02/2014 0919   CHOLHDL 3 02/04/2021 0817   VLDL 15.4 02/04/2021 0817   LDLCALC 91 02/04/2021 0817   LDLCALC 125 (H) 09/02/2014 0919   LDLDIRECT 179.6 01/04/2013 0740     Risk Assessment/Calculations:     The 10-year ASCVD risk score (Arnett DK, et al., 2019) is: 21.3%   Values used to calculate the score:     Age: 79 years     Sex: Male  Is Non-Hispanic African American: No     Diabetic: No     Tobacco smoker: No     Systolic Blood Pressure: 938 mmHg     Is BP treated: No     HDL Cholesterol: 68.3 mg/dL     Total Cholesterol: 175 mg/dL       Physical Exam:    VS:  There were no vitals taken for this visit.    Wt Readings from Last 3 Encounters:  02/11/21 148 lb (67.1 kg)  04/11/20 150 lb (68 kg)  02/11/20 152 lb 3.2 oz (69 kg)     GEN:  Well nourished, well developed in no acute distress HEENT: Normal NECK: No JVD; No carotid bruits CARDIAC: RRR, + SEM RUSB, no rubs, gallops RESPIRATORY:  Clear to auscultation without rales, wheezing or rhonchi  ABDOMEN: Soft, non-tender, non-distended MUSCULOSKELETAL:  No edema; No deformity  SKIN: Warm and dry NEUROLOGIC:  Alert and oriented x 3 PSYCHIATRIC:  Normal affect   ASSESSMENT:   #Frequent Ventricular Ectopy: patient has risk factors for CAD, echo was unremarkable. Low concern for infiltrative disease. Had a recent COVID infection which may contribute with possible myocarditis. Considering he is completely asymptomatic and PVCs have resolved possible myocarditis seems likely. We discussed that if he develops progressive chest pain or dyspnea to seek medical attention. If PVCs recur without anginal symptoms can monitor with a ziopatch and consider further imaging like cardiac MR. PLAN:    In order of problems listed above:  Follow up PRN      Medication Adjustments/Labs and Tests Ordered: Current medicines are reviewed at length with the patient today.  Concerns regarding   Signed, Janina Mayo, MD  03/09/2021 7:24 PM    [place]

## 2021-03-10 ENCOUNTER — Other Ambulatory Visit: Payer: Self-pay

## 2021-03-10 ENCOUNTER — Encounter: Payer: Self-pay | Admitting: Internal Medicine

## 2021-03-10 ENCOUNTER — Ambulatory Visit: Payer: PPO | Admitting: Internal Medicine

## 2021-03-10 VITALS — BP 133/74 | HR 53 | Ht 67.0 in | Wt 155.0 lb

## 2021-03-10 DIAGNOSIS — I493 Ventricular premature depolarization: Secondary | ICD-10-CM

## 2021-03-10 NOTE — Patient Instructions (Signed)
Medication Instructions:  Your Physician recommend you continue on your current medication as directed.    *If you need a refill on your cardiac medications before your next appointment, please call your pharmacy*   Lab Work: None ordered today   Testing/Procedures: None ordered today   Follow-Up: At [facility], you and your health needs are our priority.  As part of our continuing mission to provide you with exceptional heart care, we have created designated Provider Care Teams.  These Care Teams include your primary Cardiologist (physician) and Advanced Practice Providers (APPs -  Physician Assistants and Nurse Practitioners) who all work together to provide you with the care you need, when you need it.  We recommend signing up for the patient portal called "MyChart".  Sign up information is provided on this After Visit Summary.  MyChart is used to connect with patients for Virtual Visits (Telemedicine).  Patients are able to view lab/test results, encounter notes, upcoming appointments, etc.  Non-urgent messages can be sent to your provider as well.   To learn more about what you can do with MyChart, go to [email or web address].    Your next appointment:   As needed  The format for your next appointment:   In Person  Provider:   Phineas Inches, MD

## 2021-03-23 DIAGNOSIS — M545 Low back pain, unspecified: Secondary | ICD-10-CM | POA: Diagnosis not present

## 2021-04-08 ENCOUNTER — Other Ambulatory Visit: Payer: Self-pay

## 2021-04-08 ENCOUNTER — Ambulatory Visit (AMBULATORY_SURGERY_CENTER): Payer: Self-pay | Admitting: *Deleted

## 2021-04-08 ENCOUNTER — Encounter: Payer: Self-pay | Admitting: Gastroenterology

## 2021-04-08 VITALS — Ht 67.0 in | Wt 155.0 lb

## 2021-04-08 DIAGNOSIS — Z800 Family history of malignant neoplasm of digestive organs: Secondary | ICD-10-CM

## 2021-04-08 DIAGNOSIS — Z86010 Personal history of colonic polyps: Secondary | ICD-10-CM

## 2021-04-08 MED ORDER — NA SULFATE-K SULFATE-MG SULF 17.5-3.13-1.6 GM/177ML PO SOLN: 1.0000 | Freq: Once | ORAL | 0 refills | Status: AC

## 2021-04-08 NOTE — Progress Notes (Signed)
No egg or soy allergy known to patient  No issues known to pt with past sedation with any surgeries or procedures Patient denies ever being told they had issues or difficulty with intubation  No FH of Malignant Hyperthermia Pt is not on diet pills Pt is not on  home 02  Pt is not on blood thinners  Pt denies issues with constipation  No A fib or A flutter  Pt is fully vaccinated  for Covid     NO PA's for preps discussed with pt In PV today  Discussed with pt there will be an out-of-pocket cost for prep and that varies from $0 to 70 +  dollars - pt verbalized understanding   Due to the COVID-19 pandemic we are asking patients to follow certain guidelines in PV and the [place]   Pt aware of COVID protocols and [facility] guidelines

## 2021-04-09 DIAGNOSIS — M545 Low back pain, unspecified: Secondary | ICD-10-CM | POA: Diagnosis not present

## 2021-04-22 ENCOUNTER — Ambulatory Visit (AMBULATORY_SURGERY_CENTER): Payer: PPO | Admitting: Gastroenterology

## 2021-04-22 ENCOUNTER — Encounter: Payer: Self-pay | Admitting: Gastroenterology

## 2021-04-22 ENCOUNTER — Other Ambulatory Visit: Payer: Self-pay

## 2021-04-22 VITALS — BP 142/72 | HR 54 | Temp 97.5°F | Resp 11 | Ht 67.0 in | Wt 155.0 lb

## 2021-04-22 DIAGNOSIS — D123 Benign neoplasm of transverse colon: Secondary | ICD-10-CM | POA: Diagnosis not present

## 2021-04-22 DIAGNOSIS — Z86010 Personal history of colonic polyps: Secondary | ICD-10-CM | POA: Diagnosis not present

## 2021-04-22 DIAGNOSIS — Z800 Family history of malignant neoplasm of digestive organs: Secondary | ICD-10-CM

## 2021-04-22 DIAGNOSIS — E785 Hyperlipidemia, unspecified: Secondary | ICD-10-CM | POA: Diagnosis not present

## 2021-04-22 MED ORDER — SODIUM CHLORIDE 0.9 % IV SOLN: 500.0000 mL | Freq: Once | INTRAVENOUS | Status: DC

## 2021-04-22 NOTE — Progress Notes (Signed)
VS taken by [place] 

## 2021-04-22 NOTE — Patient Instructions (Signed)

## 2021-04-22 NOTE — Op Note (Signed)
[place] Patient Name: Jeremy Waters Procedure Date: 04/22/2021 8:04 AM MRN: [id number] Endoscopist: Ladene Artist , MD Age: 75 Referring MD:  Date of Birth: May 24, 1946 Gender: Male Account #: [id number] Procedure:                Colonoscopy Indications:              Surveillance: Personal history of adenomatous                            polyps on last colonoscopy 3 years ago. Family                            history of colon cancer, first degree relative. Medicines:                Monitored Anesthesia Care Procedure:                Pre-Anesthesia Assessment:                           - Prior to the procedure, a History and Physical                            was performed, and patient medications and                            allergies were reviewed. The patient's tolerance of                            previous anesthesia was also reviewed. The risks                            and benefits of the procedure and the sedation                            options and risks were discussed with the patient.                            All questions were answered, and informed consent                            was obtained. Prior Anticoagulants: The patient has                            taken no previous anticoagulant or antiplatelet                            agents. ASA Grade Assessment: III - A patient with                            severe systemic disease. After reviewing the risks                            and benefits, the patient was deemed in  satisfactory condition to undergo the procedure.                           After obtaining informed consent, the colonoscope                            was passed under direct vision. Throughout the                            procedure, the patient's blood pressure, pulse, and                            oxygen saturations were monitored continuously. The                            Olympus  PCF-H190DL ([id number]) Colonoscope was                            introduced through the anus and advanced to the the                            cecum, identified by appendiceal orifice and                            ileocecal valve. The ileocecal valve, appendiceal                            orifice, and rectum were photographed. The quality                            of the bowel preparation was good. The colonoscopy                            was somewhat difficult due to a redundant colon,                            significant looping and a tortuous colon. The                            patient tolerated the procedure well. Scope In: 8:07:27 AM Scope Out: 8:30:08 AM Scope Withdrawal Time: 0 hours 15 minutes 7 seconds  Total Procedure Duration: 0 hours 22 minutes 41 seconds  Findings:                 The perianal and digital rectal examinations were                            normal.                           A 12 mm polyp was found in the mid transverse                            colon. The polyp was sessile. The polyp was removed  with a hot snare. Resection and retrieval were                            complete.                           Two sessile polyps were found in the transverse                            colon. The polyps were 6 to 7 mm in size. These                            polyps were removed with a cold snare. Resection                            and retrieval were complete.                           A few small-mouthed diverticula were found in the                            left colon. There was no evidence of diverticular                            bleeding.                           Internal hemorrhoids were found during                            retroflexion. The hemorrhoids were small and Grade                            I (internal hemorrhoids that do not prolapse).                           The exam was otherwise without abnormality  on                            direct and retroflexion views. Complications:            No immediate complications. Estimated blood loss:                            None. Estimated Blood Loss:     Estimated blood loss: none. Impression:               - One 12 mm polyp in the mid transverse colon,                            removed with a hot snare. Resected and retrieved.                           - Two 6 to 7 mm polyps in the transverse colon,  removed with a cold snare. Resected and retrieved.                           - Mild diverticulosis in the left colon.                           - Internal hemorrhoids.                           - The examination was otherwise normal on direct                            and retroflexion views. Recommendation:           - Repeat colonoscopy after studies are complete for                            surveillance based on pathology results.                           - Patient has a contact number available for                            emergencies. The signs and symptoms of potential                            delayed complications were discussed with the                            patient. Return to normal activities tomorrow.                            Written discharge instructions were provided to the                            patient.                           - Resume previous diet.                           - Continue present medications.                           - Await pathology results.                           - No aspirin, ibuprofen, naproxen, or other                            non-steroidal anti-inflammatory drugs for 2 weeks                            after polyp removal. Ladene Artist, MD 04/22/2021 8:35:15 AM This report has been signed electronically.

## 2021-04-22 NOTE — Progress Notes (Signed)
Called to room to assist during endoscopic procedure.  Patient ID and intended procedure confirmed with present staff. Received instructions for my participation in the procedure from the performing physician.  

## 2021-04-22 NOTE — Progress Notes (Signed)
Report to PACU, RN, vss, BBS= Clear.  

## 2021-04-22 NOTE — Progress Notes (Signed)
Pt's states no medical or surgical changes since previsit or office visit. 

## 2021-04-22 NOTE — Progress Notes (Signed)
History & Physical  Primary Care Physician:  Binnie Rail, MD Primary Gastroenterologist: Lucio Edward, MD  CHIEF COMPLAINT:  Personal history of colon polyps   HPI: Jeremy Waters is a 75 y.o. male with a personal history of 5 adenomatous polyps on last colonoscopy in 2019.  Family history of colon cancer, first-degree relative.  For colonoscopy today.   Past Medical History:  Diagnosis Date   Adenomatous colon polyp 09/2007   Arthritis    OA / PAIN LEFT KNEE   Cancer ([place])    prostate cancer   Cataract    removed both eyes   COVID-19 virus infection    Diverticulosis of colon    w/o hemorrage   Heart murmur    TOLD HE HAS A SLIGHT MURMUR   Hyperlipidemia    Inguinal hernia    left side    Thyroid disease     Past Surgical History:  Procedure Laterality Date   BACK SURGERY  05/12/2005   L5   COLONOSCOPY  1999   Negative; Dr Fuller Plan   COLONOSCOPY  2004   tics, hemorrhoids   COLONOSCOPY  2009   polyps ([place]   [place]  10/2003   KNEE SURGERY  1969   Patellar fracture fragment Lt   LAMINECTOMY  2000   L4-5   LUMBAR LAMINECTOMY/DECOMPRESSION MICRODISCECTOMY Right 10/08/2015   Procedure: MICRO LUMBAR DECOMPRESSION L4-L5 AND L5-S1 ON RIGHT ;  Surgeon: Susa Day, MD;  Location: [facility];  Service: Orthopedics;  Laterality: Right;   MASS EXCISION  04/26/2011   Procedure: MINOR EXCISION OF MASS;  Surgeon: Earnstine Regal, MD;  Location: [facility];  Service: General;  Laterality: Right;  Excise subcutaneous mass right axilla Minor Room   POLYPECTOMY     PROSTATECTOMY  04/16/2005   TONSILLECTOMY     TOTAL KNEE ARTHROPLASTY Left 07/03/2013   Procedure: LEFT TOTAL KNEE ARTHROPLASTY;  Surgeon: Mauri Pole, MD;  Location: [facility];  Service: Orthopedics;  Laterality: Left;    Prior to Admission medications   Medication Sig Start Date End Date Taking? Authorizing Provider  beta carotene 25000 UNIT capsule Take 25,000 Units  by mouth daily.   Yes [provider]  fluticasone (FLONASE) 50 MCG/ACT nasal spray Instill one spray into both nostrils daily 06/06/20  Yes Burns, Claudina Lick, MD  glycopyrrolate (ROBINUL) 2 MG tablet Take 1 tablet (2 mg total) by mouth in the morning. 11/03/20  Yes Esterwood, Amy S, PA-C  levothyroxine (SYNTHROID) 50 MCG tablet Take 1 tablet (50 mcg total) by mouth daily. 11/19/20  Yes Burns, Claudina Lick, MD  pyridOXINE (VITAMIN B-6) 100 MG tablet Take 100 mg by mouth daily.   Yes [provider]  simvastatin (ZOCOR) 40 MG tablet Take 1 tablet (40 mg total) by mouth at bedtime. 11/19/20  Yes Burns, Claudina Lick, MD  vitamin C (ASCORBIC ACID) 500 MG tablet Take 500 mg by mouth 2 (two) times daily.   Yes [provider]  vitamin E 400 UNIT capsule Take 400 Units by mouth daily.   Yes [provider]  clonazePAM (KLONOPIN) 0.5 MG tablet Take 1 tablet (0.5 mg total) by mouth at bedtime as needed. 01/23/20   Binnie Rail, MD  ibuprofen (ADVIL) 200 MG tablet Take 200 mg by mouth every 6 (six) hours as needed.    [provider]    Current Outpatient Medications  Medication Sig Dispense Refill   beta  carotene 25000 UNIT capsule Take 25,000 Units by mouth daily.     fluticasone (FLONASE) 50 MCG/ACT nasal spray Instill one spray into both nostrils daily 48 g 1   glycopyrrolate (ROBINUL) 2 MG tablet Take 1 tablet (2 mg total) by mouth in the morning. 90 tablet 1   levothyroxine (SYNTHROID) 50 MCG tablet Take 1 tablet (50 mcg total) by mouth daily. 90 tablet 3   pyridOXINE (VITAMIN B-6) 100 MG tablet Take 100 mg by mouth daily.     simvastatin (ZOCOR) 40 MG tablet Take 1 tablet (40 mg total) by mouth at bedtime. 90 tablet 3   vitamin C (ASCORBIC ACID) 500 MG tablet Take 500 mg by mouth 2 (two) times daily.     vitamin E 400 UNIT capsule Take 400 Units by mouth daily.     clonazePAM (KLONOPIN) 0.5 MG tablet Take 1 tablet (0.5 mg total) by mouth at bedtime as needed. 30 tablet  0   ibuprofen (ADVIL) 200 MG tablet Take 200 mg by mouth every 6 (six) hours as needed.     Current Facility-Administered Medications  Medication Dose Route Frequency Provider Last Rate Last Admin   0.9 %  sodium chloride infusion  500 mL Intravenous Once Ladene Artist, MD        Allergies as of 04/22/2021 - Review Complete 04/22/2021  Allergen Reaction Noted   Atorvastatin Other (See Comments)     Family History  Problem Relation Age of Onset   Lung cancer Mother    COPD Mother    Cancer Father        Bladder Cancer   Prostate cancer Father    Colon cancer Father 76   Urolithiasis Father    Lung cancer Sister    Alcohol abuse Paternal Uncle    Stroke Paternal Uncle 46   Hypertension Paternal Uncle    Asthma Neg Hx    Heart disease Neg Hx    Esophageal cancer Neg Hx    Stomach cancer Neg Hx    Liver disease Neg Hx    Colon polyps Neg Hx    Rectal cancer Neg Hx     Social History   Socioeconomic History   Marital status: Married    Spouse name: Not on file   Number of children: 2   Years of education: Not on file   Highest education level: Not on file  Occupational History   Occupation: insur exe    Employer: [employer]  Tobacco Use   Smoking status: Former   Smokeless tobacco: Never   Tobacco comments:    stopped smoking cigarettes 1983, 1 cigar /day 2001-2013  Vaping Use   Vaping Use: Never used  Substance and Sexual Activity   Alcohol use: Yes    Alcohol/week: 7.0 - 14.0 standard drinks    Types: 7 - 14 Standard drinks or equivalent per week    Comment: scotch or wine daily   Drug use: No   Sexual activity: Yes  Other Topics Concern   Not on file  Social History Narrative   Exercises daily            Social Determinants of Health   Financial Resource Strain: Low Risk    Difficulty of Paying Living Expenses: Not hard at all  Food Insecurity: No Food Insecurity   Worried About Charity fundraiser in the Last Year: Never true    Ran Out of Food in the Last Year: Never true  Transportation Needs:  No Transportation Needs   Lack of Transportation (Medical): No   Lack of Transportation (Non-Medical): No  Physical Activity: Sufficiently Active   Days of Exercise per Week: 5 days   Minutes of Exercise per Session: 30 min  Stress: No Stress Concern Present   Feeling of Stress : Not at all  Social Connections: Socially Integrated   Frequency of Communication with Friends and Family: More than three times a week   Frequency of Social Gatherings with Friends and Family: Three times a week   Attends Religious Services: 1 to 4 times per year   Active Member of Clubs or Organizations: Yes   Attends Archivist Meetings: 1 to 4 times per year   Marital Status: Married  Human resources officer Violence: Not At Risk   Fear of Current or Ex-Partner: No   Emotionally Abused: No   Physically Abused: No   Sexually Abused: No    Review of Systems:  All systems reviewed an negative except where noted in HPI.  Gen: Denies any fever, chills, sweats, anorexia, fatigue, weakness, malaise, weight loss, and sleep disorder CV: Denies chest pain, angina, palpitations, syncope, orthopnea, PND, peripheral edema, and claudication. Resp: Denies dyspnea at rest, dyspnea with exercise, cough, sputum, wheezing, coughing up blood, and pleurisy. GI: Denies vomiting blood, jaundice, and fecal incontinence.   Denies dysphagia or odynophagia. GU : Denies urinary burning, blood in urine, urinary frequency, urinary hesitancy, nocturnal urination, and urinary incontinence. MS: Denies joint pain, limitation of movement, and swelling, stiffness, low back pain, extremity pain. Denies muscle weakness, cramps, atrophy.  Derm: Denies rash, itching, dry skin, hives, moles, warts, or unhealing ulcers.  Psych: Denies depression, anxiety, memory loss, suicidal ideation, hallucinations, paranoia, and confusion. Heme: Denies bruising, bleeding, and enlarged  lymph nodes. Neuro:  Denies any headaches, dizziness, paresthesias. Endo:  Denies any problems with DM, thyroid, adrenal function.   Physical Exam: General:  Alert, well-developed, in NAD Head:  Normocephalic and atraumatic. Eyes:  Sclera clear, no icterus.   Conjunctiva pink. Ears:  Normal auditory acuity. Mouth:  No deformity or lesions.  Neck:  Supple; no masses . Lungs:  Clear throughout to auscultation.   No wheezes, crackles, or rhonchi. No acute distress. Heart:  Regular rate and rhythm; no murmurs. Abdomen:  Soft, nondistended, nontender. No masses, hepatomegaly. No obvious masses.  Normal bowel .    Rectal:  Deferred   Msk:  Symmetrical without gross deformities.. Pulses:  Normal pulses noted. Extremities:  Without edema. Neurologic:  Alert and  oriented x4;  grossly normal neurologically. Skin:  Intact without significant lesions or rashes. Cervical Nodes:  No significant cervical adenopathy. Psych:  Alert and cooperative. Normal mood and affect.   Impression / Plan:   Personal history of 5 adenomatous polyps on last colonoscopy.  Family history of colon cancer, first-degree relative.  For colonoscopy today.    Pricilla Riffle. Fuller Plan  04/22/2021, 8:02 AM See Shea Evans, [facility], to contact our on call provider

## 2021-04-23 ENCOUNTER — Telehealth: Payer: Self-pay | Admitting: *Deleted

## 2021-04-23 NOTE — Telephone Encounter (Signed)
  Follow up Call-  Call back number 04/22/2021  Post procedure Call Back phone  # [phone]  Permission to leave phone message Yes  Some recent data might be hidden     Patient questions: Message left to call if necessary.

## 2021-04-23 NOTE — Telephone Encounter (Signed)
  Follow up Call-  Call back number 04/22/2021  Post procedure Call Back phone  # [phone]  Permission to leave phone message Yes  Some recent data might be hidden     Patient questions:   Message not given VM not working.

## 2021-04-29 DIAGNOSIS — M545 Low back pain, unspecified: Secondary | ICD-10-CM | POA: Diagnosis not present

## 2021-05-12 DIAGNOSIS — M545 Low back pain, unspecified: Secondary | ICD-10-CM | POA: Diagnosis not present

## 2021-05-18 ENCOUNTER — Encounter: Payer: Self-pay | Admitting: Gastroenterology

## 2021-06-03 DIAGNOSIS — M545 Low back pain, unspecified: Secondary | ICD-10-CM | POA: Diagnosis not present

## 2021-06-17 DIAGNOSIS — M545 Low back pain, unspecified: Secondary | ICD-10-CM | POA: Diagnosis not present

## 2021-06-18 ENCOUNTER — Other Ambulatory Visit: Payer: Self-pay | Admitting: Physician Assistant

## 2021-06-30 ENCOUNTER — Telehealth: Payer: PPO

## 2021-07-06 DIAGNOSIS — M545 Low back pain, unspecified: Secondary | ICD-10-CM | POA: Diagnosis not present

## 2021-07-09 ENCOUNTER — Other Ambulatory Visit: Payer: Self-pay | Admitting: Internal Medicine

## 2021-07-27 DIAGNOSIS — M545 Low back pain, unspecified: Secondary | ICD-10-CM | POA: Diagnosis not present

## 2021-08-14 ENCOUNTER — Telehealth: Payer: Self-pay | Admitting: Physician Assistant

## 2021-08-14 MED ORDER — GLYCOPYRROLATE 2 MG PO TABS: 2.0000 mg | ORAL_TABLET | Freq: Every morning | ORAL | 1 refills | Status: DC

## 2021-08-14 NOTE — Telephone Encounter (Signed)
Called and spoke with patient to advise him that I have sent in his refill. I also let him know that I double checked and did not see a refill request. ?

## 2021-08-14 NOTE — Telephone Encounter (Signed)
Patient called stating that [facility] has been faxing [place] several requests to send Jeremy Waters script. Per pt, no more tablets left. Please advise.  ?

## 2021-08-17 DIAGNOSIS — M545 Low back pain, unspecified: Secondary | ICD-10-CM | POA: Diagnosis not present

## 2021-08-18 NOTE — Telephone Encounter (Signed)
Inbound call from patient. States he need PA for Robinul  ?

## 2021-08-18 NOTE — Telephone Encounter (Signed)
Prior Authorization has been started 

## 2021-08-19 NOTE — Telephone Encounter (Signed)
Patient has been notified

## 2021-08-19 NOTE — Telephone Encounter (Signed)
Approved on March 28 ?28-MAR-23:27-MAR-24 Glycopyrrolate '2MG'$  OR TABS Quantity:30; ?

## 2021-08-26 DIAGNOSIS — M25562 Pain in left knee: Secondary | ICD-10-CM | POA: Diagnosis not present

## 2021-08-26 DIAGNOSIS — M25571 Pain in right ankle and joints of right foot: Secondary | ICD-10-CM | POA: Diagnosis not present

## 2021-08-28 ENCOUNTER — Ambulatory Visit (INDEPENDENT_AMBULATORY_CARE_PROVIDER_SITE_OTHER): Payer: PPO

## 2021-08-28 DIAGNOSIS — E038 Other specified hypothyroidism: Secondary | ICD-10-CM

## 2021-08-28 DIAGNOSIS — E782 Mixed hyperlipidemia: Secondary | ICD-10-CM

## 2021-08-28 NOTE — Progress Notes (Signed)
? ?Chronic Care Management ?Pharmacy Note ? ?08/28/2021 ?Name:  Jeremy Waters MRN:  [id number] DOB:  [birth year] ? ?Summary: ?-Patient reports compliance to current medications, denies any issues or concerns with medications  ? ?Recommendations/Changes made from today's visit: ?-Recommending no changes to current medications, patient to reach out with any issues or concerns ? ?Plan: ?-F/u in 1 year  ? ?Subjective: ?Jeremy Waters is an 76 y.o. year old male who is a primary patient of Burns, Claudina Lick, MD.  The CCM team was consulted for assistance with disease management and care coordination needs.   ? ?Engaged with patient by telephone for follow up visit in response to provider referral for pharmacy case management and/or care coordination services.  ? ?Consent to Services:  ?The patient was given the following information about Chronic Care Management services today, agreed to services, and gave verbal consent: 1. CCM service includes personalized support from designated clinical staff supervised by the primary care provider, including individualized plan of care and coordination with other care providers 2. 24/7 contact phone numbers for assistance for urgent and routine care needs. 3. Service will only be billed when office clinical staff spend 20 minutes or more in a month to coordinate care. 4. Only one practitioner may furnish and bill the service in a calendar month. 5.The patient may stop CCM services at any time (effective at the end of the month) by phone call to the office staff. 6. The patient will be responsible for cost sharing (co-pay) of up to 20% of the service fee (after annual deductible is met). Patient agreed to services and consent obtained. ? ?Patient Care Team: ?Binnie Rail, MD as PCP - General (Internal Medicine) ?Newt Lukes, AUD (Audiology) ?Ladene Artist, MD as Consulting Physician (Gastroenterology) ?Calvert Cantor, MD as Consulting Physician (Ophthalmology) ?Tomasa Blase,  [facility] (Pharmacist) ? ?Recent office visits: ?02/11/2021 - Dr. Quay Burow - referred to cardiology due to frequent PVCs - ECHO ordered - no changes to medications - follow up in 1 year  ? ?Recent consult visits: ?03/10/2021 - Dr. Harl Bowie - Cardiology - no changes to medications - follow up as needed  ? ?Hospital visits: ?None in previous 6 months ? ?Objective: ? ?Lab Results  ?Component Value Date  ? CREATININE 0.79 02/04/2021  ? BUN 10 02/04/2021  ? GFR 87.86 02/04/2021  ? GFRNONAA >60 10/09/2015  ? GFRAA >60 10/09/2015  ? NA 138 02/04/2021  ? K 4.4 02/04/2021  ? CALCIUM 9.5 02/04/2021  ? CO2 27 02/04/2021  ? ? ?Lab Results  ?Component Value Date/Time  ? HGBA1C 5.5 02/04/2021 08:17 AM  ? HGBA1C 5.4 01/30/2020 08:01 AM  ? GFR 87.86 02/04/2021 08:17 AM  ? GFR 82.73 01/30/2020 08:01 AM  ?  ?Last diabetic Eye exam: No results found for: HMDIABEYEEXA  ?Last diabetic Foot exam: No results found for: HMDIABFOOTEX  ? ?Lab Results  ?Component Value Date  ? CHOL 175 02/04/2021  ? HDL 68.30 02/04/2021  ? Pala 91 02/04/2021  ? LDLDIRECT 179.6 01/04/2013  ? TRIG 77.0 02/04/2021  ? CHOLHDL 3 02/04/2021  ? ? ? ?  Latest Ref Rng & Units 02/04/2021  ?  8:17 AM 01/30/2020  ?  8:01 AM 01/16/2019  ?  3:48 PM  ?Hepatic Function  ?Total Protein 6.0 - 8.3 g/dL 7.1   7.8   7.5    ?Albumin 3.5 - 5.2 g/dL 4.1   4.7   4.7    ?AST 0 - 37 U/L 28  30   27    ?ALT 0 - 53 U/L 22   21   19     ?Alk Phosphatase 39 - 117 U/L 39   57   51    ?Total Bilirubin 0.2 - 1.2 mg/dL 0.7   0.8   0.7    ? ? ?Lab Results  ?Component Value Date/Time  ? TSH 1.52 02/04/2021 08:17 AM  ? TSH 1.64 05/02/2020 08:44 AM  ? FREET4 0.7 06/17/2008 12:00 AM  ? ? ? ?  Latest Ref Rng & Units 02/04/2021  ?  8:17 AM 01/30/2020  ?  8:01 AM 01/16/2019  ?  3:48 PM  ?CBC  ?WBC 4.0 - 10.5 K/uL 6.3   8.9   6.9    ?Hemoglobin 13.0 - 17.0 g/dL 13.9   14.9   14.2    ?Hematocrit 39.0 - 52.0 % 41.5   43.6   41.7    ?Platelets 150.0 - 400.0 K/uL 223.0   264.0   248.0    ? ? ?No results found for:  VD25OH ? ?Clinical ASCVD: Yes - carotid artery disease ?The 10-year ASCVD risk score (Arnett DK, et al., 2019) is: 23.6% ?  Values used to calculate the score: ?    Age: 7 years ?    Sex: Male ?    Is Non-Hispanic African American: No ?    Diabetic: No ?    Tobacco smoker: No ?    Systolic Blood Pressure: 542 mmHg ?    Is BP treated: No ?    HDL Cholesterol: 68.3 mg/dL ?    Total Cholesterol: 175 mg/dL   ? ? ?  02/23/2021  ?  3:57 PM 02/11/2021  ?  7:44 PM 02/11/2020  ?  1:50 PM  ?Depression screen PHQ 2/9  ?Decreased Interest 0 0 0  ?Down, Depressed, Hopeless 0 0 0  ?PHQ - 2 Score 0 0 0  ?Altered sleeping  2   ?Tired, decreased energy  1   ?Change in appetite  0   ?Feeling bad or failure about yourself   0   ?Trouble concentrating  0   ?Moving slowly or fidgety/restless  0   ?Suicidal thoughts  0   ?PHQ-9 Score  3   ?Difficult doing work/chores  Not difficult at all   ? ? ?Social History  ? ?Tobacco Use  ?Smoking Status Former  ?Smokeless Tobacco Never  ?Tobacco Comments  ? stopped smoking cigarettes 1983, 1 cigar /day 2001-2013  ? ?BP Readings from Last 3 Encounters:  ?04/22/21 (!) 142/72  ?03/10/21 133/74  ?02/11/21 108/70  ? ?Pulse Readings from Last 3 Encounters:  ?04/22/21 (!) 54  ?03/10/21 (!) 53  ?02/11/21 60  ? ?Wt Readings from Last 3 Encounters:  ?04/22/21 155 lb (70.3 kg)  ?04/08/21 155 lb (70.3 kg)  ?03/10/21 155 lb (70.3 kg)  ? ? ?Assessment/Interventions: Review of patient past medical history, allergies, medications, health status, including review of consultants reports, laboratory and other test data, was performed as part of comprehensive evaluation and provision of chronic care management services.  ? ?SDOH:  (Social Determinants of Health) assessments and interventions performed: Yes ? ? ? ?CCM Care Plan ? ?Allergies  ?Allergen Reactions  ? Atorvastatin Other (See Comments)  ?  Increased LFTs  ? ? ?Medications Reviewed Today   ? ? Reviewed by Tomasa Blase, [facility] (Pharmacist) on 08/28/21 at 1316   Med List Status: <None>  ? ?Medication Order Taking? Sig Documenting Provider Last Dose Status  Informant  ?beta carotene 25000 UNIT capsule [id number] Yes Take 25,000 Units by mouth daily. [provider] Taking Active   ?clonazePAM (KLONOPIN) 0.5 MG tablet [id number] Yes Take 1 tablet (0.5 mg total) by mouth at bedtime as needed. Binnie Rail, MD Taking Active   ?         ?Med Note Charlton Haws   Thu Jul 17, 2020  9:36 AM) Takes < once a month  ?fluticasone (FLONASE) 50 MCG/ACT nasal spray [id number] Yes PLACE 1 SPRAY INTO BOTH NOSTRILS DAILY Burns, Claudina Lick, MD Taking Active   ?glycopyrrolate (ROBINUL) 2 MG tablet [id number] Yes Take 1 tablet (2 mg total) by mouth every morning. Esterwood, Amy S, PA-C Taking Active   ?ibuprofen (ADVIL) 200 MG tablet [id number] Yes Take 200 mg by mouth every 6 (six) hours as needed. [provider] Taking Active   ?levothyroxine (SYNTHROID) 50 MCG tablet [id number] Yes Take 1 tablet (50 mcg total) by mouth daily. Binnie Rail, MD Taking Active   ?pyridOXINE (VITAMIN B-6) 100 MG tablet [id number] Yes Take 100 mg by mouth daily. [provider] Taking Active Self  ?simvastatin (ZOCOR) 40 MG tablet [id number] Yes Take 1 tablet (40 mg total) by mouth at bedtime. Binnie Rail, MD Taking Active   ?vitamin C (ASCORBIC ACID) 500 MG tablet [id number] Yes Take 500 mg by mouth 2 (two) times daily. [provider] Taking Active Self  ?vitamin E 400 UNIT capsule [id number] Yes Take 400 Units by mouth daily. [provider] Taking Active   ? ?  ?  ? ?  ? ? ?Patient Active Problem List  ? Diagnosis Date Noted  ? Murmur 02/11/2021  ? Premature beats 02/11/2021  ? Frequent PVCs 02/11/2021  ? LUQ pain 01/16/2019  ? Right cervical radiculopathy 03/03/2017  ? Carotid artery disease ([place]) 09/15/2016  ? Spinal stenosis, lumbar 10/08/2015  ? Sensorineural hearing loss of both ears 04/10/2014  ? Organic impotence 04/10/2014  ? Hyperglycemia 04/02/2014  ? S/P  left TKA 07/03/2013  ? Left knee DJD 06/07/2013  ? Hypothyroidism 03/03/2012  ? Axillary mass, right 04/07/2011  ? ROSACEA 03/19/2010  ? ARTHRALGIA 03/19/2010  ? CERVICALGIA 09/05/2009  ? CARPAL TUNNEL SYND

## 2021-08-28 NOTE — Patient Instructions (Signed)
Visit Information ? ?Following are the goals we discussed today:  ? ?Manage My Medicine  ? ?Timeframe:  Long-Range Goal ?Priority:  Medium ?Start Date:     07/17/20                        ?Expected End Date:  08/29/2022                   ? ?Follow Up Date 08/29/2022 ?  ?- call for medicine refill 2 or 3 days before it runs out ?- call if I am sick and can't take my medicine ?- keep a list of all the medicines I take; vitamins and herbals too ?- use a pillbox to sort medicine  ?  ?Why is this important?   ?These steps will help you keep on track with your medicines. ? ?Plan: Telephone follow up appointment with care management team member scheduled for:  12 months  ?The patient has been provided with contact information for the care management team and has been advised to call with any health related questions or concerns.  ? ?Tomasa Blase, PharmD ?Clinical Pharmacist, [place]  ? ?Please call the care guide team at [phone] if you need to cancel or reschedule your appointment.  ? ?Patient verbalizes understanding of instructions and care plan provided today and agrees to view in [place]. Active MyChart status confirmed with patient.   ? ?

## 2021-09-06 ENCOUNTER — Other Ambulatory Visit: Payer: Self-pay | Admitting: Internal Medicine

## 2021-09-07 DIAGNOSIS — M545 Low back pain, unspecified: Secondary | ICD-10-CM | POA: Diagnosis not present

## 2021-09-18 DIAGNOSIS — M25562 Pain in left knee: Secondary | ICD-10-CM | POA: Diagnosis not present

## 2021-09-18 DIAGNOSIS — M25571 Pain in right ankle and joints of right foot: Secondary | ICD-10-CM | POA: Diagnosis not present

## 2021-09-20 DIAGNOSIS — E039 Hypothyroidism, unspecified: Secondary | ICD-10-CM | POA: Diagnosis not present

## 2021-09-20 DIAGNOSIS — I2510 Atherosclerotic heart disease of native coronary artery without angina pectoris: Secondary | ICD-10-CM

## 2021-09-20 DIAGNOSIS — E785 Hyperlipidemia, unspecified: Secondary | ICD-10-CM

## 2021-10-06 DIAGNOSIS — M545 Low back pain, unspecified: Secondary | ICD-10-CM | POA: Diagnosis not present

## 2021-10-09 DIAGNOSIS — N5201 Erectile dysfunction due to arterial insufficiency: Secondary | ICD-10-CM | POA: Diagnosis not present

## 2021-10-09 DIAGNOSIS — Z8546 Personal history of malignant neoplasm of prostate: Secondary | ICD-10-CM | POA: Diagnosis not present

## 2021-10-09 DIAGNOSIS — N393 Stress incontinence (female) (male): Secondary | ICD-10-CM | POA: Diagnosis not present

## 2021-10-22 DIAGNOSIS — M545 Low back pain, unspecified: Secondary | ICD-10-CM | POA: Diagnosis not present

## 2021-11-09 DIAGNOSIS — M545 Low back pain, unspecified: Secondary | ICD-10-CM | POA: Diagnosis not present

## 2021-11-19 DIAGNOSIS — D225 Melanocytic nevi of trunk: Secondary | ICD-10-CM | POA: Diagnosis not present

## 2021-11-19 DIAGNOSIS — Z85828 Personal history of other malignant neoplasm of skin: Secondary | ICD-10-CM | POA: Diagnosis not present

## 2021-11-19 DIAGNOSIS — L821 Other seborrheic keratosis: Secondary | ICD-10-CM | POA: Diagnosis not present

## 2021-11-19 DIAGNOSIS — L570 Actinic keratosis: Secondary | ICD-10-CM | POA: Diagnosis not present

## 2021-11-19 DIAGNOSIS — D2271 Melanocytic nevi of right lower limb, including hip: Secondary | ICD-10-CM | POA: Diagnosis not present

## 2021-12-02 DIAGNOSIS — Z961 Presence of intraocular lens: Secondary | ICD-10-CM | POA: Diagnosis not present

## 2021-12-02 DIAGNOSIS — H35342 Macular cyst, hole, or pseudohole, left eye: Secondary | ICD-10-CM | POA: Diagnosis not present

## 2021-12-02 DIAGNOSIS — H4323 Crystalline deposits in vitreous body, bilateral: Secondary | ICD-10-CM | POA: Diagnosis not present

## 2021-12-03 DIAGNOSIS — M545 Low back pain, unspecified: Secondary | ICD-10-CM | POA: Diagnosis not present

## 2021-12-28 DIAGNOSIS — M545 Low back pain, unspecified: Secondary | ICD-10-CM | POA: Diagnosis not present

## 2022-01-19 DIAGNOSIS — M545 Low back pain, unspecified: Secondary | ICD-10-CM | POA: Diagnosis not present

## 2022-02-02 ENCOUNTER — Other Ambulatory Visit: Payer: Self-pay | Admitting: Internal Medicine

## 2022-02-02 ENCOUNTER — Other Ambulatory Visit: Payer: Self-pay | Admitting: Physician Assistant

## 2022-02-08 ENCOUNTER — Telehealth: Payer: Self-pay | Admitting: Internal Medicine

## 2022-02-08 DIAGNOSIS — E038 Other specified hypothyroidism: Secondary | ICD-10-CM

## 2022-02-08 DIAGNOSIS — E782 Mixed hyperlipidemia: Secondary | ICD-10-CM

## 2022-02-08 DIAGNOSIS — R739 Hyperglycemia, unspecified: Secondary | ICD-10-CM

## 2022-02-08 NOTE — Telephone Encounter (Signed)
Patient is having his physical on 10/26.  He would like to have his labs done at [facility], the week before.  Please put lab orders in.

## 2022-02-08 NOTE — Telephone Encounter (Signed)
Blood work ordered.

## 2022-02-11 DIAGNOSIS — M545 Low back pain, unspecified: Secondary | ICD-10-CM | POA: Diagnosis not present

## 2022-02-12 ENCOUNTER — Encounter: Payer: Self-pay | Admitting: Internal Medicine

## 2022-02-16 ENCOUNTER — Other Ambulatory Visit: Payer: Self-pay | Admitting: Internal Medicine

## 2022-03-03 DIAGNOSIS — M545 Low back pain, unspecified: Secondary | ICD-10-CM | POA: Diagnosis not present

## 2022-03-11 ENCOUNTER — Other Ambulatory Visit (INDEPENDENT_AMBULATORY_CARE_PROVIDER_SITE_OTHER): Payer: PPO

## 2022-03-11 DIAGNOSIS — R739 Hyperglycemia, unspecified: Secondary | ICD-10-CM | POA: Diagnosis not present

## 2022-03-11 DIAGNOSIS — E038 Other specified hypothyroidism: Secondary | ICD-10-CM

## 2022-03-11 DIAGNOSIS — E782 Mixed hyperlipidemia: Secondary | ICD-10-CM

## 2022-03-11 LAB — COMPREHENSIVE METABOLIC PANEL
ALT: 22 U/L (ref 0–53)
AST: 28 U/L (ref 0–37)
Albumin: 4.2 g/dL (ref 3.5–5.2)
Alkaline Phosphatase: 44 U/L (ref 39–117)
BUN: 13 mg/dL (ref 6–23)
CO2: 28 mEq/L (ref 19–32)
Calcium: 9.4 mg/dL (ref 8.4–10.5)
Chloride: 106 mEq/L (ref 96–112)
Creatinine, Ser: 0.89 mg/dL (ref 0.40–1.50)
GFR: 84.11 mL/min (ref >60.00)
Glucose, Bld: 92 mg/dL (ref 70–99)
Potassium: 4.1 mEq/L (ref 3.5–5.1)
Sodium: 142 mEq/L (ref 135–145)
Total Bilirubin: 0.4 mg/dL (ref 0.2–1.2)
Total Protein: 7.1 g/dL (ref 6.0–8.3)

## 2022-03-11 LAB — LIPID PANEL
Cholesterol: 196 mg/dL (ref 0–200)
HDL: 72.6 mg/dL (ref >39.00)
LDL Cholesterol: 109 mg/dL — ABNORMAL HIGH (ref 0–99)
NonHDL: 123.36
Total CHOL/HDL Ratio: 3
Triglycerides: 72 mg/dL (ref 0.0–149.0)
VLDL: 14.4 mg/dL (ref 0.0–40.0)

## 2022-03-11 LAB — CBC WITH DIFFERENTIAL/PLATELET
Basophils Absolute: 0.1 10*3/uL (ref 0.0–0.1)
Basophils Relative: 0.9 % (ref 0.0–3.0)
Eosinophils Absolute: 0.2 10*3/uL (ref 0.0–0.7)
Eosinophils Relative: 3.8 % (ref 0.0–5.0)
HCT: 42.2 % (ref 39.0–52.0)
Hemoglobin: 14 g/dL (ref 13.0–17.0)
Lymphocytes Relative: 33.3 % (ref 12.0–46.0)
Lymphs Abs: 1.9 10*3/uL (ref 0.7–4.0)
MCHC: 33.3 g/dL (ref 30.0–36.0)
MCV: 98.7 fl (ref 78.0–100.0)
Monocytes Absolute: 0.9 10*3/uL (ref 0.1–1.0)
Monocytes Relative: 15.9 % — ABNORMAL HIGH (ref 3.0–12.0)
Neutro Abs: 2.7 10*3/uL (ref 1.4–7.7)
Neutrophils Relative %: 46.1 % (ref 43.0–77.0)
Platelets: 246 10*3/uL (ref 150.0–400.0)
RBC: 4.27 Mil/uL (ref 4.22–5.81)
RDW: 14 % (ref 11.5–15.5)
WBC: 5.8 10*3/uL (ref 4.0–10.5)

## 2022-03-11 LAB — TSH: TSH: 3.06 u[IU]/mL (ref 0.35–5.50)

## 2022-03-11 LAB — HEMOGLOBIN A1C: Hgb A1c MFr Bld: 5.5 % (ref 4.6–6.5)

## 2022-03-17 ENCOUNTER — Encounter: Payer: Self-pay | Admitting: Internal Medicine

## 2022-03-17 NOTE — Patient Instructions (Addendum)
Blood work was ordered.   The lab is on the first floor.    Medications changes include :   none    A referral was ordered for XXX.     Someone will call you to schedule an appointment.    Return in about 1 year (around 03/19/2023) for Physical Exam.    Health Maintenance, Male Adopting a healthy lifestyle and getting preventive care are important in promoting health and wellness. Ask your health care provider about: The right schedule for you to have regular tests and exams. Things you can do on your own to prevent diseases and keep yourself healthy. What should I know about diet, weight, and exercise? Eat a healthy diet  Eat a diet that includes plenty of vegetables, fruits, low-fat dairy products, and lean protein. Do not eat a lot of foods that are high in solid fats, added sugars, or sodium. Maintain a healthy weight Body mass index (BMI) is a measurement that can be used to identify possible weight problems. It estimates body fat based on height and weight. Your health care provider can help determine your BMI and help you achieve or maintain a healthy weight. Get regular exercise Get regular exercise. This is one of the most important things you can do for your health. Most adults should: Exercise for at least 150 minutes each week. The exercise should increase your heart rate and make you sweat (moderate-intensity exercise). Do strengthening exercises at least twice a week. This is in addition to the moderate-intensity exercise. Spend less time sitting. Even light physical activity can be beneficial. Watch cholesterol and blood lipids Have your blood tested for lipids and cholesterol at 75 years of age, then have this test every 5 years. You may need to have your cholesterol levels checked more often if: Your lipid or cholesterol levels are high. You are older than 75 years of age. You are at high risk for heart disease. What should I know about cancer  screening? Many types of cancers can be detected early and may often be prevented. Depending on your health history and family history, you may need to have cancer screening at various ages. This may include screening for: Colorectal cancer. Prostate cancer. Skin cancer. Lung cancer. What should I know about heart disease, diabetes, and high blood pressure? Blood pressure and heart disease High blood pressure causes heart disease and increases the risk of stroke. This is more likely to develop in people who have high blood pressure readings or are overweight. Talk with your health care provider about your target blood pressure readings. Have your blood pressure checked: Every 3-5 years if you are 46-7 years of age. Every year if you are 79 years old or older. If you are between the ages of 64 and 72 and are a current or former smoker, ask your health care provider if you should have a one-time screening for abdominal aortic aneurysm (AAA). Diabetes Have regular diabetes screenings. This checks your fasting blood sugar level. Have the screening done: Once every three years after age 4 if you are at a normal weight and have a low risk for diabetes. More often and at a younger age if you are overweight or have a high risk for diabetes. What should I know about preventing infection? Hepatitis B If you have a higher risk for hepatitis B, you should be screened for this virus. Talk with your health care provider to find out if you are at risk  for hepatitis B infection. Hepatitis C Blood testing is recommended for: Everyone born from [birth year] through 1964. Anyone with known risk factors for hepatitis C. Sexually transmitted infections (STIs) You should be screened each year for STIs, including gonorrhea and chlamydia, if: You are sexually active and are younger than 75 years of age. You are older than 75 years of age and your health care provider tells you that you are at risk for this type of  infection. Your sexual activity has changed since you were last screened, and you are at increased risk for chlamydia or gonorrhea. Ask your health care provider if you are at risk. Ask your health care provider about whether you are at high risk for HIV. Your health care provider may recommend a prescription medicine to help prevent HIV infection. If you choose to take medicine to prevent HIV, you should first get tested for HIV. You should then be tested every 3 months for as long as you are taking the medicine. Follow these instructions at home: Alcohol use Do not drink alcohol if your health care provider tells you not to drink. If you drink alcohol: Limit how much you have to 0-2 drinks a day. Know how much alcohol is in your drink. In the [place] one drink equals one 12 oz bottle of beer (355 mL), one 5 oz glass of wine (148 mL), or one 1 oz glass of hard liquor (44 mL). Lifestyle Do not use any products that contain nicotine or tobacco. These products include cigarettes, chewing tobacco, and vaping devices, such as e-cigarettes. If you need help quitting, ask your health care provider. Do not use street drugs. Do not share needles. Ask your health care provider for help if you need support or information about quitting drugs. General instructions Schedule regular health, dental, and eye exams. Stay current with your vaccines. Tell your health care provider if: You often feel depressed. You have ever been abused or do not feel safe at home. Summary Adopting a healthy lifestyle and getting preventive care are important in promoting health and wellness. Follow your health care provider's instructions about healthy diet, exercising, and getting tested or screened for diseases. Follow your health care provider's instructions on monitoring your cholesterol and blood pressure. This information is not intended to replace advice given to you by your health care provider. Make sure you discuss  any questions you have with your health care provider. Document Revised: 09/29/2020 Document Reviewed: 09/29/2020 Elsevier Patient Education  [place].

## 2022-03-17 NOTE — Progress Notes (Unsigned)
Subjective:    Patient ID: Jeremy Waters, male    DOB: January 02, 1946, 76 y.o.   MRN: [id number]     HPI Jeremy Waters is here for a physical exam.   Reviewed labs  Occ GERD - goes through spells - one week may have it several times, often infrequent  Not as steady on his feet - he is dong balance exercises.    Medications and allergies reviewed with patient and updated if appropriate.  Current Outpatient Medications on File Prior to Visit  Medication Sig Dispense Refill   beta carotene 25000 UNIT capsule Take 25,000 Units by mouth daily.     clonazePAM (KLONOPIN) 0.5 MG tablet TAKE ONE TABLET BY MOUTH AT BEDTIME AS NEEDED 30 tablet 0   fluticasone (FLONASE) 50 MCG/ACT nasal spray PLACE 1 SPRAY INTO BOTH NOSTRILS DAILY 48 mL 2   glycopyrrolate (ROBINUL) 2 MG tablet Take 1 tablet by mouth every morning 90 tablet 0   ibuprofen (ADVIL) 200 MG tablet Take 200 mg by mouth every 6 (six) hours as needed.     levothyroxine (SYNTHROID) 50 MCG tablet Take 1 tablet by mouth daily 90 tablet 2   pyridOXINE (VITAMIN B-6) 100 MG tablet Take 100 mg by mouth daily.     simvastatin (ZOCOR) 40 MG tablet Take 1 tablet by mouth at bedtime 90 tablet 2   vitamin C (ASCORBIC ACID) 500 MG tablet Take 500 mg by mouth 2 (two) times daily.     vitamin E 400 UNIT capsule Take 400 Units by mouth daily.     No current facility-administered medications on file prior to visit.    Review of Systems  Constitutional:  Negative for chills and fever.  Eyes:  Negative for visual disturbance.  Respiratory:  Negative for cough, shortness of breath and wheezing.   Cardiovascular:  Negative for chest pain, palpitations and leg swelling.  Gastrointestinal:  Positive for constipation (mild). Negative for abdominal pain, blood in stool, diarrhea and nausea.       Occ gerd  Genitourinary:  Negative for difficulty urinating and dysuria.  Musculoskeletal:  Positive for arthralgias. Negative for back pain.  Skin:  Negative  for rash.  Neurological:  Positive for numbness (in right toes since back surgery). Negative for light-headedness and headaches.  Psychiatric/Behavioral:  Negative for dysphoric mood. The patient is nervous/anxious (occ).        Objective:   Vitals:   03/18/22 0836  BP: 134/78  Pulse: 61  Temp: 98.2 F (36.8 C)  SpO2: 97%   Filed Weights   03/18/22 0836  Weight: 156 lb 3.2 oz (70.9 kg)   Body mass index is 24.46 kg/m.  BP Readings from Last 3 Encounters:  03/18/22 134/78  04/22/21 (!) 142/72  03/10/21 133/74    Wt Readings from Last 3 Encounters:  03/18/22 156 lb 3.2 oz (70.9 kg)  04/22/21 155 lb (70.3 kg)  04/08/21 155 lb (70.3 kg)      Physical Exam Constitutional: He appears well-developed and well-nourished. No distress.  HENT:  Head: Normocephalic and atraumatic.  Right Ear: External ear normal.  Left Ear: External ear normal.  Mouth/Throat: Oropharynx is clear and moist.  Normal ear canals and TM b/l  Eyes: Conjunctivae and EOM are normal.  Neck: Neck supple. No tracheal deviation present. No thyromegaly present.  No carotid bruit  Cardiovascular: Normal rate, regular rhythm, normal heart sounds and intact distal pulses.   No murmur heard. Pulmonary/Chest: Effort normal and breath sounds normal. No  respiratory distress. He has no wheezes. He has no rales.  Abdominal: Soft. He exhibits no distension. There is no tenderness.  Genitourinary: deferred  Musculoskeletal: He exhibits no edema.  Lymphadenopathy:   He has no cervical adenopathy.  Skin: Skin is warm and dry. He is not diaphoretic.  Psychiatric: He has a normal mood and affect. His behavior is normal.    Lab Results  Component Value Date   WBC 5.8 03/11/2022   HGB 14.0 03/11/2022   HCT 42.2 03/11/2022   PLT 246.0 03/11/2022   GLUCOSE 92 03/11/2022   CHOL 196 03/11/2022   TRIG 72.0 03/11/2022   HDL 72.60 03/11/2022   LDLDIRECT 179.6 01/04/2013   LDLCALC 109 (H) 03/11/2022   ALT 22  03/11/2022   AST 28 03/11/2022   NA 142 03/11/2022   K 4.1 03/11/2022   CL 106 03/11/2022   CREATININE 0.89 03/11/2022   BUN 13 03/11/2022   CO2 28 03/11/2022   TSH 3.06 03/11/2022   INR 0.97 06/15/2013   HGBA1C 5.5 03/11/2022        Assessment & Plan:   Physical exam: Screening blood work  reviewed / ordered Exercise    regular - elliptical trainer daily, stretches, planks Weight normal Substance abuse   none   Reviewed recommended immunizations.   Health Maintenance  Topic Date Due   Medicare Annual Wellness (AWV)  03/25/2022   COLONOSCOPY (Pts 45-64yr Insurance coverage will need to be confirmed)  04/22/2024   TETANUS/TDAP  08/02/2027   Pneumonia Vaccine 75 Years old  Completed   INFLUENZA VACCINE  Completed   COVID-19 Vaccine  Completed   Hepatitis C Screening  Completed   Zoster Vaccines- Shingrix  Completed   HPV VACCINES  Aged Out     See Problem List for Assessment and Plan of chronic medical problems.

## 2022-03-18 ENCOUNTER — Ambulatory Visit (INDEPENDENT_AMBULATORY_CARE_PROVIDER_SITE_OTHER): Payer: PPO

## 2022-03-18 ENCOUNTER — Ambulatory Visit (INDEPENDENT_AMBULATORY_CARE_PROVIDER_SITE_OTHER): Payer: PPO | Admitting: Internal Medicine

## 2022-03-18 VITALS — BP 134/78 | HR 61 | Temp 98.2°F | Ht 67.0 in | Wt 156.2 lb

## 2022-03-18 DIAGNOSIS — E782 Mixed hyperlipidemia: Secondary | ICD-10-CM

## 2022-03-18 DIAGNOSIS — E038 Other specified hypothyroidism: Secondary | ICD-10-CM | POA: Diagnosis not present

## 2022-03-18 DIAGNOSIS — I6523 Occlusion and stenosis of bilateral carotid arteries: Secondary | ICD-10-CM

## 2022-03-18 DIAGNOSIS — G479 Sleep disorder, unspecified: Secondary | ICD-10-CM | POA: Diagnosis not present

## 2022-03-18 DIAGNOSIS — Z0000 Encounter for general adult medical examination without abnormal findings: Secondary | ICD-10-CM

## 2022-03-18 DIAGNOSIS — K219 Gastro-esophageal reflux disease without esophagitis: Secondary | ICD-10-CM | POA: Insufficient documentation

## 2022-03-18 DIAGNOSIS — R739 Hyperglycemia, unspecified: Secondary | ICD-10-CM | POA: Diagnosis not present

## 2022-03-18 MED ORDER — ROSUVASTATIN CALCIUM 40 MG PO TABS: 40.0000 mg | ORAL_TABLET | Freq: Every day | ORAL | 5 refills | Status: DC

## 2022-03-18 NOTE — Assessment & Plan Note (Signed)
Chronic Carotid arteries with minimal stenosis-has been stable Continue simvastatin 40 mg daily Healthy diet, regular exercise

## 2022-03-18 NOTE — Assessment & Plan Note (Addendum)
Chronic Regular exercise and healthy diet encouraged LDL above goal Change to rosuvastatin 40 mg daily Ck lipids, hepatic function in about 4 weeks

## 2022-03-18 NOTE — Assessment & Plan Note (Addendum)
Chronic Lab Results  Component Value Date   HGBA1C 5.5 03/11/2022    Low sugar / carb diet Stressed regular exercise

## 2022-03-18 NOTE — Assessment & Plan Note (Addendum)
Chronic  Clinically euthyroid tsh in normal range Continue levothyroxine 50 mcg daily

## 2022-03-18 NOTE — Assessment & Plan Note (Signed)
New Having increased GERD at times Taking pepcid, tums prn Advised he may need to take pepcid 20 mg daily for few days to help with flares or daily if needed to control GERD

## 2022-03-18 NOTE — Patient Instructions (Signed)
Jeremy Waters , Thank you for taking time to come for your Medicare Wellness Visit. I appreciate your ongoing commitment to your health goals. Please review the following plan we discussed and let me know if I can assist you in the future.   These are the goals we discussed:  Goals      Manage My Medicine     Timeframe:  Long-Range Goal Priority:  Medium Start Date:     07/17/20                        Expected End Date:  08/29/2022                    Follow Up Date 08/29/2022   - call for medicine refill 2 or 3 days before it runs out - call if I am sick and can't take my medicine - keep a list of all the medicines I take; vitamins and herbals too - use a pillbox to sort medicine    Why is this important?   These steps will help you keep on track with your medicines.      TO LOSE 5-10 POUNDS.        This is a list of the screening recommended for you and due dates:  Health Maintenance  Topic Date Due   Medicare Annual Wellness Visit  03/25/2022   Colon Cancer Screening  04/22/2024   Tetanus Vaccine  08/02/2027   Pneumonia Vaccine  Completed   Flu Shot  Completed   COVID-19 Vaccine  Completed   Hepatitis C Screening: USPSTF Recommendation to screen - Ages 18-79 yo.  Completed   Zoster (Shingles) Vaccine  Completed   HPV Vaccine  Aged Out    Advanced directives: YES  Conditions/risks identified: YES  Next appointment: Follow up in one year for your annual wellness visit.   Preventive Care 38 Years and Older, Male  Preventive care refers to lifestyle choices and visits with your health care provider that can promote health and wellness. What does preventive care include? A yearly physical exam. This is also called an annual well check. Dental exams once or twice a year. Routine eye exams. Ask your health care provider how often you should have your eyes checked. Personal lifestyle choices, including: Daily care of your teeth and gums. Regular physical activity. Eating a  healthy diet. Avoiding tobacco and drug use. Limiting alcohol use. Practicing safe sex. Taking low doses of aspirin every day. Taking vitamin and mineral supplements as recommended by your health care provider. What happens during an annual well check? The services and screenings done by your health care provider during your annual well check will depend on your age, overall health, lifestyle risk factors, and family history of disease. Counseling  Your health care provider may ask you questions about your: Alcohol use. Tobacco use. Drug use. Emotional well-being. Home and relationship well-being. Sexual activity. Eating habits. History of falls. Memory and ability to understand (cognition). Work and work Statistician. Screening  You may have the following tests or measurements: Height, weight, and BMI. Blood pressure. Lipid and cholesterol levels. These may be checked every 5 years, or more frequently if you are over 75 years old. Skin check. Lung cancer screening. You may have this screening every year starting at age 20 if you have a 30-pack-year history of smoking and currently smoke or have quit within the past 15 years. Fecal occult blood test (FOBT) of the stool.  You may have this test every year starting at age 12. Flexible sigmoidoscopy or colonoscopy. You may have a sigmoidoscopy every 5 years or a colonoscopy every 10 years starting at age 32. Prostate cancer screening. Recommendations will vary depending on your family history and other risks. Hepatitis C blood test. Hepatitis B blood test. Sexually transmitted disease (STD) testing. Diabetes screening. This is done by checking your blood sugar (glucose) after you have not eaten for a while (fasting). You may have this done every 1-3 years. Abdominal aortic aneurysm (AAA) screening. You may need this if you are a current or former smoker. Osteoporosis. You may be screened starting at age 1 if you are at high risk. Talk  with your health care provider about your test results, treatment options, and if necessary, the need for more tests. Vaccines  Your health care provider may recommend certain vaccines, such as: Influenza vaccine. This is recommended every year. Tetanus, diphtheria, and acellular pertussis (Tdap, Td) vaccine. You may need a Td booster every 10 years. Zoster vaccine. You may need this after age 36. Pneumococcal 13-valent conjugate (PCV13) vaccine. One dose is recommended after age 81. Pneumococcal polysaccharide (PPSV23) vaccine. One dose is recommended after age 14. Talk to your health care provider about which screenings and vaccines you need and how often you need them. This information is not intended to replace advice given to you by your health care provider. Make sure you discuss any questions you have with your health care provider. Document Released: 06/06/2015 Document Revised: 01/28/2016 Document Reviewed: 03/11/2015 Elsevier Interactive Patient Education  2017 [place] Prevention in the Home Falls can cause injuries. They can happen to people of all ages. There are many things you can do to make your home safe and to help prevent falls. What can I do on the outside of my home? Regularly fix the edges of walkways and driveways and fix any cracks. Remove anything that might make you trip as you walk through a door, such as a raised step or threshold. Trim any bushes or trees on the path to your home. Use bright outdoor lighting. Clear any walking paths of anything that might make someone trip, such as rocks or tools. Regularly check to see if handrails are loose or broken. Make sure that both sides of any steps have handrails. Any raised decks and porches should have guardrails on the edges. Have any leaves, snow, or ice cleared regularly. Use sand or salt on walking paths during winter. Clean up any spills in your garage right away. This includes oil or grease  spills. What can I do in the bathroom? Use night lights. Install grab bars by the toilet and in the tub and shower. Do not use towel bars as grab bars. Use non-skid mats or decals in the tub or shower. If you need to sit down in the shower, use a plastic, non-slip stool. Keep the floor dry. Clean up any water that spills on the floor as soon as it happens. Remove soap buildup in the tub or shower regularly. Attach bath mats securely with double-sided non-slip rug tape. Do not have throw rugs and other things on the floor that can make you trip. What can I do in the bedroom? Use night lights. Make sure that you have a light by your bed that is easy to reach. Do not use any sheets or blankets that are too big for your bed. They should not hang down onto the floor. Have  a firm chair that has side arms. You can use this for support while you get dressed. Do not have throw rugs and other things on the floor that can make you trip. What can I do in the kitchen? Clean up any spills right away. Avoid walking on wet floors. Keep items that you use a lot in easy-to-reach places. If you need to reach something above you, use a strong step stool that has a grab bar. Keep electrical cords out of the way. Do not use floor polish or wax that makes floors slippery. If you must use wax, use non-skid floor wax. Do not have throw rugs and other things on the floor that can make you trip. What can I do with my stairs? Do not leave any items on the stairs. Make sure that there are handrails on both sides of the stairs and use them. Fix handrails that are broken or loose. Make sure that handrails are as long as the stairways. Check any carpeting to make sure that it is firmly attached to the stairs. Fix any carpet that is loose or worn. Avoid having throw rugs at the top or bottom of the stairs. If you do have throw rugs, attach them to the floor with carpet tape. Make sure that you have a light switch at the  top of the stairs and the bottom of the stairs. If you do not have them, ask someone to add them for you. What else can I do to help prevent falls? Wear shoes that: Do not have high heels. Have rubber bottoms. Are comfortable and fit you well. Are closed at the toe. Do not wear sandals. If you use a stepladder: Make sure that it is fully opened. Do not climb a closed stepladder. Make sure that both sides of the stepladder are locked into place. Ask someone to hold it for you, if possible. Clearly mark and make sure that you can see: Any grab bars or handrails. First and last steps. Where the edge of each step is. Use tools that help you move around (mobility aids) if they are needed. These include: Canes. Walkers. Scooters. Crutches. Turn on the lights when you go into a dark area. Replace any light bulbs as soon as they burn out. Set up your furniture so you have a clear path. Avoid moving your furniture around. If any of your floors are uneven, fix them. If there are any pets around you, be aware of where they are. Review your medicines with your doctor. Some medicines can make you feel dizzy. This can increase your chance of falling. Ask your doctor what other things that you can do to help prevent falls. This information is not intended to replace advice given to you by your health care provider. Make sure you discuss any questions you have with your health care provider. Document Released: 03/06/2009 Document Revised: 10/16/2015 Document Reviewed: 06/14/2014 Elsevier Interactive Patient Education  2017 [employer].

## 2022-03-18 NOTE — Progress Notes (Signed)
Subjective:   Jeremy Waters is a 76 y.o. male who presents for Medicare Annual/Subsequent preventive examination.  Review of Systems     Cardiac Risk Factors include: advanced age (>63mn, >>79women);dyslipidemia;family history of premature cardiovascular disease;male gender     Objective:    Today's Vitals   03/18/22 0914  BP: 134/78  Pulse: 61  Temp: 98.2 F (36.8 C)  SpO2: 97%  Weight: 156 lb 3.2 oz (70.9 kg)  Height: '5\' 7"'$  (1.702 m)  PainSc: 0-No pain   Body mass index is 24.46 kg/m.     03/18/2022    9:21 AM 02/23/2021    3:58 PM 12/01/2018    3:14 PM 10/20/2017   10:27 AM 10/11/2017    4:26 PM 12/26/2015   10:58 AM 10/08/2015    1:30 PM  Advanced Directives  Does Patient Have a Medical Advance Directive? Yes Yes Yes Yes Yes Yes Yes  Type of AParamed[place]of [place] will Living will;Healthcare Power of [place] will  [place] will [place] will [place]Living will  Does patient want to make changes to medical advance directive?  No - Patient declined     Yes - information given  Copy of [place] Chart? No - copy requested No - copy requested No - copy requested  No - copy requested      Current Medications (verified) Outpatient Encounter Medications as of 03/18/2022  Medication Sig   beta carotene 25000 UNIT capsule Take 25,000 Units by mouth daily.   clonazePAM (KLONOPIN) 0.5 MG tablet TAKE ONE TABLET BY MOUTH AT BEDTIME AS NEEDED   fluticasone (FLONASE) 50 MCG/ACT nasal spray PLACE 1 SPRAY INTO BOTH NOSTRILS DAILY   glycopyrrolate (ROBINUL) 2 MG tablet Take 1 tablet by mouth every morning   ibuprofen (ADVIL) 200 MG tablet Take 200 mg by mouth every 6 (six) hours as needed.   levothyroxine (SYNTHROID) 50 MCG tablet Take 1 tablet by mouth daily   pyridOXINE (VITAMIN B-6) 100 MG tablet Take 100 mg by  mouth daily.   rosuvastatin (CRESTOR) 40 MG tablet Take 1 tablet (40 mg total) by mouth daily.   vitamin C (ASCORBIC ACID) 500 MG tablet Take 500 mg by mouth 2 (two) times daily.   vitamin E 400 UNIT capsule Take 400 Units by mouth daily.   No facility-administered encounter medications on file as of 03/18/2022.    Allergies (verified) Atorvastatin   History: Past Medical History:  Diagnosis Date   Adenomatous colon polyp 09/2007   Arthritis    OA / PAIN LEFT KNEE   [place]cer [place]    prostate cancer   Cataract    removed both eyes   COVID-19 virus infection    Diverticulosis of colon    w/o hemorrage   Heart murmur    TOLD HE HAS A SLIGHT MURMUR   Hyperlipidemia    Inguinal hernia    left side    Thyroid disease    Past Surgical History:  Procedure Laterality Date   BACK SURGERY  05/12/2005   L5   COLONOSCOPY  1999   Negative; Dr SFuller Plan  COLONOSCOPY  2004   tics, hemorrhoids   COLONOSCOPY  2009   polyps (T.A.)   [place] 10/2003   KNEE SURGERY  1969   Patellar fracture fragment Lt   LAMINECTOMY  2000   L4-5  LUMBAR LAMINECTOMY/DECOMPRESSION MICRODISCECTOMY Right 10/08/2015   Procedure: MICRO LUMBAR DECOMPRESSION L4-L5 AND L5-S1 ON RIGHT ;  Surgeon: Susa Day, MD;  Location: [facility];  Service: Orthopedics;  Laterality: Right;   MASS EXCISION  04/26/2011   Procedure: MINOR EXCISION OF MASS;  Surgeon: Earnstine Regal, MD;  Location: [facility];  Service: General;  Laterality: Right;  Excise subcutaneous mass right axilla Minor Room   POLYPECTOMY     PROSTATECTOMY  04/16/2005   TONSILLECTOMY     TOTAL KNEE ARTHROPLASTY Left 07/03/2013   Procedure: LEFT TOTAL KNEE ARTHROPLASTY;  Surgeon: Mauri Pole, MD;  Location: [facility];  Service: Orthopedics;  Laterality: Left;   Family History  Problem Relation Age of Onset   Lung cancer Mother    COPD Mother    Cancer Father        Bladder Cancer   Prostate cancer Father     Colon cancer Father 44   Urolithiasis Father    Lung cancer Sister    Alcohol abuse Paternal Uncle    Stroke Paternal Uncle 18   Hypertension Paternal Uncle    Asthma Neg Hx    Heart disease Neg Hx    Esophageal cancer Neg Hx    Stomach cancer Neg Hx    Liver disease Neg Hx    Colon polyps Neg Hx    Rectal cancer Neg Hx    Social History   Socioeconomic History   Marital status: Married    Spouse name: Not on file   Number of children: 2   Years of education: Not on file   Highest education level: Not on file  Occupational History   Occupation: insur exe    Employer: [employer]  Tobacco Use   Smoking status: Former   Smokeless tobacco: Never   Tobacco comments:    stopped smoking cigarettes 1983, 1 cigar /day 2001-2013  Vaping Use   Vaping Use: Never used  Substance and Sexual Activity   Alcohol use: Yes    Alcohol/week: 7.0 - 14.0 standard drinks of alcohol    Types: 7 - 14 Standard drinks or equivalent per week    Comment: scotch or wine daily   Drug use: No   Sexual activity: Yes  Other Topics Concern   Not on file  Social History Narrative   Exercises daily            Social Determinants of Health   Financial Resource Strain: Low Risk  (03/18/2022)   Overall Financial Resource Strain (CARDIA)    Difficulty of Paying Living Expenses: Not hard at all  Food Insecurity: No Food Insecurity (03/18/2022)   Hunger Vital Sign    Worried About Running Out of Food in the Last Year: Never true    Ran Out of Food in the Last Year: Never true  Transportation Needs: No Transportation Needs (03/18/2022)   PRAPARE - Hydrologist (Medical): No    Lack of Transportation (Non-Medical): No  Physical Activity: Sufficiently Active (03/18/2022)   Exercise Vital Sign    Days of Exercise per Week: 5 days    Minutes of Exercise per Session: 30 min  Stress: No Stress Concern Present (03/18/2022)   [place]    Feeling of Stress : Not at all  Social Connections: [place] (03/18/2022)   Social Connection and Isolation Panel [NHANES]    Frequency of Communication with Friends and Family:  More than three times a week    Frequency of Social Gatherings with Friends and Family: Three times a week    Attends Religious Services: 1 to 4 times per year    Active Member of Clubs or Organizations: Yes    Attends Archivist Meetings: 1 to 4 times per year    Marital Status: Married    Tobacco Counseling Counseling given: Not Answered Tobacco comments: stopped smoking cigarettes 1983, 1 cigar /day 2001-2013   Clinical Intake:  Pre-visit preparation completed: Yes  Pain : No/denies pain Pain Score: 0-No pain     BMI - recorded: 24.46 Nutritional Status: BMI of 19-24  Normal Nutritional Risks: None Diabetes: No  How often do you need to have someone help you when you read instructions, pamphlets, or other written materials from your doctor or pharmacy?: 1 - Never What is the last grade level you completed in school?: HSG  Diabetic? no  Interpreter Needed?: No  Information entered by :: Lisette Abu, LPN.   Activities of Daily Living    03/18/2022    9:17 AM  In your present state of health, do you have any difficulty performing the following activities:  Hearing? 0  Vision? 0  Difficulty concentrating or making decisions? 0  Walking or climbing stairs? 0  Dressing or bathing? 0  Doing errands, shopping? 0  Preparing Food and eating ? N  Using the Toilet? N  In the past six months, have you accidently leaked urine? N  Do you have problems with loss of bowel control? N  Managing your Medications? N  Managing your Finances? N  Housekeeping or managing your Housekeeping? N    Patient Care Team: Binnie Rail, MD as PCP - General (Internal Medicine) Newt Lukes, AUD (Audiology) Ladene Artist, MD as Consulting  Physician (Gastroenterology) Calvert Cantor, MD as Consulting Physician (Ophthalmology) Delice Bison, Darnelle Maffucci, [facility] (Inactive) (Pharmacist)  Indicate any recent Medical Services you may have received from other than [facility] providers in the past year (date may be approximate).     Assessment:   This is a routine wellness examination for [place].  Hearing/Vision screen Hearing Screening - Comments:: Patient wears hearing aids. Vision Screening - Comments:: Patient had lens implants.  No eyeglasses/readers needed.  [facility] exam done yearly by [facility].  Dietary issues and exercise activities discussed: Current Exercise Habits: Home exercise routine, Type of exercise: walking, Time (Minutes): 30, Frequency (Times/Week): 5, Weekly Exercise (Minutes/Week): 150, Intensity: Moderate, Exercise limited by: None identified   Goals Addressed             This Visit's Progress    TO LOSE 5-10 POUNDS.        Depression Screen    03/18/2022    8:41 AM 02/23/2021    3:57 PM 02/11/2021    7:44 PM 02/11/2020    1:50 PM 12/01/2018    3:15 PM 10/11/2017    4:26 PM 03/03/2017    1:49 PM  PHQ 2/9 Scores  PHQ - 2 Score 0 0 0 0 0 0 0  PHQ- 9 Score   3        Fall Risk    03/18/2022    9:21 AM 03/18/2022    8:42 AM 02/23/2021    3:53 PM 02/11/2021    3:25 PM 12/22/2018    4:25 PM  Fall Risk   Falls in the past year? 0 0 0 0 0  Comment     Emmi Telephone  Survey: data to providers prior to load  Number falls in past yr: 0 0 0 0   Injury with Fall? 0 0 0 0   Risk for fall due to : No Fall Risks No Fall Risks No Fall Risks No Fall Risks   Follow up Falls prevention discussed Falls evaluation completed Falls evaluation completed Falls evaluation completed     [place]:  Any stairs in or around the home? Yes  If so, are there any without handrails? No  Home free of loose throw rugs in walkways, pet beds, electrical cords, etc? Yes  Adequate lighting in your  home to reduce risk of falls? Yes   ASSISTIVE DEVICES UTILIZED TO PREVENT FALLS:  Life alert? No  Use of a cane, walker or w/c? No  Grab bars in the bathroom? No  Shower chair or bench in shower? No  Elevated toilet seat or a handicapped toilet? Yes   TIMED UP AND GO:  Was the test performed? Yes .  Length of time to ambulate 10 feet: 69 sec.   Gait steady and fast without use of assistive device  Cognitive Function:    10/11/2017    4:37 PM  MMSE - Mini Mental State Exam  Orientation to time 5  Orientation to Place 5  Registration 3  Attention/ Calculation 5  Recall 3  Language- name 2 objects 2  Language- repeat 1  Language- follow 3 step command 3  Language- read & follow direction 1  Write a sentence 1  Copy design 1  Total score 30        03/18/2022    9:17 AM  6CIT Screen  What Year? 0 points  What month? 0 points  What time? 0 points  Count back from 20 0 points  Months in reverse 0 points  Repeat phrase 0 points  Total Score 0 points    Immunizations Immunization History  Administered Date(s) Administered   Fluad Quad(high Dose 65+) 01/17/2019, 02/11/2020, 02/11/2021, 02/11/2022   Hepatitis A 10/15/1997, 12/04/1997   Hepatitis B 09/09/2008, 10/15/2008, 08/01/2017   Influenza Split 03/16/2011, 03/03/2012   Influenza, High Dose Seasonal PF 02/09/2016, 03/03/2017, 02/23/2018   Influenza,inj,Quad PF,6+ Mos 03/08/2014, 02/27/2015   Influenza-Unspecified 03/07/2013   Meningococcal Conjugate 09/09/2008   PFIZER(Purple Top)SARS-COV-2 Vaccination 06/18/2019, 07/09/2019, 02/29/2020, 08/29/2020   Pfizer Covid-19 Vaccine Bivalent Booster 66yr & up 04/17/2021, 02/11/2022   Pneumococcal Conjugate-13 11/20/2014   Pneumococcal Polysaccharide-23 06/07/2013   Tdap 05/24/2008, 08/01/2017   Typhoid Inactivated 09/04/1997, 07/30/2004, 08/01/2017   Yellow Fever 09/09/2008   Zoster Recombinat (Shingrix) 09/21/2016, 03/03/2017   Zoster, Live 03/18/2011    TDAP  status: Up to date  Flu Vaccine status: Up to date  Pneumococcal vaccine status: Up to date  Covid-19 vaccine status: Completed vaccines  Qualifies for Shingles Vaccine? Yes   Zostavax completed Yes   Shingrix Completed?: Yes  Screening Tests Health Maintenance  Topic Date Due   Medicare Annual Wellness (AWV)  04/18/2023   COLONOSCOPY (Pts 45-460yrInsurance coverage will need to be confirmed)  04/22/2024   TETANUS/TDAP  08/02/2027   Pneumonia Vaccine 6533Years old  Completed   INFLUENZA VACCINE  Completed   COVID-19 Vaccine  Completed   Hepatitis C Screening  Completed   Zoster Vaccines- Shingrix  Completed   HPV VACCINES  Aged Out    Health Maintenance  There are no preventive care reminders to display for this patient.   Colorectal cancer screening: Type of  screening: Colonoscopy. Completed 04/22/2021. Repeat every 3 years  Lung Cancer Screening: (Low Dose CT Chest recommended if Age 20-80 years, 30 pack-year currently smoking OR have quit w/in 15years.) does not qualify.   Lung Cancer Screening Referral: no  Additional Screening:  Hepatitis C Screening: does qualify; Completed 05/08/2015  Vision Screening: Recommended annual ophthalmology exams for early detection of glaucoma and other disorders of the eye. Is the patient up to date with their annual eye exam?  Yes  Who is the provider or what is the name of the office in which the patient attends annual eye exams? [facility] If pt is not established with a provider, would they like to be referred to a provider to establish care? No .   Dental Screening: Recommended annual dental exams for proper oral hygiene  Community Resource Referral / Chronic Care Management: CRR required this visit?  No   CCM required this visit?  No      Plan:     I have personally reviewed and noted the following in the patient's chart:   Medical and social history Use of alcohol, tobacco or illicit drugs  Current  medications and supplements including opioid prescriptions. Patient is not currently taking opioid prescriptions. Functional ability and status Nutritional status Physical activity Advanced directives List of other physicians Hospitalizations, surgeries, and ER visits in previous 12 months Vitals Screenings to include cognitive, depression, and falls Referrals and appointments  In addition, I have reviewed and discussed with patient certain preventive protocols, quality metrics, and best practice recommendations. A written personalized care plan for preventive services as well as general preventive health recommendations were provided to patient.     Sheral Flow, LPN   [id number]   Nurse Notes: N/A

## 2022-03-18 NOTE — Assessment & Plan Note (Addendum)
Chronic Controlled, Stable Continue clonazepam 0.5 mg nightly as needed - takes 1/2 pill and only 2-3 times a month - takes it when he is anxious when going to bed

## 2022-03-30 DIAGNOSIS — M545 Low back pain, unspecified: Secondary | ICD-10-CM | POA: Diagnosis not present

## 2022-04-06 DIAGNOSIS — H6123 Impacted cerumen, bilateral: Secondary | ICD-10-CM | POA: Diagnosis not present

## 2022-04-14 ENCOUNTER — Other Ambulatory Visit: Payer: Self-pay

## 2022-04-14 MED ORDER — GLYCOPYRROLATE 2 MG PO TABS: 2.0000 mg | ORAL_TABLET | Freq: Every morning | ORAL | 0 refills | Status: DC

## 2022-04-14 NOTE — Telephone Encounter (Signed)
Glycoprrolate refilled as requested.

## 2022-04-20 DIAGNOSIS — M545 Low back pain, unspecified: Secondary | ICD-10-CM | POA: Diagnosis not present

## 2022-05-03 ENCOUNTER — Encounter: Payer: Self-pay | Admitting: Internal Medicine

## 2022-05-04 ENCOUNTER — Other Ambulatory Visit (INDEPENDENT_AMBULATORY_CARE_PROVIDER_SITE_OTHER): Payer: PPO

## 2022-05-04 DIAGNOSIS — R739 Hyperglycemia, unspecified: Secondary | ICD-10-CM

## 2022-05-04 DIAGNOSIS — E038 Other specified hypothyroidism: Secondary | ICD-10-CM | POA: Diagnosis not present

## 2022-05-04 DIAGNOSIS — E782 Mixed hyperlipidemia: Secondary | ICD-10-CM | POA: Diagnosis not present

## 2022-05-04 LAB — COMPREHENSIVE METABOLIC PANEL
ALT: 27 U/L (ref 0–53)
AST: 37 U/L (ref 0–37)
Albumin: 4.3 g/dL (ref 3.5–5.2)
Alkaline Phosphatase: 44 U/L (ref 39–117)
BUN: 15 mg/dL (ref 6–23)
CO2: 28 mEq/L (ref 19–32)
Calcium: 9.7 mg/dL (ref 8.4–10.5)
Chloride: 105 mEq/L (ref 96–112)
Creatinine, Ser: 0.98 mg/dL (ref 0.40–1.50)
GFR: 75.6 mL/min (ref >60.00)
Glucose, Bld: 91 mg/dL (ref 70–99)
Potassium: 4.2 mEq/L (ref 3.5–5.1)
Sodium: 140 mEq/L (ref 135–145)
Total Bilirubin: 0.4 mg/dL (ref 0.2–1.2)
Total Protein: 7.1 g/dL (ref 6.0–8.3)

## 2022-05-04 LAB — CBC WITH DIFFERENTIAL/PLATELET
Basophils Absolute: 0.1 10*3/uL (ref 0.0–0.1)
Basophils Relative: 1 % (ref 0.0–3.0)
Eosinophils Absolute: 0.2 10*3/uL (ref 0.0–0.7)
Eosinophils Relative: 2.8 % (ref 0.0–5.0)
HCT: 42.6 % (ref 39.0–52.0)
Hemoglobin: 14.4 g/dL (ref 13.0–17.0)
Lymphocytes Relative: 23.5 % (ref 12.0–46.0)
Lymphs Abs: 1.5 10*3/uL (ref 0.7–4.0)
MCHC: 33.9 g/dL (ref 30.0–36.0)
MCV: 97.9 fl (ref 78.0–100.0)
Monocytes Absolute: 1 10*3/uL (ref 0.1–1.0)
Monocytes Relative: 15.9 % — ABNORMAL HIGH (ref 3.0–12.0)
Neutro Abs: 3.6 10*3/uL (ref 1.4–7.7)
Neutrophils Relative %: 56.8 % (ref 43.0–77.0)
Platelets: 250 10*3/uL (ref 150.0–400.0)
RBC: 4.35 Mil/uL (ref 4.22–5.81)
RDW: 13.8 % (ref 11.5–15.5)
WBC: 6.3 10*3/uL (ref 4.0–10.5)

## 2022-05-04 LAB — HEPATIC FUNCTION PANEL
ALT: 27 U/L (ref 0–53)
AST: 37 U/L (ref 0–37)
Albumin: 4.3 g/dL (ref 3.5–5.2)
Alkaline Phosphatase: 44 U/L (ref 39–117)
Bilirubin, Direct: 0.1 mg/dL (ref 0.0–0.3)
Total Bilirubin: 0.4 mg/dL (ref 0.2–1.2)
Total Protein: 7.1 g/dL (ref 6.0–8.3)

## 2022-05-04 LAB — LIPID PANEL
Cholesterol: 159 mg/dL (ref 0–200)
HDL: 72.3 mg/dL (ref >39.00)
LDL Cholesterol: 75 mg/dL (ref 0–99)
NonHDL: 86.95
Total CHOL/HDL Ratio: 2
Triglycerides: 62 mg/dL (ref 0.0–149.0)
VLDL: 12.4 mg/dL (ref 0.0–40.0)

## 2022-05-04 LAB — HEMOGLOBIN A1C: Hgb A1c MFr Bld: 5.7 % (ref 4.6–6.5)

## 2022-05-04 LAB — TSH: TSH: 1.6 u[IU]/mL (ref 0.35–5.50)

## 2022-05-07 ENCOUNTER — Encounter: Payer: Self-pay | Admitting: Internal Medicine

## 2022-05-11 DIAGNOSIS — M545 Low back pain, unspecified: Secondary | ICD-10-CM | POA: Diagnosis not present

## 2022-05-26 DIAGNOSIS — M545 Low back pain, unspecified: Secondary | ICD-10-CM | POA: Diagnosis not present

## 2022-06-08 ENCOUNTER — Encounter: Payer: Self-pay | Admitting: Internal Medicine

## 2022-06-09 DIAGNOSIS — M545 Low back pain, unspecified: Secondary | ICD-10-CM | POA: Diagnosis not present

## 2022-06-23 DIAGNOSIS — M545 Low back pain, unspecified: Secondary | ICD-10-CM | POA: Diagnosis not present

## 2022-07-14 DIAGNOSIS — M545 Low back pain, unspecified: Secondary | ICD-10-CM | POA: Diagnosis not present

## 2022-07-29 ENCOUNTER — Encounter: Payer: Self-pay | Admitting: Internal Medicine

## 2022-07-29 ENCOUNTER — Ambulatory Visit (INDEPENDENT_AMBULATORY_CARE_PROVIDER_SITE_OTHER): Payer: PPO | Admitting: Internal Medicine

## 2022-07-29 ENCOUNTER — Ambulatory Visit (INDEPENDENT_AMBULATORY_CARE_PROVIDER_SITE_OTHER): Payer: PPO

## 2022-07-29 VITALS — BP 126/78 | HR 55 | Temp 98.0°F | Ht 67.0 in | Wt 152.0 lb

## 2022-07-29 DIAGNOSIS — M79602 Pain in left arm: Secondary | ICD-10-CM | POA: Diagnosis not present

## 2022-07-29 DIAGNOSIS — E782 Mixed hyperlipidemia: Secondary | ICD-10-CM

## 2022-07-29 DIAGNOSIS — M5412 Radiculopathy, cervical region: Secondary | ICD-10-CM | POA: Insufficient documentation

## 2022-07-29 DIAGNOSIS — R202 Paresthesia of skin: Secondary | ICD-10-CM | POA: Diagnosis not present

## 2022-07-29 NOTE — Progress Notes (Signed)
Subjective:    Patient ID: Jeremy Waters, male    DOB: 11-01-1945, 77 y.o.   MRN: [id number]      HPI Law is here for  Chief Complaint  Patient presents with   Numbness    Left arm and  hand numbness    Left hand/arm N/T x several days - he noticed when he was driving the other day.  It is an ache in the left upper lateral arm and left whom hand tingling.  No unusual activities/injuries.  No neck pain. The arm feels weak.     Exercises regularly - elliptical  - will have symptoms when he uses the arms of the elliptical machine - does not feel it when he does not use the arms and just does the legs.  Has history of cervical radiculopathy.  Reviewed MRI of cervical spine from 2018  Medications and allergies reviewed with patient and updated if appropriate.  Current Outpatient Medications on File Prior to Visit  Medication Sig Dispense Refill   beta carotene 25000 UNIT capsule Take 25,000 Units by mouth daily.     clonazePAM (KLONOPIN) 0.5 MG tablet TAKE ONE TABLET BY MOUTH AT BEDTIME AS NEEDED 30 tablet 0   fluticasone (FLONASE) 50 MCG/ACT nasal spray PLACE 1 SPRAY INTO BOTH NOSTRILS DAILY 48 mL 2   glycopyrrolate (ROBINUL) 2 MG tablet Take 1 tablet (2 mg total) by mouth every morning. 90 tablet 0   ibuprofen (ADVIL) 200 MG tablet Take 200 mg by mouth every 6 (six) hours as needed.     levothyroxine (SYNTHROID) 50 MCG tablet Take 1 tablet by mouth daily 90 tablet 2   pyridOXINE (VITAMIN B-6) 100 MG tablet Take 100 mg by mouth daily.     rosuvastatin (CRESTOR) 40 MG tablet Take 1 tablet (40 mg total) by mouth daily. 30 tablet 5   vitamin C (ASCORBIC ACID) 500 MG tablet Take 500 mg by mouth 2 (two) times daily.     vitamin E 400 UNIT capsule Take 400 Units by mouth daily.     No current facility-administered medications on file prior to visit.    Review of Systems     Objective:   Vitals:   07/29/22 0747  BP: 126/78  Pulse: (!) 55  Temp: 98 F (36.7 C)   SpO2: 98%   BP Readings from Last 3 Encounters:  07/29/22 126/78  03/18/22 134/78  03/18/22 134/78   Wt Readings from Last 3 Encounters:  07/29/22 152 lb (68.9 kg)  03/18/22 156 lb 3.2 oz (70.9 kg)  03/18/22 156 lb 3.2 oz (70.9 kg)   Body mass index is 23.81 kg/m.    Physical Exam Constitutional:      General: He is not in acute distress.    Appearance: Normal appearance. He is not ill-appearing.  HENT:     Head: Normocephalic and atraumatic.  Cardiovascular:     Rate and Rhythm: Normal rate and regular rhythm.  Pulmonary:     Effort: Pulmonary effort is normal.     Breath sounds: Normal breath sounds.  Musculoskeletal:     Comments: No tenderness in posterior neck, upper back, good ROM.  Mild tenderness with palpation of left upper arm.  Normal ROM of left shoulder with some increase in achiness of the left upper arm  Skin:    General: Skin is warm and dry.     Findings: No rash.  Neurological:     Sensory: No sensory deficit.  Motor: No weakness.       MRI cervical spine without contrast 03/20/2017: At that time was having right-sided neck pain with radiculopathy Impression:  Multilevel spondylosis as described. Potentially symptomatic RIGHT-sided neural impingement C4-5, C5-6, and/or C6-7.   Stenosis at C5-6 with mild cord flattening, but no abnormal cord signal.  There is no change compared to previous EKG from 03/11/2021  EKG: Sinus bradycardia at 47 bpm, incomplete RBBB   Assessment & Plan:    See Problem List for Assessment and Plan of chronic medical problems.

## 2022-07-29 NOTE — Assessment & Plan Note (Addendum)
Acute Started a several days ago Occurs at rest and with using the arms when he does the elliptical ( not when he does not use the arms) Associated with left hand tingling and subjective arm weakness - no neck pain No chest pain, SOB H/o cervical radiculopathy and it sounds like this may be radiculopathy Symptoms much less likely cardiac in nature-no chest pain, shortness of breath and this occurs more at rest than with activity Will do an EKG to be on the safe side given age, hyperlipidemia If pain continues or worsens he will call minute me or be seen in the emergency room

## 2022-07-29 NOTE — Assessment & Plan Note (Signed)
Chronic Cholesterol has been well-controlled Continue Crestor 40 mg daily EKG today

## 2022-07-29 NOTE — Patient Instructions (Addendum)
      EKG  was done today   Have an xray downstairs.     Medications changes include :  none    Let me know if your symptoms do not improve with PT.     Return if symptoms worsen or fail to improve.

## 2022-07-29 NOTE — Assessment & Plan Note (Signed)
New For the past several days has been having upper arm ache/discomfort Left hand-symptoms intermittent No neck pain Not related to activity-does occur when he is on the elliptical, but better if he does not use his arms and does occur at rest, no chest pain, shortness of breath Symptoms likely musculoskeletal-cervical radiculopathy X-ray of cervical spine today Will be seeing physical therapy next week and will start physical therapy for this If no improvement with PT we will consider MRI

## 2022-08-04 DIAGNOSIS — M542 Cervicalgia: Secondary | ICD-10-CM | POA: Diagnosis not present

## 2022-08-04 DIAGNOSIS — M545 Low back pain, unspecified: Secondary | ICD-10-CM | POA: Diagnosis not present

## 2022-08-07 ENCOUNTER — Other Ambulatory Visit: Payer: Self-pay | Admitting: Internal Medicine

## 2022-08-11 ENCOUNTER — Other Ambulatory Visit: Payer: Self-pay

## 2022-08-11 MED ORDER — GLYCOPYRROLATE 2 MG PO TABS: 2.0000 mg | ORAL_TABLET | Freq: Every morning | ORAL | 0 refills | Status: DC

## 2022-08-15 ENCOUNTER — Inpatient Hospital Stay (HOSPITAL_BASED_OUTPATIENT_CLINIC_OR_DEPARTMENT_OTHER)
Admission: EM | Admit: 2022-08-15 | Discharge: 2022-08-26 | DRG: 216 | Disposition: A | Discharge status: Home / Self Care | Payer: PPO | Attending: Cardiothoracic Surgery | Admitting: Cardiothoracic Surgery

## 2022-08-15 ENCOUNTER — Encounter (HOSPITAL_BASED_OUTPATIENT_CLINIC_OR_DEPARTMENT_OTHER): Payer: Self-pay

## 2022-08-15 ENCOUNTER — Other Ambulatory Visit: Payer: Self-pay

## 2022-08-15 ENCOUNTER — Emergency Department (HOSPITAL_BASED_OUTPATIENT_CLINIC_OR_DEPARTMENT_OTHER): Payer: PPO

## 2022-08-15 DIAGNOSIS — I959 Hypotension, unspecified: Secondary | ICD-10-CM | POA: Diagnosis not present

## 2022-08-15 DIAGNOSIS — Z951 Presence of aortocoronary bypass graft: Secondary | ICD-10-CM | POA: Diagnosis not present

## 2022-08-15 DIAGNOSIS — R011 Cardiac murmur, unspecified: Secondary | ICD-10-CM | POA: Diagnosis not present

## 2022-08-15 DIAGNOSIS — D62 Acute posthemorrhagic anemia: Secondary | ICD-10-CM | POA: Diagnosis not present

## 2022-08-15 DIAGNOSIS — F1090 Alcohol use, unspecified, uncomplicated: Secondary | ICD-10-CM | POA: Diagnosis present

## 2022-08-15 DIAGNOSIS — T502X5A Adverse effect of carbonic-anhydrase inhibitors, benzothiadiazides and other diuretics, initial encounter: Secondary | ICD-10-CM | POA: Diagnosis not present

## 2022-08-15 DIAGNOSIS — E785 Hyperlipidemia, unspecified: Secondary | ICD-10-CM | POA: Diagnosis not present

## 2022-08-15 DIAGNOSIS — I4519 Other right bundle-branch block: Secondary | ICD-10-CM | POA: Diagnosis not present

## 2022-08-15 DIAGNOSIS — T8110XA Postprocedural shock unspecified, initial encounter: Secondary | ICD-10-CM | POA: Diagnosis not present

## 2022-08-15 DIAGNOSIS — J986 Disorders of diaphragm: Secondary | ICD-10-CM | POA: Diagnosis present

## 2022-08-15 DIAGNOSIS — I350 Nonrheumatic aortic (valve) stenosis: Secondary | ICD-10-CM | POA: Diagnosis not present

## 2022-08-15 DIAGNOSIS — Z8616 Personal history of COVID-19: Secondary | ICD-10-CM | POA: Diagnosis not present

## 2022-08-15 DIAGNOSIS — N179 Acute kidney failure, unspecified: Secondary | ICD-10-CM | POA: Diagnosis not present

## 2022-08-15 DIAGNOSIS — G4700 Insomnia, unspecified: Secondary | ICD-10-CM | POA: Diagnosis not present

## 2022-08-15 DIAGNOSIS — K4090 Unilateral inguinal hernia, without obstruction or gangrene, not specified as recurrent: Secondary | ICD-10-CM | POA: Diagnosis present

## 2022-08-15 DIAGNOSIS — I214 Non-ST elevation (NSTEMI) myocardial infarction: Secondary | ICD-10-CM | POA: Diagnosis not present

## 2022-08-15 DIAGNOSIS — Y839 Surgical procedure, unspecified as the cause of abnormal reaction of the patient, or of later complication, without mention of misadventure at the time of the procedure: Secondary | ICD-10-CM | POA: Diagnosis not present

## 2022-08-15 DIAGNOSIS — D6959 Other secondary thrombocytopenia: Secondary | ICD-10-CM | POA: Diagnosis not present

## 2022-08-15 DIAGNOSIS — E039 Hypothyroidism, unspecified: Secondary | ICD-10-CM | POA: Diagnosis present

## 2022-08-15 DIAGNOSIS — J9690 Respiratory failure, unspecified, unspecified whether with hypoxia or hypercapnia: Secondary | ICD-10-CM | POA: Diagnosis not present

## 2022-08-15 DIAGNOSIS — R0602 Shortness of breath: Secondary | ICD-10-CM | POA: Diagnosis not present

## 2022-08-15 DIAGNOSIS — E876 Hypokalemia: Secondary | ICD-10-CM | POA: Diagnosis present

## 2022-08-15 DIAGNOSIS — Z954 Presence of other heart-valve replacement: Secondary | ICD-10-CM | POA: Diagnosis not present

## 2022-08-15 DIAGNOSIS — Z79899 Other long term (current) drug therapy: Secondary | ICD-10-CM

## 2022-08-15 DIAGNOSIS — Z96652 Presence of left artificial knee joint: Secondary | ICD-10-CM | POA: Diagnosis present

## 2022-08-15 DIAGNOSIS — Z888 Allergy status to other drugs, medicaments and biological substances status: Secondary | ICD-10-CM

## 2022-08-15 DIAGNOSIS — K5900 Constipation, unspecified: Secondary | ICD-10-CM | POA: Diagnosis not present

## 2022-08-15 DIAGNOSIS — R918 Other nonspecific abnormal finding of lung field: Secondary | ICD-10-CM | POA: Diagnosis not present

## 2022-08-15 DIAGNOSIS — H903 Sensorineural hearing loss, bilateral: Secondary | ICD-10-CM | POA: Diagnosis present

## 2022-08-15 DIAGNOSIS — Z8546 Personal history of malignant neoplasm of prostate: Secondary | ICD-10-CM

## 2022-08-15 DIAGNOSIS — F17290 Nicotine dependence, other tobacco product, uncomplicated: Secondary | ICD-10-CM | POA: Diagnosis not present

## 2022-08-15 DIAGNOSIS — J90 Pleural effusion, not elsewhere classified: Secondary | ICD-10-CM | POA: Diagnosis not present

## 2022-08-15 DIAGNOSIS — I358 Other nonrheumatic aortic valve disorders: Secondary | ICD-10-CM | POA: Diagnosis present

## 2022-08-15 DIAGNOSIS — Z952 Presence of prosthetic heart valve: Secondary | ICD-10-CM | POA: Diagnosis not present

## 2022-08-15 DIAGNOSIS — I4891 Unspecified atrial fibrillation: Secondary | ICD-10-CM | POA: Diagnosis not present

## 2022-08-15 DIAGNOSIS — E871 Hypo-osmolality and hyponatremia: Secondary | ICD-10-CM | POA: Diagnosis not present

## 2022-08-15 DIAGNOSIS — Z87891 Personal history of nicotine dependence: Secondary | ICD-10-CM | POA: Diagnosis not present

## 2022-08-15 DIAGNOSIS — Z9911 Dependence on respirator [ventilator] status: Secondary | ICD-10-CM | POA: Diagnosis not present

## 2022-08-15 DIAGNOSIS — I252 Old myocardial infarction: Secondary | ICD-10-CM | POA: Diagnosis not present

## 2022-08-15 DIAGNOSIS — I2510 Atherosclerotic heart disease of native coronary artery without angina pectoris: Secondary | ICD-10-CM | POA: Diagnosis present

## 2022-08-15 DIAGNOSIS — Z79890 Hormone replacement therapy (postmenopausal): Secondary | ICD-10-CM

## 2022-08-15 DIAGNOSIS — I083 Combined rheumatic disorders of mitral, aortic and tricuspid valves: Secondary | ICD-10-CM | POA: Diagnosis not present

## 2022-08-15 DIAGNOSIS — M1990 Unspecified osteoarthritis, unspecified site: Secondary | ICD-10-CM | POA: Diagnosis present

## 2022-08-15 DIAGNOSIS — I480 Paroxysmal atrial fibrillation: Secondary | ICD-10-CM | POA: Diagnosis present

## 2022-08-15 DIAGNOSIS — I213 ST elevation (STEMI) myocardial infarction of unspecified site: Secondary | ICD-10-CM | POA: Diagnosis not present

## 2022-08-15 DIAGNOSIS — D696 Thrombocytopenia, unspecified: Secondary | ICD-10-CM | POA: Diagnosis not present

## 2022-08-15 DIAGNOSIS — J95821 Acute postprocedural respiratory failure: Secondary | ICD-10-CM | POA: Diagnosis not present

## 2022-08-15 DIAGNOSIS — E8770 Fluid overload, unspecified: Secondary | ICD-10-CM | POA: Diagnosis not present

## 2022-08-15 DIAGNOSIS — R600 Localized edema: Secondary | ICD-10-CM

## 2022-08-15 DIAGNOSIS — Z9079 Acquired absence of other genital organ(s): Secondary | ICD-10-CM

## 2022-08-15 DIAGNOSIS — D649 Anemia, unspecified: Secondary | ICD-10-CM | POA: Diagnosis not present

## 2022-08-15 DIAGNOSIS — Z8042 Family history of malignant neoplasm of prostate: Secondary | ICD-10-CM

## 2022-08-15 DIAGNOSIS — Z7141 Alcohol abuse counseling and surveillance of alcoholic: Secondary | ICD-10-CM

## 2022-08-15 DIAGNOSIS — I700 Atherosclerosis of aorta: Secondary | ICD-10-CM | POA: Diagnosis not present

## 2022-08-15 DIAGNOSIS — R197 Diarrhea, unspecified: Secondary | ICD-10-CM | POA: Diagnosis not present

## 2022-08-15 DIAGNOSIS — J9811 Atelectasis: Secondary | ICD-10-CM | POA: Diagnosis not present

## 2022-08-15 DIAGNOSIS — K219 Gastro-esophageal reflux disease without esophagitis: Secondary | ICD-10-CM | POA: Diagnosis present

## 2022-08-15 DIAGNOSIS — Z8719 Personal history of other diseases of the digestive system: Secondary | ICD-10-CM

## 2022-08-15 DIAGNOSIS — R079 Chest pain, unspecified: Secondary | ICD-10-CM | POA: Diagnosis not present

## 2022-08-15 DIAGNOSIS — R001 Bradycardia, unspecified: Secondary | ICD-10-CM | POA: Diagnosis not present

## 2022-08-15 DIAGNOSIS — Z01810 Encounter for preprocedural cardiovascular examination: Secondary | ICD-10-CM | POA: Diagnosis not present

## 2022-08-15 LAB — CBC WITH DIFFERENTIAL/PLATELET
Abs Immature Granulocytes: 0.03 10*3/uL (ref 0.00–0.07)
Basophils Absolute: 0.1 10*3/uL (ref 0.0–0.1)
Basophils Relative: 1 %
Eosinophils Absolute: 0.1 10*3/uL (ref 0.0–0.5)
Eosinophils Relative: 1 %
HCT: 42.8 % (ref 39.0–52.0)
Hemoglobin: 14.6 g/dL (ref 13.0–17.0)
Immature Granulocytes: 0 %
Lymphocytes Relative: 21 %
Lymphs Abs: 1.8 10*3/uL (ref 0.7–4.0)
MCH: 32.9 pg (ref 26.0–34.0)
MCHC: 34.1 g/dL (ref 30.0–36.0)
MCV: 96.4 fL (ref 80.0–100.0)
Monocytes Absolute: 1.1 10*3/uL — ABNORMAL HIGH (ref 0.1–1.0)
Monocytes Relative: 14 %
Neutro Abs: 5.4 10*3/uL (ref 1.7–7.7)
Neutrophils Relative %: 63 %
Platelets: 262 10*3/uL (ref 150–400)
RBC: 4.44 MIL/uL (ref 4.22–5.81)
RDW: 14.1 % (ref 11.5–15.5)
WBC: 8.5 10*3/uL (ref 4.0–10.5)
nRBC: 0 % (ref 0.0–0.2)

## 2022-08-15 LAB — TROPONIN I (HIGH SENSITIVITY)
Troponin I (High Sensitivity): 581 ng/L (ref <18)
Troponin I (High Sensitivity): 1032 ng/L (ref <18)
Troponin I (High Sensitivity): 1518 ng/L (ref <18)
Troponin I (High Sensitivity): 2023 ng/L (ref <18)

## 2022-08-15 LAB — COMPREHENSIVE METABOLIC PANEL
ALT: 24 U/L (ref 0–44)
AST: 44 U/L — ABNORMAL HIGH (ref 15–41)
Albumin: 4.5 g/dL (ref 3.5–5.0)
Alkaline Phosphatase: 38 U/L (ref 38–126)
Anion gap: 9 (ref 5–15)
BUN: 14 mg/dL (ref 8–23)
CO2: 25 mmol/L (ref 22–32)
Calcium: 10.3 mg/dL (ref 8.9–10.3)
Chloride: 105 mmol/L (ref 98–111)
Creatinine, Ser: 0.89 mg/dL (ref 0.61–1.24)
GFR, Estimated: >60 mL/min (ref >60)
Glucose, Bld: 106 mg/dL — ABNORMAL HIGH (ref 70–99)
Potassium: 3.9 mmol/L (ref 3.5–5.1)
Sodium: 139 mmol/L (ref 135–145)
Total Bilirubin: 0.5 mg/dL (ref 0.3–1.2)
Total Protein: 7.2 g/dL (ref 6.5–8.1)

## 2022-08-15 LAB — TSH: TSH: 1.348 u[IU]/mL (ref 0.350–4.500)

## 2022-08-15 LAB — T4, FREE: Free T4: 0.89 ng/dL (ref 0.61–1.12)

## 2022-08-15 LAB — MAGNESIUM: Magnesium: 2.2 mg/dL (ref 1.7–2.4)

## 2022-08-15 LAB — BRAIN NATRIURETIC PEPTIDE: B Natriuretic Peptide: 352.2 pg/mL — ABNORMAL HIGH (ref 0.0–100.0)

## 2022-08-15 MED ORDER — METOPROLOL TARTRATE 5 MG/5ML IV SOLN
2.5000 mg | Freq: Once | INTRAVENOUS | Status: AC
  Administered 2022-08-15: 2.5 mg via INTRAVENOUS
  Filled 2022-08-15: qty 5

## 2022-08-15 MED ORDER — ONDANSETRON HCL 4 MG/2ML IJ SOLN: 4.0000 mg | Freq: Four times a day (QID) | INTRAMUSCULAR | Status: DC | PRN

## 2022-08-15 MED ORDER — ASPIRIN 81 MG PO CHEW
324.0000 mg | CHEWABLE_TABLET | Freq: Once | ORAL | Status: AC
  Administered 2022-08-15: 324 mg via ORAL
  Filled 2022-08-15: qty 4

## 2022-08-15 MED ORDER — LACTATED RINGERS IV BOLUS
1000.0000 mL | Freq: Once | INTRAVENOUS | Status: AC
  Administered 2022-08-15: 1000 mL via INTRAVENOUS

## 2022-08-15 MED ORDER — LEVOTHYROXINE SODIUM 50 MCG PO TABS
50.0000 ug | ORAL_TABLET | Freq: Every day | ORAL | Status: DC
  Administered 2022-08-16 – 2022-08-17 (×2): 50 ug via ORAL
  Filled 2022-08-15 (×2): qty 1

## 2022-08-15 MED ORDER — ROSUVASTATIN CALCIUM 20 MG PO TABS
40.0000 mg | ORAL_TABLET | Freq: Every day | ORAL | Status: DC
  Administered 2022-08-15 – 2022-08-17 (×3): 40 mg via ORAL
  Filled 2022-08-15 (×3): qty 2

## 2022-08-15 MED ORDER — METOPROLOL TARTRATE 25 MG PO TABS
25.0000 mg | ORAL_TABLET | Freq: Four times a day (QID) | ORAL | Status: DC
  Administered 2022-08-15 – 2022-08-17 (×8): 25 mg via ORAL
  Filled 2022-08-15 (×9): qty 1

## 2022-08-15 MED ORDER — ASPIRIN 81 MG PO TBEC
81.0000 mg | DELAYED_RELEASE_TABLET | Freq: Every day | ORAL | Status: DC
  Administered 2022-08-16: 81 mg via ORAL
  Filled 2022-08-15: qty 1

## 2022-08-15 MED ORDER — METOPROLOL TARTRATE 25 MG PO TABS: 25.0000 mg | ORAL_TABLET | Freq: Two times a day (BID) | ORAL | Status: DC

## 2022-08-15 MED ORDER — NITROGLYCERIN 0.4 MG SL SUBL: 0.4000 mg | SUBLINGUAL_TABLET | SUBLINGUAL | Status: DC | PRN

## 2022-08-15 MED ORDER — HEPARIN (PORCINE) 25000 UT/250ML-% IV SOLN
900.0000 [IU]/h | INTRAVENOUS | Status: DC
  Administered 2022-08-15: 750 [IU]/h via INTRAVENOUS
  Administered 2022-08-16: 900 [IU]/h via INTRAVENOUS
  Filled 2022-08-15 (×2): qty 250

## 2022-08-15 MED ORDER — HEPARIN BOLUS VIA INFUSION
3900.0000 [IU] | Freq: Once | INTRAVENOUS | Status: AC
  Administered 2022-08-15: 3900 [IU] via INTRAVENOUS

## 2022-08-15 MED ORDER — ACETAMINOPHEN 325 MG PO TABS: 650.0000 mg | ORAL_TABLET | ORAL | Status: DC | PRN

## 2022-08-15 NOTE — ED Notes (Signed)
CRITICAL VALUE STICKER  CRITICAL VALUE:  RECEIVER (on-site recipient of call):Clothilde Tippetts rn  DATE & TIME NOTIFIED:  08/15/2022 1740 MESSENGER (representative from lab):  MD NOTIFIED: Gareth Morgan  TIME OF NOTIFICATION:1740  RESPONSE:

## 2022-08-15 NOTE — Progress Notes (Signed)
Date and time results received: 08/15/22 2135 (use smartphrase ".now" to insert current time)  Test: troponin  Critical Value: 1518  Name of Provider Notified: Clayborn Bigness MD  Orders Received? Or Actions Taken?:  none

## 2022-08-15 NOTE — Plan of Care (Signed)
  Problem: Education: Goal: Knowledge of General Education information will improve Description Including pain rating scale, medication(s)/side effects and non-pharmacologic comfort measures Outcome: Progressing   

## 2022-08-15 NOTE — ED Notes (Signed)
[facility] called to arrange transport to [facility].

## 2022-08-15 NOTE — ED Provider Notes (Signed)
Jeremy Waters   CSN: [id number] Arrival date & time: 08/15/22  1609     History {Add pertinent medical, surgical, social history, OB history to HPI:1} No chief complaint on file.   Jeremy Waters is a 77 y.o. male.  HPI     77 year old male with a history of smoking, prostate cancer and prostatectomy status post TURP no radiation, hyperlipidemia, prior cardiology visit for PVCs, right bundle branch block who presents with concern for atrial fibrillation noted on apple watch.  10 days has had chest, shoulder aching and tingling in left arm Saw Dr. Quay Waters, thought maybe pinched nerve Chest and shoulder aching coming and going Does not appear to be brough on by exercising, seems more random Thursday as more severe, ended up walking 18 holes anyway and it felt better Watch today said afib but did not say it otherwise Maybe a little more run down today  No dyspnea, nausea, vomiting Felt some lightheadedness today No leg pain or swelling  No hx of dvt/pe, no recent trips, surgeries  No hx of afib before  Occasional cigar, cocktails in evening, no other drug use.  Birthday dinner, cocktails.  Past Medical History:  Diagnosis Date   Adenomatous colon polyp 09/2007   Arthritis    OA / PAIN LEFT KNEE   Cancer ([place])    prostate cancer   Cataract    removed both eyes   COVID-19 virus infection    Diverticulosis of colon    w/o hemorrage   Heart murmur    TOLD HE HAS A SLIGHT MURMUR   Hyperlipidemia    Inguinal hernia    left side    Thyroid disease     Home Medications Prior to Admission medications   Medication Sig Start Date End Date Taking? Authorizing Provider  beta carotene 25000 UNIT capsule Take 25,000 Units by mouth daily.    [provider]  clonazePAM (KLONOPIN) 0.5 MG tablet TAKE ONE TABLET BY MOUTH AT BEDTIME AS NEEDED 09/07/21   Jeremy Rail, MD  fluticasone [facility]) 50 MCG/ACT nasal  spray SPRAY ONE SPRAY IN EACH NOSTRIL ONCE DAILY 08/09/22   Jeremy Rail, MD  glycopyrrolate (ROBINUL) 2 MG tablet Take 1 tablet (2 mg total) by mouth every morning. 08/11/22   Esterwood, Amy S, PA-C  ibuprofen (ADVIL) 200 MG tablet Take 200 mg by mouth every 6 (six) hours as needed.    [provider]  levothyroxine (SYNTHROID) 50 MCG tablet Take 1 tablet by mouth daily 02/03/22   Jeremy Rail, MD  pyridOXINE (VITAMIN B-6) 100 MG tablet Take 100 mg by mouth daily.    [provider]  rosuvastatin (CRESTOR) 40 MG tablet Take 1 tablet (40 mg total) by mouth daily. 03/18/22   Jeremy Rail, MD  vitamin C (ASCORBIC ACID) 500 MG tablet Take 500 mg by mouth 2 (two) times daily.    [provider]  vitamin E 400 UNIT capsule Take 400 Units by mouth daily.    [provider]      Allergies    Atorvastatin    Review of Systems   Review of Systems  Physical Exam Updated Vital Signs BP 132/88 (BP Location: Right Arm)   Pulse (!) 114   Temp 98.8 F (37.1 C) (Oral)   Resp 14   Ht 5\' 7"  (1.702 m)   Wt 65.8 kg   SpO2 96%   BMI 22.71 kg/m  Physical Exam  ED Results / Procedures / Treatments   Labs (all labs ordered are listed, but only abnormal results are displayed) Labs Reviewed - No data to display  EKG EKG Interpretation  Date/Time:  Sunday August 15 2022 16:17:40 EDT Ventricular Rate:  140 PR Interval:    QRS Duration: 106 QT Interval:  314 QTC Calculation: 480 R Axis:   14 Text Interpretation: Atrial fibrillation RSR' in V1 or V2, right VCD or RVH Borderline prolonged QT interval Since prior ECG, rate has increased, atrial fibrillation is new Confirmed by Jeremy Waters [phone]) on 08/15/2022 4:26:45 PM  Radiology No results found.  Procedures Procedures  {Document cardiac monitor, telemetry assessment procedure when appropriate:1}  Medications Ordered in ED Medications - No data to display  ED Course/ Medical Decision Making/ A&P    {   Click here for ABCD2, HEART and other calculatorsREFRESH Waters before signing :1}                          Medical Decision Making Amount and/or Complexity of Data Reviewed Labs: ordered. Radiology: ordered.   ***  {Document critical care time when appropriate:1} {Document review of labs and clinical decision tools ie heart score, Chads2Vasc2 etc:1}  {Document your independent review of radiology images, and any outside records:1} {Document your discussion with family members, caretakers, and with consultants:1} {Document social determinants of health affecting pt'Waters care:1} {Document your decision making why or why not admission, treatments were needed:1} Final Clinical Impression(Waters) / ED Diagnoses Final diagnoses:  None    Rx / DC Orders ED Discharge Orders     None

## 2022-08-15 NOTE — ED Triage Notes (Signed)
Pt reports watching tv. Pt apple watch alerted him that he was in A.fib. Pt reports never being in A/ Fib before. Last 10 days pain off/on left arm. Denies CP. Slightly increased SOB from baseline SOB.

## 2022-08-15 NOTE — H&P (Addendum)
Cardiology Admission History and Physical   Patient ID: FAWWAZ LOSCO MRN: [id number]; DOB: [birth year]   Admission date: 08/15/2022  PCP:  Binnie Rail, MD   [place] Providers Cardiologist:  None        Chief Complaint:  abnormal ECG  Patient Profile:   Jeremy Waters is a 77 y.o. male with past medical history of hypothyroidism, carotid disease, prostate cancer status post TURP, history of symptomatic PVCs in 2022 following with Dr. Harl Bowie, mild aortic stenosis who is being seen 08/15/2022 for the evaluation of atrial fibrillation.  History of Present Illness:   Jeremy Waters was in his usual state of health up until earlier today while he was watching TV and his Apple Watch sent him a notification saying he was on A-fib.  He reviewed his health app and it seems that he had been on A-fib on and off over a few hours.  Patient did not have any symptoms.  Because of this reason he decided to go to the emergency department.  Additionally, he has been complaining of intermittent left shoulder/arm pain for the past 10 days.  This is nonexertional, predominantly in the morning, lasting between 30 min to 1 hr with no associated symptoms no particular relievers but that improved somewhat with physical therapy.  He thought it was a musculoskeletal, specially given its improvement with PT.  Patient is physically active even though this 10-day period where he was having intermittent episodes of shoulder pain he was still exercising about 35 minutes in the elliptical in the morning without triggering/exacerbating his shoulder symptoms and with no decrease in his exercise capacity. He denies syncope or presyncope, no palpitations, no orthopnea PND or lower extremity edema, no shortness of breath.  Upon arrival to the emergency department at [place] his heart rate was between 100-130 bpm, in atrial fibrillation.  Respiratory rate 14, saturating 97% on room air and blood pressure  132/88 mmHg.  Lab workup relevant for normal hemoglobin and creatinine, BNP of 352, troponin of 581 that up trended to 1000 and normal TSH.  Patient received 324 of aspirin started on heparin drip and was transferred for further care  Upon arrival, patient asymptomatic and heart rates between 110-120 bpm.   Past Medical History:  Diagnosis Date   Adenomatous colon polyp 09/2007   Arthritis    OA / PAIN LEFT KNEE   Cancer ([place])    prostate cancer   Cataract    removed both eyes   COVID-19 virus infection    Diverticulosis of colon    w/o hemorrage   Heart murmur    TOLD HE HAS A SLIGHT MURMUR   Hyperlipidemia    Inguinal hernia    left side    Thyroid disease     Past Surgical History:  Procedure Laterality Date   BACK SURGERY  05/12/2005   L5   COLONOSCOPY  1999   Negative; Dr Fuller Plan   COLONOSCOPY  2004   tics, hemorrhoids   COLONOSCOPY  2009   polyps (T[place]A.)   [place]  10/2003   KNEE SURGERY  1969   Patellar fracture fragment Lt   LAMINECTOMY  2000   L4-5   LUMBAR LAMINECTOMY/DECOMPRESSION MICRODISCECTOMY Right 10/08/2015   Procedure: MICRO LUMBAR DECOMPRESSION L4-L5 AND L5-S1 ON RIGHT ;  Surgeon: Susa Day, MD;  Location: [facility];  Service: Orthopedics;  Laterality: Right;   MASS EXCISION  04/26/2011   Procedure:  MINOR EXCISION OF MASS;  Surgeon: Earnstine Regal, MD;  Location: [facility];  Service: General;  Laterality: Right;  Excise subcutaneous mass right axilla Minor Room   POLYPECTOMY     PROSTATECTOMY  04/16/2005   TONSILLECTOMY     TOTAL KNEE ARTHROPLASTY Left 07/03/2013   Procedure: LEFT TOTAL KNEE ARTHROPLASTY;  Surgeon: Mauri Pole, MD;  Location: [facility];  Service: Orthopedics;  Laterality: Left;     Medications Prior to Admission: Prior to Admission medications   Medication Sig Start Date End Date Taking? Authorizing Provider  beta carotene 25000 UNIT capsule Take 25,000 Units by mouth daily.     [provider]  clonazePAM (KLONOPIN) 0.5 MG tablet TAKE ONE TABLET BY MOUTH AT BEDTIME AS NEEDED 09/07/21   Binnie Rail, MD  fluticasone [facility]) 50 MCG/ACT nasal spray SPRAY ONE SPRAY IN EACH NOSTRIL ONCE DAILY 08/09/22   Binnie Rail, MD  glycopyrrolate (ROBINUL) 2 MG tablet Take 1 tablet (2 mg total) by mouth every morning. 08/11/22   Esterwood, Amy S, PA-C  ibuprofen (ADVIL) 200 MG tablet Take 200 mg by mouth every 6 (six) hours as needed.    [provider]  levothyroxine (SYNTHROID) 50 MCG tablet Take 1 tablet by mouth daily 02/03/22   Binnie Rail, MD  pyridOXINE (VITAMIN B-6) 100 MG tablet Take 100 mg by mouth daily.    [provider]  rosuvastatin (CRESTOR) 40 MG tablet Take 1 tablet (40 mg total) by mouth daily. 03/18/22   Binnie Rail, MD  vitamin C (ASCORBIC ACID) 500 MG tablet Take 500 mg by mouth 2 (two) times daily.    [provider]  vitamin E 400 UNIT capsule Take 400 Units by mouth daily.    [provider]     Allergies:    Allergies  Allergen Reactions   Atorvastatin Other (See Comments)    Increased LFTs    Social History:   Social History   Socioeconomic History   Marital status: Married    Spouse name: Not on file   Number of children: 2   Years of education: Not on file   Highest education level: Not on file  Occupational History   Occupation: insur exe    Employer: [employer]  Tobacco Use   Smoking status: Former   Smokeless tobacco: Never   Tobacco comments:    stopped smoking cigarettes 1983, 1 cigar /day 2001-2013  Vaping Use   Vaping Use: Never used  Substance and Sexual Activity   Alcohol use: Yes    Alcohol/week: 7.0 - 14.0 standard drinks of alcohol    Types: 7 - 14 Standard drinks or equivalent per week    Comment: scotch or wine daily   Drug use: No   Sexual activity: Yes  Other Topics Concern   Not on file  Social History Narrative   Exercises daily             Social Determinants of Health   Financial Resource Strain: Low Risk  (03/18/2022)   Overall Financial Resource Strain (CARDIA)    Difficulty of Paying Living Expenses: Not hard at all  Food Insecurity: No Food Insecurity (03/18/2022)   Hunger Vital Sign    Worried About Running Out of Food in the Last Year: Never true    Ran Out of Food in the Last Year: Never true  Transportation Needs: No Transportation Needs (03/18/2022)   PRAPARE - Transportation    Lack  of Transportation (Medical): No    Lack of Transportation (Non-Medical): No  Physical Activity: Sufficiently Active (03/18/2022)   Exercise Vital Sign    Days of Exercise per Week: 5 days    Minutes of Exercise per Session: 30 min  Stress: No Stress Concern Present (03/18/2022)   [place]    Feeling of Stress : Not at all  Social Connections: [place] (03/18/2022)   Social Connection and Isolation Panel [NHANES]    Frequency of Communication with Friends and Family: More than three times a week    Frequency of Social Gatherings with Friends and Family: Three times a week    Attends Religious Services: 1 to 4 times per year    Active Member of Clubs or Organizations: Yes    Attends Archivist Meetings: 1 to 4 times per year    Marital Status: Married  Human resources officer Violence: Not At Risk (03/18/2022)   Humiliation, Afraid, Rape, and Waters questionnaire    Fear of Current or Ex-Partner: No    Emotionally Abused: No    Physically Abused: No    Sexually Abused: No    Family History:   The patient's family history includes Alcohol abuse in his paternal uncle; COPD in his mother; Cancer in his father; Colon cancer (age of onset: 36) in his father; Hypertension in his paternal uncle; Lung cancer in his mother and sister; Prostate cancer in his father; Stroke (age of onset: 73) in his paternal uncle; Urolithiasis in his father. There is no  history of Asthma, Heart disease, Esophageal cancer, Stomach cancer, Liver disease, Colon polyps, or Rectal cancer.    ROS:  Please see the history of present illness.  All other ROS reviewed and negative.     Physical Exam/Data:   Vitals:   08/15/22 1800 08/15/22 1815 08/15/22 1821 08/15/22 1950  BP: 100/78 (!) 115/92  128/88  Pulse: (!) 53 (!) 107 (!) 51 62  Resp: 14 17 (!) 26 16  Temp:    98 F (36.7 C)  TempSrc:    Oral  SpO2: 96% 98% 97% 98%  Weight:    68.2 kg  Height:    5\' 7"  (1.702 m)   No intake or output data in the 24 hours ending 08/15/22 2105    08/15/2022    7:50 PM 08/15/2022    4:19 PM 07/29/2022    7:47 AM  Last 3 Weights  Weight (lbs) 150 lb 4.8 oz 145 lb 152 lb  Weight (kg) 68.176 kg 65.772 kg 68.947 kg     Body mass index is 23.54 kg/m.  General:  Well nourished, well developed, in no acute distress HEENT: normal Neck: no JVD Vascular: Right carotid bruit Cardiac: Irregularly irregular rhythm.  Apical systolic murmur. Lungs:  clear to auscultation bilaterally, no wheezing, rhonchi or rales  Abd: soft, nontender, no hepatomegaly  Ext: no edema Musculoskeletal:  No deformities, BUE and BLE strength normal and equal Skin: warm and dry  Neuro:  CNs 2-12 intact, no focal abnormalities noted Psych:  Normal affect    EKG: Atrial fibrillation  Relevant CV Studies:  Laboratory Data:  High Sensitivity Troponin:   Recent Labs  Lab 08/15/22 1631 08/15/22 1803  TROPONINIHS 581* 1,032*      Chemistry Recent Labs  Lab 08/15/22 1631  NA 139  K 3.9  CL 105  CO2 25  GLUCOSE 106*  BUN 14  CREATININE 0.89  CALCIUM 10.3  MG  2.2  GFRNONAA >60  ANIONGAP 9    Recent Labs  Lab 08/15/22 1631  PROT 7.2  ALBUMIN 4.5  AST 44*  ALT 24  ALKPHOS 38  BILITOT 0.5   Lipids No results for input(s): "CHOL", "TRIG", "HDL", "LABVLDL", "LDLCALC", "CHOLHDL" in the last 168 hours. Hematology Recent Labs  Lab 08/15/22 1631  WBC 8.5  RBC 4.44  HGB 14.6   HCT 42.8  MCV 96.4  MCH 32.9  MCHC 34.1  RDW 14.1  PLT 262   Thyroid  Recent Labs  Lab 08/15/22 1631  TSH 1.348   BNP Recent Labs  Lab 08/15/22 1631  BNP 352.2*    DDimer No results for input(s): "DDIMER" in the last 168 hours.   Radiology/Studies:  DG Chest Portable 1 View  Result Date: 08/15/2022 CLINICAL DATA:  Chest pain. Mild shortness of breath. Possible atrial fibrillation reported by Apple watch. EXAM: PORTABLE CHEST 1 VIEW COMPARISON:  06/15/2013 FINDINGS: The lungs appear clear. Cardiac and mediastinal contours normal. No pleural effusion identified. Mild lower thoracic spondylosis IMPRESSION: 1. No active disease. 2. Mild lower thoracic spondylosis. Electronically Signed   By: Van Clines M.D.   On: 08/15/2022 17:14     Assessment and Plan:   Jeremy Waters is a 77 year old with medical history of hypothyroidism, carotid artery disease, hyperlipidemia, prostate cancer status post TURP, symptomatic PVCs post COVID, who was admitted with new onset atrial fibrillation.  Patient is asymptomatic and it is likely that he has been going in and out of A-fib for some time now.  He denies history of sleep apnea and endorses drinking about 2 glasses of liquor a night.  Thyroid has been controlled based on his most recent TSH/free T4 checked in the emergency department.  He does have the substrate for A-fib given that already in October 2022 he had left atrial enlargement with a LAVI of 48 mL/m2. Regarding his troponin elevation, he did not have any chest pain and his shoulder pain does indeed sound musculoskeletal especially given the improvement with physical therapy and no change while doing his elliptical exercise for 35 minutes/day. With that being said, the degree of troponin elevation seems slightly higher than what would be expected just by atrial fibrillation. We could either get an echo and if there is wall motion proceed with LHC or just proceed with LHC to define  coronary anatomy regardless.  Non-valvular atrial fibrillation, chadsvasc 3 (age and carotid disease) Acute myocardial injury Mild aortic stenosis Carotid artery stenosis Hyperlipidemia Hypothyroidism  Recommendations: - Continue ASA 81 mg daily for now and heparin drip. If decision is to not proceed for cath then transition to [place] monotherapy. - Echocardiogram in the morning - NPO for possible LHC in am - Lopressor 25 mg Q6hrs - Continue Crestor 40 mg daily - Lipid panel - Counseling about alcohol cessation - May need sleep apnea evaluation as an outpatient - May consider restoring sinus rhythm prior to dc    Risk Assessment/Risk Scores:         CHA2DS2-VASc Score =     This indicates a  % annual risk of stroke. The patient's score is based upon:        Severity of Illness: The appropriate patient status for this patient is OBSERVATION. Observation status is judged to be reasonable and necessary in order to provide the required intensity of service to ensure the patient's safety. The patient's presenting symptoms, physical exam findings, and initial radiographic and laboratory data in the context  of their medical condition is felt to place them at decreased risk for further clinical deterioration. Furthermore, it is anticipated that the patient will be medically stable for discharge from the hospital within 2 midnights of admission.    For questions or updates, please contact [place] Please consult [email or web address] for contact info under     Signed, Ewing Schlein, MD  08/15/2022 9:05 PM

## 2022-08-15 NOTE — Progress Notes (Signed)
ANTICOAGULATION CONSULT NOTE - Initial Consult  Pharmacy Consult for heparin  Indication: chest pain/ACS and presented with Afib   Allergies  Allergen Reactions   Atorvastatin Other (See Comments)    Increased LFTs    Patient Measurements: Height: 5\' 7"  (170.2 cm) Weight: 65.8 kg (145 lb) IBW/kg (Calculated) : 66.1 Heparin Dosing Weight: 65.8kg   Vital Signs: Temp: 98.8 F (37.1 C) (03/24 1617) Temp Source: Oral (03/24 1617) BP: 103/74 (03/24 1630) Pulse Rate: 96 (03/24 1630)  Labs: Recent Labs    08/15/22 1631  HGB 14.6  HCT 42.8  PLT 262  CREATININE 0.89  TROPONINIHS 581*    Estimated Creatinine Clearance: 66.7 mL/min (by C-G formula based on SCr of 0.89 mg/dL).   Medical History: Past Medical History:  Diagnosis Date   Adenomatous colon polyp 09/2007   Arthritis    OA / PAIN LEFT KNEE   Cancer ([place])    prostate cancer   Cataract    removed both eyes   COVID-19 virus infection    Diverticulosis of colon    w/o hemorrage   Heart murmur    TOLD HE HAS A SLIGHT MURMUR   Hyperlipidemia    Inguinal hernia    left side    Thyroid disease     Assessment: Patient presenting due to [place] alerting him that he was in Afib. No hx of Afib prior, reports no chest pain however troponin elevated to 581. No anticoag PTA, pharmacy consulted to dose heparin.   Goal of Therapy:  Heparin level 0.3-0.7 units/ml Monitor platelets by anticoagulation protocol: Yes   Plan:  Give 3900 units bolus x 1 Start heparin infusion at 750 units/hr Check anti-Xa level in 8 hours and daily while on heparin Continue to monitor H&H and platelets  Esmeralda Arthur, PharmD, BCCCP  08/15/2022,5:47 PM

## 2022-08-16 ENCOUNTER — Encounter (HOSPITAL_COMMUNITY)
Admission: EM | Disposition: A | Discharge status: Home / Self Care | Payer: Self-pay | Source: Home / Self Care | Attending: Cardiothoracic Surgery

## 2022-08-16 ENCOUNTER — Inpatient Hospital Stay (HOSPITAL_COMMUNITY): Payer: PPO

## 2022-08-16 ENCOUNTER — Other Ambulatory Visit (HOSPITAL_COMMUNITY): Payer: Self-pay

## 2022-08-16 DIAGNOSIS — I214 Non-ST elevation (NSTEMI) myocardial infarction: Secondary | ICD-10-CM | POA: Diagnosis not present

## 2022-08-16 DIAGNOSIS — I2510 Atherosclerotic heart disease of native coronary artery without angina pectoris: Secondary | ICD-10-CM

## 2022-08-16 DIAGNOSIS — I4891 Unspecified atrial fibrillation: Secondary | ICD-10-CM

## 2022-08-16 DIAGNOSIS — I480 Paroxysmal atrial fibrillation: Secondary | ICD-10-CM | POA: Diagnosis not present

## 2022-08-16 HISTORY — PX: LEFT HEART CATH AND CORONARY ANGIOGRAPHY: CATH118249

## 2022-08-16 LAB — CBC
HCT: 42.2 % (ref 39.0–52.0)
Hemoglobin: 14.7 g/dL (ref 13.0–17.0)
MCH: 33.5 pg (ref 26.0–34.0)
MCHC: 34.8 g/dL (ref 30.0–36.0)
MCV: 96.1 fL (ref 80.0–100.0)
Platelets: 239 10*3/uL (ref 150–400)
RBC: 4.39 MIL/uL (ref 4.22–5.81)
RDW: 14 % (ref 11.5–15.5)
WBC: 7.8 10*3/uL (ref 4.0–10.5)
nRBC: 0 % (ref 0.0–0.2)

## 2022-08-16 LAB — BASIC METABOLIC PANEL
Anion gap: 6 (ref 5–15)
BUN: 13 mg/dL (ref 8–23)
CO2: 25 mmol/L (ref 22–32)
Calcium: 8.9 mg/dL (ref 8.9–10.3)
Chloride: 106 mmol/L (ref 98–111)
Creatinine, Ser: 0.87 mg/dL (ref 0.61–1.24)
GFR, Estimated: >60 mL/min (ref >60)
Glucose, Bld: 109 mg/dL — ABNORMAL HIGH (ref 70–99)
Potassium: 3.8 mmol/L (ref 3.5–5.1)
Sodium: 137 mmol/L (ref 135–145)

## 2022-08-16 LAB — ECHOCARDIOGRAM COMPLETE
AR max vel: 0.94 cm2
AV Area VTI: 0.96 cm2
AV Area mean vel: 0.95 cm2
AV Mean grad: 12 mmHg
AV Peak grad: 23.9 mmHg
Ao pk vel: 2.45 m/s
Calc EF: 52.9 %
Height: 67 in
P 1/2 time: 504 msec
S' Lateral: 3.4 cm
Single Plane A2C EF: 52.3 %
Single Plane A4C EF: 53 %
Weight: 2404.8 oz

## 2022-08-16 LAB — HEPARIN LEVEL (UNFRACTIONATED)
Heparin Unfractionated: 0.19 IU/mL — ABNORMAL LOW (ref 0.30–0.70)
Heparin Unfractionated: 0.29 IU/mL — ABNORMAL LOW (ref 0.30–0.70)

## 2022-08-16 SURGERY — LEFT HEART CATH AND CORONARY ANGIOGRAPHY
Anesthesia: LOCAL

## 2022-08-16 MED ORDER — HEPARIN SODIUM (PORCINE) 1000 UNIT/ML IJ SOLN
INTRAMUSCULAR | Status: AC
  Filled 2022-08-16: qty 10

## 2022-08-16 MED ORDER — SODIUM CHLORIDE 0.9 % IV SOLN: 250.0000 mL | INTRAVENOUS | Status: DC | PRN

## 2022-08-16 MED ORDER — ACETAMINOPHEN 325 MG PO TABS: 650.0000 mg | ORAL_TABLET | ORAL | Status: DC | PRN

## 2022-08-16 MED ORDER — HYDRALAZINE HCL 20 MG/ML IJ SOLN: 10.0000 mg | INTRAMUSCULAR | Status: AC | PRN

## 2022-08-16 MED ORDER — LIDOCAINE HCL (PF) 1 % IJ SOLN
INTRAMUSCULAR | Status: AC
  Filled 2022-08-16: qty 30

## 2022-08-16 MED ORDER — LABETALOL HCL 5 MG/ML IV SOLN: 10.0000 mg | INTRAVENOUS | Status: AC | PRN

## 2022-08-16 MED ORDER — HEPARIN SODIUM (PORCINE) 1000 UNIT/ML IJ SOLN
INTRAMUSCULAR | Status: DC | PRN
  Administered 2022-08-16: 5000 [IU] via INTRA_ARTERIAL

## 2022-08-16 MED ORDER — LIDOCAINE HCL (PF) 1 % IJ SOLN
INTRAMUSCULAR | Status: DC | PRN
  Administered 2022-08-16: 5 mL

## 2022-08-16 MED ORDER — IOHEXOL 350 MG/ML SOLN
INTRAVENOUS | Status: DC | PRN
  Administered 2022-08-16: 40 mL

## 2022-08-16 MED ORDER — SODIUM CHLORIDE 0.9% FLUSH
3.0000 mL | Freq: Two times a day (BID) | INTRAVENOUS | Status: DC
  Administered 2022-08-16 – 2022-08-17 (×4): 3 mL via INTRAVENOUS

## 2022-08-16 MED ORDER — ASPIRIN 81 MG PO CHEW: 81.0000 mg | CHEWABLE_TABLET | ORAL | Status: DC

## 2022-08-16 MED ORDER — SODIUM CHLORIDE 0.9% FLUSH: 3.0000 mL | INTRAVENOUS | Status: DC | PRN

## 2022-08-16 MED ORDER — SODIUM CHLORIDE 0.9 % IV SOLN
INTRAVENOUS | Status: AC | PRN
  Administered 2022-08-16: 500 mL via INTRAVENOUS

## 2022-08-16 MED ORDER — VERAPAMIL HCL 2.5 MG/ML IV SOLN
INTRAVENOUS | Status: AC
  Filled 2022-08-16: qty 2

## 2022-08-16 MED ORDER — VERAPAMIL HCL 2.5 MG/ML IV SOLN
INTRAVENOUS | Status: DC | PRN
  Administered 2022-08-16: 10 mL via INTRA_ARTERIAL

## 2022-08-16 MED ORDER — FENTANYL CITRATE (PF) 100 MCG/2ML IJ SOLN
INTRAMUSCULAR | Status: AC
  Filled 2022-08-16: qty 2

## 2022-08-16 MED ORDER — FENTANYL CITRATE (PF) 100 MCG/2ML IJ SOLN
INTRAMUSCULAR | Status: DC | PRN
  Administered 2022-08-16: 25 ug via INTRAVENOUS

## 2022-08-16 MED ORDER — ASPIRIN 81 MG PO TBEC: 81.0000 mg | DELAYED_RELEASE_TABLET | Freq: Every day | ORAL | Status: DC

## 2022-08-16 MED ORDER — MIDAZOLAM HCL 2 MG/2ML IJ SOLN
INTRAMUSCULAR | Status: DC | PRN
  Administered 2022-08-16: 1 mg via INTRAVENOUS

## 2022-08-16 MED ORDER — SODIUM CHLORIDE 0.9 % IV SOLN: INTRAVENOUS | Status: AC

## 2022-08-16 MED ORDER — SODIUM CHLORIDE 0.9 % IV SOLN: INTRAVENOUS | Status: DC

## 2022-08-16 MED ORDER — HEPARIN (PORCINE) IN NACL 1000-0.9 UT/500ML-% IV SOLN
INTRAVENOUS | Status: DC | PRN
  Administered 2022-08-16: 1000 mL

## 2022-08-16 MED ORDER — HEPARIN BOLUS VIA INFUSION
2000.0000 [IU] | Freq: Once | INTRAVENOUS | Status: AC
  Administered 2022-08-16: 2000 [IU] via INTRAVENOUS
  Filled 2022-08-16: qty 2000

## 2022-08-16 MED ORDER — ONDANSETRON HCL 4 MG/2ML IJ SOLN: 4.0000 mg | Freq: Four times a day (QID) | INTRAMUSCULAR | Status: DC | PRN

## 2022-08-16 MED ORDER — HEPARIN (PORCINE) 25000 UT/250ML-% IV SOLN
1100.0000 [IU]/h | INTRAVENOUS | Status: DC
  Administered 2022-08-17: 1100 [IU]/h via INTRAVENOUS
  Filled 2022-08-16: qty 250

## 2022-08-16 MED ORDER — MIDAZOLAM HCL 2 MG/2ML IJ SOLN
INTRAMUSCULAR | Status: AC
  Filled 2022-08-16: qty 2

## 2022-08-16 MED ORDER — SODIUM CHLORIDE 0.9% FLUSH
3.0000 mL | Freq: Two times a day (BID) | INTRAVENOUS | Status: DC
  Administered 2022-08-16 – 2022-08-17 (×2): 3 mL via INTRAVENOUS

## 2022-08-16 SURGICAL SUPPLY — 11 items
CATH INFINITI 5FR ANG PIGTAIL (CATHETERS) IMPLANT
CATH OPTITORQUE TIG 4.0 6F (CATHETERS) IMPLANT
DEVICE RAD COMP TR BAND LRG (VASCULAR PRODUCTS) IMPLANT
GLIDESHEATH SLEND SS 6F .021 (SHEATH) IMPLANT
GUIDEWIRE INQWIRE 1.5J.035X260 (WIRE) IMPLANT
INQWIRE 1.5J .035X260CM (WIRE) ×1
KIT HEART LEFT (KITS) ×2 IMPLANT
PACK CARDIAC CATHETERIZATION (CUSTOM PROCEDURE TRAY) ×2 IMPLANT
SHEATH PROBE COVER 6X72 (BAG) IMPLANT
TRANSDUCER W/STOPCOCK (MISCELLANEOUS) ×2 IMPLANT
TUBING CIL FLEX 10 FLL-RA (TUBING) ×2 IMPLANT

## 2022-08-16 NOTE — Progress Notes (Signed)
ANTICOAGULATION CONSULT NOTE  Pharmacy Consult for heparin  Indication: chest pain/ACS, afib  Allergies  Allergen Reactions   Atorvastatin Other (See Comments)    Increased LFTs    Patient Measurements: Height: 5\' 7"  (170.2 cm) Weight: 68.2 kg (150 lb 4.8 oz) IBW/kg (Calculated) : 66.1 Heparin Dosing Weight: 65.8kg   Vital Signs: Temp: 98 F (36.7 C) (03/25 0000) Temp Source: Oral (03/25 0000) BP: 131/87 (03/25 0000) Pulse Rate: 96 (03/25 0035)  Labs: Recent Labs    08/15/22 1631 08/15/22 1803 08/15/22 2024 08/15/22 2202 08/16/22 0219  HGB 14.6  --   --   --  14.7  HCT 42.8  --   --   --  42.2  PLT 262  --   --   --  239  HEPARINUNFRC  --   --   --   --  0.19*  CREATININE 0.89  --   --   --  0.87  TROPONINIHS 581* 1,032* 1,518* 2,023*  --      Estimated Creatinine Clearance: 68.6 mL/min (by C-G formula based on SCr of 0.87 mg/dL).   Medical History: Past Medical History:  Diagnosis Date   Adenomatous colon polyp 09/2007   Arthritis    OA / PAIN LEFT KNEE   Cancer ([place])    prostate cancer   Cataract    removed both eyes   COVID-19 virus infection    Diverticulosis of colon    w/o hemorrage   Heart murmur    TOLD HE HAS A SLIGHT MURMUR   Hyperlipidemia    Inguinal hernia    left side    Thyroid disease     Assessment: Patient presenting due to [place] alerting him that he was in Afib. No hx of Afib prior, reports no chest pain however troponin elevated to 581. No anticoag PTA, pharmacy consulted to dose heparin.   3/25 AM update:  Heparin level below goal  Goal of Therapy:  Heparin level 0.3-0.7 units/ml Monitor platelets by anticoagulation protocol: Yes   Plan:  Heparin 2000 units re-[employer] heparin to 900 units/hr 1100 heparin level  Narda Bonds, PharmD, BCPS Clinical Pharmacist Phone: [phone]

## 2022-08-16 NOTE — ED Provider Notes (Incomplete)
[facility] Provider Note   CSN: [id number] Arrival date & time: 08/15/22  1609     History {Add pertinent medical, surgical, social history, OB history to HPI:1} No chief complaint on file.   Jeremy Waters is a 77 y.o. male.  HPI     77 year old male with a history of smoking, prostate cancer and prostatectomy status post TURP no radiation, hyperlipidemia, prior cardiology visit for PVCs, right bundle branch block who presents with concern for atrial fibrillation noted on apple watch and intermittent chest, shoulder aching and left arm tingling for last 10 days.   10 days has had chest, shoulder aching and tingling in left arm Saw Dr. Quay Burow, thought maybe pinched nerve Chest and shoulder aching coming and going Does not appear to be brough on by exercising, seems more random Thursday aws more severe in AM but ended up walking 18 holes of golf anyway and it felt better Watch today said afib but did not say it otherwise Maybe a little more run down today, did have episode of shoulder ahdn chest pain earlier today  No dyspnea, nausea, vomiting Felt some lightheadedness today No leg pain or swelling  No hx of dvt/pe, no recent trips, surgeries  No hx of afib before  Occasional cigar, cocktails in evening, no other drug use.  Birthday dinner for wife last night--did have cocktails and wine  Past Medical History:  Diagnosis Date  . Adenomatous colon polyp 09/2007  . Arthritis    OA / PAIN LEFT KNEE  . Cancer [facility])    prostate cancer  . Cataract    removed both eyes  . COVID-19 virus infection   . Diverticulosis of colon    w/o hemorrage  . Heart murmur    TOLD HE HAS A SLIGHT MURMUR  . Hyperlipidemia   . Inguinal hernia    left side   . Thyroid disease     Home Medications Prior to Admission medications   Medication Sig Start Date End Date Taking? Authorizing Provider  beta carotene 25000 UNIT capsule Take 25,000  Units by mouth daily.    [provider]  clonazePAM (KLONOPIN) 0.5 MG tablet TAKE ONE TABLET BY MOUTH AT BEDTIME AS NEEDED 09/07/21   Binnie Rail, MD  fluticasone [facility]) 50 MCG/ACT nasal spray SPRAY ONE SPRAY IN EACH NOSTRIL ONCE DAILY 08/09/22   Binnie Rail, MD  glycopyrrolate (ROBINUL) 2 MG tablet Take 1 tablet (2 mg total) by mouth every morning. 08/11/22   Esterwood, Amy S, PA-C  ibuprofen (ADVIL) 200 MG tablet Take 200 mg by mouth every 6 (six) hours as needed.    [provider]  levothyroxine (SYNTHROID) 50 MCG tablet Take 1 tablet by mouth daily 02/03/22   Binnie Rail, MD  pyridOXINE (VITAMIN B-6) 100 MG tablet Take 100 mg by mouth daily.    [provider]  rosuvastatin (CRESTOR) 40 MG tablet Take 1 tablet (40 mg total) by mouth daily. 03/18/22   Binnie Rail, MD  vitamin C (ASCORBIC ACID) 500 MG tablet Take 500 mg by mouth 2 (two) times daily.    [provider]  vitamin E 400 UNIT capsule Take 400 Units by mouth daily.    [provider]      Allergies    Atorvastatin    Review of Systems   Review of Systems  Physical Exam Updated Vital Signs BP 132/88 (BP Location: Right Arm)   Pulse (!) 114  Temp 98.8 F (37.1 C) (Oral)   Resp 14   Ht 5\' 7"  (1.702 m)   Wt 65.8 kg   SpO2 96%   BMI 22.71 kg/m  Physical Exam Vitals and nursing note reviewed.  Constitutional:      General: He is not in acute distress.    Appearance: He is well-developed. He is not diaphoretic.  HENT:     Head: Normocephalic and atraumatic.  Eyes:     Conjunctiva/sclera: Conjunctivae normal.  Cardiovascular:     Rate and Rhythm: Tachycardia present. Rhythm irregular.     Heart sounds: Normal heart sounds. No murmur heard.    No friction rub. No gallop.  Pulmonary:     Effort: Pulmonary effort is normal. No respiratory distress.     Breath sounds: Normal breath sounds. No wheezing or rales.  Abdominal:     General: There is no distension.      Palpations: Abdomen is soft.     Tenderness: There is no abdominal tenderness. There is no guarding.  Musculoskeletal:     Cervical back: Normal range of motion.  Skin:    General: Skin is warm and dry.  Neurological:     Mental Status: He is alert and oriented to person, place, and time.     ED Results / Procedures / Treatments   Labs (all labs ordered are listed, but only abnormal results are displayed) Labs Reviewed - No data to display  EKG EKG Interpretation  Date/Time:  "Sunday August 15 2022 16:17:40 EDT Ventricular Rate:  140 PR Interval:    QRS Duration: 106 QT Interval:  314 QTC Calculation: 480 R Axis:   14 Text Interpretation: Atrial fibrillation RSR' in V1 or V2, right VCD or RVH Borderline prolonged QT interval Since prior ECG, rate has increased, atrial fibrillation is new Confirmed by Ladelle Teodoro ([id number]) on 08/15/2022 4:26:45 PM  Radiology No results found.  Procedures Procedures  {Document cardiac monitor, telemetry assessment procedure when appropriate:1}  Medications Ordered in ED Medications - No data to display  ED Course/ Medical Decision Making/ A&P   {   Click here for ABCD2, HEART and other calculatorsREFRESH Note before signing :1}                           76"  year old male with a history of smoking, prostate cancer and prostatectomy status post TURP no radiation, hyperlipidemia, prior cardiology visit for PVCs, right bundle branch block who presents with concern for atrial fibrillation noted on apple watch and intermittent chest, shoulder aching and left arm tingling for last 10 days.   Arrives to the emergency department with atrial fibrillation with RVR.  EKG personally about interpreted by me shows no acute ST elevations, does show nonspecific T wave changes in the setting of atrial fibrillation with RVR.  Differential diagnosis for chest pain/shoulder pain includes pulmonary embolus, dissection, pneumothorax, pneumonia, ACS,  myocarditis, pericarditis, radiculopathy, anginal symptoms in setting of atrial fibrillation.   Chest x-ray was done and evaluated by me and radiology and showed no sign of pneumonia or pneumothorax.   Patient is PERC negative and low risk [place]*** and have low suspicion for PE.  Patient is low risk HEART score and had delta troponins which were both negative***.  Do not feel history or exam are consistent with aortic dissection.     {Document critical care time when appropriate:1} {Document review of labs and clinical decision tools ie heart score, Chads2Vasc2 etc:1}  {  Document your independent review of radiology images, and any outside records:1} {Document your discussion with family members, caretakers, and with consultants:1} {Document social determinants of health affecting pt's care:1} {Document your decision making why or why not admission, treatments were needed:1} Final Clinical Impression(s) / ED Diagnoses Final diagnoses:  None    Rx / DC Orders ED Discharge Orders     None

## 2022-08-16 NOTE — H&P (View-Only) (Signed)
Rounding Note    Patient Name: Jeremy Waters Date of Encounter: 08/16/2022  [facility] Cardiologist: None   Subjective   Denies chest pain or shortness of breath.  Was awakened last night with left arm pain.  Inpatient Medications    Scheduled Meds:  aspirin EC  81 mg Oral Daily   levothyroxine  50 mcg Oral QAC breakfast   metoprolol tartrate  25 mg Oral QID   rosuvastatin  40 mg Oral Daily   Continuous Infusions:  heparin 900 Units/hr (08/16/22 0350)   PRN Meds: acetaminophen, nitroGLYCERIN, ondansetron (ZOFRAN) IV   Vital Signs    Vitals:   08/16/22 0300 08/16/22 0354 08/16/22 0400 08/16/22 0700  BP: (!) 119/92  108/79 (!) 115/97  Pulse: 82  79 79  Resp: 14  20 18   Temp:  98.3 F (36.8 C)  98 F (36.7 C)  TempSrc:  Oral  Oral  SpO2: 97%  97% 98%  Weight:      Height:        Intake/Output Summary (Last 24 hours) at 08/16/2022 0957 Last data filed at 08/16/2022 [id number] Gross per 24 hour  Intake 152.96 ml  Output 650 ml  Net -497.04 ml      08/15/2022    7:50 PM 08/15/2022    4:19 PM 07/29/2022    7:47 AM  Last 3 Weights  Weight (lbs) 150 lb 4.8 oz 145 lb 152 lb  Weight (kg) 68.176 kg 65.772 kg 68.947 kg      Telemetry    Atrial fibrillation with a ventricular sponsor in the 80s/90s.- Personally Reviewed  ECG    Not performed today- Personally Reviewed  Physical Exam   GEN: No acute distress.   Neck: No JVD Cardiac: Irregularly irregular, no murmurs, rubs, or gallops.  Respiratory: Clear to auscultation bilaterally. GI: Soft, nontender, non-distended  MS: No edema; No deformity. Neuro:  Nonfocal  Psych: Normal affect   Labs    High Sensitivity Troponin:   Recent Labs  Lab 08/15/22 1631 08/15/22 1803 08/15/22 2024 08/15/22 2202  TROPONINIHS 581* 1,032* 1,518* 2,023*     Chemistry Recent Labs  Lab 08/15/22 1631 08/16/22 0219  NA 139 137  K 3.9 3.8  CL 105 106  CO2 25 25  GLUCOSE 106* 109*  BUN 14 13  CREATININE  0.89 0.87  CALCIUM 10.3 8.9  MG 2.2  --   PROT 7.2  --   ALBUMIN 4.5  --   AST 44*  --   ALT 24  --   ALKPHOS 38  --   BILITOT 0.5  --   GFRNONAA >60 >60  ANIONGAP 9 6    Lipids No results for input(s): "CHOL", "TRIG", "HDL", "LABVLDL", "LDLCALC", "CHOLHDL" in the last 168 hours.  Hematology Recent Labs  Lab 08/15/22 1631 08/16/22 0219  WBC 8.5 7.8  RBC 4.44 4.39  HGB 14.6 14.7  HCT 42.8 42.2  MCV 96.4 96.1  MCH 32.9 33.5  MCHC 34.1 34.8  RDW 14.1 14.0  PLT 262 239   Thyroid  Recent Labs  Lab 08/15/22 1631  TSH 1.348  FREET4 0.89    BNP Recent Labs  Lab 08/15/22 1631  BNP 352.2*    DDimer No results for input(s): "DDIMER" in the last 168 hours.   Radiology    DG Chest Portable 1 View  Result Date: 08/15/2022 CLINICAL DATA:  Chest pain. Mild shortness of breath. Possible atrial fibrillation reported by Apple watch. EXAM: PORTABLE CHEST 1  VIEW COMPARISON:  06/15/2013 FINDINGS: The lungs appear clear. Cardiac and mediastinal contours normal. No pleural effusion identified. Mild lower thoracic spondylosis IMPRESSION: 1. No active disease. 2. Mild lower thoracic spondylosis. Electronically Signed   By: Van Clines M.D.   On: 08/15/2022 17:14    Cardiac Studies   None  Patient Profile     77 y.o. thin-appearing married Caucasian male admitted with A-fib with RVR.  He does have a history of hyperlipidemia.  His troponins steadily rose to over 2000.  It is possible that his left shoulder/arm pain is "an anginal equivalent".  He is currently pain-free on IV heparin.  Assessment & Plan    1: Non-STEMI-troponin steadily increased up to 2000.  Currently pain-free on IV heparin.  2D echo currently being performed.  Agree with coronary angiography to define his anatomy.The patient understands that risks included but are not limited to stroke (1 in 1000), death (1 in 69), kidney failure [usually temporary] (1 in 500), bleeding (1 in 200), allergic reaction  [possibly serious] (1 in 200).  The patient understands and agrees to proceed  2: Hyperlipidemia-on high-dose Crestor as outpatient  3: PAF-currently in A-fib with ventricular sponsor of 80-90 on IV heparin.The CHA2DSVASC2 score is  3 .  He will need to be on a DOAC at discharge.  Hard to tell when this began although if it was within 24 hours of presentation he may be candidate for DC cardioversion prior to discharge.     For questions or updates, please contact [place] Please consult [email or web address] for contact info under        Signed, Quay Burow, MD  08/16/2022, 9:57 AM

## 2022-08-16 NOTE — Progress Notes (Signed)
ANTICOAGULATION CONSULT NOTE  Pharmacy Consult for heparin  Indication: chest pain/ACS, afib  Allergies  Allergen Reactions   Atorvastatin Other (See Comments)    Increased LFTs    Patient Measurements: Height: 5\' 7"  (170.2 cm) Weight: 68.2 kg (150 lb 4.8 oz) IBW/kg (Calculated) : 66.1 Heparin Dosing Weight: 65.8kg   Vital Signs: Temp: 98 F (36.7 C) (03/25 0700) Temp Source: Oral (03/25 0700) BP: 115/97 (03/25 0700) Pulse Rate: 79 (03/25 0700)  Labs: Recent Labs    08/15/22 1631 08/15/22 1803 08/15/22 2024 08/15/22 2202 08/16/22 0219 08/16/22 1045  HGB 14.6  --   --   --  14.7  --   HCT 42.8  --   --   --  42.2  --   PLT 262  --   --   --  239  --   HEPARINUNFRC  --   --   --   --  0.19* 0.29*  CREATININE 0.89  --   --   --  0.87  --   TROPONINIHS 581* 1,032* 1,518* 2,023*  --   --      Estimated Creatinine Clearance: 68.6 mL/min (by C-G formula based on SCr of 0.87 mg/dL).   Medical History: Past Medical History:  Diagnosis Date   Adenomatous colon polyp 09/2007   Arthritis    OA / PAIN LEFT KNEE   Cancer ([place])    prostate cancer   Cataract    removed both eyes   COVID-19 virus infection    Diverticulosis of colon    w/o hemorrage   Heart murmur    TOLD HE HAS A SLIGHT MURMUR   Hyperlipidemia    Inguinal hernia    left side    Thyroid disease     Assessment: Patient presenting due to [place] alerting him that he was in Afib. No hx of Afib prior, reports no chest pain however troponin elevated. Pharmacy consulted to dose heparin.   -heparin level= 0.29 on heparin 900 units/hr -plans noted for cath today  Goal of Therapy:  Heparin level 0.3-0.7 units/ml Monitor platelets by anticoagulation protocol: Yes   Plan:  -No heparin adjustments now since going to cath  -Will follow plans post procedure  Hildred Laser, PharmD Clinical Pharmacist **Pharmacist phone directory can now be found on [email or web address] ([email or web address]).  Listed under [place].

## 2022-08-16 NOTE — Progress Notes (Signed)
Echocardiogram 2D Echocardiogram has been performed.  Jeremy Waters 08/16/2022, 11:12 AM

## 2022-08-16 NOTE — Progress Notes (Signed)
Rounding Note    Patient Name: Jeremy Waters Date of Encounter: 08/16/2022  [facility] Cardiologist: None   Subjective   Denies chest pain or shortness of breath.  Was awakened last night with left arm pain.  Inpatient Medications    Scheduled Meds:  aspirin EC  81 mg Oral Daily   levothyroxine  50 mcg Oral QAC breakfast   metoprolol tartrate  25 mg Oral QID   rosuvastatin  40 mg Oral Daily   Continuous Infusions:  heparin 900 Units/hr (08/16/22 0350)   PRN Meds: acetaminophen, nitroGLYCERIN, ondansetron (ZOFRAN) IV   Vital Signs    Vitals:   08/16/22 0300 08/16/22 0354 08/16/22 0400 08/16/22 0700  BP: (!) 119/92  108/79 (!) 115/97  Pulse: 82  79 79  Resp: 14  20 18   Temp:  98.3 F (36.8 C)  98 F (36.7 C)  TempSrc:  Oral  Oral  SpO2: 97%  97% 98%  Weight:      Height:        Intake/Output Summary (Last 24 hours) at 08/16/2022 0957 Last data filed at 08/16/2022 [id number] Gross per 24 hour  Intake 152.96 ml  Output 650 ml  Net -497.04 ml      08/15/2022    7:50 PM 08/15/2022    4:19 PM 07/29/2022    7:47 AM  Last 3 Weights  Weight (lbs) 150 lb 4.8 oz 145 lb 152 lb  Weight (kg) 68.176 kg 65.772 kg 68.947 kg      Telemetry    Atrial fibrillation with a ventricular sponsor in the 80s/90s.- Personally Reviewed  ECG    Not performed today- Personally Reviewed  Physical Exam   GEN: No acute distress.   Neck: No JVD Cardiac: Irregularly irregular, no murmurs, rubs, or gallops.  Respiratory: Clear to auscultation bilaterally. GI: Soft, nontender, non-distended  MS: No edema; No deformity. Neuro:  Nonfocal  Psych: Normal affect   Labs    High Sensitivity Troponin:   Recent Labs  Lab 08/15/22 1631 08/15/22 1803 08/15/22 2024 08/15/22 2202  TROPONINIHS 581* 1,032* 1,518* 2,023*     Chemistry Recent Labs  Lab 08/15/22 1631 08/16/22 0219  NA 139 137  K 3.9 3.8  CL 105 106  CO2 25 25  GLUCOSE 106* 109*  BUN 14 13  CREATININE  0.89 0.87  CALCIUM 10.3 8.9  MG 2.2  --   PROT 7.2  --   ALBUMIN 4.5  --   AST 44*  --   ALT 24  --   ALKPHOS 38  --   BILITOT 0.5  --   GFRNONAA >60 >60  ANIONGAP 9 6    Lipids No results for input(s): "CHOL", "TRIG", "HDL", "LABVLDL", "LDLCALC", "CHOLHDL" in the last 168 hours.  Hematology Recent Labs  Lab 08/15/22 1631 08/16/22 0219  WBC 8.5 7.8  RBC 4.44 4.39  HGB 14.6 14.7  HCT 42.8 42.2  MCV 96.4 96.1  MCH 32.9 33.5  MCHC 34.1 34.8  RDW 14.1 14.0  PLT 262 239   Thyroid  Recent Labs  Lab 08/15/22 1631  TSH 1.348  FREET4 0.89    BNP Recent Labs  Lab 08/15/22 1631  BNP 352.2*    DDimer No results for input(s): "DDIMER" in the last 168 hours.   Radiology    DG Chest Portable 1 View  Result Date: 08/15/2022 CLINICAL DATA:  Chest pain. Mild shortness of breath. Possible atrial fibrillation reported by Apple watch. EXAM: PORTABLE CHEST 1  VIEW COMPARISON:  06/15/2013 FINDINGS: The lungs appear clear. Cardiac and mediastinal contours normal. No pleural effusion identified. Mild lower thoracic spondylosis IMPRESSION: 1. No active disease. 2. Mild lower thoracic spondylosis. Electronically Signed   By: Van Clines M.D.   On: 08/15/2022 17:14    Cardiac Studies   None  Patient Profile     77 y.o. thin-appearing married Caucasian male admitted with A-fib with RVR.  He does have a history of hyperlipidemia.  His troponins steadily rose to over 2000.  It is possible that his left shoulder/arm pain is "an anginal equivalent".  He is currently pain-free on IV heparin.  Assessment & Plan    1: Non-STEMI-troponin steadily increased up to 2000.  Currently pain-free on IV heparin.  2D echo currently being performed.  Agree with coronary angiography to define his anatomy.The patient understands that risks included but are not limited to stroke (1 in 1000), death (1 in 63), kidney failure [usually temporary] (1 in 500), bleeding (1 in 200), allergic reaction  [possibly serious] (1 in 200).  The patient understands and agrees to proceed  2: Hyperlipidemia-on high-dose Crestor as outpatient  3: PAF-currently in A-fib with ventricular sponsor of 80-90 on IV heparin.The CHA2DSVASC2 score is  3 .  He will need to be on a DOAC at discharge.  Hard to tell when this began although if it was within 24 hours of presentation he may be candidate for DC cardioversion prior to discharge.     For questions or updates, please contact [place] Please consult [email or web address] for contact info under        Signed, Quay Burow, MD  08/16/2022, 9:57 AM

## 2022-08-16 NOTE — Progress Notes (Signed)
ANTICOAGULATION CONSULT NOTE  Pharmacy Consult for heparin  Indication: chest pain/ACS, afib  Allergies  Allergen Reactions   Atorvastatin Other (See Comments)    Increased LFTs    Patient Measurements: Height: 5\' 7"  (170.2 cm) Weight: 65.7 kg (144 lb 13.5 oz) IBW/kg (Calculated) : 66.1 Heparin Dosing Weight: 65.8kg   Vital Signs: Temp: 97.4 F (36.3 C) (03/25 1232) Temp Source: Oral (03/25 1232) BP: 111/79 (03/25 1712) Pulse Rate: 0 (03/25 1717)  Labs: Recent Labs    08/15/22 1631 08/15/22 1803 08/15/22 2024 08/15/22 2202 08/16/22 0219 08/16/22 1045  HGB 14.6  --   --   --  14.7  --   HCT 42.8  --   --   --  42.2  --   PLT 262  --   --   --  239  --   HEPARINUNFRC  --   --   --   --  0.19* 0.29*  CREATININE 0.89  --   --   --  0.87  --   TROPONINIHS 581* 1,032* 1,518* 2,023*  --   --      Estimated Creatinine Clearance: 68.2 mL/min (by C-G formula based on SCr of 0.87 mg/dL).   Medical History: Past Medical History:  Diagnosis Date   Adenomatous colon polyp 09/2007   Arthritis    OA / PAIN LEFT KNEE   Cancer ([place])    prostate cancer   Cataract    removed both eyes   COVID-19 virus infection    Diverticulosis of colon    w/o hemorrage   Heart murmur    TOLD HE HAS A SLIGHT MURMUR   Hyperlipidemia    Inguinal hernia    left side    Thyroid disease     Assessment: Patient presenting due to [place] alerting him that he was in Afib. No hx of Afib prior, reports no chest pain however troponin elevated to 581. No anticoag PTA, pharmacy consulted to dose heparin.   Cath 3/25 showing stenosis in LAD, RCA, and Cx - plan for CTVS consult. Was on heparin prior to cath. CBC stable.  Plan to restart heparin 2 hours after TR band removed (removal documented on 3/25@1900 ).   Goal of Therapy:  Heparin level 0.3-0.7 units/ml Monitor platelets by anticoagulation protocol: Yes   Plan:  Restart heparin infusion at 950 units/hr on 3/25@2100  Will order  heparin level in 8 hours after restart Monitor daily HL, CBC, and for s/sx of bleeding  Antonietta Jewel, PharmD, BCCCP Clinical Pharmacist  Phone: [phone] 08/16/2022 5:27 PM  Please check AMION for all [place] phone numbers After 10:00 PM, call [place] [phone]

## 2022-08-16 NOTE — Interval H&P Note (Signed)
History and Physical Interval Note:  08/16/2022 3:52 PM  Jeremy Waters  has presented today for surgery, with the diagnosis of nstemi.  The various methods of treatment have been discussed with the patient and family. After consideration of risks, benefits and other options for treatment, the patient has consented to  Procedure(s): LEFT HEART CATH AND CORONARY ANGIOGRAPHY (N/A) as a surgical intervention.  The patient's history has been reviewed, patient examined, no change in status, stable for surgery.  I have reviewed the patient's chart and labs.  Questions were answered to the patient's satisfaction.     [name]

## 2022-08-16 NOTE — TOC Benefit Eligibility Note (Signed)
Patient Teacher, English as a foreign language completed.    The patient is currently admitted and upon discharge could be taking Eliquis.  The current 30 day co-pay is $47.00.    Patient Teacher, English as a foreign language completed.    The patient is currently admitted and upon discharge could be taking Xarelto.  The current 30 day co-pay is $47.00.   The patient is insured through [employer] Medicare Part D   This test claim was processed through [place] amounts may vary at other pharmacies due to pharmacy/plan contracts, or as the patient moves through the different stages of their insurance plan.

## 2022-08-17 ENCOUNTER — Inpatient Hospital Stay (HOSPITAL_COMMUNITY): Payer: PPO

## 2022-08-17 ENCOUNTER — Encounter (HOSPITAL_COMMUNITY): Payer: Self-pay | Admitting: Internal Medicine

## 2022-08-17 DIAGNOSIS — I350 Nonrheumatic aortic (valve) stenosis: Secondary | ICD-10-CM

## 2022-08-17 DIAGNOSIS — I2510 Atherosclerotic heart disease of native coronary artery without angina pectoris: Secondary | ICD-10-CM

## 2022-08-17 DIAGNOSIS — I214 Non-ST elevation (NSTEMI) myocardial infarction: Secondary | ICD-10-CM | POA: Diagnosis not present

## 2022-08-17 DIAGNOSIS — Z01810 Encounter for preprocedural cardiovascular examination: Secondary | ICD-10-CM | POA: Diagnosis not present

## 2022-08-17 DIAGNOSIS — I480 Paroxysmal atrial fibrillation: Secondary | ICD-10-CM

## 2022-08-17 LAB — LIPID PANEL
Cholesterol: 159 mg/dL (ref 0–200)
HDL: 61 mg/dL (ref >40)
LDL Cholesterol: 85 mg/dL (ref 0–99)
Total CHOL/HDL Ratio: 2.6 RATIO
Triglycerides: 63 mg/dL (ref <150)
VLDL: 13 mg/dL (ref 0–40)

## 2022-08-17 LAB — HEPARIN LEVEL (UNFRACTIONATED)
Heparin Unfractionated: 0.17 IU/mL — ABNORMAL LOW (ref 0.30–0.70)
Heparin Unfractionated: 0.33 IU/mL (ref 0.30–0.70)

## 2022-08-17 LAB — CBC
HCT: 40.2 % (ref 39.0–52.0)
Hemoglobin: 13.7 g/dL (ref 13.0–17.0)
MCH: 33.3 pg (ref 26.0–34.0)
MCHC: 34.1 g/dL (ref 30.0–36.0)
MCV: 97.6 fL (ref 80.0–100.0)
Platelets: 213 10*3/uL (ref 150–400)
RBC: 4.12 MIL/uL — ABNORMAL LOW (ref 4.22–5.81)
RDW: 14.2 % (ref 11.5–15.5)
WBC: 7.7 10*3/uL (ref 4.0–10.5)
nRBC: 0 % (ref 0.0–0.2)

## 2022-08-17 LAB — HEMOGLOBIN A1C
Hgb A1c MFr Bld: 5.4 % (ref 4.8–5.6)
Mean Plasma Glucose: 108 mg/dL

## 2022-08-17 LAB — TYPE AND SCREEN
ABO/RH(D): A POS
Antibody Screen: NEGATIVE

## 2022-08-17 LAB — SURGICAL PCR SCREEN
MRSA, PCR: NEGATIVE
Staphylococcus aureus: POSITIVE — AB

## 2022-08-17 LAB — SARS CORONAVIRUS 2 BY RT PCR: SARS Coronavirus 2 by RT PCR: NEGATIVE

## 2022-08-17 LAB — LIPOPROTEIN A (LPA): Lipoprotein (a): 12.3 nmol/L (ref <75.0)

## 2022-08-17 MED ORDER — PLASMA-LYTE A IV SOLN
INTRAVENOUS | Status: DC
  Filled 2022-08-17: qty 2.5

## 2022-08-17 MED ORDER — HEPARIN 30,000 UNITS/1000 ML (OHS) CELLSAVER SOLUTION
Status: DC
  Filled 2022-08-17: qty 1000

## 2022-08-17 MED ORDER — CHLORHEXIDINE GLUCONATE 4 % EX LIQD
60.0000 mL | Freq: Once | CUTANEOUS | Status: AC
  Administered 2022-08-18: 4 via TOPICAL
  Filled 2022-08-17: qty 60

## 2022-08-17 MED ORDER — MILRINONE LACTATE IN DEXTROSE 20-5 MG/100ML-% IV SOLN
0.3000 ug/kg/min | INTRAVENOUS | Status: DC
  Filled 2022-08-17: qty 100

## 2022-08-17 MED ORDER — CEFAZOLIN SODIUM-DEXTROSE 2-4 GM/100ML-% IV SOLN
2.0000 g | INTRAVENOUS | Status: DC
  Filled 2022-08-17: qty 100

## 2022-08-17 MED ORDER — TRANEXAMIC ACID 1000 MG/10ML IV SOLN
1.5000 mg/kg/h | INTRAVENOUS | Status: AC
  Administered 2022-08-18: 1.5 mg/kg/h via INTRAVENOUS
  Filled 2022-08-17: qty 25

## 2022-08-17 MED ORDER — TRANEXAMIC ACID (OHS) BOLUS VIA INFUSION
15.0000 mg/kg | INTRAVENOUS | Status: AC
  Administered 2022-08-18: 985.5 mg via INTRAVENOUS
  Filled 2022-08-17: qty 986

## 2022-08-17 MED ORDER — PHENYLEPHRINE HCL-NACL 20-0.9 MG/250ML-% IV SOLN
30.0000 ug/min | INTRAVENOUS | Status: AC
  Administered 2022-08-18: 25 ug/min via INTRAVENOUS
  Filled 2022-08-17: qty 250

## 2022-08-17 MED ORDER — VANCOMYCIN HCL 1000 MG IV SOLR
INTRAVENOUS | Status: DC
  Filled 2022-08-17: qty 20

## 2022-08-17 MED ORDER — BISACODYL 5 MG PO TBEC: 5.0000 mg | DELAYED_RELEASE_TABLET | Freq: Once | ORAL | Status: DC

## 2022-08-17 MED ORDER — DEXMEDETOMIDINE HCL IN NACL 400 MCG/100ML IV SOLN
0.1000 ug/kg/h | INTRAVENOUS | Status: AC
  Administered 2022-08-18: .7 ug/kg/h via INTRAVENOUS
  Filled 2022-08-17: qty 100

## 2022-08-17 MED ORDER — CHLORHEXIDINE GLUCONATE 4 % EX LIQD
60.0000 mL | Freq: Once | CUTANEOUS | Status: AC
  Administered 2022-08-17: 4 via TOPICAL
  Filled 2022-08-17: qty 60

## 2022-08-17 MED ORDER — MANNITOL 20 % IV SOLN
INTRAVENOUS | Status: DC
  Filled 2022-08-17: qty 13

## 2022-08-17 MED ORDER — EPINEPHRINE HCL 5 MG/250ML IV SOLN IN NS
0.0000 ug/min | INTRAVENOUS | Status: AC
  Administered 2022-08-18: 2 ug/min via INTRAVENOUS
  Filled 2022-08-17: qty 250

## 2022-08-17 MED ORDER — VANCOMYCIN HCL 1250 MG/250ML IV SOLN
1250.0000 mg | INTRAVENOUS | Status: AC
  Administered 2022-08-18: 1250 mg via INTRAVENOUS
  Filled 2022-08-17: qty 250

## 2022-08-17 MED ORDER — METOPROLOL TARTRATE 12.5 MG HALF TABLET: 12.5000 mg | ORAL_TABLET | Freq: Once | ORAL | Status: DC

## 2022-08-17 MED ORDER — CEFAZOLIN SODIUM-DEXTROSE 2-4 GM/100ML-% IV SOLN
2.0000 g | INTRAVENOUS | Status: AC
  Administered 2022-08-18 (×2): 2 g via INTRAVENOUS
  Filled 2022-08-17: qty 100

## 2022-08-17 MED ORDER — CHLORHEXIDINE GLUCONATE 0.12 % MT SOLN
15.0000 mL | Freq: Once | OROMUCOSAL | Status: AC
  Administered 2022-08-18: 15 mL via OROMUCOSAL
  Filled 2022-08-17: qty 15

## 2022-08-17 MED ORDER — TRANEXAMIC ACID (OHS) PUMP PRIME SOLUTION
2.0000 mg/kg | INTRAVENOUS | Status: DC
  Filled 2022-08-17: qty 1.31

## 2022-08-17 MED ORDER — POTASSIUM CHLORIDE 2 MEQ/ML IV SOLN
80.0000 meq | INTRAVENOUS | Status: DC
  Filled 2022-08-17: qty 40

## 2022-08-17 MED ORDER — TEMAZEPAM 15 MG PO CAPS
15.0000 mg | ORAL_CAPSULE | Freq: Once | ORAL | Status: DC | PRN
  Filled 2022-08-17: qty 1

## 2022-08-17 MED ORDER — NOREPINEPHRINE 4 MG/250ML-% IV SOLN
0.0000 ug/min | INTRAVENOUS | Status: AC
  Administered 2022-08-18: 2 ug/min via INTRAVENOUS
  Filled 2022-08-17: qty 250

## 2022-08-17 MED ORDER — INSULIN REGULAR(HUMAN) IN NACL 100-0.9 UT/100ML-% IV SOLN
INTRAVENOUS | Status: AC
  Administered 2022-08-18: .9 [IU]/h via INTRAVENOUS
  Filled 2022-08-17: qty 100

## 2022-08-17 MED ORDER — NITROGLYCERIN IN D5W 200-5 MCG/ML-% IV SOLN
2.0000 ug/min | INTRAVENOUS | Status: AC
  Administered 2022-08-18: 10 ug/min via INTRAVENOUS
  Filled 2022-08-17: qty 250

## 2022-08-17 NOTE — Progress Notes (Signed)
ANTICOAGULATION CONSULT NOTE  Pharmacy Consult for heparin  Indication: chest pain/ACS, afib  Allergies  Allergen Reactions   Atorvastatin Other (See Comments)    Increased LFTs    Patient Measurements: Height: 5\' 7"  (170.2 cm) Weight: 65.7 kg (144 lb 13.5 oz) IBW/kg (Calculated) : 66.1 Heparin Dosing Weight: 65.8kg   Vital Signs: Temp: 97.8 F (36.6 C) (03/26 2004) Temp Source: Oral (03/26 2004) BP: 142/86 (03/26 2004) Pulse Rate: 53 (03/26 2004)  Labs: Recent Labs    08/15/22 1631 08/15/22 1631 08/15/22 1803 08/15/22 2024 08/15/22 2202 08/16/22 0219 08/16/22 1045 08/17/22 0605 08/17/22 1800  HGB 14.6  --   --   --   --  14.7  --  13.7  --   HCT 42.8  --   --   --   --  42.2  --  40.2  --   PLT 262  --   --   --   --  239  --  213  --   HEPARINUNFRC  --    < >  --   --   --  0.19* 0.29* 0.17* 0.33  CREATININE 0.89  --   --   --   --  0.87  --   --   --   TROPONINIHS 581*  --  1,032* 1,518* 2,023*  --   --   --   --    < > = values in this interval not displayed.     Estimated Creatinine Clearance: 68.2 mL/min (by C-G formula based on SCr of 0.87 mg/dL).   Medical History: Past Medical History:  Diagnosis Date   Adenomatous colon polyp 09/2007   Arthritis    OA / PAIN LEFT KNEE   Cancer ([place])    prostate cancer   Cataract    removed both eyes   COVID-19 virus infection    Diverticulosis of colon    w/o hemorrage   Heart murmur    TOLD HE HAS A SLIGHT MURMUR   Hyperlipidemia    Inguinal hernia    left side    Thyroid disease     Assessment: Patient presenting due to [place] alerting him that he was in Afib. No hx of Afib prior, reports no chest pain however troponin elevated to 581. No anticoag PTA, pharmacy consulted to dose heparin.   Cath 3/25 showing stenosis in LAD, RCA, and Cx - plan for CTVS consult. Heparin level this evening came back therapeutic at 0.33, on 1100 units/hr. No s/sx of bleeding or infusion issues.   Goal of Therapy:   Heparin level 0.3-0.7 units/ml Monitor platelets by anticoagulation protocol: Yes   Plan:  -Continue heparin infusion at 1100 units/hr -Heparin level daily with CBC   Antonietta Jewel, PharmD, [place] Pharmacist  Phone: [phone] 08/17/2022 8:23 PM  Please check AMION for all [place] phone numbers After 10:00 PM, call [place] [phone]

## 2022-08-17 NOTE — Progress Notes (Signed)
Rounding Note    Patient Name: Jeremy Waters Date of Encounter: 08/17/2022  [place] Cardiologist: None   Subjective   Denies chest pain or shortness of breath.  Postop day 1 left heart cath.  Inpatient Medications    Scheduled Meds:  [START ON 08/18/2022] aspirin EC  81 mg Oral Daily   levothyroxine  50 mcg Oral QAC breakfast   metoprolol tartrate  25 mg Oral QID   rosuvastatin  40 mg Oral Daily   sodium chloride flush  3 mL Intravenous Q12H   sodium chloride flush  3 mL Intravenous Q12H   Continuous Infusions:  sodium chloride     heparin 1,100 Units/hr (08/17/22 1012)   PRN Meds: sodium chloride, acetaminophen, acetaminophen, nitroGLYCERIN, ondansetron (ZOFRAN) IV, ondansetron (ZOFRAN) IV, sodium chloride flush   Vital Signs    Vitals:   08/17/22 0100 08/17/22 0300 08/17/22 0755 08/17/22 1000  BP: 99/64 97/64 (!) 87/54 130/71  Pulse: (!) 51 60 (!) 55   Resp: 12 16 14 16   Temp:  97.7 F (36.5 C) (!) 97.5 F (36.4 C)   TempSrc:  Oral Oral   SpO2: 96% 95% 97% 98%  Weight:      Height:        Intake/Output Summary (Last 24 hours) at 08/17/2022 1019 Last data filed at 08/17/2022 0100 Gross per 24 hour  Intake 115.23 ml  Output --  Net 115.23 ml      08/16/2022   11:35 AM 08/15/2022    7:50 PM 08/15/2022    4:19 PM  Last 3 Weights  Weight (lbs) 144 lb 13.5 oz 150 lb 4.8 oz 145 lb  Weight (kg) 65.7 kg 68.176 kg 65.772 kg      Telemetry    Normal sinus rhythm, patient converted from A-fib to sinus rhythm after heart catheterization- Personally Reviewed  ECG    Not performed today- Personally Reviewed  Physical Exam   GEN: No acute distress.   Neck: No JVD Cardiac: Irregularly irregular, no murmurs, rubs, or gallops.  Respiratory: Clear to auscultation bilaterally. GI: Soft, nontender, non-distended  MS: No edema; No deformity. Neuro:  Nonfocal  Psych: Normal affect   Labs    High Sensitivity Troponin:   Recent Labs  Lab  08/15/22 1631 08/15/22 1803 08/15/22 2024 08/15/22 2202  TROPONINIHS 581* 1,032* 1,518* 2,023*     Chemistry Recent Labs  Lab 08/15/22 1631 08/16/22 0219  NA 139 137  K 3.9 3.8  CL 105 106  CO2 25 25  GLUCOSE 106* 109*  BUN 14 13  CREATININE 0.89 0.87  CALCIUM 10.3 8.9  MG 2.2  --   PROT 7.2  --   ALBUMIN 4.5  --   AST 44*  --   ALT 24  --   ALKPHOS 38  --   BILITOT 0.5  --   GFRNONAA >60 >60  ANIONGAP 9 6    Lipids  Recent Labs  Lab 08/17/22 0605  CHOL 159  TRIG 63  HDL 61  LDLCALC 85  CHOLHDL 2.6    Hematology Recent Labs  Lab 08/15/22 1631 08/16/22 0219 08/17/22 0605  WBC 8.5 7.8 7.7  RBC 4.44 4.39 4.12*  HGB 14.6 14.7 13.7  HCT 42.8 42.2 40.2  MCV 96.4 96.1 97.6  MCH 32.9 33.5 33.3  MCHC 34.1 34.8 34.1  RDW 14.1 14.0 14.2  PLT 262 239 213   Thyroid  Recent Labs  Lab 08/15/22 1631  TSH 1.348  FREET4 0.89  BNP Recent Labs  Lab 08/15/22 1631  BNP 352.2*    DDimer No results for input(s): "DDIMER" in the last 168 hours.   Radiology    CARDIAC CATHETERIZATION  Result Date: 08/16/2022   Ost LAD to Prox LAD lesion is 90% stenosed.   Dist RCA lesion is 95% stenosed.   Mid RCA lesion is 80% stenosed.   Dist Cx lesion is 90% stenosed.   Mid LAD lesion is 65% stenosed. 1.  High-grade serial lesions of right coronary artery. 2.  High-grade calcified proximal LAD stenosis; the LAD is a relatively calcified vessel and require long segment stenting peripheral revascularization. 3.  High-grade distal left circumflex disease. 4.  LVEDP of 3 mmHg. Recommendations: The results were reviewed with Dr. Gwenlyn Found, a cardiothoracic surgical consult will be obtained.   ECHOCARDIOGRAM COMPLETE  Result Date: 08/16/2022    ECHOCARDIOGRAM REPORT   Patient Name:   Jeremy Waters Date of Exam: 08/16/2022 Medical Rec #:  [id number]       Height:       67.0 in Accession #:    [id number]      Weight:       150.3 lb Date of Birth:  January 18, 1946       BSA:          1.791 m  Patient Age:    77 years        BP:           115/97 mmHg Patient Gender: M               HR:           80 bpm. Exam Location:  Inpatient Procedure: 2D Echo, Cardiac Doppler and Color Doppler Indications:    I48.91* Unspeicified atrial fibrillation  History:        Patient has prior history of Echocardiogram examinations, most                 recent 03/05/2021. Arrythmias:Atrial Fibrillation; Risk                 Factors:Dyslipidemia.  Sonographer:    Bernadene Person RDCS Referring Phys: [id number] [place]  1. Left ventricular ejection fraction, by estimation, is 50 to 55%. The left ventricle has low normal function. The left ventricle has no regional wall motion abnormalities. Left ventricular diastolic function could not be evaluated.  2. Right ventricular systolic function is mildly reduced. The right ventricular size is normal. There is normal pulmonary artery systolic pressure.  3. Left atrial size was moderately dilated.  4. Right atrial size was moderately dilated.  5. The mitral valve is normal in structure. Trivial mitral valve regurgitation. No evidence of mitral stenosis.  6. The aortic valve is moderately to severely calcified. The right coronary cusp is fused. By gradient AS in mild but visually AS appears severe and this is supported by the calculated AVA and dimensionless index. With low SV index suspect paradoxical low-flow low-gradient AS. The aortic valve is tricuspid. There is moderate calcification of the aortic valve. Aortic valve regurgitation is trivial. No aortic stenosis is present. Aortic valve area, by VTI measures 0.96 cm. Aortic valve mean gradient measures 12.0 mmHg. Aortic valve Vmax measures 2.45 m/s.  7. Aortic dilatation noted. There is borderline dilatation of the ascending aorta, measuring 38 mm.  8. The inferior vena cava is normal in size with greater than 50% respiratory variability, suggesting right atrial pressure of 3 mmHg. FINDINGS  Left  Ventricle: Left ventricular ejection fraction, by estimation, is 50 to 55%. The left ventricle has low normal function. The left ventricle has no regional wall motion abnormalities. The left ventricular internal cavity size was normal in size. There is no left ventricular hypertrophy. Left ventricular diastolic function could not be evaluated due to atrial fibrillation. Left ventricular diastolic function could not be evaluated. Right Ventricle: The right ventricular size is normal. No increase in right ventricular wall thickness. Right ventricular systolic function is mildly reduced. There is normal pulmonary artery systolic pressure. The tricuspid regurgitant velocity is 1.82 m/s, and with an assumed right atrial pressure of 3 mmHg, the estimated right ventricular systolic pressure is [id number] mmHg. Left Atrium: Left atrial size was moderately dilated. Right Atrium: Right atrial size was moderately dilated. Pericardium: There is no evidence of pericardial effusion. Mitral Valve: The mitral valve is normal in structure. Trivial mitral valve regurgitation. No evidence of mitral valve stenosis. Tricuspid Valve: The tricuspid valve is normal in structure. Tricuspid valve regurgitation is trivial. No evidence of tricuspid stenosis. Aortic Valve: The aortic valve is moderately to severely calcified. The right coronary cusp is fused. By gradient AS in mild but visually AS appears severe and this is supported by the calculated AVA and dimensionless index. With low SV index suspect paradoxical low-flow low-gradient AS. The aortic valve is tricuspid. There is moderate calcification of the aortic valve. Aortic valve regurgitation is trivial. Aortic regurgitation PHT measures 504 msec. No aortic stenosis is present. Aortic valve mean gradient measures 12.0 mmHg. Aortic valve peak gradient measures 23.9 mmHg. Aortic valve area, by VTI measures 0.96 cm. Pulmonic Valve: The pulmonic valve was normal in structure. Pulmonic valve  regurgitation is not visualized. No evidence of pulmonic stenosis. Aorta: Aortic dilatation noted. There is borderline dilatation of the ascending aorta, measuring 38 mm. Venous: The inferior vena cava is normal in size with greater than 50% respiratory variability, suggesting right atrial pressure of 3 mmHg. IAS/Shunts: There is right bowing of the interatrial septum, suggestive of elevated left atrial pressure. No atrial level shunt detected by color flow Doppler.  LEFT VENTRICLE PLAX 2D LVIDd:         5.30 cm LVIDs:         3.40 cm LV PW:         0.80 cm LV IVS:        0.70 cm LVOT diam:     2.00 cm LV SV:         42 LV SV Index:   24 LVOT Area:     3.14 cm  LV Volumes (MOD) LV vol d, MOD A2C: 74.5 ml LV vol d, MOD A4C: 84.5 ml LV vol s, MOD A2C: 35.5 ml LV vol s, MOD A4C: 39.7 ml LV SV MOD A2C:     39.0 ml LV SV MOD A4C:     84.5 ml LV SV MOD BP:      42.0 ml RIGHT VENTRICLE TAPSE (M-mode): 2.5 cm LEFT ATRIUM             Index        RIGHT ATRIUM           Index LA diam:        3.70 cm 2.07 cm/m   RA Area:     19.10 cm LA Vol (A2C):   61.5 ml 34.34 ml/m  RA Volume:   53.70 ml  29.98 ml/m LA Vol (A4C):   60.9 ml 34.00 ml/m LA Biplane Vol:  66.7 ml 37.24 ml/m  AORTIC VALVE AV Area (Vmax):    0.94 cm AV Area (Vmean):   0.95 cm AV Area (VTI):     0.96 cm AV Vmax:           244.50 cm/s AV Vmean:          157.500 cm/s AV VTI:            0.438 m AV Peak Grad:      23.9 mmHg AV Mean Grad:      12.0 mmHg LVOT Vmax:         73.25 cm/s LVOT Vmean:        47.450 cm/s LVOT VTI:          0.134 m LVOT/AV VTI ratio: 0.31 AI PHT:            504 msec  AORTA Ao Root diam: 3.30 cm Ao Asc diam:  3.80 cm TRICUSPID VALVE TR Peak grad:   13.2 mmHg TR Vmax:        182.00 cm/s  SHUNTS Systemic VTI:  0.13 m Systemic Diam: 2.00 cm Glori Bickers MD Electronically signed by Glori Bickers MD Signature Date/Time: 08/16/2022/12:29:56 PM    Final    DG Chest Portable 1 View  Result Date: 08/15/2022 CLINICAL DATA:  Chest pain.  Mild shortness of breath. Possible atrial fibrillation reported by Apple watch. EXAM: PORTABLE CHEST 1 VIEW COMPARISON:  06/15/2013 FINDINGS: The lungs appear clear. Cardiac and mediastinal contours normal. No pleural effusion identified. Mild lower thoracic spondylosis IMPRESSION: 1. No active disease. 2. Mild lower thoracic spondylosis. Electronically Signed   By: Van Clines M.D.   On: 08/15/2022 17:14    Cardiac Studies   2D echo gram (08/16/2022)  MPRESSIONS     1. Left ventricular ejection fraction, by estimation, is 50 to 55%. The  left ventricle has low normal function. The left ventricle has no regional  wall motion abnormalities. Left ventricular diastolic function could not  be evaluated.   2. Right ventricular systolic function is mildly reduced. The right  ventricular size is normal. There is normal pulmonary artery systolic  pressure.   3. Left atrial size was moderately dilated.   4. Right atrial size was moderately dilated.   5. The mitral valve is normal in structure. Trivial mitral valve  regurgitation. No evidence of mitral stenosis.   6. The aortic valve is moderately to severely calcified. The right  coronary cusp is fused. By gradient AS in mild but visually AS appears  severe and this is supported by the calculated AVA and dimensionless  index. With low SV index suspect paradoxical  low-flow low-gradient AS. The aortic valve is tricuspid. There is moderate  calcification of the aortic valve. Aortic valve regurgitation is trivial.  No aortic stenosis is present. Aortic valve area, by VTI measures 0.96  cm. Aortic valve mean gradient  measures 12.0 mmHg. Aortic valve Vmax measures 2.45 m/s.   7. Aortic dilatation noted. There is borderline dilatation of the  ascending aorta, measuring 38 mm.   8. The inferior vena cava is normal in size with greater than 50%  respiratory variability, suggesting right atrial pressure of 3 mmHg.    Cardiac catheterization  (08/16/2022)  Conclusion      Ost LAD to Prox LAD lesion is 90% stenosed.   Dist RCA lesion is 95% stenosed.   Mid RCA lesion is 80% stenosed.   Dist Cx lesion is 90% stenosed.   Mid LAD lesion  is 65% stenosed.   1.  High-grade serial lesions of right coronary artery. 2.  High-grade calcified proximal LAD stenosis; the LAD is a relatively calcified vessel and require long segment stenting peripheral revascularization. 3.  High-grade distal left circumflex disease. 4.  LVEDP of 3 mmHg.   Recommendations: The results were reviewed with Dr. Gwenlyn Found, a cardiothoracic surgical consult will be obtained.   Intervention  Patient Profile     77 y.o. thin-appearing married Caucasian male admitted with A-fib with RVR.  He does have a history of hyperlipidemia.  His troponins steadily rose to over 2000.  It is possible that his left shoulder/arm pain is "an anginal equivalent".  He underwent left heart cath by Dr.Thukkani yesterday revealing three-vessel disease . Assessment & Plan    1: Non-STEMI-troponin steadily increased up to 2000.  Cardiac catheterization revealed three-vessel disease.  His EF was normal but his echo suggested moderate aortic stenosis.  A T CTS consult was placed for CABG plus or minus AVR.  He is not a diabetic and would benefit from an "all arterial conduit operation".  2: Hyperlipidemia-on high-dose Crestor as outpatient lipid profile 2 days ago revealed total cholesterol 159 with an LDL of 85.  LDL goal of less than 70.  Will need to adjust lipid-lowering meds as an outpatient post bypass  3: PAF-currently in A-fib with ventricular sponsor of 80-90 on IV heparin.The CHA2DSVASC2 score is  3 .  He will need to be on a DOAC at discharge.  He converted to sinus rhythm after the cardiac catheterization now with a rate in the 60s.  It may be worth doing a left atrial appendage clipping at the time of open heart surgery.  T CTS has been consulted.  Hopefully they will see him  today.  He is back on heparin and is in sinus rhythm, completely asymptomatic.  Await input for timing of revascularization.     For questions or updates, please contact [place] Please consult [email or web address] for contact info under        Signed, Quay Burow, MD  08/17/2022, 10:19 AM

## 2022-08-17 NOTE — Progress Notes (Signed)
Pre-CABG ultrasound attempted. Patient going to CT, and will come to vascular lab after. Spoke w/ RN.   Darlin Coco, RDMS, RVT

## 2022-08-17 NOTE — Plan of Care (Signed)
  Problem: Education: Goal: Knowledge of General Education information will improve Description: Including pain rating scale, medication(s)/side effects and non-pharmacologic comfort measures Outcome: Progressing   Problem: Activity: Goal: Risk for activity intolerance will decrease Outcome: Progressing   Problem: Nutrition: Goal: Adequate nutrition will be maintained Outcome: Progressing   

## 2022-08-17 NOTE — Progress Notes (Signed)
ANTICOAGULATION CONSULT NOTE  Pharmacy Consult for heparin  Indication: chest pain/ACS, afib  Allergies  Allergen Reactions   Atorvastatin Other (See Comments)    Increased LFTs    Patient Measurements: Height: 5\' 7"  (170.2 cm) Weight: 65.7 kg (144 lb 13.5 oz) IBW/kg (Calculated) : 66.1 Heparin Dosing Weight: 65.8kg   Vital Signs: Temp: 97.7 F (36.5 C) (03/26 0300) Temp Source: Oral (03/26 0300) BP: 97/64 (03/26 0300) Pulse Rate: 60 (03/26 0300)  Labs: Recent Labs    08/15/22 1631 08/15/22 1803 08/15/22 2024 08/15/22 2202 08/16/22 0219 08/16/22 1045 08/17/22 0605  HGB 14.6  --   --   --  14.7  --  13.7  HCT 42.8  --   --   --  42.2  --  40.2  PLT 262  --   --   --  239  --  213  HEPARINUNFRC  --   --   --   --  0.19* 0.29* 0.17*  CREATININE 0.89  --   --   --  0.87  --   --   TROPONINIHS 581* 1,032* 1,518* 2,023*  --   --   --      Estimated Creatinine Clearance: 68.2 mL/min (by C-G formula based on SCr of 0.87 mg/dL).   Medical History: Past Medical History:  Diagnosis Date   Adenomatous colon polyp 09/2007   Arthritis    OA / PAIN LEFT KNEE   Cancer ([place])    prostate cancer   Cataract    removed both eyes   COVID-19 virus infection    Diverticulosis of colon    w/o hemorrage   Heart murmur    TOLD HE HAS A SLIGHT MURMUR   Hyperlipidemia    Inguinal hernia    left side    Thyroid disease     Assessment: Patient presenting due to [place] alerting him that he was in Afib. No hx of Afib prior, reports no chest pain however troponin elevated to 581. No anticoag PTA, pharmacy consulted to dose heparin.   Cath 3/25 showing stenosis in LAD, RCA, and Cx - plan for CTVS consult. Heparin was restarted post procedure -heparin level = 0.17 on 950 units/hr  Goal of Therapy:  Heparin level 0.3-0.7 units/ml Monitor platelets by anticoagulation protocol: Yes   Plan:  -Increase heparin to 1100 units/hr -Heparin level in 8 hours and daily wth CBC  daily  Hildred Laser, PharmD Clinical Pharmacist **Pharmacist phone directory can now be found on [email or web address] ([email or web address]).  Listed under [place].

## 2022-08-17 NOTE — Consult Note (Addendum)
[address]       [place] [address]             [phone]        Cairo B Locascio [place] Medical Record [id number] Date of Birth: 1945/10/14  Referring: No ref. provider found Primary Care: Binnie Rail, MD Primary Cardiologist:None  Chief Complaint:    Chief Complaint  Patient presents with   Irregular Heart Beat    History of Present Illness:   We are asked to see this 77 year old male in cardiothoracic surgical consultation for consideration of coronary artery surgical revascularization.  Patient presented to the emergency department on 08/15/2022 after his Apple Watch alerted him that he could possibly be in atrial fibrillation.  Upon reviewing his app it stated that he had had intermittent A-fib for the past several hours.  He did not note any symptoms but because of this finding he was concerned enough to go to the emergency department for further evaluation.  He did note that he has had some intermittent left shoulder/arm pain recently that was nonexertional.  The symptoms were present typically in the morning and last for 30 minutes to 1 hour.  He felt the symptoms were most likely musculoskeletal and he did not see physical therapy which she did feel improved symptoms.  The patient is physically active and uses the elliptical frequently in the morning and these symptoms were not exacerbated during its use.  Additionally he did not note any significant decrease in his exercise capacity.  In the emergency department he was noted to be in atrial fibrillation with RVR in the 100 to 130 bpm range.  In the emergency department abnormal labs included an elevated BNP of 352 as well as elevated high-sensitivity troponin which trended higher over time to 2000 as he ruled in for non-STEMI.  He was started on aspirin as well as a heparin drip and cardiology consultation was obtained to assist with management and further diagnostic evaluation.  Previous cardiac history  includes symptomatic PVCs followed by Dr. Harl Bowie.  He also has reportedly mild aortic stenosis.  Additionally he has a history of hyperlipidemia and extracranial cerebrovascular occlusive disease.  He is a former cigarette smoker but did quit in 1983.  Following that he did smoke cigars up until 2013, typically 1/day.  His CHA2DS2-VASc score is 3.  Echocardiogram showed his left ventricular ejection fraction by estimation to be 50 to 55%.  By gradient his aortic stenosis was mild but the valve was moderately to severely calcified and visually it appeared the aortic stenosis was severe which was supported by the calculated aortic valve area and dimensionless index.  The full report as described below.  Cardiac catheterization is also been performed and this reveals severe three-vessel coronary artery disease.  LVEDP was measured at 3 mmHg.  He denies any dental symptoms and has seen dentist recently.    Current Activity/ Functional Status: Patient is independent with mobility/ambulation, transfers, ADL's, IADL's.   Zubrod Score: At the time of surgery this patient's most appropriate activity status/level should be described as: []     0    Normal activity, no symptoms []     1    Restricted in physical strenuous activity but ambulatory, able to do out light work [x]     2    Ambulatory and capable of self care, unable to do work activities, up and about  more than 50%  Of the time                            []     3    Only limited self care, in bed greater than 50% of waking hours []     4    Completely disabled, no self care, confined to bed or chair []     5    Moribund  Past Medical History:  Diagnosis Date   Adenomatous colon polyp 09/2007   Arthritis    OA / PAIN LEFT KNEE   Cancer ([place])    prostate cancer   Cataract    removed both eyes   COVID-19 virus infection    Diverticulosis of colon    w/o hemorrage   Heart murmur    TOLD HE HAS A SLIGHT MURMUR   Hyperlipidemia     Inguinal hernia    left side    Thyroid disease     Past Surgical History:  Procedure Laterality Date   BACK SURGERY  05/12/2005   L5   COLONOSCOPY  1999   Negative; Dr Fuller Plan   COLONOSCOPY  2004   tics, hemorrhoids   COLONOSCOPY  2009   polyps ([place]   [place]  10/2003   KNEE SURGERY  1969   Patellar fracture fragment Lt   LAMINECTOMY  2000   L4-5   LEFT HEART CATH AND CORONARY ANGIOGRAPHY N/A 08/16/2022   Procedure: LEFT HEART CATH AND CORONARY ANGIOGRAPHY;  Surgeon: [name], MD;  Location: [facility];  Service: Cardiovascular;  Laterality: N/A;   LUMBAR LAMINECTOMY/DECOMPRESSION MICRODISCECTOMY Right 10/08/2015   Procedure: MICRO LUMBAR DECOMPRESSION L4-L5 AND L5-S1 ON RIGHT ;  Surgeon: Susa Day, MD;  Location: [facility];  Service: Orthopedics;  Laterality: Right;   MASS EXCISION  04/26/2011   Procedure: MINOR EXCISION OF MASS;  Surgeon: Earnstine Regal, MD;  Location: [facility];  Service: General;  Laterality: Right;  Excise subcutaneous mass right axilla Minor Room   POLYPECTOMY     PROSTATECTOMY  04/16/2005   TONSILLECTOMY     TOTAL KNEE ARTHROPLASTY Left 07/03/2013   Procedure: LEFT TOTAL KNEE ARTHROPLASTY;  Surgeon: Mauri Pole, MD;  Location: [facility];  Service: Orthopedics;  Laterality: Left;  History of bilateral cataract surgery  Social History   Tobacco Use  Smoking Status Former  Smokeless Tobacco Never  Tobacco Comments   stopped smoking cigarettes 1983, 1 cigar /day 2001-2013    Social History   Substance and Sexual Activity  Alcohol Use Yes   Alcohol/week: 7.0 - 14.0 standard drinks of alcohol   Types: 7 - 14 Standard drinks or equivalent per week   Comment: scotch or wine daily     Allergies  Allergen Reactions   Atorvastatin Other (See Comments)    Increased LFTs    Current Facility-Administered Medications  Medication Dose Route Frequency Provider Last Rate Last Admin   0.9 %   sodium chloride infusion  250 mL Intravenous PRN [name], MD       acetaminophen (TYLENOL) tablet 650 mg  650 mg Oral Q4H PRN Ewing Schlein, MD       acetaminophen (TYLENOL) tablet 650 mg  650 mg Oral Q4H PRN [name], MD       [START ON 08/18/2022] aspirin EC tablet 81 mg  81 mg Oral Daily Gwenlyn Found,  Pearletha Forge, MD       heparin ADULT infusion 100 units/mL (25000 units/258mL)  1,100 Units/hr Intravenous Continuous Kris Mouton, RPH 9.5 mL/hr at 08/17/22 0100 950 Units/hr at 08/17/22 0100   levothyroxine (SYNTHROID) tablet 50 mcg  50 mcg Oral QAC breakfast Ewing Schlein, MD   50 mcg at 08/17/22 0553   metoprolol tartrate (LOPRESSOR) tablet 25 mg  25 mg Oral QID Ewing Schlein, MD   25 mg at 08/16/22 1834   nitroGLYCERIN (NITROSTAT) SL tablet 0.4 mg  0.4 mg Sublingual Q5 Min x 3 PRN Ewing Schlein, MD       ondansetron [facility]) injection 4 mg  4 mg Intravenous Q6H PRN Ewing Schlein, MD       ondansetron [facility]) injection 4 mg  4 mg Intravenous Q6H PRN [name], MD       rosuvastatin (CRESTOR) tablet 40 mg  40 mg Oral Daily Ewing Schlein, MD   40 mg at 08/16/22 1021   sodium chloride flush (NS) 0.9 % injection 3 mL  3 mL Intravenous Q12H Lorretta Harp, MD   3 mL at 08/16/22 2208   sodium chloride flush (NS) 0.9 % injection 3 mL  3 mL Intravenous Q12H [name], MD   3 mL at 08/16/22 2208   sodium chloride flush (NS) 0.9 % injection 3 mL  3 mL Intravenous PRN [name], MD        Medications Prior to Admission  Medication Sig Dispense Refill Last Dose   beta carotene 25000 UNIT capsule Take 25,000 Units by mouth daily.   08/15/2022   clonazePAM (KLONOPIN) 0.5 MG tablet TAKE ONE TABLET BY MOUTH AT BEDTIME AS NEEDED (Patient taking differently: Take 0.5 mg by mouth at bedtime as needed (Sleep).) 30 tablet 0 Past Month   fluticasone (FLONASE) 50 MCG/ACT nasal spray SPRAY ONE SPRAY IN  EACH NOSTRIL ONCE DAILY (Patient taking differently: Place 1 spray into both nostrils daily.) 16 mL 3 08/15/2022   glycopyrrolate (ROBINUL) 2 MG tablet Take 1 tablet (2 mg total) by mouth every morning. 90 tablet 0 08/15/2022   ibuprofen (ADVIL) 200 MG tablet Take 200 mg by mouth every 6 (six) hours as needed for moderate pain.   Past Week   levothyroxine (SYNTHROID) 50 MCG tablet Take 1 tablet by mouth daily 90 tablet 2 08/15/2022   pyridOXINE (VITAMIN B-6) 100 MG tablet Take 100 mg by mouth daily.   08/15/2022   rosuvastatin (CRESTOR) 40 MG tablet Take 1 tablet (40 mg total) by mouth daily. 30 tablet 5 08/15/2022   vitamin C (ASCORBIC ACID) 500 MG tablet Take 500 mg by mouth 2 (two) times daily.   08/15/2022   vitamin E 400 UNIT capsule Take 400 Units by mouth daily.   08/15/2022    Family History  Problem Relation Age of Onset   Lung cancer Mother    COPD Mother    Cancer Father        Bladder Cancer   Prostate cancer Father    Colon cancer Father 60   Urolithiasis Father    Lung cancer Sister    Alcohol abuse Paternal Uncle    Stroke Paternal Uncle 61   Hypertension Paternal Uncle    Asthma Neg Hx    Heart disease Neg Hx    Esophageal cancer Neg Hx    Stomach cancer Neg Hx    Liver disease Neg Hx  Colon polyps Neg Hx    Rectal cancer Neg Hx      Review of Systems:   Review of Systems  Constitutional: Negative.   HENT: Negative.    Eyes:        History of bilateral cataract surgery  Respiratory: Negative.    Cardiovascular: Negative.   Gastrointestinal:  Positive for constipation and heartburn.  Genitourinary: Negative.        History of surgical prostatectomy  Musculoskeletal:  Positive for neck pain.  Skin: Negative.   Neurological:  Positive for sensory change.  Psychiatric/Behavioral:  The patient has insomnia.        Physical Exam: BP (!) 87/54 (BP Location: Left Arm)   Pulse (!) 55   Temp (!) 97.5 F (36.4 C) (Oral)   Resp 14   Ht 5\' 7"  (1.702 m)   Wt  65.7 kg   SpO2 97%   BMI 22.69 kg/m    General appearance: alert, cooperative, and no distress Head: Normocephalic, without obvious abnormality, atraumatic Neck: no adenopathy, no JVD, supple, symmetrical, trachea midline, thyroid not enlarged, symmetric, no tenderness/mass/nodules, and + right>left carotid bruit Lymph nodes: Cervical, supraclavicular, and axillary nodes normal. Resp: clear to auscultation bilaterally Back: symmetric, no curvature. ROM normal. No CVA tenderness. Cardio: regular rate and rhythm and 2/6 systolic murmur GI: soft, non-tender; bowel sounds normal; no masses,  no organomegaly Extremities: no edema, some spider veins Neurologic: Grossly normal No obvious concerning dental findings on gross examination Positive bilateral pedal pulses   Diagnostic Studies & Laboratory data:     Recent Radiology Findings:   CARDIAC CATHETERIZATION  Result Date: 08/16/2022   Ost LAD to Prox LAD lesion is 90% stenosed.   Dist RCA lesion is 95% stenosed.   Mid RCA lesion is 80% stenosed.   Dist Cx lesion is 90% stenosed.   Mid LAD lesion is 65% stenosed. 1.  High-grade serial lesions of right coronary artery. 2.  High-grade calcified proximal LAD stenosis; the LAD is a relatively calcified vessel and require long segment stenting peripheral revascularization. 3.  High-grade distal left circumflex disease. 4.  LVEDP of 3 mmHg. Recommendations: The results were reviewed with Dr. Gwenlyn Found, a cardiothoracic surgical consult will be obtained.   ECHOCARDIOGRAM COMPLETE  Result Date: 08/16/2022    ECHOCARDIOGRAM REPORT   Patient Name:   Jeremy Waters Date of Exam: 08/16/2022 Medical Rec #:  [id number]       Height:       67.0 in Accession #:    [id number]      Weight:       150.3 lb Date of Birth:  11/08/1945       BSA:          1.791 m Patient Age:    26 years        BP:           115/97 mmHg Patient Gender: M               HR:           80 bpm. Exam Location:  Inpatient Procedure: 2D Echo,  Cardiac Doppler and Color Doppler Indications:    I48.91* Unspeicified atrial fibrillation  History:        Patient has prior history of Echocardiogram examinations, most                 recent 03/05/2021. Arrythmias:Atrial Fibrillation; Risk  Factors:Dyslipidemia.  Sonographer:    Bernadene Person RDCS Referring Phys: [id number] [place]  1. Left ventricular ejection fraction, by estimation, is 50 to 55%. The left ventricle has low normal function. The left ventricle has no regional wall motion abnormalities. Left ventricular diastolic function could not be evaluated.  2. Right ventricular systolic function is mildly reduced. The right ventricular size is normal. There is normal pulmonary artery systolic pressure.  3. Left atrial size was moderately dilated.  4. Right atrial size was moderately dilated.  5. The mitral valve is normal in structure. Trivial mitral valve regurgitation. No evidence of mitral stenosis.  6. The aortic valve is moderately to severely calcified. The right coronary cusp is fused. By gradient AS in mild but visually AS appears severe and this is supported by the calculated AVA and dimensionless index. With low SV index suspect paradoxical low-flow low-gradient AS. The aortic valve is tricuspid. There is moderate calcification of the aortic valve. Aortic valve regurgitation is trivial. No aortic stenosis is present. Aortic valve area, by VTI measures 0.96 cm. Aortic valve mean gradient measures 12.0 mmHg. Aortic valve Vmax measures 2.45 m/s.  7. Aortic dilatation noted. There is borderline dilatation of the ascending aorta, measuring 38 mm.  8. The inferior vena cava is normal in size with greater than 50% respiratory variability, suggesting right atrial pressure of 3 mmHg. FINDINGS  Left Ventricle: Left ventricular ejection fraction, by estimation, is 50 to 55%. The left ventricle has low normal function. The left ventricle has no regional wall motion  abnormalities. The left ventricular internal cavity size was normal in size. There is no left ventricular hypertrophy. Left ventricular diastolic function could not be evaluated due to atrial fibrillation. Left ventricular diastolic function could not be evaluated. Right Ventricle: The right ventricular size is normal. No increase in right ventricular wall thickness. Right ventricular systolic function is mildly reduced. There is normal pulmonary artery systolic pressure. The tricuspid regurgitant velocity is 1.82 m/s, and with an assumed right atrial pressure of 3 mmHg, the estimated right ventricular systolic pressure is [id number] mmHg. Left Atrium: Left atrial size was moderately dilated. Right Atrium: Right atrial size was moderately dilated. Pericardium: There is no evidence of pericardial effusion. Mitral Valve: The mitral valve is normal in structure. Trivial mitral valve regurgitation. No evidence of mitral valve stenosis. Tricuspid Valve: The tricuspid valve is normal in structure. Tricuspid valve regurgitation is trivial. No evidence of tricuspid stenosis. Aortic Valve: The aortic valve is moderately to severely calcified. The right coronary cusp is fused. By gradient AS in mild but visually AS appears severe and this is supported by the calculated AVA and dimensionless index. With low SV index suspect paradoxical low-flow low-gradient AS. The aortic valve is tricuspid. There is moderate calcification of the aortic valve. Aortic valve regurgitation is trivial. Aortic regurgitation PHT measures 504 msec. No aortic stenosis is present. Aortic valve mean gradient measures 12.0 mmHg. Aortic valve peak gradient measures 23.9 mmHg. Aortic valve area, by VTI measures 0.96 cm. Pulmonic Valve: The pulmonic valve was normal in structure. Pulmonic valve regurgitation is not visualized. No evidence of pulmonic stenosis. Aorta: Aortic dilatation noted. There is borderline dilatation of the ascending aorta, measuring 38 mm.  Venous: The inferior vena cava is normal in size with greater than 50% respiratory variability, suggesting right atrial pressure of 3 mmHg. IAS/Shunts: There is right bowing of the interatrial septum, suggestive of elevated left atrial pressure. No atrial level shunt detected by color flow  Doppler.  LEFT VENTRICLE PLAX 2D LVIDd:         5.30 cm LVIDs:         3.40 cm LV PW:         0.80 cm LV IVS:        0.70 cm LVOT diam:     2.00 cm LV SV:         42 LV SV Index:   24 LVOT Area:     3.14 cm  LV Volumes (MOD) LV vol d, MOD A2C: 74.5 ml LV vol d, MOD A4C: 84.5 ml LV vol s, MOD A2C: 35.5 ml LV vol s, MOD A4C: 39.7 ml LV SV MOD A2C:     39.0 ml LV SV MOD A4C:     84.5 ml LV SV MOD BP:      42.0 ml RIGHT VENTRICLE TAPSE (M-mode): 2.5 cm LEFT ATRIUM             Index        RIGHT ATRIUM           Index LA diam:        3.70 cm 2.07 cm/m   RA Area:     19.10 cm LA Vol (A2C):   61.5 ml 34.34 ml/m  RA Volume:   53.70 ml  29.98 ml/m LA Vol (A4C):   60.9 ml 34.00 ml/m LA Biplane Vol: 66.7 ml 37.24 ml/m  AORTIC VALVE AV Area (Vmax):    0.94 cm AV Area (Vmean):   0.95 cm AV Area (VTI):     0.96 cm AV Vmax:           244.50 cm/s AV Vmean:          157.500 cm/s AV VTI:            0.438 m AV Peak Grad:      23.9 mmHg AV Mean Grad:      12.0 mmHg LVOT Vmax:         73.25 cm/s LVOT Vmean:        47.450 cm/s LVOT VTI:          0.134 m LVOT/AV VTI ratio: 0.31 AI PHT:            504 msec  AORTA Ao Root diam: 3.30 cm Ao Asc diam:  3.80 cm TRICUSPID VALVE TR Peak grad:   13.2 mmHg TR Vmax:        182.00 cm/s  SHUNTS Systemic VTI:  0.13 m Systemic Diam: 2.00 cm Glori Bickers MD Electronically signed by Glori Bickers MD Signature Date/Time: 08/16/2022/12:29:56 PM    Final    DG Chest Portable 1 View  Result Date: 08/15/2022 CLINICAL DATA:  Chest pain. Mild shortness of breath. Possible atrial fibrillation reported by Apple watch. EXAM: PORTABLE CHEST 1 VIEW COMPARISON:  06/15/2013 FINDINGS: The lungs appear clear.  Cardiac and mediastinal contours normal. No pleural effusion identified. Mild lower thoracic spondylosis IMPRESSION: 1. No active disease. 2. Mild lower thoracic spondylosis. Electronically Signed   By: Van Clines M.D.   On: 08/15/2022 17:14     I have independently reviewed the above radiologic studies and discussed with the patient   Recent Lab Findings: Lab Results  Component Value Date   WBC 7.7 08/17/2022   HGB 13.7 08/17/2022   HCT 40.2 08/17/2022   PLT 213 08/17/2022   GLUCOSE 109 (H) 08/16/2022   CHOL 159 08/17/2022   TRIG 63 08/17/2022   HDL 61 08/17/2022  LDLDIRECT 179.6 01/04/2013   LDLCALC 85 08/17/2022   ALT 24 08/15/2022   AST 44 (H) 08/15/2022   NA 137 08/16/2022   K 3.8 08/16/2022   CL 106 08/16/2022   CREATININE 0.87 08/16/2022   BUN 13 08/16/2022   CO2 25 08/16/2022   TSH 1.348 08/15/2022   INR 0.97 06/15/2013   HGBA1C 5.4 08/16/2022      Assessment / Plan: Severe three-vessel coronary artery disease in the setting of non-STEMI. New onset atrial fibrillation Likely at least moderate aortic stenosis, will be reevaluated in OR by TEE for consideration of AVR Extracranial cerebrovascular occlusive disease with bilateral carotid bruits-will obtain preoperative vascular studies Will obtain preoperative chest CT-noncontrast History of colon polyps History of prostate cancer status postresection Hyperlipidemia Hypothyroidism Multiple previous back surgeries Status post left knee replacement     Plan: Surgeon will evaluate the patient's studies and determine timing and feasibility of proceeding with CABG/possible AVR    I  spent 40 minutes counseling the patient face to face.   John Giovanni, PA-C  08/17/2022 10:12 AM  DR Tenny Craw ADDENDUM:  Patient seen  Pain in left arm / chest for several weeks. Mostly at rest, at times after elliptical as well. Told was a pinched nerve. Went into AF and apple watch said see an MD  Found to have 3v CAD  along with highly calcified AV with likely moderate stenosis  Reports recent dental visit, no issues. Does a lot of elliptical and in general appears to have good exercise tolerance.   Plan CAB / AV replace / LAA clip, possible MAZE.  Risks/benefits/alternatives were discussed at length (85% straightforward recovery, 12% morbidity [any organ, predominantly age-related issues I.e. tissue quality, coronary issues related to extensive Ca2 including need for possible endarterectomy, stroke, bleeding, return to OR, damage to surrounding structures, renal issues, 4-5% mortality]. Issues related to valve surgery also discussed. Straightforward recovery typically entails 2-3 ICU days, 3-4 days on the floor, seeing me back in clinic at 1 week and 1 month postoperatively. No lifting greater than 15 lbs for 6 weeks. Total recovery expected by 2 months. All questions were asked and answered. Wife and daughter present for full conversation.

## 2022-08-18 ENCOUNTER — Inpatient Hospital Stay (HOSPITAL_COMMUNITY): Payer: PPO

## 2022-08-18 ENCOUNTER — Inpatient Hospital Stay (HOSPITAL_COMMUNITY): Payer: PPO | Admitting: Anesthesiology

## 2022-08-18 ENCOUNTER — Other Ambulatory Visit: Payer: Self-pay

## 2022-08-18 ENCOUNTER — Inpatient Hospital Stay (HOSPITAL_COMMUNITY)
Admission: EM | Disposition: A | Discharge status: Home / Self Care | Payer: Self-pay | Source: Home / Self Care | Attending: Cardiothoracic Surgery

## 2022-08-18 DIAGNOSIS — Z951 Presence of aortocoronary bypass graft: Secondary | ICD-10-CM | POA: Diagnosis not present

## 2022-08-18 DIAGNOSIS — I2510 Atherosclerotic heart disease of native coronary artery without angina pectoris: Secondary | ICD-10-CM

## 2022-08-18 DIAGNOSIS — I252 Old myocardial infarction: Secondary | ICD-10-CM

## 2022-08-18 DIAGNOSIS — I700 Atherosclerosis of aorta: Secondary | ICD-10-CM

## 2022-08-18 DIAGNOSIS — I480 Paroxysmal atrial fibrillation: Secondary | ICD-10-CM

## 2022-08-18 DIAGNOSIS — I350 Nonrheumatic aortic (valve) stenosis: Secondary | ICD-10-CM

## 2022-08-18 DIAGNOSIS — Z9911 Dependence on respirator [ventilator] status: Secondary | ICD-10-CM | POA: Diagnosis not present

## 2022-08-18 DIAGNOSIS — I959 Hypotension, unspecified: Secondary | ICD-10-CM

## 2022-08-18 DIAGNOSIS — Z952 Presence of prosthetic heart valve: Secondary | ICD-10-CM

## 2022-08-18 DIAGNOSIS — I214 Non-ST elevation (NSTEMI) myocardial infarction: Secondary | ICD-10-CM | POA: Diagnosis not present

## 2022-08-18 DIAGNOSIS — Z87891 Personal history of nicotine dependence: Secondary | ICD-10-CM

## 2022-08-18 HISTORY — PX: AORTIC VALVE REPLACEMENT: SHX41

## 2022-08-18 HISTORY — PX: TEE WITHOUT CARDIOVERSION: SHX5443

## 2022-08-18 HISTORY — PX: MAZE: SHX5063

## 2022-08-18 HISTORY — PX: CORONARY ARTERY BYPASS GRAFT: SHX141

## 2022-08-18 HISTORY — PX: CLIPPING OF ATRIAL APPENDAGE: SHX5773

## 2022-08-18 LAB — POCT I-STAT 7, (LYTES, BLD GAS, ICA,H+H)
Acid-base deficit: 6 mmol/L — ABNORMAL HIGH (ref 0.0–2.0)
Acid-base deficit: 1 mmol/L (ref 0.0–2.0)
Acid-base deficit: 1 mmol/L (ref 0.0–2.0)
Acid-base deficit: 2 mmol/L (ref 0.0–2.0)
Acid-base deficit: 2 mmol/L (ref 0.0–2.0)
Acid-base deficit: 6 mmol/L — ABNORMAL HIGH (ref 0.0–2.0)
Acid-base deficit: 5 mmol/L — ABNORMAL HIGH (ref 0.0–2.0)
Bicarbonate: 18.7 mmol/L — ABNORMAL LOW (ref 20.0–28.0)
Bicarbonate: 23.8 mmol/L (ref 20.0–28.0)
Bicarbonate: 24.1 mmol/L (ref 20.0–28.0)
Bicarbonate: 23.7 mmol/L (ref 20.0–28.0)
Bicarbonate: 22.5 mmol/L (ref 20.0–28.0)
Bicarbonate: 20.3 mmol/L (ref 20.0–28.0)
Bicarbonate: 21 mmol/L (ref 20.0–28.0)
Calcium, Ion: 1.19 mmol/L (ref 1.15–1.40)
Calcium, Ion: 1.05 mmol/L — ABNORMAL LOW (ref 1.15–1.40)
Calcium, Ion: 1.06 mmol/L — ABNORMAL LOW (ref 1.15–1.40)
Calcium, Ion: 1.11 mmol/L — ABNORMAL LOW (ref 1.15–1.40)
Calcium, Ion: 1.06 mmol/L — ABNORMAL LOW (ref 1.15–1.40)
Calcium, Ion: 1.12 mmol/L — ABNORMAL LOW (ref 1.15–1.40)
Calcium, Ion: 1.18 mmol/L (ref 1.15–1.40)
HCT: 33 % — ABNORMAL LOW (ref 39.0–52.0)
HCT: 27 % — ABNORMAL LOW (ref 39.0–52.0)
HCT: 28 % — ABNORMAL LOW (ref 39.0–52.0)
HCT: 30 % — ABNORMAL LOW (ref 39.0–52.0)
HCT: 26 % — ABNORMAL LOW (ref 39.0–52.0)
HCT: 26 % — ABNORMAL LOW (ref 39.0–52.0)
HCT: 22 % — ABNORMAL LOW (ref 39.0–52.0)
Hemoglobin: 11.2 g/dL — ABNORMAL LOW (ref 13.0–17.0)
Hemoglobin: 9.2 g/dL — ABNORMAL LOW (ref 13.0–17.0)
Hemoglobin: 9.5 g/dL — ABNORMAL LOW (ref 13.0–17.0)
Hemoglobin: 10.2 g/dL — ABNORMAL LOW (ref 13.0–17.0)
Hemoglobin: 8.8 g/dL — ABNORMAL LOW (ref 13.0–17.0)
Hemoglobin: 8.8 g/dL — ABNORMAL LOW (ref 13.0–17.0)
Hemoglobin: 7.5 g/dL — ABNORMAL LOW (ref 13.0–17.0)
O2 Saturation: 100 %
O2 Saturation: 100 %
O2 Saturation: 89 %
O2 Saturation: 100 %
O2 Saturation: 100 %
O2 Saturation: 99 %
O2 Saturation: 99 %
Patient temperature: 34.9
Patient temperature: 36.6
Potassium: 2.9 mmol/L — ABNORMAL LOW (ref 3.5–5.1)
Potassium: 3.5 mmol/L (ref 3.5–5.1)
Potassium: 3.7 mmol/L (ref 3.5–5.1)
Potassium: 4.4 mmol/L (ref 3.5–5.1)
Potassium: 3.9 mmol/L (ref 3.5–5.1)
Potassium: 3.7 mmol/L (ref 3.5–5.1)
Potassium: 4.3 mmol/L (ref 3.5–5.1)
Sodium: 142 mmol/L (ref 135–145)
Sodium: 141 mmol/L (ref 135–145)
Sodium: 143 mmol/L (ref 135–145)
Sodium: 141 mmol/L (ref 135–145)
Sodium: 142 mmol/L (ref 135–145)
Sodium: 142 mmol/L (ref 135–145)
Sodium: 141 mmol/L (ref 135–145)
TCO2: 20 mmol/L — ABNORMAL LOW (ref 22–32)
TCO2: 25 mmol/L (ref 22–32)
TCO2: 25 mmol/L (ref 22–32)
TCO2: 25 mmol/L (ref 22–32)
TCO2: 24 mmol/L (ref 22–32)
TCO2: 22 mmol/L (ref 22–32)
TCO2: 22 mmol/L (ref 22–32)
pCO2 arterial: 34.9 mmHg (ref 32–48)
pCO2 arterial: 40 mmHg (ref 32–48)
pCO2 arterial: 40.7 mmHg (ref 32–48)
pCO2 arterial: 41.4 mmHg (ref 32–48)
pCO2 arterial: 37.5 mmHg (ref 32–48)
pCO2 arterial: 37.1 mmHg (ref 32–48)
pCO2 arterial: 41.7 mmHg (ref 32–48)
pH, Arterial: 7.337 — ABNORMAL LOW (ref 7.35–7.45)
pH, Arterial: 7.375 (ref 7.35–7.45)
pH, Arterial: 7.388 (ref 7.35–7.45)
pH, Arterial: 7.365 (ref 7.35–7.45)
pH, Arterial: 7.386 (ref 7.35–7.45)
pH, Arterial: 7.336 — ABNORMAL LOW (ref 7.35–7.45)
pH, Arterial: 7.308 — ABNORMAL LOW (ref 7.35–7.45)
pO2, Arterial: 515 mmHg — ABNORMAL HIGH (ref 83–108)
pO2, Arterial: 400 mmHg — ABNORMAL HIGH (ref 83–108)
pO2, Arterial: 59 mmHg — ABNORMAL LOW (ref 83–108)
pO2, Arterial: 290 mmHg — ABNORMAL HIGH (ref 83–108)
pO2, Arterial: 426 mmHg — ABNORMAL HIGH (ref 83–108)
pO2, Arterial: 149 mmHg — ABNORMAL HIGH (ref 83–108)
pO2, Arterial: 171 mmHg — ABNORMAL HIGH (ref 83–108)

## 2022-08-18 LAB — GLUCOSE, CAPILLARY
Glucose-Capillary: 108 mg/dL — ABNORMAL HIGH (ref 70–99)
Glucose-Capillary: 122 mg/dL — ABNORMAL HIGH (ref 70–99)
Glucose-Capillary: 129 mg/dL — ABNORMAL HIGH (ref 70–99)
Glucose-Capillary: 132 mg/dL — ABNORMAL HIGH (ref 70–99)
Glucose-Capillary: 132 mg/dL — ABNORMAL HIGH (ref 70–99)
Glucose-Capillary: 133 mg/dL — ABNORMAL HIGH (ref 70–99)
Glucose-Capillary: 140 mg/dL — ABNORMAL HIGH (ref 70–99)
Glucose-Capillary: 153 mg/dL — ABNORMAL HIGH (ref 70–99)

## 2022-08-18 LAB — POCT I-STAT, CHEM 8
BUN: 11 mg/dL (ref 8–23)
BUN: 12 mg/dL (ref 8–23)
BUN: 10 mg/dL (ref 8–23)
BUN: 10 mg/dL (ref 8–23)
BUN: 9 mg/dL (ref 8–23)
BUN: 11 mg/dL (ref 8–23)
Calcium, Ion: 1.22 mmol/L (ref 1.15–1.40)
Calcium, Ion: 1.27 mmol/L (ref 1.15–1.40)
Calcium, Ion: 1.07 mmol/L — ABNORMAL LOW (ref 1.15–1.40)
Calcium, Ion: 1.12 mmol/L — ABNORMAL LOW (ref 1.15–1.40)
Calcium, Ion: 1.04 mmol/L — ABNORMAL LOW (ref 1.15–1.40)
Calcium, Ion: 1.04 mmol/L — ABNORMAL LOW (ref 1.15–1.40)
Chloride: 107 mmol/L (ref 98–111)
Chloride: 107 mmol/L (ref 98–111)
Chloride: 107 mmol/L (ref 98–111)
Chloride: 107 mmol/L (ref 98–111)
Chloride: 107 mmol/L (ref 98–111)
Chloride: 107 mmol/L (ref 98–111)
Creatinine, Ser: 0.6 mg/dL — ABNORMAL LOW (ref 0.61–1.24)
Creatinine, Ser: 0.7 mg/dL (ref 0.61–1.24)
Creatinine, Ser: 0.6 mg/dL — ABNORMAL LOW (ref 0.61–1.24)
Creatinine, Ser: 0.6 mg/dL — ABNORMAL LOW (ref 0.61–1.24)
Creatinine, Ser: 0.6 mg/dL — ABNORMAL LOW (ref 0.61–1.24)
Creatinine, Ser: 0.6 mg/dL — ABNORMAL LOW (ref 0.61–1.24)
Glucose, Bld: 87 mg/dL (ref 70–99)
Glucose, Bld: 98 mg/dL (ref 70–99)
Glucose, Bld: 118 mg/dL — ABNORMAL HIGH (ref 70–99)
Glucose, Bld: 96 mg/dL (ref 70–99)
Glucose, Bld: 104 mg/dL — ABNORMAL HIGH (ref 70–99)
Glucose, Bld: 123 mg/dL — ABNORMAL HIGH (ref 70–99)
HCT: 33 % — ABNORMAL LOW (ref 39.0–52.0)
HCT: 36 % — ABNORMAL LOW (ref 39.0–52.0)
HCT: 30 % — ABNORMAL LOW (ref 39.0–52.0)
HCT: 31 % — ABNORMAL LOW (ref 39.0–52.0)
HCT: 26 % — ABNORMAL LOW (ref 39.0–52.0)
HCT: 26 % — ABNORMAL LOW (ref 39.0–52.0)
Hemoglobin: 11.2 g/dL — ABNORMAL LOW (ref 13.0–17.0)
Hemoglobin: 12.2 g/dL — ABNORMAL LOW (ref 13.0–17.0)
Hemoglobin: 10.2 g/dL — ABNORMAL LOW (ref 13.0–17.0)
Hemoglobin: 10.5 g/dL — ABNORMAL LOW (ref 13.0–17.0)
Hemoglobin: 8.8 g/dL — ABNORMAL LOW (ref 13.0–17.0)
Hemoglobin: 8.8 g/dL — ABNORMAL LOW (ref 13.0–17.0)
Potassium: 3.1 mmol/L — ABNORMAL LOW (ref 3.5–5.1)
Potassium: 3.6 mmol/L (ref 3.5–5.1)
Potassium: 4.1 mmol/L (ref 3.5–5.1)
Potassium: 4.2 mmol/L (ref 3.5–5.1)
Potassium: 4.2 mmol/L (ref 3.5–5.1)
Potassium: 3.9 mmol/L (ref 3.5–5.1)
Sodium: 142 mmol/L (ref 135–145)
Sodium: 143 mmol/L (ref 135–145)
Sodium: 140 mmol/L (ref 135–145)
Sodium: 142 mmol/L (ref 135–145)
Sodium: 141 mmol/L (ref 135–145)
Sodium: 142 mmol/L (ref 135–145)
TCO2: 26 mmol/L (ref 22–32)
TCO2: 28 mmol/L (ref 22–32)
TCO2: 25 mmol/L (ref 22–32)
TCO2: 25 mmol/L (ref 22–32)
TCO2: 26 mmol/L (ref 22–32)
TCO2: 26 mmol/L (ref 22–32)

## 2022-08-18 LAB — CBC
HCT: 38.6 % — ABNORMAL LOW (ref 39.0–52.0)
HCT: 26.2 % — ABNORMAL LOW (ref 39.0–52.0)
HCT: 24.1 % — ABNORMAL LOW (ref 39.0–52.0)
Hemoglobin: 13.5 g/dL (ref 13.0–17.0)
Hemoglobin: 8.6 g/dL — ABNORMAL LOW (ref 13.0–17.0)
Hemoglobin: 8.2 g/dL — ABNORMAL LOW (ref 13.0–17.0)
MCH: 33.9 pg (ref 26.0–34.0)
MCH: 33.2 pg (ref 26.0–34.0)
MCH: 33.9 pg (ref 26.0–34.0)
MCHC: 35 g/dL (ref 30.0–36.0)
MCHC: 32.8 g/dL (ref 30.0–36.0)
MCHC: 34 g/dL (ref 30.0–36.0)
MCV: 97 fL (ref 80.0–100.0)
MCV: 101.2 fL — ABNORMAL HIGH (ref 80.0–100.0)
MCV: 99.6 fL (ref 80.0–100.0)
Platelets: 182 10*3/uL (ref 150–400)
Platelets: 101 10*3/uL — ABNORMAL LOW (ref 150–400)
Platelets: 117 10*3/uL — ABNORMAL LOW (ref 150–400)
RBC: 3.98 MIL/uL — ABNORMAL LOW (ref 4.22–5.81)
RBC: 2.59 MIL/uL — ABNORMAL LOW (ref 4.22–5.81)
RBC: 2.42 MIL/uL — ABNORMAL LOW (ref 4.22–5.81)
RDW: 14 % (ref 11.5–15.5)
RDW: 14.1 % (ref 11.5–15.5)
RDW: 14 % (ref 11.5–15.5)
WBC: 7 10*3/uL (ref 4.0–10.5)
WBC: 6.3 10*3/uL (ref 4.0–10.5)
WBC: 5.3 10*3/uL (ref 4.0–10.5)
nRBC: 0 % (ref 0.0–0.2)
nRBC: 0 % (ref 0.0–0.2)
nRBC: 0 % (ref 0.0–0.2)

## 2022-08-18 LAB — BASIC METABOLIC PANEL
Anion gap: 10 (ref 5–15)
Anion gap: 6 (ref 5–15)
BUN: 14 mg/dL (ref 8–23)
BUN: 9 mg/dL (ref 8–23)
CO2: 22 mmol/L (ref 22–32)
CO2: 21 mmol/L — ABNORMAL LOW (ref 22–32)
Calcium: 8.8 mg/dL — ABNORMAL LOW (ref 8.9–10.3)
Calcium: 7 mg/dL — ABNORMAL LOW (ref 8.9–10.3)
Chloride: 105 mmol/L (ref 98–111)
Chloride: 112 mmol/L — ABNORMAL HIGH (ref 98–111)
Creatinine, Ser: 0.96 mg/dL (ref 0.61–1.24)
Creatinine, Ser: 0.86 mg/dL (ref 0.61–1.24)
GFR, Estimated: >60 mL/min (ref >60)
GFR, Estimated: >60 mL/min (ref >60)
Glucose, Bld: 95 mg/dL (ref 70–99)
Glucose, Bld: 147 mg/dL — ABNORMAL HIGH (ref 70–99)
Potassium: 3.7 mmol/L (ref 3.5–5.1)
Potassium: 4.3 mmol/L (ref 3.5–5.1)
Sodium: 137 mmol/L (ref 135–145)
Sodium: 139 mmol/L (ref 135–145)

## 2022-08-18 LAB — ECHO INTRAOPERATIVE TEE
Height: 67 in
Weight: 2317.48 oz

## 2022-08-18 LAB — APTT
aPTT: 43 seconds — ABNORMAL HIGH (ref 24–36)
aPTT: 39 seconds — ABNORMAL HIGH (ref 24–36)

## 2022-08-18 LAB — PLATELET COUNT: Platelets: 133 10*3/uL — ABNORMAL LOW (ref 150–400)

## 2022-08-18 LAB — MAGNESIUM: Magnesium: 2.9 mg/dL — ABNORMAL HIGH (ref 1.7–2.4)

## 2022-08-18 LAB — HEPARIN LEVEL (UNFRACTIONATED): Heparin Unfractionated: 0.41 IU/mL (ref 0.30–0.70)

## 2022-08-18 LAB — FIBRINOGEN: Fibrinogen: 156 mg/dL — ABNORMAL LOW (ref 210–475)

## 2022-08-18 LAB — HEMOGLOBIN AND HEMATOCRIT, BLOOD
HCT: 29.5 % — ABNORMAL LOW (ref 39.0–52.0)
Hemoglobin: 9.6 g/dL (ref 13.0–17.0)

## 2022-08-18 LAB — PROTIME-INR
INR: 1.8 — ABNORMAL HIGH (ref 0.8–1.2)
INR: 1.7 — ABNORMAL HIGH (ref 0.8–1.2)
Prothrombin Time: 20.7 seconds — ABNORMAL HIGH (ref 11.4–15.2)
Prothrombin Time: 19.8 seconds — ABNORMAL HIGH (ref 11.4–15.2)

## 2022-08-18 SURGERY — CORONARY ARTERY BYPASS GRAFTING (CABG)
Anesthesia: General | Site: Chest

## 2022-08-18 MED ORDER — SODIUM CHLORIDE 0.45 % IV SOLN: INTRAVENOUS | Status: DC | PRN

## 2022-08-18 MED ORDER — ALBUMIN HUMAN 5 % IV SOLN: INTRAVENOUS | Status: DC | PRN

## 2022-08-18 MED ORDER — VANCOMYCIN HCL IN DEXTROSE 1-5 GM/200ML-% IV SOLN
1000.0000 mg | Freq: Once | INTRAVENOUS | Status: AC
  Administered 2022-08-18: 1000 mg via INTRAVENOUS
  Filled 2022-08-18: qty 200

## 2022-08-18 MED ORDER — ONDANSETRON HCL 4 MG/2ML IJ SOLN: 4.0000 mg | Freq: Four times a day (QID) | INTRAMUSCULAR | Status: DC | PRN

## 2022-08-18 MED ORDER — PROPOFOL 10 MG/ML IV BOLUS
INTRAVENOUS | Status: AC
  Filled 2022-08-18: qty 20

## 2022-08-18 MED ORDER — FENTANYL CITRATE (PF) 250 MCG/5ML IJ SOLN
INTRAMUSCULAR | Status: DC | PRN
  Administered 2022-08-18 (×6): 100 ug via INTRAVENOUS
  Administered 2022-08-18: 150 ug via INTRAVENOUS
  Administered 2022-08-18: 100 ug via INTRAVENOUS
  Administered 2022-08-18: 50 ug via INTRAVENOUS
  Administered 2022-08-18 (×2): 100 ug via INTRAVENOUS
  Administered 2022-08-18: 150 ug via INTRAVENOUS
  Administered 2022-08-18: 250 ug via INTRAVENOUS

## 2022-08-18 MED ORDER — SODIUM CHLORIDE 0.9 % IV SOLN: INTRAVENOUS | Status: DC

## 2022-08-18 MED ORDER — LACTATED RINGERS IV SOLN: INTRAVENOUS | Status: DC

## 2022-08-18 MED ORDER — ORAL CARE MOUTH RINSE: 15.0000 mL | OROMUCOSAL | Status: DC | PRN

## 2022-08-18 MED ORDER — SODIUM CHLORIDE 0.9% FLUSH
3.0000 mL | Freq: Two times a day (BID) | INTRAVENOUS | Status: DC
  Administered 2022-08-19 – 2022-08-24 (×12): 3 mL via INTRAVENOUS

## 2022-08-18 MED ORDER — ROCURONIUM BROMIDE 10 MG/ML (PF) SYRINGE
PREFILLED_SYRINGE | INTRAVENOUS | Status: DC | PRN
  Administered 2022-08-18 (×2): 50 mg via INTRAVENOUS
  Administered 2022-08-18: 20 mg via INTRAVENOUS
  Administered 2022-08-18: 60 mg via INTRAVENOUS
  Administered 2022-08-18: 40 mg via INTRAVENOUS
  Administered 2022-08-18: 80 mg via INTRAVENOUS

## 2022-08-18 MED ORDER — MIDAZOLAM HCL (PF) 10 MG/2ML IJ SOLN
INTRAMUSCULAR | Status: AC
  Filled 2022-08-18: qty 2

## 2022-08-18 MED ORDER — ACETAMINOPHEN 160 MG/5ML PO SOLN
1000.0000 mg | Freq: Four times a day (QID) | ORAL | Status: AC
  Administered 2022-08-19: 1000 mg
  Filled 2022-08-18: qty 40.6

## 2022-08-18 MED ORDER — HEPARIN SODIUM (PORCINE) 1000 UNIT/ML IJ SOLN
INTRAMUSCULAR | Status: AC
  Filled 2022-08-18: qty 1

## 2022-08-18 MED ORDER — FAMOTIDINE IN NACL 20-0.9 MG/50ML-% IV SOLN
INTRAVENOUS | Status: AC
  Filled 2022-08-18: qty 50

## 2022-08-18 MED ORDER — LACTATED RINGERS IV SOLN: INTRAVENOUS | Status: DC | PRN

## 2022-08-18 MED ORDER — PROTAMINE SULFATE 10 MG/ML IV SOLN
INTRAVENOUS | Status: DC | PRN
  Administered 2022-08-18: 230 mg via INTRAVENOUS

## 2022-08-18 MED ORDER — PHENYLEPHRINE 80 MCG/ML (10ML) SYRINGE FOR IV PUSH (FOR BLOOD PRESSURE SUPPORT)
PREFILLED_SYRINGE | INTRAVENOUS | Status: DC | PRN
  Administered 2022-08-18 (×6): 80 ug via INTRAVENOUS

## 2022-08-18 MED ORDER — BUPIVACAINE LIPOSOME 1.3 % IJ SUSP
INTRAMUSCULAR | Status: AC
  Filled 2022-08-18: qty 20

## 2022-08-18 MED ORDER — MUPIROCIN 2 % EX OINT
1.0000 | TOPICAL_OINTMENT | Freq: Two times a day (BID) | CUTANEOUS | Status: DC
  Filled 2022-08-18: qty 22

## 2022-08-18 MED ORDER — ROCURONIUM BROMIDE 10 MG/ML (PF) SYRINGE
PREFILLED_SYRINGE | INTRAVENOUS | Status: AC
  Filled 2022-08-18: qty 10

## 2022-08-18 MED ORDER — SODIUM BICARBONATE 8.4 % IV SOLN
25.0000 meq | Freq: Once | INTRAVENOUS | Status: AC
  Administered 2022-08-18: 25 meq via INTRAVENOUS

## 2022-08-18 MED ORDER — BUPIVACAINE HCL (PF) 0.5 % IJ SOLN
INTRAMUSCULAR | Status: AC
  Filled 2022-08-18: qty 30

## 2022-08-18 MED ORDER — BISACODYL 10 MG RE SUPP: 10.0000 mg | Freq: Every day | RECTAL | Status: DC

## 2022-08-18 MED ORDER — NOREPINEPHRINE 4 MG/250ML-% IV SOLN
0.0000 ug/min | INTRAVENOUS | Status: DC
  Administered 2022-08-19: 5.5 ug/min via INTRAVENOUS
  Administered 2022-08-20: 1 ug/min via INTRAVENOUS
  Administered 2022-08-20: 3 ug/min via INTRAVENOUS
  Administered 2022-08-21: 5 ug/min via INTRAVENOUS
  Administered 2022-08-22: 2 ug/min via INTRAVENOUS
  Filled 2022-08-18 (×5): qty 250

## 2022-08-18 MED ORDER — PLASMA-LYTE A IV SOLN: INTRAVENOUS | Status: DC | PRN

## 2022-08-18 MED ORDER — BUPIVACAINE LIPOSOME 1.3 % IJ SUSP
INTRAMUSCULAR | Status: DC | PRN
  Administered 2022-08-18: 50 mL

## 2022-08-18 MED ORDER — POTASSIUM CHLORIDE 10 MEQ/50ML IV SOLN
10.0000 meq | INTRAVENOUS | Status: AC
  Administered 2022-08-18 (×3): 10 meq via INTRAVENOUS

## 2022-08-18 MED ORDER — ASPIRIN 81 MG PO CHEW
324.0000 mg | CHEWABLE_TABLET | Freq: Every day | ORAL | Status: DC
  Administered 2022-08-26: 324 mg
  Filled 2022-08-18: qty 4

## 2022-08-18 MED ORDER — ACETAMINOPHEN 650 MG RE SUPP
650.0000 mg | Freq: Once | RECTAL | Status: AC
  Administered 2022-08-18: 650 mg via RECTAL

## 2022-08-18 MED ORDER — BISACODYL 5 MG PO TBEC
10.0000 mg | DELAYED_RELEASE_TABLET | Freq: Every day | ORAL | Status: DC
  Administered 2022-08-19 – 2022-08-26 (×6): 10 mg via ORAL
  Filled 2022-08-18 (×6): qty 2

## 2022-08-18 MED ORDER — METOPROLOL TARTRATE 25 MG/10 ML ORAL SUSPENSION: 12.5000 mg | Freq: Two times a day (BID) | ORAL | Status: DC

## 2022-08-18 MED ORDER — OXYCODONE HCL 5 MG PO TABS: 5.0000 mg | ORAL_TABLET | ORAL | Status: DC | PRN

## 2022-08-18 MED ORDER — CHLORHEXIDINE GLUCONATE CLOTH 2 % EX PADS: 6.0000 | MEDICATED_PAD | Freq: Every day | CUTANEOUS | Status: DC

## 2022-08-18 MED ORDER — VANCOMYCIN HCL 1000 MG IV SOLR
INTRAVENOUS | Status: DC | PRN
  Administered 2022-08-18: 1000 mL

## 2022-08-18 MED ORDER — LACTATED RINGERS IV SOLN: 500.0000 mL | Freq: Once | INTRAVENOUS | Status: DC | PRN

## 2022-08-18 MED ORDER — PANTOPRAZOLE SODIUM 40 MG PO TBEC
40.0000 mg | DELAYED_RELEASE_TABLET | Freq: Every day | ORAL | Status: DC
  Administered 2022-08-20 – 2022-08-26 (×7): 40 mg via ORAL
  Filled 2022-08-18 (×7): qty 1

## 2022-08-18 MED ORDER — METOPROLOL TARTRATE 12.5 MG HALF TABLET
12.5000 mg | ORAL_TABLET | Freq: Two times a day (BID) | ORAL | Status: DC
  Administered 2022-08-21 – 2022-08-22 (×2): 12.5 mg via ORAL
  Filled 2022-08-18 (×4): qty 1

## 2022-08-18 MED ORDER — CLONAZEPAM 0.5 MG PO TABS
0.5000 mg | ORAL_TABLET | Freq: Every evening | ORAL | Status: DC | PRN
  Administered 2022-08-19 – 2022-08-25 (×7): 0.5 mg via ORAL
  Filled 2022-08-18 (×7): qty 1

## 2022-08-18 MED ORDER — FENTANYL CITRATE (PF) 250 MCG/5ML IJ SOLN
INTRAMUSCULAR | Status: AC
  Filled 2022-08-18: qty 5

## 2022-08-18 MED ORDER — METOPROLOL TARTRATE 5 MG/5ML IV SOLN: 2.5000 mg | INTRAVENOUS | Status: DC | PRN

## 2022-08-18 MED ORDER — ROSUVASTATIN CALCIUM 20 MG PO TABS
40.0000 mg | ORAL_TABLET | Freq: Every day | ORAL | Status: DC
  Administered 2022-08-19 – 2022-08-26 (×8): 40 mg via ORAL
  Filled 2022-08-18 (×8): qty 2

## 2022-08-18 MED ORDER — SODIUM CHLORIDE 0.9 % IV SOLN: 250.0000 mL | INTRAVENOUS | Status: DC

## 2022-08-18 MED ORDER — PROTAMINE SULFATE 10 MG/ML IV SOLN
INTRAVENOUS | Status: AC
  Filled 2022-08-18: qty 25

## 2022-08-18 MED ORDER — MAGNESIUM SULFATE 4 GM/100ML IV SOLN
INTRAVENOUS | Status: AC
  Filled 2022-08-18: qty 100

## 2022-08-18 MED ORDER — MAGNESIUM SULFATE 4 GM/100ML IV SOLN
4.0000 g | Freq: Once | INTRAVENOUS | Status: AC
  Administered 2022-08-18: 4 g via INTRAVENOUS

## 2022-08-18 MED ORDER — MIDAZOLAM HCL (PF) 5 MG/ML IJ SOLN
INTRAMUSCULAR | Status: DC | PRN
  Administered 2022-08-18: 2 mg via INTRAVENOUS
  Administered 2022-08-18: 1 mg via INTRAVENOUS
  Administered 2022-08-18 (×2): 2 mg via INTRAVENOUS

## 2022-08-18 MED ORDER — SODIUM CHLORIDE 0.9% FLUSH: 3.0000 mL | INTRAVENOUS | Status: DC | PRN

## 2022-08-18 MED ORDER — MILRINONE LACTATE IN DEXTROSE 20-5 MG/100ML-% IV SOLN: 0.3000 ug/kg/min | INTRAVENOUS | Status: DC

## 2022-08-18 MED ORDER — ALBUMIN HUMAN 5 % IV SOLN
12.5000 g | Freq: Once | INTRAVENOUS | Status: AC
  Administered 2022-08-18: 12.5 g via INTRAVENOUS

## 2022-08-18 MED ORDER — INSULIN REGULAR(HUMAN) IN NACL 100-0.9 UT/100ML-% IV SOLN: INTRAVENOUS | Status: DC

## 2022-08-18 MED ORDER — ACETAMINOPHEN 500 MG PO TABS: 1000.0000 mg | ORAL_TABLET | Freq: Once | ORAL | Status: DC

## 2022-08-18 MED ORDER — 0.9 % SODIUM CHLORIDE (POUR BTL) OPTIME
TOPICAL | Status: DC | PRN
  Administered 2022-08-18: 5000 mL

## 2022-08-18 MED ORDER — CEFAZOLIN SODIUM-DEXTROSE 2-4 GM/100ML-% IV SOLN
2.0000 g | Freq: Three times a day (TID) | INTRAVENOUS | Status: AC
  Administered 2022-08-18 – 2022-08-20 (×6): 2 g via INTRAVENOUS
  Filled 2022-08-18 (×5): qty 100

## 2022-08-18 MED ORDER — DOCUSATE SODIUM 100 MG PO CAPS
200.0000 mg | ORAL_CAPSULE | Freq: Every day | ORAL | Status: DC
  Administered 2022-08-19 – 2022-08-26 (×6): 200 mg via ORAL
  Filled 2022-08-18 (×6): qty 2

## 2022-08-18 MED ORDER — SODIUM CHLORIDE 0.9 % IV SOLN: INTRAVENOUS | Status: DC | PRN

## 2022-08-18 MED ORDER — NITROGLYCERIN IN D5W 200-5 MCG/ML-% IV SOLN: 0.0000 ug/min | INTRAVENOUS | Status: DC

## 2022-08-18 MED ORDER — HEPARIN SODIUM (PORCINE) 1000 UNIT/ML IJ SOLN
INTRAMUSCULAR | Status: DC | PRN
  Administered 2022-08-18: 18000 [IU] via INTRAVENOUS
  Administered 2022-08-18 (×2): 5000 [IU] via INTRAVENOUS

## 2022-08-18 MED ORDER — HEMOSTATIC AGENTS (NO CHARGE) OPTIME
TOPICAL | Status: DC | PRN
  Administered 2022-08-18 (×6): 1 via TOPICAL

## 2022-08-18 MED ORDER — ALBUMIN HUMAN 5 % IV SOLN
250.0000 mL | INTRAVENOUS | Status: AC | PRN
  Administered 2022-08-18 (×3): 12.5 g via INTRAVENOUS
  Filled 2022-08-18 (×2): qty 250

## 2022-08-18 MED ORDER — PROPOFOL 10 MG/ML IV BOLUS
INTRAVENOUS | Status: DC | PRN
  Administered 2022-08-18: 50 mg via INTRAVENOUS
  Administered 2022-08-18: 80 mg via INTRAVENOUS
  Administered 2022-08-18: 20 mg via INTRAVENOUS

## 2022-08-18 MED ORDER — MIDAZOLAM HCL 2 MG/2ML IJ SOLN
2.0000 mg | INTRAMUSCULAR | Status: DC | PRN
  Administered 2022-08-19: 2 mg via INTRAVENOUS
  Filled 2022-08-18: qty 2

## 2022-08-18 MED ORDER — TRAMADOL HCL 50 MG PO TABS
50.0000 mg | ORAL_TABLET | ORAL | Status: DC | PRN
  Administered 2022-08-19 – 2022-08-21 (×5): 100 mg via ORAL
  Filled 2022-08-18 (×5): qty 2

## 2022-08-18 MED ORDER — LEVOTHYROXINE SODIUM 50 MCG PO TABS
50.0000 ug | ORAL_TABLET | Freq: Every day | ORAL | Status: DC
  Administered 2022-08-19 – 2022-08-26 (×8): 50 ug via ORAL
  Filled 2022-08-18 (×8): qty 1

## 2022-08-18 MED ORDER — MORPHINE SULFATE (PF) 2 MG/ML IV SOLN
1.0000 mg | INTRAVENOUS | Status: DC | PRN
  Administered 2022-08-18: 1 mg via INTRAVENOUS
  Administered 2022-08-19: 2 mg via INTRAVENOUS
  Filled 2022-08-18 (×2): qty 1

## 2022-08-18 MED ORDER — CHLORHEXIDINE GLUCONATE CLOTH 2 % EX PADS
6.0000 | MEDICATED_PAD | Freq: Every day | CUTANEOUS | Status: DC
  Administered 2022-08-19 – 2022-08-26 (×9): 6 via TOPICAL

## 2022-08-18 MED ORDER — FAMOTIDINE IN NACL 20-0.9 MG/50ML-% IV SOLN
20.0000 mg | Freq: Two times a day (BID) | INTRAVENOUS | Status: AC
  Administered 2022-08-18 (×2): 20 mg via INTRAVENOUS
  Filled 2022-08-18: qty 50

## 2022-08-18 MED ORDER — ASPIRIN 325 MG PO TBEC
325.0000 mg | DELAYED_RELEASE_TABLET | Freq: Every day | ORAL | Status: DC
  Administered 2022-08-19 – 2022-08-25 (×7): 325 mg via ORAL
  Filled 2022-08-18 (×8): qty 1

## 2022-08-18 MED ORDER — ACETAMINOPHEN 160 MG/5ML PO SOLN: 650.0000 mg | Freq: Once | ORAL | Status: AC

## 2022-08-18 MED ORDER — EPINEPHRINE HCL 5 MG/250ML IV SOLN IN NS: 0.0000 ug/min | INTRAVENOUS | Status: DC

## 2022-08-18 MED ORDER — ORAL CARE MOUTH RINSE
15.0000 mL | OROMUCOSAL | Status: DC
  Administered 2022-08-18 – 2022-08-19 (×5): 15 mL via OROMUCOSAL

## 2022-08-18 MED ORDER — CHLORHEXIDINE GLUCONATE 0.12 % MT SOLN: 15.0000 mL | OROMUCOSAL | Status: AC

## 2022-08-18 MED ORDER — DEXTROSE 50 % IV SOLN: 0.0000 mL | INTRAVENOUS | Status: DC | PRN

## 2022-08-18 MED ORDER — DEXMEDETOMIDINE HCL IN NACL 400 MCG/100ML IV SOLN
0.0000 ug/kg/h | INTRAVENOUS | Status: DC
  Administered 2022-08-18: 0.7 ug/kg/h via INTRAVENOUS
  Filled 2022-08-18 (×2): qty 100

## 2022-08-18 MED ORDER — ACETAMINOPHEN 500 MG PO TABS
1000.0000 mg | ORAL_TABLET | Freq: Four times a day (QID) | ORAL | Status: AC
  Administered 2022-08-19 – 2022-08-23 (×17): 1000 mg via ORAL
  Filled 2022-08-18 (×16): qty 2

## 2022-08-18 MED ORDER — HEPARIN SODIUM (PORCINE) 1000 UNIT/ML IJ SOLN
INTRAMUSCULAR | Status: AC
  Filled 2022-08-18: qty 10

## 2022-08-18 SURGICAL SUPPLY — 147 items
ADAPTER CARDIO PERF ANTE/RETRO (ADAPTER) ×3 IMPLANT
ADAPTER MULTI PERFUSION 15 (ADAPTER) ×3 IMPLANT
ADH SKN CLS APL DERMABOND .7 (GAUZE/BANDAGES/DRESSINGS) ×4
ADPR CRDPLG 7.5 .25D 1 LRG Y (ADAPTER)
ADPR PRFSN 84XANTGRD RTRGD (ADAPTER) ×2
ADPR TBG 2 MALE LL ART (MISCELLANEOUS) ×2
APPLIER CLIP 11 MED OPEN (CLIP) ×2
APPLIER CLIP 9.375 SM OPEN (CLIP) ×6
APR CLP MED 11 20 MLT OPN (CLIP) ×2
APR CLP SM 9.3 20 MLT OPN (CLIP) ×6
ATRICLIP EXCLUSION VLAA 45 (Miscellaneous) ×2 IMPLANT
BAG DECANTER FOR FLEXI CONT (MISCELLANEOUS) ×3 IMPLANT
BLADE CLIPPER SURG (BLADE) ×3 IMPLANT
BLADE MINI 60D BLUE (BLADE) ×3 IMPLANT
BLADE STERNUM SYSTEM 6 (BLADE) ×3 IMPLANT
BLADE SURG 15 STRL LF DISP TIS (BLADE) ×3 IMPLANT
BLADE SURG 15 STRL SS (BLADE) ×2
BLOWER MISTER CAL-MED (MISCELLANEOUS) ×3 IMPLANT
BNDG CMPR 5X62 HK CLSR LF (GAUZE/BANDAGES/DRESSINGS) ×6
BNDG ELASTIC 4X5.8 VLCR STR LF (GAUZE/BANDAGES/DRESSINGS) ×3 IMPLANT
BNDG ELASTIC 6INX 5YD STR LF (GAUZE/BANDAGES/DRESSINGS) IMPLANT
BNDG ELASTIC 6X5.8 VLCR STR LF (GAUZE/BANDAGES/DRESSINGS) ×3 IMPLANT
BNDG GAUZE DERMACEA FLUFF 4 (GAUZE/BANDAGES/DRESSINGS) IMPLANT
BNDG GZE DERMACEA 4 6PLY (GAUZE/BANDAGES/DRESSINGS) ×4
BULB SUCTION JP 400 (SUCTIONS) ×3 IMPLANT
CANISTER SUCT 3000ML PPV (MISCELLANEOUS) ×3 IMPLANT
CANNULA AORTIC ROOT 9FR (CANNULA) ×3 IMPLANT
CANNULA DLP CORONARY OST 14FR (MISCELLANEOUS) IMPLANT
CANNULA GUNDRY RCSP 15FR (MISCELLANEOUS) ×3 IMPLANT
CANNULA MC2 2 STG 29/37 NON-V (CANNULA) ×3 IMPLANT
CANNULA VESSEL 3MM 2 BLNT TIP (CANNULA) ×3 IMPLANT
CATH HEART VENT LEFT (CATHETERS) ×3 IMPLANT
CATH ROBINSON RED A/P 18FR (CATHETERS) ×9 IMPLANT
CATH THORACIC 36FR (CATHETERS) ×3 IMPLANT
CATH THORACIC 36FR RT ANG (CATHETERS) ×3 IMPLANT
CLAMP ISOLATOR SYNERGY LG (MISCELLANEOUS) IMPLANT
CLIP APPLIE 11 MED OPEN (CLIP) ×3 IMPLANT
CLIP APPLIE 9.375 SM OPEN (CLIP) ×6 IMPLANT
CLIP TI MEDIUM 24 (CLIP) IMPLANT
CLIP TI WIDE RED SMALL 24 (CLIP) IMPLANT
CNTNR URN SCR LID CUP LEK RST (MISCELLANEOUS) ×3 IMPLANT
CONNECTOR BLAKE 2:1 CARIO BLK (MISCELLANEOUS) IMPLANT
CONT SPEC 4OZ STRL OR WHT (MISCELLANEOUS) ×4
CONTAINER PROTECT SURGISLUSH (MISCELLANEOUS) ×6 IMPLANT
COUNTER NDL 20CT MAGNET RED (NEEDLE) IMPLANT
COVER SURGICAL LIGHT HANDLE (MISCELLANEOUS) ×3 IMPLANT
DERMABOND ADVANCED .7 DNX12 (GAUZE/BANDAGES/DRESSINGS) ×6 IMPLANT
DEVICE EXCLUSIN ATRCLP VLAA 45 (Miscellaneous) IMPLANT
DEVICE SUT CK QUICK LOAD MINI (Prosthesis & Implant Heart) IMPLANT
DRAIN CHANNEL 24FR (DRAIN) ×6 IMPLANT
DRAIN JACKSON PRATT 10MM FLAT (MISCELLANEOUS) ×3 IMPLANT
DRAPE CARDIOVASCULAR INCISE (DRAPES) ×2
DRAPE SRG 135X102X78XABS (DRAPES) ×3 IMPLANT
DRAPE WARM FLUID 44X44 (DRAPES) ×3 IMPLANT
DRSG AQUACEL AG ADV 3.5X14 (GAUZE/BANDAGES/DRESSINGS) ×3 IMPLANT
DRSG COVADERM 4X14 (GAUZE/BANDAGES/DRESSINGS) ×3 IMPLANT
ELECT CAUTERY BLADE 6.4 (BLADE) ×3 IMPLANT
ELECT REM PT RETURN 9FT ADLT (ELECTROSURGICAL) ×4
ELECTRODE REM PT RTRN 9FT ADLT (ELECTROSURGICAL) ×6 IMPLANT
FELT TEFLON 1X6 (MISCELLANEOUS) ×6 IMPLANT
GAUZE 4X4 16PLY ~~LOC~~+RFID DBL (SPONGE) ×3 IMPLANT
GAUZE SPONGE 4X4 12PLY STRL (GAUZE/BANDAGES/DRESSINGS) ×6 IMPLANT
GLOVE BIO SURGEON STRL SZ 6 (GLOVE) IMPLANT
GLOVE BIO SURGEON STRL SZ 6.5 (GLOVE) IMPLANT
GLOVE BIO SURGEON STRL SZ7 (GLOVE) IMPLANT
GLOVE BIO SURGEON STRL SZ7.5 (GLOVE) IMPLANT
GLOVE BIO SURGEON STRL SZ8 (GLOVE) IMPLANT
GLOVE BIOGEL M 8.0 STRL (GLOVE) ×9 IMPLANT
GLOVE BIOGEL PI IND STRL 6.5 (GLOVE) IMPLANT
GLOVE BIOGEL PI IND STRL 7.0 (GLOVE) IMPLANT
GLOVE BIOGEL PI IND STRL 9 (GLOVE) IMPLANT
GLOVE SURG MICRO LTX SZ7 (GLOVE) ×6 IMPLANT
GOWN STRL REUS W/ TWL LRG LVL3 (GOWN DISPOSABLE) ×12 IMPLANT
GOWN STRL REUS W/ TWL XL LVL3 (GOWN DISPOSABLE) ×9 IMPLANT
GOWN STRL REUS W/TWL LRG LVL3 (GOWN DISPOSABLE) ×8
GOWN STRL REUS W/TWL XL LVL3 (GOWN DISPOSABLE) ×6
HEMOSTAT POWDER SURGIFOAM 1G (HEMOSTASIS) ×9 IMPLANT
HEMOSTAT SURGICEL 2X14 (HEMOSTASIS) ×3 IMPLANT
HEMOSTAT SURGICEL NU-KNIT 6X9 (HEMOSTASIS) ×3 IMPLANT
INSERT FOGARTY XLG (MISCELLANEOUS) ×3 IMPLANT
IV ADAPTER SYR DOUBLE MALE LL (MISCELLANEOUS) IMPLANT
KIT BASIN OR (CUSTOM PROCEDURE TRAY) ×3 IMPLANT
KIT CATH CPB BARTLE (MISCELLANEOUS) ×3 IMPLANT
KIT SUCTION CATH 14FR (SUCTIONS) ×3 IMPLANT
KIT SUT CK MINI COMBO 4X17 (Prosthesis & Implant Heart) IMPLANT
KIT TURNOVER KIT B (KITS) ×3 IMPLANT
KIT VASOVIEW HEMOPRO 2 VH 4000 (KITS) ×3 IMPLANT
KNIFE MICRO-UNI 3.5 30 DEG (BLADE) ×3 IMPLANT
LEAD PACING MYOCARDI (MISCELLANEOUS) ×3 IMPLANT
LINE VENT (MISCELLANEOUS) IMPLANT
NS IRRIG 1000ML POUR BTL (IV SOLUTION) ×18 IMPLANT
OFFPUMP STABILIZER SUV (MISCELLANEOUS) ×3 IMPLANT
ORGANIZER SUTURE GABBAY-FRATER (MISCELLANEOUS) ×3 IMPLANT
PACK E OPEN HEART (SUTURE) ×3 IMPLANT
PACK OPEN HEART (CUSTOM PROCEDURE TRAY) ×3 IMPLANT
PAD ARMBOARD 7.5X6 YLW CONV (MISCELLANEOUS) ×6 IMPLANT
PAD ELECT DEFIB RADIOL ZOLL (MISCELLANEOUS) ×3 IMPLANT
POSITIONER HEAD DONUT 9IN (MISCELLANEOUS) ×3 IMPLANT
POWDER SURGICEL 3.0 GRAM (HEMOSTASIS) IMPLANT
PROBE INTRAVASCULAR 45 (VASCULAR PRODUCTS) ×3 IMPLANT
PUNCH AORTIC ROTATE 4.0MM (MISCELLANEOUS) IMPLANT
SEALANT PATCH FIBRIN 2X4IN (MISCELLANEOUS) IMPLANT
SET MPS 3-ND DEL (MISCELLANEOUS) IMPLANT
SET TUBE SMOKE EVAC HIGH FLOW (TUBING) IMPLANT
SET Y-VENT ADPTR 7.5 MALE LUER (ADAPTER) IMPLANT
SPONGE T-LAP 18X18 ~~LOC~~+RFID (SPONGE) ×12 IMPLANT
SPONGE T-LAP 4X18 ~~LOC~~+RFID (SPONGE) ×3 IMPLANT
STOPCOCK 4 WAY LG BORE MALE ST (IV SETS) IMPLANT
SUT BONE WAX W31G (SUTURE) ×3 IMPLANT
SUT EB EXC GRN/WHT 2-0 V-5 (SUTURE) ×6 IMPLANT
SUT ETHIBOND 2 0 SH (SUTURE) ×2
SUT ETHIBOND 2 0 SH 36X2 (SUTURE) IMPLANT
SUT MNCRL AB 3-0 PS2 27 (SUTURE) ×6 IMPLANT
SUT MNCRL AB 4-0 PS2 18 (SUTURE) IMPLANT
SUT PERMA SILK 0 CT1 (SUTURE) ×6 IMPLANT
SUT PROLENE 3 0 SH DA (SUTURE) IMPLANT
SUT PROLENE 3 0 SH1 36 (SUTURE) ×3 IMPLANT
SUT PROLENE 4 0 RB 1 (SUTURE) ×10
SUT PROLENE 4 0 SH DA (SUTURE) ×6 IMPLANT
SUT PROLENE 4-0 RB1 .5 CRCL 36 (SUTURE) ×9 IMPLANT
SUT PROLENE 5 0 C 1 36 (SUTURE) IMPLANT
SUT PROLENE 6 0 C 1 30 (SUTURE) IMPLANT
SUT PROLENE 8 0 BV175 6 (SUTURE) IMPLANT
SUT SILK  1 MH (SUTURE) ×12
SUT SILK 1 MH (SUTURE) ×18 IMPLANT
SUT SILK 2 0 SH (SUTURE) IMPLANT
SUT STEEL 6MS V (SUTURE) IMPLANT
SUT STEEL STERNAL CCS#1 18IN (SUTURE) ×6 IMPLANT
SUT VIC AB 1 CTX 36 (SUTURE) ×4
SUT VIC AB 1 CTX36XBRD ANBCTR (SUTURE) ×6 IMPLANT
SUT VIC AB 2-0 CT1 27 (SUTURE) ×2
SUT VIC AB 2-0 CT1 TAPERPNT 27 (SUTURE) IMPLANT
SUT VIC AB 2-0 CTX 27 (SUTURE) ×6 IMPLANT
SYSTEM SAHARA CHEST DRAIN ATS (WOUND CARE) ×6 IMPLANT
TAPE CLOTH SURG 4X10 WHT LF (GAUZE/BANDAGES/DRESSINGS) IMPLANT
TAPE PAPER 2X10 WHT MICROPORE (GAUZE/BANDAGES/DRESSINGS) IMPLANT
TAPES RETRACTO (MISCELLANEOUS) IMPLANT
TOWEL GREEN STERILE (TOWEL DISPOSABLE) ×3 IMPLANT
TOWEL GREEN STERILE FF (TOWEL DISPOSABLE) ×3 IMPLANT
TRAY FOLEY SLVR 16FR TEMP STAT (SET/KITS/TRAYS/PACK) ×3 IMPLANT
TUBE SUCT INTRACARD DLP 20F (MISCELLANEOUS) ×3 IMPLANT
TUBING ART PRESS 48 MALE/FEM (TUBING) ×6 IMPLANT
TUBING LAP HI FLOW INSUFFLATIO (TUBING) ×3 IMPLANT
UNDERPAD 30X36 HEAVY ABSORB (UNDERPADS AND DIAPERS) ×3 IMPLANT
VALVE AORTIC SZ23 INSP/RESIL (Prosthesis & Implant Heart) IMPLANT
VENT LEFT HEART 12002 (CATHETERS) ×2
WATER STERILE IRR 1000ML POUR (IV SOLUTION) ×6 IMPLANT

## 2022-08-18 NOTE — Anesthesia Procedure Notes (Signed)
Arterial Line Insertion Performed by: Keyshawn Hellwig B, CRNA, CRNA  Patient location: Pre-op. Preanesthetic checklist: patient identified, IV checked, site marked, risks and benefits discussed, surgical consent, monitors and equipment checked, pre-op evaluation, timeout performed and anesthesia consent Lidocaine 1% used for infiltration and patient sedated Left, radial was placed Catheter size: 20 G Hand hygiene performed , maximum sterile barriers used  and Seldinger technique used Allen's test indicative of satisfactory collateral circulation Attempts: 2 Procedure performed without using ultrasound guided technique. Following insertion, dressing applied and Biopatch. Post procedure assessment: normal  Patient tolerated the procedure well with no immediate complications.     

## 2022-08-18 NOTE — Brief Op Note (Signed)
08/15/2022 - 08/18/2022  12:42 PM  PATIENT:  Jeremy Waters  77 y.o. male  PRE-OPERATIVE DIAGNOSIS: Coronary artery disease, acute coronary syndrome new onset atrial fibrillation, severe aortic valve sclerosis with moderate stenosis  POST-OPERATIVE DIAGNOSIS:  Coronary artery disease, acute coronary syndrome new onset atrial fibrillation, severe aortic valve sclerosis with moderate stenosis  PROCEDURES:    MEDIAN STERNOTOMY  CORONARY ARTERY BYPASS GRAFTING (CABG) X 2 USING LEFT INTERNAL MAMMARY ARTERY AND RIGHT GREATER SAPHENOUS VEIN HARVESTED ENDOSCOPICALLY  LIMA-LAD SVG-RCA  Vein harvest time: 21min Vein prep time: 60min  AORTIC VALVE REPLACEMENT USING EDWARDS INSPIRIS AORTIC VALVE SIZE 23MM   MAZE PROCEDURE   CLIPPING OF ATRIAL APPENDAGE USING ATRICURE 45 ATRICLIP   TRANSESOPHAGEAL ECHOCARDIOGRAM    SURGEON:  Enter, Pierre Bali, MD - Primary  PHYSICIAN ASSISTANT: Samora Jernberg  ASSISTANTS: Wyline Mood, RN, Circulator  Buzzy Han, RN, RN First Assistant  Melody Comas, RN, Scrub Person   ANESTHESIA:   general  EBL:  273ml  BLOOD ADMINISTERED:none  DRAINS: Left pleural and mediastinal drains      LOCAL MEDICATIONS USED:  parasternal Exparel  COUNTS:  Correct  DICTATION: .Dragon Dictation  PLAN OF CARE: Admit to inpatient   PATIENT DISPOSITION:  ICU - intubated and hemodynamically stable.   Delay start of Pharmacological VTE agent (>24hrs) due to surgical blood loss or risk of bleeding: yes

## 2022-08-18 NOTE — Consult Note (Signed)
NAME:  REHAN HINDMON, MRN:  [id number], DOB:  [birth year], LOS: 3 ADMISSION DATE:  08/15/2022 CONSULTATION DATE:  08/18/2022 REFERRING MD:  Tenny Craw - TCTS CHIEF COMPLAINT:  S/p CABG, AVR, MAZE  History of Present Illness:  Mr. Falkowitz is seen in consultation at the request of Dr. Tenny Craw (TCTS) for management of mechanical ventilation and hemodynamics post-CABG/AVR/MAZE.  77 year old man who presented to [facility] 3/24 after his Apple Watch alerted him of possible AFib. Reported several weeks of intermittent L arm/shoulder pain lasting ~1 hour. PMHx significant for CAD, mild AS, PVCs (followed by Cardiology - Dr. Harl Bowie), HLD, hypothyroidism, prostate CA, former tobacco use.   In ER, noted to be in Afib with RVR (rates up to 130s). BNP and troponins were elevated with concern for NSTEMI. ASA/heparin gtt were started and Cardiology consulted. Echo 3/25 demonstrated EF 50-55%, low normal LV function, mildly reduced RV function with dilated LA/RA and findings concerning for low-flow low-gradient AS. LHC was completed 3/25 demonstrating 3-vessel CAD (RCA, LAD, LCx). TCTS consult was recommended and patient subsequently underwent AVR, CABG x 2 (LIMA-LAD, SVG-RCA) and MAZE procedure/Atriclip with Dr. Tenny Craw 3/27.  Pertinent Medical History:   Past Medical History:  Diagnosis Date   Adenomatous colon polyp 09/2007   Arthritis    OA / PAIN LEFT KNEE   Cancer ([place])    prostate cancer   Cataract    removed both eyes   COVID-19 virus infection    Diverticulosis of colon    w/o hemorrage   Heart murmur    TOLD HE HAS A SLIGHT MURMUR   Hyperlipidemia    Inguinal hernia    left side    Thyroid disease    Significant Hospital Events: Including procedures, antibiotic start and stop dates in addition to other pertinent events   3/24 - Admit for Afib/RVR and NSTEMI. 3/25 - Echo EF 50-55%, low normal LV function, mildly reduced RV function with dilated LA/RA and findings concerning for low-flow low-gradient  AS., LHC with 3-vessel disease. TCTS consulted. 3/27 - POD#0 AVR, CABG x 2, MAZE, AtriClip (Enter). CCM consulted for MV/hemodynamics.  Interim History / Subjective:  PCCM consulted for vent management post-CABG.  Objective:  Blood pressure (!) 145/72, pulse 80, temperature 97.9 F (36.6 C), temperature source Oral, resp. rate 16, height 5\' 7"  (1.702 m), weight 65.7 kg, SpO2 99 %.    Vent Mode: SIMV;PSV;PRVC FiO2 (%):  [50 %] 50 % Set Rate:  [16 bmp] 16 bmp Vt Set:  [520 mL] 520 mL PEEP:  [5 cmH20] 5 cmH20 Pressure Support:  [10 cmH20] 10 cmH20 Plateau Pressure:  [14 cmH20] 14 cmH20   Intake/Output Summary (Last 24 hours) at 08/18/2022 1502 Last data filed at 08/18/2022 1449 Gross per 24 hour  Intake 4120.12 ml  Output 3766 ml  Net 354.12 ml   Filed Weights   08/15/22 1619 08/15/22 1950 08/16/22 1135  Weight: 65.8 kg 68.2 kg 65.7 kg   Physical Examination: General: critically ill appearing man lying in bed in NAD HEENT: [place]/AT, eyes anicteric Neuro: RASS -5 CV: S1S2, paced rhythm, bradycardic native rhythm. Chest tubes with small amount of bloody drainage PULM: synchronous with MV, CTAB GI: soft, NT Extremities: bilateral legs bandaged, no cyanosis or edema Skin: warm, dry, no rashes  ABG    Component Value Date/Time   PHART 7.386 08/18/2022 1305   PCO2ART 37.5 08/18/2022 1305   PO2ART 426 (H) 08/18/2022 1305   HCO3 22.5 08/18/2022 1305   TCO2 24 08/18/2022 1305  ACIDBASEDEF 2.0 08/18/2022 1305   O2SAT 100 08/18/2022 1305   H/H 8.6/26.2   Resolved Hospital Problem List:    Assessment & Plan:  Mr. Fugitt is seen in consultation at the request of Dr. Tenny Craw (TCTS) for management of mechanical ventilation and hemodynamics post-CABG/AVR/MAZE.  77 year old man who presented to [facility] 3/24 for ?Afib and L arm/shoulder pain; found to have Afib with RVR, NSTEMI and 3-vessel CAD. Underwent AVR, CABG x 2, MAZE and AtriClip with Dr. Tenny Craw 3/27.   Acute postoperative  ventilator-dependent respiratory failure - Rapid wean protocol - Wean FiO2 for O2 sat > 90% - Daily WUA/SBT, rapid wean with SIMV per protocol - VAP bundle - Pulmonary hygiene - F/u ABG - PAD protocol for sedation: Precedex and Versed for goal RASS 0 to -1  S/p AVR 3/27 NSTEMI, CAD, S/p 2-vessel CABG, LIMA-LAD and SVG-RCA 3/27 - Monitor CI, CO with swan - NE, NTG infusions- labile Bps, had required frequent titrations -- Insulin gtt, CBGs per protocol - Continue ASA/statin -start metoprolol post-op -start plavix after chest tubes removed - Postoperative care per TCTS - CT management per protocol - Surgical ppx (Ancef, Vanc) - Postoperative pain management (APAP, morphine/oxycodone PRN)  Atrial fibrillation S/p MAZE 3/27 and AtriClip Afib with RVR New Afib with RVR noted on ED presentation 3/24. CHA2DS2-VASc score 3 on admission. - Optimize electrolyes (K > 4, Mg > 2) - Cardiac monitoring - AC post- op when bleeding controlled  Hypocalcemia Hypokalemia - Trend BMP, iCal on ABG, replete as indicated  Hypothyroidism - Continue home levothyroxine  Best Practice: (right click and "Reselect all SmartList Selections" daily)   Per Primary Team (TCTS)  Labs:  CBC: Recent Labs  Lab 08/15/22 1631 08/16/22 0219 08/17/22 0605 08/18/22 0206 08/18/22 0808 08/18/22 1122 08/18/22 1135 08/18/22 1214 08/18/22 1302 08/18/22 1305  WBC 8.5 7.8 7.7 7.0  --   --   --   --   --   --   NEUTROABS 5.4  --   --   --   --   --   --   --   --   --   HGB 14.6 14.7 13.7 13.5   < > 10.5* 9.6 8.8* 8.8* 8.8*  HCT 42.8 42.2 40.2 38.6*   < > 31.0* 29.5* 26.0* 26.0* 26.0*  MCV 96.4 96.1 97.6 97.0  --   --   --   --   --   --   PLT 262 239 213 182  --   --  133*  --   --   --    < > = values in this interval not displayed.   Basic Metabolic Panel: Recent Labs  Lab 08/15/22 1631 08/16/22 0219 08/18/22 0206 08/18/22 0808 08/18/22 0946 08/18/22 1004 08/18/22 1032 08/18/22 1057  08/18/22 1122 08/18/22 1214 08/18/22 1302 08/18/22 1305  NA 139 137 137   < > 143   < > 140 141 142 141 142 142  K 3.9 3.8 3.7   < > 3.6   < > 4.1 4.4 4.2 4.2 3.9 3.9  CL 105 106 105   < > 107  --  107  --  107 107 107  --   CO2 25 25 22   --   --   --   --   --   --   --   --   --   GLUCOSE 106* 109* 95   < > 98  --  96  --  118*  104* 123*  --   BUN 14 13 14    < > 12  --  10  --  10 9 11   --   CREATININE 0.89 0.87 0.96   < > 0.70  --  0.60*  --  0.60* 0.60* 0.60*  --   CALCIUM 10.3 8.9 8.8*  --   --   --   --   --   --   --   --   --   MG 2.2  --   --   --   --   --   --   --   --   --   --   --    < > = values in this interval not displayed.   GFR: Estimated Creatinine Clearance: 74.1 mL/min (A) (by C-G formula based on SCr of 0.6 mg/dL (L)). Recent Labs  Lab 08/15/22 1631 08/16/22 0219 08/17/22 0605 08/18/22 0206  WBC 8.5 7.8 7.7 7.0   Liver Function Tests: Recent Labs  Lab 08/15/22 1631  AST 44*  ALT 24  ALKPHOS 38  BILITOT 0.5  PROT 7.2  ALBUMIN 4.5   No results for input(s): "LIPASE", "AMYLASE" in the last 168 hours. No results for input(s): "AMMONIA" in the last 168 hours.  ABG:    Component Value Date/Time   PHART 7.386 08/18/2022 1305   PCO2ART 37.5 08/18/2022 1305   PO2ART 426 (H) 08/18/2022 1305   HCO3 22.5 08/18/2022 1305   TCO2 24 08/18/2022 1305   ACIDBASEDEF 2.0 08/18/2022 1305   O2SAT 100 08/18/2022 1305   Coagulation Profile: Recent Labs  Lab 08/18/22 1347  INR 1.8*   Cardiac Enzymes: No results for input(s): "CKTOTAL", "CKMB", "CKMBINDEX", "TROPONINI" in the last 168 hours.  HbA1C: Hgb A1c MFr Bld  Date/Time Value Ref Range Status  08/16/2022 02:19 AM 5.4 4.8 - 5.6 % Final    Comment:    (NOTE)         Prediabetes: 5.7 - 6.4         Diabetes: >6.4         Glycemic control for adults with diabetes: <7.0   05/04/2022 07:57 AM 5.7 4.6 - 6.5 % Final    Comment:    Glycemic Control Guidelines for People with Diabetes:Non Diabetic:   <6%Goal of Therapy: <7%Additional Action Suggested:  >8%    CBG: No results for input(s): "GLUCAP" in the last 168 hours.  Review of Systems:   Patient is intubated; therefore, history has been obtained from chart review.   Past Medical History:  He,  has a past medical history of Adenomatous colon polyp (09/2007), Arthritis, Cancer ([place]), Cataract, COVID-19 virus infection, Diverticulosis of colon, Heart murmur, Hyperlipidemia, Inguinal hernia, and Thyroid disease.   Surgical History:   Past Surgical History:  Procedure Laterality Date   BACK SURGERY  05/12/2005   L5   COLONOSCOPY  1999   Negative; Dr Fuller Plan   COLONOSCOPY  2004   tics, hemorrhoids   COLONOSCOPY  2009   polyps ([place]   [place]  10/2003   KNEE SURGERY  1969   Patellar fracture fragment Lt   LAMINECTOMY  2000   L4-5   LEFT HEART CATH AND CORONARY ANGIOGRAPHY N/A 08/16/2022   Procedure: LEFT HEART CATH AND CORONARY ANGIOGRAPHY;  Surgeon: [name], MD;  Location: [facility];  Service: Cardiovascular;  Laterality: N/A;   LUMBAR LAMINECTOMY/DECOMPRESSION MICRODISCECTOMY Right 10/08/2015   Procedure: MICRO  LUMBAR DECOMPRESSION L4-L5 AND L5-S1 ON RIGHT ;  Surgeon: Susa Day, MD;  Location: [facility];  Service: Orthopedics;  Laterality: Right;   MASS EXCISION  04/26/2011   Procedure: MINOR EXCISION OF MASS;  Surgeon: Earnstine Regal, MD;  Location: [place];  Service: General;  Laterality: Right;  Excise subcutaneous mass right axilla Minor Room   POLYPECTOMY     PROSTATECTOMY  04/16/2005   TONSILLECTOMY     TOTAL KNEE ARTHROPLASTY Left 07/03/2013   Procedure: LEFT TOTAL KNEE ARTHROPLASTY;  Surgeon: Mauri Pole, MD;  Location: [facility];  Service: Orthopedics;  Laterality: Left;   Social History:   reports that he has quit smoking. He has never used smokeless tobacco. He reports current alcohol use of about 7.0 - 14.0 standard drinks of alcohol per week. He  reports that he does not use drugs.   Family History:  His family history includes Alcohol abuse in his paternal uncle; COPD in his mother; Cancer in his father; Colon cancer (age of onset: 16) in his father; Hypertension in his paternal uncle; Lung cancer in his mother and sister; Prostate cancer in his father; Stroke (age of onset: 71) in his paternal uncle; Urolithiasis in his father. There is no history of Asthma, Heart disease, Esophageal cancer, Stomach cancer, Liver disease, Colon polyps, or Rectal cancer.   Allergies: Allergies  Allergen Reactions   Atorvastatin Other (See Comments)    Increased LFTs   Home Medications: Prior to Admission medications   Medication Sig Start Date End Date Taking? Authorizing Provider  beta carotene 25000 UNIT capsule Take 25,000 Units by mouth daily.   Yes [provider]  clonazePAM (KLONOPIN) 0.5 MG tablet TAKE ONE TABLET BY MOUTH AT BEDTIME AS NEEDED Patient taking differently: Take 0.5 mg by mouth at bedtime as needed (Sleep). 09/07/21  Yes Burns, Claudina Lick, MD  fluticasone (FLONASE) 50 MCG/ACT nasal spray SPRAY ONE SPRAY IN EACH NOSTRIL ONCE DAILY Patient taking differently: Place 1 spray into both nostrils daily. 08/09/22  Yes Burns, Claudina Lick, MD  glycopyrrolate (ROBINUL) 2 MG tablet Take 1 tablet (2 mg total) by mouth every morning. 08/11/22  Yes Esterwood, Amy S, PA-C  ibuprofen (ADVIL) 200 MG tablet Take 200 mg by mouth every 6 (six) hours as needed for moderate pain.   Yes [provider]  levothyroxine (SYNTHROID) 50 MCG tablet Take 1 tablet by mouth daily 02/03/22  Yes Burns, Claudina Lick, MD  pyridOXINE (VITAMIN B-6) 100 MG tablet Take 100 mg by mouth daily.   Yes [provider]  rosuvastatin (CRESTOR) 40 MG tablet Take 1 tablet (40 mg total) by mouth daily. 03/18/22  Yes Burns, Claudina Lick, MD  vitamin C (ASCORBIC ACID) 500 MG tablet Take 500 mg by mouth 2 (two) times daily.   Yes [provider]  vitamin E 400 UNIT  capsule Take 400 Units by mouth daily.   Yes [provider]    Critical care time:    Rhae Lerner [facility] 08/18/22 3:02 PM  Please see [email or web address] for pager details.  From 7A-7P if no response, please call [phone] After hours, please call Warren Lacy [phone]   This patient is critically ill with multiple organ system failure which requires frequent high complexity decision making, assessment, support, evaluation, and titration of therapies. This was completed through the application of advanced monitoring technologies and extensive interpretation of multiple databases. During this encounter critical care time was devoted to patient care  services described in this note for 85 minutes.  Julian Hy, DO 08/18/22 4:25 PM [facility]  For contact information, see Amion. If no response to pager, please call PCCM consult pager. After hours, 7PM- 7AM, please call Elink.

## 2022-08-18 NOTE — Plan of Care (Signed)

## 2022-08-18 NOTE — Progress Notes (Signed)
Plan for operation today.   Reviewed OR materials at length with team and appear sufficient.   80M with pain in left arm / chest for several weeks. Mostly at rest, at times after elliptical as well. Told was a pinched nerve. Went into AF and apple watch said see an MD   Found to have 3v CAD along with highly calcified AV with likely moderate stenosis   Reports recent dental visit, no issues. Does a lot of elliptical and in general appears to have good exercise tolerance.    Plan CAB / AV replace / LAA clip, possible MAZE.   Risks/benefits/alternatives were discussed at length (85% straightforward recovery, 12% morbidity [any organ, predominantly age-related issues I.e. tissue quality, coronary issues related to extensive Ca2 including need for possible endarterectomy, stroke, bleeding, return to OR, damage to surrounding structures, renal issues, 4-5% mortality]. Issues related to valve surgery also discussed. Straightforward recovery typically entails 2-3 ICU days, 3-4 days on the floor, seeing me back in clinic at 1 week and 1 month postoperatively. No lifting greater than 15 lbs for 6 weeks. Total recovery expected by 2 months. All questions were asked and answered. Wife and daughter present for full conversation.     Justice Rocher MD

## 2022-08-18 NOTE — Transfer of Care (Signed)
Immediate Anesthesia Transfer of Care Note  Patient: Jeremy Waters  Procedure(s) Performed: CORONARY ARTERY BYPASS GRAFTING (CABG) X2 USING LEFT INTERNAL MAMMARY ARTERY AND LEFT ENDOSCOPICALLY HARVESTED GREATER SAPHENOUS VEIN. (Chest) AORTIC VALVE REPLACEMENT (AVR) USING EDWARDS INSPIRIS AORTIC VALVE SIZE 23MM (Chest) MAZE USING ATRICURE ISOLATOR OLL2 (Chest) TRANSESOPHAGEAL ECHOCARDIOGRAM CLIPPING OF ATRIAL APPENDAGE USING ATRICURE 9 ATRICLIP (Chest)  Patient Location: ICU  Anesthesia Type:General  Level of Consciousness: sedated and Patient remains intubated per anesthesia plan  Airway & Oxygen Therapy: Patient remains intubated per anesthesia plan and Patient placed on Ventilator (see vital sign flow sheet for setting)  Post-op Assessment: Report given to RN and Post -op Vital signs reviewed and stable  Post vital signs: Reviewed and stable  Last Vitals:  Vitals Value Taken Time  BP 83/72 08/18/22 1451  Temp 35.1 C 08/18/22 1455  Pulse 62 08/18/22 1455  Resp 16 08/18/22 1455  SpO2 100 % 08/18/22 1455  Vitals shown include unvalidated device data.  Last Pain:  Vitals:   08/18/22 0448  TempSrc: Oral  PainSc:          Complications: No notable events documented.

## 2022-08-18 NOTE — Anesthesia Procedure Notes (Signed)
Procedure Name: Intubation Date/Time: 08/18/2022 8:05 AM  Performed by: Valda Favia, CRNAPre-anesthesia Checklist: Patient identified, Emergency Drugs available, Suction available and Patient being monitored Patient Re-evaluated:Patient Re-evaluated prior to induction Oxygen Delivery Method: Circle System Utilized Preoxygenation: Pre-oxygenation with 100% oxygen Induction Type: IV induction Ventilation: Mask ventilation without difficulty Laryngoscope Size: Mac and 4 Grade View: Grade I Tube type: Oral Tube size: 8.0 mm Number of attempts: 1 Airway Equipment and Method: Stylet Placement Confirmation: ETT inserted through vocal cords under direct vision, positive ETCO2 and breath sounds checked- equal and bilateral Secured at: 23 cm Tube secured with: Tape Dental Injury: Teeth and Oropharynx as per pre-operative assessment

## 2022-08-18 NOTE — Op Note (Addendum)
OPERATIVE NOTE: Patient Name: Jeremy Waters Date of Birth: [birth year] Date of Operation: 08/18/22  PRE-OPERATIVE DIAGNOSIS: Coronary artery disease with extensive calcification Aortic Valve Stenosis Severe Aortic valve calcification with immobilized right coronary leaflet Atrial fibrillation, paroxysmal  POST-OPERATIVE DIAGNOSIS: Same  OPERATION: Aortic Valve Replacement (46mm INSPIRIS, SN [id number]) MAZE (Pulmonary vein isolation, bilateral) Left atrial appendage clip (19mm Atriclip) CABG x 2 (LIMA - LAD, SVG - RCA) Endoscopic saphenous vein harvest, left Endoscopic saphenous vein evaluation, right  SURGEON: Pierre Bali Tasmia Blumer MD   ASSISTANT: Enid Cutter, PA  EBL 100cc  FINDINGS: EF 40-45% before surgery EF 50% after surgery   Adequate conduits and targets LAD 1.75 mm RCA 1.5 mm, flow 25 cc/min  TEE Findings: EF reduced preoperatively as above.  Aortic valve with immobile RCA leaflet.  One leaflet moved freely, other one with reduced excursion, significant.  Mean gradient around 60mm Hg.   Post-Surgery TEE: EF improved as above Mean gradient on AV 56mm Hg No perivalvular leak   Generalized coagulopathy at end of case including skin. Coagulation factors sent.   TIMES: XC: 131 min CPB: 166 min   SPECIMENS Aortic valve leaflets, trileaflet   COMPLICATIONS: None  TUBES:  2 24 Fr blakes (left pleural and mediastinal) 1 JP to Bulb (mediastinal)   PROCEDURE IN DETAIL: The patient was brought in the operating room and laid in supine position.  The patient was prepped and draped in standard fashion.  An arterial, pulmonary arterial and venous lines were placed by anesthesia along with a single-lumen endotracheal tube. Vein was harvested out of the left leg endoscopically by the physician assistant. Both sides were evaluated and the right was small when viewed endoscopically. The left leg vein was harvested. Local analgesia given to the sternum. Sternotomy  was performed and LIMA taken down after 5000 u heparin and the pericardium was opened.  Full dose heparin was given to achieve an ACT of 480 and aortic and venous cannulas were inserted.The patient was placed on cardiopulmonary bypass.  Radiofrequency clamp from Atricure placed around right, then left pulmonary veins. 4 ablations on each side. A 45 mm Atriclip was also used on the left atrial appendage.   Using a cross-clamp and cardiac arrest was achieved with 1.2  L of modified blood [place] cardioplegia antegrade. Topical cooling was used as well on the heart.  OM evaluated and quite distal and small for bypass. Circ this distal will have reasonable collaterals from an excellent PDA graft as well. The main circumflex with largest branches were free of obstructive disease by catheterization laboratory report.   Attention turned to the right coronary grafting.  RCA was diffusely calcified all over. This had been evident preop on the CT scan as well. RCA sewn distally with 7-0 prolene. There was only one spot it could be done. This territory was closed to the distal circumflex vascular tree.  Next, 300 cc plegia given down aortic root. Aortotomy and aortic valve exposed. Findings as above. Aortic valve resected, ringed with 15 2-0 non-absorbable pledgeted sutures. These were placed through a 64mm Edwards INSPIRIS valve. Seated and tied with Cor-knot. Aorta closed in 2 layers of running prolene. Subsequent to this, 300 cc additional cardioplegia given.   Attention turned back to RCA proximal, completed with 6-0 prolene.  After vein graft complete, LIMA sewn to LAD with 7-0 prolene.     Next, the cross-clamp was released  with the root vent on.  After de-airing the heart came back into regular  sinus rhythm and the heart took over the circulation. Bypass was weaned and cannulas were removed, then protamine was given and hemostasis was achieved. Chest tubes were placed as above along with ventricular and  atrial wires.  The chest was then closed in interrupted steel wire and the presternal layers were closed in 3 layers of absorbable suture.  The patient had a stable status and was transferred to the postoperative care unit on 0.5 of epinephrine and no Levophed. Nitro was in line to maintain BP MAP 70-80s as well as needed. BP was labile with volume it was very responsive. All surgical counts were correct.

## 2022-08-18 NOTE — Progress Notes (Signed)
  Echocardiogram Echocardiogram Transesophageal has been performed.  Jeremy Waters 08/18/2022, 1:24 PM

## 2022-08-18 NOTE — Anesthesia Postprocedure Evaluation (Signed)
Anesthesia Post Note  Patient: Jeremy Waters  Procedure(s) Performed: CORONARY ARTERY BYPASS GRAFTING (CABG) X2 USING LEFT INTERNAL MAMMARY ARTERY AND LEFT ENDOSCOPICALLY HARVESTED GREATER SAPHENOUS VEIN. (Chest) AORTIC VALVE REPLACEMENT (AVR) USING EDWARDS INSPIRIS AORTIC VALVE SIZE 23MM (Chest) MAZE USING ATRICURE ISOLATOR OLL2 (Chest) TRANSESOPHAGEAL ECHOCARDIOGRAM CLIPPING OF ATRIAL APPENDAGE USING ATRICURE 33 ATRICLIP (Chest)     Patient location during evaluation: SICU Anesthesia Type: General Level of consciousness: sedated Pain management: pain level controlled Vital Signs Assessment: post-procedure vital signs reviewed and stable Respiratory status: patient remains intubated per anesthesia plan Cardiovascular status: stable Postop Assessment: no apparent nausea or vomiting Anesthetic complications: no  No notable events documented.  Last Vitals:  Vitals:   08/18/22 0448 08/18/22 1438  BP: (!) 145/72   Pulse: (!) 53 80  Resp: 17 16  Temp: 36.6 C   SpO2: 98% 99%    Last Pain:  Vitals:   08/18/22 0448  TempSrc: Oral  PainSc:                  Jeremy Waters

## 2022-08-18 NOTE — Anesthesia Preprocedure Evaluation (Addendum)
Anesthesia Evaluation  Patient identified by MRN, date of birth, ID band Patient awake    Reviewed: Allergy & Precautions, NPO status , Patient's Chart, lab work & pertinent test results  History of Anesthesia Complications Negative for: history of anesthetic complications  Airway Mallampati: III  TM Distance: >3 FB Neck ROM: Full    Dental no notable dental hx. (+) Dental Advisory Given   Pulmonary former smoker   Pulmonary exam normal        Cardiovascular + CAD and + Past MI  Normal cardiovascular exam+ dysrhythmias Atrial Fibrillation + Valvular Problems/Murmurs AS   Cath 08/06/22 Narrative   Ost LAD to Prox LAD lesion is 90% stenosed.   Dist RCA lesion is 95% stenosed.   Mid RCA lesion is 80% stenosed.   Dist Cx lesion is 90% stenosed.   Mid LAD lesion is 65% stenosed.   1.  High-grade serial lesions of right coronary artery. 2.  High-grade calcified proximal LAD stenosis; the LAD is a relatively calcified vessel and require long segment stenting peripheral revascularization. 3.  High-grade distal left circumflex disease. 4.  LVEDP of 3 mmHg.   Recommendations: The results were reviewed with Dr. Gwenlyn Found, a cardiothoracic surgical consult will be obtained.  TTE  IMPRESSIONS     1. Left ventricular ejection fraction, by estimation, is 50 to 55%. The  left ventricle has low normal function. The left ventricle has no regional  wall motion abnormalities. Left ventricular diastolic function could not  be evaluated.   2. Right ventricular systolic function is mildly reduced. The right  ventricular size is normal. There is normal pulmonary artery systolic  pressure.   3. Left atrial size was moderately dilated.   4. Right atrial size was moderately dilated.   5. The mitral valve is normal in structure. Trivial mitral valve  regurgitation. No evidence of mitral stenosis.   6. The aortic valve is moderately to  severely calcified. The right  coronary cusp is fused. By gradient AS in mild but visually AS appears  severe and this is supported by the calculated AVA and dimensionless  index. With low SV index suspect paradoxical  low-flow low-gradient AS. The aortic valve is tricuspid. There is moderate  calcification of the aortic valve. Aortic valve regurgitation is trivial.  No aortic stenosis is present. Aortic valve area, by VTI measures 0.96  cm. Aortic valve mean gradient  measures 12.0 mmHg. Aortic valve Vmax measures 2.45 m/s.   7. Aortic dilatation noted. There is borderline dilatation of the  ascending aorta, measuring 38 mm.   8. The inferior vena cava is normal in size with greater than 50%  respiratory variability, suggesting right atrial pressure of 3 mmHg.         Neuro/Psych negative neurological ROS     GI/Hepatic Neg liver ROS,GERD  ,,  Endo/Other  negative endocrine ROS    Renal/GU negative Renal ROS     Musculoskeletal negative musculoskeletal ROS (+)    Abdominal   Peds  Hematology negative hematology ROS (+)   Anesthesia Other Findings   Reproductive/Obstetrics                             Anesthesia Physical Anesthesia Plan  ASA: 4  Anesthesia Plan: General   Post-op Pain Management: Tylenol PO (pre-op)*   Induction: Intravenous  PONV Risk Score and Plan: 3 and Ondansetron, Dexamethasone and Midazolam  Airway Management Planned: Oral ETT  Additional  Equipment: Arterial line, PA Cath, 3D TEE and Ultrasound Guidance Line Placement  Intra-op Plan:   Post-operative Plan: Post-operative intubation/ventilation  Informed Consent: I have reviewed the patients History and Physical, chart, labs and discussed the procedure including the risks, benefits and alternatives for the proposed anesthesia with the patient or authorized representative who has indicated his/her understanding and acceptance.     Dental advisory  given  Plan Discussed with: Anesthesiologist, CRNA and Surgeon  Anesthesia Plan Comments:        Anesthesia Quick Evaluation

## 2022-08-19 ENCOUNTER — Encounter (HOSPITAL_COMMUNITY): Payer: Self-pay | Admitting: Cardiothoracic Surgery

## 2022-08-19 ENCOUNTER — Other Ambulatory Visit: Payer: Self-pay | Admitting: Physician Assistant

## 2022-08-19 ENCOUNTER — Inpatient Hospital Stay (HOSPITAL_COMMUNITY): Payer: PPO

## 2022-08-19 DIAGNOSIS — I214 Non-ST elevation (NSTEMI) myocardial infarction: Secondary | ICD-10-CM | POA: Diagnosis not present

## 2022-08-19 DIAGNOSIS — I480 Paroxysmal atrial fibrillation: Secondary | ICD-10-CM

## 2022-08-19 DIAGNOSIS — Z952 Presence of prosthetic heart valve: Secondary | ICD-10-CM

## 2022-08-19 DIAGNOSIS — Z9911 Dependence on respirator [ventilator] status: Secondary | ICD-10-CM | POA: Diagnosis not present

## 2022-08-19 LAB — CBC
HCT: 25.7 % — ABNORMAL LOW (ref 39.0–52.0)
HCT: 23.9 % — ABNORMAL LOW (ref 39.0–52.0)
Hemoglobin: 8.4 g/dL — ABNORMAL LOW (ref 13.0–17.0)
Hemoglobin: 7.9 g/dL — ABNORMAL LOW (ref 13.0–17.0)
MCH: 33.2 pg (ref 26.0–34.0)
MCH: 33.9 pg (ref 26.0–34.0)
MCHC: 32.7 g/dL (ref 30.0–36.0)
MCHC: 33.1 g/dL (ref 30.0–36.0)
MCV: 101.6 fL — ABNORMAL HIGH (ref 80.0–100.0)
MCV: 102.6 fL — ABNORMAL HIGH (ref 80.0–100.0)
Platelets: 117 10*3/uL — ABNORMAL LOW (ref 150–400)
Platelets: 98 10*3/uL — ABNORMAL LOW (ref 150–400)
RBC: 2.53 MIL/uL — ABNORMAL LOW (ref 4.22–5.81)
RBC: 2.33 MIL/uL — ABNORMAL LOW (ref 4.22–5.81)
RDW: 14 % (ref 11.5–15.5)
RDW: 14.1 % (ref 11.5–15.5)
WBC: 7.1 10*3/uL (ref 4.0–10.5)
WBC: 8.5 10*3/uL (ref 4.0–10.5)
nRBC: 0 % (ref 0.0–0.2)
nRBC: 0 % (ref 0.0–0.2)

## 2022-08-19 LAB — POCT I-STAT 7, (LYTES, BLD GAS, ICA,H+H)
Acid-base deficit: 3 mmol/L — ABNORMAL HIGH (ref 0.0–2.0)
Acid-base deficit: 1 mmol/L (ref 0.0–2.0)
Bicarbonate: 21.6 mmol/L (ref 20.0–28.0)
Bicarbonate: 22.6 mmol/L (ref 20.0–28.0)
Calcium, Ion: 1.13 mmol/L — ABNORMAL LOW (ref 1.15–1.40)
Calcium, Ion: 1.1 mmol/L — ABNORMAL LOW (ref 1.15–1.40)
HCT: 24 % — ABNORMAL LOW (ref 39.0–52.0)
HCT: 25 % — ABNORMAL LOW (ref 39.0–52.0)
Hemoglobin: 8.2 g/dL — ABNORMAL LOW (ref 13.0–17.0)
Hemoglobin: 8.5 g/dL — ABNORMAL LOW (ref 13.0–17.0)
O2 Saturation: 99 %
O2 Saturation: 98 %
Patient temperature: 37.5
Patient temperature: 37.5
Potassium: 4.3 mmol/L (ref 3.5–5.1)
Potassium: 4.3 mmol/L (ref 3.5–5.1)
Sodium: 140 mmol/L (ref 135–145)
Sodium: 140 mmol/L (ref 135–145)
TCO2: 23 mmol/L (ref 22–32)
TCO2: 24 mmol/L (ref 22–32)
pCO2 arterial: 34.7 mmHg (ref 32–48)
pCO2 arterial: 34.6 mmHg (ref 32–48)
pH, Arterial: 7.405 (ref 7.35–7.45)
pH, Arterial: 7.425 (ref 7.35–7.45)
pO2, Arterial: 144 mmHg — ABNORMAL HIGH (ref 83–108)
pO2, Arterial: 101 mmHg (ref 83–108)

## 2022-08-19 LAB — BASIC METABOLIC PANEL
Anion gap: 6 (ref 5–15)
Anion gap: 9 (ref 5–15)
BUN: 7 mg/dL — ABNORMAL LOW (ref 8–23)
BUN: 9 mg/dL (ref 8–23)
CO2: 21 mmol/L — ABNORMAL LOW (ref 22–32)
CO2: 23 mmol/L (ref 22–32)
Calcium: 7 mg/dL — ABNORMAL LOW (ref 8.9–10.3)
Calcium: 7.5 mg/dL — ABNORMAL LOW (ref 8.9–10.3)
Chloride: 110 mmol/L (ref 98–111)
Chloride: 104 mmol/L (ref 98–111)
Creatinine, Ser: 0.86 mg/dL (ref 0.61–1.24)
Creatinine, Ser: 1.16 mg/dL (ref 0.61–1.24)
GFR, Estimated: >60 mL/min (ref >60)
GFR, Estimated: >60 mL/min (ref >60)
Glucose, Bld: 122 mg/dL — ABNORMAL HIGH (ref 70–99)
Glucose, Bld: 168 mg/dL — ABNORMAL HIGH (ref 70–99)
Potassium: 4.1 mmol/L (ref 3.5–5.1)
Potassium: 3.9 mmol/L (ref 3.5–5.1)
Sodium: 137 mmol/L (ref 135–145)
Sodium: 136 mmol/L (ref 135–145)

## 2022-08-19 LAB — GLUCOSE, CAPILLARY
Glucose-Capillary: 125 mg/dL — ABNORMAL HIGH (ref 70–99)
Glucose-Capillary: 101 mg/dL — ABNORMAL HIGH (ref 70–99)
Glucose-Capillary: 139 mg/dL — ABNORMAL HIGH (ref 70–99)
Glucose-Capillary: 101 mg/dL — ABNORMAL HIGH (ref 70–99)
Glucose-Capillary: 143 mg/dL — ABNORMAL HIGH (ref 70–99)
Glucose-Capillary: 170 mg/dL — ABNORMAL HIGH (ref 70–99)
Glucose-Capillary: 168 mg/dL — ABNORMAL HIGH (ref 70–99)
Glucose-Capillary: 163 mg/dL — ABNORMAL HIGH (ref 70–99)
Glucose-Capillary: 130 mg/dL — ABNORMAL HIGH (ref 70–99)
Glucose-Capillary: 159 mg/dL — ABNORMAL HIGH (ref 70–99)
Glucose-Capillary: 132 mg/dL — ABNORMAL HIGH (ref 70–99)
Glucose-Capillary: 125 mg/dL — ABNORMAL HIGH (ref 70–99)

## 2022-08-19 LAB — MAGNESIUM
Magnesium: 2.9 mg/dL — ABNORMAL HIGH (ref 1.7–2.4)
Magnesium: 2.7 mg/dL — ABNORMAL HIGH (ref 1.7–2.4)

## 2022-08-19 LAB — SURGICAL PATHOLOGY

## 2022-08-19 LAB — PHOSPHORUS: Phosphorus: 2.2 mg/dL — ABNORMAL LOW (ref 2.5–4.6)

## 2022-08-19 MED ORDER — ENOXAPARIN SODIUM 40 MG/0.4ML IJ SOSY
40.0000 mg | PREFILLED_SYRINGE | Freq: Every day | INTRAMUSCULAR | Status: DC
  Administered 2022-08-19 – 2022-08-25 (×7): 40 mg via SUBCUTANEOUS
  Filled 2022-08-19 (×7): qty 0.4

## 2022-08-19 MED ORDER — SODIUM PHOSPHATES 45 MMOLE/15ML IV SOLN
15.0000 mmol | Freq: Once | INTRAVENOUS | Status: AC
  Administered 2022-08-19: 15 mmol via INTRAVENOUS
  Filled 2022-08-19: qty 5

## 2022-08-19 MED ORDER — POTASSIUM CHLORIDE CRYS ER 10 MEQ PO TBCR
10.0000 meq | EXTENDED_RELEASE_TABLET | Freq: Once | ORAL | Status: AC
  Administered 2022-08-19: 10 meq via ORAL
  Filled 2022-08-19: qty 1

## 2022-08-19 MED ORDER — INSULIN DETEMIR 100 UNIT/ML ~~LOC~~ SOLN
10.0000 [IU] | Freq: Once | SUBCUTANEOUS | Status: AC
  Administered 2022-08-19: 10 [IU] via SUBCUTANEOUS
  Filled 2022-08-19: qty 0.1

## 2022-08-19 MED ORDER — FUROSEMIDE 10 MG/ML IJ SOLN
20.0000 mg | Freq: Once | INTRAMUSCULAR | Status: AC
  Administered 2022-08-19: 20 mg via INTRAVENOUS
  Filled 2022-08-19: qty 2

## 2022-08-19 MED ORDER — AMIODARONE LOAD VIA INFUSION
150.0000 mg | Freq: Once | INTRAVENOUS | Status: AC
  Administered 2022-08-19: 150 mg via INTRAVENOUS
  Filled 2022-08-19: qty 83.34

## 2022-08-19 MED ORDER — INSULIN ASPART 100 UNIT/ML IJ SOLN
0.0000 [IU] | INTRAMUSCULAR | Status: DC
  Administered 2022-08-19 – 2022-08-22 (×12): 2 [IU] via SUBCUTANEOUS

## 2022-08-19 MED ORDER — ORAL CARE MOUTH RINSE
15.0000 mL | Freq: Four times a day (QID) | OROMUCOSAL | Status: DC
  Administered 2022-08-19 – 2022-08-22 (×11): 15 mL via OROMUCOSAL

## 2022-08-19 MED ORDER — AMIODARONE HCL IN DEXTROSE 360-4.14 MG/200ML-% IV SOLN
60.0000 mg/h | INTRAVENOUS | Status: DC
  Administered 2022-08-20 – 2022-08-22 (×5): 60 mg/h via INTRAVENOUS
  Filled 2022-08-19 (×9): qty 200

## 2022-08-19 MED ORDER — AMIODARONE HCL IN DEXTROSE 360-4.14 MG/200ML-% IV SOLN
60.0000 mg/h | INTRAVENOUS | Status: AC
  Administered 2022-08-19 – 2022-08-20 (×2): 60 mg/h via INTRAVENOUS
  Filled 2022-08-19 (×2): qty 200

## 2022-08-19 NOTE — Discharge Summary (Signed)
[address]       [place] [address]             [phone]    Physician Discharge Summary  Patient ID: Jeremy Waters MRN: [id number] DOB/AGE: January 01, 1946 77 y.o.  Admit date: 08/15/2022 Discharge date: 08/26/2022  Admission Diagnoses:  Patient Active Problem List   Diagnosis Date Noted   Hypotension 08/18/2022   NSTEMI (non-ST elevated myocardial infarction) ([place]) 08/15/2022   Atrial fibrillation ([place]) 08/15/2022   Left arm pain 07/29/2022   Cervical radiculopathy 07/29/2022   GERD (gastroesophageal reflux disease) 03/18/2022   Murmur 02/11/2021   Premature beats 02/11/2021   Frequent PVCs 02/11/2021   LUQ pain 01/16/2019   Right cervical radiculopathy 03/03/2017   Spinal stenosis, lumbar 10/08/2015   Sensorineural hearing loss of both ears 04/10/2014   Organic impotence 04/10/2014   Hyperglycemia 04/02/2014   S/P left TKA 07/03/2013   Left knee DJD 06/07/2013   Hypothyroidism 03/03/2012   Axillary mass, right 04/07/2011   ROSACEA 03/19/2010   ARTHRALGIA 03/19/2010   CERVICALGIA 09/05/2009   CARPAL TUNNEL SYNDROME 06/17/2008   DEGENERATIVE JOINT DISEASE, GENERALIZED 06/17/2008   CERVICAL RADICULOPATHY, LEFT 06/17/2008   Disturbance in sleep behavior 03/04/2008   PROSTATE CANCER, HX OF 03/04/2008   Hyperlipidemia 06/15/2007   DIVERTICULOSIS, COLON W/O HEM 03/03/2007   Discharge Diagnoses:  Patient Active Problem List   Diagnosis Date Noted   Pleural effusion 08/25/2022   Localized edema 08/25/2022   Atrial fibrillation with RVR 08/25/2022   S/P aortic valve replacement 08/18/2022   Hypotension 08/18/2022   S/P CABG x 2 08/18/2022   On mechanically assisted ventilation 08/18/2022   NSTEMI (non-ST elevated myocardial infarction) 08/15/2022   Atrial fibrillation 08/15/2022   Left arm pain 07/29/2022   Cervical radiculopathy 07/29/2022   GERD (gastroesophageal reflux disease) 03/18/2022   Murmur 02/11/2021   Premature beats 02/11/2021    Frequent PVCs 02/11/2021   LUQ pain 01/16/2019   Right cervical radiculopathy 03/03/2017   Spinal stenosis, lumbar 10/08/2015   Sensorineural hearing loss of both ears 04/10/2014   Organic impotence 04/10/2014   Hyperglycemia 04/02/2014   S/P left TKA 07/03/2013   Left knee DJD 06/07/2013   Hypothyroidism 03/03/2012   Axillary mass, right 04/07/2011   ROSACEA 03/19/2010   ARTHRALGIA 03/19/2010   CERVICALGIA 09/05/2009   CARPAL TUNNEL SYNDROME 06/17/2008   DEGENERATIVE JOINT DISEASE, GENERALIZED 06/17/2008   CERVICAL RADICULOPATHY, LEFT 06/17/2008   Disturbance in sleep behavior 03/04/2008   PROSTATE CANCER, HX OF 03/04/2008   Hyperlipidemia 06/15/2007   DIVERTICULOSIS, COLON W/O HEM 03/03/2007   Discharged Condition: good  History of Present Illness:  Jeremy Waters is a 77 yo male with history of heart murmur, HLD, and Thyroid disease.  He presented to the emergency department on 08/15/2022 after his Apple Watch alerted him that he could possibly be in atrial fibrillation.  Upon reviewing his app it stated that he had had intermittent A-fib for the past several hours.  He did not note any symptoms but because of this finding he was concerned enough to go to the emergency department for further evaluation.  He did note that he has had some intermittent left shoulder/arm pain recently that was nonexertional.  The symptoms were present typically in the morning and last for 30 minutes to 1 hour.  He felt the symptoms were most likely musculoskeletal.  The patient is physically active and uses the elliptical frequently in the morning and these symptoms  were not exacerbated during its use.  Additionally he did not note any significant decrease in his exercise capacity.  In the emergency department he was noted to be in atrial fibrillation with RVR in the 100 to 130 bpm range.  In the emergency department abnormal labs included an elevated BNP of 352 as well as elevated high-sensitivity troponin  which trended higher over time to 2000 as he ruled in for non-STEMI.  He was started on aspirin as well as a heparin drip and cardiology consultation was obtained to assist with management and further diagnostic evaluation.  Hospital Course:  Echocardiogram was obtained and showed an EF of 50-55% with moderate Aortic Stenosis.  Cardiac catheterization was also performed and showed multivessel CAD.  It was felt the patient would require coronary bypass grafting with Aortic Valve Replacement.  Cardiothoracic surgery consultation was requested and the patient was evaluated by Dr. Tenny Craw.  He was felt to be a good surgical candidate.  The risks and benefits of the procedure were explained to the patient and he was agreeable to proceed.  He was taken to the operating room on 08/18/2022.  He underwent CABG x 2 utilizing LIMA to LAD, SVG to RCA.  He also underwent Aortic Valve Replacement with a 23 mm Edwards Inspiris Valve.  Finally he underwent endoscopic harvest of the greater saphenous vein from his left leg, his right leg was explored.  He tolerated the procedure without difficulty and was taken to the SICU in stable condition.  He was weaned and extubated on POD #1.  He was weaned off Levophed and Epinephrine as hemodynamics allowed.  He was started on gentle diuretics for mild volume overloaded state.  The patient developed Atrial Fibrillation with RVR.  He was treated with Amiodarone bolus and drip.  He converted very briefly and went right back into Atrial Fibrillation.  Repeat Amiodarone bolus was administered.  His chest tubes were removed without difficulty.  His potassium was at 3.5 and in the setting of Atrial Fibrillation he was treated with 40 meq IV.  He responded well to IV lasix, thus this was repeated.  By the third postoperative day, he had converted back to sinus rhythm briefly and then had recurrent A-fib.  The vasopressor and inotropic support were continued.  On postop day 4, he was transfused  with 1 unit of packed red blood cells for hemoglobin of 7.  Anticoagulation was not initiated since he had his left atrial appendage isolated at the time of surgery.  The patients appetite was poor.  It was felt the Amiodarone could be attributing to this and was discontinued.  He developed a bump in his creatinine level likely due to diuretics.  He remained volume overloaded and was treated with Albumin and IV diuretics. Subsequent creatinine level was 1.38.  He was transfused additional packed cells for post operative anemia.  The patients CXR showed bilateral pleural effusions.  These were not felt to require Thoracentesis.  IV diuretics were continued.  He was bradycardic and kept hooked up to A pacing.  He remained in his baseline bradycardic rhythm and tolerated without difficulty.  His pacing wires were removed without difficulty.  His surgical incisions are healing without evidence of infection.  He was felt medically stable for discharge by Dr. Tenny Craw.  Consults: pulmonary/intensive care  Significant Diagnostic Studies: cardiac graphics:   Angiography:    Ost LAD to Prox LAD lesion is 90% stenosed.   Dist RCA lesion is 95% stenosed.   Mid  RCA lesion is 80% stenosed.   Dist Cx lesion is 90% stenosed.   Mid LAD lesion is 65% stenosed.   1.  High-grade serial lesions of right coronary artery. 2.  High-grade calcified proximal LAD stenosis; the LAD is a relatively calcified vessel and require long segment stenting peripheral revascularization. 3.  High-grade distal left circumflex disease. 4.  LVEDP of 3 mmHg.   Recommendations: The results were reviewed with Dr. Gwenlyn Found, a cardiothoracic surgical consult will be obtained.  Echocardiogram:  IMPRESSIONS    1. Left ventricular ejection fraction, by estimation, is 50 to 55%. The  left ventricle has low normal function. The left ventricle has no regional  wall motion abnormalities. Left ventricular diastolic function could not  be  evaluated.   2. Right ventricular systolic function is mildly reduced. The right  ventricular size is normal. There is normal pulmonary artery systolic  pressure.   3. Left atrial size was moderately dilated.   4. Right atrial size was moderately dilated.   5. The mitral valve is normal in structure. Trivial mitral valve  regurgitation. No evidence of mitral stenosis.   6. The aortic valve is moderately to severely calcified. The right  coronary cusp is fused. By gradient AS in mild but visually AS appears  severe and this is supported by the calculated AVA and dimensionless  index. With low SV index suspect paradoxical  low-flow low-gradient AS. The aortic valve is tricuspid. There is moderate  calcification of the aortic valve. Aortic valve regurgitation is trivial.  No aortic stenosis is present. Aortic valve area, by VTI measures 0.96  cm. Aortic valve mean gradient  measures 12.0 mmHg. Aortic valve Vmax measures 2.45 m/s.   7. Aortic dilatation noted. There is borderline dilatation of the  ascending aorta, measuring 38 mm.   8. The inferior vena cava is normal in size with greater than 50%  respiratory variability, suggesting right atrial pressure of 3 mmHg.   Treatments: surgery:      OPERATIVE NOTE: Patient Name: Jeremy Waters Date of Birth: 02-Jul-1945 Date of Operation: 08/18/22   PRE-OPERATIVE DIAGNOSIS: Coronary artery disease with extensive calcification Aortic Valve Stenosis Severe Aortic valve calcification with immobilized right coronary leaflet Atrial fibrillation, paroxysmal   POST-OPERATIVE DIAGNOSIS: Same   OPERATION: Aortic Valve Replacement (92mm INSPIRIS, SN [id number]) MAZE (Pulmonary vein isolation, bilateral) Left atrial appendage clip (13mm Atriclip) CABG x 2 (LIMA - LAD, SVG - RCA) Endoscopic saphenous vein harvest, left Endoscopic saphenous vein evaluation, right   SURGEON: Pierre Bali Enter MD   ASSISTANT: Enid Cutter, PA        Discharge Exam: Blood pressure 121/72, pulse 66, temperature 98.1 F (36.7 C), temperature source Oral, resp. rate 16, height 5\' 7"  (1.702 m), weight 67.4 kg, SpO2 97 %.  General appearance: alert, cooperative, and no distress Heart: regular rate and rhythm Lungs: diminished breath sounds bibasilar Abdomen: soft, non-tender; bowel sounds normal; no masses,  no organomegaly Extremities: edema trace, improving Wound: clean and dry  Discharge Medications:  The patient has been discharged on:   1.Beta Blocker:  Yes [   ]                              No   [ x  ]                              If  No, reason: Bradycardia, Hypotension  2.Ace Inhibitor/ARB: Yes [   ]                                     No  [ x   ]                                     If No, reason: Hypotension, AKI  3.Statin:   Yes [ x  ]                  No  [   ]                  If No, reason:  4.Ecasa:  Yes  [ x  ]                  No   [   ]                  If No, reason:  Patient had ACS upon admission: Yes  Plavix/P2Y12 inhibitor: Yes [ Y  ]                                      No  [   ]     Discharge Instructions     Amb Referral to Cardiac Rehabilitation   Complete by: As directed    Diagnosis:  CABG Valve Replacement     Valve: Aortic   CABG X ___: 2   After initial evaluation and assessments completed: Virtual Based Care may be provided alone or in conjunction with Phase 2 Cardiac Rehab based on patient barriers.: Yes   Intensive Cardiac Rehabilitation (ICR) [place] location only OR Traditional Cardiac Rehabilitation (TCR) *If criteria for ICR are not met will enroll in TCR [facility] only): Yes      Allergies as of 08/26/2022       Reactions   Atorvastatin Other (See Comments)   Increased LFTs        Medication List     STOP taking these medications    ibuprofen 200 MG tablet Commonly known as: ADVIL       TAKE these medications    ascorbic acid 500 MG tablet Commonly known as:  VITAMIN C Take 500 mg by mouth 2 (two) times daily.   aspirin EC 81 MG tablet Take 1 tablet (81 mg total) by mouth daily.   beta carotene 25000 UNIT capsule Take 25,000 Units by mouth daily.   clonazePAM 0.5 MG tablet Commonly known as: KLONOPIN TAKE ONE TABLET BY MOUTH AT BEDTIME AS NEEDED What changed: reasons to take this   clopidogrel 75 MG tablet Commonly known as: Plavix Take 1 tablet (75 mg total) by mouth daily.   fluticasone 50 MCG/ACT nasal spray Commonly known as: FLONASE SPRAY ONE SPRAY IN EACH NOSTRIL ONCE DAILY What changed:  how much to take how to take this when to take this additional instructions   furosemide 20 MG tablet Commonly known as: LASIX Take 1 tablet (20 mg total) by mouth daily. X 3 days, then use daily as needed for weight gain of 3-5 lbs in 24 hr time period   glycopyrrolate 2 MG tablet Commonly known as: ROBINUL Take  1 tablet (2 mg total) by mouth every morning.   levothyroxine 50 MCG tablet Commonly known as: SYNTHROID Take 1 tablet by mouth daily   midodrine 10 MG tablet Commonly known as: PROAMATINE Take 1 tablet (10 mg total) by mouth 3 (three) times daily with meals. X 7 days, then decrease to 1 tab BID x 7 days, then decrease to 10 mg daily x 7 days then stop   potassium chloride 10 MEQ tablet Commonly known as: KLOR-CON M Take 1 tablet (10 mEq total) by mouth daily. X 3 days, then daily as needed on days you take Lasix   pyridOXINE 100 MG tablet Commonly known as: VITAMIN B6 Take 100 mg by mouth daily.   rosuvastatin 40 MG tablet Commonly known as: CRESTOR Take 1 tablet (40 mg total) by mouth daily.   traMADol 50 MG tablet Commonly known as: ULTRAM Take 1 tablet (50 mg total) by mouth every 6 (six) hours as needed for moderate pain.   vitamin E 180 MG (400 UNITS) capsule Take 400 Units by mouth daily.               Durable Medical Equipment  (From admission, onward)           Start     Ordered    08/23/22 1617  For home use only DME 4 wheeled rolling walker with seat  Once       Question:  Patient needs a walker to treat with the following condition  Answer:  S/P CABG x 2   08/23/22 1617   08/23/22 0818  For home use only DME Walker rolling  Once       Question Answer Comment  Walker: With [place]   Patient needs a walker to treat with the following condition Physical deconditioning      08/23/22 0818   08/23/22 0818  For home use only DME 3 n 1  Once        08/23/22 0818            Follow-up Information     Enter, Pierre Bali, MD Follow up on 09/09/2022.   Specialties: Cardiothoracic Surgery, Cardiology Why: Appointment is at 1:00 Contact information: [address] [place] [address] [phone]         Deberah Pelton, NP Follow up on 09/07/2022.   Specialty: Cardiology Why: Appointment is at 2:20 Contact information: [place] [address] [phone]                 Signed:  Ellwood Handler, PA-C  08/26/2022, 9:01 AM

## 2022-08-19 NOTE — Hospital Course (Addendum)
History of Present Illness:  Jeremy Waters is a 77 yo male with history of heart murmur, HLD, and Thyroid disease.  He presented to the emergency department on 08/15/2022 after his Apple Watch alerted him that he could possibly be in atrial fibrillation.  Upon reviewing his app it stated that he had had intermittent A-fib for the past several hours.  He did not note any symptoms but because of this finding he was concerned enough to go to the emergency department for further evaluation.  He did note that he has had some intermittent left shoulder/arm pain recently that was nonexertional.  The symptoms were present typically in the morning and last for 30 minutes to 1 hour.  He felt the symptoms were most likely musculoskeletal.  The patient is physically active and uses the elliptical frequently in the morning and these symptoms were not exacerbated during its use.  Additionally he did not note any significant decrease in his exercise capacity.  In the emergency department he was noted to be in atrial fibrillation with RVR in the 100 to 130 bpm range.  In the emergency department abnormal labs included an elevated BNP of 352 as well as elevated high-sensitivity troponin which trended higher over time to 2000 as he ruled in for non-STEMI.  He was started on aspirin as well as a heparin drip and cardiology consultation was obtained to assist with management and further diagnostic evaluation.  Hospital Course:  Echocardiogram was obtained and showed an EF of 50-55% with moderate Aortic Stenosis.  Cardiac catheterization was also performed and showed multivessel CAD.  It was felt the patient would require coronary bypass grafting with Aortic Valve Replacement.  Cardiothoracic surgery consultation was requested and the patient was evaluated by Dr. Tenny Craw.  He was felt to be a good surgical candidate.  The risks and benefits of the procedure were explained to the patient and he was agreeable to proceed.  He was taken  to the operating room on 08/18/2022.  He underwent CABG x 2 utilizing LIMA to LAD, SVG to RCA.  He also underwent Aortic Valve Replacement with a 23 mm Edwards Inspiris Valve.  Finally he underwent endoscopic harvest of the greater saphenous vein from his left leg, his right leg was explored.  He tolerated the procedure without difficulty and was taken to the SICU in stable condition.  He was weaned and extubated on POD #1.  He was weaned off Levophed and Epinephrine as hemodynamics allowed.  He was started on gentle diuretics for mild volume overloaded state.  The patient developed Atrial Fibrillation with RVR.  He was treated with Amiodarone bolus and drip.  He converted very briefly and went right back into Atrial Fibrillation.  Repeat Amiodarone bolus was administered.  His chest tubes were removed without difficulty.  His potassium was at 3.5 and in the setting of Atrial Fibrillation he was treated with 40 meq IV.  He responded well to IV lasix, thus this was repeated.  By the third postoperative day, he had converted back to sinus rhythm briefly and then had recurrent A-fib.  The vasopressor and inotropic support were continued.  On postop day 4, he was transfused with 1 unit of packed red blood cells for hemoglobin of 7.  Anticoagulation was not initiated since he had his left atrial appendage isolated at the time of surgery.  The patients appetite was poor.  It was felt the Amiodarone could be attributing to this and was discontinued.  He developed a bump  in his creatinine level likely due to diuretics.  He remained volume overloaded and was treated with Albumin and IV diuretics. Subsequent creatinine level was 1.38.  He was transfused additional packed cells for post operative anemia.  The patients CXR showed bilateral pleural effusions.  These were not felt to require Thoracentesis.  IV diuretics were continued.  He was bradycardic and kept hooked up to A pacing.  He remained in his baseline bradycardic  rhythm and tolerated without difficulty.  His pacing wires were removed without difficulty.  His surgical incisions are healing without evidence of infection.  He was felt medically stable for discharge by Dr. Tenny Craw.

## 2022-08-19 NOTE — Addendum Note (Signed)
Addendum  created 08/19/22 1204 by Duane Boston, MD   Child order released for a procedure order, Clinical Note Signed, Intraprocedure Blocks edited, SmartForm saved

## 2022-08-19 NOTE — Discharge Instructions (Signed)

## 2022-08-19 NOTE — Progress Notes (Addendum)
NAME:  JAMEZ LEVEE, MRN:  [id number], DOB:  10/30/1945, LOS: 4 ADMISSION DATE:  08/15/2022 CONSULTATION DATE:  08/18/2022 REFERRING MD:  Tenny Craw - TCTS CHIEF COMPLAINT:  S/p CABG, AVR, MAZE  History of Present Illness:  Mr. Sandefer is seen in consultation at the request of Dr. Tenny Craw (TCTS) for management of mechanical ventilation and hemodynamics post-CABG/AVR/MAZE.  77 year old man who presented to [facility] 3/24 after his Apple Watch alerted him of possible AFib. Reported several weeks of intermittent L arm/shoulder pain lasting ~1 hour. PMHx significant for CAD, mild AS, PVCs (followed by Cardiology - Dr. Harl Bowie), HLD, hypothyroidism, prostate CA, former tobacco use.   In ER, noted to be in Afib with RVR (rates up to 130s). BNP and troponins were elevated with concern for NSTEMI. ASA/heparin gtt were started and Cardiology consulted. Echo 3/25 demonstrated EF 50-55%, low normal LV function, mildly reduced RV function with dilated LA/RA and findings concerning for low-flow low-gradient AS. LHC was completed 3/25 demonstrating 3-vessel CAD (RCA, LAD, LCx). TCTS consult was recommended and patient subsequently underwent AVR, CABG x 2 (LIMA-LAD, SVG-RCA) and MAZE procedure/Atriclip with Dr. Tenny Craw 3/27.  Pertinent Medical History:   Past Medical History:  Diagnosis Date   Adenomatous colon polyp 09/2007   Arthritis    OA / PAIN LEFT KNEE   Cancer ([place])    prostate cancer   Cataract    removed both eyes   COVID-19 virus infection    Diverticulosis of colon    w/o hemorrage   Heart murmur    TOLD HE HAS A SLIGHT MURMUR   Hyperlipidemia    Inguinal hernia    left side    Thyroid disease    Significant Hospital Events: Including procedures, antibiotic start and stop dates in addition to other pertinent events   3/24 - Admit for Afib/RVR and NSTEMI. 3/25 - Echo EF 50-55%, low normal LV function, mildly reduced RV function with dilated LA/RA and findings concerning for low-flow low-gradient  AS., LHC with 3-vessel disease. TCTS consulted. 3/27 - POD#0 AVR, CABG x 2, MAZE, AtriClip (Enter). CCM consulted for MV/hemodynamics.  Interim History / Subjective:  Remains on epi, NE overnight.   Objective:  Blood pressure 93/66, pulse 94, temperature 99.1 F (37.3 C), resp. rate 13, height 5\' 7"  (1.702 m), weight 74 kg, SpO2 100 %. PAP: (16-41)/(6-23) 19/10 CO:  [3.2 L/min-4.2 L/min] 3.9 L/min CI:  [1.8 L/min/m2-2.4 L/min/m2] 2.2 L/min/m2  Vent Mode: SIMV;PSV;PRVC FiO2 (%):  [50 %] 50 % Set Rate:  [16 bmp] 16 bmp Vt Set:  [520 mL] 520 mL PEEP:  [5 cmH20] 5 cmH20 Pressure Support:  [10 cmH20] 10 cmH20 Plateau Pressure:  [14 cmH20-16 cmH20] 14 cmH20   Intake/Output Summary (Last 24 hours) at 08/19/2022 0711 Last data filed at 08/19/2022 0700 Gross per 24 hour  Intake 6081.77 ml  Output 6196 ml  Net -114.23 ml    Filed Weights   08/15/22 1950 08/16/22 1135 08/19/22 0500  Weight: 68.2 kg 65.7 kg 74 kg   Physical Examination: General: critically ill appearing man sitting up in bed in NAD HEENT: [place]/AT, eyes anicteric Neuro: awake, alert, following commands CV: S1S2, Swan ganz catheter in place PULM: breathing comfortably, CTAB GI: soft, NT Extremities: minimal peripheral edema Skin: no diffuse rashes, warm,dry   BUN 7 Cr 0.86 WBC 7.1 H/H 8.4/25.7 Platelets 117  Resolved Hospital Problem List:  Hypokalemia hypomagnesemia  Assessment & Plan:    Post-op vent management - rapid wean this morning; extubated to  [place] -wean supplemental O2 -pulmonary hygiene  S/p AVR 3/27 NSTEMI, CAD, S/p 2-vessel CABG, LIMA-LAD and SVG-RCA 3/27 - swan to be removed today -wean off epinephrine as able to maintain MAP >65 -hold on starting metoprolol until off inotropes -aspirin, statin -can start plavix when cleared by TCTS -complete post-op antibiotics -pain control per protocol -progressive mobility  Paroxysmal atrial fibrillation, S/p MAZE 3/27 and AtriClip Afib with  RVR New Afib with RVR noted on ED presentation 3/24. CHA2DS2-VASc score 3 on admission. - monitor electrolytes, replete as needed -tele monitoring -eventually needs AC when stable from surgical standpoint.  Hypophosphatemia -repleted  Hypothyroidism - Con't PTA levothyroxine  ABLA, expected post-op Consumptive thrombocytopenia, expected post-op -transfuse for Hb <7 or hemodynamically significant bleeding -con't to monitor  Best Practice: (right click and "Reselect all SmartList Selections" daily)   Per Primary Team (TCTS)  Labs:  CBC: Recent Labs  Lab 08/15/22 1631 08/16/22 0219 08/17/22 0605 08/18/22 0206 08/18/22 0808 08/18/22 1135 08/18/22 1214 08/18/22 1507 08/18/22 1515 08/18/22 2122 08/18/22 2126 08/19/22 0406  WBC 8.5   < > 7.7 7.0  --   --   --   --  6.3 5.3  --  7.1  NEUTROABS 5.4  --   --   --   --   --   --   --   --   --   --   --   HGB 14.6   < > 13.7 13.5   < > 9.6   < > 8.8* 8.6* 8.2* 7.5* 8.4*  HCT 42.8   < > 40.2 38.6*   < > 29.5*   < > 26.0* 26.2* 24.1* 22.0* 25.7*  MCV 96.4   < > 97.6 97.0  --   --   --   --  101.2* 99.6  --  101.6*  PLT 262   < > 213 182  --  133*  --   --  101* 117*  --  117*   < > = values in this interval not displayed.    Basic Metabolic Panel: Recent Labs  Lab 08/15/22 1631 08/16/22 0219 08/18/22 0206 08/18/22 0808 08/18/22 1122 08/18/22 1214 08/18/22 1302 08/18/22 1305 08/18/22 1507 08/18/22 2122 08/18/22 2126 08/19/22 0406  NA 139 137 137   < > 142 141 142 142 142 139 141 137  K 3.9 3.8 3.7   < > 4.2 4.2 3.9 3.9 3.7 4.3 4.3 4.1  CL 105 106 105   < > 107 107 107  --   --  112*  --  110  CO2 25 25 22   --   --   --   --   --   --  21*  --  21*  GLUCOSE 106* 109* 95   < > 118* 104* 123*  --   --  147*  --  122*  BUN 14 13 14    < > 10 9 11   --   --  9  --  7*  CREATININE 0.89 0.87 0.96   < > 0.60* 0.60* 0.60*  --   --  0.86  --  0.86  CALCIUM 10.3 8.9 8.8*  --   --   --   --   --   --  7.0*  --  7.0*  MG 2.2   --   --   --   --   --   --   --   --  2.9*  --  2.9*  PHOS  --   --   --   --   --   --   --   --   --   --   --  2.2*   < > = values in this interval not displayed.    GFR: Estimated Creatinine Clearance: 69.4 mL/min (by C-G formula based on SCr of 0.86 mg/dL). Recent Labs  Lab 08/18/22 0206 08/18/22 1515 08/18/22 2122 08/19/22 0406  WBC 7.0 6.3 5.3 7.1      Critical care time:        This patient is critically ill with multiple organ system failure which requires frequent high complexity decision making, assessment, support, evaluation, and titration of therapies. This was completed through the application of advanced monitoring technologies and extensive interpretation of multiple databases. During this encounter critical care time was devoted to patient care services described in this note for 35 minutes.  Julian Hy, DO 08/19/22 7:11 AM  Pulmonary & Critical Care  For contact information, see Amion. If no response to pager, please call PCCM consult pager. After hours, 7PM- 7AM, please call Elink.

## 2022-08-19 NOTE — Progress Notes (Signed)
Patient ID: Jeremy Waters, male   DOB: 1946-05-23, 77 y.o.   MRN: [id number] TCTS Evening Rounds:  Hemodynamically stable but still on epi 0.5 and NE 5.5.  CI 2.3.  UO good with lasix.  CT output low.

## 2022-08-19 NOTE — Procedures (Signed)
Extubation Procedure Note  Patient Details:   Name: BANX SCHACHT DOB: February 11, 1946 MRN: [id number]   Airway Documentation:    Vent end date: 08/19/22 Vent end time: 0905   Evaluation  O2 sats: stable throughout Complications: No apparent complications Patient did tolerate procedure well. Bilateral Breath Sounds: Clear, Diminished   Yes, pt could speak post extubation.  Pt extubated to 4 l/m [place] without difficulty.  Earney Navy 08/19/2022, 9:06 AM

## 2022-08-19 NOTE — Progress Notes (Signed)
6 week post AVR echo ordered

## 2022-08-19 NOTE — Progress Notes (Addendum)
[address]       [place] [address]             [phone]      1 Day Post-Op Procedure(s) (LRB): CORONARY ARTERY BYPASS GRAFTING (CABG) X2 USING LEFT INTERNAL MAMMARY ARTERY AND LEFT ENDOSCOPICALLY HARVESTED GREATER SAPHENOUS VEIN. (N/A) AORTIC VALVE REPLACEMENT (AVR) USING EDWARDS INSPIRIS AORTIC VALVE SIZE 23MM (N/A) MAZE USING ATRICURE ISOLATOR OLL2 (N/A) TRANSESOPHAGEAL ECHOCARDIOGRAM (N/A) CLIPPING OF ATRIAL APPENDAGE USING ATRICURE 45 ATRICLIP (N/A)  Subjective:  Patient remains intubated...Marland Kitchen per nursing weaning was not attempted overnight due to orders. the patient is sitting up in bed he is alert and responsive..  responds to commands.  Denies pain  Objective: Vital signs in last 24 hours: Temp:  [94.6 F (34.8 C)-99.3 F (37.4 C)] 99.1 F (37.3 C) (03/28 0700) Pulse Rate:  [62-100] 94 (03/28 0700) Cardiac Rhythm: Atrial paced (03/27 2000) Resp:  [0-23] 13 (03/28 0700) BP: (80-182)/(63-119) 93/66 (03/28 0700) SpO2:  [99 %-100 %] 100 % (03/28 0700) Arterial Line BP: (61-207)/(43-198) 100/55 (03/28 0700) FiO2 (%):  [50 %] 50 % (03/28 0314) Weight:  [74 kg] 74 kg (03/28 0500)  Hemodynamic parameters for last 24 hours: PAP: (16-41)/(6-23) 19/10 CO:  [3.2 L/min-4.2 L/min] 3.9 L/min CI:  [1.8 L/min/m2-2.4 L/min/m2] 2.2 L/min/m2  Intake/Output from previous day: 03/27 0701 - 03/28 0700 In: 6081.8 [I.V.:3536.5; Blood:550; NG/GT:75; IV Piggyback:1920.2] Out: 6196 [id number]; Drains:93; Blood:846; Chest Tube:726]  General appearance: alert, cooperative, and no distress Heart: regular rate and rhythm Lungs: clear to auscultation bilaterally Abdomen: soft, non-tender; bowel sounds normal; no masses,  no organomegaly Extremities: edema trace  Wound: aquacel on sternotomy, EVH site clean and dry, some minor bruising  Lab Results: Recent Labs    08/18/22 2122 08/18/22 2126 08/19/22 0406  WBC 5.3  --  7.1  HGB 8.2* 7.5* 8.4*  HCT 24.1*  22.0* 25.7*  PLT 117*  --  117*   BMET:  Recent Labs    08/18/22 2122 08/18/22 2126 08/19/22 0406  NA 139 141 137  K 4.3 4.3 4.1  CL 112*  --  110  CO2 21*  --  21*  GLUCOSE 147*  --  122*  BUN 9  --  7*  CREATININE 0.86  --  0.86  CALCIUM 7.0*  --  7.0*    PT/INR:  Recent Labs    08/18/22 1515  LABPROT 19.8*  INR 1.7*   ABG    Component Value Date/Time   PHART 7.308 (L) 08/18/2022 2126   HCO3 21.0 08/18/2022 2126   TCO2 22 08/18/2022 2126   ACIDBASEDEF 5.0 (H) 08/18/2022 2126   O2SAT 99 08/18/2022 2126   CBG (last 3)  Recent Labs    08/19/22 0520 08/19/22 0603 08/19/22 0705  GLUCAP 101* 143* 170*    Assessment/Plan: S/P Procedure(s) (LRB): CORONARY ARTERY BYPASS GRAFTING (CABG) X2 USING LEFT INTERNAL MAMMARY ARTERY AND LEFT ENDOSCOPICALLY HARVESTED GREATER SAPHENOUS VEIN. (N/A) AORTIC VALVE REPLACEMENT (AVR) USING EDWARDS INSPIRIS AORTIC VALVE SIZE 23MM (N/A) MAZE USING ATRICURE ISOLATOR OLL2 (N/A) TRANSESOPHAGEAL ECHOCARDIOGRAM (N/A) CLIPPING OF ATRIAL APPENDAGE USING ATRICURE 45 ATRICLIP (N/A)  CV- NSR, BP is in-accurate as arterial line is not working well... he is on Epi at 0.5 and Levo at 5.. wean drips as hemodynamics allow today Pulm- wean to extubate this morning, CXR w/o pneumothorax significant effusion, CT output >700 cc will leave in place today Renal- creatinine, lytes are okay.. if weight is accurate up about  18 lbs.. will give low dose IV lasix, potassium today Expected post operative blood loss anemia, hgb stable at 8.4.Marland Kitchen no active signs of bleeding CBGs controlled, patient is not a diabetic, will stop insulin drip transition to [place]- patient stable, wean to extubate today, leave chest tubes in place, start gentle diuresis.Marland Kitchen can hopefully wean drips today.Marland Kitchen POD #1 progression orders   LOS: 4 days    Ellwood Handler, PA-C 08/19/2022  DR Tenny Craw ADDENDUM Doing very well POD 1 Plan: Extubate Stay on epi 0.5 OK to wean levo PRN to MAP  70-90 per BP cuff. Aline appears soft.

## 2022-08-19 NOTE — Anesthesia Procedure Notes (Signed)
Central Venous Catheter Insertion Performed by: Duane Boston, MD, anesthesiologist Start/End3/27/2024 6:48 AM, 08/18/2022 6:58 AM Patient location: Pre-op. Preanesthetic checklist: patient identified, IV checked, site marked, risks and benefits discussed, surgical consent, monitors and equipment checked, pre-op evaluation, timeout performed and anesthesia consent Position: Trendelenburg Lidocaine 1% used for infiltration and patient sedated Hand hygiene performed , maximum sterile barriers used  and Seldinger technique used Catheter size: 8.5 Fr Total catheter length 8. PA cath was placed.Sheath introducer Swan type:thermodilution PA Cath depth:50 Procedure performed using ultrasound guided technique. Ultrasound Notes:anatomy identified, needle tip was noted to be adjacent to the nerve/plexus identified, no ultrasound evidence of intravascular and/or intraneural injection and image(s) printed for medical record Attempts: 1 Following insertion, line sutured and dressing applied. Post procedure assessment: free fluid flow, blood return through all ports and no air  Patient tolerated the procedure well with no immediate complications.

## 2022-08-20 ENCOUNTER — Inpatient Hospital Stay (HOSPITAL_COMMUNITY): Payer: PPO

## 2022-08-20 DIAGNOSIS — I214 Non-ST elevation (NSTEMI) myocardial infarction: Secondary | ICD-10-CM | POA: Diagnosis not present

## 2022-08-20 LAB — BASIC METABOLIC PANEL
Anion gap: 6 (ref 5–15)
BUN: 9 mg/dL (ref 8–23)
CO2: 24 mmol/L (ref 22–32)
Calcium: 7.6 mg/dL — ABNORMAL LOW (ref 8.9–10.3)
Chloride: 104 mmol/L (ref 98–111)
Creatinine, Ser: 0.98 mg/dL (ref 0.61–1.24)
GFR, Estimated: >60 mL/min (ref >60)
Glucose, Bld: 126 mg/dL — ABNORMAL HIGH (ref 70–99)
Potassium: 3.5 mmol/L (ref 3.5–5.1)
Sodium: 134 mmol/L — ABNORMAL LOW (ref 135–145)

## 2022-08-20 LAB — CBC
HCT: 21.7 % — ABNORMAL LOW (ref 39.0–52.0)
Hemoglobin: 7.5 g/dL — ABNORMAL LOW (ref 13.0–17.0)
MCH: 34.2 pg — ABNORMAL HIGH (ref 26.0–34.0)
MCHC: 34.6 g/dL (ref 30.0–36.0)
MCV: 99.1 fL (ref 80.0–100.0)
Platelets: 97 10*3/uL — ABNORMAL LOW (ref 150–400)
RBC: 2.19 MIL/uL — ABNORMAL LOW (ref 4.22–5.81)
RDW: 14.2 % (ref 11.5–15.5)
WBC: 8.9 10*3/uL (ref 4.0–10.5)
nRBC: 0 % (ref 0.0–0.2)

## 2022-08-20 LAB — GLUCOSE, CAPILLARY
Glucose-Capillary: 110 mg/dL — ABNORMAL HIGH (ref 70–99)
Glucose-Capillary: 141 mg/dL — ABNORMAL HIGH (ref 70–99)
Glucose-Capillary: 128 mg/dL — ABNORMAL HIGH (ref 70–99)
Glucose-Capillary: 109 mg/dL — ABNORMAL HIGH (ref 70–99)
Glucose-Capillary: 143 mg/dL — ABNORMAL HIGH (ref 70–99)

## 2022-08-20 MED ORDER — FUROSEMIDE 10 MG/ML IJ SOLN
20.0000 mg | Freq: Once | INTRAMUSCULAR | Status: AC
  Administered 2022-08-20: 20 mg via INTRAVENOUS
  Filled 2022-08-20: qty 2

## 2022-08-20 MED ORDER — AMIODARONE LOAD VIA INFUSION
150.0000 mg | Freq: Once | INTRAVENOUS | Status: AC
  Administered 2022-08-20: 150 mg via INTRAVENOUS
  Filled 2022-08-20: qty 83.34

## 2022-08-20 MED ORDER — POTASSIUM CHLORIDE 10 MEQ/50ML IV SOLN: 10.0000 meq | INTRAVENOUS | Status: DC

## 2022-08-20 MED ORDER — AMIODARONE IV BOLUS ONLY 150 MG/100ML: 150.0000 mg | Freq: Once | INTRAVENOUS | Status: DC

## 2022-08-20 MED ORDER — POTASSIUM CHLORIDE 10 MEQ/50ML IV SOLN
10.0000 meq | INTRAVENOUS | Status: AC
  Administered 2022-08-20 (×4): 10 meq via INTRAVENOUS
  Filled 2022-08-20 (×4): qty 50

## 2022-08-20 MED ORDER — FUROSEMIDE 10 MG/ML IJ SOLN
20.0000 mg | Freq: Every day | INTRAMUSCULAR | Status: DC
  Administered 2022-08-21: 20 mg via INTRAVENOUS
  Filled 2022-08-20: qty 2

## 2022-08-20 MED ORDER — ALBUMIN HUMAN 5 % IV SOLN
12.5000 g | Freq: Once | INTRAVENOUS | Status: AC
  Administered 2022-08-20: 12.5 g via INTRAVENOUS
  Filled 2022-08-20: qty 250

## 2022-08-20 NOTE — Progress Notes (Signed)
Switch from 60 mg/hr to 30 mg/hr per order. Infusion verify did not adjust with stop time of 0350. Amiodarone 30 mg/hr started at 0351 per [facility].

## 2022-08-20 NOTE — Progress Notes (Addendum)
[address]       [place] [address]             [phone]      2 Days Post-Op Procedure(s) (LRB): CORONARY ARTERY BYPASS GRAFTING (CABG) X2 USING LEFT INTERNAL MAMMARY ARTERY AND LEFT ENDOSCOPICALLY HARVESTED GREATER SAPHENOUS VEIN. (N/A) AORTIC VALVE REPLACEMENT (AVR) USING EDWARDS INSPIRIS AORTIC VALVE SIZE 23MM (N/A) MAZE USING ATRICURE ISOLATOR OLL2 (N/A) TRANSESOPHAGEAL ECHOCARDIOGRAM (N/A) CLIPPING OF ATRIAL APPENDAGE USING ATRICURE 45 ATRICLIP (N/A)  Subjective:  Patient sitting up in chair.  Not eating much as he isn't hungry.  He denies issues with swallowing.  No passing gas, no BM yet  Objective: Vital signs in last 24 hours: Temp:  [97.6 F (36.4 C)-100.4 F (38 C)] 98.5 F (36.9 C) (03/29 0645) Pulse Rate:  [76-132] 93 (03/29 0700) Cardiac Rhythm: Atrial fibrillation (03/29 0615) Resp:  [11-48] 15 (03/29 0700) BP: (73-135)/(50-109) 93/69 (03/29 0700) SpO2:  [86 %-100 %] 98 % (03/29 0700) Arterial Line BP: (-21-140)/(-32-71) 72/49 (03/28 1030) FiO2 (%):  [36 %-40 %] 36 % (03/28 0905) Weight:  [72 kg] 72 kg (03/29 0500)  Hemodynamic parameters for last 24 hours: PAP: (13-22)/(7-13) 15/8 CO:  [4.1 L/min] 4.1 L/min CI:  [2.3 L/min/m2] 2.3 L/min/m2  Intake/Output from previous day: 03/28 0701 - 03/29 0700 In: 1207.4 [I.V.:726.8; IV Piggyback:480.6] Out: 2455 [Urine:2065; Drains:50; Chest Tube:340]  General appearance: alert, cooperative, and no distress Heart: irregularly irregular rhythm Lungs: clear to auscultation bilaterally Abdomen: soft, non-tender; bowel sounds normal; no masses,  no organomegaly Extremities: edema + mild pitting Wound: clean and dry, ecchymosis  Lab Results: Recent Labs    08/19/22 1805 08/20/22 0352  WBC 8.5 8.9  HGB 7.9* 7.5*  HCT 23.9* 21.7*  PLT 98* 97*   BMET:  Recent Labs    08/19/22 1805 08/20/22 0352  NA 136 134*  K 3.9 3.5  CL 104 104  CO2 23 24  GLUCOSE 168* 126*  BUN 9 9   CREATININE 1.16 0.98  CALCIUM 7.5* 7.6*    PT/INR:  Recent Labs    08/18/22 1515  LABPROT 19.8*  INR 1.7*   ABG    Component Value Date/Time   PHART 7.425 08/19/2022 1019   HCO3 22.6 08/19/2022 1019   TCO2 24 08/19/2022 1019   ACIDBASEDEF 1.0 08/19/2022 1019   O2SAT 98 08/19/2022 1019   CBG (last 3)  Recent Labs    08/19/22 1933 08/19/22 2328 08/20/22 0346  GLUCAP 132* 125* 110*    Assessment/Plan: S/P Procedure(s) (LRB): CORONARY ARTERY BYPASS GRAFTING (CABG) X2 USING LEFT INTERNAL MAMMARY ARTERY AND LEFT ENDOSCOPICALLY HARVESTED GREATER SAPHENOUS VEIN. (N/A) AORTIC VALVE REPLACEMENT (AVR) USING EDWARDS INSPIRIS AORTIC VALVE SIZE 23MM (N/A) MAZE USING ATRICURE ISOLATOR OLL2 (N/A) TRANSESOPHAGEAL ECHOCARDIOGRAM (N/A) CLIPPING OF ATRIAL APPENDAGE USING ATRICURE 45 ATRICLIP (N/A)  CV- Atrial Fibrillation currently on IV Amiodarone, rates remains elevated in the 120s- will re-bolus this morning, continue Lopressor.. ? Stop Epi? Continue to wean Levophed Pulm- CT output is low, CXR w/o pneumothorax.. will d/c chest tubes today.. leave JP drain to bulb in place until after EPW can be removed Renal- creatinine is at 0.98, good response to IV lasix, will repeat... K is at 3.5 ideally should be closer to 4.0 with A. Fib.Marland Kitchen will give 40 meq IV Expected post operative blood loss anemia, moderate Hgb at 7.5 Expected post operative thrombocytopenia- stable, monitor CBGs- controlled continue SSIP for now Dispo- patient with A. Fib, will  rebolus, supplement K.. ? Stop Epi? Wean levo as hemodynamics allow, will d/c chest tubes today.... leave JP to bulb suction place until EPW can be safely removed, keep in ICU today   LOS: 5 days    Ellwood Handler, PA-C 08/20/2022  DR Tenny Craw ADDENDUM AF overnight and this AM Bolus x 2 - gtt back to 5 Looks very well today   Plan:  EPI off, keep levo on for MAP 70-90 Start DVT PPX DC foley now DC chest tubes now, keep wires and bulb in  place Tramadol for pain, if he gets loopy then tylenol alone for pain 20 lasix this AM, as daily then.  Stay in ICU today

## 2022-08-20 NOTE — Progress Notes (Addendum)
NAME:  Jeremy Waters, MRN:  [id number], DOB:  [birth year], LOS: 5 ADMISSION DATE:  08/15/2022 CONSULTATION DATE:  08/18/2022 REFERRING MD:  Tenny Craw - TCTS CHIEF COMPLAINT:  S/p CABG, AVR, MAZE  History of Present Illness:  Jeremy Waters is seen in consultation at the request of Dr. Tenny Craw (TCTS) for management of mechanical ventilation and hemodynamics post-CABG/AVR/MAZE.  77 year old man who presented to [facility] 3/24 after his Apple Watch alerted him of possible AFib. Reported several weeks of intermittent L arm/shoulder pain lasting ~1 hour. PMHx significant for CAD, mild AS, PVCs (followed by Cardiology - Dr. Harl Bowie), HLD, hypothyroidism, prostate CA, former tobacco use.   In ER, noted to be in Afib with RVR (rates up to 130s). BNP and troponins were elevated with concern for NSTEMI. ASA/heparin gtt were started and Cardiology consulted. Echo 3/25 demonstrated EF 50-55%, low normal LV function, mildly reduced RV function with dilated LA/RA and findings concerning for low-flow low-gradient AS. LHC was completed 3/25 demonstrating 3-vessel CAD (RCA, LAD, LCx). TCTS consult was recommended and patient subsequently underwent AVR, CABG x 2 (LIMA-LAD, SVG-RCA) and MAZE procedure/Atriclip with Dr. Tenny Craw 3/27.  Pertinent Medical History:   Past Medical History:  Diagnosis Date   Adenomatous colon polyp 09/2007   Arthritis    OA / PAIN LEFT KNEE   Cancer ([place])    prostate cancer   Cataract    removed both eyes   COVID-19 virus infection    Diverticulosis of colon    w/o hemorrage   Heart murmur    TOLD HE HAS A SLIGHT MURMUR   Hyperlipidemia    Inguinal hernia    left side    Thyroid disease    Significant Hospital Events: Including procedures, antibiotic start and stop dates in addition to other pertinent events   3/24 - Admit for Afib/RVR and NSTEMI. 3/25 - Echo EF 50-55%, low normal LV function, mildly reduced RV function with dilated LA/RA and findings concerning for low-flow low-gradient  AS., LHC with 3-vessel disease. TCTS consulted. 3/27 - POD#0 AVR, CABG x 2, MAZE, AtriClip (Enter). CCM consulted for MV/hemodynamics.  Interim History / Subjective:  Remains on epi/levophed. In afib/RVR Pulling 800 on IS  Objective:  Blood pressure 93/69, pulse 93, temperature 98.5 F (36.9 C), temperature source Oral, resp. rate 15, height 5\' 7"  (1.702 m), weight 72 kg, SpO2 98 %. PAP: (13-22)/(7-13) 15/8 CO:  [4.1 L/min] 4.1 L/min CI:  [2.3 L/min/m2] 2.3 L/min/m2  Vent Mode: PSV;CPAP FiO2 (%):  [36 %-40 %] 36 % Set Rate:  [4 bmp] 4 bmp Vt Set:  [520 mL] 520 mL PEEP:  [5 cmH20] 5 cmH20 Pressure Support:  [10 cmH20] 10 cmH20   Intake/Output Summary (Last 24 hours) at 08/20/2022 0730 Last data filed at 08/20/2022 0600 Gross per 24 hour  Intake 1207.44 ml  Output 2455 ml  Net -1247.56 ml    Filed Weights   08/16/22 1135 08/19/22 0500 08/20/22 0500  Weight: 65.7 kg 74 kg 72 kg   Physical Examination: No distress in chair RIJ introducer in place Sternotomy with minimal strikethrough Ext warm Lungs are mostly clear, good cough Moves ext to command Heart sounds irregular, tachy  H/h and plts stably low  Resolved Hospital Problem List:  Hypokalemia hypomagnesemia  Assessment & Plan:   S/p AVR 3/27 NSTEMI, CAD, S/p 2-vessel CABG, LIMA-LAD and SVG-RCA 3/27 - GDMT per TCTS - Hopefully wean off inotropic and pressor agents today - complete post-op antibiotics - pain control per protocol -  progressive mobility - encourage IS  Paroxysmal atrial fibrillation, S/p MAZE 3/27 and AtriClip Afib with RVR New Afib with RVR noted on ED presentation 3/24. CHA2DS2-VASc score 3 on admission. - monitor electrolytes, replete as needed: K being replaced - tele monitoring - eventually needs AC when stable from surgical standpoint.  Hypophosphatemia -repleted  Hypothyroidism - Con't PTA levothyroxine  ABLA, expected post-op Consumptive thrombocytopenia, expected  post-op -transfuse for Hb <7 or hemodynamically significant bleeding -con't to monitor  Postoperative shock- wean levophed for MAP 65, inotropes per TCTS  Wife updated at bedside  Erskine Emery MD PCCM

## 2022-08-21 ENCOUNTER — Inpatient Hospital Stay (HOSPITAL_COMMUNITY): Payer: PPO

## 2022-08-21 DIAGNOSIS — I214 Non-ST elevation (NSTEMI) myocardial infarction: Secondary | ICD-10-CM | POA: Diagnosis not present

## 2022-08-21 LAB — BASIC METABOLIC PANEL
Anion gap: 8 (ref 5–15)
BUN: 17 mg/dL (ref 8–23)
CO2: 25 mmol/L (ref 22–32)
Calcium: 8.1 mg/dL — ABNORMAL LOW (ref 8.9–10.3)
Chloride: 103 mmol/L (ref 98–111)
Creatinine, Ser: 1 mg/dL (ref 0.61–1.24)
GFR, Estimated: >60 mL/min (ref >60)
Glucose, Bld: 137 mg/dL — ABNORMAL HIGH (ref 70–99)
Potassium: 3.6 mmol/L (ref 3.5–5.1)
Sodium: 136 mmol/L (ref 135–145)

## 2022-08-21 LAB — GLUCOSE, CAPILLARY
Glucose-Capillary: 105 mg/dL — ABNORMAL HIGH (ref 70–99)
Glucose-Capillary: 107 mg/dL — ABNORMAL HIGH (ref 70–99)
Glucose-Capillary: 116 mg/dL — ABNORMAL HIGH (ref 70–99)
Glucose-Capillary: 131 mg/dL — ABNORMAL HIGH (ref 70–99)
Glucose-Capillary: 133 mg/dL — ABNORMAL HIGH (ref 70–99)
Glucose-Capillary: 140 mg/dL — ABNORMAL HIGH (ref 70–99)
Glucose-Capillary: 153 mg/dL — ABNORMAL HIGH (ref 70–99)

## 2022-08-21 LAB — CBC
HCT: 27.7 % — ABNORMAL LOW (ref 39.0–52.0)
Hemoglobin: 9.2 g/dL — ABNORMAL LOW (ref 13.0–17.0)
MCH: 33.5 pg (ref 26.0–34.0)
MCHC: 33.2 g/dL (ref 30.0–36.0)
MCV: 100.7 fL — ABNORMAL HIGH (ref 80.0–100.0)
Platelets: 108 10*3/uL — ABNORMAL LOW (ref 150–400)
RBC: 2.75 MIL/uL — ABNORMAL LOW (ref 4.22–5.81)
RDW: 14.6 % (ref 11.5–15.5)
WBC: 7.7 10*3/uL (ref 4.0–10.5)
nRBC: 0 % (ref 0.0–0.2)

## 2022-08-21 LAB — MAGNESIUM: Magnesium: 2.2 mg/dL (ref 1.7–2.4)

## 2022-08-21 LAB — PHOSPHORUS: Phosphorus: 1.7 mg/dL — ABNORMAL LOW (ref 2.5–4.6)

## 2022-08-21 MED ORDER — POTASSIUM PHOSPHATES 15 MMOLE/5ML IV SOLN
30.0000 mmol | Freq: Once | INTRAVENOUS | Status: AC
  Administered 2022-08-21: 30 mmol via INTRAVENOUS
  Filled 2022-08-21: qty 10

## 2022-08-21 MED ORDER — MIDODRINE HCL 5 MG PO TABS
5.0000 mg | ORAL_TABLET | Freq: Three times a day (TID) | ORAL | Status: DC
  Administered 2022-08-21 (×3): 5 mg via ORAL
  Filled 2022-08-21 (×3): qty 1

## 2022-08-21 MED ORDER — AMIODARONE HCL 200 MG PO TABS
200.0000 mg | ORAL_TABLET | Freq: Two times a day (BID) | ORAL | Status: DC
  Administered 2022-08-21 – 2022-08-22 (×4): 200 mg via ORAL
  Filled 2022-08-21 (×4): qty 1

## 2022-08-21 MED ORDER — POTASSIUM CHLORIDE CRYS ER 20 MEQ PO TBCR: 20.0000 meq | EXTENDED_RELEASE_TABLET | ORAL | Status: DC

## 2022-08-21 NOTE — Progress Notes (Signed)
NAME:  Jeremy Waters, MRN:  [id number], DOB:  June 13, 1945, LOS: 6 ADMISSION DATE:  08/15/2022 CONSULTATION DATE:  08/18/2022 REFERRING MD:  Jeremy Waters - TCTS CHIEF COMPLAINT:  S/p CABG, AVR, MAZE  History of Present Illness:  [name] is seen in consultation at the request of Dr. Tenny Waters (TCTS) for management of mechanical ventilation and hemodynamics post-CABG/AVR/MAZE.  77 year old man who presented to [facility] 3/24 after his Apple Watch alerted him of possible AFib. Reported several weeks of intermittent L arm/shoulder pain lasting ~1 hour. PMHx significant for CAD, mild AS, PVCs (followed by Cardiology - Dr. Harl Waters), HLD, hypothyroidism, prostate CA, former tobacco use.   In ER, noted to be in Afib with RVR (rates up to 130s). BNP and troponins were elevated with concern for NSTEMI. ASA/heparin gtt were started and Cardiology consulted. Echo 3/25 demonstrated EF 50-55%, low normal LV function, mildly reduced RV function with dilated LA/RA and findings concerning for low-flow low-gradient AS. LHC was completed 3/25 demonstrating 3-vessel CAD (RCA, LAD, LCx). TCTS consult was recommended and patient subsequently underwent AVR, CABG x 2 (LIMA-LAD, SVG-RCA) and MAZE procedure/Atriclip with Dr. Tenny Waters 3/27.  Pertinent Medical History:   Past Medical History:  Diagnosis Date   Adenomatous colon polyp 09/2007   Arthritis    OA / PAIN LEFT KNEE   Cancer ([place])    prostate cancer   Cataract    removed both eyes   COVID-19 virus infection    Diverticulosis of colon    w/o hemorrage   Heart murmur    TOLD HE HAS A SLIGHT MURMUR   Hyperlipidemia    Inguinal hernia    left side    Thyroid disease    Significant Hospital Events: Including procedures, antibiotic start and stop dates in addition to other pertinent events   3/24 - Admit for Afib/RVR and NSTEMI. 3/25 - Echo EF 50-55%, low normal LV function, mildly reduced RV function with dilated LA/RA and findings concerning for low-flow low-gradient  AS., LHC with 3-vessel disease. TCTS consulted. 3/27 - POD#0 AVR, CABG x 2, MAZE, AtriClip (Enter). CCM consulted for MV/hemodynamics.  Interim History / Subjective:  Levo at 5. Remains in Afib. IS to 1250 Pain under control Wife at bedside.  Objective:  Blood pressure 115/85, pulse (!) 107, temperature 98 F (36.7 C), temperature source Oral, resp. rate (!) 21, height 5\' 7"  (1.702 m), weight 73.7 kg, SpO2 97 %.        Intake/Output Summary (Last 24 hours) at 08/21/2022 0716 Last data filed at 08/21/2022 0700 Gross per 24 hour  Intake 2191.57 ml  Output 795 ml  Net 1396.57 ml    Filed Weights   08/19/22 0500 08/20/22 0500 08/21/22 0500  Weight: 74 kg 72 kg 73.7 kg   Physical Examination: No distress sitting in chair Sternotomy scar looks good Heart sounds irregular, ext warm Lungs clear Moves ext to command RASS 0 Ext warm, no edema  CXR L hemidiaphragm elevation and associated effusion stable  Plts better, hgb up not sure I believe it K/Phos being replted  Resolved Hospital Problem List:  Hypokalemia hypomagnesemia  Assessment & Plan:   S/p AVR 3/27 NSTEMI, CAD, S/p 2-vessel CABG, LIMA-LAD and SVG-RCA 3/27 Post pump vasoplegia - GDMT per TCTS - Wean levophed for MAP 65, midodrine started - complete post-op antibiotics - pain control per protocol - progressive mobility - encourage IS  Paroxysmal atrial fibrillation, S/p MAZE 3/27 and AtriClip Afib with RVR New Afib with RVR noted on ED presentation  3/24. CHA2DS2-VASc score 3 on admission. - monitor electrolytes, replete as needed: K/Phos being replaced - tele monitoring - eventually needs AC when stable from surgical standpoint.  Hypophosphatemia -repleted  Hypothyroidism - Con't PTA levothyroxine  ABLA, expected post-op Consumptive thrombocytopenia, expected post-op -transfuse for Hb <7 or hemodynamically significant bleeding -con't to monitor   Wife updated at bedside  31 min cc  time  Jeremy Emery MD PCCM

## 2022-08-21 NOTE — Progress Notes (Signed)
TCTS Progress Note: 3 Days Post-Op Procedure(s) (LRB): CORONARY ARTERY BYPASS GRAFTING (CABG) X2 USING LEFT INTERNAL MAMMARY ARTERY AND LEFT ENDOSCOPICALLY HARVESTED GREATER SAPHENOUS VEIN. (N/A) AORTIC VALVE REPLACEMENT (AVR) USING EDWARDS INSPIRIS AORTIC VALVE SIZE 23MM (N/A) MAZE USING ATRICURE ISOLATOR OLL2 (N/A) TRANSESOPHAGEAL ECHOCARDIOGRAM (N/A) CLIPPING OF ATRIAL APPENDAGE USING ATRICURE 61 ATRICLIP (N/A)  LOS: 6 days    Doing well, no issues. Looks to have converted back to sinus    Latest Ref Rng & Units 08/21/2022    4:19 AM 08/20/2022    3:52 AM 08/19/2022    6:05 PM  CBC  WBC 4.0 - 10.5 K/uL 7.7  8.9  8.5   Hemoglobin 13.0 - 17.0 g/dL 9.2  7.5  7.9   Hematocrit 39.0 - 52.0 % 27.7  21.7  23.9   Platelets 150 - 400 K/uL 108  97  98        Latest Ref Rng & Units 08/21/2022    4:19 AM 08/20/2022    3:52 AM 08/19/2022    6:05 PM  CMP  Glucose 70 - 99 mg/dL 137  126  168   BUN 8 - 23 mg/dL 17  9  9    Creatinine 0.61 - 1.24 mg/dL 1.00  0.98  1.16   Sodium 135 - 145 mmol/L 136  134  136   Potassium 3.5 - 5.1 mmol/L 3.6  3.5  3.9   Chloride 98 - 111 mmol/L 103  104  104   CO2 22 - 32 mmol/L 25  24  23    Calcium 8.9 - 10.3 mg/dL 8.1  7.6  7.5     ABG    Component Value Date/Time   PHART 7.425 08/19/2022 1019   PCO2ART 34.6 08/19/2022 1019   PO2ART 101 08/19/2022 1019   HCO3 22.6 08/19/2022 1019   TCO2 24 08/19/2022 1019   ACIDBASEDEF 1.0 08/19/2022 1019   O2SAT 98 08/19/2022 1019

## 2022-08-21 NOTE — Evaluation (Signed)
Physical Therapy Evaluation Patient Details Name: Jeremy Waters MRN: [id number] DOB: [birth year] Today's Date: 08/21/2022  History of Present Illness  Pt is a 77 yo male admitted 3/24 with Afib. 3/25 LHC with 3 vessel CAD. 3/27 AVR, CABGx 2 and MAZE. PMHx: CAD, mild AS, PVCs, HLD, hypothyroidism, prostate CA, back sx, Lt TKA.  Clinical Impression  Pt pleasant and reports being active with golf, working part time with insurance and enjoys using his eliptical. Pt currently minguard-min assist for all mobility with decreased gait, strength, balance and transfers who will benefit from acute therapy to maximize mobility, safety and independence. Pt aware of sternal precautions, educated for all and min cues to maintain with mobility. Wife present during session and aware of plan. Encouraged OOB and daily mobility with nursing and will continue to work toward improved gait and stairs for D/C.   HR 101-117 111/75 post gait       Recommendations for follow up therapy are one component of a multi-disciplinary discharge planning process, led by the attending physician.  Recommendations may be updated based on patient status, additional functional criteria and insurance authorization.  Follow Up Recommendations       Assistance Recommended at Discharge Intermittent Supervision/Assistance  Patient can return home with the following  A little help with walking and/or transfers;A little help with bathing/dressing/bathroom;Assist for transportation;Assistance with cooking/housework    Equipment Recommendations Rolling walker (2 wheels);BSC/3in1  Recommendations for Other Services       Functional Status Assessment Patient has had a recent decline in their functional status and demonstrates the ability to make significant improvements in function in a reasonable and predictable amount of time.     Precautions / Restrictions Precautions Precautions: Fall;Sternal Precaution Comments: pacer, JP drain       Mobility  Bed Mobility Overal bed mobility: Needs Assistance Bed Mobility: Sit to Supine       Sit to supine: Min assist   General bed mobility comments: OOB upon entry. min assist to lift legs to surface for return to bed    Transfers Overall transfer level: Needs assistance   Transfers: Sit to/from Stand Sit to Stand: Min guard           General transfer comment: cues for hand placement and tactile cues for anterior translation with pt rising from recliner and toilet    Ambulation/Gait Ambulation/Gait assistance: Min guard Gait Distance (Feet): 180 Feet Assistive device: Rolling walker (2 wheels) Gait Pattern/deviations: Step-through pattern, Decreased stride length, Shuffle, Wide base of support   Gait velocity interpretation: 1.31 - 2.62 ft/sec, indicative of limited community ambulator   General Gait Details: pt with shuffling gait with decreased stride and increased BOS, cues for posture and proximity to [employer]    Modified Rankin (Stroke Patients Only)       Balance Overall balance assessment: Needs assistance   Sitting balance-Leahy Scale: Fair     Standing balance support: Bilateral upper extremity supported, Reliant on assistive device for balance, No upper extremity supported Standing balance-Leahy Scale: Fair Standing balance comment: RW for gait, static standing at sink                             Pertinent Vitals/Pain Pain Assessment Pain Assessment: 0-10 Pain Location: sternal incision Pain Descriptors / Indicators: Aching, Sore Pain Intervention(s): Limited activity within patient's tolerance, Repositioned, Monitored during session  Home Living Family/patient expects to be discharged to:: Private residence Living Arrangements: Spouse/significant other Available Help at Discharge: Family;Available 24 hours/day Type of Home: House Home Access: Stairs to enter Entrance  Stairs-Rails: Right Entrance Stairs-Number of Steps: 8 steps if parks in driveway, [id number] steps from garage Alternate Level Stairs-Number of Steps: 14 Home Layout: Two level;Bed/bath upstairs;Able to live on main level with bedroom/bathroom;Full bath on main level Home Equipment: None      Prior Function Prior Level of Function : Independent/Modified Independent               ADLs Comments: driving, part time life insurance work     Journalist, newspaper        Extremity/Trunk Assessment   Upper Extremity Assessment Upper Extremity Assessment: Generalized weakness    Lower Extremity Assessment Lower Extremity Assessment: Generalized weakness    Cervical / Trunk Assessment Cervical / Trunk Assessment: Kyphotic  Communication   Communication: No difficulties  Cognition Arousal/Alertness: Awake/alert Behavior During Therapy: WFL for tasks assessed/performed Overall Cognitive Status: Within Functional Limits for tasks assessed                                          General Comments      Exercises     Assessment/Plan    PT Assessment Patient needs continued PT services  PT Problem List Decreased balance;Decreased activity tolerance;Decreased knowledge of use of DME;Decreased mobility       PT Treatment Interventions Stair training;Balance training;Functional mobility training;Therapeutic exercise;Patient/family education;Gait training;Therapeutic activities;DME instruction    PT Goals (Current goals can be found in the Care Plan section)  Acute Rehab PT Goals Patient Stated Goal: return to golf PT Goal Formulation: With patient/family Time For Goal Achievement: 09/04/22 Potential to Achieve Goals: Good    Frequency Min 1X/week     Co-evaluation               AM-PAC PT "6 Clicks" Mobility  Outcome Measure Help needed turning from your back to your side while in a flat bed without using bedrails?: A Little Help needed moving from  lying on your back to sitting on the side of a flat bed without using bedrails?: A Little Help needed moving to and from a bed to a chair (including a wheelchair)?: A Little Help needed standing up from a chair using your arms (e.g., wheelchair or bedside chair)?: A Little Help needed to walk in hospital room?: A Little Help needed climbing 3-5 steps with a railing? : A Lot 6 Click Score: 17    End of Session   Activity Tolerance: Patient tolerated treatment well Patient left: in bed;with call bell/phone within reach Nurse Communication: Mobility status PT Visit Diagnosis: Other abnormalities of gait and mobility (R26.89)    Time: [id number] PT Time Calculation (min) (ACUTE ONLY): 29 min   Charges:   PT Evaluation $PT Eval Moderate Complexity: 1 Mod PT Treatments $Therapeutic Activity: 8-22 mins        Bayard Males, PT Acute Rehabilitation Services Office: [phone]   Lamarr Lulas 08/21/2022, 12:55 PM

## 2022-08-21 NOTE — Progress Notes (Addendum)
TCTS Progress Note: 3 Days Post-Op Procedure(s) (LRB): CORONARY ARTERY BYPASS GRAFTING (CABG) X2 USING LEFT INTERNAL MAMMARY ARTERY AND LEFT ENDOSCOPICALLY HARVESTED GREATER SAPHENOUS VEIN. (N/A) AORTIC VALVE REPLACEMENT (AVR) USING EDWARDS INSPIRIS AORTIC VALVE SIZE 23MM (N/A) MAZE USING ATRICURE ISOLATOR OLL2 (N/A) TRANSESOPHAGEAL ECHOCARDIOGRAM (N/A) CLIPPING OF ATRIAL APPENDAGE USING ATRICURE 56 ATRICLIP (N/A)  LOS: 6 days   Sitting up in chair HR AF 110s, stayhed same 110s when walking Wants to progress to full diet Has had Bms On 5 of levo  Meds:  ASA: yes DVT ppx: yes BB: yes  Lasix: yes 20 daily  Plan N no issues CV AF - is on gtt, start po as well. 200 po bid as on gtt. On ASA for CAB / AVR On 5 levo, start midodrine 5 tid Resp: no issues GI: diet, had BM GU: 725 on Heme: labs good  ID WC nl Wires - keep in for now. When he goes back in sinus may be low as he was 40s going into OR.        Latest Ref Rng & Units 08/21/2022    4:19 AM 08/20/2022    3:52 AM 08/19/2022    6:05 PM  CBC  WBC 4.0 - 10.5 K/uL 7.7  8.9  8.5   Hemoglobin 13.0 - 17.0 g/dL 9.2  7.5  7.9   Hematocrit 39.0 - 52.0 % 27.7  21.7  23.9   Platelets 150 - 400 K/uL 108  97  98        Latest Ref Rng & Units 08/21/2022    4:19 AM 08/20/2022    3:52 AM 08/19/2022    6:05 PM  CMP  Glucose 70 - 99 mg/dL 137  126  168   BUN 8 - 23 mg/dL 17  9  9    Creatinine 0.61 - 1.24 mg/dL 1.00  0.98  1.16   Sodium 135 - 145 mmol/L 136  134  136   Potassium 3.5 - 5.1 mmol/L 3.6  3.5  3.9   Chloride 98 - 111 mmol/L 103  104  104   CO2 22 - 32 mmol/L 25  24  23    Calcium 8.9 - 10.3 mg/dL 8.1  7.6  7.5     ABG    Component Value Date/Time   PHART 7.425 08/19/2022 1019   PCO2ART 34.6 08/19/2022 1019   PO2ART 101 08/19/2022 1019   HCO3 22.6 08/19/2022 1019   TCO2 24 08/19/2022 1019   ACIDBASEDEF 1.0 08/19/2022 1019   O2SAT 98 08/19/2022 1019

## 2022-08-21 NOTE — Evaluation (Signed)
Occupational Therapy Evaluation Patient Details Name: Jeremy Waters MRN: [id number] DOB: [birth year] Today's Date: 08/21/2022   History of Present Illness Pt is a 77 yo male admitted 3/24 with Afib. 3/25 LHC with 3 vessel CAD. 3/27 AVR, CABGx 2 and MAZE. PMHx: CAD, mild AS, PVCs, HLD, hypothyroidism, prostate CA, back sx, Lt TKA.   Clinical Impression   PTA patient independent and driving, working part time.  Admitted for above and presents with problem list below. He was educated on sternal precautions, safety, recommendations, DME and energy conservation.  He completes transfers with min assist, ADLs with setup to min assist. Pt tachy during session with RN aware. He has good support from spouse at dc.  Believe he will best benefit from continued OT services acutely but anticipate he will progress well with no further needs required after dc.       Recommendations for follow up therapy are one component of a multi-disciplinary discharge planning process, led by the attending physician.  Recommendations may be updated based on patient status, additional functional criteria and insurance authorization.   Assistance Recommended at Discharge Intermittent Supervision/Assistance  Patient can return home with the following A little help with walking and/or transfers;A little help with bathing/dressing/bathroom;Assistance with cooking/housework;Assist for transportation;Help with stairs or ramp for entrance    Functional Status Assessment  Patient has had a recent decline in their functional status and demonstrates the ability to make significant improvements in function in a reasonable and predictable amount of time.  Equipment Recommendations  Tub/shower seat    Recommendations for Other Services       Precautions / Restrictions Precautions Precautions: Fall;Sternal Precaution Booklet Issued: Yes (comment) Precaution Comments: pacer, JP drain Restrictions Weight Bearing Restrictions:  Yes RUE Weight Bearing: Non weight bearing LUE Weight Bearing: Non weight bearing Other Position/Activity Restrictions: sternal      Mobility Bed Mobility               General bed mobility comments: OOB upon entry    Transfers Overall transfer level: Needs assistance   Transfers: Sit to/from Stand Sit to Stand: Min assist           General transfer comment: cueing for hand on knees, use of momentum with min assist to fully power up from recliner before bringing hands to RW      Balance Overall balance assessment: Needs assistance Sitting-balance support: No upper extremity supported, Feet supported Sitting balance-Leahy Scale: Fair     Standing balance support: No upper extremity supported, Bilateral upper extremity supported, During functional activity Standing balance-Leahy Scale: Fair Standing balance comment: preference to UE support in standing but able to stand during ADLs without UE support briefly and min guard                           ADL either performed or assessed with clinical judgement   ADL Overall ADL's : Needs assistance/impaired     Grooming: Set up;Sitting           Upper Body Dressing : Minimal assistance;Sitting   Lower Body Dressing: Minimal assistance;Sit to/from stand Lower Body Dressing Details (indicate cue type and reason): able to figure 4 for socks, min assist to stand Toilet Transfer: Minimal assistance;Rolling walker (2 wheels) Toilet Transfer Details (indicate cue type and reason): simulated in room         Functional mobility during ADLs: Minimal assistance;Rolling walker (2 wheels)       Vision  Vision Assessment?: No apparent visual deficits     Perception     Praxis      Pertinent Vitals/Pain Pain Assessment Pain Assessment: No/denies pain     Hand Dominance Right   Extremity/Trunk Assessment Upper Extremity Assessment Upper Extremity Assessment: Generalized weakness (within sternal  precautions)   Lower Extremity Assessment Lower Extremity Assessment: Defer to PT evaluation       Communication Communication Communication: No difficulties   Cognition Arousal/Alertness: Awake/alert Behavior During Therapy: WFL for tasks assessed/performed Overall Cognitive Status: Within Functional Limits for tasks assessed                                       General Comments  HR low 100-110s during session, on `1L [place]; spouse at side and supportive    Exercises     Shoulder Instructions      Home Living Family/patient expects to be discharged to:: Private residence Living Arrangements: Spouse/significant other Available Help at Discharge: Family;Available 24 hours/day Type of Home: House Home Access: Stairs to enter [employer] of Steps: 8 steps if parks in driveway, [id number] steps from garage Entrance Stairs-Rails: Right Home Layout: Two level;Bed/bath upstairs;Able to live on main level with bedroom/bathroom;Full bath on main level Alternate Level Stairs-Number of Steps: 14 Alternate Level Stairs-Rails: Right Bathroom Shower/Tub: Tub/shower unit;Walk-in shower (tub shower on 1st floor)   Bathroom Toilet: Standard (comfort height)     Home Equipment: None          Prior Functioning/Environment Prior Level of Function : Independent/Modified Independent               ADLs Comments: driving, part time life insurance work        OT Problem List: Decreased strength;Decreased activity tolerance;Impaired balance (sitting and/or standing);Decreased safety awareness;Decreased knowledge of use of DME or AE;Decreased knowledge of precautions;Cardiopulmonary status limiting activity      OT Treatment/Interventions: Self-care/ADL training;Therapeutic exercise;DME and/or AE instruction;Therapeutic activities;Patient/family education;Balance training;Energy conservation    OT Goals(Current goals can be found in the care plan section) Acute  Rehab OT Goals Patient Stated Goal: home OT Goal Formulation: With patient Time For Goal Achievement: 09/04/22 Potential to Achieve Goals: Good  OT Frequency: Min 2X/week    Co-evaluation              AM-PAC OT "6 Clicks" Daily Activity     Outcome Measure Help from another person eating meals?: A Little Help from another person taking care of personal grooming?: A Little Help from another person toileting, which includes using toliet, bedpan, or urinal?: A Lot Help from another person bathing (including washing, rinsing, drying)?: A Little Help from another person to put on and taking off regular upper body clothing?: A Little Help from another person to put on and taking off regular lower body clothing?: A Little 6 Click Score: 17   End of Session Equipment Utilized During Treatment: Oxygen;Rolling walker (2 wheels) Nurse Communication: Mobility status;Precautions  Activity Tolerance: Patient tolerated treatment well Patient left: in chair;with call bell/phone within reach;with family/visitor present  OT Visit Diagnosis: Other abnormalities of gait and mobility (R26.89);Muscle weakness (generalized) (M62.81)                Time: [id number] OT Time Calculation (min): 27 min Charges:  OT General Charges $OT Visit: 1 Visit OT Evaluation $OT Eval Moderate Complexity: 1 Mod OT Treatments $Self Care/Home Management : 8-22 mins  Jolaine Artist, OT Acute Rehabilitation Services Office [phone]   [name] 08/21/2022, 9:31 AM

## 2022-08-21 NOTE — Progress Notes (Signed)
[place] Progress Note Patient Name: Jeremy Waters DOB: 1945-07-21 MRN: [id number]   Date of Service  08/21/2022  HPI/Events of Note  K+ 3.6, Phos 1.7, Cr 1.00  eICU Interventions  K+ Phos 30 mmol iv x 1.        [name] [name] 08/21/2022, 5:22 AM

## 2022-08-22 DIAGNOSIS — I214 Non-ST elevation (NSTEMI) myocardial infarction: Secondary | ICD-10-CM | POA: Diagnosis not present

## 2022-08-22 LAB — CBC
HCT: 20.2 % — ABNORMAL LOW (ref 39.0–52.0)
Hemoglobin: 7 g/dL — ABNORMAL LOW (ref 13.0–17.0)
MCH: 34.1 pg — ABNORMAL HIGH (ref 26.0–34.0)
MCHC: 34.7 g/dL (ref 30.0–36.0)
MCV: 98.5 fL (ref 80.0–100.0)
Platelets: 151 10*3/uL (ref 150–400)
RBC: 2.05 MIL/uL — ABNORMAL LOW (ref 4.22–5.81)
RDW: 14.6 % (ref 11.5–15.5)
WBC: 7.5 10*3/uL (ref 4.0–10.5)
nRBC: 0.3 % — ABNORMAL HIGH (ref 0.0–0.2)

## 2022-08-22 LAB — BASIC METABOLIC PANEL
Anion gap: 8 (ref 5–15)
BUN: 15 mg/dL (ref 8–23)
CO2: 24 mmol/L (ref 22–32)
Calcium: 7.8 mg/dL — ABNORMAL LOW (ref 8.9–10.3)
Chloride: 101 mmol/L (ref 98–111)
Creatinine, Ser: 1.19 mg/dL (ref 0.61–1.24)
GFR, Estimated: >60 mL/min (ref >60)
Glucose, Bld: 108 mg/dL — ABNORMAL HIGH (ref 70–99)
Potassium: 3.3 mmol/L — ABNORMAL LOW (ref 3.5–5.1)
Sodium: 133 mmol/L — ABNORMAL LOW (ref 135–145)

## 2022-08-22 LAB — GLUCOSE, CAPILLARY
Glucose-Capillary: 116 mg/dL — ABNORMAL HIGH (ref 70–99)
Glucose-Capillary: 106 mg/dL — ABNORMAL HIGH (ref 70–99)
Glucose-Capillary: 102 mg/dL — ABNORMAL HIGH (ref 70–99)
Glucose-Capillary: 128 mg/dL — ABNORMAL HIGH (ref 70–99)
Glucose-Capillary: 124 mg/dL — ABNORMAL HIGH (ref 70–99)
Glucose-Capillary: 241 mg/dL — ABNORMAL HIGH (ref 70–99)
Glucose-Capillary: 87 mg/dL (ref 70–99)

## 2022-08-22 LAB — HEMOGLOBIN AND HEMATOCRIT, BLOOD
HCT: 27.5 % — ABNORMAL LOW (ref 39.0–52.0)
Hemoglobin: 9 g/dL — ABNORMAL LOW (ref 13.0–17.0)

## 2022-08-22 LAB — PREPARE RBC (CROSSMATCH)

## 2022-08-22 LAB — MAGNESIUM: Magnesium: 2.2 mg/dL (ref 1.7–2.4)

## 2022-08-22 LAB — PHOSPHORUS: Phosphorus: 1.8 mg/dL — ABNORMAL LOW (ref 2.5–4.6)

## 2022-08-22 MED ORDER — SODIUM CHLORIDE 0.9% IV SOLUTION: Freq: Once | INTRAVENOUS | Status: AC

## 2022-08-22 MED ORDER — POTASSIUM CHLORIDE CRYS ER 20 MEQ PO TBCR
40.0000 meq | EXTENDED_RELEASE_TABLET | Freq: Once | ORAL | Status: AC
  Administered 2022-08-22: 40 meq via ORAL
  Filled 2022-08-22: qty 2

## 2022-08-22 MED ORDER — MIDODRINE HCL 5 MG PO TABS
10.0000 mg | ORAL_TABLET | Freq: Three times a day (TID) | ORAL | Status: DC
  Administered 2022-08-22 – 2022-08-26 (×13): 10 mg via ORAL
  Filled 2022-08-22 (×13): qty 2

## 2022-08-22 MED ORDER — POTASSIUM CHLORIDE CRYS ER 20 MEQ PO TBCR
20.0000 meq | EXTENDED_RELEASE_TABLET | ORAL | Status: DC
  Administered 2022-08-22: 20 meq via ORAL
  Filled 2022-08-22: qty 1

## 2022-08-22 MED ORDER — INSULIN ASPART 100 UNIT/ML IJ SOLN: 0.0000 [IU] | INTRAMUSCULAR | Status: DC

## 2022-08-22 MED ORDER — FUROSEMIDE 10 MG/ML IJ SOLN
40.0000 mg | Freq: Two times a day (BID) | INTRAMUSCULAR | Status: DC
  Administered 2022-08-22: 40 mg via INTRAVENOUS
  Filled 2022-08-22: qty 4

## 2022-08-22 MED ORDER — DIPHENHYDRAMINE HCL 25 MG PO CAPS
25.0000 mg | ORAL_CAPSULE | Freq: Once | ORAL | Status: AC
  Administered 2022-08-22: 25 mg via ORAL
  Filled 2022-08-22: qty 1

## 2022-08-22 MED ORDER — POTASSIUM PHOSPHATES 15 MMOLE/5ML IV SOLN
30.0000 mmol | Freq: Once | INTRAVENOUS | Status: AC
  Administered 2022-08-22: 30 mmol via INTRAVENOUS
  Filled 2022-08-22: qty 10

## 2022-08-22 NOTE — Progress Notes (Signed)
NAME:  NYRON CHMURA, MRN:  [id number], DOB:  [birth year], LOS: 7 ADMISSION DATE:  08/15/2022 CONSULTATION DATE:  08/18/2022 REFERRING MD:  Tenny Craw - TCTS CHIEF COMPLAINT:  S/p CABG, AVR, MAZE  History of Present Illness:  Mr. Segovia is seen in consultation at the request of Dr. Tenny Craw (TCTS) for management of mechanical ventilation and hemodynamics post-CABG/AVR/MAZE.  77 year old man who presented to [facility] 3/24 after his Apple Watch alerted him of possible AFib. Reported several weeks of intermittent L arm/shoulder pain lasting ~1 hour. PMHx significant for CAD, mild AS, PVCs (followed by Cardiology - Dr. Harl Bowie), HLD, hypothyroidism, prostate CA, former tobacco use.   In ER, noted to be in Afib with RVR (rates up to 130s). BNP and troponins were elevated with concern for NSTEMI. ASA/heparin gtt were started and Cardiology consulted. Echo 3/25 demonstrated EF 50-55%, low normal LV function, mildly reduced RV function with dilated LA/RA and findings concerning for low-flow low-gradient AS. LHC was completed 3/25 demonstrating 3-vessel CAD (RCA, LAD, LCx). TCTS consult was recommended and patient subsequently underwent AVR, CABG x 2 (LIMA-LAD, SVG-RCA) and MAZE procedure/Atriclip with Dr. Tenny Craw 3/27.  Pertinent Medical History:   Past Medical History:  Diagnosis Date   Adenomatous colon polyp 09/2007   Arthritis    OA / PAIN LEFT KNEE   Cancer ([place])    prostate cancer   Cataract    removed both eyes   COVID-19 virus infection    Diverticulosis of colon    w/o hemorrage   Heart murmur    TOLD HE HAS A SLIGHT MURMUR   Hyperlipidemia    Inguinal hernia    left side    Thyroid disease    Significant Hospital Events: Including procedures, antibiotic start and stop dates in addition to other pertinent events   3/24 - Admit for Afib/RVR and NSTEMI. 3/25 - Echo EF 50-55%, low normal LV function, mildly reduced RV function with dilated LA/RA and findings concerning for low-flow low-gradient  AS., LHC with 3-vessel disease. TCTS consulted. 3/27 - POD#0 AVR, CABG x 2, MAZE, AtriClip (Enter). CCM consulted for MV/hemodynamics.  Interim History / Subjective:  Levo down to 1 In good spirits Denies pain Okay but not great urine output.  Objective:  Blood pressure (!) 86/56, pulse 78, temperature 98.4 F (36.9 C), temperature source Oral, resp. rate 15, height 5\' 7"  (1.702 m), weight 74.2 kg, SpO2 95 %.        Intake/Output Summary (Last 24 hours) at 08/22/2022 0740 Last data filed at 08/22/2022 0700 Gross per 24 hour  Intake 1975.63 ml  Output 1560 ml  Net 415.63 ml    Filed Weights   08/20/22 0500 08/21/22 0500 08/22/22 0500  Weight: 72 kg 73.7 kg 74.2 kg   Physical Examination: No distress sitting in chair Mild anasarca Lungs diminished L base: [place] small effusion minimal L diaphragm movement with inspiration Ext warm Aox3  CXR continued L hemidiaphragm elevation and blunting Hgb 7, suspect the 9.2 yesterday was wrong K/phos being repleted  Resolved Hospital Problem List:  Hypokalemia hypomagnesemia  Assessment & Plan:   S/p AVR 3/27 NSTEMI, CAD, S/p 2-vessel CABG, LIMA-LAD and SVG-RCA 3/27 Post pump vasoplegia - GDMT per TCTS - Wean levophed for MAP 65, increase midodrine - complete post-op antibiotics - pain control per protocol - progressive mobility - encourage IS  Paroxysmal atrial fibrillation, S/p MAZE 3/27 and AtriClip Afib with RVR New Afib with RVR noted on ED presentation 3/24. CHA2DS2-VASc score 3 on admission. -  monitor electrolytes, replete as needed: K/Phos being replaced again - tele monitoring - eventually needs AC when stable from surgical standpoint.  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

## 2022-08-22 NOTE — Progress Notes (Signed)
TCTS Progress Note: 4 Days Post-Op Procedure(s) (LRB): CORONARY ARTERY BYPASS GRAFTING (CABG) X2 USING LEFT INTERNAL MAMMARY ARTERY AND LEFT ENDOSCOPICALLY HARVESTED GREATER SAPHENOUS VEIN. (N/A) AORTIC VALVE REPLACEMENT (AVR) USING EDWARDS INSPIRIS AORTIC VALVE SIZE 23MM (N/A) MAZE USING ATRICURE ISOLATOR OLL2 (N/A) TRANSESOPHAGEAL ECHOCARDIOGRAM (N/A) CLIPPING OF ATRIAL APPENDAGE USING ATRICURE 37 ATRICLIP (N/A)  LOS: 7 days   Sitting up in chair Pacing 86 atrial, converted out of AF  Has had Bms On 1.5 of levo   Meds:  ASA: yes DVT ppx: yes BB: yes  Lasix: inc to 40 BID   Plan N no issues CV AF we are holding AC as I clipped appendage. WE are moving to amio po. We ared iong po 200 bid only as he is brady under the pacing and we dont want to get les than 60 bpm baseline.  On 1.5 levo, and midodrine 5 tid Resp: no issues GI: diet, had BM GU: 725 on Heme: may have been spurious hgb yest and now 7.0 - needs 1 u blood ID WC nl Wires - keep in for now. When he goes back in sinus may be low as he was 40s going into OR.    Plan 1 u rbc Get off levo after blood More diuresis Walking around  [place] floor tomorrow and keep bulb in as wires in.       Latest Ref Rng & Units 08/22/2022    3:53 AM 08/21/2022    4:19 AM 08/20/2022    3:52 AM  CBC  WBC 4.0 - 10.5 K/uL 7.5  7.7  8.9   Hemoglobin 13.0 - 17.0 g/dL 7.0  9.2  7.5   Hematocrit 39.0 - 52.0 % 20.2  27.7  21.7   Platelets 150 - 400 K/uL 151  108  97        Latest Ref Rng & Units 08/22/2022    3:53 AM 08/21/2022    4:19 AM 08/20/2022    3:52 AM  CMP  Glucose 70 - 99 mg/dL 108  137  126   BUN 8 - 23 mg/dL 15  17  9    Creatinine 0.61 - 1.24 mg/dL 1.19  1.00  0.98   Sodium 135 - 145 mmol/L 133  136  134   Potassium 3.5 - 5.1 mmol/L 3.3  3.6  3.5   Chloride 98 - 111 mmol/L 101  103  104   CO2 22 - 32 mmol/L 24  25  24    Calcium 8.9 - 10.3 mg/dL 7.8  8.1  7.6     ABG    Component Value Date/Time   PHART 7.425  08/19/2022 1019   PCO2ART 34.6 08/19/2022 1019   PO2ART 101 08/19/2022 1019   HCO3 22.6 08/19/2022 1019   TCO2 24 08/19/2022 1019   ACIDBASEDEF 1.0 08/19/2022 1019   O2SAT 98 08/19/2022 1019

## 2022-08-23 ENCOUNTER — Inpatient Hospital Stay (HOSPITAL_COMMUNITY): Payer: PPO

## 2022-08-23 ENCOUNTER — Telehealth: Payer: Self-pay | Admitting: Internal Medicine

## 2022-08-23 DIAGNOSIS — Z952 Presence of prosthetic heart valve: Secondary | ICD-10-CM | POA: Diagnosis not present

## 2022-08-23 DIAGNOSIS — E871 Hypo-osmolality and hyponatremia: Secondary | ICD-10-CM

## 2022-08-23 DIAGNOSIS — D649 Anemia, unspecified: Secondary | ICD-10-CM

## 2022-08-23 DIAGNOSIS — N179 Acute kidney failure, unspecified: Secondary | ICD-10-CM

## 2022-08-23 DIAGNOSIS — I214 Non-ST elevation (NSTEMI) myocardial infarction: Secondary | ICD-10-CM | POA: Diagnosis not present

## 2022-08-23 LAB — BASIC METABOLIC PANEL
Anion gap: 12 (ref 5–15)
BUN: 17 mg/dL (ref 8–23)
CO2: 24 mmol/L (ref 22–32)
Calcium: 8.2 mg/dL — ABNORMAL LOW (ref 8.9–10.3)
Chloride: 98 mmol/L (ref 98–111)
Creatinine, Ser: 1.29 mg/dL — ABNORMAL HIGH (ref 0.61–1.24)
GFR, Estimated: 58 mL/min — ABNORMAL LOW (ref >60)
Glucose, Bld: 82 mg/dL (ref 70–99)
Potassium: 3.6 mmol/L (ref 3.5–5.1)
Sodium: 134 mmol/L — ABNORMAL LOW (ref 135–145)

## 2022-08-23 LAB — CBC
HCT: 24.6 % — ABNORMAL LOW (ref 39.0–52.0)
HCT: 29.9 % — ABNORMAL LOW (ref 39.0–52.0)
Hemoglobin: 8.2 g/dL — ABNORMAL LOW (ref 13.0–17.0)
Hemoglobin: 9.9 g/dL — ABNORMAL LOW (ref 13.0–17.0)
MCH: 32.4 pg (ref 26.0–34.0)
MCH: 31.5 pg (ref 26.0–34.0)
MCHC: 33.3 g/dL (ref 30.0–36.0)
MCHC: 33.1 g/dL (ref 30.0–36.0)
MCV: 97.2 fL (ref 80.0–100.0)
MCV: 95.2 fL (ref 80.0–100.0)
Platelets: 191 10*3/uL (ref 150–400)
Platelets: 256 10*3/uL (ref 150–400)
RBC: 2.53 MIL/uL — ABNORMAL LOW (ref 4.22–5.81)
RBC: 3.14 MIL/uL — ABNORMAL LOW (ref 4.22–5.81)
RDW: 15.9 % — ABNORMAL HIGH (ref 11.5–15.5)
RDW: 16.6 % — ABNORMAL HIGH (ref 11.5–15.5)
WBC: 6.6 10*3/uL (ref 4.0–10.5)
WBC: 7.8 10*3/uL (ref 4.0–10.5)
nRBC: 0 % (ref 0.0–0.2)
nRBC: 0 % (ref 0.0–0.2)

## 2022-08-23 LAB — MAGNESIUM: Magnesium: 2.2 mg/dL (ref 1.7–2.4)

## 2022-08-23 LAB — ECHOCARDIOGRAM COMPLETE
AR max vel: 1.7 cm2
AV Area VTI: 1.62 cm2
AV Area mean vel: 1.64 cm2
AV Mean grad: 9.5 mmHg
AV Peak grad: 18.6 mmHg
Ao pk vel: 2.16 m/s
Area-P 1/2: 4.39 cm2
Calc EF: 58.2 %
Height: 67 in
S' Lateral: 3 cm
Single Plane A2C EF: 53.1 %
Single Plane A4C EF: 61.7 %
Weight: 2561.6 oz

## 2022-08-23 LAB — PHOSPHORUS: Phosphorus: 2.2 mg/dL — ABNORMAL LOW (ref 2.5–4.6)

## 2022-08-23 LAB — GLUCOSE, CAPILLARY
Glucose-Capillary: 83 mg/dL (ref 70–99)
Glucose-Capillary: 88 mg/dL (ref 70–99)

## 2022-08-23 LAB — PREPARE RBC (CROSSMATCH)

## 2022-08-23 MED ORDER — ALBUMIN HUMAN 5 % IV SOLN: 12.5000 g | Freq: Once | INTRAVENOUS | Status: AC

## 2022-08-23 MED ORDER — METOPROLOL SUCCINATE ER 25 MG PO TB24
25.0000 mg | ORAL_TABLET | Freq: Every day | ORAL | Status: DC
  Administered 2022-08-23 – 2022-08-24 (×2): 25 mg via ORAL
  Filled 2022-08-23 (×2): qty 1

## 2022-08-23 MED ORDER — ALBUMIN HUMAN 5 % IV SOLN
12.5000 g | Freq: Once | INTRAVENOUS | Status: AC
  Administered 2022-08-23: 12.5 g via INTRAVENOUS
  Filled 2022-08-23: qty 250

## 2022-08-23 MED ORDER — GLYCOPYRROLATE 1 MG PO TABS
2.0000 mg | ORAL_TABLET | Freq: Every day | ORAL | Status: DC
  Administered 2022-08-23 – 2022-08-26 (×4): 2 mg via ORAL
  Filled 2022-08-23 (×4): qty 2

## 2022-08-23 MED ORDER — POTASSIUM CHLORIDE CRYS ER 20 MEQ PO TBCR
20.0000 meq | EXTENDED_RELEASE_TABLET | ORAL | Status: DC
  Administered 2022-08-23: 20 meq via ORAL
  Filled 2022-08-23: qty 1

## 2022-08-23 MED ORDER — FUROSEMIDE 10 MG/ML IJ SOLN
20.0000 mg | Freq: Two times a day (BID) | INTRAMUSCULAR | Status: DC
  Administered 2022-08-23 (×2): 20 mg via INTRAVENOUS
  Filled 2022-08-23 (×2): qty 2

## 2022-08-23 MED ORDER — POTASSIUM PHOSPHATES 15 MMOLE/5ML IV SOLN
30.0000 mmol | Freq: Once | INTRAVENOUS | Status: AC
  Administered 2022-08-23: 30 mmol via INTRAVENOUS
  Filled 2022-08-23: qty 10

## 2022-08-23 MED ORDER — SODIUM CHLORIDE 0.9% IV SOLUTION: Freq: Once | INTRAVENOUS | Status: AC

## 2022-08-23 NOTE — Progress Notes (Signed)
      [address]       [place] [address]             [phone]      POD # 5 CABG, AVR  Comfortable  BP 122/80   Pulse 89   Temp 97.6 F (36.4 C) (Oral)   Resp 19   Ht 5\' 7"  (1.702 m)   Wt 72.6 kg   SpO2 96%   BMI 25.08 kg/m  A paced at 90 98% sat on RA  Intake/Output Summary (Last 24 hours) at 08/23/2022 1713 Last data filed at 08/23/2022 1700 Gross per 24 hour  Intake 1220.65 ml  Output 2125 ml  Net -904.35 ml    Received one unit PRBC earlier  Remo Lipps C. Roxan Hockey, MD [facility] [phone]

## 2022-08-23 NOTE — Progress Notes (Signed)
NAME:  Jeremy Waters, MRN:  [id number], DOB:  Jul 15, 1945, LOS: 8 ADMISSION DATE:  08/15/2022 CONSULTATION DATE:  08/18/2022 REFERRING MD:  Tenny Craw - TCTS CHIEF COMPLAINT:  S/p CABG, AVR, MAZE  History of Present Illness:  Jeremy Waters is seen in consultation at the request of Dr. Tenny Craw (TCTS) for management of mechanical ventilation and hemodynamics post-CABG/AVR/MAZE.  77 year old man who presented to [facility] 3/24 after his Apple Watch alerted him of possible AFib. Reported several weeks of intermittent L arm/shoulder pain lasting ~1 hour. PMHx significant for CAD, mild AS, PVCs (followed by Cardiology - Dr. Harl Bowie), HLD, hypothyroidism, prostate CA, former tobacco use.   In ER, noted to be in Afib with RVR (rates up to 130s). BNP and troponins were elevated with concern for NSTEMI. ASA/heparin gtt were started and Cardiology consulted. Echo 3/25 demonstrated EF 50-55%, low normal LV function, mildly reduced RV function with dilated LA/RA and findings concerning for low-flow low-gradient AS. LHC was completed 3/25 demonstrating 3-vessel CAD (RCA, LAD, LCx). TCTS consult was recommended and patient subsequently underwent AVR, CABG x 2 (LIMA-LAD, SVG-RCA) and MAZE procedure/Atriclip with Dr. Tenny Craw 3/27.  Pertinent Medical History:   Past Medical History:  Diagnosis Date   Adenomatous colon polyp 09/2007   Arthritis    OA / PAIN LEFT KNEE   Cancer ([place])    prostate cancer   Cataract    removed both eyes   COVID-19 virus infection    Diverticulosis of colon    w/o hemorrage   Heart murmur    TOLD HE HAS A SLIGHT MURMUR   Hyperlipidemia    Inguinal hernia    left side    Thyroid disease    Significant Hospital Events: Including procedures, antibiotic start and stop dates in addition to other pertinent events   3/24 - Admit for Afib/RVR and NSTEMI. 3/25 - Echo EF 50-55%, low normal LV function, mildly reduced RV function with dilated LA/RA and findings concerning for low-flow low-gradient  AS., LHC with 3-vessel disease. TCTS consulted. 3/27 - POD#0 AVR, CABG x 2, MAZE, AtriClip (Enter). CCM consulted for MV/hemodynamics. 3/31 received 1 unit pRBCs, increased midodrine.  Interim History / Subjective:  Still no appetite. Off NE this morning.    Objective:  Blood pressure 104/70, pulse 89, temperature 98.3 F (36.8 C), temperature source Oral, resp. rate 19, height 5\' 7"  (1.702 m), weight 72.6 kg, SpO2 98 %.        Intake/Output Summary (Last 24 hours) at 08/23/2022 0715 Last data filed at 08/23/2022 0600 Gross per 24 hour  Intake 2623.39 ml  Output 1750 ml  Net 873.39 ml    Filed Weights   08/21/22 0500 08/22/22 0500 08/23/22 0500  Weight: 73.7 kg 74.2 kg 72.6 kg   Physical Examination: Ill appearing man sitting up in the chair in NAD [place]/AT, eyes anicteric Breathing comfortably on [place], CTAB S1S2,RRR Abd soft, NT ++ edema, no cyanosis Awake, alert, answering questions appropriately. Globally weak.   BUN 17 Cr 1.29 WBC 6.6 H/h 8.2/24.6  Resolved Hospital Problem List:  Hypokalemia hypomagnesemia  Assessment & Plan:   S/p AVR 3/27 NSTEMI, CAD, S/p 2-vessel CABG, LIMA-LAD and SVG-RCA 3/27 Post pump vasoplegia - hold metoprolol today -increase HR with A-pacing today with HR 90 - stop amiodarone - con't midodrine; may need to restart NE if unable to maintain HR> 90 - pain control per protocol -progressive mobility -work on increasing PO intake -echo today  Poor PO intake -resume PTA glycopryolate -wife to bring  in protein shakes he likes from home, liberalize diet  Paroxysmal atrial fibrillation, S/p MAZE 3/27 and AtriClip Afib with RVR New Afib with RVR noted on ED presentation 3/24. CHA2DS2-VASc score 3 on admission. - hold amiodarone & metoprolol today -pace in 90s today - tele monitoring -not yet ready for [facility] with ongoing transfusion needs; monitor for bleeding.   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 Discussed during multidisciplinary rounds.    This patient is critically ill with multiple organ system failure which requires frequent high complexity decision making, assessment, support, evaluation, and titration of therapies. This was completed through the application of advanced monitoring technologies and extensive interpretation of multiple databases. During this encounter critical care time was devoted to patient care services described in this note for 40 minutes.  Julian Hy, DO 08/23/22 9:02 AM [facility]  For contact information, see Amion. If no response to pager, please call PCCM consult pager. After hours, 7PM- 7AM, please call Elink.

## 2022-08-23 NOTE — Progress Notes (Signed)
Physical Therapy Treatment Patient Details Name: Jeremy Waters MRN: [id number] DOB: 09-Nov-1945 Today's Date: 08/23/2022   History of Present Illness Pt is a 77 yo male admitted 3/24 with Afib. 3/25 LHC with 3 vessel CAD. 3/27 AVR, CABGx 2 and MAZE. PMHx: CAD, mild AS, PVCs, HLD, hypothyroidism, prostate CA, back sx, Lt TKA.    PT Comments    Pt with flat affect able to recall 3/4 precautions with education for all. Pt with maintained kyphotic posture with cues to look up and maintain precautions with all transfers. Pt tolerating increased gait distance with need for assist with bed mobility. Pt requesting return to bed and encouraged to be OOB for meals. Will continue to follow.   92-99% on RA   Recommendations for follow up therapy are one component of a multi-disciplinary discharge planning process, led by the attending physician.  Recommendations may be updated based on patient status, additional functional criteria and insurance authorization.  Follow Up Recommendations       Assistance Recommended at Discharge Intermittent Supervision/Assistance  Patient can return home with the following A little help with walking and/or transfers;A little help with bathing/dressing/bathroom;Assist for transportation;Assistance with cooking/housework   Equipment Recommendations  Rolling walker (2 wheels);BSC/3in1    Recommendations for Other Services       Precautions / Restrictions Precautions Precautions: Fall;Sternal Precaution Comments: pacer, JP drain     Mobility  Bed Mobility Overal bed mobility: Needs Assistance Bed Mobility: Rolling, Sidelying to Sit Rolling: Min guard Sidelying to sit: Min assist   Sit to supine: Min assist   General bed mobility comments: cues for sequence with pt able to roll to side and min assist to lift trunk from surface. Return to bed assist to lift legs to surface    Transfers Overall transfer level: Needs assistance   Transfers: Sit to/from  Stand Sit to Stand: Min guard           General transfer comment: cues for hand placement from bed and toilet    Ambulation/Gait Ambulation/Gait assistance: Min guard Gait Distance (Feet): 400 Feet Assistive device: Rolling walker (2 wheels) Gait Pattern/deviations: Step-through pattern, Decreased stride length, Trunk flexed   Gait velocity interpretation: 1.31 - 2.62 ft/sec, indicative of limited community ambulator   General Gait Details: cues for posture, increased stride and proximity with increased gait tolerance   Stairs             Wheelchair Mobility    Modified Rankin (Stroke Patients Only)       Balance Overall balance assessment: Needs assistance Sitting-balance support: No upper extremity supported, Feet supported Sitting balance-Leahy Scale: Fair     Standing balance support: Bilateral upper extremity supported, Reliant on assistive device for balance, No upper extremity supported   Standing balance comment: RW for gait                            Cognition Arousal/Alertness: Awake/alert Behavior During Therapy: WFL for tasks assessed/performed Overall Cognitive Status: Within Functional Limits for tasks assessed                                          Exercises General Exercises - Lower Extremity Long Arc Quad: AROM, Both, Seated, 20 reps Hip Flexion/Marching: AROM, Both, Seated, 20 reps    General Comments  Pertinent Vitals/Pain Pain Assessment Pain Assessment: No/denies pain    Home Living                          Prior Function            PT Goals (current goals can now be found in the care plan section) Progress towards PT goals: Progressing toward goals    Frequency    Min 1X/week      PT Plan Current plan remains appropriate    Co-evaluation              AM-PAC PT "6 Clicks" Mobility   Outcome Measure  Help needed turning from your back to your side while  in a flat bed without using bedrails?: A Little Help needed moving from lying on your back to sitting on the side of a flat bed without using bedrails?: A Little Help needed moving to and from a bed to a chair (including a wheelchair)?: A Little Help needed standing up from a chair using your arms (e.g., wheelchair or bedside chair)?: A Little Help needed to walk in hospital room?: A Little Help needed climbing 3-5 steps with a railing? : A Lot 6 Click Score: 17    End of Session   Activity Tolerance: Patient tolerated treatment well Patient left: in bed;with call bell/phone within reach;with nursing/sitter in room Nurse Communication: Mobility status PT Visit Diagnosis: Other abnormalities of gait and mobility (R26.89)     Time: [id number] PT Time Calculation (min) (ACUTE ONLY): 32 min  Charges:  $Gait Training: 8-22 mins $Therapeutic Activity: 8-22 mins                     Bayard Males, PT Acute Rehabilitation Services Office: [phone]    Lamarr Lulas 08/23/2022, 12:35 PM

## 2022-08-23 NOTE — Progress Notes (Addendum)
[address]       [place] [address]             [phone]      5 Days Post-Op Procedure(s) (LRB): CORONARY ARTERY BYPASS GRAFTING (CABG) X2 USING LEFT INTERNAL MAMMARY ARTERY AND LEFT ENDOSCOPICALLY HARVESTED GREATER SAPHENOUS VEIN. (N/A) AORTIC VALVE REPLACEMENT (AVR) USING EDWARDS INSPIRIS AORTIC VALVE SIZE 23MM (N/A) MAZE USING ATRICURE ISOLATOR OLL2 (N/A) TRANSESOPHAGEAL ECHOCARDIOGRAM (N/A) CLIPPING OF ATRIAL APPENDAGE USING ATRICURE 45 ATRICLIP (N/A)  Subjective:  Sitting up in chair, wants to get back into bed and go back to sleep.  He has some dizziness with ambulation   + BM  Objective: Vital signs in last 24 hours: Temp:  [97.1 F (36.2 C)-98.3 F (36.8 C)] 98.3 F (36.8 C) (04/01 0400) Pulse Rate:  [52-94] 62 (04/01 0715) Cardiac Rhythm: Atrial paced;Heart block (04/01 0400) Resp:  [11-24] 17 (04/01 0715) BP: (71-145)/(48-100) 94/64 (04/01 0715) SpO2:  [76 %-99 %] 97 % (04/01 0715) Weight:  [72.6 kg] 72.6 kg (04/01 0500)  Intake/Output from previous day: 03/31 0701 - 04/01 0700 In: 2624 [P.O.:480; I.V.:1224; Blood:410; IV [id number] Out: 1750 [id number]  General appearance: alert, cooperative, and no distress Heart: regular rate and rhythm Lungs: diminished breath sounds bibasilar Abdomen: soft, non-tender; bowel sounds normal; no masses,  no organomegaly Extremities: edema pitting Wound: clean and dry  Lab Results: Recent Labs    08/22/22 0353 08/22/22 1827 08/23/22 0315  WBC 7.5  --  6.6  HGB 7.0* 9.0* 8.2*  HCT 20.2* 27.5* 24.6*  PLT 151  --  191   BMET:  Recent Labs    08/22/22 0353 08/23/22 0315  NA 133* 134*  K 3.3* 3.6  CL 101 98  CO2 24 24  GLUCOSE 108* 82  BUN 15 17  CREATININE 1.19 1.29*  CALCIUM 7.8* 8.2*    PT/INR: No results for input(s): "LABPROT", "INR" in the last 72 hours. ABG    Component Value Date/Time   PHART 7.425 08/19/2022 1019   HCO3 22.6 08/19/2022 1019   TCO2 24 08/19/2022  1019   ACIDBASEDEF 1.0 08/19/2022 1019   O2SAT 98 08/19/2022 1019   CBG (last 3)  Recent Labs    08/22/22 2308 08/23/22 0312 08/23/22 0639  GLUCAP 87 83 88    Assessment/Plan: S/P Procedure(s) (LRB): CORONARY ARTERY BYPASS GRAFTING (CABG) X2 USING LEFT INTERNAL MAMMARY ARTERY AND LEFT ENDOSCOPICALLY HARVESTED GREATER SAPHENOUS VEIN. (N/A) AORTIC VALVE REPLACEMENT (AVR) USING EDWARDS INSPIRIS AORTIC VALVE SIZE 23MM (N/A) MAZE USING ATRICURE ISOLATOR OLL2 (N/A) TRANSESOPHAGEAL ECHOCARDIOGRAM (N/A) CLIPPING OF ATRIAL APPENDAGE USING ATRICURE 45 ATRICLIP (N/A)  CV- PAF, currently Sinus Bradycardia- off Levophed, will continue Amiodarone, patient has hypotension on Midodrine 10 TID, will transition Lopressor to Toprol XL 25 at bedtime to see if we can get higher BP during the day Pulm- off oxygen, dyspnea at times, will get 2V CXR in AM to assess left pleural effusion Renal- slight increase in creatinine to 1.29, suspect this is due to aggressive IV diuresis.Marland Kitchen will hold off on further diuresis for now, add TED hose, can restart low dose diuretic tomorrow if creatinine remains stable Expected post operative blood loss anemia,- Hgb up to 8.2 after transfusion CBGs controlled, patient is not a diabetic will d/c SSIP Dispo- patient stable, Sinus Bradycardia this morning.Marland Kitchen adjusted Lopressor to evening dosing to help with hypotension, on Midodrine.. hold diuretics with elevated creatinine.. ? Transfer to 4E, possibly d/c EPW today?   LOS:  8 days   Ellwood Handler, PA-C 08/23/2022  DR Tenny Craw ADDENDUM  Ongoing issues Lack of aptitie 15 lb above in weight and Cr slowly rising 1.3 from 1.0 pre BP soft - even on midodrine  Plan: Push/pull some with lasix 20 and albumin TTE (r/o effusion and wall motion) Cxr Today Dcd amio as may be making him nausea, if recurse > 90 bpm will have to reeval Restart robinol 1u rbc Dc central line after blood in for 2 hours

## 2022-08-23 NOTE — Progress Notes (Signed)
OT Cancellation Note  Patient Details Name: Jeremy Waters MRN: [id number] DOB: 1945-10-23   Cancelled Treatment:    Reason Eval/Treat Not Completed: Fatigue/lethargy limiting ability to participate- pt reports just finishing up with PT and requesting to rest at this time.  OT will check back as able.   Jolaine Artist, OT Acute Rehabilitation Services Office [phone]   [name] 08/23/2022, 11:33 AM

## 2022-08-23 NOTE — TOC Initial Note (Addendum)
Transition of Care [facility]) - Initial/Assessment Note    Patient Details  Name: Jeremy Waters MRN: [id number] Date of Birth: [birth year]  Transition of Care [facility]) CM/SW Contact:    Erenest Rasher, RN Phone Number: [phone] 08/23/2022, 4:06 PM  Clinical Narrative:                  CM spoke to pt and reports being independent PTA. PT recommending RW and 3n1 bedside commode. Wife will purchase a RW. Will order pt a RW with seat to use when he goes out.   Expected Discharge Plan: Home/Self Care Barriers to Discharge: Continued Medical Work up   Patient Goals and CMS Choice Patient states their goals for this hospitalization and ongoing recovery are:: wants to recover          Expected Discharge Plan and Services   Discharge Planning Services: CM Consult   Living arrangements for the past 2 months: Single Family Home                                      Prior Living Arrangements/Services Living arrangements for the past 2 months: Single Family Home Lives with:: Spouse Patient language and need for interpreter reviewed:: Yes Do you feel safe going back to the place where you live?: Yes      Need for Family Participation in Patient Care: No (Comment) Care giver support system in place?: Yes (comment)   Criminal Activity/Legal Involvement Pertinent to Current Situation/Hospitalization: No - Comment as needed  Activities of Daily Living Home Assistive Devices/Equipment: None ADL Screening (condition at time of admission) Patient's cognitive ability adequate to safely complete daily activities?: Yes Is the patient deaf or have difficulty hearing?: No Does the patient have difficulty seeing, even when wearing glasses/contacts?: No Does the patient have difficulty concentrating, remembering, or making decisions?: No Patient able to express need for assistance with ADLs?: Yes Does the patient have difficulty dressing or bathing?: No Independently performs ADLs?:  Yes (appropriate for developmental age) Does the patient have difficulty walking or climbing stairs?: No Weakness of Legs: None Weakness of Arms/Hands: None  Permission Sought/Granted Permission sought to share information with : Case Manager, Family Supports, PCP Permission granted to share information with : Yes, Verbal Permission Granted  Share Information with NAME: Demontray Dame     Permission granted to share info w Relationship: wife  Permission granted to share info w Contact Information: [phone]  Emotional Assessment Appearance:: Appears stated age Attitude/Demeanor/Rapport: Engaged Affect (typically observed): Accepting Orientation: : Oriented to Self, Oriented to Place, Oriented to  Time, Oriented to Situation   Psych Involvement: No (comment)  Admission diagnosis:  NSTEMI (non-ST elevated myocardial infarction) [I21.4] Atrial fibrillation [I48.91] S/P aortic valve replacement [Z95.2] Patient Active Problem List   Diagnosis Date Noted   S/P aortic valve replacement 08/18/2022   Hypotension 08/18/2022   S/P CABG x 2 08/18/2022   On mechanically assisted ventilation 08/18/2022   NSTEMI (non-ST elevated myocardial infarction) 08/15/2022   Atrial fibrillation 08/15/2022   Left arm pain 07/29/2022   Cervical radiculopathy 07/29/2022   GERD (gastroesophageal reflux disease) 03/18/2022   Murmur 02/11/2021   Premature beats 02/11/2021   Frequent PVCs 02/11/2021   LUQ pain 01/16/2019   Right cervical radiculopathy 03/03/2017   Spinal stenosis, lumbar 10/08/2015   Sensorineural hearing loss of both ears 04/10/2014   Organic impotence 04/10/2014   Hyperglycemia  04/02/2014   S/P left TKA 07/03/2013   Left knee DJD 06/07/2013   Hypothyroidism 03/03/2012   Axillary mass, right 04/07/2011   ROSACEA 03/19/2010   ARTHRALGIA 03/19/2010   CERVICALGIA 09/05/2009   CARPAL TUNNEL SYNDROME 06/17/2008   DEGENERATIVE JOINT DISEASE, GENERALIZED 06/17/2008   CERVICAL  RADICULOPATHY, LEFT 06/17/2008   Disturbance in sleep behavior 03/04/2008   PROSTATE CANCER, HX OF 03/04/2008   Hyperlipidemia 06/15/2007   DIVERTICULOSIS, COLON W/O HEM 03/03/2007   PCP:  Binnie Rail, MD Pharmacy:   [facility] [id number], [place] [address] Phone: [phone] Fax: [phone]  [facility] [place], [place] [place] [address] Phone: [phone] Fax: [phone]     Social Determinants of Health (SDOH) Social History: [place]: No Food Insecurity (08/15/2022)  Housing: Low Risk  (08/15/2022)  Transportation Needs: No Transportation Needs (08/15/2022)  Utilities: Not At Risk (08/15/2022)  Alcohol Screen: Low Risk  (03/18/2022)  Depression (PHQ2-9): Low Risk  (07/29/2022)  Financial Resource Strain: Low Risk  (03/18/2022)  Physical Activity: Sufficiently Active (03/18/2022)  Social Connections: Socially Integrated (03/18/2022)  Stress: No Stress Concern Present (03/18/2022)  Tobacco Use: Medium Risk (08/19/2022)   SDOH Interventions:     Readmission Risk Interventions     No data to display

## 2022-08-23 NOTE — Telephone Encounter (Signed)
Pharmacy called and said they have to inform the provider whenever they change manufacturers for medications. They said they recently change their manufacturer and wanted to make sure that was okay to still fill the patient's levothyroxine. Please give them a call back at [phone] to let them know.

## 2022-08-24 ENCOUNTER — Inpatient Hospital Stay (HOSPITAL_COMMUNITY): Payer: PPO

## 2022-08-24 DIAGNOSIS — D696 Thrombocytopenia, unspecified: Secondary | ICD-10-CM

## 2022-08-24 DIAGNOSIS — N179 Acute kidney failure, unspecified: Secondary | ICD-10-CM | POA: Diagnosis not present

## 2022-08-24 DIAGNOSIS — D62 Acute posthemorrhagic anemia: Secondary | ICD-10-CM | POA: Diagnosis not present

## 2022-08-24 DIAGNOSIS — I214 Non-ST elevation (NSTEMI) myocardial infarction: Secondary | ICD-10-CM | POA: Diagnosis not present

## 2022-08-24 LAB — TYPE AND SCREEN
ABO/RH(D): A POS
Antibody Screen: NEGATIVE
Unit division: 0
Unit division: 0

## 2022-08-24 LAB — BPAM RBC
Blood Product Expiration Date: 202404232359
Blood Product Expiration Date: 202404232359
ISSUE DATE / TIME: 202403311218
ISSUE DATE / TIME: 202404010959
Unit Type and Rh: 6200
Unit Type and Rh: 6200

## 2022-08-24 LAB — CBC
HCT: 28 % — ABNORMAL LOW (ref 39.0–52.0)
Hemoglobin: 9.5 g/dL — ABNORMAL LOW (ref 13.0–17.0)
MCH: 31.9 pg (ref 26.0–34.0)
MCHC: 33.9 g/dL (ref 30.0–36.0)
MCV: 94 fL (ref 80.0–100.0)
Platelets: 227 10*3/uL (ref 150–400)
RBC: 2.98 MIL/uL — ABNORMAL LOW (ref 4.22–5.81)
RDW: 16.5 % — ABNORMAL HIGH (ref 11.5–15.5)
WBC: 6.9 10*3/uL (ref 4.0–10.5)
nRBC: 0.7 % — ABNORMAL HIGH (ref 0.0–0.2)

## 2022-08-24 LAB — BASIC METABOLIC PANEL
Anion gap: 10 (ref 5–15)
BUN: 15 mg/dL (ref 8–23)
CO2: 23 mmol/L (ref 22–32)
Calcium: 8.4 mg/dL — ABNORMAL LOW (ref 8.9–10.3)
Chloride: 104 mmol/L (ref 98–111)
Creatinine, Ser: 1.38 mg/dL — ABNORMAL HIGH (ref 0.61–1.24)
GFR, Estimated: 53 mL/min — ABNORMAL LOW (ref >60)
Glucose, Bld: 97 mg/dL (ref 70–99)
Potassium: 3.6 mmol/L (ref 3.5–5.1)
Sodium: 137 mmol/L (ref 135–145)

## 2022-08-24 LAB — PHOSPHORUS: Phosphorus: 2.4 mg/dL — ABNORMAL LOW (ref 2.5–4.6)

## 2022-08-24 LAB — MAGNESIUM: Magnesium: 2 mg/dL (ref 1.7–2.4)

## 2022-08-24 MED ORDER — ORAL CARE MOUTH RINSE: 15.0000 mL | OROMUCOSAL | Status: DC | PRN

## 2022-08-24 MED ORDER — SODIUM CHLORIDE 0.9 % IV SOLN: 250.0000 mL | INTRAVENOUS | Status: DC | PRN

## 2022-08-24 MED ORDER — FUROSEMIDE 10 MG/ML IJ SOLN
20.0000 mg | Freq: Every day | INTRAMUSCULAR | Status: DC
  Administered 2022-08-24: 20 mg via INTRAVENOUS
  Filled 2022-08-24: qty 2

## 2022-08-24 MED ORDER — POTASSIUM PHOSPHATES 15 MMOLE/5ML IV SOLN
15.0000 mmol | Freq: Once | INTRAVENOUS | Status: AC
  Administered 2022-08-24: 15 mmol via INTRAVENOUS
  Filled 2022-08-24: qty 5

## 2022-08-24 MED ORDER — ~~LOC~~ CARDIAC SURGERY, PATIENT & FAMILY EDUCATION: Freq: Once | Status: AC

## 2022-08-24 MED ORDER — SODIUM CHLORIDE 0.9% FLUSH
3.0000 mL | Freq: Two times a day (BID) | INTRAVENOUS | Status: DC
  Administered 2022-08-24 – 2022-08-26 (×5): 3 mL via INTRAVENOUS

## 2022-08-24 MED ORDER — SODIUM CHLORIDE 0.9% FLUSH: 3.0000 mL | INTRAVENOUS | Status: DC | PRN

## 2022-08-24 MED ORDER — POTASSIUM CHLORIDE CRYS ER 20 MEQ PO TBCR
40.0000 meq | EXTENDED_RELEASE_TABLET | Freq: Once | ORAL | Status: AC
  Administered 2022-08-24: 40 meq via ORAL
  Filled 2022-08-24: qty 2

## 2022-08-24 NOTE — Progress Notes (Signed)
NAME:  Jeremy Waters, MRN:  [id number], DOB:  01-05-1946, LOS: 9 ADMISSION DATE:  08/15/2022 CONSULTATION DATE:  08/18/2022 REFERRING MD:  Tenny Craw - TCTS CHIEF COMPLAINT:  S/p CABG, AVR, MAZE  History of Present Illness:  Mr. Dockett is seen in consultation at the request of Dr. Tenny Craw (TCTS) for management of mechanical ventilation and hemodynamics post-CABG/AVR/MAZE.  77 year old man who presented to [facility] 3/24 after his Apple Watch alerted him of possible AFib. Reported several weeks of intermittent L arm/shoulder pain lasting ~1 hour. PMHx significant for CAD, mild AS, PVCs (followed by Cardiology - Dr. Harl Bowie), HLD, hypothyroidism, prostate CA, former tobacco use.   In ER, noted to be in Afib with RVR (rates up to 130s). BNP and troponins were elevated with concern for NSTEMI. ASA/heparin gtt were started and Cardiology consulted. Echo 3/25 demonstrated EF 50-55%, low normal LV function, mildly reduced RV function with dilated LA/RA and findings concerning for low-flow low-gradient AS. LHC was completed 3/25 demonstrating 3-vessel CAD (RCA, LAD, LCx). TCTS consult was recommended and patient subsequently underwent AVR, CABG x 2 (LIMA-LAD, SVG-RCA) and MAZE procedure/Atriclip with Dr. Tenny Craw 3/27.  Pertinent Medical History:   Past Medical History:  Diagnosis Date   Adenomatous colon polyp 09/2007   Arthritis    OA / PAIN LEFT KNEE   Cancer ([place])    prostate cancer   Cataract    removed both eyes   COVID-19 virus infection    Diverticulosis of colon    w/o hemorrage   Heart murmur    TOLD HE HAS A SLIGHT MURMUR   Hyperlipidemia    Inguinal hernia    left side    Thyroid disease    Significant Hospital Events: Including procedures, antibiotic start and stop dates in addition to other pertinent events   3/24 - Admit for Afib/RVR and NSTEMI. 3/25 - Echo EF 50-55%, low normal LV function, mildly reduced RV function with dilated LA/RA and findings concerning for low-flow low-gradient  AS., LHC with 3-vessel disease. TCTS consulted. 3/27 - POD#0 AVR, CABG x 2, MAZE, AtriClip (Enter). CCM consulted for MV/hemodynamics. 3/31 received 1 unit pRBCs, increased midodrine.  Interim History / Subjective:  Continued A-pacing at 90. Remains off vasopressors this morning.   Objective:  Blood pressure 101/70, pulse 89, temperature 98.2 F (36.8 C), temperature source Oral, resp. rate 16, height 5\' 7"  (1.702 m), weight 71.8 kg, SpO2 97 %.        Intake/Output Summary (Last 24 hours) at 08/24/2022 0715 Last data filed at 08/24/2022 0500 Gross per 24 hour  Intake 1236.68 ml  Output 1575 ml  Net -338.32 ml    Filed Weights   08/22/22 0500 08/23/22 0500 08/24/22 0500  Weight: 74.2 kg 72.6 kg 71.8 kg   Physical Examination: Elderly man sitting up in the chair in NAD [place]/AT, eyes anicteric Breathing comfortably on RA, CTAB S1S2, RRR, no murmurs or rubs. A-paced rhythm. Abd soft, Nt TED hose, + leg edema.  Skin warm, no diffuse rashes. Skin abrasion on left back.   BUN 15 Cr 1.38 H/H 9.5/28 CXR improving left dependent effusion Echo: LVEF 55-60%, hypokinesis in basal inferior and basl lateral walls. Normal RV function. Mild MR. Normal IVC size with reduced variability.   Resolved Hospital Problem List:  Hypokalemia hypomagnesemia  Assessment & Plan:   S/p AVR 3/27 NSTEMI, CAD, S/p 2-vessel CABG, LIMA-LAD and SVG-RCA 3/27 Post pump vasoplegia -hold metoprolol -A-pacing per TCTS -holding amiodarone -con't midodrine + TED hose -pain control per  protocol -mobility as able  Poor PO intake -con't PTA glycopyrrolate -encouraging PO intake  Paroxysmal atrial fibrillation, S/p MAZE 3/27 and AtriClip Afib with RVR New Afib with RVR noted on ED presentation 3/24. CHA2DS2-VASc score 3 on admission. 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 Discussed during multidisciplinary rounds.    This patient is critically ill with multiple organ system failure which requires frequent high complexity decision making, assessment, support, evaluation, and titration of therapies. This was completed through the application of advanced monitoring technologies and extensive interpretation of multiple databases. During this encounter critical care time was devoted to patient care services described in this note for 33 minutes.  Julian Hy, DO 08/24/22 8:40 AM [facility]  For contact information, see Amion. If no response to pager, please call PCCM consult pager. After hours, 7PM- 7AM, please call Elink.

## 2022-08-24 NOTE — Progress Notes (Signed)
Physical Therapy Treatment Patient Details Name: EMANUEL STROBLE MRN: [id number] DOB: 07-May-1946 Today's Date: 08/24/2022   History of Present Illness Pt is a 77 yo male admitted 3/24 with Afib. 3/25 LHC with 3 vessel CAD. 3/27 AVR, CABGx 2 and MAZE. PMHx: CAD, mild AS, PVCs, HLD, hypothyroidism, prostate CA, back sx, Lt TKA.    PT Comments    Pt pleasant and reports feeling better with continued awareness of sternal precautions. Pt able to walk long hall distance, complete stairs, perform HEP and get into/out of bed without physical assist. Pt pleased with progress and eager for return home. Will continue to follow to maximize independence.   HR 90 maintained 98% RA BP 120/80 (93)    Recommendations for follow up therapy are one component of a multi-disciplinary discharge planning process, led by the attending physician.  Recommendations may be updated based on patient status, additional functional criteria and insurance authorization.  Follow Up Recommendations       Assistance Recommended at Discharge Intermittent Supervision/Assistance  Patient can return home with the following A little help with walking and/or transfers;A little help with bathing/dressing/bathroom;Assist for transportation;Assistance with cooking/housework   Equipment Recommendations  Rolling walker (2 wheels);BSC/3in1    Recommendations for Other Services       Precautions / Restrictions Precautions Precautions: Fall;Sternal Precaution Comments: pacer, JP drain Restrictions Weight Bearing Restrictions: Yes     Mobility  Bed Mobility Overal bed mobility: Needs Assistance Bed Mobility: Supine to Sit, Sit to Supine     Supine to sit: Supervision Sit to supine: Supervision   General bed mobility comments: supervision for lines with pt using momentum to bring legs to surface to return to bed x 2 and pt able to rise to sitting to exit bed without cues or assist    Transfers Overall transfer level:  Needs assistance   Transfers: Sit to/from Stand Sit to Stand: Supervision           General transfer comment: good hand placement to rise from recliner, bed and toilet. supervision for lines    Ambulation/Gait Ambulation/Gait assistance: Min guard Gait Distance (Feet): 450 Feet Assistive device: Rolling walker (2 wheels) Gait Pattern/deviations: Step-through pattern, Decreased stride length, Trunk flexed   Gait velocity interpretation: >2.62 ft/sec, indicative of community ambulatory   General Gait Details: cues for posture, increased stride and proximity to [employer] Stairs: Yes Stairs assistance: Supervision Stair Management: Alternating pattern, Forwards, One rail Right Number of Stairs: 15 General stair comments: pt with limited use of rail with good upper body postioning   Wheelchair Mobility    Modified Rankin (Stroke Patients Only)       Balance Overall balance assessment: Needs assistance Sitting-balance support: No upper extremity supported, Feet supported Sitting balance-Leahy Scale: Good     Standing balance support: Bilateral upper extremity supported, Reliant on assistive device for balance, No upper extremity supported Standing balance-Leahy Scale: Fair Standing balance comment: RW for gait                            Cognition Arousal/Alertness: Awake/alert Behavior During Therapy: WFL for tasks assessed/performed Overall Cognitive Status: Within Functional Limits for tasks assessed                                          Exercises General Exercises - Lower Extremity  Long Arc Quad: AROM, Both, 10 reps, Seated Hip Flexion/Marching: AROM, Both, Seated, 10 reps    General Comments        Pertinent Vitals/Pain Pain Assessment Pain Assessment: No/denies pain    Home Living                          Prior Function            PT Goals (current goals can now be found in the care plan section)  Progress towards PT goals: Progressing toward goals    Frequency    Min 1X/week      PT Plan Current plan remains appropriate    Co-evaluation              AM-PAC PT "6 Clicks" Mobility   Outcome Measure  Help needed turning from your back to your side while in a flat bed without using bedrails?: A Little Help needed moving from lying on your back to sitting on the side of a flat bed without using bedrails?: A Little Help needed moving to and from a bed to a chair (including a wheelchair)?: A Little Help needed standing up from a chair using your arms (e.g., wheelchair or bedside chair)?: A Little Help needed to walk in hospital room?: A Little Help needed climbing 3-5 steps with a railing? : A Little 6 Click Score: 18    End of Session   Activity Tolerance: Patient tolerated treatment well Patient left: in bed;with call bell/phone within reach Nurse Communication: Mobility status PT Visit Diagnosis: Other abnormalities of gait and mobility (R26.89)     Time: [id number] PT Time Calculation (min) (ACUTE ONLY): 30 min  Charges:  $Gait Training: 8-22 mins $Therapeutic Activity: 8-22 mins                     Bayard Males, PT Acute Rehabilitation Services Office: [place] Zacharius Funari 08/24/2022, 10:12 AM

## 2022-08-24 NOTE — Telephone Encounter (Signed)
Called and left message with info on provider voicemail.

## 2022-08-24 NOTE — Progress Notes (Signed)
TCTS Progress Note: 6 Days Post-Op Procedure(s) (LRB): CORONARY ARTERY BYPASS GRAFTING (CABG) X2 USING LEFT INTERNAL MAMMARY ARTERY AND LEFT ENDOSCOPICALLY HARVESTED GREATER SAPHENOUS VEIN. (N/A) AORTIC VALVE REPLACEMENT (AVR) USING EDWARDS INSPIRIS AORTIC VALVE SIZE 23MM (N/A) MAZE USING ATRICURE ISOLATOR OLL2 (N/A) TRANSESOPHAGEAL ECHOCARDIOGRAM (N/A) CLIPPING OF ATRIAL APPENDAGE USING ATRICURE 62 ATRICLIP (N/A)  LOS: 9 days   Doing well off a pacing Can go to floor    Latest Ref Rng & Units 08/24/2022    3:44 AM 08/23/2022    6:05 PM 08/23/2022    3:15 AM  CBC  WBC 4.0 - 10.5 K/uL 6.9  7.8  6.6   Hemoglobin 13.0 - 17.0 g/dL 9.5  9.9  8.2   Hematocrit 39.0 - 52.0 % 28.0  29.9  24.6   Platelets 150 - 400 K/uL 227  256  191        Latest Ref Rng & Units 08/24/2022    3:44 AM 08/23/2022    3:15 AM 08/22/2022    3:53 AM  CMP  Glucose 70 - 99 mg/dL 97  82  108   BUN 8 - 23 mg/dL 15  17  15    Creatinine 0.61 - 1.24 mg/dL 1.38  1.29  1.19   Sodium 135 - 145 mmol/L 137  134  133   Potassium 3.5 - 5.1 mmol/L 3.6  3.6  3.3   Chloride 98 - 111 mmol/L 104  98  101   CO2 22 - 32 mmol/L 23  24  24    Calcium 8.9 - 10.3 mg/dL 8.4  8.2  7.8     ABG    Component Value Date/Time   PHART 7.425 08/19/2022 1019   PCO2ART 34.6 08/19/2022 1019   PO2ART 101 08/19/2022 1019   HCO3 22.6 08/19/2022 1019   TCO2 24 08/19/2022 1019   ACIDBASEDEF 1.0 08/19/2022 1019   O2SAT 98 08/19/2022 1019

## 2022-08-24 NOTE — Progress Notes (Signed)
Occupational Therapy Treatment Patient Details Name: Jeremy Waters MRN: [id number] DOB: 02-Dec-1945 Today's Date: 08/24/2022   History of present illness Pt is a 77 yo male admitted 3/24 with Afib. 3/25 LHC with 3 vessel CAD. 3/27 AVR, CABGx 2 and MAZE. PMHx: CAD, mild AS, PVCs, HLD, hypothyroidism, prostate CA, back sx, Lt TKA.   OT comments  Patient just returned from mobility with RN, but willing to practice UB and LB ADL given ROW restrictions related to sternal precautions.  Patient with good follow through and understanding, biggest complaint is fatigue and weakness.  Patient up and moving in room with RW with supervision.  Continue OT in the acute setting to address deficits, and given assist via spouse, no post acute OT is anticipated.     Recommendations for follow up therapy are one component of a multi-disciplinary discharge planning process, led by the attending physician.  Recommendations may be updated based on patient status, additional functional criteria and insurance authorization.    Assistance Recommended at Discharge Intermittent Supervision/Assistance  Patient can return home with the following  A little help with walking and/or transfers;A little help with bathing/dressing/bathroom;Assistance with cooking/housework;Assist for transportation;Help with stairs or ramp for entrance   Equipment Recommendations  Tub/shower seat    Recommendations for Other Services      Precautions / Restrictions Precautions Precautions: Fall;Sternal Precaution Comments: pacer Restrictions Weight Bearing Restrictions: Yes Other Position/Activity Restrictions: sternal       Mobility Bed Mobility               General bed mobility comments: up in recliner, and just returned from walking with RN    Transfers Overall transfer level: Needs assistance Equipment used: Rolling walker (2 wheels) Transfers: Sit to/from Stand Sit to Stand: Supervision                  Balance Overall balance assessment: Needs assistance Sitting-balance support: Feet supported Sitting balance-Leahy Scale: Good     Standing balance support: Reliant on assistive device for balance Standing balance-Leahy Scale: Fair Standing balance comment: able to static stand for grooming                           ADL either performed or assessed with clinical judgement   ADL                   Upper Body Dressing : Minimal assistance;Sitting;Adhering to UE precautions;Cueing for UE precautions   Lower Body Dressing: Supervision/safety;Sit to/from stand;Min guard Lower Body Dressing Details (indicate cue type and reason): able to figure 4 for socks Toilet Transfer: Supervision/safety;Rolling walker (2 wheels);Regular Toilet                  Extremity/Trunk Assessment Upper Extremity Assessment Upper Extremity Assessment: Generalized weakness   Lower Extremity Assessment Lower Extremity Assessment: Defer to PT evaluation   Cervical / Trunk Assessment Cervical / Trunk Assessment: Kyphotic                      Cognition Arousal/Alertness: Awake/alert Behavior During Therapy: WFL for tasks assessed/performed Overall Cognitive Status: Within Functional Limits for tasks assessed                                          Exercises      Shoulder Instructions  General Comments  VSS on RA    Pertinent Vitals/ Pain       Pain Assessment Pain Assessment: Faces Faces Pain Scale: Hurts little more Pain Location: sternal incision Pain Descriptors / Indicators: Tender, Sore Pain Intervention(s): Monitored during session                                                          Frequency  Min 2X/week        Progress Toward Goals  OT Goals(current goals can now be found in the care plan section)  Progress towards OT goals: Progressing toward goals  Acute Rehab OT Goals OT Goal  Formulation: With patient Time For Goal Achievement: 09/04/22 Potential to Achieve Goals: Good  Plan Discharge plan remains appropriate    Co-evaluation                 AM-PAC OT "6 Clicks" Daily Activity     Outcome Measure   Help from another person eating meals?: None Help from another person taking care of personal grooming?: None Help from another person toileting, which includes using toliet, bedpan, or urinal?: A Little Help from another person bathing (including washing, rinsing, drying)?: A Little Help from another person to put on and taking off regular upper body clothing?: A Little Help from another person to put on and taking off regular lower body clothing?: A Little 6 Click Score: 20    End of Session Equipment Utilized During Treatment: Rolling walker (2 wheels)  OT Visit Diagnosis: Other abnormalities of gait and mobility (R26.89);Muscle weakness (generalized) (M62.81)   Activity Tolerance Patient tolerated treatment well   Patient Left in chair;with call bell/phone within reach   Nurse Communication Mobility status        Time: [id number] OT Time Calculation (min): 17 min  Charges: OT General Charges $OT Visit: 1 Visit OT Treatments $Self Care/Home Management : 8-22 mins  08/24/2022  RP, OTR/L  Acute Rehabilitation Services  Office:  [phone]   Metta Clines 08/24/2022, 1:30 PM

## 2022-08-24 NOTE — Progress Notes (Addendum)
[address]       [place] [address]             [phone]      6 Days Post-Op Procedure(s) (LRB): CORONARY ARTERY BYPASS GRAFTING (CABG) X2 USING LEFT INTERNAL MAMMARY ARTERY AND LEFT ENDOSCOPICALLY HARVESTED GREATER SAPHENOUS VEIN. (N/A) AORTIC VALVE REPLACEMENT (AVR) USING EDWARDS INSPIRIS AORTIC VALVE SIZE 23MM (N/A) MAZE USING ATRICURE ISOLATOR OLL2 (N/A) TRANSESOPHAGEAL ECHOCARDIOGRAM (N/A) CLIPPING OF ATRIAL APPENDAGE USING ATRICURE 45 ATRICLIP (N/A)  Subjective:  Patient sitting up in chair.  Doing okay, feels a little better.  + ambulation   Objective: Vital signs in last 24 hours: Temp:  [97.4 F (36.3 C)-98.4 F (36.9 C)] 98.2 F (36.8 C) (04/02 0400) Pulse Rate:  [89-90] 89 (04/02 0700) Cardiac Rhythm: Atrial paced (04/02 0400) Resp:  [11-21] 16 (04/02 0700) BP: (90-129)/(58-88) 101/70 (04/02 0700) SpO2:  [92 %-99 %] 97 % (04/02 0700) Weight:  [71.8 kg] 71.8 kg (04/02 0500)  Intake/Output from previous day: 04/01 0701 - 04/02 0700 In: 1236.7 [I.V.:37.7; Blood:463.3; IV Piggyback:735.7] Out: 1575 [Urine:1575]  General appearance: alert, cooperative, and no distress Heart: regular rate and rhythm Lungs: diminished breath sounds left base Abdomen: soft, non-tender; bowel sounds normal; no masses,  no organomegaly Extremities: edema mild pitting, improving Wound: clean and dry  Lab Results: Recent Labs    08/23/22 1805 08/24/22 0344  WBC 7.8 6.9  HGB 9.9* 9.5*  HCT 29.9* 28.0*  PLT 256 227   BMET:  Recent Labs    08/23/22 0315 08/24/22 0344  NA 134* 137  K 3.6 3.6  CL 98 104  CO2 24 23  GLUCOSE 82 97  BUN 17 15  CREATININE 1.29* 1.38*  CALCIUM 8.2* 8.4*    PT/INR: No results for input(s): "LABPROT", "INR" in the last 72 hours. ABG    Component Value Date/Time   PHART 7.425 08/19/2022 1019   HCO3 22.6 08/19/2022 1019   TCO2 24 08/19/2022 1019   ACIDBASEDEF 1.0 08/19/2022 1019   O2SAT 98 08/19/2022 1019    CBG (last 3)  Recent Labs    08/22/22 2308 08/23/22 0312 08/23/22 0639  GLUCAP 87 83 88    Assessment/Plan: S/P Procedure(s) (LRB): CORONARY ARTERY BYPASS GRAFTING (CABG) X2 USING LEFT INTERNAL MAMMARY ARTERY AND LEFT ENDOSCOPICALLY HARVESTED GREATER SAPHENOUS VEIN. (N/A) AORTIC VALVE REPLACEMENT (AVR) USING EDWARDS INSPIRIS AORTIC VALVE SIZE 23MM (N/A) MAZE USING ATRICURE ISOLATOR OLL2 (N/A) TRANSESOPHAGEAL ECHOCARDIOGRAM (N/A) CLIPPING OF ATRIAL APPENDAGE USING ATRICURE 45 ATRICLIP (N/A)  CV- Previous A. Fib, currently NSR under pacer- continue Toprol XL at bedtime, BP is improving continue Midodrine 10 mg TID.Marland KitchenMarland Kitchen Echo with normal EF, global hypokinesis Pulm- off oxygen, CXR with left pleural effusion.. this does not appear large enough for Thoracentesis, however could have IR review to see if fluid is enough for Thoracentesis Renal- AKI- due to continued IV diuresis.. creatinine up further to 1.38, currently on IV Lasix at 20 mg BID.Marland Kitchen ?stop lasix, defer to Dr. Tenny Craw Expected post operative blood loss anemia- tx 1 unit of packed cells  yesterday, hgb up to 9 Dispo- patient stable, BP improving, remains in NSR under pacer.. ? D/c EPW today?, further increase in creatinine due to continued IV diuresis.Marland Kitchen will defer to Dr. Tenny Craw.. possibly ready for transfer to 4E   LOS: 9 days    Ellwood Handler, PA-C 08/24/2022  DR Tenny Craw ADDENDUM DC v wires DC central line 1 hour after v wires out Keep a wires  and keep pacing Floor transfer Keep bulb in  Changed lasix to once a day - appears only 3 lbs above baseline weight now.   [place]

## 2022-08-24 NOTE — TOC Progression Note (Signed)
Transition of Care [facility]) - Progression Note    Patient Details  Name: Jeremy Waters MRN: [id number] Date of Birth: [birth year]  Transition of Care [facility]) CM/SW Contact  Erenest Rasher, RN Phone Number: [phone] 08/24/2022, 4:02 PM  Clinical Narrative:     Ordered DME, Rollator and 3n1 bedside commode for home with [place].   Expected Discharge Plan: Home/Self Care Barriers to Discharge: Continued Medical Work up  Expected Discharge Plan and Services   Discharge Planning Services: CM Consult   Living arrangements for the past 2 months: Single Family Home                                       Social Determinants of Health (SDOH) Interventions SDOH Screenings   Food Insecurity: No Food Insecurity (08/15/2022)  Housing: Low Risk  (08/15/2022)  Transportation Needs: No Transportation Needs (08/15/2022)  Utilities: Not At Risk (08/15/2022)  Alcohol Screen: Low Risk  (03/18/2022)  Depression (PHQ2-9): Low Risk  (07/29/2022)  Financial Resource Strain: Low Risk  (03/18/2022)  Physical Activity: Sufficiently Active (03/18/2022)  Social Connections: Socially Integrated (03/18/2022)  Stress: No Stress Concern Present (03/18/2022)  Tobacco Use: Medium Risk (08/19/2022)    Readmission Risk Interventions     No data to display

## 2022-08-25 DIAGNOSIS — J90 Pleural effusion, not elsewhere classified: Secondary | ICD-10-CM | POA: Diagnosis not present

## 2022-08-25 DIAGNOSIS — I4891 Unspecified atrial fibrillation: Secondary | ICD-10-CM | POA: Diagnosis not present

## 2022-08-25 DIAGNOSIS — R600 Localized edema: Secondary | ICD-10-CM

## 2022-08-25 HISTORY — DX: Unspecified atrial fibrillation: I48.91

## 2022-08-25 LAB — CBC
HCT: 28.7 % — ABNORMAL LOW (ref 39.0–52.0)
Hemoglobin: 9.6 g/dL — ABNORMAL LOW (ref 13.0–17.0)
MCH: 32 pg (ref 26.0–34.0)
MCHC: 33.4 g/dL (ref 30.0–36.0)
MCV: 95.7 fL (ref 80.0–100.0)
Platelets: 272 10*3/uL (ref 150–400)
RBC: 3 MIL/uL — ABNORMAL LOW (ref 4.22–5.81)
RDW: 16 % — ABNORMAL HIGH (ref 11.5–15.5)
WBC: 7.5 10*3/uL (ref 4.0–10.5)
nRBC: 0 % (ref 0.0–0.2)

## 2022-08-25 LAB — BASIC METABOLIC PANEL
Anion gap: 9 (ref 5–15)
BUN: 15 mg/dL (ref 8–23)
CO2: 22 mmol/L (ref 22–32)
Calcium: 8.6 mg/dL — ABNORMAL LOW (ref 8.9–10.3)
Chloride: 105 mmol/L (ref 98–111)
Creatinine, Ser: 1.37 mg/dL — ABNORMAL HIGH (ref 0.61–1.24)
GFR, Estimated: 54 mL/min — ABNORMAL LOW (ref >60)
Glucose, Bld: 100 mg/dL — ABNORMAL HIGH (ref 70–99)
Potassium: 3.6 mmol/L (ref 3.5–5.1)
Sodium: 136 mmol/L (ref 135–145)

## 2022-08-25 LAB — MAGNESIUM: Magnesium: 2.1 mg/dL (ref 1.7–2.4)

## 2022-08-25 LAB — PHOSPHORUS: Phosphorus: 2.6 mg/dL (ref 2.5–4.6)

## 2022-08-25 MED ORDER — FUROSEMIDE 10 MG/ML IJ SOLN
20.0000 mg | Freq: Two times a day (BID) | INTRAMUSCULAR | Status: DC
  Administered 2022-08-25 (×2): 20 mg via INTRAVENOUS
  Filled 2022-08-25 (×2): qty 2

## 2022-08-25 MED ORDER — POTASSIUM CHLORIDE CRYS ER 20 MEQ PO TBCR
40.0000 meq | EXTENDED_RELEASE_TABLET | Freq: Two times a day (BID) | ORAL | Status: DC
  Administered 2022-08-25 (×2): 40 meq via ORAL
  Filled 2022-08-25 (×2): qty 2

## 2022-08-25 MED FILL — Sodium Chloride IV Soln 0.9%: INTRAVENOUS | Qty: 3000 | Status: AC

## 2022-08-25 MED FILL — Lidocaine HCl Local Preservative Free (PF) Inj 2%: INTRAMUSCULAR | Qty: 14 | Status: AC

## 2022-08-25 MED FILL — Sodium Bicarbonate IV Soln 8.4%: INTRAVENOUS | Qty: 50 | Status: AC

## 2022-08-25 MED FILL — Heparin Sodium (Porcine) Inj 1000 Unit/ML: INTRAMUSCULAR | Qty: 20 | Status: AC

## 2022-08-25 MED FILL — Electrolyte-R (PH 7.4) Solution: INTRAVENOUS | Qty: 5000 | Status: AC

## 2022-08-25 MED FILL — Potassium Chloride Inj 2 mEq/ML: INTRAVENOUS | Qty: 40 | Status: AC

## 2022-08-25 MED FILL — Mannitol IV Soln 20%: INTRAVENOUS | Qty: 500 | Status: AC

## 2022-08-25 MED FILL — Heparin Sodium (Porcine) Inj 1000 Unit/ML: Qty: 1000 | Status: AC

## 2022-08-25 MED FILL — Albumin, Human Inj 5%: INTRAVENOUS | Qty: 250 | Status: AC

## 2022-08-25 NOTE — Progress Notes (Signed)
CARDIAC REHAB PHASE I   PRE:  Rate/Rhythm: 70 a pacing    BP: sitting 89/58    SpO2: 97 RA  MODE:  Ambulation: 1110 ft   POST:  Rate/Rhythm: 64 pacing on demand    BP: sitting 123/81     SpO2: 100 RA  Pt eager to ambulate. Adjusted his rollator to highest setting. Pt stood with min assist and ambulated with standby assist. Tolerated well, no c/o, long distance. Pt did not always pace and HR in 60s walking. Return to recliner.   Pt and wife eager for education. Discussed IS, sternal precautions, exercise, diet, and CRPII. Very receptive. Will refer to [place].  [place] BS, ACSM-CEP 08/25/2022 11:52 AM

## 2022-08-25 NOTE — Progress Notes (Addendum)
      [address]       [place] [address]             [phone]      7 Days Post-Op Procedure(s) (LRB): CORONARY ARTERY BYPASS GRAFTING (CABG) X2 USING LEFT INTERNAL MAMMARY ARTERY AND LEFT ENDOSCOPICALLY HARVESTED GREATER SAPHENOUS VEIN. (N/A) AORTIC VALVE REPLACEMENT (AVR) USING EDWARDS INSPIRIS AORTIC VALVE SIZE 23MM (N/A) MAZE USING ATRICURE ISOLATOR OLL2 (N/A) TRANSESOPHAGEAL ECHOCARDIOGRAM (N/A) CLIPPING OF ATRIAL APPENDAGE USING ATRICURE 45 ATRICLIP (N/A)  Subjective:  Patient without complaints.  Had some diarrhea overnight.    Objective: Vital signs in last 24 hours: Temp:  [98.2 F (36.8 C)] 98.2 F (36.8 C) (04/02 2030) Pulse Rate:  [58-147] 62 (04/03 0700) Cardiac Rhythm: Normal sinus rhythm (04/03 0400) Resp:  [10-24] 15 (04/03 0700) BP: (86-136)/(51-98) 90/66 (04/03 0700) SpO2:  [71 %-99 %] 96 % (04/03 0700) Weight:  [69.9 kg] 69.9 kg (04/03 0500)  Intake/Output from previous day: 04/02 0701 - 04/03 0700 In: 111.8 [IV Piggyback:111.8] Out: 1550 [Urine:1550]  General appearance: alert, cooperative, and no distress Heart: regular rate and rhythm Lungs: diminished breath sounds bibasilar Abdomen: soft, non-tender; bowel sounds normal; no masses,  no organomegaly Extremities: edema trace Wound: clean and dry  Lab Results: Recent Labs    08/24/22 0344 08/25/22 0252  WBC 6.9 7.5  HGB 9.5* 9.6*  HCT 28.0* 28.7*  PLT 227 272   BMET:  Recent Labs    08/24/22 0344 08/25/22 0252  NA 137 136  K 3.6 3.6  CL 104 105  CO2 23 22  GLUCOSE 97 100*  BUN 15 15  CREATININE 1.38* 1.37*  CALCIUM 8.4* 8.6*    PT/INR: No results for input(s): "LABPROT", "INR" in the last 72 hours. ABG    Component Value Date/Time   PHART 7.425 08/19/2022 1019   HCO3 22.6 08/19/2022 1019   TCO2 24 08/19/2022 1019   ACIDBASEDEF 1.0 08/19/2022 1019   O2SAT 98 08/19/2022 1019   CBG (last 3)  Recent Labs    08/22/22 2308 08/23/22 0312  08/23/22 0639  GLUCAP 87 83 88    Assessment/Plan: S/P Procedure(s) (LRB): CORONARY ARTERY BYPASS GRAFTING (CABG) X2 USING LEFT INTERNAL MAMMARY ARTERY AND LEFT ENDOSCOPICALLY HARVESTED GREATER SAPHENOUS VEIN. (N/A) AORTIC VALVE REPLACEMENT (AVR) USING EDWARDS INSPIRIS AORTIC VALVE SIZE 23MM (N/A) MAZE USING ATRICURE ISOLATOR OLL2 (N/A) TRANSESOPHAGEAL ECHOCARDIOGRAM (N/A) CLIPPING OF ATRIAL APPENDAGE USING ATRICURE 45 ATRICLIP (N/A)  CV- Sinus Bradycardia- stop Toprol XL.Marland Kitchen per Dr. Tenny Craw will A pace at 23, on Midodrine for hypotension Pulm- bilateral pleural effusions, he is not requiring oxygen, continue IS Renal- creatinine stable at 1.37, + pitting edema on exam, weight is almost at baseline, continue Lasix 20 mg IV BID, supplement potasisum Expected post operative blood loss anemia, Hgb stable at 9.6 Dispo- patient stable, overall feels well.. per Dr. Tenny Craw will A Pace at 75, stop Toprol XL, continue midodrine for hypotension, diurese.. transfer to 4E   LOS: 10 days    Erin Barrett, PA-C 08/25/2022  DR Alphonsine Minium ADDENDUM  PLAN IS ATRIAL PACE AT 90 AND GO TO THE FLOOR ORDER FOR FLOOR WAS IN YESTERDAY CONT LASIX. HAS PITTING EDEMA BUT IS ALSO WITHIN 3LBS OF PREOP WIEGHT

## 2022-08-25 NOTE — Progress Notes (Signed)
NAME:  Jeremy Waters, MRN:  [id number], DOB:  02/20/1946, LOS: 69 ADMISSION DATE:  08/15/2022 CONSULTATION DATE:  08/18/2022 REFERRING MD:  Tenny Craw - TCTS CHIEF COMPLAINT:  S/p CABG, AVR, MAZE  History of Present Illness:  [name] is seen in consultation at the request of Dr. Tenny Craw (TCTS) for management of mechanical ventilation and hemodynamics post-CABG/AVR/MAZE.  77 year old man who [name] after his Apple Watch alerted him of possible AFib. Reported several weeks of intermittent L arm/shoulder pain lasting ~1 hour. PMHx significant for CAD, mild AS, PVCs (followed by Cardiology - Dr. Harl Bowie), HLD, hypothyroidism, prostate CA, former tobacco use.   In ER, noted to be in Afib with RVR (rates up to 130s). BNP and troponins were elevated with concern for NSTEMI. ASA/heparin gtt were started and Cardiology consulted. Echo 3/25 demonstrated EF 50-55%, low normal LV function, mildly reduced RV function with dilated LA/RA and findings concerning for low-flow low-gradient AS. LHC was completed 3/25 demonstrating 3-vessel CAD (RCA, LAD, LCx). TCTS consult was recommended and patient subsequently underwent AVR, CABG x 2 (LIMA-LAD, SVG-RCA) and MAZE procedure/Atriclip with Dr. Tenny Craw 3/27.  Pertinent Medical History:   Past Medical History:  Diagnosis Date   Adenomatous colon polyp 09/2007   Arthritis    OA / PAIN LEFT KNEE   Cancer ([place])    prostate cancer   Cataract    removed both eyes   COVID-19 virus infection    Diverticulosis of colon    w/o hemorrage   Heart murmur    TOLD HE HAS A SLIGHT MURMUR   Hyperlipidemia    Inguinal hernia    left side    Thyroid disease    Significant Hospital Events: Including procedures, antibiotic start and stop dates in addition to other pertinent events   3/24 - Admit for Afib/RVR and NSTEMI. 3/25 - Echo EF 50-55%, low normal LV function, mildly reduced RV function with dilated LA/RA and findings concerning for low-flow low-gradient  AS., LHC with 3-vessel disease. TCTS consulted. 3/27 - POD#0 AVR, CABG x 2, MAZE, AtriClip (Enter). CCM consulted for MV/hemodynamics. 3/31 received 1 unit pRBCs, increased midodrine.  Interim History / Subjective:  Feels well, wants to have less wires and tubes so he can walk around more.  Objective:  Blood pressure 90/66, pulse 62, temperature 98.2 F (36.8 C), temperature source Oral, resp. rate 15, height 5\' 7"  (1.702 m), weight 69.9 kg, SpO2 96 %.        Intake/Output Summary (Last 24 hours) at 08/25/2022 0720 Last data filed at 08/24/2022 1255 Gross per 24 hour  Intake 111.83 ml  Output 1550 ml  Net -1438.17 ml    Filed Weights   08/23/22 0500 08/24/22 0500 08/25/22 0500  Weight: 72.6 kg 71.8 kg 69.9 kg   Physical Examination: Elderly man sitting up in the chair in NAD [name]/AT, eyes anicteric Breathing comfortably on RA, reduced basilar breath sounds. No conversational dyspnea.  Abd soft, NT +edema to his knees, TED hose Skin warm, dry, no rashes  K+ 3.6 BUN 15 Cr 1.37 H/H 9.6/28.7 CXR> bilateral dependent effusions   Resolved Hospital Problem List:  Hypokalemia Hypomagnesemia Consumptive thrombocytopenia Hypervolemic hyponatremia  Assessment & Plan:   S/p AVR 3/27 NSTEMI, CAD, S/p 2-vessel CABG, LIMA-LAD and SVG-RCA 3/27 Post pump vasoplegia -stop metoprolol; atrial pacing at 90 -previously d/c amiodarone -con't midodrine -TED hose -lasix -Increase mobility as able  Paroxysmal atrial fibrillation, S/p MAZE 3/27 and AtriClip Afib with RVR; has been in sinus  rhythm New Afib with RVR noted on ED presentation 3/24. CHA2DS2-VASc score 3 on admission. 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 Discussed during multidisciplinary rounds.    Julian Hy, DO 08/25/22 8:35 AM [facility]  For contact information, see Amion. If no response to pager, please call PCCM consult pager. After hours, 7PM- 7AM, please call [place].

## 2022-08-26 LAB — BASIC METABOLIC PANEL
Anion gap: 7 (ref 5–15)
BUN: 13 mg/dL (ref 8–23)
CO2: 23 mmol/L (ref 22–32)
Calcium: 9 mg/dL (ref 8.9–10.3)
Chloride: 107 mmol/L (ref 98–111)
Creatinine, Ser: 1.36 mg/dL — ABNORMAL HIGH (ref 0.61–1.24)
GFR, Estimated: 54 mL/min — ABNORMAL LOW (ref >60)
Glucose, Bld: 101 mg/dL — ABNORMAL HIGH (ref 70–99)
Potassium: 3.9 mmol/L (ref 3.5–5.1)
Sodium: 137 mmol/L (ref 135–145)

## 2022-08-26 MED ORDER — FUROSEMIDE 20 MG PO TABS
20.0000 mg | ORAL_TABLET | Freq: Every day | ORAL | Status: DC
  Administered 2022-08-26: 20 mg via ORAL
  Filled 2022-08-26: qty 1

## 2022-08-26 MED ORDER — POTASSIUM CHLORIDE CRYS ER 10 MEQ PO TBCR: 10.0000 meq | EXTENDED_RELEASE_TABLET | Freq: Every day | ORAL | 1 refills | Status: DC

## 2022-08-26 MED ORDER — FUROSEMIDE 20 MG PO TABS: 20.0000 mg | ORAL_TABLET | Freq: Every day | ORAL | 1 refills | Status: DC

## 2022-08-26 MED ORDER — MIDODRINE HCL 10 MG PO TABS: 10.0000 mg | ORAL_TABLET | Freq: Three times a day (TID) | ORAL | 1 refills | Status: DC

## 2022-08-26 MED ORDER — TRAMADOL HCL 50 MG PO TABS: 50.0000 mg | ORAL_TABLET | Freq: Four times a day (QID) | ORAL | 0 refills | Status: DC | PRN

## 2022-08-26 MED ORDER — POTASSIUM CHLORIDE CRYS ER 10 MEQ PO TBCR
10.0000 meq | EXTENDED_RELEASE_TABLET | Freq: Every day | ORAL | Status: DC
  Administered 2022-08-26: 10 meq via ORAL
  Filled 2022-08-26: qty 1

## 2022-08-26 MED ORDER — ASPIRIN 81 MG PO TBEC: 81.0000 mg | DELAYED_RELEASE_TABLET | Freq: Every day | ORAL | Status: DC

## 2022-08-26 MED ORDER — CLOPIDOGREL BISULFATE 75 MG PO TABS: 75.0000 mg | ORAL_TABLET | Freq: Every day | ORAL | 11 refills | Status: DC

## 2022-08-26 NOTE — TOC Transition Note (Signed)
Transition of Care (TOC) - CM/SW Discharge Note Marvetta Gibbons RN, BSN Transitions of Care Unit 4E- RN Case Manager See Treatment Team for direct phone #   Patient Details  Name: Jeremy Waters MRN: [id number] Date of Birth: 03-20-1946  [facility]) CM/SW Contact:  Dawayne Patricia, RN Phone Number: 08/26/2022, 12:15 PM   Clinical Narrative:    Pt stable for transition home later today. Wife to transport home.   [facility] referral made on 4/2 for DME needs- rollator and [facility] - delivery made to room.   No further TOC needs noted. Pt to follow up as per AVS instructions.    Final next level of care: Home/Self Care Barriers to Discharge: Barriers Resolved   Patient Goals and CMS Choice CMS Medicare.gov Compare Post Acute Care list provided to:: Patient Choice offered to / list presented to : Patient  Discharge Placement                 Home        Discharge Plan and Services Additional resources added to the After Visit Summary for     Discharge Planning Services: CM Consult Post Acute Care Choice: Durable Medical Equipment          DME Arranged: Bedside commode, Walker rolling with seat DME Agency: [employer] Date DME Agency Contacted: 08/24/22   Representative spoke with at DME Agency: Erasmo Downer HH Arranged: NA [place] Agency: NA        Social Determinants of Health ([place]) Interventions SDOH Screenings   Food Insecurity: No Food Insecurity (08/15/2022)  Housing: Low Risk  (08/15/2022)  Transportation Needs: No Transportation Needs (08/15/2022)  Utilities: Not At Risk (08/15/2022)  Alcohol Screen: Low Risk  (03/18/2022)  Depression (PHQ2-9): Low Risk  (07/29/2022)  Financial Resource Strain: Low Risk  (03/18/2022)  Physical Activity: Sufficiently Active (03/18/2022)  Social Connections: Socially Integrated (03/18/2022)  Stress: No Stress Concern Present (03/18/2022)  Tobacco Use: Medium Risk (08/19/2022)     Readmission Risk Interventions     08/26/2022   12:15 PM  Readmission Risk Prevention Plan  Transportation Screening Complete  PCP or Specialist Appt within 5-7 Days Complete  Home Care Screening Complete  Medication Review (RN CM) Complete

## 2022-08-26 NOTE — Progress Notes (Signed)
Pt walked 476ft with min assistance. Pt in bed ready to have wires pulled.

## 2022-08-26 NOTE — Progress Notes (Signed)
PT Cancellation Note  Patient Details Name: Jeremy Waters MRN: [id number] DOB: Sep 02, 1945   Cancelled Treatment:    Reason Eval/Treat Not Completed: (P) Medical issues which prohibited therapy (pt had wires pulled recently, per RN pt on bed rest until after noon then will have drain removed, so plan to hold until after 1:30pm. Pt may also discharge later in the day.)   Jeremy Waters Jeremy Waters 08/26/2022, 11:26 AM

## 2022-08-26 NOTE — Progress Notes (Addendum)
      [address]       [place] [address]             [phone]      8 Days Post-Op Procedure(s) (LRB): CORONARY ARTERY BYPASS GRAFTING (CABG) X2 USING LEFT INTERNAL MAMMARY ARTERY AND LEFT ENDOSCOPICALLY HARVESTED GREATER SAPHENOUS VEIN. (N/A) AORTIC VALVE REPLACEMENT (AVR) USING EDWARDS INSPIRIS AORTIC VALVE SIZE 23MM (N/A) MAZE USING ATRICURE ISOLATOR OLL2 (N/A) TRANSESOPHAGEAL ECHOCARDIOGRAM (N/A) CLIPPING OF ATRIAL APPENDAGE USING ATRICURE 45 ATRICLIP (N/A)  Subjective:  Patient doing okay.  States he is having trouble sleeping as he gets tangled in all the wires he is connected to.  +BM  Objective: Vital signs in last 24 hours: Temp:  [97 F (36.1 C)-98.7 F (37.1 C)] 97.8 F (36.6 C) (04/04 0349) Pulse Rate:  [69-70] 70 (04/04 0349) Cardiac Rhythm: Atrial paced;Normal sinus rhythm (04/04 0700) Resp:  [14-19] 16 (04/04 0349) BP: (89-116)/(58-88) 105/70 (04/04 0349) SpO2:  [96 %-98 %] 96 % (04/04 0349) Weight:  [67.4 kg] 67.4 kg (04/04 0500)  Intake/Output from previous day: 04/03 0701 - 04/04 0700 In: 720 [P.O.:720] Out: 900 [Urine:900]  General appearance: alert, cooperative, and no distress Heart: regular rate and rhythm Lungs: diminished breath sounds bibasilar Abdomen: soft, non-tender; bowel sounds normal; no masses,  no organomegaly Extremities: edema trace, improving Wound: clean and dry  Lab Results: Recent Labs    08/24/22 0344 08/25/22 0252  WBC 6.9 7.5  HGB 9.5* 9.6*  HCT 28.0* 28.7*  PLT 227 272   BMET:  Recent Labs    08/24/22 0344 08/25/22 0252  NA 137 136  K 3.6 3.6  CL 104 105  CO2 23 22  GLUCOSE 97 100*  BUN 15 15  CREATININE 1.38* 1.37*  CALCIUM 8.4* 8.6*    PT/INR: No results for input(s): "LABPROT", "INR" in the last 72 hours. ABG    Component Value Date/Time   PHART 7.425 08/19/2022 1019   HCO3 22.6 08/19/2022 1019   TCO2 24 08/19/2022 1019   ACIDBASEDEF 1.0 08/19/2022 1019   O2SAT 98  08/19/2022 1019   CBG (last 3)  No results for input(s): "GLUCAP" in the last 72 hours.  Assessment/Plan: S/P Procedure(s) (LRB): CORONARY ARTERY BYPASS GRAFTING (CABG) X2 USING LEFT INTERNAL MAMMARY ARTERY AND LEFT ENDOSCOPICALLY HARVESTED GREATER SAPHENOUS VEIN. (N/A) AORTIC VALVE REPLACEMENT (AVR) USING EDWARDS INSPIRIS AORTIC VALVE SIZE 23MM (N/A) MAZE USING ATRICURE ISOLATOR OLL2 (N/A) TRANSESOPHAGEAL ECHOCARDIOGRAM (N/A) CLIPPING OF ATRIAL APPENDAGE USING ATRICURE 45 ATRICLIP (N/A)  CV- Sinus Bradycardia- patient's baseline HR is low 50s, hypotension stable on Midodrine.. this was also present preoperatively... can hopefully d/c EPW today Pulm- off oxygen, small left pleural effusion, no thoracentesis needed at this time...monitor Renal- no new labs, have ordered BMET, weight is trending down, will transition to oral Lasix,potassium  Dispo- patient stable, baseline bradycardia/hypotension on admission.. HR and BP are at baseline on Midodrine.. can hopefully d/c EPW today.. oral diuretics... if patients remains stable for discharge in the next 24-48 hours   LOS: 11 days    Erin Barrett, PA-C 08/26/2022  DR Judie Bonus ADDENDUM DC WIRES DC TUBE 1 hr later  Can go home today

## 2022-08-26 NOTE — Progress Notes (Signed)
Physical Therapy Treatment Patient Details Name: Jeremy Waters MRN: [id number] DOB: 09-Jun-1945 Today's Date: 08/26/2022   History of Present Illness Pt is a 77 yo male admitted 3/24 with Afib. 3/25 LHC with 3 vessel CAD. 3/27 AVR, CABGx 2 and MAZE. PMHx: CAD, mild AS, PVCs, HLD, hypothyroidism, prostate CA, back sx, Lt TKA.    PT Comments    Pt received seated EOB after working with mobility specialist, agreeable to therapy session for review of stair training, bed mobility within sternal precautions and activity pacing/energy conservation. Pt given sternal HEP and Move in the Tube precaution handout upon request. Pt mostly Supervision for safety with min cues for functional mobility tasks to maintain precs. Pt continues to benefit from PT services to progress toward functional mobility goals.    Recommendations for follow up therapy are one component of a multi-disciplinary discharge planning process, led by the attending physician.  Recommendations may be updated based on patient status, additional functional criteria and insurance authorization.  Follow Up Recommendations       Assistance Recommended at Discharge Intermittent Supervision/Assistance  Patient can return home with the following A little help with walking and/or transfers;A little help with bathing/dressing/bathroom;Assist for transportation;Assistance with cooking/housework   Equipment Recommendations  Rolling walker (2 wheels);BSC/3in1    Recommendations for Other Services       Precautions / Restrictions Precautions Precautions: Fall;Sternal Precaution Booklet Issued: Yes (comment) Precaution Comments: handout for Move in the Tube and sternal precs HEP brought to room for pt/spouse Restrictions Weight Bearing Restrictions: Yes Other Position/Activity Restrictions: sternal/Move in the Tube precs     Mobility  Bed Mobility Overal bed mobility: Needs Assistance Bed Mobility: Rolling, Sidelying to Sit, Sit to  Sidelying Rolling: Modified independent (Device/Increase time) Sidelying to sit: Modified independent (Device/Increase time)     Sit to sidelying: Modified independent (Device/Increase time) General bed mobility comments: good awareness of precs    Transfers Overall transfer level: Needs assistance Equipment used: Rollator (4 wheels) Transfers: Sit to/from Stand Sit to Stand: Supervision           General transfer comment: education to engage Rollator brakes before standing up, vc for scooting further to EoB for power up and sternal precs    Ambulation/Gait Ambulation/Gait assistance: Min guard Gait Distance (Feet): 50 Feet Assistive device: Rollator (4 wheels) Gait Pattern/deviations: Step-through pattern, Decreased stride length, Trunk flexed Gait velocity: WFL     General Gait Details: cues for posture and keeping elbows tucked close to body while using BUE on rollator to prevent pressure on sternal incision   Stairs Stairs: Yes Stairs assistance: Supervision Stair Management: Two rails, Step to pattern, Forwards Number of Stairs: 8 General stair comments: single step x8 reps in room, cues for safety/sequencing, cues for keeping elbow close to his body   Wheelchair Mobility    Modified Rankin (Stroke Patients Only)       Balance Overall balance assessment: Needs assistance Sitting-balance support: Feet supported Sitting balance-Leahy Scale: Good     Standing balance support: During functional activity Standing balance-Leahy Scale: Fair Standing balance comment: able to static stand without UE support benefits from UE support for dynamic balance                            Cognition Arousal/Alertness: Awake/alert Behavior During Therapy: WFL for tasks assessed/performed Overall Cognitive Status: Within Functional Limits for tasks assessed  Exercises      General Comments General  comments (skin integrity, edema, etc.): VSS on RA      Pertinent Vitals/Pain Pain Assessment Pain Assessment: No/denies pain           PT Goals (current goals can now be found in the care plan section) Acute Rehab PT Goals Patient Stated Goal: return to golf PT Goal Formulation: With patient/family Time For Goal Achievement: 09/04/22 Progress towards PT goals: Progressing toward goals    Frequency    Min 1X/week      PT Plan Current plan remains appropriate       AM-PAC PT "6 Clicks" Mobility   Outcome Measure  Help needed turning from your back to your side while in a flat bed without using bedrails?: None Help needed moving from lying on your back to sitting on the side of a flat bed without using bedrails?: None Help needed moving to and from a bed to a chair (including a wheelchair)?: A Little Help needed standing up from a chair using your arms (e.g., wheelchair or bedside chair)?: A Little Help needed to walk in hospital room?: A Little Help needed climbing 3-5 steps with a railing? : A Little 6 Click Score: 20    End of Session Equipment Utilized During Treatment: Gait belt Activity Tolerance: Patient tolerated treatment well Patient left: in bed;with call bell/phone within reach;with family/visitor present Nurse Communication: Mobility status PT Visit Diagnosis: Other abnormalities of gait and mobility (R26.89)     Time: [id number] PT Time Calculation (min) (ACUTE ONLY): 17 min  Charges:  $Gait Training: 8-22 mins                     Thane Age P., PTA Acute Rehabilitation Services Secure Chat Preferred 9a-5:30pm Office: [place] 08/26/2022, 7:41 PM

## 2022-08-26 NOTE — Progress Notes (Signed)
Pt discharged in stable condition having received and verbalized understanding of discharge teaching. Pt will pick up medications from listed preferred pharmacyon the way home. Pt escorted to main entrance by floor staff.

## 2022-08-26 NOTE — Progress Notes (Signed)
Mobility Specialist: Progress Note   08/26/22 1448  Mobility  Activity Ambulated with assistance in hallway  Level of Assistance Contact guard assist, steadying assist  Assistive Device Four wheel walker  Distance Ambulated (ft) 940 ft  RUE Weight Bearing NWB  LUE Weight Bearing NWB  Activity Response Tolerated well  Mobility Referral Yes  $Mobility charge 1 Mobility   Pre-Mobility: 61 HR, 98% SpO2 Post-Mobility: 67 HR  Pt received in the chair and agreeable to mobility. Contact guard to stand and during ambulation. No c/o throughout. After returning to the room educated pt on log roll technique and practiced getting in/out of bed. No c/o throughout. Pt sitting EOB after session with PTA and pt's wife present in the room.   Sedalia Daxson Reffett Mobility Specialist Please contact via SecureChat or Rehab office at [phone]

## 2022-08-27 ENCOUNTER — Encounter: Payer: Self-pay | Admitting: *Deleted

## 2022-08-27 ENCOUNTER — Ambulatory Visit: Payer: Self-pay

## 2022-08-27 ENCOUNTER — Telehealth: Payer: Self-pay | Admitting: *Deleted

## 2022-08-27 NOTE — Transitions of Care (Post Inpatient/ED Visit) (Signed)
   08/27/2022  Name: Jeremy Waters MRN: [id number] DOB: 1945-11-02  Today's TOC FU Call Status: Today's TOC FU Call Status:: Unsuccessul Call (1st Attempt) Unsuccessful Call (1st Attempt) Date: 08/27/22  Attempted to reach the patient regarding the most recent Inpatient visit; left HIPAA compliant voice message requesting call back  Follow Up Plan: Additional outreach attempts will be made to reach the patient to complete the Transitions of Care (Post Inpatient visit) call.   Caryl Pina, RN, BSN, CCRN Alumnus RN CM Care Coordination/ Transition of Care- [facility] Care Management [phone]: direct office

## 2022-08-27 NOTE — Chronic Care Management (AMB) (Signed)
   08/27/2022  Jeremy Waters Sep 13, 1945 [id number]   Reason for Encounter: Patient is not currently enrolled in the CCM program. CCM status changed to previously enrolled  Alto Denver RN, MSN, CCM RN Care Manager  Chronic Care Management Direct Number: [phone]

## 2022-08-30 ENCOUNTER — Encounter: Payer: Self-pay | Admitting: *Deleted

## 2022-08-30 ENCOUNTER — Telehealth: Payer: Self-pay | Admitting: *Deleted

## 2022-08-30 NOTE — Transitions of Care (Post Inpatient/ED Visit) (Signed)
08/30/2022  Name: Jeremy Waters MRN: [id number] DOB: 05-07-1946  Today's TOC FU Call Status: Today's TOC FU Call Status:: Successful TOC FU Call Competed TOC FU Call Complete Date: 08/30/22  Transition Care Management Follow-up Telephone Call Date of Discharge: 08/26/22 Discharge Facility: [facility]) Type of Discharge: Inpatient Admission How have you been since you were released from the hospital?: Better ("I am doing fine, not having any problems.  My daughter came by and helped [place] make sure we were doing the medications correctly-- and we are.  I did go into A-Fib twice, according to my smart watch, but only for a few minutes it only went up to the 90's") Any questions or concerns?: No  Items Reviewed: Did you receive and understand the discharge instructions provided?: Yes (thoroughly reviewed with patient/ spouse/ daughter who verbalizes very good understanding of same) Medications obtained and verified?: Yes (Medications Reviewed) (Full medication review completed; no concerns or discrepancies identified; confirmed patient obtained/ is taking all newly Rx'd medications as instructed; self-manages medications and denies questions/ concerns around medications today) Any new allergies since your discharge?: No Dietary orders reviewed?: Yes Type of Diet Ordered:: Heart Healthy/ Low Salt- "we try to follow as best as we can, and we do eat as healthy as we can" Do you have support at home?: Yes People in Home: spouse Name of Support/Comfort Primary Source: reports is independent in self-care activities; spouse/ daughter assists as needed/ indicated  Home Care and Equipment/Supplies: Were Home Health Services Ordered?: No Any new equipment or medical supplies ordered?: No  Functional Questionnaire: Do you need assistance with bathing/showering or dressing?: No Do you need assistance with meal preparation?: No Do you need assistance with eating?: No Do you have difficulty  maintaining continence: No Do you need assistance with getting out of bed/getting out of a chair/moving?: No Do you have difficulty managing or taking your medications?: No  Follow up appointments reviewed: PCP Follow-up appointment confirmed?: NA (verified no PCP hospital follow up office visit recommended per review of hospital discharging provider notes in EHR) Specialist Hospital Follow-up appointment confirmed?: Yes Date of Specialist follow-up appointment?: 09/07/22 Follow-Up Specialty Provider:: cardiology provider on 09/07/22; TCTS on 09/09/22- verified these are within recommended time frame from review of hospital discharging provider notes Do you need transportation to your follow-up appointment?: No Do you understand care options if your condition(s) worsen?: Yes-patient verbalized understanding  SDOH Interventions Today    Flowsheet Row Most Recent Value  SDOH Interventions   Transportation Interventions Intervention Not Indicated  [normally drives self,  spouse/ family assisting during surgery recuperation]      TOC Interventions Today    Flowsheet Row Most Recent Value  TOC Interventions   TOC Interventions Discussed/Reviewed TOC Interventions Discussed, Post discharge activity limitations per provider  [provided my direct contact information should questions/ concerns/ needs arise post-TOC call, prior to RN CM telephone visit]      Interventions Today    Flowsheet Row Most Recent Value  Chronic Disease   Chronic disease during today's visit Other  [NSTEMI,  CABG x 2,  AVR]  General Interventions   General Interventions Discussed/Reviewed General Interventions Discussed, Doctor Visits, Durable Medical Equipment (DME), Referral to Nurse, Communication with  Doctor Visits Discussed/Reviewed Specialist, Doctor Visits Discussed  Durable Medical Equipment (DME) Dan Humphreys, Bed side commode  [confirmed obtained both prior to hospital discharge,  using walker]  PCP/Specialist  Visits Compliance with follow-up visit  Communication with RN  Exercise Interventions   Exercise  Discussed/Reviewed --  [discussed cardiac rehabilitation referral,  confirmed patient has address/ contact information for same]  Education Interventions   Education Provided Provided Education  Provided Verbal Education On Other, Medication, When to see the doctor  [importance/ purpose of daily weight monitoring at home- weight gain guidelines along with corresponding action plan,  signs/ symptoms A-Fib along with action plan,  need to record all weights and episodes of AF,  purpose of ACT in setting of AF]  Nutrition Interventions   Nutrition Discussed/Reviewed Nutrition Discussed, Decreasing salt  Pharmacy Interventions   Pharmacy Dicussed/Reviewed Pharmacy Topics Discussed, Medications and their functions  [Full medication review with updating medication list in EHR per patient report]  Safety Interventions   Safety Discussed/Reviewed Safety Discussed  [use of new walker]      Caryl Pina, RN, BSN, CCRN Alumnus RN CM Care Coordination/ Transition of Care- [facility] Care Management [phone]: direct office

## 2022-08-31 ENCOUNTER — Telehealth: Payer: Self-pay | Admitting: *Deleted

## 2022-08-31 ENCOUNTER — Encounter: Payer: Self-pay | Admitting: *Deleted

## 2022-08-31 NOTE — Patient Outreach (Signed)
  Care Coordination   Post-TOC Care Coordination Telephone  Visit Note   08/31/2022 Name: ZAKKARY BRISTOW MRN: [id number] DOB: [birth year]  [name] is a 77 y.o. year old male who sees Burns, Bobette Mo, MD for primary care. I spoke with  Maryella Shivers Clouse by phone today; patient called me back after [facility] call yesterday and requested call-back; called patient accordingly and successfully connected.  What matters to the patients health and wellness today?  "Thanks for calling me back; I just noticed today that there is some bruising near mu upper/ inner thigh that I had not noticed before today; it's a little sore, but not bad.  It is brown-ish in color and has no heat.  There is no swelling.  My heart rate has stayed in the 60's for 2 days and my smart watch has not alerted me to having been in A-Fib over the last 2 days, and I feel just fine.  I will watch the area as you have suggested; I thought it was normal- but I wanted to call and see what you said, since I just noticed it today"   Goals Addressed             This Visit's Progress    Care Coordination Activities: No follow up required       Interventions Today    Flowsheet Row Most Recent Value  Chronic Disease   Chronic disease during today's visit Other  [NSTEMI,  CABG x 2]  General Interventions   General Interventions Discussed/Reviewed General Interventions Discussed, General Interventions Reviewed, Doctor Visits  Doctor Visits Discussed/Reviewed Doctor Visits Discussed, Specialist  Education Interventions   Education Provided Provided Education  Provided Verbal Education On Other, When to see the doctor  [Action plan to monitor bruising from surgical site, along with signs/ symptoms to report if area changes,  triage around current condition/ questions]             SDOH assessments and interventions completed:  Yes Previously completed during [facility] call on 08/30/22   Care Coordination Interventions:  Yes, provided    Follow up plan: Follow up call scheduled for 09/14/22 with RN CM Care Coordinator   Encounter Outcome:  Pt. Visit Completed   Caryl Pina, RN, BSN, CCRN Alumnus RN CM Care Coordination/ Transition of Care- [facility] Care Management [phone]: direct office

## 2022-09-01 NOTE — Progress Notes (Signed)
      [address]       [place] [address]             [phone]                    ELLWOOD EADE [place] Medical Record #[id number] Date of Birth: 08-02-1945  Referring: Pincus Sanes, MD Primary Care: Pincus Sanes, MD Primary Cardiologist: None  S/P on 08/18/22  OPERATION: Aortic Valve Replacement (48mm [place], [place] [id number]) MAZE (Pulmonary vein isolation, bilateral) Left atrial appendage clip (59mm Atriclip) CABG x 2 (LIMA - LAD, SVG - RCA) Endoscopic saphenous vein harvest, left Endoscopic saphenous vein evaluation, right   Came in couple days ago and injured rotator cuff on right side.   PHYSICAL EXAMINATION: There were no vitals taken for this visit.  Gen: NAD Neuro: Alert and oriented Chest: incision healing well Resp: Nonlaboured Abd: Soft, ntnd Extr: WWP, 1+left  LE edema bilateral  Diagnostic Studies & Laboratory data:    XR today: clear lung fields. Normal appearance.   Assessment / Plan:   S/P AVR/MAZE/CAB/LAA clip  Cariology planning compression stockings, pt has ordered.  Was 145 lb before surgery, now 139 lb.  Is going to be done with midodrine in 7 days.    Incisions healing well Sternal precautions for 6 weeks from surgery Is on ASA/Plavix. Would continue plavix for 3-12 months.  Plan to see back in 3 weeks with PA   Waverly Ferrari Rasa Degrazia 09/01/2022 4:11 PM

## 2022-09-03 ENCOUNTER — Telehealth: Payer: PPO

## 2022-09-03 NOTE — Progress Notes (Unsigned)
Cardiology Clinic Note   Patient Name: Jeremy Waters Date of Encounter: 09/07/2022  Primary Care Provider:  Pincus Sanes, MD Primary Cardiologist:  None  Patient Profile    Jeremy Waters 77 year old male presents to the clinic today for follow-up evaluation of his coronary artery disease status post CABG 08/18/2022 and AVR.  Past Medical History    Past Medical History:  Diagnosis Date   Adenomatous colon polyp 09/2007   Arthritis    OA / PAIN LEFT KNEE   Cancer    prostate cancer   Cataract    removed both eyes   COVID-19 virus infection    Diverticulosis of colon    w/o hemorrage   Heart murmur    TOLD HE HAS A SLIGHT MURMUR   Hyperlipidemia    Inguinal hernia    left side    Thyroid disease    Past Surgical History:  Procedure Laterality Date   AORTIC VALVE REPLACEMENT N/A 08/18/2022   Procedure: AORTIC VALVE REPLACEMENT (AVR) USING EDWARDS INSPIRIS AORTIC VALVE SIZE ;  Surgeon: Lyn Hollingshead, MD;  Location: [facility] OR;  Service: Open Heart Surgery;  Laterality: N/A;   BACK SURGERY  05/12/2005   L5   CLIPPING OF ATRIAL APPENDAGE N/A 08/18/2022   Procedure: CLIPPING OF ATRIAL APPENDAGE USING ATRICURE 45 ATRICLIP;  Surgeon: Lyn Hollingshead, MD;  Location: [facility] OR;  Service: Open Heart Surgery;  Laterality: N/A;  Median Sternotomy   COLONOSCOPY  1999   Negative; Dr Russella Dar   COLONOSCOPY  2004   tics, hemorrhoids   COLONOSCOPY  2009   polyps (T.A.)   CORONARY ARTERY BYPASS GRAFT N/A 08/18/2022   Procedure: CORONARY ARTERY BYPASS GRAFTING (CABG) X2 USING LEFT INTERNAL MAMMARY ARTERY AND LEFT ENDOSCOPICALLY HARVESTED GREATER SAPHENOUS VEIN.;  Surgeon: Lyn Hollingshead, MD;  Location: [facility] OR;  Service: Open Heart Surgery;  Laterality: N/A;   HERNIA REPAIR  1987   HERNIA REPAIR  10/2003   KNEE SURGERY  1969   Patellar fracture fragment Lt   LAMINECTOMY  2000   L4-5   LEFT HEART CATH AND CORONARY ANGIOGRAPHY N/A 08/16/2022   Procedure: LEFT HEART CATH AND CORONARY  ANGIOGRAPHY;  Surgeon: Orbie Pyo, MD;  Location: [facility] INVASIVE CV LAB;  Service: Cardiovascular;  Laterality: N/A;   LUMBAR LAMINECTOMY/DECOMPRESSION MICRODISCECTOMY Right 10/08/2015   Procedure: MICRO LUMBAR DECOMPRESSION L4-L5 AND L5-S1 ON RIGHT ;  Surgeon: Jene Every, MD;  Location: [facility] ORS;  Service: Orthopedics;  Laterality: Right;   MASS EXCISION  04/26/2011   Procedure: MINOR EXCISION OF MASS;  Surgeon: Velora Heckler, MD;  Location:  SURGERY CENTER;  Service: General;  Laterality: Right;  Excise subcutaneous mass right axilla Minor Room   MAZE N/A 08/18/2022   Procedure: MAZE USING ATRICURE Oralia Manis;  Surgeon: Lyn Hollingshead, MD;  Location[facility] OR;  Service: Open Heart Surgery;  Laterality: N/A;   POLYPECTOMY     PROSTATECTOMY  04/16/2005   TEE WITHOUT CARDIOVERSION N/A 08/18/2022   Procedure: TRANSESOPHAGEAL ECHOCARDIOGRAM;  Surgeon: Lyn Hollingshead, MD;  Location: [facility] OR;  Service: Open Heart Surgery;  Laterality: N/A;   TONSILLECTOMY     TOTAL KNEE ARTHROPLASTY Left 07/03/2013   Procedure: LEFT TOTAL KNEE ARTHROPLASTY;  Surgeon: Shelda Pal, MD;  Location: [facility] ORS;  Service: Orthopedics;  Laterality: Left;    Allergies  Allergies  Allergen Reactions   Atorvastatin Other (See Comments)    Increased LFTs    History of  Present Illness    Jeremy Waters is a PMH of hypertension, coronary artery disease, NSTEMI, atrial fibrillation, cervical radiculopathy, GERD, cardiac murmur, PVCs, left upper quadrant pain, hypothyroidism, and is status post CABG x 2.  He presented to the emergency department on 08/15/2022 and was discharged on 08/26/2022.  He noted several hours of intermittent atrial fibrillation.  He was concerned and presented to the emergency department for further evaluation.  He also noted an intermittent left shoulder and arm pain which was nonexertional.  It was felt his symptoms were musculoskeletal.  He is fairly physically active and uses his  elliptical frequently.  Using his elliptical does not exacerbate his symptoms.  He was noted to be in atrial fibrillation with RVR with a rate of 100-130 in the emergency department.  His BNP was elevated at 352.  His high-sensitivity troponin was over 2000 and he was ruled in for NSTEMI.  He underwent cardiac catheterization on 08/16/2022 and was noted to have multivessel coronary disease.  Cardiothoracic surgery was consulted and he underwent CABG x 2 (LIMA-LAD, SVG-RCA), aortic valve replacement.  He also underwent Maze procedure and left atrial appendage clipping.  He was extubated on postop day 1.  He was started on gentle diuresis for mild fluid volume overload.  He developed atrial fibrillation with RVR.  He was placed on amiodarone and he briefly converted back to sinus rhythm but went right back into atrial fibrillation.  He responded well to IV diuresis.  On postoperative day 3 he again converted to sinus rhythm and then back to atrial fibrillation.  Due to bradycardia he remained attached to pacing wires.  He remained at his baseline bradycardia and tolerated this without difficulty.  His pacing wires were removed without difficulty.  His surgical incisions continue to heal well without signs of infection.  He was discharged on 08/26/2022.  He presents to the clinic today for follow-up evaluation and states he has had some right shoulder pain.  He saw his PCP and was prescribed prednisone.  He continues to heal well post CABG.  We reviewed his hospitalization and surgeries.  He and his wife expressed understanding.  He has been walking 10 to 15 minutes 3 times per day.  He has upcoming appointment with cardiothoracic surgery.  We reviewed his current medications.  He has had 2 episodes of accelerated heartbeat that were brief.  His EKG today shows normal sinus rhythm incomplete right bundle branch block 60 bpm.  I will continue his current medication regimen, give salty 6 diet sheet, high-fiber diet  instructions, have him maintain sternal precautions and slowly increase his physical activity.  We will repeat his fasting lipids and LFTs in 8 weeks and plan follow-up in 3 to 4 months.  Today he denies chest pain, shortness of breath, lower extremity edema, fatigue, palpitations, melena, hematuria, hemoptysis, diaphoresis, weakness, presyncope, syncope, orthopnea, and PND.    Home Medications    Prior to Admission medications   Medication Sig Start Date End Date Taking? Authorizing Provider  aspirin EC 81 MG tablet Take 1 tablet (81 mg total) by mouth daily. 08/26/22   Barrett, Erin R, PA-C  beta carotene 16109 UNIT capsule Take 25,000 Units by mouth daily.    [provider]  clonazePAM (KLONOPIN) 0.5 MG tablet TAKE ONE TABLET BY MOUTH AT BEDTIME AS NEEDED Patient taking differently: Take 0.5 mg by mouth at bedtime as needed (Sleep). 09/07/21   Pincus Sanes, MD  clopidogrel (PLAVIX) 75 MG tablet Take  1 tablet (75 mg total) by mouth daily. 08/26/22 08/26/23  Barrett, Erin R, PA-C  fluticasone (FLONASE) 50 MCG/ACT nasal spray SPRAY ONE SPRAY IN EACH NOSTRIL ONCE DAILY Patient taking differently: Place 1 spray into both nostrils daily. 08/09/22   Pincus Sanes, MD  furosemide (LASIX) 20 MG tablet Take 1 tablet (20 mg total) by mouth daily. X 3 days, then use daily as needed for weight gain of 3-5 lbs in 24 hr time period 08/26/22   Barrett, Erin R, PA-C  glycopyrrolate (ROBINUL) 2 MG tablet Take 1 tablet (2 mg total) by mouth every morning. 08/11/22   Esterwood, Amy S, PA-C  levothyroxine (SYNTHROID) 50 MCG tablet Take 1 tablet by mouth daily 02/03/22   Pincus Sanes, MD  midodrine (PROAMATINE) 10 MG tablet Take 1 tablet (10 mg total) by mouth 3 (three) times daily with meals. X 7 days, then decrease to 1 tab BID x 7 days, then decrease to 10 mg daily x 7 days then stop 08/26/22   Barrett, Erin R, PA-C  potassium chloride (KLOR-CON M) 10 MEQ tablet Take 1 tablet (10 mEq total) by mouth daily. X 3  days, then daily as needed on days you take Lasix 08/26/22   Barrett, Erin R, PA-C  pyridOXINE (VITAMIN B-6) 100 MG tablet Take 100 mg by mouth daily.    [provider]  rosuvastatin (CRESTOR) 40 MG tablet Take 1 tablet (40 mg total) by mouth daily. 03/18/22   Pincus Sanes, MD  traMADol (ULTRAM) 50 MG tablet Take 1 tablet (50 mg total) by mouth every 6 (six) hours as needed for moderate pain. 08/26/22   Barrett, Erin R, PA-C  vitamin C (ASCORBIC ACID) 500 MG tablet Take 500 mg by mouth 2 (two) times daily.    [provider]  vitamin E 400 UNIT capsule Take 400 Units by mouth daily.    [provider]    Family History    Family History  Problem Relation Age of Onset   Lung cancer Mother    COPD Mother    Cancer Father        Bladder Cancer   Prostate cancer Father    Colon cancer Father 46   Urolithiasis Father    Lung cancer Sister    Alcohol abuse Paternal Uncle    Stroke Paternal Uncle 99   Hypertension Paternal Uncle    Asthma Neg Hx    Heart disease Neg Hx    Esophageal cancer Neg Hx    Stomach cancer Neg Hx    Liver disease Neg Hx    Colon polyps Neg Hx    Rectal cancer Neg Hx    He indicated that his mother is deceased. He indicated that his father is deceased. He indicated that one of his two sisters is deceased. He indicated that the status of his neg hx is unknown.  Social History    Social History   Socioeconomic History   Marital status: Married    Spouse name: Not on file   Number of children: 2   Years of education: Not on file   Highest education level: Not on file  Occupational History   Occupation: insur exe    Employer: [employer]  Tobacco Use   Smoking status: Former   Smokeless tobacco: Never   Tobacco comments:    stopped smoking cigarettes 1983, 1 cigar /day 2001-2013  Vaping Use   Vaping Use: Never used  Substance and Sexual Activity  Alcohol use: Yes    Alcohol/week: 7.0 - 14.0 standard drinks of  alcohol    Types: 7 - 14 Standard drinks or equivalent per week    Comment: scotch or wine daily   Drug use: No   Sexual activity: Yes  Other Topics Concern   Not on file  Social History Narrative   Exercises daily            Social Determinants of Health   Financial Resource Strain: Low Risk  (03/18/2022)   Overall Financial Resource Strain (CARDIA)    Difficulty of Paying Living Expenses: Not hard at all  Food Insecurity: No Food Insecurity (08/15/2022)   Hunger Vital Sign    Worried About Running Out of Food in the Last Year: Never true    Ran Out of Food in the Last Year: Never true  Transportation Needs: No Transportation Needs (08/30/2022)   PRAPARE - Administrator, Civil Service (Medical): No    Lack of Transportation (Non-Medical): No  Physical Activity: Sufficiently Active (03/18/2022)   Exercise Vital Sign    Days of Exercise per Week: 5 days    Minutes of Exercise per Session: 30 min  Stress: No Stress Concern Present (03/18/2022)   [employer] of Occupational Health - Occupational Stress Questionnaire    Feeling of Stress : Not at all  Social Connections: Socially Integrated (03/18/2022)   Social Connection and Isolation Panel [NHANES]    Frequency of Communication with Friends and Family: More than three times a week    Frequency of Social Gatherings with Friends and Family: Three times a week    Attends Religious Services: 1 to 4 times per year    Active Member of Clubs or Organizations: Yes    Attends Banker Meetings: 1 to 4 times per year    Marital Status: Married  Catering manager Violence: Not At Risk (08/15/2022)   Humiliation, Afraid, Rape, and Kick questionnaire    Fear of Current or Ex-Partner: No    Emotionally Abused: No    Physically Abused: No    Sexually Abused: No     Review of Systems    General:  No chills, fever, night sweats or weight changes.  Cardiovascular:  No chest pain, dyspnea on exertion, edema,  orthopnea, palpitations, paroxysmal nocturnal dyspnea. Dermatological: No rash, lesions/masses Respiratory: No cough, dyspnea Urologic: No hematuria, dysuria Abdominal:   No nausea, vomiting, diarrhea, bright red blood per rectum, melena, or hematemesis Neurologic:  No visual changes, wkns, changes in mental status. All other systems reviewed and are otherwise negative except as noted above.  Physical Exam    VS:  BP 124/72   Pulse 60   Ht 5\' 7"  (1.702 m)   Wt 146 lb 12.8 oz (66.6 kg)   SpO2 98%   BMI 22.99 kg/m  , BMI Body mass index is 22.99 kg/m. GEN: Well nourished, well developed, in no acute distress. HEENT: normal. Neck: Supple, no JVD, carotid bruits, or masses. Cardiac: RRR, no murmurs, rubs, or gallops. No clubbing, cyanosis, edema.  Radials/DP/PT 2+ and equal bilaterally.  Respiratory:  Respirations regular and unlabored, clear to auscultation bilaterally. GI: Soft, nontender, nondistended, BS + x 4. MS: no deformity or atrophy. Skin: warm and dry, no rash. Neuro:  Strength and sensation are intact. Psych: Normal affect.  Accessory Clinical Findings    Recent Labs: 08/15/2022: ALT 24; B Natriuretic Peptide 352.2; TSH 1.348 08/25/2022: Hemoglobin 9.6; Magnesium 2.1; Platelets 272 08/26/2022: BUN 13;  Creatinine, Ser 1.36; Potassium 3.9; Sodium 137   Recent Lipid Panel    Component Value Date/Time   CHOL 159 08/17/2022 0605   CHOL 224 (H) 09/02/2014 0919   TRIG 63 08/17/2022 0605   TRIG 116 09/02/2014 0919   HDL 61 08/17/2022 0605   HDL 76 09/02/2014 0919   CHOLHDL 2.6 08/17/2022 0605   VLDL 13 08/17/2022 0605   LDLCALC 85 08/17/2022 0605   LDLCALC 125 (H) 09/02/2014 0919   LDLDIRECT 179.6 01/04/2013 0740         ECG personally reviewed by me today-normal sinus rhythm incomplete right bundle branch block 60 bpm- No acute changes  Echocardiogram 08/23/2022  IMPRESSIONS     1. Basal inferior,basala lateral hypokinesis.. Left ventricular ejection   fraction, by estimation, is 55 to 60%. The left ventricle has normal  function. Left ventricular diastolic parameters were normal.   2. Right ventricular systolic function is normal. The right ventricular  size is normal.   3. Left atrial size was mildly dilated.   4. Mild mitral valve regurgitation.   5. S/P AVR (23 mm INSPIRIS valve, procedure date 08/18/22). Peak and mean  gradients through the valve are 19 and 10 mm HG respectively. . The aortic  valve has been repaired/replaced. Aortic valve regurgitation is not  visualized.   6. The inferior vena cava is normal in size with <50% respiratory  variability, suggesting right atrial pressure of 8 mmHg.   FINDINGS   Left Ventricle: Basal inferior,basala lateral hypokinesis. Left  ventricular ejection fraction, by estimation, is 55 to 60%. The left  ventricle has normal function. The left ventricular internal cavity size  was normal in size. There is no left  ventricular hypertrophy. Left ventricular diastolic parameters were  normal.   Right Ventricle: The right ventricular size is normal. Right vetricular  wall thickness was not assessed. Right ventricular systolic function is  normal.   Left Atrium: Left atrial size was mildly dilated.   Right Atrium: Right atrial size was normal in size.   Pericardium: Trivial pericardial effusion is present.   Mitral Valve: There is mild thickening of the mitral valve leaflet(s).  Mild mitral valve regurgitation.   Tricuspid Valve: The tricuspid valve is normal in structure. Tricuspid  valve regurgitation is trivial.   Aortic Valve: S/P AVR (23 mm INSPIRIS valve, procedure date 08/18/22). Peak  and mean gradients through the valve are 19 and 10 mm HG respectively. The  aortic valve has been repaired/replaced. Aortic valve regurgitation is not  visualized. Aortic valve mean   gradient measures 9.5 mmHg. Aortic valve peak gradient measures 18.6  mmHg. Aortic valve area, by VTI measures  1.62 cm.   Pulmonic Valve: The pulmonic valve was thickened with good excursion.  Pulmonic valve regurgitation is trivial.   Aorta: The aortic root is normal in size and structure.   Venous: The inferior vena cava is normal in size with less than 50%  respiratory variability, suggesting right atrial pressure of 8 mmHg.   IAS/Shunts: No atrial level shunt detected by color flow Doppler.    Assessment & Plan   1.  NSTEMI, CABG x 2-presented to the emergency department with intermittent atrial fibrillation and underwent cardiac catheterization.  He was noted to have multivessel coronary disease.  Underwent CABG x 2 with AVR 08/18/2022.  At that time he also had left atrial appendage clipping and maze procedure. Continue sternal precautions Continue aspirin, rosuvastatin, Plavix  Hyperlipidemia-LDL 85 on 08/17/2022 Heart healthy low-sodium high-fiber diet  Continue aspirin, rosuvastatin Repeat fasting lipids in 8 weeks  Atrial fibrillation-EKG today shows normal sinus rhythm incomplete right bundle branch block 60 bpm.  Underwent maze, left atrial appendage clipping 08/18/2022.  Had intermittent episodes of atrial fibrillation postoperatively. Continue aspirin, Plavix Avoid triggers caffeine, chocolate, EtOH, dehydration etc.  PVCs-denies episodes of irregular or accelerated heartbeat.  Denies fatigue.  Slowly increasing physical activity.  Hypothyroidism-TSH 1.348 on 08/15/2022. Continue current medical therapy  Disposition: Follow-up with Dr.Berry in 3-4 months.   Thomasene Ripple. Mayce Noyes NP-C     09/07/2022, 2:34 PM [facility] [address] Office [phone] Fax [phone]    I spent 14 minutes examining this patient, reviewing medications, and using patient centered shared decision making involving her cardiac care.  Prior to her visit I spent greater than 20 minutes reviewing her past medical history,  medications, and prior cardiac  tests.

## 2022-09-06 ENCOUNTER — Encounter: Payer: Self-pay | Admitting: *Deleted

## 2022-09-06 ENCOUNTER — Telehealth: Payer: Self-pay | Admitting: *Deleted

## 2022-09-06 ENCOUNTER — Ambulatory Visit (INDEPENDENT_AMBULATORY_CARE_PROVIDER_SITE_OTHER): Payer: PPO

## 2022-09-06 ENCOUNTER — Ambulatory Visit (INDEPENDENT_AMBULATORY_CARE_PROVIDER_SITE_OTHER): Payer: PPO | Admitting: Family Medicine

## 2022-09-06 ENCOUNTER — Encounter: Payer: Self-pay | Admitting: Family Medicine

## 2022-09-06 VITALS — BP 104/66 | HR 62 | Resp 20 | Ht 67.0 in | Wt 146.0 lb

## 2022-09-06 DIAGNOSIS — M25611 Stiffness of right shoulder, not elsewhere classified: Secondary | ICD-10-CM | POA: Diagnosis not present

## 2022-09-06 DIAGNOSIS — M25511 Pain in right shoulder: Secondary | ICD-10-CM | POA: Diagnosis not present

## 2022-09-06 MED ORDER — PREDNISONE 10 MG (21) PO TBPK
ORAL_TABLET | ORAL | 0 refills | Status: DC
Start: 2022-09-06 — End: 2022-09-21

## 2022-09-06 NOTE — Progress Notes (Signed)
Assessment & Plan:  1-2. Acute pain of right shoulder/Decreased ROM of right shoulder Education provided on shoulder pain.  Encouraged use of heating pad, Biofreeze and/or Lidoderm patch.  Advised to avoid all NSAIDs due to cardiac history.  May continue tramadol as needed for pain. - predniSONE (STERAPRED UNI-PAK 21 TAB) 10 MG (21) TBPK tablet; As directed x 6 days  Dispense: 21 tablet; Refill: 0 - DG Shoulder Right; Future - Ambulatory referral to Physical Therapy   Follow up plan: Return if symptoms worsen or fail to improve.  Deliah Boston, MSN, APRN, FNP-C  Subjective:  HPI: Jeremy Waters is a 77 y.o. male presenting on 09/06/2022 for right shoulder pain (Decreased ROM in right shoulder. Pain has been there for 2 days. No known injury. Pt did have a recent cardiac procedure)  Patient is accompanied by his wife, who he is okay with being present.  Shoulder Pain: Patient complaints of right shoulder pain. No known injury. He has been using a rolling walker since being discharged from the hospital a week ago, which is new for him. The pain is described as shooting and gripping .  He rates the pain 8-10/10. The onset of the pain was  two days ago .  No history of dislocation. Symptoms are aggravated by all activities. Symptoms are diminished by  rest and Tramadol., which cuts pain a little bit, but not much.     ROS: Negative unless specifically indicated above in HPI.   Relevant past medical history reviewed and updated as indicated.   Allergies and medications reviewed and updated.   Current Outpatient Medications:    aspirin EC 81 MG tablet, Take 1 tablet (81 mg total) by mouth daily., Disp: , Rfl:    beta carotene 16109 UNIT capsule, Take 25,000 Units by mouth daily., Disp: , Rfl:    clonazePAM (KLONOPIN) 0.5 MG tablet, TAKE ONE TABLET BY MOUTH AT BEDTIME AS NEEDED (Patient taking differently: Take 0.5 mg by mouth at bedtime as needed (Sleep).), Disp: 30 tablet, Rfl: 0    clopidogrel (PLAVIX) 75 MG tablet, Take 1 tablet (75 mg total) by mouth daily., Disp: 30 tablet, Rfl: 11   fluticasone (FLONASE) 50 MCG/ACT nasal spray, SPRAY ONE SPRAY IN EACH NOSTRIL ONCE DAILY (Patient taking differently: Place 1 spray into both nostrils daily.), Disp: 16 mL, Rfl: 3   furosemide (LASIX) 20 MG tablet, Take 1 tablet (20 mg total) by mouth daily. X 3 days, then use daily as needed for weight gain of 3-5 lbs in 24 hr time period, Disp: 30 tablet, Rfl: 1   glycopyrrolate (ROBINUL) 2 MG tablet, Take 1 tablet (2 mg total) by mouth every morning., Disp: 90 tablet, Rfl: 0   levothyroxine (SYNTHROID) 50 MCG tablet, Take 1 tablet by mouth daily, Disp: 90 tablet, Rfl: 2   midodrine (PROAMATINE) 10 MG tablet, Take 1 tablet (10 mg total) by mouth 3 (three) times daily with meals. X 7 days, then decrease to 1 tab BID x 7 days, then decrease to 10 mg daily x 7 days then stop, Disp: 90 tablet, Rfl: 1   potassium chloride (KLOR-CON M) 10 MEQ tablet, Take 1 tablet (10 mEq total) by mouth daily. X 3 days, then daily as needed on days you take Lasix, Disp: 30 tablet, Rfl: 1   pyridOXINE (VITAMIN B-6) 100 MG tablet, Take 100 mg by mouth daily., Disp: , Rfl:    rosuvastatin (CRESTOR) 40 MG tablet, Take 1 tablet (40 mg total) by mouth  daily., Disp: 30 tablet, Rfl: 5   traMADol (ULTRAM) 50 MG tablet, Take 1 tablet (50 mg total) by mouth every 6 (six) hours as needed for moderate pain., Disp: 30 tablet, Rfl: 0   vitamin C (ASCORBIC ACID) 500 MG tablet, Take 500 mg by mouth 2 (two) times daily., Disp: , Rfl:    vitamin E 400 UNIT capsule, Take 400 Units by mouth daily., Disp: , Rfl:   Allergies  Allergen Reactions   Atorvastatin Other (See Comments)    Increased LFTs    Objective:   BP 104/66   Pulse 62   Resp 20   Ht 5\' 7"  (1.702 m)   Wt 146 lb (66.2 kg)   BMI 22.87 kg/m    Physical Exam Vitals reviewed.  Constitutional:      General: He is not in acute distress.    Appearance: Normal  appearance. He is not ill-appearing, toxic-appearing or diaphoretic.  HENT:     Head: Normocephalic and atraumatic.  Eyes:     General: No scleral icterus.       Right eye: No discharge.        Left eye: No discharge.     Conjunctiva/sclera: Conjunctivae normal.  Cardiovascular:     Rate and Rhythm: Normal rate.  Pulmonary:     Effort: Pulmonary effort is normal. No respiratory distress.  Musculoskeletal:     Right shoulder: Tenderness and bony tenderness present. No swelling, deformity, laceration or crepitus. Decreased range of motion (can only abduct to 20 degrees, pain with extension, flexion, and abduction). Normal strength. Normal pulse.     Cervical back: Normal range of motion.  Skin:    General: Skin is warm and dry.  Neurological:     Mental Status: He is alert and oriented to person, place, and time. Mental status is at baseline.  Psychiatric:        Mood and Affect: Mood normal.        Behavior: Behavior normal.        Thought Content: Thought content normal.        Judgment: Judgment normal.

## 2022-09-06 NOTE — Patient Outreach (Signed)
  Care Coordination   Post-TOC Care Coordination Telephone  Visit Note   09/06/2022 Name: Jeremy Waters MRN: [id number] DOB: [birth year]  Jeremy Waters is a 77 y.o. year old male who sees Burns, Bobette Mo, MD for primary care. I spoke with  Jeremy Waters by phone today.  Patient called me this morning and left voice message requesting call-back; call was successfully completed within 20 minutes of his call to me  What matters to the patients health and wellness today?  "I don't know what happened, but I was out walking this weekend and when I returned home, I sat down and had this terrible pain develop in my right upper arm.  It hurts really bad and came out of nowhere.  I never take pain medicines, but I had to, it helped only a little bit.  Sometimes I can get the arm in a certain position and it helps, but it is constant pain and it is bad pain.  I can't move my arm hardly at all.  There is no swelling or anything that you can see- it is all internal pain.  I will do as you suggest and get an urgent appointment with Dr. Lawerance Bach today if I can"   Verified prior to signing chart that patient was able to schedule urgent/ same appointment at PCP office for today, 09/06/22 at 10:40 am   Goals Addressed             This Visit's Progress    Care Coordination Activities: acute call       Interventions Today    Flowsheet Row Most Recent Value  Chronic Disease   Chronic disease during today's visit Other  [acute pain in (R) UE post-recent surgery]  General Interventions   General Interventions Discussed/Reviewed General Interventions Discussed, Doctor Visits  [triage around development of acute (R) UE pain/ immobility,  pain assessment completed]  Doctor Visits Discussed/Reviewed Doctor Visits Discussed, Specialist, PCP  PCP/Specialist Visits Compliance with follow-up visit  Education Interventions   Education Provided Provided Education  Provided Verbal Education On When to see the doctor,  Other  [most appropriate health care setting to have acute pain assessed]             SDOH assessments and interventions completed:  No Previously completed at time of 08/30/22 TOC call- verified no changes  Care Coordination Interventions:  Yes, provided   Follow up plan: Follow up call scheduled for telephone visit with RN CM Care Coordinator on 09/14/22    Encounter Outcome:  Pt. Visit Completed   Caryl Pina, RN, BSN, CCRN Alumnus RN CM Care Coordination/ Transition of Care- [facility] Care Management [phone]: direct office

## 2022-09-07 ENCOUNTER — Encounter: Payer: Self-pay | Admitting: Internal Medicine

## 2022-09-07 ENCOUNTER — Other Ambulatory Visit: Payer: Self-pay | Admitting: Cardiothoracic Surgery

## 2022-09-07 ENCOUNTER — Encounter: Payer: Self-pay | Admitting: General Practice

## 2022-09-07 ENCOUNTER — Ambulatory Visit: Payer: PPO | Attending: General Practice | Admitting: General Practice

## 2022-09-07 VITALS — BP 124/72 | HR 60 | Ht 67.0 in | Wt 146.8 lb

## 2022-09-07 DIAGNOSIS — E782 Mixed hyperlipidemia: Secondary | ICD-10-CM | POA: Diagnosis not present

## 2022-09-07 DIAGNOSIS — I493 Ventricular premature depolarization: Secondary | ICD-10-CM

## 2022-09-07 DIAGNOSIS — Z952 Presence of prosthetic heart valve: Secondary | ICD-10-CM

## 2022-09-07 DIAGNOSIS — Z951 Presence of aortocoronary bypass graft: Secondary | ICD-10-CM | POA: Diagnosis not present

## 2022-09-07 DIAGNOSIS — E038 Other specified hypothyroidism: Secondary | ICD-10-CM

## 2022-09-07 NOTE — Patient Instructions (Addendum)
Medication Instructions:  The current medical regimen is effective;  continue present plan and medications as directed. Please refer to the Current Medication list given to you today.  *If you need a refill on your cardiac medications before your next appointment, please call your pharmacy*  Lab Work: FASTING LIPID AND LFT IN 8 WEEKS If you have labs (blood work) drawn today and your tests are completely normal, you will receive your results only by: MyChart Message (if you have MyChart) OR  A paper copy in the mail If you have any lab test that is abnormal or we need to change your treatment, we will call you to review the results.  Other Instructions PLEASE CONTINUE STERNAL PRECAUTIONS  SLOWLY INCREASE PHYSICAL ACTIVITY   PLEASE READ AND FOLLOW INCREASED FIBER DIET ATTACHED  SLOWLY INCREASE PHYSICAL ACTIVITY  PLEASE READ AND FOLLOW ATTACHED  SALTY 6   Follow-Up: At [facility], you and your health needs are our priority.  As part of our continuing mission to provide you with exceptional heart care, we have created designated Provider Care Teams.  These Care Teams include your primary Cardiologist (physician) and Advanced Practice Providers (APPs -  Physician Assistants and Nurse Practitioners) who all work together to provide you with the care you need, when you need it.  Your next appointment:   3-4 month(s)  Provider:   Maisie Fus, MD         High-Fiber Eating Plan Fiber, also called dietary fiber, is a type of carbohydrate. It is found foods such as fruits, vegetables, whole grains, and beans. A high-fiber diet can have many health benefits. Your health care provider may recommend a high-fiber diet to help: Prevent constipation. Fiber can make your bowel movements more regular. Lower your cholesterol. Relieve the following conditions: Inflammation of veins in the anus (hemorrhoids). Inflammation of specific areas of the digestive tract (uncomplicated  diverticulosis). A problem of the large intestine, also called the colon, that sometimes causes pain and diarrhea (irritable bowel syndrome, or IBS). Prevent overeating as part of a weight-loss plan. Prevent heart disease, type 2 diabetes, and certain cancers. What are tips for following this plan? Reading food labels  Check the nutrition facts label on food products for the amount of dietary fiber. Choose foods that have 5 grams of fiber or more per serving. Shopping Choose whole fruits and vegetables instead of processed forms, such as apple juice or applesauce. Choose a wide variety of high-fiber foods such as avocados, lentils, oats, and kidney beans. Read the nutrition facts label of the foods you choose. Be aware of foods with added fiber. These foods often have high sugar and sodium amounts per serving. Cooking Use whole-grain flour for baking and cooking. Cook with brown rice instead of white rice. Meal planning Start the day with a breakfast that is high in fiber, such as a cereal that contains 5 g of fiber or more per serving. Eat breads and cereals that are made with whole-grain flour instead of refined flour or white flour. Eat brown rice, bulgur wheat, or millet instead of white rice. Use beans in place of meat in soups, salads, and pasta dishes. Be sure that half of the grains you eat each day are whole grains. General information You can get the recommended daily intake of dietary fiber by: Eating a variety of fruits, vegetables, grains, nuts, and beans. Taking a fiber supplement if you are not able to take in enough fiber in your diet. It is  better to get fiber through food than from a supplement. Gradually increase how much fiber you consume. If you increase your intake of dietary fiber too quickly, you may have bloating, cramping, or gas. Drink plenty of water to help you digest fiber. Choose high-fiber snacks, such as berries, raw vegetables, nuts, and popcorn. What  foods should I eat? Fruits Berries. Pears. Apples. Oranges. Avocado. Prunes and raisins. Dried figs. Vegetables Sweet potatoes. Spinach. Kale. Artichokes. Cabbage. Broccoli. Cauliflower. Green peas. Carrots. Squash. Grains Whole-grain breads. Multigrain cereal. Oats and oatmeal. Brown rice. Barley. Bulgur wheat. Millet. Quinoa. Bran muffins. Popcorn. Rye wafer crackers. Meats and other proteins Navy beans, kidney beans, and pinto beans. Soybeans. Split peas. Lentils. Nuts and seeds. Dairy Fiber-fortified yogurt. Beverages Fiber-fortified soy milk. Fiber-fortified orange juice. Other foods Fiber bars. The items listed above may not be a complete list of recommended foods and beverages. Contact a dietitian for more information. What foods should I avoid? Fruits Fruit juice. Cooked, strained fruit. Vegetables Fried potatoes. Canned vegetables. Well-cooked vegetables. Grains White bread. Pasta made with refined flour. White rice. Meats and other proteins Fatty cuts of meat. Fried chicken or fried fish. Dairy Milk. Yogurt. Cream cheese. Sour cream. Fats and oils Butters. Beverages Soft drinks. Other foods Cakes and pastries. The items listed above may not be a complete list of foods and beverages to avoid. Talk with your dietitian about what choices are best for you. Summary Fiber is a type of carbohydrate. It is found in foods such as fruits, vegetables, whole grains, and beans. A high-fiber diet has many benefits. It can help to prevent constipation, lower blood cholesterol, aid weight loss, and reduce your risk of heart disease, diabetes, and certain cancers. Increase your intake of fiber gradually. Increasing fiber too quickly may cause cramping, bloating, and gas. Drink plenty of water while you increase the amount of fiber you consume. The best sources of fiber include whole fruits and vegetables, whole grains, nuts, seeds, and beans. This information is not intended to  replace advice given to you by your health care provider. Make sure you discuss any questions you have with your health care provider. Document Revised: 09/13/2019 Document Reviewed: 09/13/2019 Elsevier Patient Education  2023 [employer].

## 2022-09-08 NOTE — Addendum Note (Signed)
Addended by: Brunetta Genera on: 09/08/2022 02:08 PM   Modules accepted: Orders

## 2022-09-09 ENCOUNTER — Ambulatory Visit (INDEPENDENT_AMBULATORY_CARE_PROVIDER_SITE_OTHER): Payer: Self-pay | Admitting: Cardiothoracic Surgery

## 2022-09-09 ENCOUNTER — Ambulatory Visit
Admission: RE | Admit: 2022-09-09 | Discharge: 2022-09-09 | Disposition: A | Discharge status: Home / Self Care | Payer: PPO | Source: Ambulatory Visit | Attending: Cardiothoracic Surgery | Admitting: Cardiothoracic Surgery

## 2022-09-09 VITALS — BP 95/60 | HR 60 | Resp 20 | Ht 67.0 in | Wt 144.0 lb

## 2022-09-09 DIAGNOSIS — I517 Cardiomegaly: Secondary | ICD-10-CM | POA: Diagnosis not present

## 2022-09-09 DIAGNOSIS — J90 Pleural effusion, not elsewhere classified: Secondary | ICD-10-CM | POA: Diagnosis not present

## 2022-09-09 DIAGNOSIS — Z952 Presence of prosthetic heart valve: Secondary | ICD-10-CM

## 2022-09-09 DIAGNOSIS — Z951 Presence of aortocoronary bypass graft: Secondary | ICD-10-CM | POA: Diagnosis not present

## 2022-09-09 NOTE — Therapy (Signed)
OUTPATIENT PHYSICAL THERAPY SHOULDER EVALUATION   Patient Name: Jeremy Waters MRN: [id number] DOB:11-05-1945, 77 y.o., male Today's Date: 09/10/2022  END OF SESSION:  PT End of Session - 09/10/22 0809     Visit Number 1    Number of Visits 7    Date for PT Re-Evaluation 10/29/22    Authorization Type [employer]    PT Start Time 0803    PT Stop Time 0845    PT Time Calculation (min) 42 min    Activity Tolerance Patient tolerated treatment well    Behavior During Therapy [facility] for tasks assessed/performed             Past Medical History:  Diagnosis Date   Adenomatous colon polyp 09/2007   Arthritis    OA / PAIN LEFT KNEE   Cancer    prostate cancer   Cataract    removed both eyes   COVID-19 virus infection    Diverticulosis of colon    w/o hemorrage   Heart murmur    TOLD HE HAS A SLIGHT MURMUR   Hyperlipidemia    Inguinal hernia    left side    Thyroid disease    Past Surgical History:  Procedure Laterality Date   AORTIC VALVE REPLACEMENT N/A 08/18/2022   Procedure: AORTIC VALVE REPLACEMENT (AVR) USING EDWARDS INSPIRIS AORTIC VALVE SIZE ;  Surgeon: Lyn Hollingshead, MD;  Location: [facility] OR;  Service: Open Heart Surgery;  Laterality: N/A;   BACK SURGERY  05/12/2005   L5   CLIPPING OF ATRIAL APPENDAGE N/A 08/18/2022   Procedure: CLIPPING OF ATRIAL APPENDAGE USING ATRICURE 45 ATRICLIP;  Surgeon: Lyn Hollingshead, MD;  Location: [facility] OR;  Service: Open Heart Surgery;  Laterality: N/A;  Median Sternotomy   COLONOSCOPY  1999   Negative; Dr Russella Dar   COLONOSCOPY  2004   tics, hemorrhoids   COLONOSCOPY  2009   polyps (T.A.)   CORONARY ARTERY BYPASS GRAFT N/A 08/18/2022   Procedure: CORONARY ARTERY BYPASS GRAFTING (CABG) X2 USING LEFT INTERNAL MAMMARY ARTERY AND LEFT ENDOSCOPICALLY HARVESTED GREATER SAPHENOUS VEIN.;  Surgeon: Lyn Hollingshead, MD;  Location: [facility] OR;  Service: Open Heart Surgery;  Laterality: N/A;   HERNIA REPAIR  1987   HERNIA REPAIR  10/2003    KNEE SURGERY  1969   Patellar fracture fragment Lt   LAMINECTOMY  2000   L4-5   LEFT HEART CATH AND CORONARY ANGIOGRAPHY N/A 08/16/2022   Procedure: LEFT HEART CATH AND CORONARY ANGIOGRAPHY;  Surgeon: Orbie Pyo, MD;  Location: [facility] INVASIVE CV LAB;  Service: Cardiovascular;  Laterality: N/A;   LUMBAR LAMINECTOMY/DECOMPRESSION MICRODISCECTOMY Right 10/08/2015   Procedure: MICRO LUMBAR DECOMPRESSION L4-L5 AND L5-S1 ON RIGHT ;  Surgeon: Jene Every, MD;  Location: [facility] ORS;  Service: Orthopedics;  Laterality: Right;   MASS EXCISION  04/26/2011   Procedure: MINOR EXCISION OF MASS;  Surgeon: Velora Heckler, MD;  Location: [facility];  Service: General;  Laterality: Right;  Excise subcutaneous mass right axilla Minor Room   MAZE N/A 08/18/2022   Procedure: MAZE USING ATRICURE Oralia Manis;  Surgeon: Lyn Hollingshead, MD;  Location: [facility] OR;  Service: Open Heart Surgery;  Laterality: N/A;   POLYPECTOMY     PROSTATECTOMY  04/16/2005   TEE WITHOUT CARDIOVERSION N/A 08/18/2022   Procedure: TRANSESOPHAGEAL ECHOCARDIOGRAM;  Surgeon: Lyn Hollingshead, MD;  Location: [facility] OR;  Service: Open Heart Surgery;  Laterality: N/A;   TONSILLECTOMY     TOTAL KNEE  ARTHROPLASTY Left 07/03/2013   Procedure: LEFT TOTAL KNEE ARTHROPLASTY;  Surgeon: Shelda Pal, MD;  Location: [facility] ORS;  Service: Orthopedics;  Laterality: Left;   Patient Active Problem List   Diagnosis Date Noted   Pleural effusion 08/25/2022   Localized edema 08/25/2022   Atrial fibrillation with RVR 08/25/2022   S/P aortic valve replacement 08/18/2022   Hypotension 08/18/2022   S/P CABG x 2 08/18/2022   On mechanically assisted ventilation 08/18/2022   NSTEMI (non-ST elevated myocardial infarction) 08/15/2022   Atrial fibrillation 08/15/2022   Left arm pain 07/29/2022   Cervical radiculopathy 07/29/2022   GERD (gastroesophageal reflux disease) 03/18/2022   Murmur 02/11/2021   Premature beats 02/11/2021   Frequent PVCs  02/11/2021   LUQ pain 01/16/2019   Right cervical radiculopathy 03/03/2017   Spinal stenosis, lumbar 10/08/2015   Sensorineural hearing loss of both ears 04/10/2014   Organic impotence 04/10/2014   Hyperglycemia 04/02/2014   S/P left TKA 07/03/2013   Left knee DJD 06/07/2013   Hypothyroidism 03/03/2012   Axillary mass, right 04/07/2011   ROSACEA 03/19/2010   ARTHRALGIA 03/19/2010   CERVICALGIA 09/05/2009   CARPAL TUNNEL SYNDROME 06/17/2008   DEGENERATIVE JOINT DISEASE, GENERALIZED 06/17/2008   CERVICAL RADICULOPATHY, LEFT 06/17/2008   Disturbance in sleep behavior 03/04/2008   PROSTATE CANCER, HX OF 03/04/2008   Hyperlipidemia 06/15/2007   DIVERTICULOSIS, COLON W/O HEM 03/03/2007    PCP: Pincus Sanes, MD  REFERRING PROVIDER: Gwenlyn Fudge, FNP  REFERRING DIAG: M25.511 (ICD-10-CM) - Acute pain of right shoulder, M25.611 (ICD-10-CM) - Decreased ROM of right shoulder  THERAPY DIAG:  Acute pain of right shoulder  Abnormal posture  Rationale for Evaluation and Treatment: Rehabilitation  ONSET DATE: Last week  SUBJECTIVE:                                                                                                                                                                                      SUBJECTIVE STATEMENT: Pt reports developing R shoulder pain last week being unable to left his R arm due to pain. He saw his GP on 09/06/22 and was stated on predisone which has been helpful. Pt denies prior shoulder issues.   Hand dominance: Right  PERTINENT HISTORY: CABG 08/18/22  PAIN:  Are you having pain? Yes: NPRS scale: 1/10 Pain location: R shoulder Pain description: ache Aggravating factors: certain R UEmovements Relieving factors: predisone  PRECAUTIONS: Sternal  WEIGHT BEARING RESTRICTIONS: Yes don't use arms to assist with standing  FALLS:  Has patient fallen in last 6 months? No  LIVING ENVIRONMENT: Lives with: lives with their family Lives in:  House/apartment  No issue with accessing or  mobility within home  OCCUPATION: Retired  PLOF: Independent  PATIENT GOALS:Good use of my R arm  NEXT MD VISIT:   OBJECTIVE:   DIAGNOSTIC FINDINGS:  R shoulder DG 09/06/22 IMPRESSION: Slightly high-riding humeral head and mild greater tuberosity surface irregularity suggesting rotator cuff pathology.     Electronically Signed   By: Lesia Hausen M.D.   On: 09/06/2022 11:32  PATIENT SURVEYS:  FOTO: Perceived function   63%, predicted   74%   COGNITION: Overall cognitive status: Within functional limits for tasks assessed     SENSATION: WFL  POSTURE: Forward head, rounded shoulder   UPPER EXTREMITY ROM:  WNLs and equal to L Active ROM Right eval Left eval  Shoulder flexion    Shoulder extension    Shoulder abduction    Shoulder adduction    Shoulder internal rotation    Shoulder external rotation    Elbow flexion    Elbow extension    Wrist flexion    Wrist extension    Wrist ulnar deviation    Wrist radial deviation    Wrist pronation    Wrist supination    (Blank rows = not tested)  UPPER EXTREMITY MMT: Equal R to L no overt weakness MMT Right eval Left eval  Shoulder flexion    Shoulder extension    Shoulder abduction    Shoulder adduction    Shoulder internal rotation    Shoulder external rotation    Middle trapezius    Lower trapezius    Elbow flexion    Elbow extension    Wrist flexion    Wrist extension    Wrist ulnar deviation    Wrist radial deviation    Wrist pronation    Wrist supination    Grip strength (lbs)    (Blank rows = not tested)  SHOULDER SPECIAL TESTS: Impingement tests: Neer impingement test: negative Rotator cuff assessment: Empty can test: negative and Full can test: positive  for reproduction of pain Biceps assessment: Yergason's test: negative    TODAY'S TREATMENT:                                                                                                                                           [facility] Adult PT Treatment:                                                DATE: 09/10/22 Therapeutic Exercise: Developed, instructed in, and pt completed therex as noted in HEP  Self Care: Proper support of R arm with rest    PATIENT EDUCATION: Education details: Eval findings, POC, HEP, self care  Person educated: Patient and Spouse Education method: Explanation, Demonstration, Tactile cues, Verbal cues, and Handouts Education comprehension: verbalized understanding, returned demonstration, verbal  cues required, and tactile cues required  HOME EXERCISE PROGRAM: Access Code: [email or web address] URL: [email or web address] Date: 09/10/2022 Prepared by: Joellyn Rued  Exercises - Standing Shoulder Row with Anchored Resistance  - 1 x daily - 7 x weekly - 3 sets - 10 reps - 3 hold - Shoulder External Rotation with Anchored Resistance (Mirrored)  - 1 x daily - 7 x weekly - 3 sets - 10 reps - 3 hold  ASSESSMENT:  CLINICAL IMPRESSION: Patient is a 77 y.o. male who was seen today for physical therapy evaluation and treatment for M25.511 (ICD-10-CM) - Acute pain of right shoulder, M25.611 (ICD-10-CM) - Decreased ROM of right shoulder. Pt presents with improving R shoulder pain with pt currently experiencing low level pain with certain R UE movements. The pt was initiated on light UE strengthening therex to address R RC and posterior chain strength. Pt will benefit from skilled PT to address impairments to optimize function with less pain.   OBJECTIVE IMPAIRMENTS: impaired UE functional use, postural dysfunction, and pain.   ACTIVITY LIMITATIONS: carrying, lifting, and reach over head  PARTICIPATION LIMITATIONS: meal prep, cleaning, and laundry  PERSONAL FACTORS: Time since onset of injury/illness/exacerbation and 1 comorbidity: CABG 08/18/22  are also affecting patient's functional outcome.   REHAB POTENTIAL: Excellent  CLINICAL DECISION MAKING:  Stable/uncomplicated  EVALUATION COMPLEXITY: Low   GOALS:  SHORT TERM GOALS=LTGS  LONG TERM GOALS: Target date: 10/29/22  Pt will be Ind in a final HEP to maintain achieved LOF  Baseline: initiated Goal status: INITIAL  2.  Pt will report pain free movements of the R arm with daily activities Baseline: Pian c certain R arm movements Goal status: INITIAL  3.  Pt's FOTO score will improved to the predicted value of 74% as indication of improved function  Baseline: 63% Goal status: INITIAL  PLAN:  PT FREQUENCY: 1x/week  PT DURATION: 6 weeks  PLANNED INTERVENTIONS: Therapeutic exercises, Therapeutic activity, Patient/Family education, Self Care, Joint mobilization, Dry Needling, Spinal mobilization, Cryotherapy, Moist heat, Taping, Ionotophoresis /ml Dexamethasone, Manual therapy, and Re-evaluation  PLAN FOR NEXT SESSION: Review FOTO; assess response to HEP; progress therex as indicated; use of modalities, manual therapy; and TPDN as indicated.  Daryan Buell MS, PT 09/10/22 3:32 PM

## 2022-09-10 ENCOUNTER — Other Ambulatory Visit: Payer: Self-pay

## 2022-09-10 ENCOUNTER — Other Ambulatory Visit: Payer: Self-pay | Admitting: Internal Medicine

## 2022-09-10 ENCOUNTER — Ambulatory Visit: Payer: PPO | Attending: Family Medicine

## 2022-09-10 DIAGNOSIS — R293 Abnormal posture: Secondary | ICD-10-CM | POA: Diagnosis not present

## 2022-09-10 DIAGNOSIS — M25611 Stiffness of right shoulder, not elsewhere classified: Secondary | ICD-10-CM | POA: Insufficient documentation

## 2022-09-10 DIAGNOSIS — M25511 Pain in right shoulder: Secondary | ICD-10-CM | POA: Insufficient documentation

## 2022-09-14 ENCOUNTER — Other Ambulatory Visit: Payer: Self-pay | Admitting: *Deleted

## 2022-09-14 ENCOUNTER — Telehealth: Payer: Self-pay | Admitting: *Deleted

## 2022-09-14 ENCOUNTER — Encounter: Payer: Self-pay | Admitting: Internal Medicine

## 2022-09-14 ENCOUNTER — Ambulatory Visit: Payer: Self-pay

## 2022-09-14 NOTE — Progress Notes (Signed)
Ambulatory referral placed to [facility] Cardiac Rehab Program per Dr. Delia Chimes.

## 2022-09-14 NOTE — Telephone Encounter (Signed)
-----   Message from Lyn Hollingshead, MD sent at 09/13/2022  5:25 PM EDT ----- Regarding: RE: cardiac rehab That's fine he can go to rehab thanks ----- Message ----- From: Ludwig Clarks, RN Sent: 09/13/2022   5:16 PM EDT To: Lyn Hollingshead, MD Subject: cardiac rehab                                  Dr. Delia Chimes,  Mr. Klemens called inquiring about a referral for cardiac rehab. He is s/p CABG 3/27 and was seen in follow up with cxr last week, 4/18. Is the patient cleared to begin cardiac rehab or are you wanting him to wait until he follows up with a PA on 5/9?  Thanks, [employer]

## 2022-09-14 NOTE — Patient Outreach (Signed)
  Care Coordination   Initial Visit Note   09/14/2022 Name: Jeremy Waters MRN: [id number] DOB: Sep 20, 1945  Jeremy Waters is a 77 y.o. year old male who sees Burns, Bobette Mo, MD for primary care. I spoke with  Jeremy Waters by phone today.  What matters to the patients health and wellness today? Admission 08/15/22-08/26/22 NSTEMI. He reports he has followed up with providers. He is active with outpatient rehab for right shoulder pain. He reports he is awaiting to hear from cardiac rehab initial start date. He denies any questions or concerns at this time.  Goals Addressed             This Visit's Progress    continue to improve post hospitalization       Interventions Today    Flowsheet Row Most Recent Value  Chronic Disease   Chronic disease during today's visit Other  [post hospitalization NSTEMI, CABG x2, AVR, Maze]  General Interventions   General Interventions Discussed/Reviewed General Interventions Discussed, Doctor Visits  Doctor Visits Discussed/Reviewed Doctor Visits Discussed  [reviewed provider instructions post OV 09/07/22 and 09/09/22.]  PCP/Specialist Visits Compliance with follow-up visit  Exercise Interventions   Exercise Discussed/Reviewed Exercise Reviewed, Exercise Discussed  [discussed patient is active with outpatient rehab for right shoulder pain. confirmed patient has communicated with provider re: cardiac rehab and patient is awaiting call to schedule initial session]  Education Interventions   Education Provided Provided Education  Provided Verbal Education On Medication, Other, When to see the doctor  Nutrition Interventions   Nutrition Discussed/Reviewed Nutrition Discussed, Decreasing salt  [reiterated nutriton handout provided by provider 09/07/22 visit.]  Pharmacy Interventions   Pharmacy Dicussed/Reviewed Pharmacy Topics Discussed, Medication Adherence, Affording Medications, Medications and their functions            SDOH assessments and  interventions completed:  Yes  SDOH Interventions Today    Flowsheet Row Most Recent Value  SDOH Interventions   Food Insecurity Interventions Intervention Not Indicated  Housing Interventions Intervention Not Indicated  Transportation Interventions Intervention Not Indicated  Utilities Interventions Intervention Not Indicated     Care Coordination Interventions:  Yes, provided   Follow up plan: Follow up call scheduled for 10/12/22    Encounter Outcome:  Pt. Visit Completed   Kathyrn Sheriff, RN, MSN, BSN, CCM [facility] Care Coordinator [phone]

## 2022-09-14 NOTE — Therapy (Addendum)
OUTPATIENT PHYSICAL THERAPY TREATMENT NOTE/Discharge   Patient Name: Jeremy Waters MRN: [id number] DOB:May 10, 1946, 77 y.o., male Today's Date: 09/15/2022  PCP: Pincus Sanes, MD   REFERRING PROVIDER: Gwenlyn Fudge, FNP   END OF SESSION:   PT End of Session - 09/15/22 0856     Visit Number 2    Number of Visits 7    Date for PT Re-Evaluation 10/29/22    Authorization Type [employer]    PT Start Time 0853    PT Stop Time 0925    PT Time Calculation (min) 32 min    Activity Tolerance Patient tolerated treatment well    Behavior During Therapy [facility] for tasks assessed/performed             Past Medical History:  Diagnosis Date   Adenomatous colon polyp 09/2007   Arthritis    OA / PAIN LEFT KNEE   Cancer    prostate cancer   Cataract    removed both eyes   COVID-19 virus infection    Diverticulosis of colon    w/o hemorrage   Heart murmur    TOLD HE HAS A SLIGHT MURMUR   Hyperlipidemia    Inguinal hernia    left side    Thyroid disease    Past Surgical History:  Procedure Laterality Date   AORTIC VALVE REPLACEMENT N/A 08/18/2022   Procedure: AORTIC VALVE REPLACEMENT (AVR) USING EDWARDS INSPIRIS AORTIC VALVE SIZE ;  Surgeon: Lyn Hollingshead, MD;  Location: [facility] OR;  Service: Open Heart Surgery;  Laterality: N/A;   BACK SURGERY  05/12/2005   L5   CLIPPING OF ATRIAL APPENDAGE N/A 08/18/2022   Procedure: CLIPPING OF ATRIAL APPENDAGE USING ATRICURE 45 ATRICLIP;  Surgeon: Lyn Hollingshead, MD;  Location: [facility] OR;  Service: Open Heart Surgery;  Laterality: N/A;  Median Sternotomy   COLONOSCOPY  1999   Negative; Dr Russella Dar   COLONOSCOPY  2004   tics, hemorrhoids   COLONOSCOPY  2009   polyps (T.A.)   CORONARY ARTERY BYPASS GRAFT N/A 08/18/2022   Procedure: CORONARY ARTERY BYPASS GRAFTING (CABG) X2 USING LEFT INTERNAL MAMMARY ARTERY AND LEFT ENDOSCOPICALLY HARVESTED GREATER SAPHENOUS VEIN.;  Surgeon: Lyn Hollingshead, MD;  Location: [facility] OR;  Service: Open  Heart Surgery;  Laterality: N/A;   HERNIA REPAIR  1987   HERNIA REPAIR  10/2003   KNEE SURGERY  1969   Patellar fracture fragment Lt   LAMINECTOMY  2000   L4-5   LEFT HEART CATH AND CORONARY ANGIOGRAPHY N/A 08/16/2022   Procedure: LEFT HEART CATH AND CORONARY ANGIOGRAPHY;  Surgeon: Orbie Pyo, MD;  Location: [facility] INVASIVE CV LAB;  Service: Cardiovascular;  Laterality: N/A;   LUMBAR LAMINECTOMY/DECOMPRESSION MICRODISCECTOMY Right 10/08/2015   Procedure: MICRO LUMBAR DECOMPRESSION L4-L5 AND L5-S1 ON RIGHT ;  Surgeon: Jene Every, MD;  Location: [facility] ORS;  Service: Orthopedics;  Laterality: Right;   MASS EXCISION  04/26/2011   Procedure: MINOR EXCISION OF MASS;  Surgeon: Velora Heckler, MD;  Location: [facility];  Service: General;  Laterality: Right;  Excise subcutaneous mass right axilla Minor Room   MAZE N/A 08/18/2022   Procedure: MAZE USING ATRICURE Oralia Manis;  Surgeon: Lyn Hollingshead, MD;  Location: [facility] OR;  Service: Open Heart Surgery;  Laterality: N/A;   POLYPECTOMY     PROSTATECTOMY  04/16/2005   TEE WITHOUT CARDIOVERSION N/A 08/18/2022   Procedure: TRANSESOPHAGEAL ECHOCARDIOGRAM;  Surgeon: Lyn Hollingshead, MD;  Location: [facility] OR;  Service: Open Heart Surgery;  Laterality: N/A;   TONSILLECTOMY     TOTAL KNEE ARTHROPLASTY Left 07/03/2013   Procedure: LEFT TOTAL KNEE ARTHROPLASTY;  Surgeon: Shelda Pal, MD;  Location: [facility] ORS;  Service: Orthopedics;  Laterality: Left;   Patient Active Problem List   Diagnosis Date Noted   Pleural effusion 08/25/2022   Localized edema 08/25/2022   Atrial fibrillation with RVR 08/25/2022   S/P aortic valve replacement 08/18/2022   Hypotension 08/18/2022   S/P CABG x 2 08/18/2022   On mechanically assisted ventilation 08/18/2022   NSTEMI (non-ST elevated myocardial infarction) 08/15/2022   Atrial fibrillation 08/15/2022   Left arm pain 07/29/2022   Cervical radiculopathy 07/29/2022   GERD (gastroesophageal reflux disease)  03/18/2022   Murmur 02/11/2021   Premature beats 02/11/2021   Frequent PVCs 02/11/2021   LUQ pain 01/16/2019   Right cervical radiculopathy 03/03/2017   Spinal stenosis, lumbar 10/08/2015   Sensorineural hearing loss of both ears 04/10/2014   Organic impotence 04/10/2014   Hyperglycemia 04/02/2014   S/P left TKA 07/03/2013   Left knee DJD 06/07/2013   Hypothyroidism 03/03/2012   Axillary mass, right 04/07/2011   ROSACEA 03/19/2010   ARTHRALGIA 03/19/2010   CERVICALGIA 09/05/2009   CARPAL TUNNEL SYNDROME 06/17/2008   DEGENERATIVE JOINT DISEASE, GENERALIZED 06/17/2008   CERVICAL RADICULOPATHY, LEFT 06/17/2008   Disturbance in sleep behavior 03/04/2008   PROSTATE CANCER, HX OF 03/04/2008   Hyperlipidemia 06/15/2007   DIVERTICULOSIS, COLON W/O HEM 03/03/2007    REFERRING DIAG: M25.511 (ICD-10-CM) - Acute pain of right shoulder, M25.611 (ICD-10-CM) - Decreased ROM of right shoulder   THERAPY DIAG:  Acute pain of right shoulder  Abnormal posture  Rationale for Evaluation and Treatment Rehabilitation   OBJECTIVE: (objective measures completed at initial evaluation unless otherwise dated)  ONSET DATE: Last week   SUBJECTIVE:                                                                                                                                                                                       SUBJECTIVE STATEMENT: Pt reports his R shoulder is doing well. Pt denies any R shoulder with R UE use.   Hand dominance: Right   PAIN:  Are you having pain? Yes: NPRS scale: 0/10 Pain location: R shoulder Pain description: ache Aggravating factors: certain R UEmovements Relieving factors: predisone  PERTINENT HISTORY: CABG 08/18/22   PRECAUTIONS: Sternal   WEIGHT BEARING RESTRICTIONS: Yes don't use arms to assist with standing   FALLS:  Has patient fallen in last 6 months? No   LIVING ENVIRONMENT: Lives with: lives with their family Lives in: House/apartment  No issue with accessing or mobility within home   OCCUPATION: Retired   PLOF: Independent   PATIENT GOALS:Good use of my R arm   NEXT MD VISIT:    OBJECTIVE:    DIAGNOSTIC FINDINGS:  R shoulder DG 09/06/22 IMPRESSION: Slightly high-riding humeral head and mild greater tuberosity surface irregularity suggesting rotator cuff pathology.     Electronically Signed   By: Lesia Hausen M.D.   On: 09/06/2022 11:32   PATIENT SURVEYS:  FOTO: Perceived function   63%, predicted   74%    COGNITION: Overall cognitive status: Within functional limits for tasks assessed                                  SENSATION: WFL   POSTURE: Forward head, rounded shoulder    UPPER EXTREMITY ROM:  WNLs and equal to L Active ROM Right eval Left eval  Shoulder flexion      Shoulder extension      Shoulder abduction      Shoulder adduction      Shoulder internal rotation      Shoulder external rotation      Elbow flexion      Elbow extension      Wrist flexion      Wrist extension      Wrist ulnar deviation      Wrist radial deviation      Wrist pronation      Wrist supination      (Blank rows = not tested)   UPPER EXTREMITY MMT: Equal R to L no overt weakness MMT Right eval Left eval  Shoulder flexion      Shoulder extension      Shoulder abduction      Shoulder adduction      Shoulder internal rotation      Shoulder external rotation      Middle trapezius      Lower trapezius      Elbow flexion      Elbow extension      Wrist flexion      Wrist extension      Wrist ulnar deviation      Wrist radial deviation      Wrist pronation      Wrist supination      Grip strength (lbs)      (Blank rows = not tested)   SHOULDER SPECIAL TESTS: Impingement tests: Neer impingement test: negative Rotator cuff assessment: Empty can test: negative and Full can test: positive  for reproduction of pain Biceps assessment: Yergason's test: negative               TODAY'S  TREATMENT:     [facility] Adult PT Treatment:                                                DATE: 09/15/22 Therapeutic Exercise: Single arm shoulder rows limited ROM 3x10 RTB Single arm shoulder ER limited ROM 3x10 YTB  [facility] Adult PT Treatment:                                                DATE: 09/10/22 Therapeutic Exercise: Developed, instructed in, and pt completed therex as noted in HEP  Self Care: Proper support of R arm with rest      PATIENT EDUCATION: Education details: Eval findings, POC, HEP, self care  Person educated: Patient and Spouse Education method: Explanation, Demonstration, Tactile cues, Verbal cues, and Handouts Education comprehension: verbalized understanding, returned demonstration, verbal cues required, and tactile cues required   HOME EXERCISE PROGRAM: Access Code: [email or web address] URL: [email or web address] Date: 09/10/2022 Prepared by: Joellyn Rued   Exercises - Standing Shoulder Row with Anchored Resistance  - 1 x daily - 7 x weekly - 3 sets - 10 reps - 3 hold - Shoulder External Rotation with Anchored Resistance (Mirrored)  - 1 x daily - 7 x weekly - 3 sets - 10 reps - 3 hold   ASSESSMENT:   CLINICAL IMPRESSION: Reassessed Full Can R RC test and it was found negative. Pt denies R shoulder pain with daily activities. Shoulder row ex was changed from bilat to single arm to minimize sternal strain. Pt returned proper demonstration of therex for R RC and periscapular strengthening. Pt tolerated PT today without adverse effects. Pt is scheduled to return to PT in 2 weeks, however if his R shoulder continues to do well he may call to cancel further PT sessions. Pt's R shoulder is making appropriate progress.    OBJECTIVE IMPAIRMENTS: impaired UE functional use, postural dysfunction, and pain.    ACTIVITY LIMITATIONS: carrying,  lifting, and reach over head   PARTICIPATION LIMITATIONS: meal prep, cleaning, and laundry   PERSONAL FACTORS: Time since onset of injury/illness/exacerbation and 1 comorbidity: CABG 08/18/22  are also affecting patient's functional outcome.    REHAB POTENTIAL: Excellent   CLINICAL DECISION MAKING: Stable/uncomplicated   EVALUATION COMPLEXITY: Low     GOALS:   SHORT TERM GOALS=LTGS   LONG TERM GOALS: Target date: 10/29/22   Pt will be Ind in a final HEP to maintain achieved LOF  Baseline: initiated Goal status: MET-09/15/22   2.  Pt will report pain free movements of the R arm with daily activities Baseline: Pian c certain R arm movements Goal status: Improved-09/15/22   3.  Pt's FOTO score will improved to the predicted value of 74% as indication of improved function  Baseline: 63% Goal status: INITIAL   PLAN:   PT FREQUENCY: 1x/week   PT DURATION: 6 weeks   PLANNED INTERVENTIONS: Therapeutic exercises, Therapeutic activity, Patient/Family education, Self Care, Joint mobilization, Dry Needling, Spinal mobilization, Cryotherapy, Moist heat, Taping, Ionotophoresis 4mg /ml Dexamethasone, Manual therapy, and Re-evaluation   PLAN FOR NEXT SESSION: Review FOTO; assess response to HEP; progress therex as indicated; use of modalities, manual therapy; and TPDN as indicated.  Alexus Michael MS, PT 09/15/22 10:55 AM   PHYSICAL THERAPY DISCHARGE SUMMARY  Visits from Start of Care: 2  Current functional level related to goals / functional outcomes: Pt self Dced, See clinical impression and PT goals   Remaining deficits: Pt self Dced, See clinical impression and PT goals   Education / Equipment: HEP. Pt Ed   Patient agrees to discharge. Patient goals were partially met. Patient is being discharged due to being pleased with the current functional level.  Syaire Saber MS, PT 12/29/22 2:15 PM

## 2022-09-14 NOTE — Patient Instructions (Signed)
Visit Information  Thank you for taking time to visit with me today. Please don't hesitate to contact me if I can be of assistance to you.   Following are the goals we discussed today:  Continue to eat Healthy. Avoid saturated/transfats and processed foods Continue to attend appointments as recommended/scheduled Continue to take medications as prescribed Continue to attend therapy sessions as recommended   Our next appointment is by telephone on 10/12/22 at 9:45 am  Please call the care guide team at [phone] if you need to cancel or reschedule your appointment.   If you are experiencing a Mental Health or Behavioral Health Crisis or need someone to talk to, please call the Suicide and Crisis Lifeline: 44  Kathyrn Sheriff, RN, MSN, BSN, CCM Care Management Coordinator [phone]

## 2022-09-15 ENCOUNTER — Ambulatory Visit: Payer: PPO

## 2022-09-15 DIAGNOSIS — M25511 Pain in right shoulder: Secondary | ICD-10-CM

## 2022-09-15 DIAGNOSIS — R293 Abnormal posture: Secondary | ICD-10-CM

## 2022-09-16 DIAGNOSIS — Z0289 Encounter for other administrative examinations: Secondary | ICD-10-CM

## 2022-09-16 NOTE — Addendum Note (Signed)
Addended by: Alyson Ingles on: 09/16/2022 10:59 AM   Modules accepted: Orders

## 2022-09-17 ENCOUNTER — Telehealth (HOSPITAL_COMMUNITY): Payer: Self-pay

## 2022-09-17 NOTE — Telephone Encounter (Signed)
Pt insurance is active and benefits verified through [employer]. Co-pay $15.00, DED $0.00/$0.00 met, out of pocket $$3,200.00/$1,860.00 met, co-insurance 0%. No pre-authorization required. Erica/[employer], 09/17/22 @ 4:15PM, ZOX#[id number]   How many CR sessions are covered? (36 sessions/visits for TCR, 72 sessions/visits for ICR)72 visits Is this a lifetime maximum or an annual maximum? Annual Has the member used any of these services to date? No Is there a time limit (weeks/months) on start of program and/or program completion? No

## 2022-09-17 NOTE — Telephone Encounter (Signed)
Called and spoke with pt in regards to CR, pt stated he is not doing PT at this time. Per pt his last PT appt was 4/24. Adv pt he could not do CR and PT at the same time.   Patient will come in for orientation on 09/21/22 @ 930AM and will attend the 815AM exercise class.   Pensions consultant.

## 2022-09-20 ENCOUNTER — Telehealth (HOSPITAL_COMMUNITY): Payer: Self-pay

## 2022-09-20 ENCOUNTER — Encounter: Payer: Self-pay | Admitting: Internal Medicine

## 2022-09-20 NOTE — Telephone Encounter (Signed)
Reviewed with patient the Cardiac Rehab Cardiac Risk [facility] Nursing Assessment. Patient knows where our office is located.  

## 2022-09-21 ENCOUNTER — Encounter (HOSPITAL_COMMUNITY)
Admission: RE | Admit: 2022-09-21 | Discharge: 2022-09-21 | Disposition: A | Discharge status: Home / Self Care | Payer: PPO | Source: Ambulatory Visit | Attending: Internal Medicine | Admitting: Internal Medicine

## 2022-09-21 ENCOUNTER — Encounter (HOSPITAL_COMMUNITY): Payer: Self-pay

## 2022-09-21 VITALS — BP 94/66 | HR 61 | Ht 67.38 in | Wt 140.9 lb

## 2022-09-21 DIAGNOSIS — Z952 Presence of prosthetic heart valve: Secondary | ICD-10-CM | POA: Diagnosis not present

## 2022-09-21 DIAGNOSIS — Z951 Presence of aortocoronary bypass graft: Secondary | ICD-10-CM

## 2022-09-21 DIAGNOSIS — I214 Non-ST elevation (NSTEMI) myocardial infarction: Secondary | ICD-10-CM | POA: Insufficient documentation

## 2022-09-21 HISTORY — DX: Atherosclerotic heart disease of native coronary artery without angina pectoris: I25.10

## 2022-09-21 NOTE — Progress Notes (Signed)
Cardiac Rehab Medication Review by a Nurse  Does the patient  feel that his/her medications are working for him/her?  yes  Has the patient been experiencing any side effects to the medications prescribed?  yes  Does the patient measure his/her own blood pressure or blood glucose at home?  no   Does the patient have any problems obtaining medications due to transportation or finances?   no  Understanding of regimen: fair Understanding of indications: fair Potential of compliance: good    Nurse comments: Nadine Counts reports that he has a dry cough and hoarseness. Nadine Counts is wondering if it is related to the plavix he is taking. Nadine Counts does not check his blood pressure at home    Thayer Headings RN 09/21/2022 9:41 AM

## 2022-09-21 NOTE — Progress Notes (Addendum)
Patient here for cardiac intake for intensive cardiac rehab. Manual blood pressure hard to hear. BP with small cuff / automatic BP are as follows. Sitting BP 94/66. Standing BP 71/40 heart rate 64. Patient asymptomatic. Nadine Counts reports that he drank approximately 24 ounces of water prior to arrival. Patient given 8 ounces of water.  Repeat blood pressure 96/67 sitting. Standing blood pressure 79/54. Patient given an additional 8 ounces of water. Patient said he took his last midodrine on Sunday. Onsite provider Robin Searing NP notified via instant message about today's vitals. Recheck BP 106/61 sitting. Standing BP 95/65 after giving the patient another 8 ounces of water.  Yes he is fine to proceed  ED should recheck orthostatics after orientation is complete.  Post 6 minute Blood pressure 124/85 sitting. Standing blood pressure 111/75.  2 minute post BP 114/79. Standing BP 113/80.   Robin Searing onsite provider update on post walk test BP's will forward today's vital signs  to Dr Wyline Mood for review.Will continue to monitor the patient throughout  the program. Thayer Headings RN BSN

## 2022-09-21 NOTE — Progress Notes (Signed)
Cardiac Individual Treatment Plan  Patient Details  Name: Jeremy Waters MRN: [id number] Date of Birth: 1946/03/25 Referring Provider:   Flowsheet Row INTENSIVE CARDIAC REHAB ORIENT from 09/21/2022 in [facility]  Referring Provider Enter, Waverly Ferrari, MD       Initial Encounter Date:  Flowsheet Row INTENSIVE CARDIAC REHAB ORIENT from 09/21/2022 in [facility]  Date 09/21/22       Visit Diagnosis: 08/15/22 NSTEMI (non-ST elevated myocardial infarction) (HCC)  08/18/22 S/P AVR (aortic valve replacement)  08/18/22 S/P CABG x 2  Patient's Home Medications on Admission:  Current Outpatient Medications:    aspirin EC 81 MG tablet, Take 1 tablet (81 mg total) by mouth daily., Disp: , Rfl:    beta carotene 40981 UNIT capsule, Take 25,000 Units by mouth daily., Disp: , Rfl:    clonazePAM (KLONOPIN) 0.5 MG tablet, TAKE ONE TABLET BY MOUTH AT BEDTIME AS NEEDED (Patient taking differently: Take 0.5 mg by mouth at bedtime as needed (Sleep).), Disp: 30 tablet, Rfl: 0   clopidogrel (PLAVIX) 75 MG tablet, Take 1 tablet (75 mg total) by mouth daily., Disp: 30 tablet, Rfl: 11   fluticasone (FLONASE) 50 MCG/ACT nasal spray, SPRAY ONE SPRAY IN EACH NOSTRIL ONCE DAILY (Patient taking differently: Place 1 spray into both nostrils daily.), Disp: 16 mL, Rfl: 3   glycopyrrolate (ROBINUL) 2 MG tablet, Take 1 tablet (2 mg total) by mouth every morning., Disp: 90 tablet, Rfl: 0   levothyroxine (SYNTHROID) 50 MCG tablet, Take 1 tablet by mouth daily, Disp: 90 tablet, Rfl: 2   pyridOXINE (VITAMIN B-6) 100 MG tablet, Take 100 mg by mouth daily., Disp: , Rfl:    rosuvastatin (CRESTOR) 40 MG tablet, TAKE 1 TABLET BY MOUTH DAILY, Disp: 90 tablet, Rfl: 1   vitamin C (ASCORBIC ACID) 500 MG tablet, Take 500 mg by mouth 2 (two) times daily., Disp: , Rfl:    vitamin E 400 UNIT capsule, Take 400 Units by  mouth daily., Disp: , Rfl:    furosemide (LASIX) 20 MG tablet, Take 1 tablet (20 mg total) by mouth daily. X 3 days, then use daily as needed for weight gain of 3-5 lbs in 24 hr time period (Patient not taking: Reported on 09/21/2022), Disp: 30 tablet, Rfl: 1   midodrine (PROAMATINE) 10 MG tablet, Take 1 tablet (10 mg total) by mouth 3 (three) times daily with meals. X 7 days, then decrease to 1 tab BID x 7 days, then decrease to 10 mg daily x 7 days then stop (Patient not taking: Reported on 09/21/2022), Disp: 90 tablet, Rfl: 1   potassium chloride (KLOR-CON M) 10 MEQ tablet, Take 1 tablet (10 mEq total) by mouth daily. X 3 days, then daily as needed on days you take Lasix (Patient not taking: Reported on 09/21/2022), Disp: 30 tablet, Rfl: 1   traMADol (ULTRAM) 50 MG tablet, Take 1 tablet (50 mg total) by mouth every 6 (six) hours as needed for moderate pain. (Patient not taking: Reported on 09/14/2022), Disp: 30 tablet, Rfl: 0  Past Medical History: Past Medical History:  Diagnosis Date   Adenomatous colon polyp 09/2007   Arthritis    OA / PAIN LEFT KNEE   Cancer (HCC)    prostate cancer   Cataract    removed both eyes   Coronary artery disease    COVID-19 virus infection    Diverticulosis of colon  w/o hemorrage   Heart murmur    TOLD HE HAS A SLIGHT MURMUR   Hyperlipidemia    Inguinal hernia    left side    Thyroid disease     Tobacco Use: Social History   Tobacco Use  Smoking Status Former  Smokeless Tobacco Never  Tobacco Comments   stopped smoking cigarettes 1983, 1 cigar /day 2001-2013    Labs: Review Flowsheet  More data exists      Latest Ref Rng & Units 05/04/2022 08/16/2022 08/17/2022 08/18/2022 08/19/2022  Labs for ITP Cardiac and Pulmonary Rehab  Cholestrol 0 - 200 mg/dL 960  - 454  - -  LDL (calc) 0 - 99 mg/dL 75  - 85  - -  HDL-C >09 mg/dL 81.19  - 61  - -  Trlycerides <150 mg/dL 14.7  - 63  - -  Hemoglobin A1c 4.8 - 5.6 % 5.7  5.4  - - -  PH, Arterial 7.35  - 7.45 - - - 7.308  7.336  7.386  7.365  7.375  7.388  7.337  7.425  7.405   PCO2 arterial 32 - 48 mmHg - - - 41.7  37.1  37.5  41.4  40.7  40.0  34.9  34.6  34.7   Bicarbonate 20.0 - 28.0 mmol/L - - - 21.0  20.3  22.5  23.7  23.8  24.1  18.7  22.6  21.6   TCO2 22 - 32 mmol/L - - - 22  22  24  26  26  25  25  25  25  25  28  20  26  24  23    Acid-base deficit 0.0 - 2.0 mmol/L - - - 5.0  6.0  2.0  2.0  1.0  1.0  6.0  1.0  3.0   O2 Saturation % - - - 99  99  100  100  89  100  100  98  99     Capillary Blood Glucose: Lab Results  Component Value Date   GLUCAP 88 08/23/2022   GLUCAP 83 08/23/2022   GLUCAP 87 08/22/2022   GLUCAP 241 (H) 08/22/2022   GLUCAP 124 (H) 08/22/2022     Exercise Target Goals: Exercise Program Goal: Individual exercise prescription set using results from initial 6 min walk test and THRR while considering  patient's activity barriers and safety.   Exercise Prescription Goal: Initial exercise prescription builds to 30-45 minutes a day of aerobic activity, 2-3 days per week.  Home exercise guidelines will be given to patient during program as part of exercise prescription that the participant will acknowledge.  Activity Barriers & Risk Stratification:  Activity Barriers & Cardiac Risk Stratification - 09/21/22 1030       Activity Barriers & Cardiac Risk Stratification   Activity Barriers Left Knee Replacement;Back Problems   Hx 3 back surgeries. Sees PT every 3 weeks for back.   Cardiac Risk Stratification High             6 Minute Walk:  6 Minute Walk     Row Name 09/21/22 1041         6 Minute Walk   Phase Initial     Distance 1327 feet     Walk Time 6 minutes     # of Rest Breaks 0     MPH 2.51     METS 2.76     RPE 11     Perceived Dyspnea  0  VO2 Peak 9.66     Symptoms No     Resting HR 61 bpm     Resting BP 94/66     Resting Oxygen Saturation  98 %     Exercise Oxygen Saturation  during 6 min walk 96 %     Max Ex. HR 78 bpm      Max Ex. BP 124/85     2 Minute Post BP 114/79              Oxygen Initial Assessment:   Oxygen Re-Evaluation:   Oxygen Discharge (Final Oxygen Re-Evaluation):   Initial Exercise Prescription:  Initial Exercise Prescription - 09/21/22 1100       Date of Initial Exercise RX and Referring Provider   Date 09/21/22    Referring Provider Enter, Waverly Ferrari, MD    Expected Discharge Date 12/03/22      Recumbant Elliptical   Level 1    Minutes 15    METs 2.8      Track   Laps 14    Minutes 15    METs 2.79      Prescription Details   Frequency (times per week) 3    Duration Progress to 30 minutes of continuous aerobic without signs/symptoms of physical distress      Intensity   THRR 40-80% of Max Heartrate 58-116    Ratings of Perceived Exertion 11-13    Perceived Dyspnea 0-4      Progression   Progression Continue to progress workloads to maintain intensity without signs/symptoms of physical distress.      Resistance Training   Training Prescription Yes    Weight 3 lbs    Reps 10-15             Perform Capillary Blood Glucose checks as needed.  Exercise Prescription Changes:   Exercise Comments:   Exercise Goals and Review:   Exercise Goals     Row Name 09/21/22 0946             Exercise Goals   Increase Physical Activity Yes       Intervention Provide advice, education, support and counseling about physical activity/exercise needs.;Develop an individualized exercise prescription for aerobic and resistive training based on initial evaluation findings, risk stratification, comorbidities and participant's personal goals.       Expected Outcomes Short Term: Attend rehab on a regular basis to increase amount of physical activity.;Long Term: Add in home exercise to make exercise part of routine and to increase amount of physical activity.;Long Term: Exercising regularly at least 3-5 days a week.       Increase Strength and Stamina Yes        Intervention Provide advice, education, support and counseling about physical activity/exercise needs.;Develop an individualized exercise prescription for aerobic and resistive training based on initial evaluation findings, risk stratification, comorbidities and participant's personal goals.       Expected Outcomes Short Term: Increase workloads from initial exercise prescription for resistance, speed, and METs.;Short Term: Perform resistance training exercises routinely during rehab and add in resistance training at home;Long Term: Improve cardiorespiratory fitness, muscular endurance and strength as measured by increased METs and functional capacity ( )       Able to understand and use rate of perceived exertion (RPE) scale Yes       Intervention Provide education and explanation on how to use RPE scale       Expected Outcomes Short Term: Able to use RPE daily in rehab to express  subjective intensity level;Long Term:  Able to use RPE to guide intensity level when exercising independently       Knowledge and understanding of Target Heart Rate Range (THRR) Yes       Intervention Provide education and explanation of THRR including how the numbers were predicted and where they are located for reference       Expected Outcomes Short Term: Able to state/look up THRR;Long Term: Able to use THRR to govern intensity when exercising independently;Short Term: Able to use daily as guideline for intensity in rehab       Able to check pulse independently Yes       Intervention Provide education and demonstration on how to check pulse in carotid and radial arteries.;Review the importance of being able to check your own pulse for safety during independent exercise       Expected Outcomes Short Term: Able to explain why pulse checking is important during independent exercise;Long Term: Able to check pulse independently and accurately       Understanding of Exercise Prescription Yes       Intervention Provide  education, explanation, and written materials on patient's individual exercise prescription       Expected Outcomes Short Term: Able to explain program exercise prescription;Long Term: Able to explain home exercise prescription to exercise independently                Exercise Goals Re-Evaluation :   Discharge Exercise Prescription (Final Exercise Prescription Changes):   Nutrition:  Target Goals: Understanding of nutrition guidelines, daily intake of sodium 1500mg , cholesterol 200mg , calories 30% from fat and 7% or less from saturated fats, daily to have 5 or more servings of fruits and vegetables.  Biometrics:  Pre Biometrics - 09/21/22 0906       Pre Biometrics   Waist Circumference 32 inches    Hip Circumference 37 inches    Waist to Hip Ratio 0.86 %    Triceps Skinfold 9 mm    % Body Fat 19.9 %    Grip Strength 21 kg    Flexibility 0 in   knees bent   Single Leg Stand 30 seconds              Nutrition Therapy Plan and Nutrition Goals:   Nutrition Assessments:  MEDIFICTS Score Key: ?70 Need to make dietary changes  40-70 Heart Healthy Diet ? 40 Therapeutic Level Cholesterol Diet    Picture Your Plate Scores: <16 Unhealthy dietary pattern with much room for improvement. 41-50 Dietary pattern unlikely to meet recommendations for good health and room for improvement. 51-60 More healthful dietary pattern, with some room for improvement.  >60 Healthy dietary pattern, although there may be some specific behaviors that could be improved.    Nutrition Goals Re-Evaluation:   Nutrition Goals Re-Evaluation:   Nutrition Goals Discharge (Final Nutrition Goals Re-Evaluation):   Psychosocial: Target Goals: Acknowledge presence or absence of significant depression and/or stress, maximize coping skills, provide positive support system. Participant is able to verbalize types and ability to use techniques and skills needed for reducing stress and  depression.  Initial Review & Psychosocial Screening:  Initial Psych Review & Screening - 09/21/22 1051       Initial Review   Current issues with None Identified      Family Dynamics   Good Support System? Yes   Nadine Counts has his wife, daughter and grandchildren for support.     Barriers   Psychosocial barriers to participate in program  There are no identifiable barriers or psychosocial needs.      Screening Interventions   Interventions Encouraged to exercise    Expected Outcomes Short Term goal: Identification and review with participant of any Quality of Life or Depression concerns found by scoring the questionnaire.             Quality of Life Scores:  Quality of Life - 09/21/22 1138       Quality of Life   Select Quality of Life      Quality of Life Scores   Health/Function Pre 13 %    Socioeconomic Pre 23 %    Psych/Spiritual Pre 21.86 %    Family Pre 30 %    GLOBAL Pre 19.49 %            Scores of 19 and below usually indicate a poorer quality of life in these areas.  A difference of  2-3 points is a clinically meaningful difference.  A difference of 2-3 points in the total score of the Quality of Life Index has been associated with significant improvement in overall quality of life, self-image, physical symptoms, and general health in studies assessing change in quality of life.  PHQ-9: Review Flowsheet  More data exists      09/21/2022 09/06/2022 07/29/2022 03/18/2022 02/23/2021  Depression screen PHQ 2/9  Decreased Interest 0 0 0 0 0  Down, Depressed, Hopeless 0 0 0 0 0  PHQ - 2 Score 0 0 0 0 0  Altered sleeping 3 - 2 - -  Tired, decreased energy 1 - 1 - -  Change in appetite 0 - 0 - -  Feeling bad or failure about yourself  0 - 0 - -  Trouble concentrating 0 - 1 - -  Moving slowly or fidgety/restless 0 - 0 - -  Suicidal thoughts 0 - 0 - -  PHQ-9 Score 4 - 4 - -  Difficult doing work/chores Not difficult at all - Not difficult at all - -    Interpretation of Total Score  Total Score Depression Severity:  1-4 = Minimal depression, 5-9 = Mild depression, 10-14 = Moderate depression, 15-19 = Moderately severe depression, 20-27 = Severe depression   Psychosocial Evaluation and Intervention:   Psychosocial Re-Evaluation:   Psychosocial Discharge (Final Psychosocial Re-Evaluation):   Vocational Rehabilitation: Provide vocational rehab assistance to qualifying candidates.   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 Participant will state understanding/return demonstration of topics presented.     Core Videos: Exercise    Move It!  Clinical staff conducted group or individual video education with verbal and written material and guidebook.  Patient learns the recommended Pritikin exercise program. Exercise with the goal of living a long, healthy life. Some of the health benefits of exercise include controlled diabetes, healthier blood pressure levels, improved cholesterol levels, improved heart and lung capacity, improved sleep, and better body composition. Everyone should speak with their doctor before starting or changing an exercise routine.  Biomechanical Limitations Clinical staff conducted group or individual video education with verbal and written material and guidebook.  Patient learns  how biomechanical limitations can impact exercise and how we can mitigate and possibly overcome limitations to have an impactful and balanced exercise routine.  Body Composition Clinical staff conducted group or individual video education with verbal and written material and  guidebook.  Patient learns that body composition (ratio of muscle mass to fat mass) is a key component to assessing overall fitness, rather than body weight alone. Increased fat mass, especially visceral belly fat, can put [place] at increased risk for metabolic syndrome, type 2 diabetes, heart disease, and even death. It is recommended to combine diet and exercise (cardiovascular and resistance training) to improve your body composition. Seek guidance from your physician and exercise physiologist before implementing an exercise routine.  Exercise Action Plan Clinical staff conducted group or individual video education with verbal and written material and guidebook.  Patient learns the recommended strategies to achieve and enjoy long-term exercise adherence, including variety, self-motivation, self-efficacy, and positive decision making. Benefits of exercise include fitness, good health, weight management, more energy, better sleep, less stress, and overall well-being.  Medical   Heart Disease Risk Reduction Clinical staff conducted group or individual video education with verbal and written material and guidebook.  Patient learns our heart is our most vital organ as it circulates oxygen, nutrients, white blood cells, and hormones throughout the entire body, and carries waste away. Data supports a plant-based eating plan like the Pritikin Program for its effectiveness in slowing progression of and reversing heart disease. The video provides a number of recommendations to address heart disease.   Metabolic Syndrome and Belly Fat  Clinical staff conducted group or individual video education with verbal and written material and guidebook.  Patient learns what metabolic syndrome is, how it leads to heart disease, and how one can reverse it and keep it from coming back. You have metabolic syndrome if you have 3 of the following 5 criteria: abdominal obesity, high blood pressure, high triglycerides, low HDL  cholesterol, and high blood sugar.  Hypertension and Heart Disease Clinical staff conducted group or individual video education with verbal and written material and guidebook.  Patient learns that high blood pressure, or hypertension, is very common in the [place]. Hypertension is largely due to excessive salt intake, but other important risk factors include being overweight, physical inactivity, drinking too much alcohol, smoking, and not eating enough potassium from fruits and vegetables. High blood pressure is a leading risk factor for heart attack, stroke, congestive heart failure, dementia, kidney failure, and premature death. Long-term effects of excessive salt intake include stiffening of the arteries and thickening of heart muscle and organ damage. Recommendations include ways to reduce hypertension and the risk of heart disease.  Diseases of Our Time - Focusing on Diabetes Clinical staff conducted group or individual video education with verbal and written material and guidebook.  Patient learns why the best way to stop diseases of our time is prevention, through food and other lifestyle changes. Medicine (such as prescription pills and surgeries) is often only a Band-Aid on the problem, not a long-term solution. Most common diseases of our time include obesity, type 2 diabetes, hypertension, heart disease, and cancer. The Pritikin Program is recommended and has been proven to help reduce, reverse, and/or prevent the damaging effects of metabolic syndrome.  Nutrition   Overview of the Pritikin Eating Plan  Clinical staff conducted group or individual video education with verbal and written material and guidebook.  Patient learns about the Pritikin Eating Plan for disease risk reduction. The Pritikin  Eating Plan emphasizes a wide variety of unrefined, minimally-processed carbohydrates, like fruits, vegetables, whole grains, and legumes. Go, Caution, and Stop food choices are explained.  Plant-based and lean animal proteins are emphasized. Rationale provided for low sodium intake for blood pressure control, low added sugars for blood sugar stabilization, and low added fats and oils for coronary artery disease risk reduction and weight management.  Calorie Density  Clinical staff conducted group or individual video education with verbal and written material and guidebook.  Patient learns about calorie density and how it impacts the Pritikin Eating Plan. Knowing the characteristics of the food you choose will help you decide whether those foods will lead to weight gain or weight loss, and whether you want to consume more or less of them. Weight loss is usually a side effect of the Pritikin Eating Plan because of its focus on low calorie-dense foods.  Label Reading  Clinical staff conducted group or individual video education with verbal and written material and guidebook.  Patient learns about the Pritikin recommended label reading guidelines and corresponding recommendations regarding calorie density, added sugars, sodium content, and whole grains.  Dining Out - Part 1  Clinical staff conducted group or individual video education with verbal and written material and guidebook.  Patient learns that restaurant meals can be sabotaging because they can be so high in calories, fat, sodium, and/or sugar. Patient learns recommended strategies on how to positively address this and avoid unhealthy pitfalls.  Facts on Fats  Clinical staff conducted group or individual video education with verbal and written material and guidebook.  Patient learns that lifestyle modifications can be just as effective, if not more so, as many medications for lowering your risk of heart disease. A Pritikin lifestyle can help to reduce your risk of inflammation and atherosclerosis (cholesterol build-up, or plaque, in the artery walls). Lifestyle interventions such as dietary choices and physical activity address  the cause of atherosclerosis. A review of the types of fats and their impact on blood cholesterol levels, along with dietary recommendations to reduce fat intake is also included.  Nutrition Action Plan  Clinical staff conducted group or individual video education with verbal and written material and guidebook.  Patient learns how to incorporate Pritikin recommendations into their lifestyle. Recommendations include planning and keeping personal health goals in mind as an important part of their success.  Healthy Mind-Set    Healthy Minds, Bodies, Hearts  Clinical staff conducted group or individual video education with verbal and written material and guidebook.  Patient learns how to identify when they are stressed. Video will discuss the impact of that stress, as well as the many benefits of stress management. Patient will also be introduced to stress management techniques. The way we think, act, and feel has an impact on our hearts.  How Our Thoughts Can Heal Our Hearts  Clinical staff conducted group or individual video education with verbal and written material and guidebook.  Patient learns that negative thoughts can cause depression and anxiety. This can result in negative lifestyle behavior and serious health problems. Cognitive behavioral therapy is an effective method to help control our thoughts in order to change and improve our emotional outlook.  Additional Videos:  Exercise    Improving Performance  Clinical staff conducted group or individual video education with verbal and written material and guidebook.  Patient learns to use a non-linear approach by alternating intensity levels and lengths of time spent exercising to help burn more calories and lose more body  fat. Cardiovascular exercise helps improve heart health, metabolism, hormonal balance, blood sugar control, and recovery from fatigue. Resistance training improves strength, endurance, balance, coordination, reaction time,  metabolism, and muscle mass. Flexibility exercise improves circulation, posture, and balance. Seek guidance from your physician and exercise physiologist before implementing an exercise routine and learn your capabilities and proper form for all exercise.  Introduction to Yoga  Clinical staff conducted group or individual video education with verbal and written material and guidebook.  Patient learns about yoga, a discipline of the coming together of mind, breath, and body. The benefits of yoga include improved flexibility, improved range of motion, better posture and core strength, increased lung function, weight loss, and positive self-image. Yoga's heart health benefits include lowered blood pressure, healthier heart rate, decreased cholesterol and triglyceride levels, improved immune function, and reduced stress. Seek guidance from your physician and exercise physiologist before implementing an exercise routine and learn your capabilities and proper form for all exercise.  Medical   Aging: Enhancing Your Quality of Life  Clinical staff conducted group or individual video education with verbal and written material and guidebook.  Patient learns key strategies and recommendations to stay in good physical health and enhance quality of life, such as prevention strategies, having an advocate, securing a Health Care Proxy and Power of Attorney, and keeping a list of medications and system for tracking them. It also discusses how to avoid risk for bone loss.  Biology of Weight Control  Clinical staff conducted group or individual video education with verbal and written material and guidebook.  Patient learns that weight gain occurs because we consume more calories than we burn (eating more, moving less). Even if your body weight is normal, you may have higher ratios of fat compared to muscle mass. Too much body fat puts you at increased risk for cardiovascular disease, heart attack, stroke, type 2  diabetes, and obesity-related cancers. In addition to exercise, following the Pritikin Eating Plan can help reduce your risk.  Decoding Lab Results  Clinical staff conducted group or individual video education with verbal and written material and guidebook.  Patient learns that lab test reflects one measurement whose values change over time and are influenced by many factors, including medication, stress, sleep, exercise, food, hydration, pre-existing medical conditions, and more. It is recommended to use the knowledge from this video to become more involved with your lab results and evaluate your numbers to speak with your doctor.   Diseases of Our Time - Overview  Clinical staff conducted group or individual video education with verbal and written material and guidebook.  Patient learns that according to the CDC, 50% to 70% of chronic diseases (such as obesity, type 2 diabetes, elevated lipids, hypertension, and heart disease) are avoidable through lifestyle improvements including healthier food choices, listening to satiety cues, and increased physical activity.  Sleep Disorders Clinical staff conducted group or individual video education with verbal and written material and guidebook.  Patient learns how good quality and duration of sleep are important to overall health and well-being. Patient also learns about sleep disorders and how they impact health along with recommendations to address them, including discussing with a physician.  Nutrition  Dining Out - Part 2 Clinical staff conducted group or individual video education with verbal and written material and guidebook.  Patient learns how to plan ahead and communicate in order to maximize their dining experience in a healthy and nutritious manner. Included are recommended food choices based on the type of restaurant  the patient is visiting.   Fueling a Banker conducted group or individual video education with verbal  and written material and guidebook.  There is a strong connection between our food choices and our health. Diseases like obesity and type 2 diabetes are very prevalent and are in large-part due to lifestyle choices. The Pritikin Eating Plan provides plenty of food and hunger-curbing satisfaction. It is easy to follow, affordable, and helps reduce health risks.  Menu Workshop  Clinical staff conducted group or individual video education with verbal and written material and guidebook.  Patient learns that restaurant meals can sabotage health goals because they are often packed with calories, fat, sodium, and sugar. Recommendations include strategies to plan ahead and to communicate with the manager, chef, or server to help order a healthier meal.  Planning Your Eating Strategy  Clinical staff conducted group or individual video education with verbal and written material and guidebook.  Patient learns about the Pritikin Eating Plan and its benefit of reducing the risk of disease. The Pritikin Eating Plan does not focus on calories. Instead, it emphasizes high-quality, nutrient-rich foods. By knowing the characteristics of the foods, we choose, we can determine their calorie density and make informed decisions.  Targeting Your Nutrition Priorities  Clinical staff conducted group or individual video education with verbal and written material and guidebook.  Patient learns that lifestyle habits have a tremendous impact on disease risk and progression. This video provides eating and physical activity recommendations based on your personal health goals, such as reducing LDL cholesterol, losing weight, preventing or controlling type 2 diabetes, and reducing high blood pressure.  Vitamins and Minerals  Clinical staff conducted group or individual video education with verbal and written material and guidebook.  Patient learns different ways to obtain key vitamins and minerals, including through a recommended  healthy diet. It is important to discuss all supplements you take with your doctor.   Healthy Mind-Set    Smoking Cessation  Clinical staff conducted group or individual video education with verbal and written material and guidebook.  Patient learns that cigarette smoking and tobacco addiction pose a serious health risk which affects millions of people. Stopping smoking will significantly reduce the risk of heart disease, lung disease, and many forms of cancer. Recommended strategies for quitting are covered, including working with your doctor to develop a successful plan.  Culinary   Becoming a Set designer conducted group or individual video education with verbal and written material and guidebook.  Patient learns that cooking at home can be healthy, cost-effective, quick, and puts them in control. Keys to cooking healthy recipes will include looking at your recipe, assessing your equipment needs, planning ahead, making it simple, choosing cost-effective seasonal ingredients, and limiting the use of added fats, salts, and sugars.  Cooking - Breakfast and Snacks  Clinical staff conducted group or individual video education with verbal and written material and guidebook.  Patient learns how important breakfast is to satiety and nutrition through the entire day. Recommendations include key foods to eat during breakfast to help stabilize blood sugar levels and to prevent overeating at meals later in the day. Planning ahead is also a key component.  Cooking - Educational psychologist conducted group or individual video education with verbal and written material and guidebook.  Patient learns eating strategies to improve overall health, including an approach to cook more at home. Recommendations include thinking of animal protein as a side on  your plate rather than center stage and focusing instead on lower calorie dense options like vegetables, fruits, whole grains, and  plant-based proteins, such as beans. Making sauces in large quantities to freeze for later and leaving the skin on your vegetables are also recommended to maximize your experience.  Cooking - Healthy Salads and Dressing Clinical staff conducted group or individual video education with verbal and written material and guidebook.  Patient learns that vegetables, fruits, whole grains, and legumes are the foundations of the Pritikin Eating Plan. Recommendations include how to incorporate each of these in flavorful and healthy salads, and how to create homemade salad dressings. Proper handling of ingredients is also covered. Cooking - Soups and [employer] - Soups and Desserts Clinical staff conducted group or individual video education with verbal and written material and guidebook.  Patient learns that Pritikin soups and desserts make for easy, nutritious, and delicious snacks and meal components that are low in sodium, fat, sugar, and calorie density, while high in vitamins, minerals, and filling fiber. Recommendations include simple and healthy ideas for soups and desserts.   Overview     The Pritikin Solution Program Overview Clinical staff conducted group or individual video education with verbal and written material and guidebook.  Patient learns that the results of the Pritikin Program have been documented in more than 100 articles published in peer-reviewed journals, and the benefits include reducing risk factors for (and, in some cases, even reversing) high cholesterol, high blood pressure, type 2 diabetes, obesity, and more! An overview of the three key pillars of the Pritikin Program will be covered: eating well, doing regular exercise, and having a healthy mind-set.  WORKSHOPS  Exercise: Exercise Basics: Building Your Action Plan Clinical staff led group instruction and group discussion with PowerPoint presentation and patient guidebook. To enhance the learning environment the use of  posters, models and videos may be added. At the conclusion of this workshop, patients will comprehend the difference between physical activity and exercise, as well as the benefits of incorporating both, into their routine. Patients will understand the FITT (Frequency, Intensity, Time, and Type) principle and how to use it to build an exercise action plan. In addition, safety concerns and other considerations for exercise and cardiac rehab will be addressed by the presenter. The purpose of this lesson is to promote a comprehensive and effective weekly exercise routine in order to improve patients' overall level of fitness.   Managing Heart Disease: Your Path to a Healthier Heart Clinical staff led group instruction and group discussion with PowerPoint presentation and patient guidebook. To enhance the learning environment the use of posters, models and videos may be added.At the conclusion of this workshop, patients will understand the anatomy and physiology of the heart. Additionally, they will understand how Pritikin's three pillars impact the risk factors, the progression, and the management of heart disease.  The purpose of this lesson is to provide a high-level overview of the heart, heart disease, and how the Pritikin lifestyle positively impacts risk factors.  Exercise Biomechanics Clinical staff led group instruction and group discussion with PowerPoint presentation and patient guidebook. To enhance the learning environment the use of posters, models and videos may be added. Patients will learn how the structural parts of their bodies function and how these functions impact their daily activities, movement, and exercise. Patients will learn how to promote a neutral spine, learn how to manage pain, and identify ways to improve their physical movement in order to promote healthy living.  The purpose of this lesson is to expose patients to common physical limitations that impact physical  activity. Participants will learn practical ways to adapt and manage aches and pains, and to minimize their effect on regular exercise. Patients will learn how to maintain good posture while sitting, walking, and lifting.  Balance Training and Fall Prevention  Clinical staff led group instruction and group discussion with PowerPoint presentation and patient guidebook. To enhance the learning environment the use of posters, models and videos may be added. At the conclusion of this workshop, patients will understand the importance of their sensorimotor skills (vision, proprioception, and the vestibular system) in maintaining their ability to balance as they age. Patients will apply a variety of balancing exercises that are appropriate for their current level of function. Patients will understand the common causes for poor balance, possible solutions to these problems, and ways to modify their physical environment in order to minimize their fall risk. The purpose of this lesson is to teach patients about the importance of maintaining balance as they age and ways to minimize their risk of falling.  WORKSHOPS   Nutrition:  Fueling a Ship broker led group instruction and group discussion with PowerPoint presentation and patient guidebook. To enhance the learning environment the use of posters, models and videos may be added. Patients will review the foundational principles of the Pritikin Eating Plan and understand what constitutes a serving size in each of the food groups. Patients will also learn Pritikin-friendly foods that are better choices when away from home and review make-ahead meal and snack options. Calorie density will be reviewed and applied to three nutrition priorities: weight maintenance, weight loss, and weight gain. The purpose of this lesson is to reinforce (in a group setting) the key concepts around what patients are recommended to eat and how to apply these guidelines  when away from home by planning and selecting Pritikin-friendly options. Patients will understand how calorie density may be adjusted for different weight management goals.  Mindful Eating  Clinical staff led group instruction and group discussion with PowerPoint presentation and patient guidebook. To enhance the learning environment the use of posters, models and videos may be added. Patients will briefly review the concepts of the Pritikin Eating Plan and the importance of low-calorie dense foods. The concept of mindful eating will be introduced as well as the importance of paying attention to internal hunger signals. Triggers for non-hunger eating and techniques for dealing with triggers will be explored. The purpose of this lesson is to provide patients with the opportunity to review the basic principles of the Pritikin Eating Plan, discuss the value of eating mindfully and how to measure internal cues of hunger and fullness using the Hunger Scale. Patients will also discuss reasons for non-hunger eating and learn strategies to use for controlling emotional eating.  Targeting Your Nutrition Priorities Clinical staff led group instruction and group discussion with PowerPoint presentation and patient guidebook. To enhance the learning environment the use of posters, models and videos may be added. Patients will learn how to determine their genetic susceptibility to disease by reviewing their family history. Patients will gain insight into the importance of diet as part of an overall healthy lifestyle in mitigating the impact of genetics and other environmental insults. The purpose of this lesson is to provide patients with the opportunity to assess their personal nutrition priorities by looking at their family history, their own health history and current risk factors. Patients will also be able to  discuss ways of prioritizing and modifying the Pritikin Eating Plan for their highest risk areas  Menu   Clinical staff led group instruction and group discussion with PowerPoint presentation and patient guidebook. To enhance the learning environment the use of posters, models and videos may be added. Using menus brought in from [employer], or printed from [employer], patients will apply the Pritikin dining out guidelines that were presented in the [employer] video. Patients will also be able to practice these guidelines in a variety of provided scenarios. The purpose of this lesson is to provide patients with the opportunity to practice hands-on learning of the Pritikin Dining Out guidelines with actual menus and practice scenarios.  Label Reading Clinical staff led group instruction and group discussion with PowerPoint presentation and patient guidebook. To enhance the learning environment the use of posters, models and videos may be added. Patients will review and discuss the Pritikin label reading guidelines presented in Pritikin's Label Reading Educational series video. Using fool labels brought in from local grocery stores and markets, patients will apply the label reading guidelines and determine if the packaged food meet the Pritikin guidelines. The purpose of this lesson is to provide patients with the opportunity to review, discuss, and practice hands-on learning of the Pritikin Label Reading guidelines with actual packaged food labels. Cooking School  Pritikin's [employer] are designed to teach patients ways to prepare quick, simple, and affordable recipes at home. The importance of nutrition's role in chronic disease risk reduction is reflected in its emphasis in the overall Pritikin program. By learning how to prepare essential core Pritikin Eating Plan recipes, patients will increase control over what they eat; be able to customize the flavor of foods without the use of added salt, sugar, or fat; and improve the quality of the food they consume. By  learning a set of core recipes which are easily assembled, quickly prepared, and affordable, patients are more likely to prepare more healthy foods at home. These workshops focus on convenient breakfasts, simple entres, side dishes, and desserts which can be prepared with minimal effort and are consistent with nutrition recommendations for cardiovascular risk reduction. Cooking [employer] are taught by a Armed forces logistics/support/administrative officer (RD) who has been trained by the [employer]. The chef or RD has a clear understanding of the importance of minimizing - if not completely eliminating - added fat, sugar, and sodium in recipes. Throughout the series of Cooking School Workshop sessions, patients will learn about healthy ingredients and efficient methods of cooking to build confidence in their capability to prepare    Cooking School weekly topics:  Adding Flavor- Sodium-Free  Fast and Healthy Breakfasts  Powerhouse Plant-Based Proteins  Satisfying Salads and Dressings  Simple Sides and Sauces  International Cuisine-Spotlight on the [employer] Zones  Delicious Desserts  Savory Soups  [employer] - Meals in a Astronomer Appetizers and Snacks  Comforting Weekend Breakfasts  One-Pot Wonders   Fast Evening Meals  Landscape architect Your Pritikin Plate  WORKSHOPS   Healthy Mindset (Psychosocial):  Focused Goals, Sustainable Changes Clinical staff led group instruction and group discussion with PowerPoint presentation and patient guidebook. To enhance the learning environment the use of posters, models and videos may be added. Patients will be able to apply effective goal setting strategies to establish at least one personal goal, and then take consistent, meaningful action toward that goal. They will learn to identify common barriers to achieving personal  goals and develop strategies to overcome them. Patients will also gain an understanding of how our mind-set can  impact our ability to achieve goals and the importance of cultivating a positive and growth-oriented mind-set. The purpose of this lesson is to provide patients with a deeper understanding of how to set and achieve personal goals, as well as the tools and strategies needed to overcome common obstacles which may arise along the way.  From Head to Heart: The Power of a Healthy Outlook  Clinical staff led group instruction and group discussion with PowerPoint presentation and patient guidebook. To enhance the learning environment the use of posters, models and videos may be added. Patients will be able to recognize and describe the impact of emotions and mood on physical health. They will discover the importance of self-care and explore self-care practices which may work for them. Patients will also learn how to utilize the 4 C's to cultivate a healthier outlook and better manage stress and challenges. The purpose of this lesson is to demonstrate to patients how a healthy outlook is an essential part of maintaining good health, especially as they continue their cardiac rehab journey.  Healthy Sleep for a Healthy Heart Clinical staff led group instruction and group discussion with PowerPoint presentation and patient guidebook. To enhance the learning environment the use of posters, models and videos may be added. At the conclusion of this workshop, patients will be able to demonstrate knowledge of the importance of sleep to overall health, well-being, and quality of life. They will understand the symptoms of, and treatments for, common sleep disorders. Patients will also be able to identify daytime and nighttime behaviors which impact sleep, and they will be able to apply these tools to help manage sleep-related challenges. The purpose of this lesson is to provide patients with a general overview of sleep and outline the importance of quality sleep. Patients will learn about a few of the most common sleep  disorders. Patients will also be introduced to the concept of "sleep hygiene," and discover ways to self-manage certain sleeping problems through simple daily behavior changes. Finally, the workshop will motivate patients by clarifying the links between quality sleep and their goals of heart-healthy living.   Recognizing and Reducing Stress Clinical staff led group instruction and group discussion with PowerPoint presentation and patient guidebook. To enhance the learning environment the use of posters, models and videos may be added. At the conclusion of this workshop, patients will be able to understand the types of stress reactions, differentiate between acute and chronic stress, and recognize the impact that chronic stress has on their health. They will also be able to apply different coping mechanisms, such as reframing negative self-talk. Patients will have the opportunity to practice a variety of stress management techniques, such as deep abdominal breathing, progressive muscle relaxation, and/or guided imagery.  The purpose of this lesson is to educate patients on the role of stress in their lives and to provide healthy techniques for coping with it.  Learning Barriers/Preferences:  Learning Barriers/Preferences - 09/21/22 1202       Learning Barriers/Preferences   Learning Barriers Hearing   wears bilateral hearing aides   Learning Preferences Skilled Demonstration             Education Topics:  Knowledge Questionnaire Score:  Knowledge Questionnaire Score - 09/21/22 1139       Knowledge Questionnaire Score   Pre Score 19/24             Core  Components/Risk Factors/Patient Goals at Admission:  Personal Goals and Risk Factors at Admission - 09/21/22 0947       Core Components/Risk Factors/Patient Goals on Admission   Hypertension --    Intervention --    Expected Outcomes --    Lipids Yes    Intervention Provide education and support for participant on nutrition &  aerobic/resistive exercise along with prescribed medications to achieve LDL 70mg , HDL >40mg .    Expected Outcomes Short Term: Participant states understanding of desired cholesterol values and is compliant with medications prescribed. Participant is following exercise prescription and nutrition guidelines.;Long Term: Cholesterol controlled with medications as prescribed, with individualized exercise RX and with personalized nutrition plan. Value goals: LDL < 70mg , HDL > 40 mg.    Personal Goal Other Yes    Personal Goal Be able to walk 6 miles/day on the golf [place]. Resume exercising 40 minutes on his elliptical machine at home. Resume core muscle exercises.    Intervention Develop individualized exercise action plan including aerobic, resistance, and stretching exercise to help build strength and stamina.    Expected Outcomes Patient will be able to resume home exercise routine on his elliptical machine. Patient will be able to walk the golf course. Patient will be able to resume core strength exercises once cleared by physician to do so.             Core Components/Risk Factors/Patient Goals Review:    Core Components/Risk Factors/Patient Goals at Discharge (Final Review):    ITP Comments:  ITP Comments     Row Name 09/21/22 0906           ITP Comments Medical Director- Dr. Armanda Magic, MD. Intrduction to                Comments: Participant attended orientation for the cardiac rehabilitation program on  09/21/2022  to perform initial intake and exercise walk test. Patient introduced to the Pritikin Program education and orientation packet was reviewed. Patient's blood pressure was low at entry 94/66 seated, 71/40 standing,asymptomatic. Patient hydrated and onsite provided notified. Recheck BP prior to walk test was: 106/61 seated, 95/65 standing. Completed 6-minute walk test, measurements, initial ITP, and exercise prescription. Telemetry-normal sinus rhythm with T-wave  inversion, BBB, and rare PVCs, asymptomatic.   Service time was from 906 to 1109.

## 2022-09-21 NOTE — Telephone Encounter (Signed)
-----   Message from Maisie Fus, MD sent at 09/21/2022  2:13 PM EDT ----- He can continue for now. ----- Message ----- From: Cammy Copa, RN Sent: 09/21/2022  11:30 AM EDT To: Shon Hale, CMA; Maisie Fus, MD  Good morning Branch, Mr Justo attended cardiac rehab evaluation for exercise this morning has asymptomatic orthostatic changes today. Completed 6 minute walk test without difficulty.  He stopped his midodrine on Sunday per order. BP came up after water. Does he need to continue midodrine?  Thanks for your assistance, [facility]  Cardiac Rehab

## 2022-09-22 ENCOUNTER — Other Ambulatory Visit: Payer: Self-pay | Admitting: Internal Medicine

## 2022-09-24 ENCOUNTER — Ambulatory Visit: Payer: PPO

## 2022-09-24 ENCOUNTER — Encounter: Payer: Self-pay | Admitting: Internal Medicine

## 2022-09-26 ENCOUNTER — Encounter (HOSPITAL_BASED_OUTPATIENT_CLINIC_OR_DEPARTMENT_OTHER): Payer: Self-pay

## 2022-09-26 ENCOUNTER — Emergency Department (HOSPITAL_BASED_OUTPATIENT_CLINIC_OR_DEPARTMENT_OTHER): Payer: PPO

## 2022-09-26 ENCOUNTER — Emergency Department (HOSPITAL_BASED_OUTPATIENT_CLINIC_OR_DEPARTMENT_OTHER)
Admission: EM | Admit: 2022-09-26 | Discharge: 2022-09-26 | Disposition: A | Discharge status: Home / Self Care | Payer: PPO | Attending: Emergency Medicine | Admitting: Emergency Medicine

## 2022-09-26 DIAGNOSIS — Z951 Presence of aortocoronary bypass graft: Secondary | ICD-10-CM | POA: Insufficient documentation

## 2022-09-26 DIAGNOSIS — M436 Torticollis: Secondary | ICD-10-CM | POA: Diagnosis not present

## 2022-09-26 DIAGNOSIS — Z7982 Long term (current) use of aspirin: Secondary | ICD-10-CM | POA: Insufficient documentation

## 2022-09-26 DIAGNOSIS — Z8546 Personal history of malignant neoplasm of prostate: Secondary | ICD-10-CM | POA: Insufficient documentation

## 2022-09-26 DIAGNOSIS — M542 Cervicalgia: Secondary | ICD-10-CM | POA: Diagnosis not present

## 2022-09-26 MED ORDER — HYDROCODONE-ACETAMINOPHEN 5-325 MG PO TABS: 1.0000 | ORAL_TABLET | Freq: Four times a day (QID) | ORAL | 0 refills | Status: DC | PRN

## 2022-09-26 MED ORDER — HYDROCODONE-ACETAMINOPHEN 5-325 MG PO TABS
1.0000 | ORAL_TABLET | Freq: Once | ORAL | Status: AC
  Administered 2022-09-26: 1 via ORAL
  Filled 2022-09-26: qty 1

## 2022-09-26 MED ORDER — DIAZEPAM 5 MG PO TABS
5.0000 mg | ORAL_TABLET | Freq: Once | ORAL | Status: AC
  Administered 2022-09-26: 5 mg via ORAL
  Filled 2022-09-26: qty 1

## 2022-09-26 NOTE — ED Triage Notes (Signed)
He c/o non-traumatic post neck pain which began at left pot. Neck and nowinvolves both right and left post. Neck. He denies paresthesias, and bilat. Handgrip strength is equal and strong.

## 2022-09-26 NOTE — ED Provider Notes (Signed)
[place] EMERGENCY DEPARTMENT AT [facility] Provider Note   CSN: [id number] Arrival date & time: 09/26/22  1009     History  Chief Complaint  Patient presents with   Torticollis    Jeremy Waters is a 77 y.o. male, hx of recent valve replacement, CABG,  prostate cancer, who presents to the ED secondary to bilateral neck pain has been going on for the last 3 days.  He states it came on suddenly about 3 days ago, and he has difficulty moving his head left and right.  He states he has not had any accidents, and has no chest pain, radiation of the pain, shortness of breath, visual changes, or severe headache.  He states that the pain is worse when he turns his head left or right, and does not have any numbness or tingling down both of his arms.  No weakness either.  Notes that he has had reduced activity since having his CABG, and his valve replacement, and thus he sits in the same place for long periods of time.    Home Medications Prior to Admission medications   Medication Sig Start Date End Date Taking? Authorizing Provider  HYDROcodone-acetaminophen (NORCO) 5-325 MG tablet Take 1 tablet by mouth every 6 (six) hours as needed for moderate pain. 09/26/22  Yes Elnita Surprenant L, PA  aspirin EC 81 MG tablet Take 1 tablet (81 mg total) by mouth daily. 08/26/22   Barrett, Erin R, PA-C  beta carotene 95284 UNIT capsule Take 25,000 Units by mouth daily.    [provider]  clonazePAM (KLONOPIN) 0.5 MG tablet TAKE ONE TABLET BY MOUTH AT BEDTIME AS NEEDED 09/23/22   Pincus Sanes, MD  clopidogrel (PLAVIX) 75 MG tablet Take 1 tablet (75 mg total) by mouth daily. 08/26/22 08/26/23  Barrett, Erin R, PA-C  fluticasone (FLONASE) 50 MCG/ACT nasal spray SPRAY ONE SPRAY IN EACH NOSTRIL ONCE DAILY Patient taking differently: Place 1 spray into both nostrils daily. 08/09/22   Pincus Sanes, MD  furosemide (LASIX) 20 MG tablet Take 1 tablet (20 mg total) by mouth daily. X 3 days, then use daily as  needed for weight gain of 3-5 lbs in 24 hr time period Patient not taking: Reported on 09/21/2022 08/26/22   Barrett, Denny Peon R, PA-C  glycopyrrolate (ROBINUL) 2 MG tablet Take 1 tablet (2 mg total) by mouth every morning. 08/11/22   Esterwood, Amy S, PA-C  levothyroxine (SYNTHROID) 50 MCG tablet Take 1 tablet by mouth daily 02/03/22   Pincus Sanes, MD  midodrine (PROAMATINE) 10 MG tablet Take 1 tablet (10 mg total) by mouth 3 (three) times daily with meals. X 7 days, then decrease to 1 tab BID x 7 days, then decrease to 10 mg daily x 7 days then stop Patient not taking: Reported on 09/21/2022 08/26/22   Barrett, Erin R, PA-C  potassium chloride (KLOR-CON M) 10 MEQ tablet Take 1 tablet (10 mEq total) by mouth daily. X 3 days, then daily as needed on days you take Lasix Patient not taking: Reported on 09/21/2022 08/26/22   Barrett, Erin R, PA-C  pyridOXINE (VITAMIN B-6) 100 MG tablet Take 100 mg by mouth daily.    [provider]  rosuvastatin (CRESTOR) 40 MG tablet TAKE 1 TABLET BY MOUTH DAILY 09/10/22   Pincus Sanes, MD  traMADol (ULTRAM) 50 MG tablet Take 1 tablet (50 mg total) by mouth every 6 (six) hours as needed for moderate pain. Patient not taking: Reported on  09/14/2022 08/26/22   Barrett, Erin R, PA-C  vitamin C (ASCORBIC ACID) 500 MG tablet Take 500 mg by mouth 2 (two) times daily.    [provider]  vitamin E 400 UNIT capsule Take 400 Units by mouth daily.    [provider]      Allergies    Atorvastatin    Review of Systems   Review of Systems  Constitutional:  Negative for fever.  Musculoskeletal:  Positive for neck pain and neck stiffness.    Physical Exam Updated Vital Signs BP 134/71   Pulse 66   Temp 97.9 F (36.6 C) (Oral)   Resp 16   SpO2 96%  Physical Exam Vitals and nursing note reviewed.  Constitutional:      General: He is not in acute distress.    Appearance: He is well-developed.  HENT:     Head: Normocephalic and atraumatic.  Eyes:      Conjunctiva/sclera: Conjunctivae normal.  Neck:     Comments: Tenderness to palpation of bilateral SCM's, with difficulty with rotation of neck left and right.  Flexion extension intact.  No midline tenderness.  No ecchymosis, pulsatile masses Cardiovascular:     Rate and Rhythm: Normal rate and regular rhythm.     Heart sounds: No murmur heard. Pulmonary:     Effort: Pulmonary effort is normal. No respiratory distress.     Breath sounds: Normal breath sounds.  Abdominal:     Palpations: Abdomen is soft.     Tenderness: There is no abdominal tenderness.  Musculoskeletal:        General: No swelling.  Skin:    General: Skin is warm and dry.     Capillary Refill: Capillary refill takes less than 2 seconds.  Neurological:     Mental Status: He is alert.  Psychiatric:        Mood and Affect: Mood normal.     ED Results / Procedures / Treatments   Labs (all labs ordered are listed, but only abnormal results are displayed) Labs Reviewed - No data to display  EKG None  Radiology CT Cervical Spine Wo Contrast  Result Date: 09/26/2022 CLINICAL DATA:  77 year old male with left posterior neck pain, decreased range of motion. EXAM: CT CERVICAL SPINE WITHOUT CONTRAST TECHNIQUE: Multidetector CT imaging of the cervical spine was performed without intravenous contrast. Multiplanar CT image reconstructions were also generated. RADIATION DOSE REDUCTION: This exam was performed according to the departmental dose-optimization program which includes automated exposure control, adjustment of the mA and/or kV according to patient size and/or use of iterative reconstruction technique. COMPARISON:  Cervical spine MRI 03/21/2017. FINDINGS: Alignment: Mild straightening of cervical lordosis has not significantly changed. Cervicothoracic junction alignment is within normal limits. Bilateral posterior element alignment is within normal limits. Skull base and vertebrae: Bone mineralization is within normal  limits for age. Visualized skull base is intact. No atlanto-occipital dissociation. C1 and C2 appear intact and aligned. No acute osseous abnormality identified. Soft tissues and spinal canal: No prevertebral fluid or swelling. No visible canal hematoma. Negative visible noncontrast neck soft tissues aside from calcified carotid atherosclerosis. Disc levels:  C2-C3 facet ankylosis on the left. Superimposed chronic C1-odontoid degeneration. And disc and endplate degeneration C3-C4 through C6-C7. C5-C6 and C6-C7 dominant and somewhat bulky disc and endplate degeneration. Spinal stenosis at those levels, probably stable from the 2018 MRI. No other cervical ankylosis. Upper chest: Visible upper thoracic levels appear intact. Lung apices are clear. Other: Negative visible noncontrast brain parenchyma. Calcified  atherosclerosis at the skull base. IMPRESSION: 1. No acute osseous abnormality in the cervical spine. 2. Chronic left C2-C3 facet ankylosis, with at least moderately advanced cervical spine degeneration elsewhere. Mild chronic spinal stenosis at C5-C6 and C6-C7, probably stable from a 2018 MRI. Electronically Signed   By: Odessa Fleming M.D.   On: 09/26/2022 12:32    Procedures Procedures    Medications Ordered in ED Medications  diazepam (VALIUM) tablet 5 mg (5 mg Oral Given 09/26/22 1049)  HYDROcodone-acetaminophen (NORCO/VICODIN) 5-325 MG per tablet 1 tablet (1 tablet Oral Given 09/26/22 1157)    ED Course/ Medical Decision Making/ A&P                             Medical Decision Making Patient is a 77 year old male, here for neck pain been going on for the last 3 days, states he recently had a CABG, heart valve replacement, and has been sitting more.  Denies any kind of injury.  On exam he has tenderness to palpation of his bilateral TMs, pain with rotation to the left and the right.  No midline tenderness.  We will trial some Valium to see if some of the spasticity improves, and then further evaluate,  upon reeval, patient still very spastic, complaining of pain, we will obtain a CT for any kind of evaluation of severe spinal stenosis, injury.  Will give Norco for pain control.  Amount and/or Complexity of Data Reviewed Radiology: ordered.    Details: CT shows no acute findings, just shows degenerative disc disease, spinal stenosis no fractures visualized. Discussion of management or test interpretation with external provider(s): Hu discussed with patient feeling little bit better after Norco, some increased mobility of neck, able to go side-to-side.  Given improvement with pain medications, Valium, and negative CT, at this is reassuring that is likely musculoskeletal in nature.  Believe it is this is likely secondary to torticollis/muscle spasm/contracture.  Discussed with Dr. Jarold Motto, he evaluated patient, in agreement with plan.  Will give him a short dose of Norco, for severe pain, use Tylenol but no more than 4 g total including the Norco 325 of Tylenol.  And also use lidocaine patches.  He is to heat, warm baths to help with the spasticity of the right region.  We discussed return precautions and he voiced understanding, follow-up with primary care doctor.  Risk Prescription drug management.   Final Clinical Impression(s) / ED Diagnoses Final diagnoses:  Torticollis    Rx / DC Orders ED Discharge Orders          Ordered    HYDROcodone-acetaminophen (NORCO) 5-325 MG tablet  Every 6 hours PRN        09/26/22 1303              Akacia Boltz, Harley Alto, PA 09/26/22 1311    Rondel Baton, MD 09/28/22 1227

## 2022-09-26 NOTE — Discharge Instructions (Addendum)
I believe your pain is secondary to muscle spasm/rigidity, versus a cervical sprain.  I sent you some medication for your pain, you should use MiraLAX to help prevent any kind of constipation with this, and it may make you drowsy increased but also take it sparingly.  You should use lidocaine patches, for your neck, to help with this as well as take warm baths or use a heating pad.  Return to the ER if you have numbness, weakness down your bilateral arms, or severe worsening neck pain.

## 2022-09-26 NOTE — ED Notes (Signed)
At this time he is returned from CT. His wife remains with him.

## 2022-09-27 ENCOUNTER — Encounter (HOSPITAL_COMMUNITY): Payer: Self-pay | Admitting: Cardiothoracic Surgery

## 2022-09-27 ENCOUNTER — Encounter (HOSPITAL_COMMUNITY): Payer: PPO

## 2022-09-27 DIAGNOSIS — M542 Cervicalgia: Secondary | ICD-10-CM | POA: Diagnosis not present

## 2022-09-27 DIAGNOSIS — S161XXA Strain of muscle, fascia and tendon at neck level, initial encounter: Secondary | ICD-10-CM | POA: Diagnosis not present

## 2022-09-27 DIAGNOSIS — M47812 Spondylosis without myelopathy or radiculopathy, cervical region: Secondary | ICD-10-CM | POA: Diagnosis not present

## 2022-09-28 ENCOUNTER — Telehealth (HOSPITAL_COMMUNITY): Payer: Self-pay | Admitting: *Deleted

## 2022-09-29 ENCOUNTER — Ambulatory Visit: Payer: PPO

## 2022-09-29 ENCOUNTER — Encounter (HOSPITAL_COMMUNITY): Payer: PPO

## 2022-09-29 ENCOUNTER — Encounter (HOSPITAL_COMMUNITY)
Admission: RE | Admit: 2022-09-29 | Discharge: 2022-09-29 | Disposition: A | Discharge status: Home / Self Care | Payer: PPO | Source: Ambulatory Visit | Attending: Internal Medicine | Admitting: Internal Medicine

## 2022-09-29 DIAGNOSIS — Z952 Presence of prosthetic heart valve: Secondary | ICD-10-CM | POA: Diagnosis not present

## 2022-09-29 DIAGNOSIS — I214 Non-ST elevation (NSTEMI) myocardial infarction: Secondary | ICD-10-CM | POA: Insufficient documentation

## 2022-09-29 DIAGNOSIS — Z951 Presence of aortocoronary bypass graft: Secondary | ICD-10-CM | POA: Insufficient documentation

## 2022-09-29 NOTE — Progress Notes (Signed)
Daily Session Note  Patient Details  Name: Jeremy Waters MRN: [id number] Date of Birth: 1946-04-05 Referring Provider:   Flowsheet Row INTENSIVE CARDIAC REHAB ORIENT from 09/21/2022 in [facility]  Referring Provider Enter, Waverly Ferrari, MD       Encounter Date: 09/29/2022  Check In:  Session Check In - 09/29/22 7846       Check-In   Supervising physician immediately available to respond to emergencies [facility] MD immediately available    Physician(s) Carlos Levering, NP    Location [facility]    Staff Present Raford Pitcher, MS, ACSM-CEP, Exercise Physiologist;Maygen Sirico Remus Loffler, RN, MHA;Bailey Wallace Cullens, MS, Exercise Physiologist;Jetta Dan Humphreys BS, ACSM-CEP, Exercise Physiologist;Johnny Hale Bogus, MS, Exercise Physiologist    Virtual Visit No    Medication changes reported     Yes    Comments Predisone taper    Fall or balance concerns reported    No    Tobacco Cessation No Change    Warm-up and Cool-down Performed as group-led instruction    Resistance Training Performed No    VAD Patient? No    PAD/SET Patient? No      Pain Assessment   Currently in Pain? No/denies    Pain Score 0-No pain    Multiple Pain Sites No             Capillary Blood Glucose: No results found for this or any previous visit (from the past 24 hour(s)).    Social History   Tobacco Use  Smoking Status Former  Smokeless Tobacco Never  Tobacco Comments   stopped smoking cigarettes 1983, 1 cigar /day 2001-2013    Goals Met:  Independence with exercise equipment Exercise tolerated well No report of concerns or symptoms today  Goals Unmet:  Not Applicable  Comments: Pt in cardiac rehab group exercise and education class today.  Pt tolerated light exercise without difficulty. VSS, telemetry-Sinus Rhythm, asymptomatic.  Reports started taking prednisone after seeing Othopedic physician on Monday. Instructed patient to provide  dose verification Friday for Medication list reconciliation. Pt denies barriers to medication compliance.  PSYCHOSOCIAL ASSESSMENT:  PHQ=4, patient reports that he has trouble with sleep, waking up during the night and has been that way for years. Pt exhibits positive coping skills, hopeful outlook with supportive family and says is happy. No psychosocial needs identified at this time, no psychosocial interventions necessary.  Pt oriented to exercise equipment and gym routine. Understanding verbalized.    Dr. Armanda Magic is Medical Director for Cardiac Rehab at [facility].

## 2022-09-29 NOTE — Progress Notes (Signed)
Cardiac Individual Treatment Plan  Patient Details  Name: Jeremy Waters MRN: [id number] Date of Birth: 22-Aug-1945 Referring Provider:   Flowsheet Row INTENSIVE CARDIAC REHAB ORIENT from 09/21/2022 in [facility]  Referring Provider Enter, Waverly Ferrari, MD       Initial Encounter Date:  Flowsheet Row INTENSIVE CARDIAC REHAB ORIENT from 09/21/2022 in [facility]  Date 09/21/22       Visit Diagnosis: 08/15/22 NSTEMI (non-ST elevated myocardial infarction) (HCC)  08/18/22 S/P CABG x 2  08/18/22 S/P AVR (aortic valve replacement)  Patient's Home Medications on Admission:  Current Outpatient Medications:    aspirin EC 81 MG tablet, Take 1 tablet (81 mg total) by mouth daily., Disp: , Rfl:    beta carotene 40981 UNIT capsule, Take 25,000 Units by mouth daily., Disp: , Rfl:    clonazePAM (KLONOPIN) 0.5 MG tablet, TAKE ONE TABLET BY MOUTH AT BEDTIME AS NEEDED, Disp: 30 tablet, Rfl: 0   clopidogrel (PLAVIX) 75 MG tablet, Take 1 tablet (75 mg total) by mouth daily., Disp: 30 tablet, Rfl: 11   fluticasone (FLONASE) 50 MCG/ACT nasal spray, SPRAY ONE SPRAY IN EACH NOSTRIL ONCE DAILY (Patient taking differently: Place 1 spray into both nostrils daily.), Disp: 16 mL, Rfl: 3   furosemide (LASIX) 20 MG tablet, Take 1 tablet (20 mg total) by mouth daily. X 3 days, then use daily as needed for weight gain of 3-5 lbs in 24 hr time period (Patient not taking: Reported on 09/21/2022), Disp: 30 tablet, Rfl: 1   glycopyrrolate (ROBINUL) 2 MG tablet, Take 1 tablet (2 mg total) by mouth every morning., Disp: 90 tablet, Rfl: 0   HYDROcodone-acetaminophen (NORCO) 5-325 MG tablet, Take 1 tablet by mouth every 6 (six) hours as needed for moderate pain., Disp: 10 tablet, Rfl: 0   levothyroxine (SYNTHROID) 50 MCG tablet, Take 1 tablet by mouth daily, Disp: 90 tablet, Rfl: 2   midodrine (PROAMATINE) 10 MG  tablet, Take 1 tablet (10 mg total) by mouth 3 (three) times daily with meals. X 7 days, then decrease to 1 tab BID x 7 days, then decrease to 10 mg daily x 7 days then stop (Patient not taking: Reported on 09/21/2022), Disp: 90 tablet, Rfl: 1   potassium chloride (KLOR-CON M) 10 MEQ tablet, Take 1 tablet (10 mEq total) by mouth daily. X 3 days, then daily as needed on days you take Lasix (Patient not taking: Reported on 09/21/2022), Disp: 30 tablet, Rfl: 1   pyridOXINE (VITAMIN B-6) 100 MG tablet, Take 100 mg by mouth daily., Disp: , Rfl:    rosuvastatin (CRESTOR) 40 MG tablet, TAKE 1 TABLET BY MOUTH DAILY, Disp: 90 tablet, Rfl: 1   traMADol (ULTRAM) 50 MG tablet, Take 1 tablet (50 mg total) by mouth every 6 (six) hours as needed for moderate pain. (Patient not taking: Reported on 09/14/2022), Disp: 30 tablet, Rfl: 0   vitamin C (ASCORBIC ACID) 500 MG tablet, Take 500 mg by mouth 2 (two) times daily., Disp: , Rfl:    vitamin E 400 UNIT capsule, Take 400 Units by mouth daily., Disp: , Rfl:   Past Medical History: Past Medical History:  Diagnosis Date   Adenomatous colon polyp 09/2007   Arthritis    OA / PAIN LEFT KNEE   Cancer (HCC)    prostate cancer   Cataract    removed both eyes   Coronary artery disease  COVID-19 virus infection    Diverticulosis of colon    w/o hemorrage   Heart murmur    TOLD HE HAS A SLIGHT MURMUR   Hyperlipidemia    Inguinal hernia    left side    Thyroid disease     Tobacco Use: Social History   Tobacco Use  Smoking Status Former  Smokeless Tobacco Never  Tobacco Comments   stopped smoking cigarettes 1983, 1 cigar /day 2001-2013    Labs: Review Flowsheet  More data exists      Latest Ref Rng & Units 05/04/2022 08/16/2022 08/17/2022 08/18/2022 08/19/2022  Labs for ITP Cardiac and Pulmonary Rehab  Cholestrol 0 - 200 mg/dL 161  - 096  - -  LDL (calc) 0 - 99 mg/dL 75  - 85  - -  HDL-C >04 mg/dL 54.09  - 61  - -  Trlycerides <150 mg/dL 81.1  - 63  - -   Hemoglobin A1c 4.8 - 5.6 % 5.7  5.4  - - -  PH, Arterial 7.35 - 7.45 - - - 7.308  7.336  7.386  7.365  7.375  7.388  7.337  7.425  7.405   PCO2 arterial 32 - 48 mmHg - - - 41.7  37.1  37.5  41.4  40.7  40.0  34.9  34.6  34.7   Bicarbonate 20.0 - 28.0 mmol/L - - - 21.0  20.3  22.5  23.7  23.8  24.1  18.7  22.6  21.6   TCO2 22 - 32 mmol/L - - - 22  22  24  26  26  25  25  25  25  25  28  20  26  24  23    Acid-base deficit 0.0 - 2.0 mmol/L - - - 5.0  6.0  2.0  2.0  1.0  1.0  6.0  1.0  3.0   O2 Saturation % - - - 99  99  100  100  89  100  100  98  99     Capillary Blood Glucose: Lab Results  Component Value Date   GLUCAP 88 08/23/2022   GLUCAP 83 08/23/2022   GLUCAP 87 08/22/2022   GLUCAP 241 (H) 08/22/2022   GLUCAP 124 (H) 08/22/2022     Exercise Target Goals: Exercise Program Goal: Individual exercise prescription set using results from initial 6 min walk test and THRR while considering  patient's activity barriers and safety.   Exercise Prescription Goal: Initial exercise prescription builds to 30-45 minutes a day of aerobic activity, 2-3 days per week.  Home exercise guidelines will be given to patient during program as part of exercise prescription that the participant will acknowledge.  Activity Barriers & Risk Stratification:  Activity Barriers & Cardiac Risk Stratification - 09/21/22 1030       Activity Barriers & Cardiac Risk Stratification   Activity Barriers Left Knee Replacement;Back Problems   Hx 3 back surgeries. Sees PT every 3 weeks for back.   Cardiac Risk Stratification High             6 Minute Walk:  6 Minute Walk     Row Name 09/21/22 1041         6 Minute Walk   Phase Initial     Distance 1327 feet     Walk Time 6 minutes     # of Rest Breaks 0     MPH 2.51     METS 2.76     RPE  11     Perceived Dyspnea  0     VO2 Peak 9.66     Symptoms No     Resting HR 61 bpm     Resting BP 94/66     Resting Oxygen Saturation  98 %     Exercise  Oxygen Saturation  during 6 min walk 96 %     Max Ex. HR 78 bpm     Max Ex. BP 124/85     2 Minute Post BP 114/79              Oxygen Initial Assessment:   Oxygen Re-Evaluation:   Oxygen Discharge (Final Oxygen Re-Evaluation):   Initial Exercise Prescription:  Initial Exercise Prescription - 09/21/22 1100       Date of Initial Exercise RX and Referring Provider   Date 09/21/22    Referring Provider Enter, Waverly Ferrari, MD    Expected Discharge Date 12/03/22      Recumbant Elliptical   Level 1    Minutes 15    METs 2.8      Track   Laps 14    Minutes 15    METs 2.79      Prescription Details   Frequency (times per week) 3    Duration Progress to 30 minutes of continuous aerobic without signs/symptoms of physical distress      Intensity   THRR 40-80% of Max Heartrate 58-116    Ratings of Perceived Exertion 11-13    Perceived Dyspnea 0-4      Progression   Progression Continue to progress workloads to maintain intensity without signs/symptoms of physical distress.      Resistance Training   Training Prescription Yes    Weight 3 lbs    Reps 10-15             Perform Capillary Blood Glucose checks as needed.  Exercise Prescription Changes:   Exercise Comments:   Exercise Goals and Review:   Exercise Goals     Row Name 09/21/22 0946             Exercise Goals   Increase Physical Activity Yes       Intervention Provide advice, education, support and counseling about physical activity/exercise needs.;Develop an individualized exercise prescription for aerobic and resistive training based on initial evaluation findings, risk stratification, comorbidities and participant's personal goals.       Expected Outcomes Short Term: Attend rehab on a regular basis to increase amount of physical activity.;Long Term: Add in home exercise to make exercise part of routine and to increase amount of physical activity.;Long Term: Exercising regularly at least 3-5  days a week.       Increase Strength and Stamina Yes       Intervention Provide advice, education, support and counseling about physical activity/exercise needs.;Develop an individualized exercise prescription for aerobic and resistive training based on initial evaluation findings, risk stratification, comorbidities and participant's personal goals.       Expected Outcomes Short Term: Increase workloads from initial exercise prescription for resistance, speed, and METs.;Short Term: Perform resistance training exercises routinely during rehab and add in resistance training at home;Long Term: Improve cardiorespiratory fitness, muscular endurance and strength as measured by increased METs and functional capacity ( )       Able to understand and use rate of perceived exertion (RPE) scale Yes       Intervention Provide education and explanation on how to use RPE scale  Expected Outcomes Short Term: Able to use RPE daily in rehab to express subjective intensity level;Long Term:  Able to use RPE to guide intensity level when exercising independently       Knowledge and understanding of Target Heart Rate Range (THRR) Yes       Intervention Provide education and explanation of THRR including how the numbers were predicted and where they are located for reference       Expected Outcomes Short Term: Able to state/look up THRR;Long Term: Able to use THRR to govern intensity when exercising independently;Short Term: Able to use daily as guideline for intensity in rehab       Able to check pulse independently Yes       Intervention Provide education and demonstration on how to check pulse in carotid and radial arteries.;Review the importance of being able to check your own pulse for safety during independent exercise       Expected Outcomes Short Term: Able to explain why pulse checking is important during independent exercise;Long Term: Able to check pulse independently and accurately       Understanding of  Exercise Prescription Yes       Intervention Provide education, explanation, and written materials on patient's individual exercise prescription       Expected Outcomes Short Term: Able to explain program exercise prescription;Long Term: Able to explain home exercise prescription to exercise independently                Exercise Goals Re-Evaluation :   Discharge Exercise Prescription (Final Exercise Prescription Changes):   Nutrition:  Target Goals: Understanding of nutrition guidelines, daily intake of sodium 1500mg , cholesterol 200mg , calories 30% from fat and 7% or less from saturated fats, daily to have 5 or more servings of fruits and vegetables.  Biometrics:  Pre Biometrics - 09/21/22 0906       Pre Biometrics   Waist Circumference 32 inches    Hip Circumference 37 inches    Waist to Hip Ratio 0.86 %    Triceps Skinfold 9 mm    % Body Fat 19.9 %    Grip Strength 21 kg    Flexibility 0 in   knees bent   Single Leg Stand 30 seconds              Nutrition Therapy Plan and Nutrition Goals:  Nutrition Therapy & Goals - 09/29/22 1043       Nutrition Therapy   Diet Heart Healthy Diet    Drug/Food Interactions Statins/Certain Fruits      Personal Nutrition Goals   Nutrition Goal Patient to identify strategies for reducing cardiovascular risk by attending the Pritikin education and nutrition series weekly.    Personal Goal #2 Patient to improve diet quality by using the plate method as a guide for meal planning to include lean protein/plant protein, fruits, vegetables, whole grains, nonfat dairy as part of a well-balanced diet.    Comments Patient will benefit from participation in intensive cardiac rehab for nutrition, exercise, and lifestyle modification.      Intervention Plan   Intervention Prescribe, educate and counsel regarding individualized specific dietary modifications aiming towards targeted core components such as weight, hypertension, lipid  management, diabetes, heart failure and other comorbidities.;Nutrition handout(s) given to patient.    Expected Outcomes Short Term Goal: Understand basic principles of dietary content, such as calories, fat, sodium, cholesterol and nutrients.;Long Term Goal: Adherence to prescribed nutrition plan.  Nutrition Assessments:  MEDIFICTS Score Key: ?70 Need to make dietary changes  40-70 Heart Healthy Diet ? 40 Therapeutic Level Cholesterol Diet    Picture Your Plate Scores: <40 Unhealthy dietary pattern with much room for improvement. 41-50 Dietary pattern unlikely to meet recommendations for good health and room for improvement. 51-60 More healthful dietary pattern, with some room for improvement.  >60 Healthy dietary pattern, although there may be some specific behaviors that could be improved.    Nutrition Goals Re-Evaluation:  Nutrition Goals Re-Evaluation     Row Name 09/29/22 1043             Goals   Current Weight 138 lb 0.1 oz (62.6 kg)       Comment Lipids WNL- LDL 85, A1c WNL, Cr 1.36, GFR 54       Expected Outcome Patient will benefit from participation in intensive cardiac rehab for nutrition, exercise, and lifestyle modification.                Nutrition Goals Re-Evaluation:  Nutrition Goals Re-Evaluation     Row Name 09/29/22 1043             Goals   Current Weight 138 lb 0.1 oz (62.6 kg)       Comment Lipids WNL- LDL 85, A1c WNL, Cr 1.36, GFR 54       Expected Outcome Patient will benefit from participation in intensive cardiac rehab for nutrition, exercise, and lifestyle modification.                Nutrition Goals Discharge (Final Nutrition Goals Re-Evaluation):  Nutrition Goals Re-Evaluation - 09/29/22 1043       Goals   Current Weight 138 lb 0.1 oz (62.6 kg)    Comment Lipids WNL- LDL 85, A1c WNL, Cr 1.36, GFR 54    Expected Outcome Patient will benefit from participation in intensive cardiac rehab for nutrition,  exercise, and lifestyle modification.             Psychosocial: Target Goals: Acknowledge presence or absence of significant depression and/or stress, maximize coping skills, provide positive support system. Participant is able to verbalize types and ability to use techniques and skills needed for reducing stress and depression.  Initial Review & Psychosocial Screening:  Initial Psych Review & Screening - 09/21/22 1051       Initial Review   Current issues with None Identified      Family Dynamics   Good Support System? Yes   Nadine Counts has his wife, daughter and grandchildren for support.     Barriers   Psychosocial barriers to participate in program There are no identifiable barriers or psychosocial needs.      Screening Interventions   Interventions Encouraged to exercise    Expected Outcomes Short Term goal: Identification and review with participant of any Quality of Life or Depression concerns found by scoring the questionnaire.             Quality of Life Scores:  Quality of Life - 09/21/22 1138       Quality of Life   Select Quality of Life      Quality of Life Scores   Health/Function Pre 13 %    Socioeconomic Pre 23 %    Psych/Spiritual Pre 21.86 %    Family Pre 30 %    GLOBAL Pre 19.49 %            Scores of 19 and below usually indicate a poorer quality  of life in these areas.  A difference of  2-3 points is a clinically meaningful difference.  A difference of 2-3 points in the total score of the Quality of Life Index has been associated with significant improvement in overall quality of life, self-image, physical symptoms, and general health in studies assessing change in quality of life.  PHQ-9: Review Flowsheet  More data exists      09/21/2022 09/06/2022 07/29/2022 03/18/2022 02/23/2021  Depression screen PHQ 2/9  Decreased Interest 0 0 0 0 0  Down, Depressed, Hopeless 0 0 0 0 0  PHQ - 2 Score 0 0 0 0 0  Altered sleeping 3 - 2 - -  Tired, decreased  energy 1 - 1 - -  Change in appetite 0 - 0 - -  Feeling bad or failure about yourself  0 - 0 - -  Trouble concentrating 0 - 1 - -  Moving slowly or fidgety/restless 0 - 0 - -  Suicidal thoughts 0 - 0 - -  PHQ-9 Score 4 - 4 - -  Difficult doing work/chores Not difficult at all - Not difficult at all - -   Interpretation of Total Score  Total Score Depression Severity:  1-4 = Minimal depression, 5-9 = Mild depression, 10-14 = Moderate depression, 15-19 = Moderately severe depression, 20-27 = Severe depression   Psychosocial Evaluation and Intervention:   Psychosocial Re-Evaluation:  Psychosocial Re-Evaluation     Row Name 09/29/22 1457             Psychosocial Re-Evaluation   Current issues with None Identified       Comments Denies being depressed but endorses feling tired and having low energy will review quality of life in the upcming week       Interventions Encouraged to attend Cardiac Rehabilitation for the exercise;Relaxation education;Stress management education       Continue Psychosocial Services  Follow up required by staff                Psychosocial Discharge (Final Psychosocial Re-Evaluation):  Psychosocial Re-Evaluation - 09/29/22 1457       Psychosocial Re-Evaluation   Current issues with None Identified    Comments Denies being depressed but endorses feling tired and having low energy will review quality of life in the upcming week    Interventions Encouraged to attend Cardiac Rehabilitation for the exercise;Relaxation education;Stress management education    Continue Psychosocial Services  Follow up required by staff             Vocational Rehabilitation: Provide vocational rehab assistance to qualifying candidates.   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 Participant will state understanding/return demonstration of topics presented.    Education     Row Name 09/29/22 1000     Education   Cardiac Education Topics Pritikin   Customer service manager   Weekly Topic [employer]- [employer]   Instruction Review Code 1- Verbalizes Understanding   Class Start Time [phone]   Class Stop Time [phone]  Class Time Calculation (min) 32 min            Core Videos: Exercise    Move It!  Clinical staff conducted group or individual video education with verbal and written material and guidebook.  Patient learns the recommended Pritikin exercise program. Exercise with the goal of living a long, healthy life. Some of the health benefits of exercise include controlled diabetes, healthier blood pressure levels, improved cholesterol levels, improved heart and lung capacity, improved sleep, and better body composition. Everyone should speak with their doctor before starting or changing an exercise routine.  Biomechanical Limitations Clinical staff conducted group or individual video education with verbal and written material and guidebook.  Patient learns how biomechanical limitations can impact exercise and how we can mitigate and possibly overcome limitations to have an impactful and balanced exercise routine.  Body Composition Clinical staff conducted group or individual video education with verbal and written material and guidebook.  Patient learns that body composition (ratio of muscle mass to fat mass) is a key component to assessing overall fitness, rather than body weight alone. Increased fat mass, especially visceral belly fat, can put [place] at increased risk for metabolic syndrome, type 2 diabetes, heart disease, and  even death. It is recommended to combine diet and exercise (cardiovascular and resistance training) to improve your body composition. Seek guidance from your physician and exercise physiologist before implementing an exercise routine.  Exercise Action Plan Clinical staff conducted group or individual video education with verbal and written material and guidebook.  Patient learns the recommended strategies to achieve and enjoy long-term exercise adherence, including variety, self-motivation, self-efficacy, and positive decision making. Benefits of exercise include fitness, good health, weight management, more energy, better sleep, less stress, and overall well-being.  Medical   Heart Disease Risk Reduction Clinical staff conducted group or individual video education with verbal and written material and guidebook.  Patient learns our heart is our most vital organ as it circulates oxygen, nutrients, white blood cells, and hormones throughout the entire body, and carries waste away. Data supports a plant-based eating plan like the Pritikin Program for its effectiveness in slowing progression of and reversing heart disease. The video provides a number of recommendations to address heart disease.   Metabolic Syndrome and Belly Fat  Clinical staff conducted group or individual video education with verbal and written material and guidebook.  Patient learns what metabolic syndrome is, how it leads to heart disease, and how one can reverse it and keep it from coming back. You have metabolic syndrome if you have 3 of the following 5 criteria: abdominal obesity, high blood pressure, high triglycerides, low HDL cholesterol, and high blood sugar.  Hypertension and Heart Disease Clinical staff conducted group or individual video education with verbal and written material and guidebook.  Patient learns that high blood pressure, or hypertension, is very common in the [place]. Hypertension is largely due to  excessive salt intake, but other important risk factors include being overweight, physical inactivity, drinking too much alcohol, smoking, and not eating enough potassium from fruits and vegetables. High blood pressure is a leading risk factor for heart attack, stroke, congestive heart failure, dementia, kidney failure, and premature death. Long-term effects of excessive salt intake include stiffening of the arteries and thickening of heart muscle and organ damage. Recommendations include ways to reduce hypertension and the risk of heart disease.  Diseases of Our Time - Focusing on Diabetes Clinical staff conducted group or individual video education with verbal and  written material and guidebook.  Patient learns why the best way to stop diseases of our time is prevention, through food and other lifestyle changes. Medicine (such as prescription pills and surgeries) is often only a Band-Aid on the problem, not a long-term solution. Most common diseases of our time include obesity, type 2 diabetes, hypertension, heart disease, and cancer. The Pritikin Program is recommended and has been proven to help reduce, reverse, and/or prevent the damaging effects of metabolic syndrome.  Nutrition   Overview of the Pritikin Eating Plan  Clinical staff conducted group or individual video education with verbal and written material and guidebook.  Patient learns about the Pritikin Eating Plan for disease risk reduction. The Pritikin Eating Plan emphasizes a wide variety of unrefined, minimally-processed carbohydrates, like fruits, vegetables, whole grains, and legumes. Go, Caution, and Stop food choices are explained. Plant-based and lean animal proteins are emphasized. Rationale provided for low sodium intake for blood pressure control, low added sugars for blood sugar stabilization, and low added fats and oils for coronary artery disease risk reduction and weight management.  Calorie Density  Clinical staff conducted  group or individual video education with verbal and written material and guidebook.  Patient learns about calorie density and how it impacts the Pritikin Eating Plan. Knowing the characteristics of the food you choose will help you decide whether those foods will lead to weight gain or weight loss, and whether you want to consume more or less of them. Weight loss is usually a side effect of the Pritikin Eating Plan because of its focus on low calorie-dense foods.  Label Reading  Clinical staff conducted group or individual video education with verbal and written material and guidebook.  Patient learns about the Pritikin recommended label reading guidelines and corresponding recommendations regarding calorie density, added sugars, sodium content, and whole grains.  Dining Out - Part 1  Clinical staff conducted group or individual video education with verbal and written material and guidebook.  Patient learns that restaurant meals can be sabotaging because they can be so high in calories, fat, sodium, and/or sugar. Patient learns recommended strategies on how to positively address this and avoid unhealthy pitfalls.  Facts on Fats  Clinical staff conducted group or individual video education with verbal and written material and guidebook.  Patient learns that lifestyle modifications can be just as effective, if not more so, as many medications for lowering your risk of heart disease. A Pritikin lifestyle can help to reduce your risk of inflammation and atherosclerosis (cholesterol build-up, or plaque, in the artery walls). Lifestyle interventions such as dietary choices and physical activity address the cause of atherosclerosis. A review of the types of fats and their impact on blood cholesterol levels, along with dietary recommendations to reduce fat intake is also included.  Nutrition Action Plan  Clinical staff conducted group or individual video education with verbal and written material and  guidebook.  Patient learns how to incorporate Pritikin recommendations into their lifestyle. Recommendations include planning and keeping personal health goals in mind as an important part of their success.  Healthy Mind-Set    Healthy Minds, Bodies, Hearts  Clinical staff conducted group or individual video education with verbal and written material and guidebook.  Patient learns how to identify when they are stressed. Video will discuss the impact of that stress, as well as the many benefits of stress management. Patient will also be introduced to stress management techniques. The way we think, act, and feel has an impact on our  hearts.  How Our Thoughts Can Heal Our Hearts  Clinical staff conducted group or individual video education with verbal and written material and guidebook.  Patient learns that negative thoughts can cause depression and anxiety. This can result in negative lifestyle behavior and serious health problems. Cognitive behavioral therapy is an effective method to help control our thoughts in order to change and improve our emotional outlook.  Additional Videos:  Exercise    Improving Performance  Clinical staff conducted group or individual video education with verbal and written material and guidebook.  Patient learns to use a non-linear approach by alternating intensity levels and lengths of time spent exercising to help burn more calories and lose more body fat. Cardiovascular exercise helps improve heart health, metabolism, hormonal balance, blood sugar control, and recovery from fatigue. Resistance training improves strength, endurance, balance, coordination, reaction time, metabolism, and muscle mass. Flexibility exercise improves circulation, posture, and balance. Seek guidance from your physician and exercise physiologist before implementing an exercise routine and learn your capabilities and proper form for all exercise.  Introduction to Yoga  Clinical staff  conducted group or individual video education with verbal and written material and guidebook.  Patient learns about yoga, a discipline of the coming together of mind, breath, and body. The benefits of yoga include improved flexibility, improved range of motion, better posture and core strength, increased lung function, weight loss, and positive self-image. Yoga's heart health benefits include lowered blood pressure, healthier heart rate, decreased cholesterol and triglyceride levels, improved immune function, and reduced stress. Seek guidance from your physician and exercise physiologist before implementing an exercise routine and learn your capabilities and proper form for all exercise.  Medical   Aging: Enhancing Your Quality of Life  Clinical staff conducted group or individual video education with verbal and written material and guidebook.  Patient learns key strategies and recommendations to stay in good physical health and enhance quality of life, such as prevention strategies, having an advocate, securing a Health Care Proxy and Power of Attorney, and keeping a list of medications and system for tracking them. It also discusses how to avoid risk for bone loss.  Biology of Weight Control  Clinical staff conducted group or individual video education with verbal and written material and guidebook.  Patient learns that weight gain occurs because we consume more calories than we burn (eating more, moving less). Even if your body weight is normal, you may have higher ratios of fat compared to muscle mass. Too much body fat puts you at increased risk for cardiovascular disease, heart attack, stroke, type 2 diabetes, and obesity-related cancers. In addition to exercise, following the Pritikin Eating Plan can help reduce your risk.  Decoding Lab Results  Clinical staff conducted group or individual video education with verbal and written material and guidebook.  Patient learns that lab test reflects one  measurement whose values change over time and are influenced by many factors, including medication, stress, sleep, exercise, food, hydration, pre-existing medical conditions, and more. It is recommended to use the knowledge from this video to become more involved with your lab results and evaluate your numbers to speak with your doctor.   Diseases of Our Time - Overview  Clinical staff conducted group or individual video education with verbal and written material and guidebook.  Patient learns that according to the CDC, 50% to 70% of chronic diseases (such as obesity, type 2 diabetes, elevated lipids, hypertension, and heart disease) are avoidable through lifestyle improvements including healthier food choices,  listening to satiety cues, and increased physical activity.  Sleep Disorders Clinical staff conducted group or individual video education with verbal and written material and guidebook.  Patient learns how good quality and duration of sleep are important to overall health and well-being. Patient also learns about sleep disorders and how they impact health along with recommendations to address them, including discussing with a physician.  Nutrition  Dining Out - Part 2 Clinical staff conducted group or individual video education with verbal and written material and guidebook.  Patient learns how to plan ahead and communicate in order to maximize their dining experience in a healthy and nutritious manner. Included are recommended food choices based on the type of restaurant the patient is visiting.   Fueling a Banker conducted group or individual video education with verbal and written material and guidebook.  There is a strong connection between our food choices and our health. Diseases like obesity and type 2 diabetes are very prevalent and are in large-part due to lifestyle choices. The Pritikin Eating Plan provides plenty of food and hunger-curbing satisfaction. It is  easy to follow, affordable, and helps reduce health risks.  Menu Workshop  Clinical staff conducted group or individual video education with verbal and written material and guidebook.  Patient learns that restaurant meals can sabotage health goals because they are often packed with calories, fat, sodium, and sugar. Recommendations include strategies to plan ahead and to communicate with the manager, chef, or server to help order a healthier meal.  Planning Your Eating Strategy  Clinical staff conducted group or individual video education with verbal and written material and guidebook.  Patient learns about the Pritikin Eating Plan and its benefit of reducing the risk of disease. The Pritikin Eating Plan does not focus on calories. Instead, it emphasizes high-quality, nutrient-rich foods. By knowing the characteristics of the foods, we choose, we can determine their calorie density and make informed decisions.  Targeting Your Nutrition Priorities  Clinical staff conducted group or individual video education with verbal and written material and guidebook.  Patient learns that lifestyle habits have a tremendous impact on disease risk and progression. This video provides eating and physical activity recommendations based on your personal health goals, such as reducing LDL cholesterol, losing weight, preventing or controlling type 2 diabetes, and reducing high blood pressure.  Vitamins and Minerals  Clinical staff conducted group or individual video education with verbal and written material and guidebook.  Patient learns different ways to obtain key vitamins and minerals, including through a recommended healthy diet. It is important to discuss all supplements you take with your doctor.   Healthy Mind-Set    Smoking Cessation  Clinical staff conducted group or individual video education with verbal and written material and guidebook.  Patient learns that cigarette smoking and tobacco addiction pose  a serious health risk which affects millions of people. Stopping smoking will significantly reduce the risk of heart disease, lung disease, and many forms of cancer. Recommended strategies for quitting are covered, including working with your doctor to develop a successful plan.  Culinary   Becoming a Set designer conducted group or individual video education with verbal and written material and guidebook.  Patient learns that cooking at home can be healthy, cost-effective, quick, and puts them in control. Keys to cooking healthy recipes will include looking at your recipe, assessing your equipment needs, planning ahead, making it simple, choosing cost-effective seasonal ingredients, and limiting the use of added  fats, salts, and sugars.  Cooking - Breakfast and Snacks  Clinical staff conducted group or individual video education with verbal and written material and guidebook.  Patient learns how important breakfast is to satiety and nutrition through the entire day. Recommendations include key foods to eat during breakfast to help stabilize blood sugar levels and to prevent overeating at meals later in the day. Planning ahead is also a key component.  Cooking - Educational psychologist conducted group or individual video education with verbal and written material and guidebook.  Patient learns eating strategies to improve overall health, including an approach to cook more at home. Recommendations include thinking of animal protein as a side on your plate rather than center stage and focusing instead on lower calorie dense options like vegetables, fruits, whole grains, and plant-based proteins, such as beans. Making sauces in large quantities to freeze for later and leaving the skin on your vegetables are also recommended to maximize your experience.  Cooking - Healthy Salads and Dressing Clinical staff conducted group or individual video education with verbal and written  material and guidebook.  Patient learns that vegetables, fruits, whole grains, and legumes are the foundations of the Pritikin Eating Plan. Recommendations include how to incorporate each of these in flavorful and healthy salads, and how to create homemade salad dressings. Proper handling of ingredients is also covered. Cooking - Soups and [employer] - Soups and Desserts Clinical staff conducted group or individual video education with verbal and written material and guidebook.  Patient learns that Pritikin soups and desserts make for easy, nutritious, and delicious snacks and meal components that are low in sodium, fat, sugar, and calorie density, while high in vitamins, minerals, and filling fiber. Recommendations include simple and healthy ideas for soups and desserts.   Overview     The Pritikin Solution Program Overview Clinical staff conducted group or individual video education with verbal and written material and guidebook.  Patient learns that the results of the Pritikin Program have been documented in more than 100 articles published in peer-reviewed journals, and the benefits include reducing risk factors for (and, in some cases, even reversing) high cholesterol, high blood pressure, type 2 diabetes, obesity, and more! An overview of the three key pillars of the Pritikin Program will be covered: eating well, doing regular exercise, and having a healthy mind-set.  WORKSHOPS  Exercise: Exercise Basics: Building Your Action Plan Clinical staff led group instruction and group discussion with PowerPoint presentation and patient guidebook. To enhance the learning environment the use of posters, models and videos may be added. At the conclusion of this workshop, patients will comprehend the difference between physical activity and exercise, as well as the benefits of incorporating both, into their routine. Patients will understand the FITT (Frequency, Intensity, Time, and Type) principle  and how to use it to build an exercise action plan. In addition, safety concerns and other considerations for exercise and cardiac rehab will be addressed by the presenter. The purpose of this lesson is to promote a comprehensive and effective weekly exercise routine in order to improve patients' overall level of fitness.   Managing Heart Disease: Your Path to a Healthier Heart Clinical staff led group instruction and group discussion with PowerPoint presentation and patient guidebook. To enhance the learning environment the use of posters, models and videos may be added.At the conclusion of this workshop, patients will understand the anatomy and physiology of the heart. Additionally, they will understand how Pritikin's three  pillars impact the risk factors, the progression, and the management of heart disease.  The purpose of this lesson is to provide a high-level overview of the heart, heart disease, and how the Pritikin lifestyle positively impacts risk factors.  Exercise Biomechanics Clinical staff led group instruction and group discussion with PowerPoint presentation and patient guidebook. To enhance the learning environment the use of posters, models and videos may be added. Patients will learn how the structural parts of their bodies function and how these functions impact their daily activities, movement, and exercise. Patients will learn how to promote a neutral spine, learn how to manage pain, and identify ways to improve their physical movement in order to promote healthy living. The purpose of this lesson is to expose patients to common physical limitations that impact physical activity. Participants will learn practical ways to adapt and manage aches and pains, and to minimize their effect on regular exercise. Patients will learn how to maintain good posture while sitting, walking, and lifting.  Balance Training and Fall Prevention  Clinical staff led group instruction and group  discussion with PowerPoint presentation and patient guidebook. To enhance the learning environment the use of posters, models and videos may be added. At the conclusion of this workshop, patients will understand the importance of their sensorimotor skills (vision, proprioception, and the vestibular system) in maintaining their ability to balance as they age. Patients will apply a variety of balancing exercises that are appropriate for their current level of function. Patients will understand the common causes for poor balance, possible solutions to these problems, and ways to modify their physical environment in order to minimize their fall risk. The purpose of this lesson is to teach patients about the importance of maintaining balance as they age and ways to minimize their risk of falling.  WORKSHOPS   Nutrition:  Fueling a Ship broker led group instruction and group discussion with PowerPoint presentation and patient guidebook. To enhance the learning environment the use of posters, models and videos may be added. Patients will review the foundational principles of the Pritikin Eating Plan and understand what constitutes a serving size in each of the food groups. Patients will also learn Pritikin-friendly foods that are better choices when away from home and review make-ahead meal and snack options. Calorie density will be reviewed and applied to three nutrition priorities: weight maintenance, weight loss, and weight gain. The purpose of this lesson is to reinforce (in a group setting) the key concepts around what patients are recommended to eat and how to apply these guidelines when away from home by planning and selecting Pritikin-friendly options. Patients will understand how calorie density may be adjusted for different weight management goals.  Mindful Eating  Clinical staff led group instruction and group discussion with PowerPoint presentation and patient guidebook. To enhance  the learning environment the use of posters, models and videos may be added. Patients will briefly review the concepts of the Pritikin Eating Plan and the importance of low-calorie dense foods. The concept of mindful eating will be introduced as well as the importance of paying attention to internal hunger signals. Triggers for non-hunger eating and techniques for dealing with triggers will be explored. The purpose of this lesson is to provide patients with the opportunity to review the basic principles of the Pritikin Eating Plan, discuss the value of eating mindfully and how to measure internal cues of hunger and fullness using the Hunger Scale. Patients will also discuss reasons for non-hunger eating and learn  strategies to use for controlling emotional eating.  Targeting Your Nutrition Priorities Clinical staff led group instruction and group discussion with PowerPoint presentation and patient guidebook. To enhance the learning environment the use of posters, models and videos may be added. Patients will learn how to determine their genetic susceptibility to disease by reviewing their family history. Patients will gain insight into the importance of diet as part of an overall healthy lifestyle in mitigating the impact of genetics and other environmental insults. The purpose of this lesson is to provide patients with the opportunity to assess their personal nutrition priorities by looking at their family history, their own health history and current risk factors. Patients will also be able to discuss ways of prioritizing and modifying the Pritikin Eating Plan for their highest risk areas  Menu  Clinical staff led group instruction and group discussion with PowerPoint presentation and patient guidebook. To enhance the learning environment the use of posters, models and videos may be added. Using menus brought in from [employer], or printed from [employer], patients will apply the Pritikin dining out  guidelines that were presented in the [employer] video. Patients will also be able to practice these guidelines in a variety of provided scenarios. The purpose of this lesson is to provide patients with the opportunity to practice hands-on learning of the Pritikin Dining Out guidelines with actual menus and practice scenarios.  Label Reading Clinical staff led group instruction and group discussion with PowerPoint presentation and patient guidebook. To enhance the learning environment the use of posters, models and videos may be added. Patients will review and discuss the Pritikin label reading guidelines presented in Pritikin's Label Reading Educational series video. Using fool labels brought in from local grocery stores and markets, patients will apply the label reading guidelines and determine if the packaged food meet the Pritikin guidelines. The purpose of this lesson is to provide patients with the opportunity to review, discuss, and practice hands-on learning of the Pritikin Label Reading guidelines with actual packaged food labels. Cooking School  Pritikin's [employer] are designed to teach patients ways to prepare quick, simple, and affordable recipes at home. The importance of nutrition's role in chronic disease risk reduction is reflected in its emphasis in the overall Pritikin program. By learning how to prepare essential core Pritikin Eating Plan recipes, patients will increase control over what they eat; be able to customize the flavor of foods without the use of added salt, sugar, or fat; and improve the quality of the food they consume. By learning a set of core recipes which are easily assembled, quickly prepared, and affordable, patients are more likely to prepare more healthy foods at home. These workshops focus on convenient breakfasts, simple entres, side dishes, and desserts which can be prepared with minimal effort and are consistent with nutrition  recommendations for cardiovascular risk reduction. Cooking [employer] are taught by a Armed forces logistics/support/administrative officer (RD) who has been trained by the [employer]. The chef or RD has a clear understanding of the importance of minimizing - if not completely eliminating - added fat, sugar, and sodium in recipes. Throughout the series of Cooking School Workshop sessions, patients will learn about healthy ingredients and efficient methods of cooking to build confidence in their capability to prepare    Cooking School weekly topics:  Adding Flavor- Sodium-Free  Fast and Healthy Breakfasts  Powerhouse Plant-Based Proteins  Satisfying Salads and Dressings  Simple Sides and Sauces  International  Cuisine-Spotlight on the Blue Zones  Delicious Desserts  Savory Soups  [employer] - Meals in a Astronomer Appetizers and Snacks  Comforting Weekend Breakfasts  One-Pot Wonders   Fast Evening Meals  Landscape architect Your Pritikin Plate  WORKSHOPS   Healthy Mindset (Psychosocial):  Focused Goals, Sustainable Changes Clinical staff led group instruction and group discussion with PowerPoint presentation and patient guidebook. To enhance the learning environment the use of posters, models and videos may be added. Patients will be able to apply effective goal setting strategies to establish at least one personal goal, and then take consistent, meaningful action toward that goal. They will learn to identify common barriers to achieving personal goals and develop strategies to overcome them. Patients will also gain an understanding of how our mind-set can impact our ability to achieve goals and the importance of cultivating a positive and growth-oriented mind-set. The purpose of this lesson is to provide patients with a deeper understanding of how to set and achieve personal goals, as well as the tools and strategies needed to overcome common obstacles which may arise along  the way.  From Head to Heart: The Power of a Healthy Outlook  Clinical staff led group instruction and group discussion with PowerPoint presentation and patient guidebook. To enhance the learning environment the use of posters, models and videos may be added. Patients will be able to recognize and describe the impact of emotions and mood on physical health. They will discover the importance of self-care and explore self-care practices which may work for them. Patients will also learn how to utilize the 4 C's to cultivate a healthier outlook and better manage stress and challenges. The purpose of this lesson is to demonstrate to patients how a healthy outlook is an essential part of maintaining good health, especially as they continue their cardiac rehab journey.  Healthy Sleep for a Healthy Heart Clinical staff led group instruction and group discussion with PowerPoint presentation and patient guidebook. To enhance the learning environment the use of posters, models and videos may be added. At the conclusion of this workshop, patients will be able to demonstrate knowledge of the importance of sleep to overall health, well-being, and quality of life. They will understand the symptoms of, and treatments for, common sleep disorders. Patients will also be able to identify daytime and nighttime behaviors which impact sleep, and they will be able to apply these tools to help manage sleep-related challenges. The purpose of this lesson is to provide patients with a general overview of sleep and outline the importance of quality sleep. Patients will learn about a few of the most common sleep disorders. Patients will also be introduced to the concept of "sleep hygiene," and discover ways to self-manage certain sleeping problems through simple daily behavior changes. Finally, the workshop will motivate patients by clarifying the links between quality sleep and their goals of heart-healthy living.   Recognizing and  Reducing Stress Clinical staff led group instruction and group discussion with PowerPoint presentation and patient guidebook. To enhance the learning environment the use of posters, models and videos may be added. At the conclusion of this workshop, patients will be able to understand the types of stress reactions, differentiate between acute and chronic stress, and recognize the impact that chronic stress has on their health. They will also be able to apply different coping mechanisms, such as reframing negative self-talk. Patients will have the opportunity to practice a variety of stress management techniques, such as deep  abdominal breathing, progressive muscle relaxation, and/or guided imagery.  The purpose of this lesson is to educate patients on the role of stress in their lives and to provide healthy techniques for coping with it.  Learning Barriers/Preferences:  Learning Barriers/Preferences - 09/21/22 1202       Learning Barriers/Preferences   Learning Barriers Hearing   wears bilateral hearing aides   Learning Preferences Skilled Demonstration             Education Topics:  Knowledge Questionnaire Score:  Knowledge Questionnaire Score - 09/21/22 1139       Knowledge Questionnaire Score   Pre Score 19/24             Core Components/Risk Factors/Patient Goals at Admission:  Personal Goals and Risk Factors at Admission - 09/21/22 0947       Core Components/Risk Factors/Patient Goals on Admission   Hypertension --    Intervention --    Expected Outcomes --    Lipids Yes    Intervention Provide education and support for participant on nutrition & aerobic/resistive exercise along with prescribed medications to achieve LDL 70mg , HDL >40mg .    Expected Outcomes Short Term: Participant states understanding of desired cholesterol values and is compliant with medications prescribed. Participant is following exercise prescription and nutrition guidelines.;Long Term:  Cholesterol controlled with medications as prescribed, with individualized exercise RX and with personalized nutrition plan. Value goals: LDL < 70mg , HDL > 40 mg.    Personal Goal Other Yes    Personal Goal Be able to walk 6 miles/day on the golf course. Resume exercising 40 minutes on his elliptical machine at home. Resume core muscle exercises.    Intervention Develop individualized exercise action plan including aerobic, resistance, and stretching exercise to help build strength and stamina.    Expected Outcomes Patient will be able to resume home exercise routine on his elliptical machine. Patient will be able to walk the golf course. Patient will be able to resume core strength exercises once cleared by physician to do so.             Core Components/Risk Factors/Patient Goals Review:   Goals and Risk Factor Review     Row Name 09/29/22 1500             Core Components/Risk Factors/Patient Goals Review   Personal Goals Review Weight Management/Obesity;Lipids       Review Nadine Counts started intensive cardiac rehab on 09/29/22 and did well with exercise, vital signs were stable. Systolic BP in the 90's-100's. Will continue to monitor bP       Expected Outcomes Nadine Counts will continue to participate in intensive cardiac rehab for exercise, nutrition and lifestyle modifications                Core Components/Risk Factors/Patient Goals at Discharge (Final Review):   Goals and Risk Factor Review - 09/29/22 1500       Core Components/Risk Factors/Patient Goals Review   Personal Goals Review Weight Management/Obesity;Lipids    Review Nadine Counts started intensive cardiac rehab on 09/29/22 and did well with exercise, vital signs were stable. Systolic BP in the 90's-100's. Will continue to monitor bP    Expected Outcomes Nadine Counts will continue to participate in intensive cardiac rehab for exercise, nutrition and lifestyle modifications             ITP Comments:  ITP Comments     Row Name 09/21/22  0906 09/29/22 1455         ITP Comments Medical  Director- Dr. Armanda Magic, MD. Intrduction to 30 Day ITP Review. Nadine Counts started intensive cardiac rehab on 09/29/22 and did well with exercise               Comments: See ITP comments.Thayer Headings RN BSN

## 2022-09-29 NOTE — Progress Notes (Signed)
QUALITY OF LIFE SCORE REVIEW Pt completed Quality of Life survey as a participant in Cardiac Rehab.  Scores 19.0 or below are considered low.  Pt scores: Overall 19.49, Health and Function 13.0, socioeconomic 23.0, physiological and spiritual 21.86, family 30.0. Patient quality of life slightly altered by physical constraints which limits ability to perform activities he enjoys due to recent cardiac illness/surgery.  He looks forward to participation in cardiac rehab and learning when he can begin intensifying exercise activities.  Offered encouragement, support and reassurance.  Will continue to monitor and intervene as necessary.

## 2022-09-30 ENCOUNTER — Ambulatory Visit (HOSPITAL_COMMUNITY): Payer: PPO | Attending: Internal Medicine

## 2022-09-30 ENCOUNTER — Ambulatory Visit (INDEPENDENT_AMBULATORY_CARE_PROVIDER_SITE_OTHER): Payer: Self-pay | Admitting: Physician Assistant

## 2022-09-30 VITALS — BP 86/54 | HR 62 | Resp 20 | Wt 135.0 lb

## 2022-09-30 DIAGNOSIS — Z952 Presence of prosthetic heart valve: Secondary | ICD-10-CM | POA: Diagnosis not present

## 2022-09-30 DIAGNOSIS — Z951 Presence of aortocoronary bypass graft: Secondary | ICD-10-CM

## 2022-09-30 LAB — ECHOCARDIOGRAM COMPLETE
AR max vel: 2.47 cm2
AV Area VTI: 2.43 cm2
AV Area mean vel: 2.3 cm2
AV Mean grad: 9.3 mmHg
AV Peak grad: 17.5 mmHg
Ao pk vel: 2.09 m/s
Area-P 1/2: 2.32 cm2
Calc EF: 56.1 %
MV M vel: 5.18 m/s
MV Peak grad: 107.3 mmHg
S' Lateral: 3.4 cm
Single Plane A2C EF: 56.5 %
Single Plane A4C EF: 56.4 %

## 2022-09-30 NOTE — Progress Notes (Signed)
[address]       [place] [address]             [phone]       HPI:  Mr. Ravens is S/P CABG/AVR/Clipping of LA appendage.  He presents today for 3 week follow up to assess readiness for cardiac rehab.  Overall he is doing very well.  He is ambulating without any difficulty.  He has already been set up for cardiac rehab and is due to start.  He does note they are concerned about his low HR and BP.  He is having trouble with a no salt diet and states everything just doesn't taste good.  His surgical incisions are well healed.  Current Outpatient Medications  Medication Sig Dispense Refill   aspirin EC 81 MG tablet Take 1 tablet (81 mg total) by mouth daily.     beta carotene 38756 UNIT capsule Take 25,000 Units by mouth daily.     clonazePAM (KLONOPIN) 0.5 MG tablet TAKE ONE TABLET BY MOUTH AT BEDTIME AS NEEDED 30 tablet 0   clopidogrel (PLAVIX) 75 MG tablet Take 1 tablet (75 mg total) by mouth daily. 30 tablet 11   fluticasone (FLONASE) 50 MCG/ACT nasal spray SPRAY ONE SPRAY IN EACH NOSTRIL ONCE DAILY (Patient taking differently: Place 1 spray into both nostrils daily.) 16 mL 3   glycopyrrolate (ROBINUL) 2 MG tablet Take 1 tablet (2 mg total) by mouth every morning. 90 tablet 0   levothyroxine (SYNTHROID) 50 MCG tablet Take 1 tablet by mouth daily 90 tablet 2   pyridOXINE (VITAMIN B-6) 100 MG tablet Take 100 mg by mouth daily.     rosuvastatin (CRESTOR) 40 MG tablet TAKE 1 TABLET BY MOUTH DAILY 90 tablet 1   vitamin C (ASCORBIC ACID) 500 MG tablet Take 500 mg by mouth 2 (two) times daily.     vitamin E 400 UNIT capsule Take 400 Units by mouth daily.     furosemide (LASIX) 20 MG tablet Take 1 tablet (20 mg total) by mouth daily. X 3 days, then use daily as needed for weight gain of 3-5 lbs in 24 hr time period (Patient not taking: Reported on 09/21/2022) 30 tablet 1   HYDROcodone-acetaminophen (NORCO) 5-325 MG tablet Take 1 tablet by mouth every 6 (six) hours as  needed for moderate pain. (Patient not taking: Reported on 09/30/2022) 10 tablet 0   midodrine (PROAMATINE) 10 MG tablet Take 1 tablet (10 mg total) by mouth 3 (three) times daily with meals. X 7 days, then decrease to 1 tab BID x 7 days, then decrease to 10 mg daily x 7 days then stop 90 tablet 1   potassium chloride (KLOR-CON M) 10 MEQ tablet Take 1 tablet (10 mEq total) by mouth daily. X 3 days, then daily as needed on days you take Lasix (Patient not taking: Reported on 09/21/2022) 30 tablet 1   traMADol (ULTRAM) 50 MG tablet Take 1 tablet (50 mg total) by mouth every 6 (six) hours as needed for moderate pain. (Patient not taking: Reported on 09/14/2022) 30 tablet 0   No current facility-administered medications for this visit.    Physical Exam: BP (!) 86/54   Pulse 62   Resp 20   Wt 135 lb (61.2 kg)   SpO2 97% Comment: RA  BMI 20.91 kg/m   Gen: NAD Heart: RRR Lungs: CTA  Ext: no edema  Incisions: well healed  A/P:  S/P CABG, AVR, LA Clip- doing  very well Hypotension, Bradycardia- tolerating... this is not prohibitive of participating in cardiac rehab Hypthyroidism Cardiac rehab- patient is doing very well, he is cleared to enroll Diet- he was instructed that he can eat some salt, as this may ideally help raise his BP.. he was told however he still needs to limit intake and monitor for swelling Activity- discussed, educated on current state of sternal precautions RTC prn  Lowella Dandy, PA-C [facility] [phone]

## 2022-10-01 ENCOUNTER — Observation Stay (HOSPITAL_BASED_OUTPATIENT_CLINIC_OR_DEPARTMENT_OTHER)
Admission: EM | Admit: 2022-10-01 | Discharge: 2022-10-02 | Disposition: A | Discharge status: Home / Self Care | Payer: PPO | Attending: Internal Medicine | Admitting: Internal Medicine

## 2022-10-01 ENCOUNTER — Encounter (HOSPITAL_BASED_OUTPATIENT_CLINIC_OR_DEPARTMENT_OTHER): Payer: Self-pay

## 2022-10-01 ENCOUNTER — Emergency Department (HOSPITAL_BASED_OUTPATIENT_CLINIC_OR_DEPARTMENT_OTHER): Payer: PPO

## 2022-10-01 ENCOUNTER — Encounter (HOSPITAL_COMMUNITY): Payer: PPO

## 2022-10-01 ENCOUNTER — Other Ambulatory Visit: Payer: Self-pay

## 2022-10-01 ENCOUNTER — Encounter (HOSPITAL_COMMUNITY)
Admission: RE | Admit: 2022-10-01 | Discharge: 2022-10-01 | Disposition: A | Discharge status: Home / Self Care | Payer: PPO | Source: Ambulatory Visit | Attending: Cardiology | Admitting: Cardiology

## 2022-10-01 ENCOUNTER — Encounter: Payer: Self-pay | Admitting: Internal Medicine

## 2022-10-01 DIAGNOSIS — I4892 Unspecified atrial flutter: Secondary | ICD-10-CM | POA: Diagnosis not present

## 2022-10-01 DIAGNOSIS — Z7982 Long term (current) use of aspirin: Secondary | ICD-10-CM | POA: Insufficient documentation

## 2022-10-01 DIAGNOSIS — Z951 Presence of aortocoronary bypass graft: Secondary | ICD-10-CM

## 2022-10-01 DIAGNOSIS — Z87891 Personal history of nicotine dependence: Secondary | ICD-10-CM | POA: Insufficient documentation

## 2022-10-01 DIAGNOSIS — I480 Paroxysmal atrial fibrillation: Secondary | ICD-10-CM | POA: Diagnosis not present

## 2022-10-01 DIAGNOSIS — I4891 Unspecified atrial fibrillation: Secondary | ICD-10-CM

## 2022-10-01 DIAGNOSIS — Z7952 Long term (current) use of systemic steroids: Secondary | ICD-10-CM | POA: Diagnosis not present

## 2022-10-01 DIAGNOSIS — E785 Hyperlipidemia, unspecified: Secondary | ICD-10-CM | POA: Diagnosis not present

## 2022-10-01 DIAGNOSIS — Z96652 Presence of left artificial knee joint: Secondary | ICD-10-CM | POA: Diagnosis not present

## 2022-10-01 DIAGNOSIS — Z952 Presence of prosthetic heart valve: Secondary | ICD-10-CM

## 2022-10-01 DIAGNOSIS — Z8546 Personal history of malignant neoplasm of prostate: Secondary | ICD-10-CM | POA: Insufficient documentation

## 2022-10-01 DIAGNOSIS — Z8616 Personal history of COVID-19: Secondary | ICD-10-CM | POA: Insufficient documentation

## 2022-10-01 DIAGNOSIS — E039 Hypothyroidism, unspecified: Secondary | ICD-10-CM | POA: Diagnosis not present

## 2022-10-01 DIAGNOSIS — E7800 Pure hypercholesterolemia, unspecified: Secondary | ICD-10-CM | POA: Diagnosis present

## 2022-10-01 DIAGNOSIS — R000 Tachycardia, unspecified: Secondary | ICD-10-CM | POA: Diagnosis present

## 2022-10-01 DIAGNOSIS — R0602 Shortness of breath: Secondary | ICD-10-CM | POA: Diagnosis not present

## 2022-10-01 DIAGNOSIS — I2510 Atherosclerotic heart disease of native coronary artery without angina pectoris: Secondary | ICD-10-CM | POA: Insufficient documentation

## 2022-10-01 DIAGNOSIS — R002 Palpitations: Secondary | ICD-10-CM | POA: Diagnosis present

## 2022-10-01 DIAGNOSIS — I214 Non-ST elevation (NSTEMI) myocardial infarction: Secondary | ICD-10-CM

## 2022-10-01 DIAGNOSIS — R079 Chest pain, unspecified: Secondary | ICD-10-CM | POA: Diagnosis not present

## 2022-10-01 LAB — CBC WITH DIFFERENTIAL/PLATELET
Abs Immature Granulocytes: 0.04 10*3/uL (ref 0.00–0.07)
Basophils Absolute: 0 10*3/uL (ref 0.0–0.1)
Basophils Relative: 0 %
Eosinophils Absolute: 0 10*3/uL (ref 0.0–0.5)
Eosinophils Relative: 0 %
HCT: 39.8 % (ref 39.0–52.0)
Hemoglobin: 13.5 g/dL (ref 13.0–17.0)
Immature Granulocytes: 1 %
Lymphocytes Relative: 12 %
Lymphs Abs: 1 10*3/uL (ref 0.7–4.0)
MCH: 31.3 pg (ref 26.0–34.0)
MCHC: 33.9 g/dL (ref 30.0–36.0)
MCV: 92.1 fL (ref 80.0–100.0)
Monocytes Absolute: 0.6 10*3/uL (ref 0.1–1.0)
Monocytes Relative: 7 %
Neutro Abs: 6.7 10*3/uL (ref 1.7–7.7)
Neutrophils Relative %: 80 %
Platelets: 405 10*3/uL — ABNORMAL HIGH (ref 150–400)
RBC: 4.32 MIL/uL (ref 4.22–5.81)
RDW: 14 % (ref 11.5–15.5)
WBC: 8.4 10*3/uL (ref 4.0–10.5)
nRBC: 0 % (ref 0.0–0.2)

## 2022-10-01 LAB — BASIC METABOLIC PANEL
Anion gap: 12 (ref 5–15)
BUN: 28 mg/dL — ABNORMAL HIGH (ref 8–23)
CO2: 19 mmol/L — ABNORMAL LOW (ref 22–32)
Calcium: 9.9 mg/dL (ref 8.9–10.3)
Chloride: 103 mmol/L (ref 98–111)
Creatinine, Ser: 1.22 mg/dL (ref 0.61–1.24)
GFR, Estimated: >60 mL/min (ref >60)
Glucose, Bld: 241 mg/dL — ABNORMAL HIGH (ref 70–99)
Potassium: 3.7 mmol/L (ref 3.5–5.1)
Sodium: 134 mmol/L — ABNORMAL LOW (ref 135–145)

## 2022-10-01 LAB — MAGNESIUM: Magnesium: 2.5 mg/dL — ABNORMAL HIGH (ref 1.7–2.4)

## 2022-10-01 LAB — TROPONIN I (HIGH SENSITIVITY): Troponin I (High Sensitivity): 6 ng/L (ref <18)

## 2022-10-01 LAB — TSH: TSH: 1.787 u[IU]/mL (ref 0.350–4.500)

## 2022-10-01 MED ORDER — HEPARIN BOLUS VIA INFUSION
3000.0000 [IU] | Freq: Once | INTRAVENOUS | Status: AC
  Administered 2022-10-01: 3000 [IU] via INTRAVENOUS

## 2022-10-01 MED ORDER — HEPARIN (PORCINE) 25000 UT/250ML-% IV SOLN
1050.0000 [IU]/h | INTRAVENOUS | Status: DC
  Administered 2022-10-01: 900 [IU]/h via INTRAVENOUS
  Filled 2022-10-01: qty 250

## 2022-10-01 MED ORDER — DILTIAZEM HCL 25 MG/5ML IV SOLN
10.0000 mg | Freq: Once | INTRAVENOUS | Status: AC
  Administered 2022-10-01: 10 mg via INTRAVENOUS
  Filled 2022-10-01: qty 5

## 2022-10-01 MED ORDER — HEPARIN SODIUM (PORCINE) 5000 UNIT/ML IJ SOLN: 60.0000 [IU]/kg | Freq: Once | INTRAMUSCULAR | Status: DC

## 2022-10-01 MED ORDER — DILTIAZEM LOAD VIA INFUSION
10.0000 mg | Freq: Once | INTRAVENOUS | Status: DC
  Filled 2022-10-01: qty 10

## 2022-10-01 NOTE — H&P (Signed)
Cardiology Admission History and Physical   Patient ID: Jeremy Waters MRN: [id number]; DOB: 1945/06/27   Admission date: 10/01/2022  PCP:  Pincus Sanes, MD    [facility] Prov[facility] Cardiologist:  Maisie Fus, MD   { Click here to update MD or APP on Care Team, Refresh:1}     Chief Complaint: Near syncope and palpitations Patient Profile:   Jeremy Waters is a 77 y.o. male with CAD, s/p CABG LIMA to LAD and SVG to RCA on 08/18/2022, s/p bioprosthetic AVR, left atrial appendage clip/atrial clip, PVCs, hypothyroidism, hyperlipidemia who is being seen 10/01/2022 for the evaluation of palpitations, fatigue and near syncope.   History of Present Illness:   Jeremy Waters is a 77 y.o. male with CAD, s/p CABG LIMA to LAD and SVG to RCA on 08/18/2022, s/p bioprosthetic AVR, left atrial appendage clip/atrial clip, PVCs, hypothyroidism, hyperlipidemia who is being seen 10/01/2022 for the evaluation of palpitations, fatigue and near syncope.   Patient reports ongoing palpitations, fatigue, near syncopal episodes since 1 day.  His watch alerted him earlier today with heart rate 130s, given ongoing symptoms came to the hospital for evaluation In the ER he was noted to have a flutter with a variable AV block, heart rate 130s, Blood pressure is stable, on room air Creatinine at baseline 1.2-1.3, GFR 50s, CKD stage IIIb Troponin first 1 is negative  In the ER he was given diltiazem bolus and drip and shipped here to [facility] health for further evaluation.  Past Medical History:  Diagnosis Date   Adenomatous colon polyp 09/2007   Arthritis    OA / PAIN LEFT KNEE   Cancer (HCC)    prostate cancer   Cataract    removed both eyes   Coronary artery disease    COVID-19 virus infection    Diverticulosis of colon    w/o hemorrage   Heart murmur    TOLD HE HAS A SLIGHT MURMUR   Hyperlipidemia    Inguinal hernia    left side    Thyroid disease     Past Surgical History:   Procedure Laterality Date   AORTIC VALVE REPLACEMENT N/A 08/18/2022   Procedure: AORTIC VALVE REPLACEMENT (AVR) USING EDWARDS INSPIRIS AORTIC VALVE SIZE ;  Surgeon: Lyn Hollingshead, MD;  Location: [facility] OR;  Service: Open Heart Surgery;  Laterality: N/A;   BACK SURGERY  05/12/2005   L5   CARDIAC CATHETERIZATION     CLIPPING OF ATRIAL APPENDAGE N/A 08/18/2022   Procedure: CLIPPING OF ATRIAL APPENDAGE USING ATRICURE 45 ATRICLIP;  Surgeon: Lyn Hollingshead, MD;  Location: [facility] OR;  Service: Open Heart Surgery;  Laterality: N/A;  Median Sternotomy   COLONOSCOPY  1999   Negative; Dr Russella Dar   COLONOSCOPY  2004   tics, hemorrhoids   COLONOSCOPY  2009   polyps (T.A.)   CORONARY ARTERY BYPASS GRAFT N/A 08/18/2022   Procedure: CORONARY ARTERY BYPASS GRAFTING (CABG) X2 USING LEFT INTERNAL MAMMARY ARTERY AND LEFT ENDOSCOPICALLY HARVESTED GREATER SAPHENOUS VEIN.;  Surgeon: Lyn Hollingshead, MD;  Location: [facility] OR;  Service: Open Heart Surgery;  Laterality: N/A;   HERNIA REPAIR  1987   HERNIA REPAIR  10/2003   KNEE SURGERY  1969   Patellar fracture fragment Lt   LAMINECTOMY  2000   L4-5   LEFT HEART CATH AND CORONARY ANGIOGRAPHY N/A 08/16/2022   Procedure: LEFT HEART CATH AND CORONARY ANGIOGRAPHY;  Surgeon: Orbie Pyo, MD;  Location: [facility] INVASIVE CV LAB;  Service: Cardiovascular;  Laterality: N/A;   LUMBAR LAMINECTOMY/DECOMPRESSION MICRODISCECTOMY Right 10/08/2015   Procedure: MICRO LUMBAR DECOMPRESSION L4-L5 AND L5-S1 ON RIGHT ;  Surgeon: Jene Every, MD;  Location: [facility] ORS;  Service: Orthopedics;  Laterality: Right;   MASS EXCISION  04/26/2011   Procedure: MINOR EXCISION OF MASS;  Surgeon: Velora Heckler, MD;  Location: [facility];  Service: General;  Laterality: Right;  Excise subcutaneous mass right axilla Minor Room   MAZE N/A 08/18/2022   Procedure: MAZE USING ATRICURE Oralia Manis;  Surgeon: Lyn Hollingshead, MD;  Location: [facility] OR;  Service: Open Heart Surgery;  Laterality: N/A;    POLYPECTOMY     PROSTATECTOMY  04/16/2005   TEE WITHOUT CARDIOVERSION N/A 08/18/2022   Procedure: TRANSESOPHAGEAL ECHOCARDIOGRAM;  Surgeon: Lyn Hollingshead, MD;  Location: [facility] OR;  Service: Open Heart Surgery;  Laterality: N/A;   TONSILLECTOMY     TOTAL KNEE ARTHROPLASTY Left 07/03/2013   Procedure: LEFT TOTAL KNEE ARTHROPLASTY;  Surgeon: Shelda Pal, MD;  Location: [facility] ORS;  Service: Orthopedics;  Laterality: Left;     Medications Prior to Admission: Prior to Admission medications   Medication Sig Start Date End Date Taking? Authorizing Provider  aspirin EC 81 MG tablet Take 1 tablet (81 mg total) by mouth daily. 08/26/22   Barrett, Erin R, PA-C  beta carotene 16109 UNIT capsule Take 25,000 Units by mouth daily.    [provider]  clonazePAM (KLONOPIN) 0.5 MG tablet TAKE ONE TABLET BY MOUTH AT BEDTIME AS NEEDED 09/23/22   Pincus Sanes, MD  clopidogrel (PLAVIX) 75 MG tablet Take 1 tablet (75 mg total) by mouth daily. 08/26/22 08/26/23  Barrett, Erin R, PA-C  fluticasone (FLONASE) 50 MCG/ACT nasal spray SPRAY ONE SPRAY IN EACH NOSTRIL ONCE DAILY Patient taking differently: Place 1 spray into both nostrils daily. 08/09/22   Pincus Sanes, MD  furosemide (LASIX) 20 MG tablet Take 1 tablet (20 mg total) by mouth daily. X 3 days, then use daily as needed for weight gain of 3-5 lbs in 24 hr time period Patient not taking: Reported on 09/21/2022 08/26/22   Barrett, Denny Peon R, PA-C  glycopyrrolate (ROBINUL) 2 MG tablet Take 1 tablet (2 mg total) by mouth every morning. 08/11/22   Esterwood, Amy S, PA-C  HYDROcodone-acetaminophen (NORCO) 5-325 MG tablet Take 1 tablet by mouth every 6 (six) hours as needed for moderate pain. Patient not taking: Reported on 09/30/2022 09/26/22   Small, Brooke L, PA  levothyroxine (SYNTHROID) 50 MCG tablet Take 1 tablet by mouth daily 02/03/22   Pincus Sanes, MD  midodrine (PROAMATINE) 10 MG tablet Take 1 tablet (10 mg total) by mouth 3 (three) times daily with meals. X 7  days, then decrease to 1 tab BID x 7 days, then decrease to 10 mg daily x 7 days then stop 08/26/22   Barrett, Erin R, PA-C  potassium chloride (KLOR-CON M) 10 MEQ tablet Take 1 tablet (10 mEq total) by mouth daily. X 3 days, then daily as needed on days you take Lasix Patient not taking: Reported on 09/21/2022 08/26/22   Barrett, Erin R, PA-C  pyridOXINE (VITAMIN B-6) 100 MG tablet Take 100 mg by mouth daily.    [provider]  rosuvastatin (CRESTOR) 40 MG tablet TAKE 1 TABLET BY MOUTH DAILY 09/10/22   Pincus Sanes, MD  traMADol (ULTRAM) 50 MG tablet Take 1 tablet (50 mg total) by mouth every 6 (six) hours as needed for moderate pain.  Patient not taking: Reported on 09/14/2022 08/26/22   Barrett, Rae Roam, PA-C  vitamin C (ASCORBIC ACID) 500 MG tablet Take 500 mg by mouth 2 (two) times daily.    [provider]  vitamin E 400 UNIT capsule Take 400 Units by mouth daily.    [provider]     Allergies:    Allergies  Allergen Reactions   Atorvastatin Other (See Comments)    Increased LFTs    Social History:   Social History   Socioeconomic History   Marital status: Married    Spouse name: Not on file   Number of children: 2   Years of education: 16   Highest education level: Bachelor's degree (e.g., BA, AB, BS)  Occupational History   Occupation: insur exe    Employer: [employer]  Tobacco Use   Smoking status: Former   Smokeless tobacco: Never   Tobacco comments:    stopped smoking cigarettes 1983, 1 cigar /day 2001-2013  Vaping Use   Vaping Use: Never used  Substance and Sexual Activity   Alcohol use: Yes    Alcohol/week: 7.0 - 14.0 standard drinks of alcohol    Types: 7 - 14 Standard drinks or equivalent per week    Comment: scotch or wine daily   Drug use: No   Sexual activity: Yes  Other Topics Concern   Not on file  Social History Narrative   Exercises daily            Social Determinants of Health   Financial Resource Strain:  Low Risk  (03/18/2022)   Overall Financial Resource Strain (CARDIA)    Difficulty of Paying Living Expenses: Not hard at all  Food Insecurity: No Food Insecurity (09/14/2022)   Hunger Vital Sign    Worried About Running Out of Food in the Last Year: Never true    Ran Out of Food in the Last Year: Never true  Transportation Needs: No Transportation Needs (09/14/2022)   PRAPARE - Administrator, Civil Service (Medical): No    Lack of Transportation (Non-Medical): No  Physical Activity: Sufficiently Active (03/18/2022)   Exercise Vital Sign    Days of Exercise per Week: 5 days    Minutes of Exercise per Session: 30 min  Stress: No Stress Concern Present (03/18/2022)   [employer] of Occupational Health - Occupational Stress Questionnaire    Feeling of Stress : Not at all  Social Connections: Socially Integrated (03/18/2022)   Social Connection and Isolation Panel [NHANES]    Frequency of Communication with Friends and Family: More than three times a week    Frequency of Social Gatherings with Friends and Family: Three times a week    Attends Religious Services: 1 to 4 times per year    Active Member of Clubs or Organizations: Yes    Attends Banker Meetings: 1 to 4 times per year    Marital Status: Married  Catering manager Violence: Not At Risk (08/15/2022)   Humiliation, Afraid, Rape, and Kick questionnaire    Fear of Current or Ex-Partner: No    Emotionally Abused: No    Physically Abused: No    Sexually Abused: No    Family History:   The patient's family history includes Alcohol abuse in his paternal uncle; COPD in his mother; Cancer in his father; Colon cancer (age of onset: 81) in his father; Hypertension in his paternal uncle; Lung cancer in his mother and sister; Prostate cancer in his father;  Stroke (age of onset: 72) in his paternal uncle; Urolithiasis in his father. There is no history of Asthma, Heart disease, Esophageal cancer, Stomach cancer,  Liver disease, Colon polyps, or Rectal cancer.    ROS:  Please see the history of present illness.  All other ROS reviewed and negative.     Physical Exam/Data:   Vitals:   10/01/22 2220 10/01/22 2225 10/01/22 2230 10/01/22 2235  BP: 95/72 93/68 97/82  107/68  Pulse: (!) 53 66 75 97  Resp: 13 20 16  (!) 24  Temp:      SpO2: 99% 100% 99% 99%  Weight:      Height:       No intake or output data in the 24 hours ending 10/01/22 2248    10/01/2022    9:14 PM 09/30/2022    3:09 PM 09/21/2022    9:06 AM  Last 3 Weights  Weight (lbs) 134 lb 14.7 oz 135 lb 140 lb 14 oz  Weight (kg) 61.2 kg 61.236 kg 63.9 kg     Body mass index is 21.13 kg/m.  General:  Well nourished, well developed, in no acute distress HEENT: normal Neck: no JVD Vascular: No carotid bruits; Distal pulses 2+ bilaterally   Cardiac:  normal S1, S2; irregularly irregular no murmur  Lungs:  clear to auscultation bilaterally, no wheezing, rhonchi or rales  Abd: soft, nontender, no hepatomegaly  Ext: no edema Musculoskeletal:  No deformities, BUE and BLE strength normal and equal Skin: warm and dry  Neuro:  CNs 2-12 intact, no focal abnormalities noted Psych:  Normal affect    EKG: A flutter RVR  Relevant CV Studies: Expand All Collapse All       Cardiology Clinic Note    Patient Name: Jeremy Waters Date of Encounter: 09/07/2022   Primary Care Provider:  Pincus Sanes, MD Primary Cardiologist:  None   Patient Profile    Manpreet Mizzell Leer 77 year old male presents to the clinic today for follow-up evaluation of his coronary artery disease status post CABG 08/18/2022 and AVR.   Past Medical History        Past Medical History:  Diagnosis Date   Adenomatous colon polyp 09/2007   Arthritis      OA / PAIN LEFT KNEE   Cancer      prostate cancer   Cataract      removed both eyes   COVID-19 virus infection     Diverticulosis of colon      w/o hemorrage   Heart murmur      TOLD HE HAS A SLIGHT  MURMUR   Hyperlipidemia     Inguinal hernia      left side    Thyroid disease           Past Surgical History:  Procedure Laterality Date   AORTIC VALVE REPLACEMENT N/A 08/18/2022    Procedure: AORTIC VALVE REPLACEMENT (AVR) USING EDWARDS INSPIRIS AORTIC VALVE SIZE ;  Surgeon: Lyn Hollingshead, MD;  Location: [facility] OR;  Service: Open Heart Surgery;  Laterality: N/A;   BACK SURGERY   05/12/2005    L5   CLIPPING OF ATRIAL APPENDAGE N/A 08/18/2022    Procedure: CLIPPING OF ATRIAL APPENDAGE USING ATRICURE 45 ATRICLIP;  Surgeon: Lyn Hollingshead, MD;  Location: [facility] OR;  Service: Open Heart Surgery;  Laterality: N/A;  Median Sternotomy   COLONOSCOPY   1999    Negative; Dr Russella Dar   COLONOSCOPY   2004    tics, hemorrhoids  COLONOSCOPY   2009    polyps (T.A.)   CORONARY ARTERY BYPASS GRAFT N/A 08/18/2022    Procedure: CORONARY ARTERY BYPASS GRAFTING (CABG) X2 USING LEFT INTERNAL MAMMARY ARTERY AND LEFT ENDOSCOPICALLY HARVESTED GREATER SAPHENOUS VEIN.;  Surgeon: Lyn Hollingshead, MD;  Location: [facility] OR;  Service: Open Heart Surgery;  Laterality: N/A;   HERNIA REPAIR   1987   HERNIA REPAIR   10/2003   KNEE SURGERY   1969    Patellar fracture fragment Lt   LAMINECTOMY   2000    L4-5   LEFT HEART CATH AND CORONARY ANGIOGRAPHY N/A 08/16/2022    Procedure: LEFT HEART CATH AND CORONARY ANGIOGRAPHY;  Surgeon: Orbie Pyo, MD;  Location: [facility] INVASIVE CV LAB;  Service: Cardiovascular;  Laterality: N/A;   LUMBAR LAMINECTOMY/DECOMPRESSION MICRODISCECTOMY Right 10/08/2015    Procedure: MICRO LUMBAR DECOMPRESSION L4-L5 AND L5-S1 ON RIGHT ;  Surgeon: Jene Every, MD;  Location: [facility] ORS;  Service: Orthopedics;  Laterality: Right;   MASS EXCISION   04/26/2011    Procedure: MINOR EXCISION OF MASS;  Surgeon: Velora Heckler, MD;  Location: [facility];  Service: General;  Laterality: Right;  Excise subcutaneous mass right axilla Minor Room   MAZE N/A 08/18/2022    Procedure: MAZE USING ATRICURE  Oralia Manis;  Surgeon: Lyn Hollingshead, MD;  Location: [facility] OR;  Service: Open Heart Surgery;  Laterality: N/A;   POLYPECTOMY       PROSTATECTOMY   04/16/2005   TEE WITHOUT CARDIOVERSION N/A 08/18/2022    Procedure: TRANSESOPHAGEAL ECHOCARDIOGRAM;  Surgeon: Lyn Hollingshead, MD;  Location: [facility] OR;  Service: Open Heart Surgery;  Laterality: N/A;   TONSILLECTOMY       TOTAL KNEE ARTHROPLASTY Left 07/03/2013    Procedure: LEFT TOTAL KNEE ARTHROPLASTY;  Surgeon: Shelda Pal, MD;  Location: [facility] ORS;  Service: Orthopedics;  Laterality: Left;      Allergies        Allergies  Allergen Reactions   Atorvastatin Other (See Comments)      Increased LFTs      History of Present Illness    Khari Spence Arentz is a PMH of hypertension, coronary artery disease, NSTEMI, atrial fibrillation, cervical radiculopathy, GERD, cardiac murmur, PVCs, left upper quadrant pain, hypothyroidism, and is status post CABG x 2.   He presented to the emergency department on 08/15/2022 and was discharged on 08/26/2022.  He noted several hours of intermittent atrial fibrillation.  He was concerned and presented to the emergency department for further evaluation.  He also noted an intermittent left shoulder and arm pain which was nonexertional.  It was felt his symptoms were musculoskeletal.  He is fairly physically active and uses his elliptical frequently.  Using his elliptical does not exacerbate his symptoms.  He was noted to be in atrial fibrillation with RVR with a rate of 100-130 in the emergency department.  His BNP was elevated at 352.  His high-sensitivity troponin was over 2000 and he was ruled in for NSTEMI.  He underwent cardiac catheterization on 08/16/2022 and was noted to have multivessel coronary disease.  Cardiothoracic surgery was consulted and he underwent CABG x 2 (LIMA-LAD, SVG-RCA), aortic valve replacement.  He also underwent Maze procedure and left atrial appendage clipping.  He was extubated on postop day 1.  He  was started on gentle diuresis for mild fluid volume overload.  He developed atrial fibrillation with RVR.  He was placed on amiodarone and he briefly converted back to  sinus rhythm but went right back into atrial fibrillation.  He responded well to IV diuresis.  On postoperative day 3 he again converted to sinus rhythm and then back to atrial fibrillation.  Due to bradycardia he remained attached to pacing wires.  He remained at his baseline bradycardia and tolerated this without difficulty.  His pacing wires were removed without difficulty.  His surgical incisions continue to heal well without signs of infection.  He was discharged on 08/26/2022.   He presents to the clinic today for follow-up evaluation and states he has had some right shoulder pain.  He saw his PCP and was prescribed prednisone.  He continues to heal well post CABG.  We reviewed his hospitalization and surgeries.  He and his wife expressed understanding.  He has been walking 10 to 15 minutes 3 times per day.  He has upcoming appointment with cardiothoracic surgery.  We reviewed his current medications.  He has had 2 episodes of accelerated heartbeat that were brief.  His EKG today shows normal sinus rhythm incomplete right bundle branch block 60 bpm.  I will continue his current medication regimen, give salty 6 diet sheet, high-fiber diet instructions, have him maintain sternal precautions and slowly increase his physical activity.  We will repeat his fasting lipids and LFTs in 8 weeks and plan follow-up in 3 to 4 months.   Today he denies chest pain, shortness of breath, lower extremity edema, fatigue, palpitations, melena, hematuria, hemoptysis, diaphoresis, weakness, presyncope, syncope, orthopnea, and PND.       Home Medications           Prior to Admission medications   Medication Sig Start Date End Date Taking? Authorizing Provider  aspirin EC 81 MG tablet Take 1 tablet (81 mg total) by mouth daily. 08/26/22     Barrett, Erin R,  PA-C  beta carotene 16109 UNIT capsule Take 25,000 Units by mouth daily.       [provider]  clonazePAM (KLONOPIN) 0.5 MG tablet TAKE ONE TABLET BY MOUTH AT BEDTIME AS NEEDED Patient taking differently: Take 0.5 mg by mouth at bedtime as needed (Sleep). 09/07/21     Pincus Sanes, MD  clopidogrel (PLAVIX) 75 MG tablet Take 1 tablet (75 mg total) by mouth daily. 08/26/22 08/26/23   Barrett, Erin R, PA-C  fluticasone (FLONASE) 50 MCG/ACT nasal spray SPRAY ONE SPRAY IN EACH NOSTRIL ONCE DAILY Patient taking differently: Place 1 spray into both nostrils daily. 08/09/22     Pincus Sanes, MD  furosemide (LASIX) 20 MG tablet Take 1 tablet (20 mg total) by mouth daily. X 3 days, then use daily as needed for weight gain of 3-5 lbs in 24 hr time period 08/26/22     Barrett, Erin R, PA-C  glycopyrrolate (ROBINUL) 2 MG tablet Take 1 tablet (2 mg total) by mouth every morning. 08/11/22     Esterwood, Amy S, PA-C  levothyroxine (SYNTHROID) 50 MCG tablet Take 1 tablet by mouth daily 02/03/22     Pincus Sanes, MD  midodrine (PROAMATINE) 10 MG tablet Take 1 tablet (10 mg total) by mouth 3 (three) times daily with meals. X 7 days, then decrease to 1 tab BID x 7 days, then decrease to 10 mg daily x 7 days then stop 08/26/22     Barrett, Erin R, PA-C  potassium chloride (KLOR-CON M) 10 MEQ tablet Take 1 tablet (10 mEq total) by mouth daily. X 3 days, then daily as needed on days you take  Lasix 08/26/22     Barrett, Erin R, PA-C  pyridOXINE (VITAMIN B-6) 100 MG tablet Take 100 mg by mouth daily.       [provider]  rosuvastatin (CRESTOR) 40 MG tablet Take 1 tablet (40 mg total) by mouth daily. 03/18/22     Pincus Sanes, MD  traMADol (ULTRAM) 50 MG tablet Take 1 tablet (50 mg total) by mouth every 6 (six) hours as needed for moderate pain. 08/26/22     Barrett, Erin R, PA-C  vitamin C (ASCORBIC ACID) 500 MG tablet Take 500 mg by mouth 2 (two) times daily.       [provider]  vitamin E 400 UNIT  capsule Take 400 Units by mouth daily.       [provider]      Family History         Family History  Problem Relation Age of Onset   Lung cancer Mother     COPD Mother     Cancer Father          Bladder Cancer   Prostate cancer Father     Colon cancer Father 44   Urolithiasis Father     Lung cancer Sister     Alcohol abuse Paternal Uncle     Stroke Paternal Uncle 63   Hypertension Paternal Uncle     Asthma Neg Hx     Heart disease Neg Hx     Esophageal cancer Neg Hx     Stomach cancer Neg Hx     Liver disease Neg Hx     Colon polyps Neg Hx     Rectal cancer Neg Hx      He indicated that his mother is deceased. He indicated that his father is deceased. He indicated that one of his two sisters is deceased. He indicated that the status of his neg hx is unknown.   Social History    Social History         Socioeconomic History   Marital status: Married      Spouse name: Not on file   Number of children: 2   Years of education: Not on file   Highest education level: Not on file  Occupational History   Occupation: insur exe      Employer: [employer]  Tobacco Use   Smoking status: Former   Smokeless tobacco: Never   Tobacco comments:      stopped smoking cigarettes 1983, 1 cigar /day 2001-2013  Vaping Use   Vaping Use: Never used  Substance and Sexual Activity   Alcohol use: Yes      Alcohol/week: 7.0 - 14.0 standard drinks of alcohol      Types: 7 - 14 Standard drinks or equivalent per week      Comment: scotch or wine daily   Drug use: No   Sexual activity: Yes  Other Topics Concern   Not on file  Social History Narrative    Exercises daily                   Social Determinants of Health        Financial Resource Strain: Low Risk  (03/18/2022)    Overall Financial Resource Strain (CARDIA)     Difficulty of Paying Living Expenses: Not hard at all  Food Insecurity: No Food Insecurity (08/15/2022)    Hunger Vital Sign     Worried  About Running Out of Food in the Last Year:  Never true     Ran Out of Food in the Last Year: Never true  Transportation Needs: No Transportation Needs (08/30/2022)    PRAPARE - Therapist, art (Medical): No     Lack of Transportation (Non-Medical): No  Physical Activity: Sufficiently Active (03/18/2022)    Exercise Vital Sign     Days of Exercise per Week: 5 days     Minutes of Exercise per Session: 30 min  Stress: No Stress Concern Present (03/18/2022)    [employer] of Occupational Health - Occupational Stress Questionnaire     Feeling of Stress : Not at all  Social Connections: Socially Integrated (03/18/2022)    Social Connection and Isolation Panel [NHANES]     Frequency of Communication with Friends and Family: More than three times a week     Frequency of Social Gatherings with Friends and Family: Three times a week     Attends Religious Services: 1 to 4 times per year     Active Member of Clubs or Organizations: Yes     Attends Banker Meetings: 1 to 4 times per year     Marital Status: Married  Catering manager Violence: Not At Risk (08/15/2022)    Humiliation, Afraid, Rape, and Kick questionnaire     Fear of Current or Ex-Partner: No     Emotionally Abused: No     Physically Abused: No     Sexually Abused: No      Review of Systems    General:  No chills, fever, night sweats or weight changes.  Cardiovascular:  No chest pain, dyspnea on exertion, edema, orthopnea, palpitations, paroxysmal nocturnal dyspnea. Dermatological: No rash, lesions/masses Respiratory: No cough, dyspnea Urologic: No hematuria, dysuria Abdominal:   No nausea, vomiting, diarrhea, bright red blood per rectum, melena, or hematemesis Neurologic:  No visual changes, wkns, changes in mental status. All other systems reviewed and are otherwise negative except as noted above.   Physical Exam    VS:  BP 124/72   Pulse 60   Ht 5\' 7"  (1.702 m)   Wt 146 lb  12.8 oz (66.6 kg)   SpO2 98%   BMI 22.99 kg/m  , BMI Body mass index is 22.99 kg/m. GEN: Well nourished, well developed, in no acute distress. HEENT: normal. Neck: Supple, no JVD, carotid bruits, or masses. Cardiac: RRR, no murmurs, rubs, or gallops. No clubbing, cyanosis, edema.  Radials/DP/PT 2+ and equal bilaterally.  Respiratory:  Respirations regular and unlabored, clear to auscultation bilaterally. GI: Soft, nontender, nondistended, BS + x 4. MS: no deformity or atrophy. Skin: warm and dry, no rash. Neuro:  Strength and sensation are intact. Psych: Normal affect.   Accessory Clinical Findings    Recent Labs: 08/15/2022: ALT 24; B Natriuretic Peptide 352.2; TSH 1.348 08/25/2022: Hemoglobin 9.6; Magnesium 2.1; Platelets 272 08/26/2022: BUN 13; Creatinine, Ser 1.36; Potassium 3.9; Sodium 137    Recent Lipid Panel Labs (Brief)          Component Value Date/Time    CHOL 159 08/17/2022 0605    CHOL 224 (H) 09/02/2014 0919    TRIG 63 08/17/2022 0605    TRIG 116 09/02/2014 0919    HDL 61 08/17/2022 0605    HDL 76 09/02/2014 0919    CHOLHDL 2.6 08/17/2022 0605    VLDL 13 08/17/2022 0605    LDLCALC 85 08/17/2022 0605    LDLCALC 125 (H) 09/02/2014 0919    LDLDIRECT 179.6 01/04/2013 0740  ECG personally reviewed by me today-normal sinus rhythm incomplete right bundle branch block 60 bpm- No acute changes   Echocardiogram 08/23/2022   IMPRESSIONS     1. Basal inferior,basala lateral hypokinesis.. Left ventricular ejection  fraction, by estimation, is 55 to 60%. The left ventricle has normal  function. Left ventricular diastolic parameters were normal.   2. Right ventricular systolic function is normal. The right ventricular  size is normal.   3. Left atrial size was mildly dilated.   4. Mild mitral valve regurgitation.   5. S/P AVR (23 mm INSPIRIS valve, procedure date 08/18/22). Peak and mean  gradients through the valve are 19 and 10 mm HG respectively. .  The aortic  valve has been repaired/replaced. Aortic valve regurgitation is not  visualized.   6. The inferior vena cava is normal in size with <50% respiratory  variability, suggesting right atrial pressure of 8 mmHg.   FINDINGS   Left Ventricle: Basal inferior,basala lateral hypokinesis. Left  ventricular ejection fraction, by estimation, is 55 to 60%. The left  ventricle has normal function. The left ventricular internal cavity size  was normal in size. There is no left  ventricular hypertrophy. Left ventricular diastolic parameters were  normal.   Right Ventricle: The right ventricular size is normal. Right vetricular  wall thickness was not assessed. Right ventricular systolic function is  normal.   Left Atrium: Left atrial size was mildly dilated.   Right Atrium: Right atrial size was normal in size.   Pericardium: Trivial pericardial effusion is present.   Mitral Valve: There is mild thickening of the mitral valve leaflet(s).  Mild mitral valve regurgitation.   Tricuspid Valve: The tricuspid valve is normal in structure. Tricuspid  valve regurgitation is trivial.   Aortic Valve: S/P AVR (23 mm INSPIRIS valve, procedure date 08/18/22). Peak  and mean gradients through the valve are 19 and 10 mm HG respectively. The  aortic valve has been repaired/replaced. Aortic valve regurgitation is not  visualized. Aortic valve mean   gradient measures 9.5 mmHg. Aortic valve peak gradient measures 18.6  mmHg. Aortic valve area, by VTI measures 1.62 cm.   Pulmonic Valve: The pulmonic valve was thickened with good excursion.  Pulmonic valve regurgitation is trivial.   Aorta: The aortic root is normal in size and structure.   Venous: The inferior vena cava is normal in size with less than 50%  respiratory variability, suggesting right atrial pressure of 8 mmHg.   IAS/Shunts: No atrial level shunt detected by color flow Doppler.         Laboratory Data:  High Sensitivity  Troponin:   Recent Labs  Lab 10/01/22 2126  TROPONINIHS 6      Chemistry Recent Labs  Lab 10/01/22 2126  NA 134*  K 3.7  CL 103  CO2 19*  GLUCOSE 241*  BUN 28*  CREATININE 1.22  CALCIUM 9.9  MG 2.5*  GFRNONAA >60  ANIONGAP 12    No results for input(s): "PROT", "ALBUMIN", "AST", "ALT", "ALKPHOS", "BILITOT" in the last 168 hours. Lipids No results for input(s): "CHOL", "TRIG", "HDL", "LABVLDL", "LDLCALC", "CHOLHDL" in the last 168 hours. Hematology Recent Labs  Lab 10/01/22 2126  WBC 8.4  RBC 4.32  HGB 13.5  HCT 39.8  MCV 92.1  MCH 31.3  MCHC 33.9  RDW 14.0  PLT 405*   Thyroid  Recent Labs  Lab 10/01/22 2126  TSH 1.787   BNPNo results for input(s): "BNP", "PROBNP" in the last 168 hours.  DDimer  No results for input(s): "DDIMER" in the last 168 hours.   Radiology/Studies:  DG Chest Portable 1 View  Result Date: 10/01/2022 CLINICAL DATA:  Shortness of breath and chest pain EXAM: PORTABLE CHEST 1 VIEW COMPARISON:  09/09/2022 FINDINGS: Post sternotomy changes with valve prosthesis and atrial appendage clip. No acute airspace disease or effusion. Normal cardiomediastinal silhouette. No pneumothorax IMPRESSION: No active disease. Electronically Signed   By: Jasmine Pang M.D.   On: 10/01/2022 22:00     Assessment and Plan:   Paroxysmal a flutter, A-fib with RVR S/p CABGx 2 (LIMA - LAD, SVG - RCA) 08/18/22 Aortic Valve Replacement (23mm INSPIRIS, SN [id number]) MAZE (Pulmonary vein isolation, bilateral) Left atrial appendage clip (45mm Atriclip  Plan: -Admit to cardiology, wean diltiazem, begin Toprol-XL 25 mg twice daily. -Begin heparin gtt., -Consider TEE cardioversion on Monday given a flutter with RVR and symptomatic.  He has a atrial clip-if no pouch or leak then he could be just anticoagulated for 4 weeks post cardioversion and stop. -Continue home medications   Risk Assessment/Risk Scores:         CHA2DS2-VASc Score =    {Click here to calculate  score.  REFRESH note before signing. :1} This indicates a  % annual risk of stroke. The patient's score is based upon:        Severity of Illness: The appropriate patient status for this patient is INPATIENT. Inpatient status is judged to be reasonable and necessary in order to provide the required intensity of service to ensure the patient's safety. The patient's presenting symptoms, physical exam findings, and initial radiographic and laboratory data in the context of their chronic comorbidities is felt to place them at high risk for further clinical deterioration. Furthermore, it is not anticipated that the patient will be medically stable for discharge from the hospital within 2 midnights of admission.   * I certify that at the point of admission it is my clinical judgment that the patient will require inpatient hospital care spanning beyond 2 midnights from the point of admission due to high intensity of service, high risk for further deterioration and high frequency of surveillance required.*   For questions or updates, please contact [facility] Please consult [email or web address] for contact info under     Signed, Elmon Kirschner, MD  10/01/2022 10:48 PM

## 2022-10-01 NOTE — Progress Notes (Signed)
ANTICOAGULATION CONSULT NOTE - Initial Consult  Pharmacy Consult for heparin Indication: atrial fibrillation  Allergies  Allergen Reactions   Atorvastatin Other (See Comments)    Increased LFTs    Patient Measurements: Height: 5\' 7"  (170.2 cm) Weight: 61.2 kg (134 lb 14.7 oz) IBW/kg (Calculated) : 66.1 Heparin Dosing Weight: TBW  Vital Signs: Temp: 97.8 F (36.6 C) (05/10 2109) BP: 102/80 (05/10 2130) Pulse Rate: 135 (05/10 2145)  Labs: Recent Labs    10/01/22 2126  HGB 13.5  HCT 39.8  PLT 405*    CrCl cannot be calculated (Patient's most recent lab result is older than the maximum 21 days allowed.).   Medical History: Past Medical History:  Diagnosis Date   Adenomatous colon polyp 09/2007   Arthritis    OA / PAIN LEFT KNEE   Cancer (HCC)    prostate cancer   Cataract    removed both eyes   Coronary artery disease    COVID-19 virus infection    Diverticulosis of colon    w/o hemorrage   Heart murmur    TOLD HE HAS A SLIGHT MURMUR   Hyperlipidemia    Inguinal hernia    left side    Thyroid disease     Assessment: 76 YOM presenting with palpitations and fatigue, in Afib, he is not on anticoagulation PTA, CBC wnl  Goal of Therapy:  Heparin level 0.3-0.7 units/ml Monitor platelets by anticoagulation protocol: Yes   Plan:  Heparin 3000 units IV x 1, and gtt at 900 units/hr F/u 8 hour heparin level with AM labs F/u long term [facility] plan  Daylene Posey, PharmD, [facility] Clinical Pharmacist ED Pharmacist Phone # [phone] 10/01/2022 10:02 PM

## 2022-10-01 NOTE — ED Notes (Signed)
Patient up to side of bed to use urinal. Visitor at bedside assisting patient.

## 2022-10-01 NOTE — ED Triage Notes (Addendum)
Patient arrives with complaints of palpitations and fatigue x1 day. Patient states that his watch alerted him earlier today of a elevated heart rate in the 130's. Patient also recently had a heart valve replaced as well.   Reports no pain, only fatigue.

## 2022-10-01 NOTE — ED Notes (Signed)
Dr. Doran Durand to bedside assessing patient.

## 2022-10-01 NOTE — ED Provider Notes (Signed)
[place] EMERGENCY DEPARTMENT AT [facility] Provider Note   CSN: [id number] Arrival date & time: 10/01/22  2104     History Chief Complaint  Patient presents with   Palpitations   Fatigue    HPI Jeremy Waters is a 77 y.o. male presenting for palpitations near syncopal event. States that he was getting ready to get in bed when he started having palpitations and felt like he was in a pass out. His watch alerted him to a tachycardia in the 130s.  TEE 03/24 PRE-OP FINDINGS   Left Ventricle: The left ventricle has mildly reduced systolic function,  with an ejection fraction of 45-50%. The cavity size was normal. There is  no left ventricular hypertrophy.   Cath Report 07/2022 Ost LAD to Prox LAD lesion is 90% stenosed.   Dist RCA lesion is 95% stenosed.   Mid RCA lesion is 80% stenosed.   Dist Cx lesion is 90% stenosed.   Mid LAD lesion is 65% stenosed.   Patient's recorded medical, surgical, social, medication list and allergies were reviewed in the Snapshot window as part of the initial history.   Review of Systems   Review of Systems  Constitutional:  Negative for chills and fever.  HENT:  Negative for ear pain and sore throat.   Eyes:  Negative for pain and visual disturbance.  Respiratory:  Negative for cough and shortness of breath.   Cardiovascular:  Negative for chest pain and palpitations.  Gastrointestinal:  Negative for abdominal pain and vomiting.  Genitourinary:  Negative for dysuria and hematuria.  Musculoskeletal:  Negative for arthralgias and back pain.  Skin:  Negative for color change and rash.  Neurological:  Positive for speech difficulty. Negative for seizures and syncope.  All other systems reviewed and are negative.   Physical Exam Updated Vital Signs BP 107/68   Pulse 97   Temp 97.8 F (36.6 C)   Resp (!) 24   Ht 5\' 7"  (1.702 m)   Wt 61.2 kg   SpO2 99%   BMI 21.13 kg/m  Physical Exam Vitals and nursing note reviewed.   Constitutional:      General: He is not in acute distress.    Appearance: He is well-developed.  HENT:     Head: Normocephalic and atraumatic.  Eyes:     Conjunctiva/sclera: Conjunctivae normal.  Cardiovascular:     Rate and Rhythm: Normal rate and regular rhythm.     Heart sounds: No murmur heard. Pulmonary:     Effort: Pulmonary effort is normal. No respiratory distress.     Breath sounds: Normal breath sounds.  Abdominal:     Palpations: Abdomen is soft.     Tenderness: There is no abdominal tenderness.  Musculoskeletal:        General: No swelling.     Cervical back: Neck supple.  Skin:    General: Skin is warm and dry.     Capillary Refill: Capillary refill takes less than 2 seconds.  Neurological:     Mental Status: He is alert.  Psychiatric:        Mood and Affect: Mood normal.      ED Course/ Medical Decision Making/ A&P    Procedures .Critical Care  Performed by: Glyn Ade, MD Authorized by: Glyn Ade, MD   Critical care provider statement:    Critical care time (minutes):  30   Critical care was necessary to treat or prevent imminent or life-threatening deterioration of the following conditions:  Circulatory failure  and cardiac failure   Critical care was time spent personally by me on the following activities:  Development of treatment plan with patient or surrogate, discussions with consultants, evaluation of patient's response to treatment, examination of patient, ordering and review of laboratory studies, ordering and review of radiographic studies, ordering and performing treatments and interventions, pulse oximetry, re-evaluation of patient's condition and review of old charts   Care discussed with: admitting provider      Medications Ordered in ED Medications  heparin ADULT infusion 100 units/mL (25000 units/267mL) (900 Units/hr Intravenous New Bag/Given 10/01/22 2219)  diltiazem (CARDIZEM) injection 10 mg (10 mg Intravenous Given  10/01/22 2159)  heparin bolus via infusion 3,000 Units (3,000 Units Intravenous Bolus from Bag 10/01/22 2219)    Medical Decision Making:    Jeremy Waters is a 77 y.o. male who presented to the ED today with palpitations detailed above.    I was emergently called to bedside because of his presentation.  He is tachycardic to the 140s and lightheaded. EKG in triage demonstrates a flutter with a 2-1 conduction and resulting tachycardia to the 130s. Borderline blood pressures. Denies fevers chills, nausea vomiting, syncope shortness of breath.  Otherwise ambulatory tolerating p.o. intake prior to this event. Discussed with cardiology on-call who agreed with diltiazem bolus and infusion, IV fluids, arrange for transfer to [facility]. After initiation of diltiazem, patient converted to 3-1 block but does appear to still be in a flutter.  He is not anticoagulated with full anticoagulation is only on Plavix and aspirin therefore cardioversion is not safe at this time without TEE.  Started on heparin per recommendations from cardiology.  Temporary admission orders placed to cardiac telemetry unit.  Disposition:   Based on the above findings, I believe this patient is stable for admission.    Patient/family educated about specific findings on our evaluation and explained exact reasons for admission.  Patient/family educated about clinical situation and time was allowed to answer questions.   Admission team communicated with and agreed with need for admission. Patient admitted. Patient ready to move at this time.     Emergency Department Medication Summary:   Medications  heparin ADULT infusion 100 units/mL (25000 units/236mL) (900 Units/hr Intravenous New Bag/Given 10/01/22 2219)  diltiazem (CARDIZEM) injection 10 mg (10 mg Intravenous Given 10/01/22 2159)  heparin bolus via infusion 3,000 Units (3,000 Units Intravenous Bolus from Bag 10/01/22 2219)        Clinical Impression:  1.  Tachycardia      Admit   Final Clinical Impression(s) / ED Diagnoses Final diagnoses:  Tachycardia    Rx / DC Orders ED Discharge Orders     None         Glyn Ade, MD 10/01/22 2328

## 2022-10-02 ENCOUNTER — Other Ambulatory Visit: Payer: Self-pay

## 2022-10-02 DIAGNOSIS — Z8616 Personal history of COVID-19: Secondary | ICD-10-CM | POA: Diagnosis not present

## 2022-10-02 DIAGNOSIS — I4892 Unspecified atrial flutter: Secondary | ICD-10-CM | POA: Diagnosis present

## 2022-10-02 DIAGNOSIS — Z8546 Personal history of malignant neoplasm of prostate: Secondary | ICD-10-CM | POA: Diagnosis not present

## 2022-10-02 DIAGNOSIS — I2510 Atherosclerotic heart disease of native coronary artery without angina pectoris: Secondary | ICD-10-CM | POA: Diagnosis not present

## 2022-10-02 DIAGNOSIS — E039 Hypothyroidism, unspecified: Secondary | ICD-10-CM | POA: Diagnosis not present

## 2022-10-02 DIAGNOSIS — E785 Hyperlipidemia, unspecified: Secondary | ICD-10-CM | POA: Diagnosis not present

## 2022-10-02 DIAGNOSIS — Z952 Presence of prosthetic heart valve: Secondary | ICD-10-CM | POA: Diagnosis not present

## 2022-10-02 DIAGNOSIS — Z951 Presence of aortocoronary bypass graft: Secondary | ICD-10-CM | POA: Diagnosis not present

## 2022-10-02 DIAGNOSIS — Z96652 Presence of left artificial knee joint: Secondary | ICD-10-CM | POA: Diagnosis not present

## 2022-10-02 DIAGNOSIS — Z7952 Long term (current) use of systemic steroids: Secondary | ICD-10-CM | POA: Diagnosis not present

## 2022-10-02 DIAGNOSIS — Z7982 Long term (current) use of aspirin: Secondary | ICD-10-CM | POA: Diagnosis not present

## 2022-10-02 DIAGNOSIS — Z87891 Personal history of nicotine dependence: Secondary | ICD-10-CM | POA: Diagnosis not present

## 2022-10-02 HISTORY — DX: Unspecified atrial flutter: I48.92

## 2022-10-02 LAB — BASIC METABOLIC PANEL
Anion gap: 9 (ref 5–15)
BUN: 18 mg/dL (ref 8–23)
CO2: 21 mmol/L — ABNORMAL LOW (ref 22–32)
Calcium: 8.8 mg/dL — ABNORMAL LOW (ref 8.9–10.3)
Chloride: 108 mmol/L (ref 98–111)
Creatinine, Ser: 0.97 mg/dL (ref 0.61–1.24)
GFR, Estimated: >60 mL/min (ref >60)
Glucose, Bld: 99 mg/dL (ref 70–99)
Potassium: 3.2 mmol/L — ABNORMAL LOW (ref 3.5–5.1)
Sodium: 138 mmol/L (ref 135–145)

## 2022-10-02 LAB — CBC
HCT: 35.4 % — ABNORMAL LOW (ref 39.0–52.0)
Hemoglobin: 11.7 g/dL — ABNORMAL LOW (ref 13.0–17.0)
MCH: 30.3 pg (ref 26.0–34.0)
MCHC: 33.1 g/dL (ref 30.0–36.0)
MCV: 91.7 fL (ref 80.0–100.0)
Platelets: 345 10*3/uL (ref 150–400)
RBC: 3.86 MIL/uL — ABNORMAL LOW (ref 4.22–5.81)
RDW: 13.9 % (ref 11.5–15.5)
WBC: 6.8 10*3/uL (ref 4.0–10.5)
nRBC: 0 % (ref 0.0–0.2)

## 2022-10-02 LAB — TROPONIN I (HIGH SENSITIVITY): Troponin I (High Sensitivity): 6 ng/L (ref <18)

## 2022-10-02 LAB — HEPARIN LEVEL (UNFRACTIONATED): Heparin Unfractionated: 0.26 IU/mL — ABNORMAL LOW (ref 0.30–0.70)

## 2022-10-02 LAB — MRSA NEXT GEN BY PCR, NASAL: MRSA by PCR Next Gen: NOT DETECTED

## 2022-10-02 MED ORDER — ACETAMINOPHEN 325 MG PO TABS: 650.0000 mg | ORAL_TABLET | ORAL | Status: DC | PRN

## 2022-10-02 MED ORDER — CLONAZEPAM 0.5 MG PO TABS: 0.5000 mg | ORAL_TABLET | Freq: Every evening | ORAL | Status: DC | PRN

## 2022-10-02 MED ORDER — METOPROLOL SUCCINATE ER 25 MG PO TB24
12.5000 mg | ORAL_TABLET | Freq: Two times a day (BID) | ORAL | Status: DC
  Filled 2022-10-02: qty 1

## 2022-10-02 MED ORDER — LEVOTHYROXINE SODIUM 50 MCG PO TABS
50.0000 ug | ORAL_TABLET | Freq: Every day | ORAL | Status: DC
  Administered 2022-10-02: 50 ug via ORAL
  Filled 2022-10-02: qty 1

## 2022-10-02 MED ORDER — ASPIRIN 81 MG PO TBEC
81.0000 mg | DELAYED_RELEASE_TABLET | Freq: Every day | ORAL | Status: DC
  Administered 2022-10-02: 81 mg via ORAL
  Filled 2022-10-02 (×2): qty 1

## 2022-10-02 MED ORDER — METOPROLOL SUCCINATE ER 25 MG PO TB24: 25.0000 mg | ORAL_TABLET | Freq: Two times a day (BID) | ORAL | Status: DC

## 2022-10-02 MED ORDER — METOPROLOL SUCCINATE ER 25 MG PO TB24
25.0000 mg | ORAL_TABLET | Freq: Every day | ORAL | Status: DC
  Administered 2022-10-02: 25 mg via ORAL

## 2022-10-02 MED ORDER — VITAMIN C 500 MG PO TABS
500.0000 mg | ORAL_TABLET | Freq: Two times a day (BID) | ORAL | Status: DC
  Administered 2022-10-02: 500 mg via ORAL
  Filled 2022-10-02: qty 1

## 2022-10-02 MED ORDER — METOPROLOL SUCCINATE ER 25 MG PO TB24: 25.0000 mg | ORAL_TABLET | Freq: Every day | ORAL | 3 refills | Status: DC

## 2022-10-02 MED ORDER — VITAMIN B-6 100 MG PO TABS
100.0000 mg | ORAL_TABLET | Freq: Every day | ORAL | Status: DC
  Administered 2022-10-02: 100 mg via ORAL
  Filled 2022-10-02: qty 1

## 2022-10-02 MED ORDER — CLOPIDOGREL BISULFATE 75 MG PO TABS
75.0000 mg | ORAL_TABLET | Freq: Every day | ORAL | Status: DC
  Administered 2022-10-02: 75 mg via ORAL
  Filled 2022-10-02 (×2): qty 1

## 2022-10-02 MED ORDER — HEPARIN BOLUS VIA INFUSION
1000.0000 [IU] | Freq: Once | INTRAVENOUS | Status: AC
  Administered 2022-10-02: 1000 [IU] via INTRAVENOUS
  Filled 2022-10-02: qty 1000

## 2022-10-02 MED ORDER — ROSUVASTATIN CALCIUM 20 MG PO TABS
40.0000 mg | ORAL_TABLET | Freq: Every day | ORAL | Status: DC
  Administered 2022-10-02: 40 mg via ORAL
  Filled 2022-10-02 (×2): qty 2

## 2022-10-02 MED ORDER — ONDANSETRON HCL 4 MG/2ML IJ SOLN: 4.0000 mg | Freq: Four times a day (QID) | INTRAMUSCULAR | Status: DC | PRN

## 2022-10-02 NOTE — Plan of Care (Signed)
Patient ID: Jeremy Waters, male   DOB: September 04, 1945, 77 y.o.   MRN: [id number]    Problem: Education: Goal: Knowledge of General Education information will improve Description: Including pain rating scale, medication(s)/side effects and non-pharmacologic comfort measures Outcome: Adequate for Discharge   Problem: Health Behavior/Discharge Planning: Goal: Ability to manage health-related needs will improve Outcome: Adequate for Discharge   Problem: Clinical Measurements: Goal: Ability to maintain clinical measurements within normal limits will improve Outcome: Adequate for Discharge Goal: Will remain free from infection Outcome: Adequate for Discharge Goal: Diagnostic test results will improve Outcome: Adequate for Discharge Goal: Respiratory complications will improve Outcome: Adequate for Discharge Goal: Cardiovascular complication will be avoided Outcome: Adequate for Discharge   Problem: Activity: Goal: Risk for activity intolerance will decrease Outcome: Adequate for Discharge   Problem: Nutrition: Goal: Adequate nutrition will be maintained Outcome: Adequate for Discharge   Problem: Coping: Goal: Level of anxiety will decrease Outcome: Adequate for Discharge   Problem: Elimination: Goal: Will not experience complications related to bowel motility Outcome: Adequate for Discharge Goal: Will not experience complications related to urinary retention Outcome: Adequate for Discharge   Problem: Pain Managment: Goal: General experience of comfort will improve Outcome: Adequate for Discharge   Problem: Safety: Goal: Ability to remain free from injury will improve Outcome: Adequate for Discharge   Problem: Skin Integrity: Goal: Risk for impaired skin integrity will decrease Outcome: Adequate for Discharge   Problem: Education: Goal: Knowledge of General Education information will improve Description: Including pain rating scale, medication(s)/side effects and  non-pharmacologic comfort measures Outcome: Adequate for Discharge   Problem: Health Behavior/Discharge Planning: Goal: Ability to manage health-related needs will improve Outcome: Adequate for Discharge   Problem: Clinical Measurements: Goal: Ability to maintain clinical measurements within normal limits will improve Outcome: Adequate for Discharge Goal: Will remain free from infection Outcome: Adequate for Discharge Goal: Diagnostic test results will improve Outcome: Adequate for Discharge Goal: Respiratory complications will improve Outcome: Adequate for Discharge Goal: Cardiovascular complication will be avoided Outcome: Adequate for Discharge   Problem: Activity: Goal: Risk for activity intolerance will decrease Outcome: Adequate for Discharge   Problem: Nutrition: Goal: Adequate nutrition will be maintained Outcome: Adequate for Discharge   Problem: Coping: Goal: Level of anxiety will decrease Outcome: Adequate for Discharge   Problem: Elimination: Goal: Will not experience complications related to bowel motility Outcome: Adequate for Discharge Goal: Will not experience complications related to urinary retention Outcome: Adequate for Discharge   Problem: Pain Managment: Goal: General experience of comfort will improve Outcome: Adequate for Discharge   Problem: Safety: Goal: Ability to remain free from injury will improve Outcome: Adequate for Discharge   Problem: Skin Integrity: Goal: Risk for impaired skin integrity will decrease Outcome: Adequate for Discharge   Problem: Education: Goal: Knowledge of disease or condition will improve Outcome: Adequate for Discharge Goal: Understanding of medication regimen will improve Outcome: Adequate for Discharge Goal: Individualized Educational Video(s) Outcome: Adequate for Discharge   Problem: Activity: Goal: Ability to tolerate increased activity will improve Outcome: Adequate for Discharge   Problem:  Cardiac: Goal: Ability to achieve and maintain adequate cardiopulmonary perfusion will improve Outcome: Adequate for Discharge   Problem: Health Behavior/Discharge Planning: Goal: Ability to safely manage health-related needs after discharge will improve Outcome: Adequate for Discharge    Lidia Collum, RN

## 2022-10-02 NOTE — Care Management CC44 (Signed)
Condition Code 44 Documentation Completed  Patient Details  Name: Jeremy Waters MRN: [id number] Date of Birth: December 02, 1945   Condition Code 44 given:   yes Patient signature on Condition Code 44 notice:   yes Documentation of 2 MD's agreement:    Code 44 added to claim:       Gordy Clement, RN 10/02/2022, 12:56 PM

## 2022-10-02 NOTE — Progress Notes (Signed)
Orders received from Dr. Brayton Layman as indicated in epic.

## 2022-10-02 NOTE — Progress Notes (Signed)
Pateint just admitted from [facility] A & O x 4. Denies any acute pain. Notified the cardiologist on call for patient's arrival.

## 2022-10-02 NOTE — Progress Notes (Addendum)
Patient came with a HR in the 130s. He's  NSR/SB and BP right now is 99/70.  HR is 58. I'm going to hold the Metoprolol prescribed  by Dr. Hewitt Blade after the  Pharmacist verifies.  Paged to let him/her know and awaiting for  a call back.. Patient is asymptomatic. Will continue to monitor.

## 2022-10-02 NOTE — Discharge Summary (Signed)
Discharge Summary    Patient ID: Jeremy Waters MRN: [id number]; DOB: 1945/10/23  Admit date: 10/01/2022 Discharge date: 10/02/2022  PCP:  Pincus Sanes, MD   [facility] Providers Cardiologist:  Maisie Fus, MD     Discharge Diagnoses    Principal Problem:   Tachycardia Active Problems:   Hyperlipidemia   Atrial flutter [facility])    Diagnostic Studies/Procedures    Echocardiogram 09/30/22 1. Left ventricular ejection fraction, by estimation, is 50 to 55%. The left ventricle has low normal function. Left ventricular diastolic  parameters were normal.  2. Right ventricular systolic function is mildly reduced. The right  ventricular size is normal. There is normal pulmonary artery systolic pressure.  3. Left atrial size was mildly dilated.  4. The mitral valve is normal in structure. Mild mitral valve regurgitation.  5. S/p AVR (23 mm Edwards Inspiris (March 2024). Difficult to visualize. Peak and mean gradients through the valve are 18 and 10 mm Hg respectively. The aortic valve has been repaired/replaced. Aortic valve regurgitation  is not visualized. There is a 23 mm Edwards Inspiris valve present in the aortic position. Procedure Date:08/18/22.  6. The inferior vena cava is normal in size with greater than 50%  respiratory variability, suggesting right atrial pressure of 3 mmHg.   _____________   History of Present Illness     Jeremy Waters is a 77 y.o. male with a past medical history of hypertension, CAD s/p CABG, atrial fibrillation, cervical radiculopathy, GERD, PVCs, hypothyroidism.  Patient had presented to the emergency department on 08/15/2022 complaining of intermittent atrial fibrillation, intermittent left shoulder and arm pain.  He was noted to be in atrial fibrillation with RVR with a heart rate of 100-130 bpm in the ED.  BNP was elevated to 352.  His high-sensitivity troponin was greater than 2000 and he was ruled in for NSTEMI.  Underwent cardiac  catheterization on 08/16/2022 and was noted to have multivessel coronary artery disease.  CT surgery was consulted, and patient ultimately underwent CABG x 2 with LIMA-LAD, SVG-RCA.  At the time of his bypass surgery, patient also had aortic valve replacement, Maze procedure, and left atrial appendage clipping.  Postoperatively, patient did have atrial fibrillation with RVR.  He was placed on amiodarone and he briefly converted back to sinus rhythm.  However, he quickly converted back to atrial fibrillation.  On postop day 3, he again converted briefly to normal sinus rhythm before going back into A-fib.  He was bradycardic and remained attached to pacing wires.  His pacing wires were later removed he was discharged on 08/26/2022. He was seen by cardiology on 09/07/22, and he was healing well post CABG. He was maintained on ASA, plavix, lasix 20 mg daily PRN, and potassium PRN (when taking lasix), crestor.   Hospital Course     Consultants: None  Patient presented to the ED at [facility] on 5/10 complaining of fatigue and elevated HR.  Patient reported earlier that day, he was sitting on his bed and his watch alerted him that he had a heart rate in the 130s.  In the ED, initial EKG showed atrial flutter with variable AV block, heart rate 132 BPM.  Initial BP was 91/70 and oxygen saturation was 100% on room air.  Labs in the ED showed NA 134, K3.7, creatinine 1.22, WBC 8.4, hemoglobin 13.5, platelets 405.  High-sensitivity troponin 6, 6.  TSH was within normal limits at 1.787.  Chest x-ray showed no active disease.  In the ED, patient was given a diltiazem bolus, and he was transferred to [facility] for further evaluation.  After arriving to [facility] patient converted to sinus bradycardia with heart rate 55 bpm.   Paroxysmal Atrial Flutter  S/p MAZE and left atrial appendage clipping on 08/18/22 - Patient was admitted to the cardiology service at [facility] and converted to sinus  bradycardia after receiving a bolus of diltiazem  - Note, after patient's MAZE and LAA clipping, he was not discharged on [facility]. Continue DAPT with ASA, plavix  - Can consider outpatient TEE to confirm closure  - Dr. Wyline Mood saw patient on 5/11 and deemed him stable for discharge.  - Patient's metoprolol was held on 5/11 due to one low BP reading (89/60). BP was soft when patient was in aflutter with RVR, but has been within normal limits since he converted to sinus. His HR was been in the 50s-60s. I discussed with Dr. Wyline Mood who recommended continuing metoprolol succinate 25 mg daily. Instructed patient not to take metoprolol is his SBP is less than 95 - Patient has a follow up appointment with Dr. Wyline Mood on 10/28/22   CAD s/p CABG - Patient had NSTEMI and ultimately underwent CABG x2 with LIMA-LAD, SVG-RCA on 08/18/22  - Patient chest pain free  - Continue DAPT with ASA, plavix - Continue crestor 40 mg daily  - Continue metoprolol succinate 25 mg daily   S/p AVR  - Completed 08/18/22 - Echocardiogram from 09/30/22 showed mean gradient 9.2 mmHg, peak gradient 17.5 mmHg. No aortic valve regurgitation   HLD - Goal LDL <70  - Lipid panel from 08/17/22 showed LDL 85, HDL 61, triglycerides 63, total cholesterol 159  - Continue crestor 40 mg daily  - Patient will need fasting lipid panel and LFTs at his follow up appointment on 6/6. Instructed him to fast prior to his appointment   Did the patient have an acute coronary syndrome (MI, NSTEMI, STEMI, etc) this admission?:  No                               Did the patient have a percutaneous coronary intervention (stent / angioplasty)?:  No.     _____________  Discharge Vitals Blood pressure 120/76, pulse (!) 55, temperature 98 F (36.7 C), temperature source Oral, resp. rate 13, height 5\' 7"  (1.702 m), weight 61.2 kg, SpO2 98 %.  Filed Weights   10/01/22 2114  Weight: 61.2 kg    Labs & Radiologic Studies    CBC Recent Labs    10/01/22 2126  10/02/22 0537  WBC 8.4 6.8  NEUTROABS 6.7  --   HGB 13.5 11.7*  HCT 39.8 35.4*  MCV 92.1 91.7  PLT 405* 345   Basic Metabolic Panel Recent Labs    62/13/08 2126 10/02/22 0537  NA 134* 138  K 3.7 3.2*  CL 103 108  CO2 19* 21*  GLUCOSE 241* 99  BUN 28* 18  CREATININE 1.22 0.97  CALCIUM 9.9 8.8*  MG 2.5*  --    Liver Function Tests No results for input(s): "AST", "ALT", "ALKPHOS", "BILITOT", "PROT", "ALBUMIN" in the last 72 hours. No results for input(s): "LIPASE", "AMYLASE" in the last 72 hours. High Sensitivity Troponin:   Recent Labs  Lab 10/01/22 2126 10/01/22 2326  TROPONINIHS 6 6    BNP Invalid input(s): "POCBNP" D-Dimer No results for input(s): "DDIMER" in the last 72 hours. Hemoglobin A1C No  results for input(s): "HGBA1C" in the last 72 hours. Fasting Lipid Panel No results for input(s): "CHOL", "HDL", "LDLCALC", "TRIG", "CHOLHDL", "LDLDIRECT" in the last 72 hours. Thyroid Function Tests Recent Labs    10/01/22 2126  TSH 1.787   _____________  DG Chest Portable 1 View  Result Date: 10/01/2022 CLINICAL DATA:  Shortness of breath and chest pain EXAM: PORTABLE CHEST 1 VIEW COMPARISON:  09/09/2022 FINDINGS: Post sternotomy changes with valve prosthesis and atrial appendage clip. No acute airspace disease or effusion. Normal cardiomediastinal silhouette. No pneumothorax IMPRESSION: No active disease. Electronically Signed   By: Jasmine Pang M.D.   On: 10/01/2022 22:00   ECHOCARDIOGRAM COMPLETE  Result Date: 09/30/2022    ECHOCARDIOGRAM REPORT   Patient Name:   Jeremy Waters Date of Exam: 09/30/2022 Medical Rec #:  [id number]       Height:       67.4 in Accession #:    [id number]      Weight:       140.9 lb Date of Birth:  Aug 26, 1945       BSA:          1.749 m Patient Age:    76 years        BP:           134/71 mmHg Patient Gender: M               HR:           64 bpm. Exam Location:  [address] Procedure: 2D Echo, Cardiac Doppler, Color Doppler, 3D Echo and  Strain Analysis Indications:    Aortic valve disorder I35.9  History:        Patient has prior history of Echocardiogram examinations, most                 recent 08/23/2022. CAD, Prior CABG; Risk Factors:Dyslipidemia.                 Aortic Valve: 23 mm Edwards Inspiris valve is present in the                 aortic position. Procedure Date: 08/18/22.  Referring Phys: [id number] Maisie Fus  Sonographer Comments: Global longitudinal strain was attempted. IMPRESSIONS  1. Left ventricular ejection fraction, by estimation, is 50 to 55%. The left ventricle has low normal function. Left ventricular diastolic parameters were normal.  2. Right ventricular systolic function is mildly reduced. The right ventricular size is normal. There is normal pulmonary artery systolic pressure.  3. Left atrial size was mildly dilated.  4. The mitral valve is normal in structure. Mild mitral valve regurgitation.  5. S/p AVR (23 mm Edwards Inspiris (March 2024). Difficult to visualize Peak and mean gradients through the valve are 18 and 10 mm Hg respectively . The aortic valve has been repaired/replaced. Aortic valve regurgitation is not visualized. There is a 23  mm Edwards Inspiris valve present in the aortic position. Procedure Date: 08/18/22.  6. The inferior vena cava is normal in size with greater than 50% respiratory variability, suggesting right atrial pressure of 3 mmHg. FINDINGS  Left Ventricle: Left ventricular ejection fraction, by estimation, is 50 to 55%. The left ventricle has low normal function. The left ventricular internal cavity size was normal in size. There is no left ventricular hypertrophy. Left ventricular diastolic parameters were normal. Right Ventricle: The right ventricular size is normal. Right vetricular wall thickness was not assessed. Right ventricular systolic function is mildly reduced. There  is normal pulmonary artery systolic pressure. The tricuspid regurgitant velocity is 2.03  m/s, and with an assumed  right atrial pressure of 3 mmHg, the estimated right ventricular systolic pressure is 19.5 mmHg. Left Atrium: Left atrial size was mildly dilated. Right Atrium: Right atrial size was normal in size. Pericardium: There is no evidence of pericardial effusion. Mitral Valve: The mitral valve is normal in structure. Mild mitral valve regurgitation. Tricuspid Valve: The tricuspid valve is normal in structure. Tricuspid valve regurgitation is trivial. Aortic Valve: S/p AVR (23 mm Edwards Inspiris (March 2024). Difficult to visualize Peak and mean gradients through the valve are 18 and 10 mm Hg respectively. The aortic valve has been repaired/replaced. Aortic valve regurgitation is not visualized. Aortic valve mean gradient measures 9.2 mmHg. Aortic valve peak gradient measures 17.5 mmHg. Aortic valve area, by VTI measures 2.43 cm. There is a 23 mm Edwards Inspiris valve present in the aortic position. Procedure Date: 08/18/22. Pulmonic Valve: The pulmonic valve was normal in structure. Pulmonic valve regurgitation is not visualized. Aorta: The aortic root and ascending aorta are structurally normal, with no evidence of dilitation. Venous: The inferior vena cava is normal in size with greater than 50% respiratory variability, suggesting right atrial pressure of 3 mmHg. IAS/Shunts: The interatrial septum was not assessed.  LEFT VENTRICLE PLAX 2D LVIDd:         4.90 cm     Diastology LVIDs:         3.40 cm     LV e' medial:    7.87 cm/s LV PW:         0.80 cm     LV E/e' medial:  6.2 LV IVS:        0.80 cm     LV e' lateral:   12.80 cm/s LVOT diam:     2.30 cm     LV E/e' lateral: 3.8 LV SV:         101 LV SV Index:   57          2D Longitudinal Strain LVOT Area:     4.15 cm    2D Strain GLS Avg:     17.7 %  LV Volumes (MOD) LV vol d, MOD A2C: 88.3 ml 3D Volume EF: LV vol d, MOD A4C: 91.8 ml 3D EF:        53 % LV vol s, MOD A2C: 38.4 ml LV EDV:       160 ml LV vol s, MOD A4C: 40.0 ml LV ESV:       76 ml LV SV MOD A2C:      49.9 ml LV SV:        84 ml LV SV MOD A4C:     91.8 ml LV SV MOD BP:      50.6 ml RIGHT VENTRICLE RV S prime:     10.50 cm/s TAPSE (M-mode): 1.3 cm LEFT ATRIUM             Index        RIGHT ATRIUM           Index LA diam:        4.70 cm 2.69 cm/m   RA Area:     16.60 cm LA Vol (A2C):   54.4 ml 31.10 ml/m  RA Volume:   39.90 ml  22.81 ml/m LA Vol (A4C):   57.7 ml 32.99 ml/m LA Biplane Vol: 56.0 ml 32.02 ml/m  AORTIC VALVE AV Area (Vmax):  2.47 cm AV Area (Vmean):   2.30 cm AV Area (VTI):     2.43 cm AV Vmax:           209.00 cm/s AV Vmean:          140.000 cm/s AV VTI:            0.414 m AV Peak Grad:      17.5 mmHg AV Mean Grad:      9.2 mmHg LVOT Vmax:         124.00 cm/s LVOT Vmean:        77.600 cm/s LVOT VTI:          0.242 m LVOT/AV VTI ratio: 0.58  AORTA Ao Root diam: 2.90 cm Ao Asc diam:  3.60 cm MITRAL VALVE               TRICUSPID VALVE MV Area (PHT): 2.32 cm    TR Peak grad:   16.5 mmHg MV Decel Time: 327 msec    TR Vmax:        203.00 cm/s MR Peak grad: 107.3 mmHg MR Mean grad: 69.0 mmHg    SHUNTS MR Vmax:      518.00 cm/s  Systemic VTI:  0.24 m MR Vmean:     397.0 cm/s   Systemic Diam: 2.30 cm MV E velocity: 48.90 cm/s MV A velocity: 46.00 cm/s MV E/A ratio:  1.06 Dietrich Pates MD Electronically signed by Dietrich Pates MD Signature Date/Time: 09/30/2022/8:44:32 PM    Final    CT Cervical Spine Wo Contrast  Result Date: 09/26/2022 CLINICAL DATA:  77 year old male with left posterior neck pain, decreased range of motion. EXAM: CT CERVICAL SPINE WITHOUT CONTRAST TECHNIQUE: Multidetector CT imaging of the cervical spine was performed without intravenous contrast. Multiplanar CT image reconstructions were also generated. RADIATION DOSE REDUCTION: This exam was performed according to the departmental dose-optimization program which includes automated exposure control, adjustment of the mA and/or kV according to patient size and/or use of iterative reconstruction technique. COMPARISON:  Cervical spine  MRI 03/21/2017. FINDINGS: Alignment: Mild straightening of cervical lordosis has not significantly changed. Cervicothoracic junction alignment is within normal limits. Bilateral posterior element alignment is within normal limits. Skull base and vertebrae: Bone mineralization is within normal limits for age. Visualized skull base is intact. No atlanto-occipital dissociation. C1 and C2 appear intact and aligned. No acute osseous abnormality identified. Soft tissues and spinal canal: No prevertebral fluid or swelling. No visible canal hematoma. Negative visible noncontrast neck soft tissues aside from calcified carotid atherosclerosis. Disc levels:  C2-C3 facet ankylosis on the left. Superimposed chronic C1-odontoid degeneration. And disc and endplate degeneration C3-C4 through C6-C7. C5-C6 and C6-C7 dominant and somewhat bulky disc and endplate degeneration. Spinal stenosis at those levels, probably stable from the 2018 MRI. No other cervical ankylosis. Upper chest: Visible upper thoracic levels appear intact. Lung apices are clear. Other: Negative visible noncontrast brain parenchyma. Calcified atherosclerosis at the skull base. IMPRESSION: 1. No acute osseous abnormality in the cervical spine. 2. Chronic left C2-C3 facet ankylosis, with at least moderately advanced cervical spine degeneration elsewhere. Mild chronic spinal stenosis at C5-C6 and C6-C7, probably stable from a 2018 MRI. Electronically Signed   By: Odessa Fleming M.D.   On: 09/26/2022 12:32   DG Chest 2 View  Result Date: 09/09/2022 CLINICAL DATA:  Status post aortic valve replacement. EXAM: CHEST - 2 VIEW COMPARISON:  Chest radiographs 08/24/2022 FINDINGS: Sequelae of CABG, aortic valve replacement, and left atrial appendage clipping  are again identified. The cardiac silhouette is borderline enlarged. Small pleural effusions on the prior study have decreased in size on the left and resolved on the right. The lungs are better inflated, and no airspace  consolidation, edema, or pneumothorax is identified. No acute osseous abnormality is seen. IMPRESSION: Improved lung aeration. Decreased, small residual left pleural effusion. Resolved right pleural effusion. Electronically Signed   By: Sebastian Ache M.D.   On: 09/09/2022 13:01   DG Shoulder Right  Result Date: 09/06/2022 CLINICAL DATA:  Right shoulder pain beginning yesterday. EXAM: RIGHT SHOULDER - 2+ VIEW COMPARISON:  None Available. FINDINGS: There is no evidence of acute osseous abnormality. There is minimal degenerative change about the glenohumeral and acromioclavicular joints. There is mild greater tuberosity surface irregularity. The humeral head is slightly high-riding within the glenoid. The soft tissues are unremarkable. IMPRESSION: Slightly high-riding humeral head and mild greater tuberosity surface irregularity suggesting rotator cuff pathology. Electronically Signed   By: Lesia Hausen M.D.   On: 09/06/2022 11:32   Disposition   Pt is being discharged home today in good condition.  Follow-up Plans & Appointments     Follow-up Information     Maisie Fus, MD Follow up on 10/28/2022.   Specialty: Cardiology Why: Appointment at 8 AM Contact information: [address] [place] [address] [phone]                Discharge Instructions     Amb referral to [facility]   Complete by: As directed    Call MD for:  difficulty breathing, headache or visual disturbances   Complete by: As directed    Call MD for:  persistant dizziness or light-headedness   Complete by: As directed    Diet - low sodium heart healthy   Complete by: As directed    Discharge instructions   Complete by: As directed    Notify the Office if systolic blood pressure (top number) is consistently less than 95, or if you develop dizziness, lightheadedness, near-loss of consciousness   Increase activity slowly   Complete by: As directed         Discharge Medications    Allergies as of 10/02/2022       Reactions   Atorvastatin Other (See Comments)   Increased LFTs        Medication List     STOP taking these medications    clonazePAM 0.5 MG tablet Commonly known as: KLONOPIN   HYDROcodone-acetaminophen 5-325 MG tablet Commonly known as: Norco   midodrine 10 MG tablet Commonly known as: PROAMATINE   traMADol 50 MG tablet Commonly known as: ULTRAM       TAKE these medications    ascorbic acid 500 MG tablet Commonly known as: VITAMIN C Take 500 mg by mouth 2 (two) times daily.   aspirin EC 81 MG tablet Take 1 tablet (81 mg total) by mouth daily.   beta carotene 21308 UNIT capsule Take 25,000 Units by mouth daily.   clopidogrel 75 MG tablet Commonly known as: Plavix Take 1 tablet (75 mg total) by mouth daily.   fluticasone 50 MCG/ACT nasal spray Commonly known as: FLONASE SPRAY ONE SPRAY IN EACH NOSTRIL ONCE DAILY What changed:  how much to take how to take this when to take this additional instructions   furosemide 20 MG tablet Commonly known as: LASIX Take 1 tablet (20 mg total) by mouth daily. X 3 days, then use daily as needed for weight gain of  3-5 lbs in 24 hr time period   glycopyrrolate 2 MG tablet Commonly known as: ROBINUL Take 1 tablet (2 mg total) by mouth every morning.   levothyroxine 50 MCG tablet Commonly known as: SYNTHROID Take 1 tablet by mouth daily   methocarbamol 500 MG tablet Commonly known as: ROBAXIN Take 500 mg by mouth every 6 (six) hours as needed for muscle spasms.   metoprolol succinate 25 MG 24 hr tablet Commonly known as: TOPROL-XL Take 1 tablet (25 mg total) by mouth daily. Start taking on: Oct 03, 2022   potassium chloride 10 MEQ tablet Commonly known as: KLOR-CON M Take 1 tablet (10 mEq total) by mouth daily. X 3 days, then daily as needed on days you take Lasix   predniSONE 5 MG tablet Commonly known as: DELTASONE 6 tablets for 4 days, then 4 tablets for 4 days then 2  tablets for 4 days   pyridOXINE 100 MG tablet Commonly known as: VITAMIN B6 Take 100 mg by mouth daily.   rosuvastatin 40 MG tablet Commonly known as: CRESTOR TAKE 1 TABLET BY MOUTH DAILY   vitamin E 180 MG (400 UNITS) capsule Take 400 Units by mouth daily.           Outstanding Labs/Studies    Duration of Discharge Encounter   Greater than 30 minutes including physician time.  Signed, Jonita Albee, PA-C 10/02/2022, 12:06 PM

## 2022-10-02 NOTE — Progress Notes (Signed)
Rounding Note    Patient Name: Jeremy Waters Date of Encounter: 10/02/2022  [facility] Cardiologist: Maisie Fus, MD   Subjective   He feels well this AM. No issues  Inpatient Medications    Scheduled Meds:  ascorbic acid  500 mg Oral BID   aspirin EC  81 mg Oral Daily   clopidogrel  75 mg Oral Daily   levothyroxine  50 mcg Oral Daily   metoprolol succinate  12.5 mg Oral BID   pyridOXINE  100 mg Oral Daily   rosuvastatin  40 mg Oral Daily   Continuous Infusions:  PRN Meds: acetaminophen, clonazePAM, ondansetron (ZOFRAN) IV   Vital Signs    Vitals:   10/02/22 0637 10/02/22 0638 10/02/22 0639 10/02/22 0700  BP:    116/77  Pulse: (!) 55 (!) 56 (!) 55 (!) 57  Resp: 12 13 11 14   Temp:    97.8 F (36.6 C)  TempSrc:    Oral  SpO2: 97% 97% 97% 98%  Weight:      Height:        Intake/Output Summary (Last 24 hours) at 10/02/2022 0911 Last data filed at 10/02/2022 0800 Gross per 24 hour  Intake 339.4 ml  Output --  Net 339.4 ml      10/01/2022    9:14 PM 09/30/2022    3:09 PM 09/21/2022    9:06 AM  Last 3 Weights  Weight (lbs) 134 lb 14.7 oz 135 lb 140 lb 14 oz  Weight (kg) 61.2 kg 61.236 kg 63.9 kg      Telemetry    Sinus bradycardia 50s - Personally Reviewed  ECG    NSR, IRBBB - Personally Reviewed  Physical Exam   Vitals:   10/02/22 0639 10/02/22 0700  BP:  116/77  Pulse: (!) 55 (!) 57  Resp: 11 14  Temp:  97.8 F (36.6 C)  SpO2: 97% 98%    GEN: No acute distress.   Neck: No JVD MSK: median sternotomy scar clean and dry Cardiac: RRR, no murmurs, rubs, or gallops.  Respiratory: Clear to auscultation bilaterally. GI: Soft, nontender, non-distended  MS: No edema; No deformity. Neuro:  Nonfocal  Psych: Normal affect   Labs    High Sensitivity Troponin:   Recent Labs  Lab 10/01/22 2126 10/01/22 2326  TROPONINIHS 6 6     Chemistry Recent Labs  Lab 10/01/22 2126 10/02/22 0537  NA 134* 138  K 3.7 3.2*  CL 103 108   CO2 19* 21*  GLUCOSE 241* 99  BUN 28* 18  CREATININE 1.22 0.97  CALCIUM 9.9 8.8*  MG 2.5*  --   GFRNONAA >60 >60  ANIONGAP 12 9    Lipids No results for input(s): "CHOL", "TRIG", "HDL", "LABVLDL", "LDLCALC", "CHOLHDL" in the last 168 hours.  Hematology Recent Labs  Lab 10/01/22 2126 10/02/22 0537  WBC 8.4 6.8  RBC 4.32 3.86*  HGB 13.5 11.7*  HCT 39.8 35.4*  MCV 92.1 91.7  MCH 31.3 30.3  MCHC 33.9 33.1  RDW 14.0 13.9  PLT 405* 345   Thyroid  Recent Labs  Lab 10/01/22 2126  TSH 1.787    BNPNo results for input(s): "BNP", "PROBNP" in the last 168 hours.  DDimer No results for input(s): "DDIMER" in the last 168 hours.   Radiology    DG Chest Portable 1 View  Result Date: 10/01/2022 CLINICAL DATA:  Shortness of breath and chest pain EXAM: PORTABLE CHEST 1 VIEW COMPARISON:  09/09/2022 FINDINGS: Post  sternotomy changes with valve prosthesis and atrial appendage clip. No acute airspace disease or effusion. Normal cardiomediastinal silhouette. No pneumothorax IMPRESSION: No active disease. Electronically Signed   By: Jasmine Pang M.D.   On: 10/01/2022 22:00   ECHOCARDIOGRAM COMPLETE  Result Date: 09/30/2022    ECHOCARDIOGRAM REPORT   Patient Name:   Jeremy Waters Date of Exam: 09/30/2022 Medical Rec #:  [id number]       Height:       67.4 in Accession #:    [id number]      Weight:       140.9 lb Date of Birth:  11-02-1945       BSA:          1.749 m Patient Age:    76 years        BP:           134/71 mmHg Patient Gender: M               HR:           64 bpm. Exam Location:  [address] Procedure: 2D Echo, Cardiac Doppler, Color Doppler, 3D Echo and Strain Analysis Indications:    Aortic valve disorder I35.9  History:        Patient has prior history of Echocardiogram examinations, most                 recent 08/23/2022. CAD, Prior CABG; Risk Factors:Dyslipidemia.                 Aortic Valve: 23 mm Edwards Inspiris valve is present in the                 aortic position. Procedure  Date: 08/18/22.  Referring Phys: [id number] Maisie Fus  Sonographer Comments: Global longitudinal strain was attempted. IMPRESSIONS  1. Left ventricular ejection fraction, by estimation, is 50 to 55%. The left ventricle has low normal function. Left ventricular diastolic parameters were normal.  2. Right ventricular systolic function is mildly reduced. The right ventricular size is normal. There is normal pulmonary artery systolic pressure.  3. Left atrial size was mildly dilated.  4. The mitral valve is normal in structure. Mild mitral valve regurgitation.  5. S/p AVR (23 mm Edwards Inspiris (March 2024). Difficult to visualize Peak and mean gradients through the valve are 18 and 10 mm Hg respectively . The aortic valve has been repaired/replaced. Aortic valve regurgitation is not visualized. There is a 23  mm Edwards Inspiris valve present in the aortic position. Procedure Date: 08/18/22.  6. The inferior vena cava is normal in size with greater than 50% respiratory variability, suggesting right atrial pressure of 3 mmHg. FINDINGS  Left Ventricle: Left ventricular ejection fraction, by estimation, is 50 to 55%. The left ventricle has low normal function. The left ventricular internal cavity size was normal in size. There is no left ventricular hypertrophy. Left ventricular diastolic parameters were normal. Right Ventricle: The right ventricular size is normal. Right vetricular wall thickness was not assessed. Right ventricular systolic function is mildly reduced. There is normal pulmonary artery systolic pressure. The tricuspid regurgitant velocity is 2.03  m/s, and with an assumed right atrial pressure of 3 mmHg, the estimated right ventricular systolic pressure is 19.5 mmHg. Left Atrium: Left atrial size was mildly dilated. Right Atrium: Right atrial size was normal in size. Pericardium: There is no evidence of pericardial effusion. Mitral Valve: The mitral valve is normal in structure. Mild mitral valve  regurgitation.  Tricuspid Valve: The tricuspid valve is normal in structure. Tricuspid valve regurgitation is trivial. Aortic Valve: S/p AVR (23 mm Edwards Inspiris (March 2024). Difficult to visualize Peak and mean gradients through the valve are 18 and 10 mm Hg respectively. The aortic valve has been repaired/replaced. Aortic valve regurgitation is not visualized. Aortic valve mean gradient measures 9.2 mmHg. Aortic valve peak gradient measures 17.5 mmHg. Aortic valve area, by VTI measures 2.43 cm. There is a 23 mm Edwards Inspiris valve present in the aortic position. Procedure Date: 08/18/22. Pulmonic Valve: The pulmonic valve was normal in structure. Pulmonic valve regurgitation is not visualized. Aorta: The aortic root and ascending aorta are structurally normal, with no evidence of dilitation. Venous: The inferior vena cava is normal in size with greater than 50% respiratory variability, suggesting right atrial pressure of 3 mmHg. IAS/Shunts: The interatrial septum was not assessed.  LEFT VENTRICLE PLAX 2D LVIDd:         4.90 cm     Diastology LVIDs:         3.40 cm     LV e' medial:    7.87 cm/s LV PW:         0.80 cm     LV E/e' medial:  6.2 LV IVS:        0.80 cm     LV e' lateral:   12.80 cm/s LVOT diam:     2.30 cm     LV E/e' lateral: 3.8 LV SV:         101 LV SV Index:   57          2D Longitudinal Strain LVOT Area:     4.15 cm    2D Strain GLS Avg:     17.7 %  LV Volumes (MOD) LV vol d, MOD A2C: 88.3 ml 3D Volume EF: LV vol d, MOD A4C: 91.8 ml 3D EF:        53 % LV vol s, MOD A2C: 38.4 ml LV EDV:       160 ml LV vol s, MOD A4C: 40.0 ml LV ESV:       76 ml LV SV MOD A2C:     49.9 ml LV SV:        84 ml LV SV MOD A4C:     91.8 ml LV SV MOD BP:      50.6 ml RIGHT VENTRICLE RV S prime:     10.50 cm/s TAPSE (M-mode): 1.3 cm LEFT ATRIUM             Index        RIGHT ATRIUM           Index LA diam:        4.70 cm 2.69 cm/m   RA Area:     16.60 cm LA Vol (A2C):   54.4 ml 31.10 ml/m  RA Volume:   39.90 ml   22.81 ml/m LA Vol (A4C):   57.7 ml 32.99 ml/m LA Biplane Vol: 56.0 ml 32.02 ml/m  AORTIC VALVE AV Area (Vmax):    2.47 cm AV Area (Vmean):   2.30 cm AV Area (VTI):     2.43 cm AV Vmax:           209.00 cm/s AV Vmean:          140.000 cm/s AV VTI:            0.414 m AV Peak Grad:      17.5 mmHg AV  Mean Grad:      9.2 mmHg LVOT Vmax:         124.00 cm/s LVOT Vmean:        77.600 cm/s LVOT VTI:          0.242 m LVOT/AV VTI ratio: 0.58  AORTA Ao Root diam: 2.90 cm Ao Asc diam:  3.60 cm MITRAL VALVE               TRICUSPID VALVE MV Area (PHT): 2.32 cm    TR Peak grad:   16.5 mmHg MV Decel Time: 327 msec    TR Vmax:        203.00 cm/s MR Peak grad: 107.3 mmHg MR Mean grad: 69.0 mmHg    SHUNTS MR Vmax:      518.00 cm/s  Systemic VTI:  0.24 m MR Vmean:     397.0 cm/s   Systemic Diam: 2.30 cm MV E velocity: 48.90 cm/s MV A velocity: 46.00 cm/s MV E/A ratio:  1.06 Dietrich Pates MD Electronically signed by Dietrich Pates MD Signature Date/Time: 09/30/2022/8:44:32 PM    Final     Cardiac Studies   Echocardiogram 08/23/2022   IMPRESSIONS     1. Basal inferior,basala lateral hypokinesis.. Left ventricular ejection  fraction, by estimation, is 55 to 60%. The left ventricle has normal  function. Left ventricular diastolic parameters were normal.   2. Right ventricular systolic function is normal. The right ventricular  size is normal.   3. Left atrial size was mildly dilated.   4. Mild mitral valve regurgitation.   5. S/P AVR (23 mm INSPIRIS valve, procedure date 08/18/22). Peak and mean  gradients through the valve are 19 and 10 mm HG respectively. . The aortic  valve has been repaired/replaced. Aortic valve regurgitation is not  visualized.   6. The inferior vena cava is normal in size with <50% respiratory  variability, suggesting right atrial pressure of 8 mmHg.   FINDINGS   Left Ventricle: Basal inferior,basala lateral hypokinesis. Left  ventricular ejection fraction, by estimation, is 55 to 60%. The left   ventricle has normal function. The left ventricular internal cavity size  was normal in size. There is no left  ventricular hypertrophy. Left ventricular diastolic parameters were  normal.   Right Ventricle: The right ventricular size is normal. Right vetricular  wall thickness was not assessed. Right ventricular systolic function is  normal.   Left Atrium: Left atrial size was mildly dilated.   Right Atrium: Right atrial size was normal in size.   Pericardium: Trivial pericardial effusion is present.   Mitral Valve: There is mild thickening of the mitral valve leaflet(s).  Mild mitral valve regurgitation.   Tricuspid Valve: The tricuspid valve is normal in structure. Tricuspid  valve regurgitation is trivial.   Aortic Valve: S/P AVR (23 mm INSPIRIS valve, procedure date 08/18/22). Peak  and mean gradients through the valve are 19 and 10 mm HG respectively. The  aortic valve has been repaired/replaced. Aortic valve regurgitation is not  visualized. Aortic valve mean   gradient measures 9.5 mmHg. Aortic valve peak gradient measures 18.6  mmHg. Aortic valve area, by VTI measures 1.62 cm.   Pulmonic Valve: The pulmonic valve was thickened with good excursion.  Pulmonic valve regurgitation is trivial.   Aorta: The aortic root is normal in size and structure.   Venous: The inferior vena cava is normal in size with less than 50%  respiratory variability, suggesting right atrial pressure of 8 mmHg.  IAS/Shunts: No atrial level shunt detected by color flow Doppler.     Patient Profile     77 y.o. male  CAD, pAF,  s/p CABG LIMA to LAD and SVG to RCA on 08/18/2022, s/p bioprosthetic AVR, left atrial appendage clip/atrial clip, PVCs, hypothyroidism, hyperlipidemia  consult for atrial flutter  Assessment & Plan    Typical Atrial Flutter with variable AV block/Afib. Now in sinus rhythm. S/p MAZE. S/p LAA appendage clip and not dc'd on AC post-op. Can consider outpatient TEE to  confirm closure. He is maintaining sinus rhythm, start metop XL 25 mg daily.  No plans for antiarrhythmic at this time.   S/p CABGx2  (LIMA - LAD, SVG - RCA) 08/18/22 - continue DAPT and statin.   Aortic Valve Replacement (23mm INSPIRIS, SN [id number]) - stable  He can be discharged from a cardiology standpoint. Will see him in the clinic.  For questions or updates, please contact [facility] Please consult [email or web address] for contact info under        Signed, Maisie Fus, MD  10/02/2022, 9:11 AM

## 2022-10-02 NOTE — Progress Notes (Signed)
ANTICOAGULATION CONSULT NOTE - follow-up  Pharmacy Consult for heparin Indication: atrial fibrillation  Allergies  Allergen Reactions   Atorvastatin Other (See Comments)    Increased LFTs    Patient Measurements: Height: 5\' 7"  (170.2 cm) Weight: 61.2 kg (134 lb 14.7 oz) IBW/kg (Calculated) : 66.1 Heparin Dosing Weight: TBW  Vital Signs: Temp: 97.9 F (36.6 C) (05/11 0500) Temp Source: Axillary (05/11 0500) BP: 111/76 (05/11 0500) Pulse Rate: 63 (05/11 0500)  Labs: Recent Labs    10/01/22 2126 10/01/22 2326 10/02/22 0537  HGB 13.5  --  11.7*  HCT 39.8  --  35.4*  PLT 405*  --  345  HEPARINUNFRC  --   --  0.26*  CREATININE 1.22  --   --   TROPONINIHS 6 6  --      Estimated Creatinine Clearance: 45.3 mL/min (by C-G formula based on SCr of 1.22 mg/dL).   Medical History: Past Medical History:  Diagnosis Date   Adenomatous colon polyp 09/2007   Arthritis    OA / PAIN LEFT KNEE   Cancer (HCC)    prostate cancer   Cataract    removed both eyes   Coronary artery disease    COVID-19 virus infection    Diverticulosis of colon    w/o hemorrage   Heart murmur    TOLD HE HAS A SLIGHT MURMUR   Hyperlipidemia    Inguinal hernia    left side    Thyroid disease     Assessment: 28 YOM presenting with palpitations and fatigue, in Afib, he is not on anticoagulation PTA, CBC wnl   5/11 AM update: HL: 0.26- subtherapeutic Hgb: 11.7 No signs of bleeding or issues with the heparin gtt   Goal of Therapy:  Heparin level 0.3-0.7 units/ml Monitor platelets by anticoagulation protocol: Yes   Plan:  Give Heparin 1000 units IV x 1, and increase gtt to 1050 units/hr F/u 8 hour heparin level with AM labs F/u long term AC plan  Maelle Sheaffer BS, PharmD, BCPS Clinical Pharmacist 10/02/2022 6:12 AM  Contact: [phone] after 3 PM  "Be curious, not judgmental..." -Debbora Dus

## 2022-10-04 ENCOUNTER — Encounter: Payer: Self-pay | Admitting: Internal Medicine

## 2022-10-04 ENCOUNTER — Telehealth: Payer: Self-pay | Admitting: Internal Medicine

## 2022-10-04 ENCOUNTER — Encounter (HOSPITAL_COMMUNITY): Payer: PPO

## 2022-10-04 ENCOUNTER — Encounter (HOSPITAL_COMMUNITY)
Admission: RE | Admit: 2022-10-04 | Discharge: 2022-10-04 | Disposition: A | Discharge status: Home / Self Care | Payer: PPO | Source: Ambulatory Visit | Attending: Cardiology | Admitting: Cardiology

## 2022-10-04 DIAGNOSIS — I214 Non-ST elevation (NSTEMI) myocardial infarction: Secondary | ICD-10-CM

## 2022-10-04 DIAGNOSIS — Z952 Presence of prosthetic heart valve: Secondary | ICD-10-CM

## 2022-10-04 DIAGNOSIS — Z951 Presence of aortocoronary bypass graft: Secondary | ICD-10-CM

## 2022-10-04 NOTE — Telephone Encounter (Signed)
Patient called in, stating that his blood pressure has been running low- recent readings 87/57, most recent reading when he checked on the phone with me was 85/58 HR 67. Patient denies any symptoms, did say he became lightheaded when walking up stairs, he sat down rested and felt back to normal.  I did ask if he felt the machine was working okay- but patient states he has not had issues with it not working.   Patient states they have him taking Metoprolol 25 mg daily, and he takes it at night, however per ED notes patient should hold it if systolic was 95 or below.   Patient verbalized this plan as instructed previously. Will route to MD if she has any other recommendations.

## 2022-10-04 NOTE — Telephone Encounter (Signed)
Pt c/o BP issue: STAT if pt c/o blurred vision, one-sided weakness or slurred speech  1. What are your last 5 BP readings?   Last 3  readings have been 87/57 taken within 30 minutes   2. Are you having any other symptoms (ex. Dizziness, headache, blurred vision, passed out)?   Around lunch time he walked up some steps and he felt a little woozy but he sat down for a few minutes and it went away  3. What is your BP issue?    Patient called to report he was in cardiac rehab today and his BP has been consistently below 95.

## 2022-10-05 NOTE — Telephone Encounter (Signed)
Spoke with patine as he did not understand regarding "hold" of medication. Explained that if his BP is <95 do NOT take metoprolol.  He generally takes at night so advised to take BP in evening prior to medication.  If below 95, hold med.  If above he can take the medication.  If above 95 but symptoms of lightheaded, dizziness, or fainting then he should hold the medication and call us for further instructions.  Advised he could then recheck his BP in the morning to see if his BP is remaining steady (especially when not taking metoprolol(.  He asked if it is above 95 should he take his metoprolol at that time.  Advised no, only take at the  scheduled evening dose. We do not want him taking at multiple different times.  Advised only check the BP the two times as directed unless symptomatic.  He states understanding and if any issues will let us know. If BP starts to run high in the daytime or if still running under 95 without there metoprolol, he will let us know.  He will keep a log and update [place] on readings

## 2022-10-05 NOTE — Telephone Encounter (Signed)
Called patient, LVM to call back for recommendations from MD.  Left call back number.

## 2022-10-06 ENCOUNTER — Telehealth: Payer: Self-pay

## 2022-10-06 ENCOUNTER — Encounter (HOSPITAL_COMMUNITY)
Admission: RE | Admit: 2022-10-06 | Discharge: 2022-10-06 | Disposition: A | Discharge status: Home / Self Care | Payer: PPO | Source: Ambulatory Visit | Attending: Cardiology | Admitting: Cardiology

## 2022-10-06 DIAGNOSIS — Z952 Presence of prosthetic heart valve: Secondary | ICD-10-CM

## 2022-10-06 DIAGNOSIS — Z951 Presence of aortocoronary bypass graft: Secondary | ICD-10-CM

## 2022-10-06 DIAGNOSIS — I214 Non-ST elevation (NSTEMI) myocardial infarction: Secondary | ICD-10-CM

## 2022-10-06 NOTE — Patient Outreach (Unsigned)
  Care Coordination   Follow Up Visit Note   10/06/2022 Name: Jeremy Waters MRN: [id number] DOB: 05-08-1946  Jeremy Waters is a 77 y.o. year old male who sees Burns, Bobette Mo, MD for primary care. I spoke with  Jeremy Waters by phone today.  What matters to the patients health and wellness today?  RNCM received message that patient left a message on main line requesting a call back. RNCM returned call. Patient expressed he had concerns about low BP. Reports he received instructions regarding when to take and when not to take metoprolol. He states the word "hold" was still a little confusing to him. However, he is check blood pressure before taking metoprolol nightly and is not taking if BP is <95 systolic. He states last night BP at 10 pm was 112/77 HR 59 and he took the Metoprolol.   Goals Addressed             This Visit's Progress    continue to improve post hospitalization       Interventions Today    Flowsheet Row Most Recent Value  Chronic Disease   Chronic disease during today's visit Other  [h/o recent MI ED 09/30/22-10/01/22 due to ttachycardia]  General Interventions   General Interventions Discussed/Reviewed General Interventions Reviewed  [confirmed patient has is able to read messages from in [place] from cardiologist.]  Education Interventions   Education Provided Provided Education  Provided Verbal Education On Other  [reiterated instructions by cardiology office. clarified definition of "hold" means the same as "withhold" and "do not take". encouraged to continue to check and record BP and notify cardiologist if continues to remain <95 or signs/symptoms.]            SDOH assessments and interventions completed:  No{THN Tip this will not be part of the note when signed-REQUIRED REPORT FIELD DO NOT DELETE (Optional):27901}  Care Coordination Interventions:  Yes, provided {THN Tip this will not be part of the note when signed-REQUIRED REPORT FIELD DO NOT DELETE  (Optional):27901}  Follow up plan:  as previously scheduled next week    Encounter Outcome:  Pt. Visit Completed {THN Tip this will not be part of the note when signed-REQUIRED REPORT FIELD DO NOT DELETE (Optional):27901}  Kathyrn Sheriff, RN, MSN, BSN, CCM [facility] Care Coordinator [phone]

## 2022-10-06 NOTE — Patient Instructions (Signed)
Visit Information  Thank you for taking time to visit with me today. Please don't hesitate to contact me if I can be of assistance to you.   Following are the goals we discussed today:   Goals Addressed             This Visit's Progress    continue to improve post hospitalization       Interventions Today    Flowsheet Row Most Recent Value  Chronic Disease   Chronic disease during today's visit Other  [h/o recent MI ED 09/30/22-10/01/22 due to ttachycardia]  General Interventions   General Interventions Discussed/Reviewed General Interventions Reviewed  [confirmed patient has is able to read messages from in [place] from cardiologist.]  Education Interventions   Education Provided Provided Education  Provided Verbal Education On Other  [reiterated instructions by cardiology office. clarified definition of "hold" means the same as "withhold" and "do not take". encouraged to continue to check and record BP and notify cardiologist if continues to remain <95 or signs/symptoms.]            Our next appointment is by telephone on 10/12/22 at 9:45 am  Please call the care guide team at [phone] if you need to cancel or reschedule your appointment.   If you are experiencing a Mental Health or Behavioral Health Crisis or need someone to talk to, please call the Suicide and Crisis Lifeline: 76  Kathyrn Sheriff, RN, MSN, BSN, CCM [facility] Care Coordinator [phone]

## 2022-10-08 ENCOUNTER — Encounter (HOSPITAL_COMMUNITY): Payer: PPO

## 2022-10-08 ENCOUNTER — Encounter (HOSPITAL_COMMUNITY)
Admission: RE | Admit: 2022-10-08 | Discharge: 2022-10-08 | Disposition: A | Discharge status: Home / Self Care | Payer: PPO | Source: Ambulatory Visit | Attending: Cardiology | Admitting: Cardiology

## 2022-10-08 DIAGNOSIS — Z951 Presence of aortocoronary bypass graft: Secondary | ICD-10-CM

## 2022-10-08 DIAGNOSIS — I214 Non-ST elevation (NSTEMI) myocardial infarction: Secondary | ICD-10-CM

## 2022-10-08 DIAGNOSIS — Z952 Presence of prosthetic heart valve: Secondary | ICD-10-CM

## 2022-10-08 NOTE — Telephone Encounter (Signed)
Please see MyChart encounter.

## 2022-10-11 ENCOUNTER — Encounter (HOSPITAL_COMMUNITY)
Admission: RE | Admit: 2022-10-11 | Discharge: 2022-10-11 | Disposition: A | Discharge status: Home / Self Care | Payer: PPO | Source: Ambulatory Visit | Attending: Cardiology | Admitting: Cardiology

## 2022-10-11 ENCOUNTER — Encounter (HOSPITAL_COMMUNITY): Payer: PPO

## 2022-10-11 DIAGNOSIS — I214 Non-ST elevation (NSTEMI) myocardial infarction: Secondary | ICD-10-CM | POA: Diagnosis not present

## 2022-10-11 DIAGNOSIS — Z952 Presence of prosthetic heart valve: Secondary | ICD-10-CM

## 2022-10-11 DIAGNOSIS — Z951 Presence of aortocoronary bypass graft: Secondary | ICD-10-CM

## 2022-10-12 ENCOUNTER — Ambulatory Visit: Payer: Self-pay

## 2022-10-12 NOTE — Patient Instructions (Signed)
Visit Information  Thank you for taking time to visit with me today. Please don't hesitate to contact me if I can be of assistance to you.   Following are the goals we discussed today:  Continue to take medications as prescribed. Continue to attend provider visits as scheduled Continue to eat healthy, lean meats, vegetables, fruits, avoid saturated and transfats  Our next appointment is by telephone on 11/02/22 at 9:30 am  Please call the care guide team at [phone] if you need to cancel or reschedule your appointment.   If you are experiencing a Mental Health or Behavioral Health Crisis or need someone to talk to, please call the Suicide and Crisis Lifeline: 11  Kathyrn Sheriff, RN, MSN, BSN, CCM [facility] Care Coordinator [phone]

## 2022-10-12 NOTE — Patient Outreach (Signed)
  Care Coordination   Follow Up Visit Note   10/12/2022 Name: Jeremy Waters MRN: [id number] DOB: [birth year]  Jeremy Waters is a 77 y.o. year old male who sees Burns, Bobette Mo, MD for primary care. I spoke with  Jeremy Waters by phone today.  What matters to the patients health and wellness today?  Mr. Anchondo reports he is improving and is attending Cardiac Rehab. He reports he walked 1.5 miles this morning. He denies having any dizzy spell. Upcoming appoint 10/28/22 with cardiology.   Goals Addressed             This Visit's Progress    continue to improve post hospitalization       Interventions Today    Flowsheet Row Most Recent Value  Chronic Disease   Chronic disease during today's visit Other  [h/o recent MI ED 09/30/22-10/01/22 due to tachycardia]  General Interventions   General Interventions Discussed/Reviewed General Interventions Reviewed, Doctor Visits  Doctor Visits Discussed/Reviewed Specialist  PCP/Specialist Visits Compliance with follow-up visit  [reviewed upcoming appointments, encouraged patient to continue to communicate BP readings and any questions or concerns to cardiologist]  Exercise Interventions   Exercise Discussed/Reviewed Exercise Reviewed  [positive feedback regarding participation in cardiac rehab.]  Education Interventions   Provided Verbal Education On --  [advised to continue to communicate with provider with any health questions or concerns]  Pharmacy Interventions   Pharmacy Dicussed/Reviewed Pharmacy Topics Reviewed  Safety Interventions   Safety Discussed/Reviewed Safety Reviewed            SDOH assessments and interventions completed:  No  Care Coordination Interventions:  Yes, provided   Follow up plan: Follow up call scheduled for 11/02/22    Encounter Outcome:  Pt. Visit Completed    Kathyrn Sheriff, RN, MSN, BSN, CCM [facility] Care Coordinator [phone]

## 2022-10-13 ENCOUNTER — Encounter (HOSPITAL_COMMUNITY): Payer: PPO

## 2022-10-13 ENCOUNTER — Encounter (HOSPITAL_COMMUNITY)
Admission: RE | Admit: 2022-10-13 | Discharge: 2022-10-13 | Disposition: A | Discharge status: Home / Self Care | Payer: PPO | Source: Ambulatory Visit | Attending: Cardiology | Admitting: Cardiology

## 2022-10-13 DIAGNOSIS — Z952 Presence of prosthetic heart valve: Secondary | ICD-10-CM

## 2022-10-13 DIAGNOSIS — I214 Non-ST elevation (NSTEMI) myocardial infarction: Secondary | ICD-10-CM

## 2022-10-13 DIAGNOSIS — Z951 Presence of aortocoronary bypass graft: Secondary | ICD-10-CM

## 2022-10-15 ENCOUNTER — Encounter (HOSPITAL_COMMUNITY): Payer: PPO

## 2022-10-15 ENCOUNTER — Encounter (HOSPITAL_COMMUNITY)
Admission: RE | Admit: 2022-10-15 | Discharge: 2022-10-15 | Disposition: A | Discharge status: Home / Self Care | Payer: PPO | Source: Ambulatory Visit | Attending: Cardiology | Admitting: Cardiology

## 2022-10-15 DIAGNOSIS — Z951 Presence of aortocoronary bypass graft: Secondary | ICD-10-CM

## 2022-10-15 DIAGNOSIS — I214 Non-ST elevation (NSTEMI) myocardial infarction: Secondary | ICD-10-CM | POA: Diagnosis not present

## 2022-10-15 DIAGNOSIS — Z952 Presence of prosthetic heart valve: Secondary | ICD-10-CM

## 2022-10-20 ENCOUNTER — Encounter (HOSPITAL_COMMUNITY): Payer: PPO

## 2022-10-20 ENCOUNTER — Encounter (HOSPITAL_COMMUNITY)
Admission: RE | Admit: 2022-10-20 | Discharge: 2022-10-20 | Disposition: A | Discharge status: Home / Self Care | Payer: PPO | Source: Ambulatory Visit | Attending: Cardiology | Admitting: Cardiology

## 2022-10-20 DIAGNOSIS — Z952 Presence of prosthetic heart valve: Secondary | ICD-10-CM

## 2022-10-20 DIAGNOSIS — Z951 Presence of aortocoronary bypass graft: Secondary | ICD-10-CM

## 2022-10-20 DIAGNOSIS — M545 Low back pain, unspecified: Secondary | ICD-10-CM | POA: Diagnosis not present

## 2022-10-20 DIAGNOSIS — M542 Cervicalgia: Secondary | ICD-10-CM | POA: Diagnosis not present

## 2022-10-20 DIAGNOSIS — I214 Non-ST elevation (NSTEMI) myocardial infarction: Secondary | ICD-10-CM

## 2022-10-21 ENCOUNTER — Other Ambulatory Visit: Payer: Self-pay | Admitting: Physician Assistant

## 2022-10-22 ENCOUNTER — Encounter (HOSPITAL_COMMUNITY)
Admission: RE | Admit: 2022-10-22 | Discharge: 2022-10-22 | Disposition: A | Discharge status: Home / Self Care | Payer: PPO | Source: Ambulatory Visit | Attending: Cardiology | Admitting: Cardiology

## 2022-10-22 ENCOUNTER — Encounter (HOSPITAL_COMMUNITY): Payer: PPO

## 2022-10-22 DIAGNOSIS — I214 Non-ST elevation (NSTEMI) myocardial infarction: Secondary | ICD-10-CM | POA: Diagnosis not present

## 2022-10-22 DIAGNOSIS — Z951 Presence of aortocoronary bypass graft: Secondary | ICD-10-CM

## 2022-10-22 DIAGNOSIS — Z952 Presence of prosthetic heart valve: Secondary | ICD-10-CM

## 2022-10-24 ENCOUNTER — Encounter: Payer: Self-pay | Admitting: Internal Medicine

## 2022-10-25 ENCOUNTER — Encounter (HOSPITAL_COMMUNITY): Payer: PPO

## 2022-10-25 ENCOUNTER — Encounter (HOSPITAL_COMMUNITY)
Admission: RE | Admit: 2022-10-25 | Discharge: 2022-10-25 | Disposition: A | Discharge status: Home / Self Care | Payer: PPO | Source: Ambulatory Visit | Attending: Internal Medicine | Admitting: Internal Medicine

## 2022-10-25 DIAGNOSIS — Z952 Presence of prosthetic heart valve: Secondary | ICD-10-CM | POA: Insufficient documentation

## 2022-10-25 DIAGNOSIS — Z951 Presence of aortocoronary bypass graft: Secondary | ICD-10-CM | POA: Diagnosis not present

## 2022-10-25 DIAGNOSIS — I214 Non-ST elevation (NSTEMI) myocardial infarction: Secondary | ICD-10-CM | POA: Insufficient documentation

## 2022-10-26 ENCOUNTER — Ambulatory Visit: Payer: PPO | Admitting: Internal Medicine

## 2022-10-26 ENCOUNTER — Encounter: Payer: Self-pay | Admitting: Internal Medicine

## 2022-10-26 NOTE — Progress Notes (Signed)
Cardiac Individual Treatment Plan  Patient Details  Name: Jeremy Waters MRN: [id number] Date of Birth: [birth year] Referring Provider:   Flowsheet Row INTENSIVE CARDIAC REHAB ORIENT from 09/21/2022 in [facility]  Referring Provider Enter, Waverly Ferrari, MD       Initial Encounter Date:  Flowsheet Row INTENSIVE CARDIAC REHAB ORIENT from 09/21/2022 in [facility] for Heart, Vascular, & Lung Health  Date 09/21/22       Visit Diagnosis: 08/15/22 NSTEMI (non-ST elevated myocardial infarction) (HCC)  08/18/22 S/P CABG x 2  08/18/22 S/P AVR (aortic valve replacement)  Patient's Home Medications on Admission:  Current Outpatient Medications:    aspirin EC 81 MG tablet, Take 1 tablet (81 mg total) by mouth daily., Disp: , Rfl:    beta carotene 40981 UNIT capsule, Take 25,000 Units by mouth daily., Disp: , Rfl:    clopidogrel (PLAVIX) 75 MG tablet, Take 1 tablet (75 mg total) by mouth daily., Disp: 30 tablet, Rfl: 11   fluticasone (FLONASE) 50 MCG/ACT nasal spray, SPRAY ONE SPRAY IN EACH NOSTRIL ONCE DAILY (Patient taking differently: Place 1 spray into both nostrils daily.), Disp: 16 mL, Rfl: 3   furosemide (LASIX) 20 MG tablet, Take 1 tablet (20 mg total) by mouth daily. X 3 days, then use daily as needed for weight gain of 3-5 lbs in 24 hr time period (Patient not taking: Reported on 09/21/2022), Disp: 30 tablet, Rfl: 1   glycopyrrolate (ROBINUL) 2 MG tablet, Take 1 tablet (2 mg total) by mouth every morning., Disp: 90 tablet, Rfl: 0   levothyroxine (SYNTHROID) 50 MCG tablet, Take 1 tablet by mouth daily, Disp: 90 tablet, Rfl: 2   methocarbamol (ROBAXIN) 500 MG tablet, Take 500 mg by mouth every 6 (six) hours as needed for muscle spasms., Disp: , Rfl:    metoprolol succinate (TOPROL-XL) 25 MG 24 hr tablet, Take 1 tablet (25 mg total) by mouth daily., Disp: 90 tablet, Rfl: 3   potassium chloride (KLOR-CON M) 10 MEQ  tablet, Take 1 tablet (10 mEq total) by mouth daily. X 3 days, then daily as needed on days you take Lasix (Patient not taking: Reported on 09/21/2022), Disp: 30 tablet, Rfl: 1   predniSONE (DELTASONE) 5 MG tablet, 6 tablets for 4 days, then 4 tablets for 4 days then 2 tablets for 4 days, Disp: , Rfl:    pyridOXINE (VITAMIN B-6) 100 MG tablet, Take 100 mg by mouth daily., Disp: , Rfl:    rosuvastatin (CRESTOR) 40 MG tablet, TAKE 1 TABLET BY MOUTH DAILY, Disp: 90 tablet, Rfl: 1   vitamin C (ASCORBIC ACID) 500 MG tablet, Take 500 mg by mouth 2 (two) times daily., Disp: , Rfl:    vitamin E 400 UNIT capsule, Take 400 Units by mouth daily., Disp: , Rfl:   Past Medical History: Past Medical History:  Diagnosis Date   Adenomatous colon polyp 09/2007   Arthritis    OA / PAIN LEFT KNEE   Cancer (HCC)    prostate cancer   Cataract    removed both eyes   Coronary artery disease    COVID-19 virus infection    Diverticulosis of colon    w/o hemorrage   Heart murmur    TOLD HE HAS A SLIGHT MURMUR   Hyperlipidemia    Inguinal hernia    left side    Thyroid disease     Tobacco Use: Social History   Tobacco Use  Smoking Status Former  Smokeless Tobacco Never  Tobacco Comments   stopped smoking cigarettes 1983, 1 cigar /day 2001-2013    Labs: Review Flowsheet  More data exists      Latest Ref Rng & Units 05/04/2022 08/16/2022 08/17/2022 08/18/2022 08/19/2022  Labs for ITP Cardiac and Pulmonary Rehab  Cholestrol 0 - 200 mg/dL 161  - 096  - -  LDL (calc) 0 - 99 mg/dL 75  - 85  - -  HDL-C >04 mg/dL 54.09  - 61  - -  Trlycerides <150 mg/dL 81.1  - 63  - -  Hemoglobin A1c 4.8 - 5.6 % 5.7  5.4  - - -  PH, Arterial 7.35 - 7.45 - - - 7.308  7.336  7.386  7.365  7.375  7.388  7.337  7.425  7.405   PCO2 arterial 32 - 48 mmHg - - - 41.7  37.1  37.5  41.4  40.7  40.0  34.9  34.6  34.7   Bicarbonate 20.0 - 28.0 mmol/L - - - 21.0  20.3  22.5  23.7  23.8  24.1  18.7  22.6  21.6   TCO2 22 - 32 mmol/L -  - - 22  22  24  26  26  25  25  25  25  25  28  20  26  24  23    Acid-base deficit 0.0 - 2.0 mmol/L - - - 5.0  6.0  2.0  2.0  1.0  1.0  6.0  1.0  3.0   O2 Saturation % - - - 99  99  100  100  89  100  100  98  99     Capillary Blood Glucose: Lab Results  Component Value Date   GLUCAP 88 08/23/2022   GLUCAP 83 08/23/2022   GLUCAP 87 08/22/2022   GLUCAP 241 (H) 08/22/2022   GLUCAP 124 (H) 08/22/2022     Exercise Target Goals: Exercise Program Goal: Individual exercise prescription set using results from initial 6 min walk test and THRR while considering  patient's activity barriers and safety.   Exercise Prescription Goal: Initial exercise prescription builds to 30-45 minutes a day of aerobic activity, 2-3 days per week.  Home exercise guidelines will be given to patient during program as part of exercise prescription that the participant will acknowledge.  Activity Barriers & Risk Stratification:  Activity Barriers & Cardiac Risk Stratification - 09/21/22 1030       Activity Barriers & Cardiac Risk Stratification   Activity Barriers Left Knee Replacement;Back Problems   Hx 3 back surgeries. Sees PT every 3 weeks for back.   Cardiac Risk Stratification High             6 Minute Walk:  6 Minute Walk     Row Name 09/21/22 1041         6 Minute Walk   Phase Initial     Distance 1327 feet     Walk Time 6 minutes     # of Rest Breaks 0     MPH 2.51     METS 2.76     RPE 11     Perceived Dyspnea  0     VO2 Peak 9.66     Symptoms No     Resting HR 61 bpm     Resting BP 94/66     Resting Oxygen Saturation  98 %     Exercise Oxygen Saturation  during 6 min  walk 96 %     Max Ex. HR 78 bpm     Max Ex. BP 124/85     2 Minute Post BP 114/79              Oxygen Initial Assessment:   Oxygen Re-Evaluation:   Oxygen Discharge (Final Oxygen Re-Evaluation):   Initial Exercise Prescription:  Initial Exercise Prescription - 09/21/22 1100       Date of  Initial Exercise RX and Referring Provider   Date 09/21/22    Referring Provider Enter, Waverly Ferrari, MD    Expected Discharge Date 12/03/22      Recumbant Elliptical   Level 1    Minutes 15    METs 2.8      Track   Laps 14    Minutes 15    METs 2.79      Prescription Details   Frequency (times per week) 3    Duration Progress to 30 minutes of continuous aerobic without signs/symptoms of physical distress      Intensity   THRR 40-80% of Max Heartrate 58-116    Ratings of Perceived Exertion 11-13    Perceived Dyspnea 0-4      Progression   Progression Continue to progress workloads to maintain intensity without signs/symptoms of physical distress.      Resistance Training   Training Prescription Yes    Weight 3 lbs    Reps 10-15             Perform Capillary Blood Glucose checks as needed.  Exercise Prescription Changes:   Exercise Prescription Changes     Row Name 09/29/22 [phone] 10/11/22 0835           Response to Exercise   Blood Pressure (Admit) 94/64 100/62      Blood Pressure (Exercise) 124/76 98/62      Blood Pressure (Exit) 100/60 98/60      Heart Rate (Admit) 76 bpm 60 bpm      Heart Rate (Exercise) 112 bpm 108 bpm      Heart Rate (Exit) 68 bpm 67 bpm      Rating of Perceived Exertion (Exercise) 12 13      Perceived Dyspnea (Exercise) 0 0      Symptoms none none      Comments Pt first day in the CRP2 program. REVD MET's, goals and home EXrx      Duration Progress to 30 minutes of  aerobic without signs/symptoms of physical distress Progress to 30 minutes of  aerobic without signs/symptoms of physical distress      Intensity THRR unchanged THRR unchanged        Progression   Progression Continue to progress workloads to maintain intensity without signs/symptoms of physical distress. Continue to progress workloads to maintain intensity without signs/symptoms of physical distress.      Average METs -- 5.13        Resistance Training   Training  Prescription No No        Recumbant Elliptical   Level 1 2      RPM 73 74      Watts 100 102      Minutes 15 15      METs 6.3 6.3        Track   Laps 18 24      Minutes 15 15      METs 3.3 4.06        Home Exercise Plan   Plans to continue exercise  at -- Home (comment)      Frequency -- Add 3 additional days to program exercise sessions.      Initial Home Exercises Provided -- 10/11/22               Exercise Comments:   Exercise Comments     Row Name 09/29/22 0845 10/11/22 0843         Exercise Comments Pt first day in the CRP2 [facility]rit[facility] Program. Pt tolerated exercise well with an average MET level of 4.8. Pt is off to a great start and is learning his THRR, RPE and ExRx Pt first day in the CRP2 program. Pt tolerated exercise well with an average MET level of 5.13. Pt feels good about his goals, he is increasing MET's, strength and stamina. Pt is excited about doing more for home ExRx. He is already walking, but he will add in some sternal precautions core exercises and hand weights for 30-45 mins 5-7 days a week. Pt will let me know if the new exercises interfere with chronic back issues. Pt is feeling good about his progress so far.               Exercise Goals and Review:   Exercise Goals     Row Name 09/21/22 0946             Exercise Goals   Increase Physical Activity Yes       Intervention Provide advice, education, support and counseling about physical activity/exercise needs.;Develop an individualized exercise prescription for aerobic and resistive training based on initial evaluation findings, risk stratification, comorbidities and participant's personal goals.       Expected Outcomes Short Term: Attend rehab on a regular basis to increase amount of physical activity.;Long Term: Add in home exercise to make exercise part of routine and to increase amount of physical activity.;Long Term: Exercising regularly at least 3-5 days a week.       Increase  Strength and Stamina Yes       Intervention Provide advice, education, support and counseling about physical activity/exercise needs.;Develop an individualized exercise prescription for aerobic and resistive training based on initial evaluation findings, risk stratification, comorbidities and participant's personal goals.       Expected Outcomes Short Term: Increase workloads from initial exercise prescription for resistance, speed, and METs.;Short Term: Perform resistance training exercises routinely during rehab and add in resistance training at home;Long Term: Improve cardiorespiratory fitness, muscular endurance and strength as measured by increased METs and functional capacity ( )       Able to understand and use rate of perceived exertion (RPE) scale Yes       Intervention Provide education and explanation on how to use RPE scale       Expected Outcomes Short Term: Able to use RPE daily in rehab to express subjective intensity level;Long Term:  Able to use RPE to guide intensity level when exercising independently       Knowledge and understanding of Target Heart Rate Range (THRR) Yes       Intervention Provide education and explanation of THRR including how the numbers were predicted and where they are located for reference       Expected Outcomes Short Term: Able to state/look up THRR;Long Term: Able to use THRR to govern intensity when exercising independently;Short Term: Able to use daily as guideline for intensity in rehab       Able to check pulse independently Yes       Intervention Provide  education and demonstration on how to check pulse in carotid and radial arteries.;Review the importance of being able to check your own pulse for safety during independent exercise       Expected Outcomes Short Term: Able to explain why pulse checking is important during independent exercise;Long Term: Able to check pulse independently and accurately       Understanding of Exercise Prescription Yes        Intervention Provide education, explanation, and written materials on patient's individual exercise prescription       Expected Outcomes Short Term: Able to explain program exercise prescription;Long Term: Able to explain home exercise prescription to exercise independently                Exercise Goals Re-Evaluation :  Exercise Goals Re-Evaluation     Row Name 09/29/22 0843 10/11/22 0838           Exercise Goal Re-Evaluation   Exercise Goals Review Increase Physical Activity;Understanding of Exercise Prescription;Increase Strength and Stamina;Knowledge and understanding of Target Heart Rate Range (THRR);Able to understand and use rate of perceived exertion (RPE) scale Increase Physical Activity;Understanding of Exercise Prescription;Increase Strength and Stamina;Knowledge and understanding of Target Heart Rate Range (THRR);Able to understand and use rate of perceived exertion (RPE) scale      Comments Pt first day in the CRP2 [facility]rit[facility]in Program. Pt tolerated exercise well with an average MET level of 4.8. Pt is off to a great start and is learning his THRR, RPE and ExRx Pt first day in the CRP2 program. Pt tolerated exercise well with an average MET level of 5.13. Pt feels good about his goals, he is increasing MET's, strength and stamina. Pt is excited about doing more for home ExRx. He is already walking, but he will add in some sternal precautions core exercises and hand weights for 30-45 mins 5-7 days a week. Pt will let me know if the new exercises interfere with chronic back issues. Pt is feeling good about his progress so far.      Expected Outcomes Will continue to monitor pt and progress work loads as tolerated without sign or symptom Pt will exercise on his own and gain strength. Will continue to monitor pt and progress work loads as tolerated without sign or symptom               Discharge Exercise Prescription (Final Exercise Prescription Changes):  Exercise Prescription  Changes - 10/11/22 0835       Response to Exercise   Blood Pressure (Admit) 100/62    Blood Pressure (Exercise) 98/62    Blood Pressure (Exit) 98/60    Heart Rate (Admit) 60 bpm    Heart Rate (Exercise) 108 bpm    Heart Rate (Exit) 67 bpm    Rating of Perceived Exertion (Exercise) 13    Perceived Dyspnea (Exercise) 0    Symptoms none    Comments REVD MET's, goals and home EXrx    Duration Progress to 30 minutes of  aerobic without signs/symptoms of physical distress    Intensity THRR unchanged      Progression   Progression Continue to progress workloads to maintain intensity without signs/symptoms of physical distress.    Average METs 5.13      Resistance Training   Training Prescription No      Recumbant Elliptical   Level 2    RPM 74    Watts 102    Minutes 15    METs 6.3  Track   Laps 24    Minutes 15    METs 4.06      Home Exercise Plan   Plans to continue exercise at Home (comment)    Frequency Add 3 additional days to program exercise sessions.    Initial Home Exercises Provided 10/11/22             Nutrition:  Target Goals: Understanding of nutrition guidelines, daily intake of sodium 1500mg , cholesterol 200mg , calories 30% from fat and 7% or less from saturated fats, daily to have 5 or more servings of fruits and vegetables.  Biometrics:  Pre Biometrics - 09/21/22 0906       Pre Biometrics   Waist Circumference 32 inches    Hip Circumference 37 inches    Waist to Hip Ratio 0.86 %    Triceps Skinfold 9 mm    % Body Fat 19.9 %    Grip Strength 21 kg    Flexibility 0 in   knees bent   Single Leg Stand 30 seconds              Nutrition Therapy Plan and Nutrition Goals:  Nutrition Therapy & Goals - 09/29/22 1043       Nutrition Therapy   Diet Heart Healthy Diet    Drug/Food Interactions Statins/Certain Fruits      Personal Nutrition Goals   Nutrition Goal Patient to identify strategies for reducing cardiovascular risk by  attending the [facility] education and nutrition series weekly.    Personal Goal #2 Patient to improve diet quality by using the plate method as a guide for meal planning to include lean protein/plant protein, fruits, vegetables, whole grains, nonfat dairy as part of a well-balanced diet.    Comments Patient will benefit from participation in intensive cardiac rehab for nutrition, exercise, and lifestyle modification.      Intervention Plan   Intervention Prescribe, educate and counsel regarding individualized specific dietary modifications aiming towards targeted core components such as weight, hypertension, lipid management, diabetes, heart failure and other comorbidities.;Nutrition handout(s) given to patient.    Expected Outcomes Short Term Goal: Understand basic principles of dietary content, such as calories, fat, sodium, cholesterol and nutrients.;Long Term Goal: Adherence to prescribed nutrition plan.             Nutrition Assessments:  Nutrition Assessments - 09/30/22 0921       Rate Your Plate Scores   Pre Score 70            MEDIFICTS Score Key: ?70 Need to make dietary changes  40-70 Heart Healthy Diet ? 40 Therapeutic Level Cholesterol Diet   Flowsheet Row INTENSIVE CARDIAC REHAB from 09/29/2022 in [facility] Heart, Vascular, & Lung Health  Picture Your Plate Total Score on Admission 70      Picture Your Plate Scores: <16 Unhealthy dietary pattern with much room for improvement. 41-50 Dietary pattern unlikely to meet recommendations for good health and room for improvement. 51-60 More healthful dietary pattern, with some room for improvement.  >60 Healthy dietary pattern, although there may be some specific behaviors that could be improved.    Nutrition Goals Re-Evaluation:  Nutrition Goals Re-Evaluation     Row Name 09/29/22 1043             Goals   Current Weight 138 lb 0.1 oz (62.6 kg)       Comment Lipids WNL- LDL 85, A1c  WNL, Cr 1.36, GFR 54  Expected Outcome Patient will benefit from participation in intensive cardiac rehab for nutrition, exercise, and lifestyle modification.                Nutrition Goals Re-Evaluation:  Nutrition Goals Re-Evaluation     Row Name 09/29/22 1043             Goals   Current Weight 138 lb 0.1 oz (62.6 kg)       Comment Lipids WNL- LDL 85, A1c WNL, Cr 1.36, GFR 54       Expected Outcome Patient will benefit from participation in intensive cardiac rehab for nutrition, exercise, and lifestyle modification.                Nutrition Goals Discharge (Final Nutrition Goals Re-Evaluation):  Nutrition Goals Re-Evaluation - 09/29/22 1043       Goals   Current Weight 138 lb 0.1 oz (62.6 kg)    Comment Lipids WNL- LDL 85, A1c WNL, Cr 1.36, GFR 54    Expected Outcome Patient will benefit from participation in intensive cardiac rehab for nutrition, exercise, and lifestyle modification.             Psychosocial: Target Goals: Acknowledge presence or absence of significant depression and/or stress, maximize coping skills, provide positive support system. Participant is able to verbalize types and ability to use techniques and skills needed for reducing stress and depression.  Initial Review & Psychosocial Screening:  Initial Psych Review & Screening - 09/21/22 1051       Initial Review   Current issues with None Identified      Family Dynamics   Good Support System? Yes   Jeremy Waters has his wife, daughter and grandchildren for support.     Barriers   Psychosocial barriers to participate in program There are no identifiable barriers or psychosocial needs.      Screening Interventions   Interventions Encouraged to exercise    Expected Outcomes Short Term goal: Identification and review with participant of any Quality of Life or Depression concerns found by scoring the questionnaire.             Quality of Life Scores:  Quality of Life - 09/21/22 1138        Quality of Life   Select Quality of Life      Quality of Life Scores   Health/Function Pre 13 %    Socioeconomic Pre 23 %    Psych/Spiritual Pre 21.86 %    Family Pre 30 %    GLOBAL Pre 19.49 %            Scores of 19 and below usually indicate a poorer quality of life in these areas.  A difference of  2-3 points is a clinically meaningful difference.  A difference of 2-3 points in the total score of the Quality of Life Index has been associated with significant improvement in overall quality of life, self-image, physical symptoms, and general health in studies assessing change in quality of life.  PHQ-9: Review Flowsheet  More data exists      09/21/2022 09/06/2022 07/29/2022 03/18/2022 02/23/2021  Depression screen PHQ 2/9  Decreased Interest 0 0 0 0 0  Down, Depressed, Hopeless 0 0 0 0 0  PHQ - 2 Score 0 0 0 0 0  Altered sleeping 3 - 2 - -  Tired, decreased energy 1 - 1 - -  Change in appetite 0 - 0 - -  Feeling bad or failure about yourself  0 -  0 - -  Trouble concentrating 0 - 1 - -  Moving slowly or fidgety/restless 0 - 0 - -  Suicidal thoughts 0 - 0 - -  PHQ-9 Score 4 - 4 - -  Difficult doing work/chores Not difficult at all - Not difficult at all - -   Interpretation of Total Score  Total Score Depression Severity:  1-4 = Minimal depression, 5-9 = Mild depression, 10-14 = Moderate depression, 15-19 = Moderately severe depression, 20-27 = Severe depression   Psychosocial Evaluation and Intervention:   Psychosocial Re-Evaluation:  Psychosocial Re-Evaluation     Row Name 09/29/22 1457 10/22/22 0839           Psychosocial Re-Evaluation   Current issues with None Identified None Identified      Comments Denies being depressed but endorses feling tired and having low energy will review quality of life in the upcming week Denies being depressed but endorses feling tired and having low energy will review quality of life in the upcming week      Interventions  Encouraged to attend Cardiac Rehabilitation for the exercise;Relaxation education;Stress management education Encouraged to attend Cardiac Rehabilitation for the exercise;Relaxation education;Stress management education      Continue Psychosocial Services  Follow up required by staff Follow up required by staff               Psychosocial Discharge (Final Psychosocial Re-Evaluation):  Psychosocial Re-Evaluation - 10/22/22 0839       Psychosocial Re-Evaluation   Current issues with None Identified    Comments Denies being depressed but endorses feling tired and having low energy will review quality of life in the upcming week    Interventions Encouraged to attend Cardiac Rehabilitation for the exercise;Relaxation education;Stress management education    Continue Psychosocial Services  Follow up required by staff             Vocational Rehabilitation: Provide vocational rehab assistance to qualifying candidates.   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 Participant will state understanding/return demonstration of topics presented.    Education     Row Name 09/29/22 1000     Education   Cardiac Education Topics Pritikin   Orthoptist   Educator Dietitian   Weekly Topic International Cuisine- Spotlight on the [facility] Zones   Instruction Review Code 1- Verbalizes Understanding   Class Start Time [phone]   Class Stop Time [phone]   Class Time Calculation (min) 32 min    Row Name 10/01/22 0900     Education   Cardiac Education Topics Pritikin   Select Core Videos     Core Videos   Educator  Exercise Physiologist   Select Exercise Education   Exercise Education Improving Performance   Instruction Review Code 1- Verbalizes Understanding   Class Start Time [phone]   Class Stop Time 0855   Class Time Calculation (min) 43 min    Row Name 10/06/22 0800     Education   Cardiac Education Topics [facility]rit[facility]in   [employer]  Educator Dietitian   Weekly Topic Simple Sides and Sauces   Instruction Review Code 1- Verbalizes Understanding   Class Start Time 0815   Class Stop Time 0850   Class Time Calculation (min) 35 min    Row Name 10/08/22 0900     Education   Cardiac Education Topics Pritikin   Select Core Videos     Core Videos   Educator Exercise Physiologist   Select Psychosocial   Psychosocial How Our Thoughts Can Heal Our Hearts   Instruction Review Code 1- Verbalizes Understanding   Class Start Time 0815   Class Stop Time 0855   Class Time Calculation (min) 40 min    Row Name 10/11/22 1000     Education   Cardiac Education Topics Pritikin   Geographical information systems officer Psychosocial   Psychosocial Workshop From Head to Heart: The Power of a Healthy Outlook   Instruction Review Code 1- Verbalizes Understanding   Class Start Time 0815   Class Stop Time 0903   Class Time Calculation (min) 48 min    Row Name 10/13/22 0900     Education   Cardiac Education Topics Pritikin   Secondary school teacher School   Educator Dietitian   Weekly Topic Powerhouse Plant-Based Proteins   Instruction Review Code 1- Verbalizes Understanding   Class Start Time [phone]   Class Stop Time 0845   Class Time Calculation (min) 35 min    Row Name 10/15/22 0900     Education   Cardiac Education Topics Pritikin   Select Core Videos     Core Videos   Educator Exercise Physiologist   Select General Education   General Education Hypertension and Heart Disease   Instruction Review Code 1-  Verbalizes Understanding   Class Start Time [phone]   Class Stop Time 0843   Class Time Calculation (min) 31 min    Row Name 10/22/22 1400     Education   Cardiac Education Topics Pritikin   Hospital doctor Education   General Education Heart Disease Risk Reduction   Instruction Review Code 1- Verbalizes Understanding   Class Start Time 0810   Class Stop Time 0849   Class Time Calculation (min) 39 min            Core Videos: Exercise    Move It!  Clinical staff conducted group or individual video education with verbal and written material and guidebook.  Patient learns the recommended Pritikin exercise program. Exercise with the goal of living a long, healthy life. Some of the health benefits of exercise include controlled diabetes, healthier blood pressure levels, improved cholesterol levels, improved heart and lung capacity, improved sleep, and better body composition. Everyone should speak with their doctor before starting or changing an exercise routine.  Biomechanical Limitations Clinical staff conducted group or individual video education with verbal and written material and guidebook.  Patient learns how biomechanical limitations can impact exercise and how we can mitigate and possibly overcome limitations to have an impactful and balanced exercise routine.  Body Composition Clinical staff conducted group or individual video education with verbal and written material and guidebook.  Patient learns that body composition (ratio of muscle mass to fat mass) is a key component to assessing overall fitness, rather than body weight alone. Increased fat mass, especially visceral belly fat,  can put [place] at increased risk for metabolic syndrome, type 2 diabetes, heart disease, and even death. It is recommended to combine diet and exercise (cardiovascular and resistance training) to improve your body composition. Seek  guidance from your physician and exercise physiologist before implementing an exercise routine.  Exercise Action Plan Clinical staff conducted group or individual video education with verbal and written material and guidebook.  Patient learns the recommended strategies to achieve and enjoy long-term exercise adherence, including variety, self-motivation, self-efficacy, and positive decision making. Benefits of exercise include fitness, good health, weight management, more energy, better sleep, less stress, and overall well-being.  Medical   Heart Disease Risk Reduction Clinical staff conducted group or individual video education with verbal and written material and guidebook.  Patient learns our heart is our most vital organ as it circulates oxygen, nutrients, white blood cells, and hormones throughout the entire body, and carries waste away. Data supports a plant-based eating plan like the Pritikin Program for its effectiveness in slowing progression of and reversing heart disease. The video provides a number of recommendations to address heart disease.   Metabolic Syndrome and Belly Fat  Clinical staff conducted group or individual video education with verbal and written material and guidebook.  Patient learns what metabolic syndrome is, how it leads to heart disease, and how one can reverse it and keep it from coming back. You have metabolic syndrome if you have 3 of the following 5 criteria: abdominal obesity, high blood pressure, high triglycerides, low HDL cholesterol, and high blood sugar.  Hypertension and Heart Disease Clinical staff conducted group or individual video education with verbal and written material and guidebook.  Patient learns that high blood pressure, or hypertension, is very common in the [place]. Hypertension is largely due to excessive salt intake, but other important risk factors include being overweight, physical inactivity, drinking too much alcohol, smoking, and  not eating enough potassium from fruits and vegetables. High blood pressure is a leading risk factor for heart attack, stroke, congestive heart failure, dementia, kidney failure, and premature death. Long-term effects of excessive salt intake include stiffening of the arteries and thickening of heart muscle and organ damage. Recommendations include ways to reduce hypertension and the risk of heart disease.  Diseases of Our Time - Focusing on Diabetes Clinical staff conducted group or individual video education with verbal and written material and guidebook.  Patient learns why the best way to stop diseases of our time is prevention, through food and other lifestyle changes. Medicine (such as prescription pills and surgeries) is often only a Band-Aid on the problem, not a long-term solution. Most common diseases of our time include obesity, type 2 diabetes, hypertension, heart disease, and cancer. The Pritikin Program is recommended and has been proven to help reduce, reverse, and/or prevent the damaging effects of metabolic syndrome.  Nutrition   Overview of the Pritikin Eating Plan  Clinical staff conducted group or individual video education with verbal and written material and guidebook.  Patient learns about the Pritikin Eating Plan for disease risk reduction. The Pritikin Eating Plan emphasizes a wide variety of unrefined, minimally-processed carbohydrates, like fruits, vegetables, whole grains, and legumes. Go, Caution, and Stop food choices are explained. Plant-based and lean animal proteins are emphasized. Rationale provided for low sodium intake for blood pressure control, low added sugars for blood sugar stabilization, and low added fats and oils for coronary artery disease risk reduction and weight management.  Calorie Density  Clinical staff conducted group or individual  video education with verbal and written material and guidebook.  Patient learns about calorie density and how it impacts  the Pritikin Eating Plan. Knowing the characteristics of the food you choose will help you decide whether those foods will lead to weight gain or weight loss, and whether you want to consume more or less of them. Weight loss is usually a side effect of the Pritikin Eating Plan because of its focus on low calorie-dense foods.  Label Reading  Clinical staff conducted group or individual video education with verbal and written material and guidebook.  Patient learns about the Pritikin recommended label reading guidelines and corresponding recommendations regarding calorie density, added sugars, sodium content, and whole grains.  Dining Out - Part 1  Clinical staff conducted group or individual video education with verbal and written material and guidebook.  Patient learns that restaurant meals can be sabotaging because they can be so high in calories, fat, sodium, and/or sugar. Patient learns recommended strategies on how to positively address this and avoid unhealthy pitfalls.  Facts on Fats  Clinical staff conducted group or individual video education with verbal and written material and guidebook.  Patient learns that lifestyle modifications can be just as effective, if not more so, as many medications for lowering your risk of heart disease. A Pritikin lifestyle can help to reduce your risk of inflammation and atherosclerosis (cholesterol build-up, or plaque, in the artery walls). Lifestyle interventions such as dietary choices and physical activity address the cause of atherosclerosis. A review of the types of fats and their impact on blood cholesterol levels, along with dietary recommendations to reduce fat intake is also included.  Nutrition Action Plan  Clinical staff conducted group or individual video education with verbal and written material and guidebook.  Patient learns how to incorporate Pritikin recommendations into their lifestyle. Recommendations include planning and keeping personal  health goals in mind as an important part of their success.  Healthy Mind-Set    Healthy Minds, Bodies, Hearts  Clinical staff conducted group or individual video education with verbal and written material and guidebook.  Patient learns how to identify when they are stressed. Video will discuss the impact of that stress, as well as the many benefits of stress management. Patient will also be introduced to stress management techniques. The way we think, act, and feel has an impact on our hearts.  How Our Thoughts Can Heal Our Hearts  Clinical staff conducted group or individual video education with verbal and written material and guidebook.  Patient learns that negative thoughts can cause depression and anxiety. This can result in negative lifestyle behavior and serious health problems. Cognitive behavioral therapy is an effective method to help control our thoughts in order to change and improve our emotional outlook.  Additional Videos:  Exercise    Improving Performance  Clinical staff conducted group or individual video education with verbal and written material and guidebook.  Patient learns to use a non-linear approach by alternating intensity levels and lengths of time spent exercising to help burn more calories and lose more body fat. Cardiovascular exercise helps improve heart health, metabolism, hormonal balance, blood sugar control, and recovery from fatigue. Resistance training improves strength, endurance, balance, coordination, reaction time, metabolism, and muscle mass. Flexibility exercise improves circulation, posture, and balance. Seek guidance from your physician and exercise physiologist before implementing an exercise routine and learn your capabilities and proper form for all exercise.  Introduction to Yoga  Clinical staff conducted group or individual video education with  verbal and written material and guidebook.  Patient learns about yoga, a discipline of the coming  together of mind, breath, and body. The benefits of yoga include improved flexibility, improved range of motion, better posture and core strength, increased lung function, weight loss, and positive self-image. Yoga's heart health benefits include lowered blood pressure, healthier heart rate, decreased cholesterol and triglyceride levels, improved immune function, and reduced stress. Seek guidance from your physician and exercise physiologist before implementing an exercise routine and learn your capabilities and proper form for all exercise.  Medical   Aging: Enhancing Your Quality of Life  Clinical staff conducted group or individual video education with verbal and written material and guidebook.  Patient learns key strategies and recommendations to stay in good physical health and enhance quality of life, such as prevention strategies, having an advocate, securing a Health Care Proxy and Power of Attorney, and keeping a list of medications and system for tracking them. It also discusses how to avoid risk for bone loss.  Biology of Weight Control  Clinical staff conducted group or individual video education with verbal and written material and guidebook.  Patient learns that weight gain occurs because we consume more calories than we burn (eating more, moving less). Even if your body weight is normal, you may have higher ratios of fat compared to muscle mass. Too much body fat puts you at increased risk for cardiovascular disease, heart attack, stroke, type 2 diabetes, and obesity-related cancers. In addition to exercise, following the Pritikin Eating Plan can help reduce your risk.  Decoding Lab Results  Clinical staff conducted group or individual video education with verbal and written material and guidebook.  Patient learns that lab test reflects one measurement whose values change over time and are influenced by many factors, including medication, stress, sleep, exercise, food, hydration,  pre-existing medical conditions, and more. It is recommended to use the knowledge from this video to become more involved with your lab results and evaluate your numbers to speak with your doctor.   Diseases of Our Time - Overview  Clinical staff conducted group or individual video education with verbal and written material and guidebook.  Patient learns that according to the CDC, 50% to 70% of chronic diseases (such as obesity, type 2 diabetes, elevated lipids, hypertension, and heart disease) are avoidable through lifestyle improvements including healthier food choices, listening to satiety cues, and increased physical activity.  Sleep Disorders Clinical staff conducted group or individual video education with verbal and written material and guidebook.  Patient learns how good quality and duration of sleep are important to overall health and well-being. Patient also learns about sleep disorders and how they impact health along with recommendations to address them, including discussing with a physician.  Nutrition  Dining Out - Part 2 Clinical staff conducted group or individual video education with verbal and written material and guidebook.  Patient learns how to plan ahead and communicate in order to maximize their dining experience in a healthy and nutritious manner. Included are recommended food choices based on the type of restaurant the patient is visiting.   Fueling a Banker conducted group or individual video education with verbal and written material and guidebook.  There is a strong connection between our food choices and our health. Diseases like obesity and type 2 diabetes are very prevalent and are in large-part due to lifestyle choices. The Pritikin Eating Plan provides plenty of food and hunger-curbing satisfaction. It is easy to follow, affordable,  and helps reduce health risks.  Menu Workshop  Clinical staff conducted group or individual video education  with verbal and written material and guidebook.  Patient learns that restaurant meals can sabotage health goals because they are often packed with calories, fat, sodium, and sugar. Recommendations include strategies to plan ahead and to communicate with the manager, chef, or server to help order a healthier meal.  Planning Your Eating Strategy  Clinical staff conducted group or individual video education with verbal and written material and guidebook.  Patient learns about the Pritikin Eating Plan and its benefit of reducing the risk of disease. The Pritikin Eating Plan does not focus on calories. Instead, it emphasizes high-quality, nutrient-rich foods. By knowing the characteristics of the foods, we choose, we can determine their calorie density and make informed decisions.  Targeting Your Nutrition Priorities  Clinical staff conducted group or individual video education with verbal and written material and guidebook.  Patient learns that lifestyle habits have a tremendous impact on disease risk and progression. This video provides eating and physical activity recommendations based on your personal health goals, such as reducing LDL cholesterol, losing weight, preventing or controlling type 2 diabetes, and reducing high blood pressure.  Vitamins and Minerals  Clinical staff conducted group or individual video education with verbal and written material and guidebook.  Patient learns different ways to obtain key vitamins and minerals, including through a recommended healthy diet. It is important to discuss all supplements you take with your doctor.   Healthy Mind-Set    Smoking Cessation  Clinical staff conducted group or individual video education with verbal and written material and guidebook.  Patient learns that cigarette smoking and tobacco addiction pose a serious health risk which affects millions of people. Stopping smoking will significantly reduce the risk of heart disease, lung disease,  and many forms of cancer. Recommended strategies for quitting are covered, including working with your doctor to develop a successful plan.  Culinary   Becoming a Set designer conducted group or individual video education with verbal and written material and guidebook.  Patient learns that cooking at home can be healthy, cost-effective, quick, and puts them in control. Keys to cooking healthy recipes will include looking at your recipe, assessing your equipment needs, planning ahead, making it simple, choosing cost-effective seasonal ingredients, and limiting the use of added fats, salts, and sugars.  Cooking - Breakfast and Snacks  Clinical staff conducted group or individual video education with verbal and written material and guidebook.  Patient learns how important breakfast is to satiety and nutrition through the entire day. Recommendations include key foods to eat during breakfast to help stabilize blood sugar levels and to prevent overeating at meals later in the day. Planning ahead is also a key component.  Cooking - Educational psychologist conducted group or individual video education with verbal and written material and guidebook.  Patient learns eating strategies to improve overall health, including an approach to cook more at home. Recommendations include thinking of animal protein as a side on your plate rather than center stage and focusing instead on lower calorie dense options like vegetables, fruits, whole grains, and plant-based proteins, such as beans. Making sauces in large quantities to freeze for later and leaving the skin on your vegetables are also recommended to maximize your experience.  Cooking - Healthy Salads and Dressing Clinical staff conducted group or individual video education with verbal and written material and guidebook.  Patient learns that  vegetables, fruits, whole grains, and legumes are the foundations of the Pritikin Eating Plan.  Recommendations include how to incorporate each of these in flavorful and healthy salads, and how to create homemade salad dressings. Proper handling of ingredients is also covered. Cooking - Soups and [employer] - Soups and Desserts Clinical staff conducted group or individual video education with verbal and written material and guidebook.  Patient learns that Pritikin soups and desserts make for easy, nutritious, and delicious snacks and meal components that are low in sodium, fat, sugar, and calorie density, while high in vitamins, minerals, and filling fiber. Recommendations include simple and healthy ideas for soups and desserts.   Overview     The Pritikin Solution Program Overview Clinical staff conducted group or individual video education with verbal and written material and guidebook.  Patient learns that the results of the Pritikin Program have been documented in more than 100 articles published in peer-reviewed journals, and the benefits include reducing risk factors for (and, in some cases, even reversing) high cholesterol, high blood pressure, type 2 diabetes, obesity, and more! An overview of the three key pillars of the Pritikin Program will be covered: eating well, doing regular exercise, and having a healthy mind-set.  WORKSHOPS  Exercise: Exercise Basics: Building Your Action Plan Clinical staff led group instruction and group discussion with PowerPoint presentation and patient guidebook. To enhance the learning environment the use of posters, models and videos may be added. At the conclusion of this workshop, patients will comprehend the difference between physical activity and exercise, as well as the benefits of incorporating both, into their routine. Patients will understand the FITT (Frequency, Intensity, Time, and Type) principle and how to use it to build an exercise action plan. In addition, safety concerns and other considerations for exercise and cardiac rehab  will be addressed by the presenter. The purpose of this lesson is to promote a comprehensive and effective weekly exercise routine in order to improve patients' overall level of fitness.   Managing Heart Disease: Your Path to a Healthier Heart Clinical staff led group instruction and group discussion with PowerPoint presentation and patient guidebook. To enhance the learning environment the use of posters, models and videos may be added.At the conclusion of this workshop, patients will understand the anatomy and physiology of the heart. Additionally, they will understand how Pritikin's three pillars impact the risk factors, the progression, and the management of heart disease.  The purpose of this lesson is to provide a high-level overview of the heart, heart disease, and how the Pritikin lifestyle positively impacts risk factors.  Exercise Biomechanics Clinical staff led group instruction and group discussion with PowerPoint presentation and patient guidebook. To enhance the learning environment the use of posters, models and videos may be added. Patients will learn how the structural parts of their bodies function and how these functions impact their daily activities, movement, and exercise. Patients will learn how to promote a neutral spine, learn how to manage pain, and identify ways to improve their physical movement in order to promote healthy living. The purpose of this lesson is to expose patients to common physical limitations that impact physical activity. Participants will learn practical ways to adapt and manage aches and pains, and to minimize their effect on regular exercise. Patients will learn how to maintain good posture while sitting, walking, and lifting.  Balance Training and Fall Prevention  Clinical staff led group instruction and group discussion with PowerPoint presentation and patient guidebook. To enhance the  learning environment the use of posters, models and videos  may be added. At the conclusion of this workshop, patients will understand the importance of their sensorimotor skills (vision, proprioception, and the vestibular system) in maintaining their ability to balance as they age. Patients will apply a variety of balancing exercises that are appropriate for their current level of function. Patients will understand the common causes for poor balance, possible solutions to these problems, and ways to modify their physical environment in order to minimize their fall risk. The purpose of this lesson is to teach patients about the importance of maintaining balance as they age and ways to minimize their risk of falling.  WORKSHOPS   Nutrition:  Fueling a Ship broker led group instruction and group discussion with PowerPoint presentation and patient guidebook. To enhance the learning environment the use of posters, models and videos may be added. Patients will review the foundational principles of the Pritikin Eating Plan and understand what constitutes a serving size in each of the food groups. Patients will also learn Pritikin-friendly foods that are better choices when away from home and review make-ahead meal and snack options. Calorie density will be reviewed and applied to three nutrition priorities: weight maintenance, weight loss, and weight gain. The purpose of this lesson is to reinforce (in a group setting) the key concepts around what patients are recommended to eat and how to apply these guidelines when away from home by planning and selecting Pritikin-friendly options. Patients will understand how calorie density may be adjusted for different weight management goals.  Mindful Eating  Clinical staff led group instruction and group discussion with PowerPoint presentation and patient guidebook. To enhance the learning environment the use of posters, models and videos may be added. Patients will briefly review the concepts of the Pritikin  Eating Plan and the importance of low-calorie dense foods. The concept of mindful eating will be introduced as well as the importance of paying attention to internal hunger signals. Triggers for non-hunger eating and techniques for dealing with triggers will be explored. The purpose of this lesson is to provide patients with the opportunity to review the basic principles of the Pritikin Eating Plan, discuss the value of eating mindfully and how to measure internal cues of hunger and fullness using the Hunger Scale. Patients will also discuss reasons for non-hunger eating and learn strategies to use for controlling emotional eating.  Targeting Your Nutrition Priorities Clinical staff led group instruction and group discussion with PowerPoint presentation and patient guidebook. To enhance the learning environment the use of posters, models and videos may be added. Patients will learn how to determine their genetic susceptibility to disease by reviewing their family history. Patients will gain insight into the importance of diet as part of an overall healthy lifestyle in mitigating the impact of genetics and other environmental insults. The purpose of this lesson is to provide patients with the opportunity to assess their personal nutrition priorities by looking at their family history, their own health history and current risk factors. Patients will also be able to discuss ways of prioritizing and modifying the Pritikin Eating Plan for their highest risk areas  Menu  Clinical staff led group instruction and group discussion with PowerPoint presentation and patient guidebook. To enhance the learning environment the use of posters, models and videos may be added. Using menus brought in from [employer], or printed from [employer], patients will apply the Pritikin dining out guidelines that were presented in the [employer]  Out educational video. Patients will also be able to practice these guidelines  in a variety of provided scenarios. The purpose of this lesson is to provide patients with the opportunity to practice hands-on learning of the Pritikin Dining Out guidelines with actual menus and practice scenarios.  Label Reading Clinical staff led group instruction and group discussion with PowerPoint presentation and patient guidebook. To enhance the learning environment the use of posters, models and videos may be added. Patients will review and discuss the Pritikin label reading guidelines presented in Pritikin's Label Reading Educational series video. Using fool labels brought in from local grocery stores and markets, patients will apply the label reading guidelines and determine if the packaged food meet the Pritikin guidelines. The purpose of this lesson is to provide patients with the opportunity to review, discuss, and practice hands-on learning of the Pritikin Label Reading guidelines with actual packaged food labels. Cooking School  Pritikin's [employer] are designed to teach patients ways to prepare quick, simple, and affordable recipes at home. The importance of nutrition's role in chronic disease risk reduction is reflected in its emphasis in the overall Pritikin program. By learning how to prepare essential core Pritikin Eating Plan recipes, patients will increase control over what they eat; be able to customize the flavor of foods without the use of added salt, sugar, or fat; and improve the quality of the food they consume. By learning a set of core recipes which are easily assembled, quickly prepared, and affordable, patients are more likely to prepare more healthy foods at home. These workshops focus on convenient breakfasts, simple entres, side dishes, and desserts which can be prepared with minimal effort and are consistent with nutrition recommendations for cardiovascular risk reduction. Cooking [employer] are taught by a Armed forces logistics/support/administrative officer (RD) who has  been trained by the [employer]. The chef or RD has a clear understanding of the importance of minimizing - if not completely eliminating - added fat, sugar, and sodium in recipes. Throughout the series of Cooking School Workshop sessions, patients will learn about healthy ingredients and efficient methods of cooking to build confidence in their capability to prepare    Cooking School weekly topics:  Adding Flavor- Sodium-Free  Fast and Healthy Breakfasts  Powerhouse Plant-Based Proteins  Satisfying Salads and Dressings  Simple Sides and Sauces  International Cuisine-Spotlight on the [employer] Zones  Delicious Desserts  Savory Soups  [employer] - Meals in a Astronomer Appetizers and Snacks  Comforting Weekend Breakfasts  One-Pot Wonders   Fast Evening Meals  Landscape architect Your Pritikin Plate  WORKSHOPS   Healthy Mindset (Psychosocial):  Focused Goals, Sustainable Changes Clinical staff led group instruction and group discussion with PowerPoint presentation and patient guidebook. To enhance the learning environment the use of posters, models and videos may be added. Patients will be able to apply effective goal setting strategies to establish at least one personal goal, and then take consistent, meaningful action toward that goal. They will learn to identify common barriers to achieving personal goals and develop strategies to overcome them. Patients will also gain an understanding of how our mind-set can impact our ability to achieve goals and the importance of cultivating a positive and growth-oriented mind-set. The purpose of this lesson is to provide patients with a deeper understanding of how to set and achieve personal goals, as well as the tools and strategies needed to overcome common obstacles which may arise along the way.  From Head to Heart: The Power of a Healthy Outlook  Clinical staff led group instruction and group discussion with  PowerPoint presentation and patient guidebook. To enhance the learning environment the use of posters, models and videos may be added. Patients will be able to recognize and describe the impact of emotions and mood on physical health. They will discover the importance of self-care and explore self-care practices which may work for them. Patients will also learn how to utilize the 4 C's to cultivate a healthier outlook and better manage stress and challenges. The purpose of this lesson is to demonstrate to patients how a healthy outlook is an essential part of maintaining good health, especially as they continue their cardiac rehab journey.  Healthy Sleep for a Healthy Heart Clinical staff led group instruction and group discussion with PowerPoint presentation and patient guidebook. To enhance the learning environment the use of posters, models and videos may be added. At the conclusion of this workshop, patients will be able to demonstrate knowledge of the importance of sleep to overall health, well-being, and quality of life. They will understand the symptoms of, and treatments for, common sleep disorders. Patients will also be able to identify daytime and nighttime behaviors which impact sleep, and they will be able to apply these tools to help manage sleep-related challenges. The purpose of this lesson is to provide patients with a general overview of sleep and outline the importance of quality sleep. Patients will learn about a few of the most common sleep disorders. Patients will also be introduced to the concept of "sleep hygiene," and discover ways to self-manage certain sleeping problems through simple daily behavior changes. Finally, the workshop will motivate patients by clarifying the links between quality sleep and their goals of heart-healthy living.   Recognizing and Reducing Stress Clinical staff led group instruction and group discussion with PowerPoint presentation and patient guidebook. To  enhance the learning environment the use of posters, models and videos may be added. At the conclusion of this workshop, patients will be able to understand the types of stress reactions, differentiate between acute and chronic stress, and recognize the impact that chronic stress has on their health. They will also be able to apply different coping mechanisms, such as reframing negative self-talk. Patients will have the opportunity to practice a variety of stress management techniques, such as deep abdominal breathing, progressive muscle relaxation, and/or guided imagery.  The purpose of this lesson is to educate patients on the role of stress in their lives and to provide healthy techniques for coping with it.  Learning Barriers/Preferences:  Learning Barriers/Preferences - 09/21/22 1202       Learning Barriers/Preferences   Learning Barriers Hearing   wears bilateral hearing aides   Learning Preferences Skilled Demonstration             Education Topics:  Knowledge Questionnaire Score:  Knowledge Questionnaire Score - 09/21/22 1139       Knowledge Questionnaire Score   Pre Score 19/24             Core Components/Risk Factors/Patient Goals at Admission:  Personal Goals and Risk Factors at Admission - 09/21/22 0947       Core Components/Risk Factors/Patient Goals on Admission   Hypertension --    Intervention --    Expected Outcomes --    Lipids Yes    Intervention Provide education and support for participant on nutrition & aerobic/resistive exercise along with prescribed medications to achieve LDL 70mg , HDL >40mg .  Expected Outcomes Short Term: Participant states understanding of desired cholesterol values and is compliant with medications prescribed. Participant is following exercise prescription and nutrition guidelines.;Long Term: Cholesterol controlled with medications as prescribed, with individualized exercise RX and with personalized nutrition plan. Value goals:  LDL < 70mg , HDL > 40 mg.    Personal Goal Other Yes    Personal Goal Be able to walk 6 miles/day on the golf course. Resume exercising 40 minutes on his elliptical machine at home. Resume core muscle exercises.    Intervention Develop individualized exercise action plan including aerobic, resistance, and stretching exercise to help build strength and stamina.    Expected Outcomes Patient will be able to resume home exercise routine on his elliptical machine. Patient will be able to walk the golf course. Patient will be able to resume core strength exercises once cleared by physician to do so.             Core Components/Risk Factors/Patient Goals Review:   Goals and Risk Factor Review     Row Name 09/29/22 1500 10/22/22 0839           Core Components/Risk Factors/Patient Goals Review   Personal Goals Review Weight Management/Obesity;Lipids Weight Management/Obesity;Lipids      Review Jeremy Waters started intensive cardiac rehab on 09/29/22 and did well with exercise, vital signs were stable. Systolic BP in the 90's-100's. Will continue to monitor bP Jeremy Waters is doing well with exercise at  intensive cardiac rehab. Vital signs have been  stable. Jeremy Waters feels good about his progress and has lost 2.6 kg since starting cardiac rehab      Expected Outcomes Jeremy Waters will continue to participate in intensive cardiac rehab for exercise, nutrition and lifestyle modifications Jeremy Waters will continue to participate in intensive cardiac rehab for exercise, nutrition and lifestyle modifications               Core Components/Risk Factors/Patient Goals at Discharge (Final Review):   Goals and Risk Factor Review - 10/22/22 0839       Core Components/Risk Factors/Patient Goals Review   Personal Goals Review Weight Management/Obesity;Lipids    Review Jeremy Waters is doing well with exercise at  intensive cardiac rehab. Vital signs have been  stable. Jeremy Waters feels good about his progress and has lost 2.6 kg since starting cardiac rehab     Expected Outcomes Jeremy Waters will continue to participate in intensive cardiac rehab for exercise, nutrition and lifestyle modifications             ITP Comments:  ITP Comments     Row Name 09/21/22 0906 09/29/22 1455 10/22/22 0838       ITP Comments Medical Director- Dr. Armanda Magic, MD. Intrduction to 30 Day ITP Review. Jeremy Waters started intensive cardiac rehab on 09/29/22 and did well with exercise 30 Day ITP Review. Jeremy Waters has good attendance and participation in  intensive cardiac rehab              Comments: See ITP comments.Thayer Headings RN BSN

## 2022-10-26 NOTE — Progress Notes (Deleted)
    Subjective:    Patient ID: Jeremy Waters, male    DOB: 1945-06-04, 77 y.o.   MRN: [id number]      HPI Emzie is here for No chief complaint on file.   Constipation -     Medications and allergies reviewed with patient and updated if appropriate.  Current Outpatient Medications on File Prior to Visit  Medication Sig Dispense Refill   aspirin EC 81 MG tablet Take 1 tablet (81 mg total) by mouth daily.     beta carotene 41324 UNIT capsule Take 25,000 Units by mouth daily.     clopidogrel (PLAVIX) 75 MG tablet Take 1 tablet (75 mg total) by mouth daily. 30 tablet 11   fluticasone (FLONASE) 50 MCG/ACT nasal spray SPRAY ONE SPRAY IN EACH NOSTRIL ONCE DAILY (Patient taking differently: Place 1 spray into both nostrils daily.) 16 mL 3   furosemide (LASIX) 20 MG tablet Take 1 tablet (20 mg total) by mouth daily. X 3 days, then use daily as needed for weight gain of 3-5 lbs in 24 hr time period (Patient not taking: Reported on 09/21/2022) 30 tablet 1   glycopyrrolate (ROBINUL) 2 MG tablet Take 1 tablet (2 mg total) by mouth every morning. 90 tablet 0   levothyroxine (SYNTHROID) 50 MCG tablet Take 1 tablet by mouth daily 90 tablet 2   methocarbamol (ROBAXIN) 500 MG tablet Take 500 mg by mouth every 6 (six) hours as needed for muscle spasms.     metoprolol succinate (TOPROL-XL) 25 MG 24 hr tablet Take 1 tablet (25 mg total) by mouth daily. 90 tablet 3   potassium chloride (KLOR-CON M) 10 MEQ tablet Take 1 tablet (10 mEq total) by mouth daily. X 3 days, then daily as needed on days you take Lasix (Patient not taking: Reported on 09/21/2022) 30 tablet 1   predniSONE (DELTASONE) 5 MG tablet 6 tablets for 4 days, then 4 tablets for 4 days then 2 tablets for 4 days     pyridOXINE (VITAMIN B-6) 100 MG tablet Take 100 mg by mouth daily.     rosuvastatin (CRESTOR) 40 MG tablet TAKE 1 TABLET BY MOUTH DAILY 90 tablet 1   vitamin C (ASCORBIC ACID) 500 MG tablet Take 500 mg by mouth 2 (two) times  daily.     vitamin E 400 UNIT capsule Take 400 Units by mouth daily.     No current facility-administered medications on file prior to visit.    Review of Systems     Objective:  There were no vitals filed for this visit. BP Readings from Last 3 Encounters:  10/02/22 120/76  09/30/22 (!) 86/54  09/26/22 134/71   Wt Readings from Last 3 Encounters:  10/01/22 134 lb 14.7 oz (61.2 kg)  09/30/22 135 lb (61.2 kg)  09/21/22 140 lb 14 oz (63.9 kg)   There is no height or weight on file to calculate BMI.    Physical Exam         Assessment & Plan:    See Problem List for Assessment and Plan of chronic medical problems.

## 2022-10-27 ENCOUNTER — Encounter (HOSPITAL_COMMUNITY)
Admission: RE | Admit: 2022-10-27 | Discharge: 2022-10-27 | Disposition: A | Discharge status: Home / Self Care | Payer: PPO | Source: Ambulatory Visit | Attending: Cardiology | Admitting: Cardiology

## 2022-10-27 ENCOUNTER — Encounter: Payer: Self-pay | Admitting: Internal Medicine

## 2022-10-27 ENCOUNTER — Encounter (HOSPITAL_COMMUNITY): Payer: PPO

## 2022-10-27 DIAGNOSIS — Z952 Presence of prosthetic heart valve: Secondary | ICD-10-CM

## 2022-10-27 DIAGNOSIS — I214 Non-ST elevation (NSTEMI) myocardial infarction: Secondary | ICD-10-CM | POA: Diagnosis not present

## 2022-10-27 DIAGNOSIS — Z951 Presence of aortocoronary bypass graft: Secondary | ICD-10-CM

## 2022-10-28 ENCOUNTER — Encounter: Payer: Self-pay | Admitting: Internal Medicine

## 2022-10-28 ENCOUNTER — Ambulatory Visit: Payer: PPO | Attending: Internal Medicine | Admitting: Internal Medicine

## 2022-10-28 VITALS — BP 108/72 | HR 77 | Ht 67.0 in | Wt 132.0 lb

## 2022-10-28 DIAGNOSIS — I480 Paroxysmal atrial fibrillation: Secondary | ICD-10-CM

## 2022-10-28 DIAGNOSIS — Z951 Presence of aortocoronary bypass graft: Secondary | ICD-10-CM

## 2022-10-28 DIAGNOSIS — E782 Mixed hyperlipidemia: Secondary | ICD-10-CM | POA: Diagnosis not present

## 2022-10-28 DIAGNOSIS — Z952 Presence of prosthetic heart valve: Secondary | ICD-10-CM

## 2022-10-28 LAB — LIPID PANEL
Chol/HDL Ratio: 2.5 ratio (ref 0.0–5.0)
Cholesterol, Total: 151 mg/dL (ref 100–199)
HDL: 61 mg/dL (ref >39)
LDL Chol Calc (NIH): 76 mg/dL (ref 0–99)
Triglycerides: 72 mg/dL (ref 0–149)
VLDL Cholesterol Cal: 14 mg/dL (ref 5–40)

## 2022-10-28 LAB — HEPATIC FUNCTION PANEL
ALT: 32 IU/L (ref 0–44)
AST: 32 IU/L (ref 0–40)
Albumin: 4.3 g/dL (ref 3.8–4.8)
Alkaline Phosphatase: 75 IU/L (ref 44–121)
Bilirubin Total: 0.5 mg/dL (ref 0.0–1.2)
Bilirubin, Direct: 0.2 mg/dL (ref 0.00–0.40)
Total Protein: 6.9 g/dL (ref 6.0–8.5)

## 2022-10-28 MED ORDER — APIXABAN 5 MG PO TABS: 5.0000 mg | ORAL_TABLET | Freq: Two times a day (BID) | ORAL | 3 refills | Status: DC

## 2022-10-28 NOTE — Patient Instructions (Addendum)
Medication Instructions:  STOP Aspirin  START Eliquis 5 mg twice daily.  *If you need a refill on your cardiac medications before your next appointment, please call your pharmacy*   Follow-Up: At [facility], you and your health needs are our priority.  As part of our continuing mission to provide you with exceptional heart care, we have created designated Provider Care Teams.  These Care Teams include your primary Cardiologist (physician) and Advanced Practice Providers (APPs -  Physician Assistants and Nurse Practitioners) who all work together to provide you with the care you need, when you need it.  We recommend signing up for the patient portal called "MyChart".  Sign up information is provided on this After Visit Summary.  MyChart is used to connect with patients for Virtual Visits (Telemedicine).  Patients are able to view lab/test results, encounter notes, upcoming appointments, etc.  Non-urgent messages can be sent to your provider as well.   To learn more about what you can do with MyChart, go to [email or web address].    Your next appointment:   6 month(s)  Provider:   Maisie Fus, MD

## 2022-10-28 NOTE — Progress Notes (Signed)
Cardiology Office Note:    Date:  10/28/2022   ID:  Jeremy Waters, DOB Oct 05, 1945, MRN [id number]  PCP:  Pincus Sanes, MD   [facility] Providers Cardiologist:  Maisie Fus, MD     Referring MD: Pincus Sanes, MD   No chief complaint on file. Ventricular Ectopy  History of Present Illness:    Jeremy Waters is a 77 y.o. male with a hx of smoking, prostate CA and prostatectomy TURP (no radiation), referred to cardiology for PVCs, recent COVID + early September.  He saw his PCP and was noted to have PVCs and bigeminy. He is asymptomatic. He notes some heart burn. He does 15 minutes to the elliptical. He denies dyspnea on exertion. He symptoms have seconds worth of chest tension. He smoked up to 1983, 20 years. He had a stress test a long time ago, it was normal. It was done on [facility]. 30 years ago possibly for life insurance. It was treadmill exercise. He denies angina, dyspnea on exertion, lower extremity edema, PND or orthopnea. He denies syncope. His COVID symptoms have resolved.  Family hx: Uncle that died of a stroke. No history of CVD.   EKG 02/11/2021 showed sinus rhythm with frequent PVCs with RBBB morphology c/f left sided origin  EKG 10/03/2015: Sinus bradycardia HR 59 bpm, no PVCs  Echocardiogram 03/05/2021: Normal LVEF, No RWMA. RV.No septal hypertrophy.Biatrial severe enlargement. Heavily calcified aortic valve. Mean gradient 13 mmHg  BL carotid US: 01/2019: no significant ICA stenosis  LDL 91 mg/dL  TSH 1.5  Interim 4/0/9811 He presented  to the ED in March, his apple watch notified him that he was in afib. He went to the ED was in RVR. He had troponin checked and it was 2,000, diagnosed with NSTEMI. EF was 50-55% and he had severe paradoxical low flow-low gradient AS. He had cath that showed ostial and prox LAD of 90%, mid RCA 80% lesion and distal 95%, Distal Lcx was 90%. He had 2V CABG to the SVG-RCA and LIMA-LAD. He is also s/p MAZE and LAA appendage  clip. He also underwent AV replacement with 23 mm Edwards Inspiris valve. He had some bradycardia and hypotension, BB was stopped. He was discharged on DAPT and no AC with LAA clip.  He presented  in early May with afib with RVR managed with IV dilt and converted to sinus brady rates 50s. Continue BB however while at home Bps were low and this was stopped. Otherwise, he had no significant issues.   Today, he presents post CABG. He is still doing cardiac rehab. He denies CP.  No palpitations. He walks 2-2.5 miles.  BP is normal.  Past Medical History:  Diagnosis Date   Adenomatous colon polyp 09/2007   Arthritis    OA / PAIN LEFT KNEE   Cancer (HCC)    prostate cancer   Cataract    removed both eyes   Coronary artery disease    COVID-19 virus infection    Diverticulosis of colon    w/o hemorrage   Heart murmur    TOLD HE HAS A SLIGHT MURMUR   Hyperlipidemia    Inguinal hernia    left side    Thyroid disease     Past Surgical History:  Procedure Laterality Date   AORTIC VALVE REPLACEMENT N/A 08/18/2022   Procedure: AORTIC VALVE REPLACEMENT (AVR) USING EDWARDS INSPIRIS AORTIC VALVE SIZE ;  Surgeon: Lyn Hollingshead, MD;  Location: [facility] OR;  Service: Open  Heart Surgery;  Laterality: N/A;   BACK SURGERY  05/12/2005   L5   CARDIAC CATHETERIZATION     CLIPPING OF ATRIAL APPENDAGE N/A 08/18/2022   Procedure: CLIPPING OF ATRIAL APPENDAGE USING ATRICURE 45 ATRICLIP;  Surgeon: Lyn Hollingshead, MD;  Location: [facility] OR;  Service: Open Heart Surgery;  Laterality: N/A;  Median Sternotomy   COLONOSCOPY  1999   Negative; Dr Russella Dar   COLONOSCOPY  2004   tics, hemorrhoids   COLONOSCOPY  2009   polyps (T.A.)   CORONARY ARTERY BYPASS GRAFT N/A 08/18/2022   Procedure: CORONARY ARTERY BYPASS GRAFTING (CABG) X2 USING LEFT INTERNAL MAMMARY ARTERY AND LEFT ENDOSCOPICALLY HARVESTED GREATER SAPHENOUS VEIN.;  Surgeon: Lyn Hollingshead, MD;  Location: [facility] OR;  Service: Open Heart Surgery;  Laterality: N/A;    HERNIA REPAIR  1987   HERNIA REPAIR  10/2003   KNEE SURGERY  1969   Patellar fracture fragment Lt   LAMINECTOMY  2000   L4-5   LEFT HEART CATH AND CORONARY ANGIOGRAPHY N/A 08/16/2022   Procedure: LEFT HEART CATH AND CORONARY ANGIOGRAPHY;  Surgeon: Orbie Pyo, MD;  Location: [facility] INVASIVE CV LAB;  Service: Cardiovascular;  Laterality: N/A;   LUMBAR LAMINECTOMY/DECOMPRESSION MICRODISCECTOMY Right 10/08/2015   Procedure: MICRO LUMBAR DECOMPRESSION L4-L5 AND L5-S1 ON RIGHT ;  Surgeon: Jene Every, MD;  Location: [facility] ORS;  Service: Orthopedics;  Laterality: Right;   MASS EXCISION  04/26/2011   Procedure: MINOR EXCISION OF MASS;  Surgeon: Velora Heckler, MD;  Location: [facility];  Service: General;  Laterality: Right;  Excise subcutaneous mass right axilla Minor Room   MAZE N/A 08/18/2022   Procedure: MAZE USING ATRICURE Oralia Manis;  Surgeon: Lyn Hollingshead, MD;  Location: [facility] OR;  Service: Open Heart Surgery;  Laterality: N/A;   POLYPECTOMY     PROSTATECTOMY  04/16/2005   TEE WITHOUT CARDIOVERSION N/A 08/18/2022   Procedure: TRANSESOPHAGEAL ECHOCARDIOGRAM;  Surgeon: Lyn Hollingshead, MD;  Location: [facility] OR;  Service: Open Heart Surgery;  Laterality: N/A;   TONSILLECTOMY     TOTAL KNEE ARTHROPLASTY Left 07/03/2013   Procedure: LEFT TOTAL KNEE ARTHROPLASTY;  Surgeon: Shelda Pal, MD;  Location: [facility] ORS;  Service: Orthopedics;  Laterality: Left;    Current Medications: Current Meds  Medication Sig   aspirin EC 81 MG tablet Take 1 tablet (81 mg total) by mouth daily.   beta carotene 16109 UNIT capsule Take 25,000 Units by mouth daily.   clopidogrel (PLAVIX) 75 MG tablet Take 1 tablet (75 mg total) by mouth daily.   fluticasone (FLONASE) 50 MCG/ACT nasal spray SPRAY ONE SPRAY IN EACH NOSTRIL ONCE DAILY (Patient taking differently: Place 1 spray into both nostrils daily.)   glycopyrrolate (ROBINUL) 2 MG tablet Take 1 tablet (2 mg total) by mouth every morning.   levothyroxine  (SYNTHROID) 50 MCG tablet Take 1 tablet by mouth daily   pyridOXINE (VITAMIN B-6) 100 MG tablet Take 100 mg by mouth daily.   rosuvastatin (CRESTOR) 40 MG tablet TAKE 1 TABLET BY MOUTH DAILY   vitamin C (ASCORBIC ACID) 500 MG tablet Take 500 mg by mouth 2 (two) times daily.   vitamin E 400 UNIT capsule Take 400 Units by mouth daily.     Allergies:   Atorvastatin   Social History   Socioeconomic History   Marital status: Married    Spouse name: Not on file   Number of children: 2   Years of education: 16   Highest education level:  Bachelor's degree (e.g., BA, AB, BS)  Occupational History   Occupation: insur exe    Employer: [employer]  Tobacco Use   Smoking status: Former   Smokeless tobacco: Never   Tobacco comments:    stopped smoking cigarettes 1983, 1 cigar /day 2001-2013  Vaping Use   Vaping Use: Never used  Substance and Sexual Activity   Alcohol use: Yes    Alcohol/week: 7.0 - 14.0 standard drinks of alcohol    Types: 7 - 14 Standard drinks or equivalent per week    Comment: scotch or wine daily   Drug use: No   Sexual activity: Yes  Other Topics Concern   Not on file  Social History Narrative   Exercises daily            Social Determinants of Health   Financial Resource Strain: Low Risk  (03/18/2022)   Overall Financial Resource Strain (CARDIA)    Difficulty of Paying Living Expenses: Not hard at all  Food Insecurity: No Food Insecurity (10/02/2022)   Hunger Vital Sign    Worried About Running Out of Food in the Last Year: Never true    Ran Out of Food in the Last Year: Never true  Transportation Needs: Unknown (10/02/2022)   PRAPARE - Transportation    Lack of Transportation (Medical): No    Lack of Transportation (Non-Medical): Patient declined  Physical Activity: Sufficiently Active (03/18/2022)   Exercise Vital Sign    Days of Exercise per Week: 5 days    Minutes of Exercise per Session: 30 min  Stress: No Stress Concern Present  (03/18/2022)   [employer] of Occupational Health - Occupational Stress Questionnaire    Feeling of Stress : Not at all  Social Connections: Socially Integrated (03/18/2022)   Social Connection and Isolation Panel [NHANES]    Frequency of Communication with Friends and Family: More than three times a week    Frequency of Social Gatherings with Friends and Family: Three times a week    Attends Religious Services: 1 to 4 times per year    Active Member of Clubs or Organizations: Yes    Attends Banker Meetings: 1 to 4 times per year    Marital Status: Married     Family History: The patient's family history includes Alcohol abuse in his paternal uncle; COPD in his mother; Cancer in his father; Colon cancer (age of onset: 67) in his father; Hypertension in his paternal uncle; Lung cancer in his mother and sister; Prostate cancer in his father; Stroke (age of onset: 48) in his paternal uncle; Urolithiasis in his father. There is no history of Asthma, Heart disease, Esophageal cancer, Stomach cancer, Liver disease, Colon polyps, or Rectal cancer.  ROS:   Please see the history of present illness.     All other systems reviewed and are negative.  EKGs/Labs/Other Studies Reviewed:    The following studies were reviewed today:  TTE Echocardiogram 09/30/22 1. Left ventricular ejection fraction, by estimation, is 50 to 55%. The left ventricle has low normal function. Left ventricular diastolic  parameters were normal.  2. Right ventricular systolic function is mildly reduced. The right  ventricular size is normal. There is normal pulmonary artery systolic pressure.  3. Left atrial size was mildly dilated.  4. The mitral valve is normal in structure. Mild mitral valve regurgitation.  5. S/p AVR (23 mm Edwards Inspiris (March 2024). Difficult to visualize. Peak and mean gradients through the valve are 18 and 10 mm  Hg respectively. The aortic valve has been repaired/replaced.  Aortic valve regurgitation  is not visualized. There is a 23 mm Edwards Inspiris valve present in the aortic position. Procedure Date:08/18/22.  6. The inferior vena cava is normal in size with greater than 50%  respiratory variability, suggesting right atrial pressure of 3 mmHg.    LHC 08/16/2022   Ost LAD to Prox LAD lesion is 90% stenosed.   Dist RCA lesion is 95% stenosed.   Mid RCA lesion is 80% stenosed.   Dist Cx lesion is 90% stenosed.   Mid LAD lesion is 65% stenosed.   1.  High-grade serial lesions of right coronary artery. 2.  High-grade calcified proximal LAD stenosis; the LAD is a relatively calcified vessel and require long segment stenting peripheral revascularization. 3.  High-grade distal left circumflex disease. 4.  LVEDP of 3 mmHg.   Recommendations: The results were reviewed with Dr. Allyson Sabal, a cardiothoracic surgical consult will be obtained.   EKG:  EKG is  ordered today.  The ekg ordered today demonstrates   03/10/2021-Sinus bradycardia, no PVCs  Recent Labs: 08/15/2022: ALT 24; B Natriuretic Peptide 352.2 10/01/2022: Magnesium 2.5; TSH 1.787 10/02/2022: BUN 18; Creatinine, Ser 0.97; Hemoglobin 11.7; Platelets 345; Potassium 3.2; Sodium 138   Recent Lipid Panel    Component Value Date/Time   CHOL 159 08/17/2022 0605   CHOL 224 (H) 09/02/2014 0919   TRIG 63 08/17/2022 0605   TRIG 116 09/02/2014 0919   HDL 61 08/17/2022 0605   HDL 76 09/02/2014 0919   CHOLHDL 2.6 08/17/2022 0605   VLDL 13 08/17/2022 0605   LDLCALC 85 08/17/2022 0605   LDLCALC 125 (H) 09/02/2014 0919   LDLDIRECT 179.6 01/04/2013 0740     Risk Assessment/Calculations:       Physical Exam:    VS:  BP 108/72   Pulse 77   Ht 5\' 7"  (1.702 m)   Wt 132 lb (59.9 kg)   SpO2 98%   BMI 20.67 kg/m     Wt Readings from Last 3 Encounters:  10/28/22 132 lb (59.9 kg)  10/01/22 134 lb 14.7 oz (61.2 kg)  09/30/22 135 lb (61.2 kg)     GEN:  Well nourished, well developed in no acute  distress HEENT: Normal NECK: No JVD; No carotid bruits CARDIAC: RRR, no murmurs, no rubs, gallops RESPIRATORY:  Clear to auscultation without rales, wheezing or rhonchi  ABDOMEN: Soft, non-tender, non-distended MUSCULOSKELETAL:  No edema; No deformity  SKIN: Warm and dry NEUROLOGIC:  Alert and oriented x 3 PSYCHIATRIC:  Normal affect   ASSESSMENT:   CAD s/p CABG: 2V 08/18/2022 . Presented with NSTEMI in the setting of afib in March 2024. Cath showed significant ostial LAD and mid RCA disease. Lcx has distal disease. He had LIMA-LAD and SVG-RCA. No chest pain. Had ventricular ectopy fall 2022 but no symptoms - change DAPT to plavix and eliquis 5 mg BID; since trop was elevated c/f NSTEMI will do plavix and eliquis until 08/16/2023, then just eliquis (unless TEE shows LAA is clipped) - off BB with hypotension - lipids pending - continue crestor 40 mg daily; LDL goal at least < 70 mg/dL  Paroxysmal Atrial fibrillation : diagnosed 07/2022. s/p MAZE, s/p clipping of the atrial appendage 08/18/2022. - in sinus rhythm -- changed DAPT to plavix and eliquis, can consider TEE to assess LAA  and ensure it is clipped, we discussed this and will consider on the FU visit - start eliquis 5 mg BID - off  BB per above  Paradoxical Low flow-low gradient severe AS, heavily calcified: s/p AVR 08/18/2022. Mean gradient 10 mmHg.  PLAN:    In order of problems listed above:  Start Eliquis 5 mg BID Stop asa Follow up 6 months      Medication Adjustments/Labs and Tests Ordered: Current medicines are reviewed at length with the patient today.  Concerns regarding  Signed, Maisie Fus, MD  10/28/2022 8:06 AM    [facility]

## 2022-10-29 ENCOUNTER — Encounter (HOSPITAL_COMMUNITY)
Admission: RE | Admit: 2022-10-29 | Discharge: 2022-10-29 | Disposition: A | Discharge status: Home / Self Care | Payer: PPO | Source: Ambulatory Visit | Attending: Cardiology | Admitting: Cardiology

## 2022-10-29 ENCOUNTER — Encounter (HOSPITAL_COMMUNITY): Payer: PPO

## 2022-10-29 ENCOUNTER — Encounter: Payer: Self-pay | Admitting: Internal Medicine

## 2022-10-29 DIAGNOSIS — I214 Non-ST elevation (NSTEMI) myocardial infarction: Secondary | ICD-10-CM | POA: Diagnosis not present

## 2022-10-29 DIAGNOSIS — Z952 Presence of prosthetic heart valve: Secondary | ICD-10-CM

## 2022-10-29 DIAGNOSIS — Z951 Presence of aortocoronary bypass graft: Secondary | ICD-10-CM

## 2022-11-01 ENCOUNTER — Encounter (HOSPITAL_COMMUNITY)
Admission: RE | Admit: 2022-11-01 | Discharge: 2022-11-01 | Disposition: A | Discharge status: Home / Self Care | Payer: PPO | Source: Ambulatory Visit | Attending: Cardiology | Admitting: Cardiology

## 2022-11-01 ENCOUNTER — Encounter (HOSPITAL_COMMUNITY): Payer: PPO

## 2022-11-01 ENCOUNTER — Telehealth: Payer: Self-pay | Admitting: General Practice

## 2022-11-01 DIAGNOSIS — E782 Mixed hyperlipidemia: Secondary | ICD-10-CM

## 2022-11-01 DIAGNOSIS — I214 Non-ST elevation (NSTEMI) myocardial infarction: Secondary | ICD-10-CM

## 2022-11-01 DIAGNOSIS — Z952 Presence of prosthetic heart valve: Secondary | ICD-10-CM

## 2022-11-01 DIAGNOSIS — Z951 Presence of aortocoronary bypass graft: Secondary | ICD-10-CM

## 2022-11-01 DIAGNOSIS — H00022 Hordeolum internum right lower eyelid: Secondary | ICD-10-CM | POA: Diagnosis not present

## 2022-11-01 NOTE — Progress Notes (Unsigned)
    Subjective:    Patient ID: Jeremy Waters, male    DOB: March 06, 1946, 77 y.o.   MRN: [id number]      HPI Jeremy Waters is here for No chief complaint on file.   Constipation -   On robinul for PUD  Medications and allergies reviewed with patient and updated if appropriate.  Current Outpatient Medications on File Prior to Visit  Medication Sig Dispense Refill   apixaban (ELIQUIS) 5 MG TABS tablet Take 1 tablet (5 mg total) by mouth 2 (two) times daily. 60 tablet 3   beta carotene 40981 UNIT capsule Take 25,000 Units by mouth daily.     clopidogrel (PLAVIX) 75 MG tablet Take 1 tablet (75 mg total) by mouth daily. 30 tablet 11   fluticasone (FLONASE) 50 MCG/ACT nasal spray SPRAY ONE SPRAY IN EACH NOSTRIL ONCE DAILY (Patient taking differently: Place 1 spray into both nostrils daily.) 16 mL 3   glycopyrrolate (ROBINUL) 2 MG tablet Take 1 tablet (2 mg total) by mouth every morning. 90 tablet 0   levothyroxine (SYNTHROID) 50 MCG tablet Take 1 tablet by mouth daily 90 tablet 2   pyridOXINE (VITAMIN B-6) 100 MG tablet Take 100 mg by mouth daily.     rosuvastatin (CRESTOR) 40 MG tablet TAKE 1 TABLET BY MOUTH DAILY 90 tablet 1   vitamin C (ASCORBIC ACID) 500 MG tablet Take 500 mg by mouth 2 (two) times daily.     vitamin E 400 UNIT capsule Take 400 Units by mouth daily.     No current facility-administered medications on file prior to visit.    Review of Systems     Objective:  There were no vitals filed for this visit. BP Readings from Last 3 Encounters:  10/28/22 108/72  10/02/22 120/76  09/30/22 (!) 86/54   Wt Readings from Last 3 Encounters:  10/28/22 132 lb (59.9 kg)  10/01/22 134 lb 14.7 oz (61.2 kg)  09/30/22 135 lb (61.2 kg)   There is no height or weight on file to calculate BMI.    Physical Exam         Assessment & Plan:    See Problem List for Assessment and Plan of chronic medical problems.

## 2022-11-01 NOTE — Telephone Encounter (Signed)
Pt returning call for lab results. Pt states he has reviewed via mychart and had some additional f/u questions

## 2022-11-01 NOTE — Telephone Encounter (Signed)
Left message for patient to return the call.

## 2022-11-01 NOTE — Telephone Encounter (Signed)
Left message to call back  

## 2022-11-02 ENCOUNTER — Ambulatory Visit: Payer: Self-pay

## 2022-11-02 ENCOUNTER — Ambulatory Visit (INDEPENDENT_AMBULATORY_CARE_PROVIDER_SITE_OTHER): Payer: PPO | Admitting: Internal Medicine

## 2022-11-02 ENCOUNTER — Encounter: Payer: Self-pay | Admitting: Internal Medicine

## 2022-11-02 VITALS — BP 100/62 | HR 75 | Temp 98.0°F | Ht 67.0 in | Wt 134.8 lb

## 2022-11-02 DIAGNOSIS — E038 Other specified hypothyroidism: Secondary | ICD-10-CM | POA: Diagnosis not present

## 2022-11-02 DIAGNOSIS — K5900 Constipation, unspecified: Secondary | ICD-10-CM | POA: Insufficient documentation

## 2022-11-02 NOTE — Assessment & Plan Note (Addendum)
New Started after recent hospitalization and he was started on metoprolol-likely multifactorial-was hospitalized, started on different medication, less active, eating less, etc. TSH within normal range last month so thyroid is not contributing to constipation Constipation has improved some with the discontinuation of metoprolol, but stool is still hard Abdomen is soft.  He denies abdominal pain Taking Benefiber daily and drinking plenty of water-continue Advised to start a stool softener and titrate dose as needed Continue regular exercise Continue to eat lots of fruits, vegetables If the above is not effective-addition of the stool softener-he can start MiraLAX daily I would expect as he continues to be active, increases his eating that his bowel movements and may improve He will let me know if he has any concerns or any questions via MyChart Can refer to GI if needed

## 2022-11-02 NOTE — Patient Outreach (Signed)
  Care Coordination   Follow Up Visit Note   11/02/2022 Name: NICOLAOS MITRANO MRN: [id number] DOB: 20-Dec-1945  [name] is a 77 y.o. year old male who sees Burns, Bobette Mo, MD for primary care. I spoke with  [name] by phone today.  What matters to the patients health and wellness today?  Patient reports primary problem today is constipation. He sates this has never been an issue for him in the past. He states he is eating fiber, drinking fluids. He states he is concerned about straining as he is on a blood thinner. He continues to have blood pressure monitored in cardiac rehab. Mr. Deviney states he has an appointment with PCP later today.  Goals Addressed             This Visit's Progress    continue to improve post hospitalization       Interventions Today    Flowsheet Row Most Recent Value  Chronic Disease   Chronic disease during today's visit Other  General Interventions   General Interventions Discussed/Reviewed General Interventions Reviewed, Doctor Visits  Doctor Visits Discussed/Reviewed Doctor Visits Reviewed  PCP/Specialist Visits Compliance with follow-up visit  [reiterated providers/disciplined involved on patient's care team.]  Education Interventions   Provided Verbal Education On When to see the doctor, Medication, Other  [discussed patient report of constipation: advised to follow up with PCP as scheduled-patient reports has increased fiber, is drinking fluids has an appointment with PCP today.]  Nutrition Interventions   Nutrition Discussed/Reviewed Nutrition Reviewed  Pharmacy Interventions   Pharmacy Dicussed/Reviewed Pharmacy Topics Reviewed            SDOH assessments and interventions completed:  No  Care Coordination Interventions:  Yes, provided   Follow up plan: Follow up call scheduled for 11/24/22    Encounter Outcome:  Pt. Visit Completed   Kathyrn Sheriff, RN, MSN, BSN, CCM [facility] Care Coordinator [phone]

## 2022-11-02 NOTE — Assessment & Plan Note (Signed)
Chronic Recent TSH within normal range Continue levothyroxine 50 mcg daily 

## 2022-11-02 NOTE — Patient Instructions (Signed)
Visit Information  Thank you for taking time to visit with me today. Please don't hesitate to contact me if I can be of assistance to you.   Following are the goals we discussed today:  Continue to attend provider visits as scheduled Continue to take medications as prescribed. Contact provider with any questions or concerns. Contact RN Care Coordinator if care coordination needs prior to next telephone call  Our next appointment is by telephone on 11/24/22 at 10:30 am  Please call the care guide team at [phone] if you need to cancel or reschedule your appointment.   If you are experiencing a Mental Health or Behavioral Health Crisis or need someone to talk to, please call the Suicide and Crisis Lifeline: 76  Kathyrn Sheriff, RN, MSN, BSN, CCM [facility] Care Coordinator [phone]

## 2022-11-02 NOTE — Patient Instructions (Addendum)
      Medications changes include :   start a stool softener daily - titrate dose as needed.  The other thing you can try if needed is miralax daily.       Return if symptoms worsen or fail to improve.    Constipation, Adult Constipation is when a person has trouble pooping (having a bowel movement). When you have this condition, you may poop fewer than 3 times a week. Your poop (stool) may also be dry, hard, or bigger than normal. Follow these instructions at home: Eating and drinking  Eat foods that have a lot of fiber, such as: Fresh fruits and vegetables. Whole grains. Beans. Eat less of foods that are low in fiber and high in fat and sugar, such as: [place] fries. Hamburgers. Cookies. Candy. Soda. Drink enough fluid to keep your pee (urine) pale yellow. General instructions Exercise regularly or as told by your doctor. Try to do 150 minutes of exercise each week. Go to the restroom when you feel like you need to poop. Do not hold it in. Take over-the-counter and prescription medicines only as told by your doctor. These include any fiber supplements. When you poop: Do deep breathing while relaxing your lower belly (abdomen). Relax your pelvic floor. The pelvic floor is a group of muscles that support the rectum, bladder, and intestines (as well as the uterus in women). Watch your condition for any changes. Tell your doctor if you notice any. Keep all follow-up visits as told by your doctor. This is important. Contact a doctor if: You have pain that gets worse. You have a fever. You have not pooped for 4 days. You vomit. You are not hungry. You lose weight. You are bleeding from the opening of the butt (anus). You have thin, pencil-like poop. Get help right away if: You have a fever, and your symptoms suddenly get worse. You leak poop or have blood in your poop. Your belly feels hard or bigger than normal (bloated). You have very bad belly pain. You feel dizzy  or you faint. Summary Constipation is when a person poops fewer than 3 times a week, has trouble pooping, or has poop that is dry, hard, or bigger than normal. Eat foods that have a lot of fiber. Drink enough fluid to keep your pee (urine) pale yellow. Take over-the-counter and prescription medicines only as told by your doctor. These include any fiber supplements. This information is not intended to replace advice given to you by your health care provider. Make sure you discuss any questions you have with your health care provider. Document Revised: 03/24/2022 Document Reviewed: 03/24/2022 Elsevier Patient Education  2024 [employer].

## 2022-11-02 NOTE — Telephone Encounter (Signed)
Spoke with patient regarding his lab results in detail and the message from [employer].  Repeated several times. He states understanding.  Will send message to Lipid clinic to reach out to patient.

## 2022-11-03 ENCOUNTER — Encounter (HOSPITAL_COMMUNITY)
Admission: RE | Admit: 2022-11-03 | Discharge: 2022-11-03 | Disposition: A | Discharge status: Home / Self Care | Payer: PPO | Source: Ambulatory Visit | Attending: Cardiology | Admitting: Cardiology

## 2022-11-03 ENCOUNTER — Encounter (HOSPITAL_COMMUNITY): Payer: PPO

## 2022-11-03 DIAGNOSIS — Z951 Presence of aortocoronary bypass graft: Secondary | ICD-10-CM

## 2022-11-03 DIAGNOSIS — I214 Non-ST elevation (NSTEMI) myocardial infarction: Secondary | ICD-10-CM

## 2022-11-03 DIAGNOSIS — Z952 Presence of prosthetic heart valve: Secondary | ICD-10-CM

## 2022-11-04 DIAGNOSIS — Z6820 Body mass index (BMI) 20.0-20.9, adult: Secondary | ICD-10-CM | POA: Diagnosis not present

## 2022-11-04 DIAGNOSIS — Z7901 Long term (current) use of anticoagulants: Secondary | ICD-10-CM | POA: Diagnosis not present

## 2022-11-04 DIAGNOSIS — I2510 Atherosclerotic heart disease of native coronary artery without angina pectoris: Secondary | ICD-10-CM | POA: Diagnosis not present

## 2022-11-04 DIAGNOSIS — I4891 Unspecified atrial fibrillation: Secondary | ICD-10-CM | POA: Diagnosis not present

## 2022-11-04 DIAGNOSIS — E46 Unspecified protein-calorie malnutrition: Secondary | ICD-10-CM | POA: Diagnosis not present

## 2022-11-04 DIAGNOSIS — Z87891 Personal history of nicotine dependence: Secondary | ICD-10-CM | POA: Diagnosis not present

## 2022-11-04 DIAGNOSIS — Z951 Presence of aortocoronary bypass graft: Secondary | ICD-10-CM | POA: Diagnosis not present

## 2022-11-05 ENCOUNTER — Encounter (HOSPITAL_COMMUNITY): Payer: PPO

## 2022-11-05 ENCOUNTER — Encounter (HOSPITAL_COMMUNITY)
Admission: RE | Admit: 2022-11-05 | Discharge: 2022-11-05 | Disposition: A | Discharge status: Home / Self Care | Payer: PPO | Source: Ambulatory Visit | Attending: Cardiology | Admitting: Cardiology

## 2022-11-05 DIAGNOSIS — I214 Non-ST elevation (NSTEMI) myocardial infarction: Secondary | ICD-10-CM

## 2022-11-05 DIAGNOSIS — Z951 Presence of aortocoronary bypass graft: Secondary | ICD-10-CM

## 2022-11-05 DIAGNOSIS — Z952 Presence of prosthetic heart valve: Secondary | ICD-10-CM

## 2022-11-08 ENCOUNTER — Encounter (HOSPITAL_COMMUNITY): Payer: PPO

## 2022-11-08 ENCOUNTER — Encounter (HOSPITAL_COMMUNITY)
Admission: RE | Admit: 2022-11-08 | Discharge: 2022-11-08 | Disposition: A | Discharge status: Home / Self Care | Payer: PPO | Source: Ambulatory Visit | Attending: Cardiology | Admitting: Cardiology

## 2022-11-08 DIAGNOSIS — I214 Non-ST elevation (NSTEMI) myocardial infarction: Secondary | ICD-10-CM

## 2022-11-08 DIAGNOSIS — Z952 Presence of prosthetic heart valve: Secondary | ICD-10-CM

## 2022-11-08 DIAGNOSIS — Z951 Presence of aortocoronary bypass graft: Secondary | ICD-10-CM

## 2022-11-10 ENCOUNTER — Encounter (HOSPITAL_COMMUNITY): Payer: PPO

## 2022-11-10 ENCOUNTER — Encounter (HOSPITAL_COMMUNITY)
Admission: RE | Admit: 2022-11-10 | Discharge: 2022-11-10 | Disposition: A | Discharge status: Home / Self Care | Payer: PPO | Source: Ambulatory Visit | Attending: Cardiology | Admitting: Cardiology

## 2022-11-10 DIAGNOSIS — Z952 Presence of prosthetic heart valve: Secondary | ICD-10-CM

## 2022-11-10 DIAGNOSIS — M545 Low back pain, unspecified: Secondary | ICD-10-CM | POA: Diagnosis not present

## 2022-11-10 DIAGNOSIS — Z951 Presence of aortocoronary bypass graft: Secondary | ICD-10-CM

## 2022-11-10 DIAGNOSIS — M542 Cervicalgia: Secondary | ICD-10-CM | POA: Diagnosis not present

## 2022-11-10 DIAGNOSIS — I214 Non-ST elevation (NSTEMI) myocardial infarction: Secondary | ICD-10-CM | POA: Diagnosis not present

## 2022-11-11 ENCOUNTER — Other Ambulatory Visit: Payer: Self-pay | Admitting: Radiology

## 2022-11-11 ENCOUNTER — Telehealth: Payer: Self-pay | Admitting: Internal Medicine

## 2022-11-11 NOTE — Telephone Encounter (Signed)
Prescription Request  11/11/2022  LOV: 11/02/2022  What is the name of the medication or equipment?  levothyroxine (SYNTHROID) 50 MCG tablet  Have you contacted your pharmacy to request a refill? Yes   Which pharmacy would you like this sent to?  [facility] Phone: [phone]    Patient notified that their request is being sent to the clinical staff for review and that they should receive a response within 2 business days.   Please advise at Mobile [phone] (mobile)

## 2022-11-12 ENCOUNTER — Encounter (HOSPITAL_COMMUNITY)
Admission: RE | Admit: 2022-11-12 | Discharge: 2022-11-12 | Disposition: A | Discharge status: Home / Self Care | Payer: PPO | Source: Ambulatory Visit | Attending: Cardiology | Admitting: Cardiology

## 2022-11-12 ENCOUNTER — Encounter (HOSPITAL_COMMUNITY): Payer: PPO

## 2022-11-12 DIAGNOSIS — Z951 Presence of aortocoronary bypass graft: Secondary | ICD-10-CM

## 2022-11-12 DIAGNOSIS — I214 Non-ST elevation (NSTEMI) myocardial infarction: Secondary | ICD-10-CM | POA: Diagnosis not present

## 2022-11-12 DIAGNOSIS — Z952 Presence of prosthetic heart valve: Secondary | ICD-10-CM

## 2022-11-12 MED ORDER — LEVOTHYROXINE SODIUM 50 MCG PO TABS: 50.0000 ug | ORAL_TABLET | Freq: Every day | ORAL | 2 refills | Status: DC

## 2022-11-15 ENCOUNTER — Encounter (HOSPITAL_COMMUNITY)
Admission: RE | Admit: 2022-11-15 | Discharge: 2022-11-15 | Disposition: A | Discharge status: Home / Self Care | Payer: PPO | Source: Ambulatory Visit | Attending: Cardiology | Admitting: Cardiology

## 2022-11-15 ENCOUNTER — Encounter: Payer: Self-pay | Admitting: Internal Medicine

## 2022-11-15 ENCOUNTER — Encounter (HOSPITAL_COMMUNITY): Payer: PPO

## 2022-11-15 DIAGNOSIS — Z952 Presence of prosthetic heart valve: Secondary | ICD-10-CM

## 2022-11-15 DIAGNOSIS — I214 Non-ST elevation (NSTEMI) myocardial infarction: Secondary | ICD-10-CM

## 2022-11-15 DIAGNOSIS — Z951 Presence of aortocoronary bypass graft: Secondary | ICD-10-CM

## 2022-11-17 ENCOUNTER — Encounter (HOSPITAL_COMMUNITY)
Admission: RE | Admit: 2022-11-17 | Discharge: 2022-11-17 | Disposition: A | Discharge status: Home / Self Care | Payer: PPO | Source: Ambulatory Visit | Attending: Cardiology | Admitting: Cardiology

## 2022-11-17 ENCOUNTER — Encounter (HOSPITAL_COMMUNITY): Payer: PPO

## 2022-11-17 DIAGNOSIS — Z951 Presence of aortocoronary bypass graft: Secondary | ICD-10-CM

## 2022-11-17 DIAGNOSIS — Z952 Presence of prosthetic heart valve: Secondary | ICD-10-CM

## 2022-11-17 DIAGNOSIS — H903 Sensorineural hearing loss, bilateral: Secondary | ICD-10-CM | POA: Diagnosis not present

## 2022-11-17 DIAGNOSIS — I214 Non-ST elevation (NSTEMI) myocardial infarction: Secondary | ICD-10-CM

## 2022-11-19 ENCOUNTER — Encounter (HOSPITAL_COMMUNITY): Payer: PPO

## 2022-11-19 ENCOUNTER — Encounter (HOSPITAL_COMMUNITY)
Admission: RE | Admit: 2022-11-19 | Discharge: 2022-11-19 | Disposition: A | Discharge status: Home / Self Care | Payer: PPO | Source: Ambulatory Visit | Attending: Cardiology | Admitting: Cardiology

## 2022-11-19 DIAGNOSIS — Z952 Presence of prosthetic heart valve: Secondary | ICD-10-CM

## 2022-11-19 DIAGNOSIS — I214 Non-ST elevation (NSTEMI) myocardial infarction: Secondary | ICD-10-CM

## 2022-11-19 DIAGNOSIS — Z951 Presence of aortocoronary bypass graft: Secondary | ICD-10-CM

## 2022-11-22 ENCOUNTER — Encounter (HOSPITAL_COMMUNITY): Payer: PPO

## 2022-11-22 ENCOUNTER — Encounter (HOSPITAL_COMMUNITY)
Admission: RE | Admit: 2022-11-22 | Discharge: 2022-11-22 | Disposition: A | Discharge status: Home / Self Care | Payer: PPO | Source: Ambulatory Visit | Attending: Internal Medicine | Admitting: Internal Medicine

## 2022-11-22 DIAGNOSIS — I214 Non-ST elevation (NSTEMI) myocardial infarction: Secondary | ICD-10-CM | POA: Insufficient documentation

## 2022-11-22 DIAGNOSIS — Z952 Presence of prosthetic heart valve: Secondary | ICD-10-CM | POA: Insufficient documentation

## 2022-11-22 DIAGNOSIS — Z951 Presence of aortocoronary bypass graft: Secondary | ICD-10-CM | POA: Diagnosis not present

## 2022-11-22 NOTE — Progress Notes (Signed)
Cardiac Individual Treatment Plan  Patient Details  Name: JOSHUA MOCKLER MRN: [id number] Date of Birth: 10-27-1945 Referring Provider:   Flowsheet Row INTENSIVE CARDIAC REHAB ORIENT from 09/21/2022 in [facility]  Referring Provider Enter, Waverly Ferrari, MD       Initial Encounter Date:  Flowsheet Row INTENSIVE CARDIAC REHAB ORIENT from 09/21/2022 in [facility]  Date 09/21/22       Visit Diagnosis: 08/15/22 NSTEMI (non-ST elevated myocardial infarction) (HCC)  08/18/22 S/P CABG x 2  08/18/22 S/P AVR (aortic valve replacement)  Patient's Home Medications on Admission:  Current Outpatient Medications:    apixaban (ELIQUIS) 5 MG TABS tablet, Take 1 tablet (5 mg total) by mouth 2 (two) times daily., Disp: 60 tablet, Rfl: 3   beta carotene 78295 UNIT capsule, Take 25,000 Units by mouth daily., Disp: , Rfl:    clopidogrel (PLAVIX) 75 MG tablet, Take 1 tablet (75 mg total) by mouth daily. (Patient not taking: Reported on 11/02/2022), Disp: 30 tablet, Rfl: 11   fluticasone (FLONASE) 50 MCG/ACT nasal spray, SPRAY ONE SPRAY IN EACH NOSTRIL ONCE DAILY (Patient taking differently: Place 1 spray into both nostrils daily.), Disp: 16 mL, Rfl: 3   levothyroxine (SYNTHROID) 50 MCG tablet, Take 1 tablet (50 mcg total) by mouth daily., Disp: 90 tablet, Rfl: 2   pyridOXINE (VITAMIN B-6) 100 MG tablet, Take 100 mg by mouth daily., Disp: , Rfl:    rosuvastatin (CRESTOR) 40 MG tablet, TAKE 1 TABLET BY MOUTH DAILY, Disp: 90 tablet, Rfl: 1   vitamin C (ASCORBIC ACID) 500 MG tablet, Take 500 mg by mouth 2 (two) times daily., Disp: , Rfl:    vitamin E 400 UNIT capsule, Take 400 Units by mouth daily., Disp: , Rfl:   Past Medical History: Past Medical History:  Diagnosis Date   Adenomatous colon polyp 09/2007   Arthritis    OA / PAIN LEFT KNEE   Cancer (HCC)    prostate cancer   Cataract     removed both eyes   Coronary artery disease    COVID-19 virus infection    Diverticulosis of colon    w/o hemorrage   Heart murmur    TOLD HE HAS A SLIGHT MURMUR   Hyperlipidemia    Inguinal hernia    left side    Thyroid disease     Tobacco Use: Social History   Tobacco Use  Smoking Status Former  Smokeless Tobacco Never  Tobacco Comments   stopped smoking cigarettes 1983, 1 cigar /day 2001-2013    Labs: Review Flowsheet  More data exists      Latest Ref Rng & Units 08/16/2022 08/17/2022 08/18/2022 08/19/2022 10/28/2022  Labs for ITP Cardiac and Pulmonary Rehab  Cholestrol 100 - 199 mg/dL - 621  - - 308   LDL (calc) 0 - 99 mg/dL - 85  - - 76   HDL-C >65 mg/dL - 61  - - 61   Trlycerides 0 - 149 mg/dL - 63  - - 72   Hemoglobin A1c 4.8 - 5.6 % 5.4  - - - -  PH, Arterial 7.35 - 7.45 - - 7.308  7.336  7.386  7.365  7.375  7.388  7.337  7.425  7.405  -  PCO2 arterial 32 - 48 mmHg - - 41.7  37.1  37.5  41.4  40.7  40.0  34.9  34.6  34.7  -  Bicarbonate 20.0 - 28.0 mmol/L - - 21.0  20.3  22.5  23.7  23.8  24.1  18.7  22.6  21.6  -  TCO2 22 - 32 mmol/L - - 22  22  24  26  26  25  25  25  25  25  28  20  26  24  23   -  Acid-base deficit 0.0 - 2.0 mmol/L - - 5.0  6.0  2.0  2.0  1.0  1.0  6.0  1.0  3.0  -  O2 Saturation % - - 99  99  100  100  89  100  100  98  99  -    Capillary Blood Glucose: Lab Results  Component Value Date   GLUCAP 88 08/23/2022   GLUCAP 83 08/23/2022   GLUCAP 87 08/22/2022   GLUCAP 241 (H) 08/22/2022   GLUCAP 124 (H) 08/22/2022     Exercise Target Goals: Exercise Program Goal: Individual exercise prescription set using results from initial 6 min walk test and THRR while considering  patient's activity barriers and safety.   Exercise Prescription Goal: Initial exercise prescription builds to 30-45 minutes a day of aerobic activity, 2-3 days per week.  Home exercise guidelines will be given to patient during program as part of exercise prescription that  the participant will acknowledge.  Activity Barriers & Risk Stratification:  Activity Barriers & Cardiac Risk Stratification - 09/21/22 1030       Activity Barriers & Cardiac Risk Stratification   Activity Barriers Left Knee Replacement;Back Problems   Hx 3 back surgeries. Sees PT every 3 weeks for back.   Cardiac Risk Stratification High             6 Minute Walk:  6 Minute Walk     Row Name 09/21/22 1041         6 Minute Walk   Phase Initial     Distance 1327 feet     Walk Time 6 minutes     # of Rest Breaks 0     MPH 2.51     METS 2.76     RPE 11     Perceived Dyspnea  0     VO2 Peak 9.66     Symptoms No     Resting HR 61 bpm     Resting BP 94/66     Resting Oxygen Saturation  98 %     Exercise Oxygen Saturation  during 6 min walk 96 %     Max Ex. HR 78 bpm     Max Ex. BP 124/85     2 Minute Post BP 114/79              Oxygen Initial Assessment:   Oxygen Re-Evaluation:   Oxygen Discharge (Final Oxygen Re-Evaluation):   Initial Exercise Prescription:  Initial Exercise Prescription - 09/21/22 1100       Date of Initial Exercise RX and Referring Provider   Date 09/21/22    Referring Provider Enter, Waverly Ferrari, MD    Expected Discharge Date 12/03/22      Recumbant Elliptical   Level 1    Minutes 15    METs 2.8      Track   Laps 14    Minutes 15    METs 2.79      Prescription Details   Frequency (times per week) 3    Duration Progress to 30 minutes of continuous aerobic without signs/symptoms of physical  distress      Intensity   THRR 40-80% of Max Heartrate 58-116    Ratings of Perceived Exertion 11-13    Perceived Dyspnea 0-4      Progression   Progression Continue to progress workloads to maintain intensity without signs/symptoms of physical distress.      Resistance Training   Training Prescription Yes    Weight 3 lbs    Reps 10-15             Perform Capillary Blood Glucose checks as needed.  Exercise Prescription  Changes:   Exercise Prescription Changes     Row Name 09/29/22 [phone] 10/11/22 0835 10/29/22 0824 11/10/22 0800       Response to Exercise   Blood Pressure (Admit) 94/64 100/62 110/64 100/62    Blood Pressure (Exercise) 124/76 98/62 112/60 118/70    Blood Pressure (Exit) 100/60 98/60 106/64 102/64    Heart Rate (Admit) 76 bpm 60 bpm 84 bpm 83 bpm    Heart Rate (Exercise) 112 bpm 108 bpm 100 bpm 107 bpm    Heart Rate (Exit) 68 bpm 67 bpm 78 bpm 77 bpm    Rating of Perceived Exertion (Exercise) 12 13 12 12     Perceived Dyspnea (Exercise) 0 0 0 0    Symptoms none none none none    Comments Pt first day in the CRP2 program. REVD MET's, goals and home EXrx REVD MET's Revd METs and goals    Duration Progress to 30 minutes of  aerobic without signs/symptoms of physical distress Progress to 30 minutes of  aerobic without signs/symptoms of physical distress Progress to 30 minutes of  aerobic without signs/symptoms of physical distress Continue with 30 min of aerobic exercise without signs/symptoms of physical distress.    Intensity THRR unchanged THRR unchanged THRR unchanged THRR unchanged      Progression   Progression Continue to progress workloads to maintain intensity without signs/symptoms of physical distress. Continue to progress workloads to maintain intensity without signs/symptoms of physical distress. Continue to progress workloads to maintain intensity without signs/symptoms of physical distress. Continue to progress workloads to maintain intensity without signs/symptoms of physical distress.    Average METs -- 5.13 5.73 6.1      Resistance Training   Training Prescription No No Yes No    Weight -- -- 5 lbs wts --    Reps -- -- 10-15 --    Time -- -- 10 Minutes --      Recumbant Elliptical   Level 1 2 3 3     RPM 73 74 80 88    Watts 100 102 119 124    Minutes 15 15 15 15     METs 6.3 6.3 7.4 7.9      Track   Laps 18 24 24 25     Minutes 15 15 15 15     METs 3.3 4.06 4.06 4.19       Home Exercise Plan   Plans to continue exercise at -- Home (comment) Home (comment) Home (comment)    Frequency -- Add 3 additional days to program exercise sessions. Add 3 additional days to program exercise sessions. Add 3 additional days to program exercise sessions.    Initial Home Exercises Provided -- 10/11/22 10/11/22 10/11/22             Exercise Comments:   Exercise Comments     Row Name 09/29/22 0845 10/11/22 0843 10/29/22 0827 11/10/22 4742     Exercise Comments Pt first day  in the CRP2 [facility] Program. Pt tolerated exercise well with an average MET level of 4.8. Pt is off to a great start and is learning his THRR, RPE and ExRx Pt first day in the CRP2 program. Pt tolerated exercise well with an average MET level of 5.13. Pt feels good about his goals, he is increasing MET's, strength and stamina. Pt is excited about doing more for home ExRx. He is already walking, but he will add in some sternal precautions core exercises and hand weights for 30-45 mins 5-7 days a week. Pt will let me know if the new exercises interfere with chronic back issues. Pt is feeling good about his progress so far. REVD MET's. Pt tolerated exercise well with an average MET level of 5.73. Pt feels good about his progress. Pt is doing very well over all and is increasing MET's Reviewed METs and goals. Pt is tolerating exercise well with an avg MET level of 6.1. Pt continues to progress well within program and is happy with his progress towards his goals.             Exercise Goals and Review:   Exercise Goals     Row Name 09/21/22 0946             Exercise Goals   Increase Physical Activity Yes       Intervention Provide advice, education, support and counseling about physical activity/exercise needs.;Develop an individualized exercise prescription for aerobic and resistive training based on initial evaluation findings, risk stratification, comorbidities and participant's personal goals.        Expected Outcomes Short Term: Attend rehab on a regular basis to increase amount of physical activity.;Long Term: Add in home exercise to make exercise part of routine and to increase amount of physical activity.;Long Term: Exercising regularly at least 3-5 days a week.       Increase Strength and Stamina Yes       Intervention Provide advice, education, support and counseling about physical activity/exercise needs.;Develop an individualized exercise prescription for aerobic and resistive training based on initial evaluation findings, risk stratification, comorbidities and participant's personal goals.       Expected Outcomes Short Term: Increase workloads from initial exercise prescription for resistance, speed, and METs.;Short Term: Perform resistance training exercises routinely during rehab and add in resistance training at home;Long Term: Improve cardiorespiratory fitness, muscular endurance and strength as measured by increased METs and functional capacity ( )       Able to understand and use rate of perceived exertion (RPE) scale Yes       Intervention Provide education and explanation on how to use RPE scale       Expected Outcomes Short Term: Able to use RPE daily in rehab to express subjective intensity level;Long Term:  Able to use RPE to guide intensity level when exercising independently       Knowledge and understanding of Target Heart Rate Range (THRR) Yes       Intervention Provide education and explanation of THRR including how the numbers were predicted and where they are located for reference       Expected Outcomes Short Term: Able to state/look up THRR;Long Term: Able to use THRR to govern intensity when exercising independently;Short Term: Able to use daily as guideline for intensity in rehab       Able to check pulse independently Yes       Intervention Provide education and demonstration on how to check pulse in carotid and radial arteries.;Review  the importance of being  able to check your own pulse for safety during independent exercise       Expected Outcomes Short Term: Able to explain why pulse checking is important during independent exercise;Long Term: Able to check pulse independently and accurately       Understanding of Exercise Prescription Yes       Intervention Provide education, explanation, and written materials on patient's individual exercise prescription       Expected Outcomes Short Term: Able to explain program exercise prescription;Long Term: Able to explain home exercise prescription to exercise independently                Exercise Goals Re-Evaluation :  Exercise Goals Re-Evaluation     Row Name 09/29/22 0843 10/11/22 0838 11/10/22 0825         Exercise Goal Re-Evaluation   Exercise Goals Review Increase Physical Activity;Understanding of Exercise Prescription;Increase Strength and Stamina;Knowledge and understanding of Target Heart Rate Range (THRR);Able to understand and use rate of perceived exertion (RPE) scale Increase Physical Activity;Understanding of Exercise Prescription;Increase Strength and Stamina;Knowledge and understanding of Target Heart Rate Range (THRR);Able to understand and use rate of perceived exertion (RPE) scale Increase Physical Activity;Increase Strength and Stamina;Able to understand and use rate of perceived exertion (RPE) scale;Knowledge and understanding of Target Heart Rate Range (THRR);Understanding of Exercise Prescription     Comments Pt first day in the CRP2 [facility] Program. Pt tolerated exercise well with an average MET level of 4.8. Pt is off to a great start and is learning his THRR, RPE and ExRx Pt first day in the CRP2 program. Pt tolerated exercise well with an average MET level of 5.13. Pt feels good about his goals, he is increasing MET's, strength and stamina. Pt is excited about doing more for home ExRx. He is already walking, but he will add in some sternal precautions core exercises and hand  weights for 30-45 mins 5-7 days a week. Pt will let me know if the new exercises interfere with chronic back issues. Pt is feeling good about his progress so far. Reviewed METs and goals with pt toda.y Pt is tolerating exercise well with an avg MET levle of 6.1. Pt continues to progress within program well and will increase [employer] next session. Pt feels good about his goals and has been doing core exercises and walking at home. His walking distance has improved to 3 miles a day and he feels as if his stamina has greatly increased.     Expected Outcomes Will continue to monitor pt and progress work loads as tolerated without sign or symptom Pt will exercise on his own and gain strength. Will continue to monitor pt and progress work loads as tolerated without sign or symptom Will continue to progress workloads as tolerated without s/sx.              Discharge Exercise Prescription (Final Exercise Prescription Changes):  Exercise Prescription Changes - 11/10/22 0800       Response to Exercise   Blood Pressure (Admit) 100/62    Blood Pressure (Exercise) 118/70    Blood Pressure (Exit) 102/64    Heart Rate (Admit) 83 bpm    Heart Rate (Exercise) 107 bpm    Heart Rate (Exit) 77 bpm    Rating of Perceived Exertion (Exercise) 12    Perceived Dyspnea (Exercise) 0    Symptoms none    Comments Revd METs and goals    Duration Continue with 30 min of  aerobic exercise without signs/symptoms of physical distress.    Intensity THRR unchanged      Progression   Progression Continue to progress workloads to maintain intensity without signs/symptoms of physical distress.    Average METs 6.1      Resistance Training   Training Prescription No      Recumbant Elliptical   Level 3    RPM 88    Watts 124    Minutes 15    METs 7.9      Track   Laps 25    Minutes 15    METs 4.19      Home Exercise Plan   Plans to continue exercise at Home (comment)    Frequency Add 3 additional days to  program exercise sessions.    Initial Home Exercises Provided 10/11/22             Nutrition:  Target Goals: Understanding of nutrition guidelines, daily intake of sodium 1500mg , cholesterol 200mg , calories 30% from fat and 7% or less from saturated fats, daily to have 5 or more servings of fruits and vegetables.  Biometrics:  Pre Biometrics - 09/21/22 0906       Pre Biometrics   Waist Circumference 32 inches    Hip Circumference 37 inches    Waist to Hip Ratio 0.86 %    Triceps Skinfold 9 mm    % Body Fat 19.9 %    Grip Strength 21 kg    Flexibility 0 in   knees bent   Single Leg Stand 30 seconds              Nutrition Therapy Plan and Nutrition Goals:  Nutrition Therapy & Goals - 10/29/22 1323       Nutrition Therapy   Diet Heart Healthy Diet    Drug/Food Interactions Statins/Certain Fruits      Personal Nutrition Goals   Nutrition Goal Patient to identify strategies for reducing cardiovascular risk by attending the [facility] education and nutrition series weekly.    Personal Goal #2 Patient to improve diet quality by using the plate method as a guide for meal planning to include lean protein/plant protein, fruits, vegetables, whole grains, nonfat dairy as part of a well-balanced diet.    Comments Goals in action. Nadine Counts continues to attend the [employer] and nutrition series regularly. He is down ~6# since starting with our program. Patient will benefit from participation in intensive cardiac rehab for nutrition, exercise, and lifestyle modification.      Intervention Plan   Intervention Prescribe, educate and counsel regarding individualized specific dietary modifications aiming towards targeted core components such as weight, hypertension, lipid management, diabetes, heart failure and other comorbidities.;Nutrition handout(s) given to patient.    Expected Outcomes Short Term Goal: Understand basic principles of dietary content, such as calories, fat, sodium,  cholesterol and nutrients.;Long Term Goal: Adherence to prescribed nutrition plan.             Nutrition Assessments:  Nutrition Assessments - 09/30/22 0921       Rate Your Plate Scores   Pre Score 70            MEDIFICTS Score Key: ?70 Need to make dietary changes  40-70 Heart Healthy Diet ? 40 Therapeutic Level Cholesterol Diet   Flowsheet Row INTENSIVE CARDIAC REHAB from 09/29/2022 in [facility], [facility]  Picture Your Plate Total Score on Admission 70      Picture Your Plate Scores: <  40 Unhealthy dietary pattern with much room for improvement. 41-50 Dietary pattern unlikely to meet recommendations for good health and room for improvement. 51-60 More healthful dietary pattern, with some room for improvement.  >60 Healthy dietary pattern, although there may be some specific behaviors that could be improved.    Nutrition Goals Re-Evaluation:  Nutrition Goals Re-Evaluation     Row Name 09/29/22 1043 10/29/22 1323           Goals   Current Weight 138 lb 0.1 oz (62.6 kg) 134 lb 7.7 oz (61 kg)      Comment Lipids WNL- LDL 85, A1c WNL, Cr 1.36, GFR 54 lipids WNL- LDL 76, Hepatic panel WNL      Expected Outcome Patient will benefit from participation in intensive cardiac rehab for nutrition, exercise, and lifestyle modification. Goals in action. Nadine Counts continues to attend the [employer] and nutrition series regularly. He is down ~6# since starting with our program. Patient will benefit from participation in intensive cardiac rehab for nutrition, exercise, and lifestyle modification.               Nutrition Goals Re-Evaluation:  Nutrition Goals Re-Evaluation     Row Name 09/29/22 1043 10/29/22 1323           Goals   Current Weight 138 lb 0.1 oz (62.6 kg) 134 lb 7.7 oz (61 kg)      Comment Lipids WNL- LDL 85, A1c WNL, Cr 1.36, GFR 54 lipids WNL- LDL 76, Hepatic panel WNL      Expected Outcome Patient will  benefit from participation in intensive cardiac rehab for nutrition, exercise, and lifestyle modification. Goals in action. Nadine Counts continues to attend the [employer] and nutrition series regularly. He is down ~6# since starting with our program. Patient will benefit from participation in intensive cardiac rehab for nutrition, exercise, and lifestyle modification.               Nutrition Goals Discharge (Final Nutrition Goals Re-Evaluation):  Nutrition Goals Re-Evaluation - 10/29/22 1323       Goals   Current Weight 134 lb 7.7 oz (61 kg)    Comment lipids WNL- LDL 76, Hepatic panel WNL    Expected Outcome Goals in action. Nadine Counts continues to attend the [employer] and nutrition series regularly. He is down ~6# since starting with our program. Patient will benefit from participation in intensive cardiac rehab for nutrition, exercise, and lifestyle modification.             Psychosocial: Target Goals: Acknowledge presence or absence of significant depression and/or stress, maximize coping skills, provide positive support system. Participant is able to verbalize types and ability to use techniques and skills needed for reducing stress and depression.  Initial Review & Psychosocial Screening:  Initial Psych Review & Screening - 09/21/22 1051       Initial Review   Current issues with None Identified      Family Dynamics   Good Support System? Yes   Nadine Counts has his wife, daughter and grandchildren for support.     Barriers   Psychosocial barriers to participate in program There are no identifiable barriers or psychosocial needs.      Screening Interventions   Interventions Encouraged to exercise    Expected Outcomes Short Term goal: Identification and review with participant of any Quality of Life or Depression concerns found by scoring the questionnaire.             Quality of Life Scores:  Quality of Life - 09/21/22 1138       Quality of Life   Select Quality of  Life      Quality of Life Scores   Health/Function Pre 13 %    Socioeconomic Pre 23 %    Psych/Spiritual Pre 21.86 %    Family Pre 30 %    GLOBAL Pre 19.49 %            Scores of 19 and below usually indicate a poorer quality of life in these areas.  A difference of  2-3 points is a clinically meaningful difference.  A difference of 2-3 points in the total score of the Quality of Life Index has been associated with significant improvement in overall quality of life, self-image, physical symptoms, and general health in studies assessing change in quality of life.  PHQ-9: Review Flowsheet  More data exists      09/21/2022 09/06/2022 07/29/2022 03/18/2022 02/23/2021  Depression screen PHQ 2/9  Decreased Interest 0 0 0 0 0  Down, Depressed, Hopeless 0 0 0 0 0  PHQ - 2 Score 0 0 0 0 0  Altered sleeping 3 - 2 - -  Tired, decreased energy 1 - 1 - -  Change in appetite 0 - 0 - -  Feeling bad or failure about yourself  0 - 0 - -  Trouble concentrating 0 - 1 - -  Moving slowly or fidgety/restless 0 - 0 - -  Suicidal thoughts 0 - 0 - -  PHQ-9 Score 4 - 4 - -  Difficult doing work/chores Not difficult at all - Not difficult at all - -   Interpretation of Total Score  Total Score Depression Severity:  1-4 = Minimal depression, 5-9 = Mild depression, 10-14 = Moderate depression, 15-19 = Moderately severe depression, 20-27 = Severe depression   Psychosocial Evaluation and Intervention:   Psychosocial Re-Evaluation:  Psychosocial Re-Evaluation     Row Name 09/29/22 1457 10/22/22 0839 11/19/22 0840 11/22/22 1059       Psychosocial Re-Evaluation   Current issues with None Identified None Identified None Identified None Identified    Comments Denies being depressed but endorses feling tired and having low energy will review quality of life in the upcming week Denies being depressed but endorses feling tired and having low energy will review quality of life in the upcming week All good. Has  support. No changes, no barriers to cardiac rehab particpation. All good. Has support. No changes, no barriers to cardiac rehab particpation.    Interventions Encouraged to attend Cardiac Rehabilitation for the exercise;Relaxation education;Stress management education Encouraged to attend Cardiac Rehabilitation for the exercise;Relaxation education;Stress management education Encouraged to attend Cardiac Rehabilitation for the exercise;Relaxation education;Stress management education Encouraged to attend Cardiac Rehabilitation for the exercise;Relaxation education;Stress management education    Continue Psychosocial Services  Follow up required by staff Follow up required by staff Follow up required by staff Follow up required by staff             Psychosocial Discharge (Final Psychosocial Re-Evaluation):  Psychosocial Re-Evaluation - 11/22/22 1059       Psychosocial Re-Evaluation   Current issues with None Identified    Comments All good. Has support. No changes, no barriers to cardiac rehab particpation.    Interventions Encouraged to attend Cardiac Rehabilitation for the exercise;Relaxation education;Stress management education    Continue Psychosocial Services  Follow up required by staff  Vocational Rehabilitation: Provide vocational rehab assistance to qualifying candidates.   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 Participant will state understanding/return demonstration of topics presented.    Education     Row Name 09/29/22 1000     Education   Cardiac Education Topics Pritikin   Hydrologist   Educator Dietitian   Weekly Topic International Cuisine- Spotlight on the [facility] Zones   Instruction Review Code 1- Verbalizes Understanding   Class Start Time [phone]   Class Stop Time [phone]   Class Time Calculation (min) 32 min    Row Name 10/01/22 0900     Education   Cardiac Education Topics Pritikin   Select Core Videos     Core Videos   Educator Exercise Physiologist   Select Exercise Education   Exercise Education Improving Performance   Instruction Review Code 1- Verbalizes Understanding   Class Start Time [phone]   Class Stop Time 0855   Class Time Calculation (min) 43 min    Row Name 10/06/22 0800     Education   Cardiac Education Topics Pritikin   Secondary school teacher School   Educator Dietitian   Weekly Topic Simple Sides and Sauces   Instruction Review Code 1- Verbalizes Understanding   Class Start Time 0815   Class Stop Time 0850   Class Time Calculation (min) 35 min    Row Name 10/08/22 0900     Education   Cardiac Education Topics Pritikin   Select Core Videos     Core Videos   Educator Exercise Physiologist   Select Psychosocial   Psychosocial How Our Thoughts Can Heal Our Hearts   Instruction Review Code 1- Verbalizes Understanding   Class Start Time 0815   Class Stop Time 0855   Class Time Calculation (min) 40 min    Row Name 10/11/22 1000     Education   Cardiac Education Topics Pritikin   Geographical information systems officer Psychosocial   Psychosocial Workshop From Head to Heart: The Power of a Healthy Outlook   Instruction Review Code 1- Verbalizes Understanding   Class Start Time 0815   Class Stop Time 0903   Class Time Calculation (min) 48 min    Row Name 10/13/22 0900     Education   Cardiac Education Topics Pritikin   Secondary school teacher School   Educator Dietitian   Weekly Topic Powerhouse Plant-Based Proteins   Instruction Review  Code 1- Verbalizes Understanding   Class Start Time [phone]   Class Stop Time 0845   Class Time Calculation (min) 35 min    Row Name 10/15/22 0900     Education   Cardiac Education Topics Pritikin   Psychologist, forensic General Education   General Education Hypertension and Heart Disease   Instruction Review Code 1- Verbalizes Understanding   Class Start Time [phone]  Class Stop Time 0843   Class Time Calculation (min) 31 min    Row Name 10/22/22 1400     Education   Cardiac Education Topics Pritikin   Psychologist, forensic General Education   General Education Heart Disease Risk Reduction   Instruction Review Code 1- Verbalizes Understanding   Class Start Time 0810   Class Stop Time 0849   Class Time Calculation (min) 39 min    Row Name 10/29/22 0900     Education   Cardiac Education Topics Pritikin   Select Core Videos     Core Videos   Educator Dietitian   Select Nutrition   Nutrition Overview of the Pritikin Eating Plan   Instruction Review Code 1- Verbalizes Understanding   Class Start Time 0813   Class Stop Time 0852   Class Time Calculation (min) 39 min    Row Name 11/01/22 1000     Education   Cardiac Education Topics Pritikin   Select Workshops     Workshops   Educator Exercise Physiologist   Select Psychosocial   Psychosocial Workshop Recognizing and Reducing Stress   Instruction Review Code 1- Verbalizes Understanding   Class Start Time [phone]   Class Stop Time 0847   Class Time Calculation (min) 35 min    Row Name 11/03/22 1000     Education   Cardiac Education Topics Pritikin   Secondary school teacher School   Educator Dietitian   Weekly Topic Personalizing Your Pritikin Plate   Instruction Review Code 1- Verbalizes Understanding   Class Start Time 0815   Class Stop Time [id number]   Class Time Calculation (min) 37 min     Row Name 11/05/22 0900     Education   Cardiac Education Topics Pritikin   Select Core Videos     Core Videos   Educator Exercise Physiologist   Select Psychosocial   Psychosocial Healthy Minds, Bodies, Hearts   Instruction Review Code 1- Verbalizes Understanding   Class Start Time 0815   Class Stop Time 0850   Class Time Calculation (min) 35 min    Row Name 11/08/22 0900     Education   Cardiac Education Topics Pritikin   Glass blower/designer Nutrition   Nutrition Workshop Label Reading   Instruction Review Code 1- Verbalizes Understanding   Class Start Time 0815   Class Stop Time 0858   Class Time Calculation (min) 43 min    Row Name 11/10/22 1400     Education   Cardiac Education Topics Pritikin   Secondary school teacher School   Educator Dietitian   Weekly Topic [employer] Desserts   Instruction Review Code 1- Verbalizes Understanding   Class Start Time [phone]   Class Stop Time 0845   Class Time Calculation (min) 35 min    Row Name 11/12/22 0900     Education   Cardiac Education Topics Pritikin   Select Core Videos     Core Videos   Educator Dietitian   Select Nutrition   Nutrition Other  label reading   Instruction Review Code 1- Verbalizes Understanding   Class Start Time 0810   Class Stop Time 0852   Class Time Calculation (min) 42 min    Row Name 11/15/22 0900     Education   Cardiac Education  Topics Pritikin   Geographical information systems officer Exercise   Exercise Workshop Exercise Basics: Building Your Action Plan   Instruction Review Code 1- Verbalizes Understanding   Class Start Time 0808   Class Stop Time 0854   Class Time Calculation (min) 46 min    Row Name 11/17/22 0900     Education   Cardiac Education Topics Pritikin   Secondary school teacher School   Educator Dietitian   Weekly Topic Tasty Appetizers and Snacks    Instruction Review Code 1- Verbalizes Understanding   Class Start Time [phone]   Class Stop Time 0845   Class Time Calculation (min) 31 min    Row Name 11/22/22 0900     Education   Cardiac Education Topics Pritikin   Select Core Videos     Core Videos   Educator Dietitian   Nutrition Calorie Density   Instruction Review Code 1- Verbalizes Understanding   Class Start Time 0815   Class Stop Time 0857   Class Time Calculation (min) 42 min            Core Videos: Exercise    Move It!  Clinical staff conducted group or individual video education with verbal and written material and guidebook.  Patient learns the recommended Pritikin exercise program. Exercise with the goal of living a long, healthy life. Some of the health benefits of exercise include controlled diabetes, healthier blood pressure levels, improved cholesterol levels, improved heart and lung capacity, improved sleep, and better body composition. Everyone should speak with their doctor before starting or changing an exercise routine.  Biomechanical Limitations Clinical staff conducted group or individual video education with verbal and written material and guidebook.  Patient learns how biomechanical limitations can impact exercise and how we can mitigate and possibly overcome limitations to have an impactful and balanced exercise routine.  Body Composition Clinical staff conducted group or individual video education with verbal and written material and guidebook.  Patient learns that body composition (ratio of muscle mass to fat mass) is a key component to assessing overall fitness, rather than body weight alone. Increased fat mass, especially visceral belly fat, can put [place] at increased risk for metabolic syndrome, type 2 diabetes, heart disease, and even death. It is recommended to combine diet and exercise (cardiovascular and resistance training) to improve your body composition. Seek guidance from your physician and  exercise physiologist before implementing an exercise routine.  Exercise Action Plan Clinical staff conducted group or individual video education with verbal and written material and guidebook.  Patient learns the recommended strategies to achieve and enjoy long-term exercise adherence, including variety, self-motivation, self-efficacy, and positive decision making. Benefits of exercise include fitness, good health, weight management, more energy, better sleep, less stress, and overall well-being.  Medical   Heart Disease Risk Reduction Clinical staff conducted group or individual video education with verbal and written material and guidebook.  Patient learns our heart is our most vital organ as it circulates oxygen, nutrients, white blood cells, and hormones throughout the entire body, and carries waste away. Data supports a plant-based eating plan like the Pritikin Program for its effectiveness in slowing progression of and reversing heart disease. The video provides a number of recommendations to address heart disease.   Metabolic Syndrome and Belly Fat  Clinical staff conducted group or individual video education with verbal and written material and guidebook.  Patient learns what metabolic  syndrome is, how it leads to heart disease, and how one can reverse it and keep it from coming back. You have metabolic syndrome if you have 3 of the following 5 criteria: abdominal obesity, high blood pressure, high triglycerides, low HDL cholesterol, and high blood sugar.  Hypertension and Heart Disease Clinical staff conducted group or individual video education with verbal and written material and guidebook.  Patient learns that high blood pressure, or hypertension, is very common in the [place]. Hypertension is largely due to excessive salt intake, but other important risk factors include being overweight, physical inactivity, drinking too much alcohol, smoking, and not eating enough potassium from  fruits and vegetables. High blood pressure is a leading risk factor for heart attack, stroke, congestive heart failure, dementia, kidney failure, and premature death. Long-term effects of excessive salt intake include stiffening of the arteries and thickening of heart muscle and organ damage. Recommendations include ways to reduce hypertension and the risk of heart disease.  Diseases of Our Time - Focusing on Diabetes Clinical staff conducted group or individual video education with verbal and written material and guidebook.  Patient learns why the best way to stop diseases of our time is prevention, through food and other lifestyle changes. Medicine (such as prescription pills and surgeries) is often only a Band-Aid on the problem, not a long-term solution. Most common diseases of our time include obesity, type 2 diabetes, hypertension, heart disease, and cancer. The Pritikin Program is recommended and has been proven to help reduce, reverse, and/or prevent the damaging effects of metabolic syndrome.  Nutrition   Overview of the Pritikin Eating Plan  Clinical staff conducted group or individual video education with verbal and written material and guidebook.  Patient learns about the Pritikin Eating Plan for disease risk reduction. The Pritikin Eating Plan emphasizes a wide variety of unrefined, minimally-processed carbohydrates, like fruits, vegetables, whole grains, and legumes. Go, Caution, and Stop food choices are explained. Plant-based and lean animal proteins are emphasized. Rationale provided for low sodium intake for blood pressure control, low added sugars for blood sugar stabilization, and low added fats and oils for coronary artery disease risk reduction and weight management.  Calorie Density  Clinical staff conducted group or individual video education with verbal and written material and guidebook.  Patient learns about calorie density and how it impacts the Pritikin Eating Plan. Knowing  the characteristics of the food you choose will help you decide whether those foods will lead to weight gain or weight loss, and whether you want to consume more or less of them. Weight loss is usually a side effect of the Pritikin Eating Plan because of its focus on low calorie-dense foods.  Label Reading  Clinical staff conducted group or individual video education with verbal and written material and guidebook.  Patient learns about the Pritikin recommended label reading guidelines and corresponding recommendations regarding calorie density, added sugars, sodium content, and whole grains.  Dining Out - Part 1  Clinical staff conducted group or individual video education with verbal and written material and guidebook.  Patient learns that restaurant meals can be sabotaging because they can be so high in calories, fat, sodium, and/or sugar. Patient learns recommended strategies on how to positively address this and avoid unhealthy pitfalls.  Facts on Fats  Clinical staff conducted group or individual video education with verbal and written material and guidebook.  Patient learns that lifestyle modifications can be just as effective, if not more so, as many medications for lowering  your risk of heart disease. A Pritikin lifestyle can help to reduce your risk of inflammation and atherosclerosis (cholesterol build-up, or plaque, in the artery walls). Lifestyle interventions such as dietary choices and physical activity address the cause of atherosclerosis. A review of the types of fats and their impact on blood cholesterol levels, along with dietary recommendations to reduce fat intake is also included.  Nutrition Action Plan  Clinical staff conducted group or individual video education with verbal and written material and guidebook.  Patient learns how to incorporate Pritikin recommendations into their lifestyle. Recommendations include planning and keeping personal health goals in mind as an  important part of their success.  Healthy Mind-Set    Healthy Minds, Bodies, Hearts  Clinical staff conducted group or individual video education with verbal and written material and guidebook.  Patient learns how to identify when they are stressed. Video will discuss the impact of that stress, as well as the many benefits of stress management. Patient will also be introduced to stress management techniques. The way we think, act, and feel has an impact on our hearts.  How Our Thoughts Can Heal Our Hearts  Clinical staff conducted group or individual video education with verbal and written material and guidebook.  Patient learns that negative thoughts can cause depression and anxiety. This can result in negative lifestyle behavior and serious health problems. Cognitive behavioral therapy is an effective method to help control our thoughts in order to change and improve our emotional outlook.  Additional Videos:  Exercise    Improving Performance  Clinical staff conducted group or individual video education with verbal and written material and guidebook.  Patient learns to use a non-linear approach by alternating intensity levels and lengths of time spent exercising to help burn more calories and lose more body fat. Cardiovascular exercise helps improve heart health, metabolism, hormonal balance, blood sugar control, and recovery from fatigue. Resistance training improves strength, endurance, balance, coordination, reaction time, metabolism, and muscle mass. Flexibility exercise improves circulation, posture, and balance. Seek guidance from your physician and exercise physiologist before implementing an exercise routine and learn your capabilities and proper form for all exercise.  Introduction to Yoga  Clinical staff conducted group or individual video education with verbal and written material and guidebook.  Patient learns about yoga, a discipline of the coming together of mind, breath, and  body. The benefits of yoga include improved flexibility, improved range of motion, better posture and core strength, increased lung function, weight loss, and positive self-image. Yoga's heart health benefits include lowered blood pressure, healthier heart rate, decreased cholesterol and triglyceride levels, improved immune function, and reduced stress. Seek guidance from your physician and exercise physiologist before implementing an exercise routine and learn your capabilities and proper form for all exercise.  Medical   Aging: Enhancing Your Quality of Life  Clinical staff conducted group or individual video education with verbal and written material and guidebook.  Patient learns key strategies and recommendations to stay in good physical health and enhance quality of life, such as prevention strategies, having an advocate, securing a Health Care Proxy and Power of Attorney, and keeping a list of medications and system for tracking them. It also discusses how to avoid risk for bone loss.  Biology of Weight Control  Clinical staff conducted group or individual video education with verbal and written material and guidebook.  Patient learns that weight gain occurs because we consume more calories than we burn (eating more, moving less). Even if your body  weight is normal, you may have higher ratios of fat compared to muscle mass. Too much body fat puts you at increased risk for cardiovascular disease, heart attack, stroke, type 2 diabetes, and obesity-related cancers. In addition to exercise, following the Pritikin Eating Plan can help reduce your risk.  Decoding Lab Results  Clinical staff conducted group or individual video education with verbal and written material and guidebook.  Patient learns that lab test reflects one measurement whose values change over time and are influenced by many factors, including medication, stress, sleep, exercise, food, hydration, pre-existing medical conditions, and  more. It is recommended to use the knowledge from this video to become more involved with your lab results and evaluate your numbers to speak with your doctor.   Diseases of Our Time - Overview  Clinical staff conducted group or individual video education with verbal and written material and guidebook.  Patient learns that according to the CDC, 50% to 70% of chronic diseases (such as obesity, type 2 diabetes, elevated lipids, hypertension, and heart disease) are avoidable through lifestyle improvements including healthier food choices, listening to satiety cues, and increased physical activity.  Sleep Disorders Clinical staff conducted group or individual video education with verbal and written material and guidebook.  Patient learns how good quality and duration of sleep are important to overall health and well-being. Patient also learns about sleep disorders and how they impact health along with recommendations to address them, including discussing with a physician.  Nutrition  Dining Out - Part 2 Clinical staff conducted group or individual video education with verbal and written material and guidebook.  Patient learns how to plan ahead and communicate in order to maximize their dining experience in a healthy and nutritious manner. Included are recommended food choices based on the type of restaurant the patient is visiting.   Fueling a Banker conducted group or individual video education with verbal and written material and guidebook.  There is a strong connection between our food choices and our health. Diseases like obesity and type 2 diabetes are very prevalent and are in large-part due to lifestyle choices. The Pritikin Eating Plan provides plenty of food and hunger-curbing satisfaction. It is easy to follow, affordable, and helps reduce health risks.  Menu Workshop  Clinical staff conducted group or individual video education with verbal and written material and  guidebook.  Patient learns that restaurant meals can sabotage health goals because they are often packed with calories, fat, sodium, and sugar. Recommendations include strategies to plan ahead and to communicate with the manager, chef, or server to help order a healthier meal.  Planning Your Eating Strategy  Clinical staff conducted group or individual video education with verbal and written material and guidebook.  Patient learns about the Pritikin Eating Plan and its benefit of reducing the risk of disease. The Pritikin Eating Plan does not focus on calories. Instead, it emphasizes high-quality, nutrient-rich foods. By knowing the characteristics of the foods, we choose, we can determine their calorie density and make informed decisions.  Targeting Your Nutrition Priorities  Clinical staff conducted group or individual video education with verbal and written material and guidebook.  Patient learns that lifestyle habits have a tremendous impact on disease risk and progression. This video provides eating and physical activity recommendations based on your personal health goals, such as reducing LDL cholesterol, losing weight, preventing or controlling type 2 diabetes, and reducing high blood pressure.  Vitamins and Minerals  Clinical staff conducted group or  individual video education with verbal and written material and guidebook.  Patient learns different ways to obtain key vitamins and minerals, including through a recommended healthy diet. It is important to discuss all supplements you take with your doctor.   Healthy Mind-Set    Smoking Cessation  Clinical staff conducted group or individual video education with verbal and written material and guidebook.  Patient learns that cigarette smoking and tobacco addiction pose a serious health risk which affects millions of people. Stopping smoking will significantly reduce the risk of heart disease, lung disease, and many forms of cancer.  Recommended strategies for quitting are covered, including working with your doctor to develop a successful plan.  Culinary   Becoming a Set designer conducted group or individual video education with verbal and written material and guidebook.  Patient learns that cooking at home can be healthy, cost-effective, quick, and puts them in control. Keys to cooking healthy recipes will include looking at your recipe, assessing your equipment needs, planning ahead, making it simple, choosing cost-effective seasonal ingredients, and limiting the use of added fats, salts, and sugars.  Cooking - Breakfast and Snacks  Clinical staff conducted group or individual video education with verbal and written material and guidebook.  Patient learns how important breakfast is to satiety and nutrition through the entire day. Recommendations include key foods to eat during breakfast to help stabilize blood sugar levels and to prevent overeating at meals later in the day. Planning ahead is also a key component.  Cooking - Educational psychologist conducted group or individual video education with verbal and written material and guidebook.  Patient learns eating strategies to improve overall health, including an approach to cook more at home. Recommendations include thinking of animal protein as a side on your plate rather than center stage and focusing instead on lower calorie dense options like vegetables, fruits, whole grains, and plant-based proteins, such as beans. Making sauces in large quantities to freeze for later and leaving the skin on your vegetables are also recommended to maximize your experience.  Cooking - Healthy Salads and Dressing Clinical staff conducted group or individual video education with verbal and written material and guidebook.  Patient learns that vegetables, fruits, whole grains, and legumes are the foundations of the Pritikin Eating Plan. Recommendations include how  to incorporate each of these in flavorful and healthy salads, and how to create homemade salad dressings. Proper handling of ingredients is also covered. Cooking - Soups and [employer] - Soups and Desserts Clinical staff conducted group or individual video education with verbal and written material and guidebook.  Patient learns that Pritikin soups and desserts make for easy, nutritious, and delicious snacks and meal components that are low in sodium, fat, sugar, and calorie density, while high in vitamins, minerals, and filling fiber. Recommendations include simple and healthy ideas for soups and desserts.   Overview     The Pritikin Solution Program Overview Clinical staff conducted group or individual video education with verbal and written material and guidebook.  Patient learns that the results of the Pritikin Program have been documented in more than 100 articles published in peer-reviewed journals, and the benefits include reducing risk factors for (and, in some cases, even reversing) high cholesterol, high blood pressure, type 2 diabetes, obesity, and more! An overview of the three key pillars of the Pritikin Program will be covered: eating well, doing regular exercise, and having a healthy mind-set.  WORKSHOPS  Exercise: Exercise  Basics: Building Your Action Plan Clinical staff led group instruction and group discussion with PowerPoint presentation and patient guidebook. To enhance the learning environment the use of posters, models and videos may be added. At the conclusion of this workshop, patients will comprehend the difference between physical activity and exercise, as well as the benefits of incorporating both, into their routine. Patients will understand the FITT (Frequency, Intensity, Time, and Type) principle and how to use it to build an exercise action plan. In addition, safety concerns and other considerations for exercise and cardiac rehab will be addressed by the  presenter. The purpose of this lesson is to promote a comprehensive and effective weekly exercise routine in order to improve patients' overall level of fitness.   Managing Heart Disease: Your Path to a Healthier Heart Clinical staff led group instruction and group discussion with PowerPoint presentation and patient guidebook. To enhance the learning environment the use of posters, models and videos may be added.At the conclusion of this workshop, patients will understand the anatomy and physiology of the heart. Additionally, they will understand how Pritikin's three pillars impact the risk factors, the progression, and the management of heart disease.  The purpose of this lesson is to provide a high-level overview of the heart, heart disease, and how the Pritikin lifestyle positively impacts risk factors.  Exercise Biomechanics Clinical staff led group instruction and group discussion with PowerPoint presentation and patient guidebook. To enhance the learning environment the use of posters, models and videos may be added. Patients will learn how the structural parts of their bodies function and how these functions impact their daily activities, movement, and exercise. Patients will learn how to promote a neutral spine, learn how to manage pain, and identify ways to improve their physical movement in order to promote healthy living. The purpose of this lesson is to expose patients to common physical limitations that impact physical activity. Participants will learn practical ways to adapt and manage aches and pains, and to minimize their effect on regular exercise. Patients will learn how to maintain good posture while sitting, walking, and lifting.  Balance Training and Fall Prevention  Clinical staff led group instruction and group discussion with PowerPoint presentation and patient guidebook. To enhance the learning environment the use of posters, models and videos may be added. At the  conclusion of this workshop, patients will understand the importance of their sensorimotor skills (vision, proprioception, and the vestibular system) in maintaining their ability to balance as they age. Patients will apply a variety of balancing exercises that are appropriate for their current level of function. Patients will understand the common causes for poor balance, possible solutions to these problems, and ways to modify their physical environment in order to minimize their fall risk. The purpose of this lesson is to teach patients about the importance of maintaining balance as they age and ways to minimize their risk of falling.  WORKSHOPS   Nutrition:  Fueling a Ship broker led group instruction and group discussion with PowerPoint presentation and patient guidebook. To enhance the learning environment the use of posters, models and videos may be added. Patients will review the foundational principles of the Pritikin Eating Plan and understand what constitutes a serving size in each of the food groups. Patients will also learn Pritikin-friendly foods that are better choices when away from home and review make-ahead meal and snack options. Calorie density will be reviewed and applied to three nutrition priorities: weight maintenance, weight loss, and weight gain. The  purpose of this lesson is to reinforce (in a group setting) the key concepts around what patients are recommended to eat and how to apply these guidelines when away from home by planning and selecting Pritikin-friendly options. Patients will understand how calorie density may be adjusted for different weight management goals.  Mindful Eating  Clinical staff led group instruction and group discussion with PowerPoint presentation and patient guidebook. To enhance the learning environment the use of posters, models and videos may be added. Patients will briefly review the concepts of the Pritikin Eating Plan and the  importance of low-calorie dense foods. The concept of mindful eating will be introduced as well as the importance of paying attention to internal hunger signals. Triggers for non-hunger eating and techniques for dealing with triggers will be explored. The purpose of this lesson is to provide patients with the opportunity to review the basic principles of the Pritikin Eating Plan, discuss the value of eating mindfully and how to measure internal cues of hunger and fullness using the Hunger Scale. Patients will also discuss reasons for non-hunger eating and learn strategies to use for controlling emotional eating.  Targeting Your Nutrition Priorities Clinical staff led group instruction and group discussion with PowerPoint presentation and patient guidebook. To enhance the learning environment the use of posters, models and videos may be added. Patients will learn how to determine their genetic susceptibility to disease by reviewing their family history. Patients will gain insight into the importance of diet as part of an overall healthy lifestyle in mitigating the impact of genetics and other environmental insults. The purpose of this lesson is to provide patients with the opportunity to assess their personal nutrition priorities by looking at their family history, their own health history and current risk factors. Patients will also be able to discuss ways of prioritizing and modifying the Pritikin Eating Plan for their highest risk areas  Menu  Clinical staff led group instruction and group discussion with PowerPoint presentation and patient guidebook. To enhance the learning environment the use of posters, models and videos may be added. Using menus brought in from [employer], or printed from [employer], patients will apply the Pritikin dining out guidelines that were presented in the [employer] video. Patients will also be able to practice these guidelines in a variety of  provided scenarios. The purpose of this lesson is to provide patients with the opportunity to practice hands-on learning of the Pritikin Dining Out guidelines with actual menus and practice scenarios.  Label Reading Clinical staff led group instruction and group discussion with PowerPoint presentation and patient guidebook. To enhance the learning environment the use of posters, models and videos may be added. Patients will review and discuss the Pritikin label reading guidelines presented in Pritikin's Label Reading Educational series video. Using fool labels brought in from local grocery stores and markets, patients will apply the label reading guidelines and determine if the packaged food meet the Pritikin guidelines. The purpose of this lesson is to provide patients with the opportunity to review, discuss, and practice hands-on learning of the Pritikin Label Reading guidelines with actual packaged food labels. Cooking School  Pritikin's [employer] are designed to teach patients ways to prepare quick, simple, and affordable recipes at home. The importance of nutrition's role in chronic disease risk reduction is reflected in its emphasis in the overall Pritikin program. By learning how to prepare essential core Pritikin Eating Plan recipes, patients will increase control over what they eat; be  able to customize the flavor of foods without the use of added salt, sugar, or fat; and improve the quality of the food they consume. By learning a set of core recipes which are easily assembled, quickly prepared, and affordable, patients are more likely to prepare more healthy foods at home. These workshops focus on convenient breakfasts, simple entres, side dishes, and desserts which can be prepared with minimal effort and are consistent with nutrition recommendations for cardiovascular risk reduction. Cooking [employer] are taught by a Armed forces logistics/support/administrative officer (RD) who has been trained by the  [employer]. The chef or RD has a clear understanding of the importance of minimizing - if not completely eliminating - added fat, sugar, and sodium in recipes. Throughout the series of Cooking School Workshop sessions, patients will learn about healthy ingredients and efficient methods of cooking to build confidence in their capability to prepare    Cooking School weekly topics:  Adding Flavor- Sodium-Free  Fast and Healthy Breakfasts  Powerhouse Plant-Based Proteins  Satisfying Salads and Dressings  Simple Sides and Sauces  International Cuisine-Spotlight on the [employer] Zones  Delicious Desserts  Savory Soups  [employer] - Meals in a Astronomer Appetizers and Snacks  Comforting Weekend Breakfasts  One-Pot Wonders   Fast Evening Meals  Landscape architect Your Pritikin Plate  WORKSHOPS   Healthy Mindset (Psychosocial):  Focused Goals, Sustainable Changes Clinical staff led group instruction and group discussion with PowerPoint presentation and patient guidebook. To enhance the learning environment the use of posters, models and videos may be added. Patients will be able to apply effective goal setting strategies to establish at least one personal goal, and then take consistent, meaningful action toward that goal. They will learn to identify common barriers to achieving personal goals and develop strategies to overcome them. Patients will also gain an understanding of how our mind-set can impact our ability to achieve goals and the importance of cultivating a positive and growth-oriented mind-set. The purpose of this lesson is to provide patients with a deeper understanding of how to set and achieve personal goals, as well as the tools and strategies needed to overcome common obstacles which may arise along the way.  From Head to Heart: The Power of a Healthy Outlook  Clinical staff led group instruction and group discussion with PowerPoint presentation  and patient guidebook. To enhance the learning environment the use of posters, models and videos may be added. Patients will be able to recognize and describe the impact of emotions and mood on physical health. They will discover the importance of self-care and explore self-care practices which may work for them. Patients will also learn how to utilize the 4 C's to cultivate a healthier outlook and better manage stress and challenges. The purpose of this lesson is to demonstrate to patients how a healthy outlook is an essential part of maintaining good health, especially as they continue their cardiac rehab journey.  Healthy Sleep for a Healthy Heart Clinical staff led group instruction and group discussion with PowerPoint presentation and patient guidebook. To enhance the learning environment the use of posters, models and videos may be added. At the conclusion of this workshop, patients will be able to demonstrate knowledge of the importance of sleep to overall health, well-being, and quality of life. They will understand the symptoms of, and treatments for, common sleep disorders. Patients will also be able to identify daytime and nighttime behaviors which impact sleep, and they will be  able to apply these tools to help manage sleep-related challenges. The purpose of this lesson is to provide patients with a general overview of sleep and outline the importance of quality sleep. Patients will learn about a few of the most common sleep disorders. Patients will also be introduced to the concept of "sleep hygiene," and discover ways to self-manage certain sleeping problems through simple daily behavior changes. Finally, the workshop will motivate patients by clarifying the links between quality sleep and their goals of heart-healthy living.   Recognizing and Reducing Stress Clinical staff led group instruction and group discussion with PowerPoint presentation and patient guidebook. To enhance the learning  environment the use of posters, models and videos may be added. At the conclusion of this workshop, patients will be able to understand the types of stress reactions, differentiate between acute and chronic stress, and recognize the impact that chronic stress has on their health. They will also be able to apply different coping mechanisms, such as reframing negative self-talk. Patients will have the opportunity to practice a variety of stress management techniques, such as deep abdominal breathing, progressive muscle relaxation, and/or guided imagery.  The purpose of this lesson is to educate patients on the role of stress in their lives and to provide healthy techniques for coping with it.  Learning Barriers/Preferences:  Learning Barriers/Preferences - 09/21/22 1202       Learning Barriers/Preferences   Learning Barriers Hearing   wears bilateral hearing aides   Learning Preferences Skilled Demonstration             Education Topics:  Knowledge Questionnaire Score:  Knowledge Questionnaire Score - 09/21/22 1139       Knowledge Questionnaire Score   Pre Score 19/24             Core Components/Risk Factors/Patient Goals at Admission:  Personal Goals and Risk Factors at Admission - 09/21/22 0947       Core Components/Risk Factors/Patient Goals on Admission   Hypertension --    Intervention --    Expected Outcomes --    Lipids Yes    Intervention Provide education and support for participant on nutrition & aerobic/resistive exercise along with prescribed medications to achieve LDL 70mg , HDL >40mg .    Expected Outcomes Short Term: Participant states understanding of desired cholesterol values and is compliant with medications prescribed. Participant is following exercise prescription and nutrition guidelines.;Long Term: Cholesterol controlled with medications as prescribed, with individualized exercise RX and with personalized nutrition plan. Value goals: LDL < 70mg , HDL > 40  mg.    Personal Goal Other Yes    Personal Goal Be able to walk 6 miles/day on the golf course. Resume exercising 40 minutes on his elliptical machine at home. Resume core muscle exercises.    Intervention Develop individualized exercise action plan including aerobic, resistance, and stretching exercise to help build strength and stamina.    Expected Outcomes Patient will be able to resume home exercise routine on his elliptical machine. Patient will be able to walk the golf course. Patient will be able to resume core strength exercises once cleared by physician to do so.             Core Components/Risk Factors/Patient Goals Review:   Goals and Risk Factor Review     Row Name 09/29/22 1500 10/22/22 0839 11/22/22 1103         Core Components/Risk Factors/Patient Goals Review   Personal Goals Review Weight Management/Obesity;Lipids Weight Management/Obesity;Lipids Weight Management/Obesity;Lipids  Review Nadine Counts started intensive cardiac rehab on 09/29/22 and did well with exercise, vital signs were stable. Systolic BP in the 90's-100's. Will continue to monitor bP Nadine Counts is doing well with exercise at  intensive cardiac rehab. Vital signs have been  stable. Nadine Counts feels good about his progress and has lost 2.6 kg since starting cardiac rehab Nadine Counts is doing well with exercise at  intensive cardiac rehab. Vital signs have been  stable. Nadine Counts feels good about his progress and has lost 2.6 kg since starting cardiac rehab. Current wt is 62 kg.     Expected Outcomes Nadine Counts will continue to participate in intensive cardiac rehab for exercise, nutrition and lifestyle modifications Nadine Counts will continue to participate in intensive cardiac rehab for exercise, nutrition and lifestyle modifications Nadine Counts will continue to participate in intensive cardiac rehab for exercise, nutrition and lifestyle modifications              Core Components/Risk Factors/Patient Goals at Discharge (Final Review):   Goals and Risk Factor  Review - 11/22/22 1103       Core Components/Risk Factors/Patient Goals Review   Personal Goals Review Weight Management/Obesity;Lipids    Review Nadine Counts is doing well with exercise at  intensive cardiac rehab. Vital signs have been  stable. Nadine Counts feels good about his progress and has lost 2.6 kg since starting cardiac rehab. Current wt is 62 kg.    Expected Outcomes Nadine Counts will continue to participate in intensive cardiac rehab for exercise, nutrition and lifestyle modifications             ITP Comments:  ITP Comments     Row Name 09/21/22 0906 09/29/22 1455 10/22/22 1610       ITP Comments Medical Director- Dr. Armanda Magic, MD. Intrduction to 30 Day ITP Review. Nadine Counts started intensive cardiac rehab on 09/29/22 and did well with exercise 30 Day ITP Review. Nadine Counts has good attendance and participation in  intensive cardiac rehab              Comments: See ITP.

## 2022-11-24 ENCOUNTER — Encounter (HOSPITAL_COMMUNITY)
Admission: RE | Admit: 2022-11-24 | Discharge: 2022-11-24 | Disposition: A | Discharge status: Home / Self Care | Payer: PPO | Source: Ambulatory Visit | Attending: Cardiology | Admitting: Cardiology

## 2022-11-24 ENCOUNTER — Encounter (HOSPITAL_COMMUNITY): Payer: PPO

## 2022-11-24 ENCOUNTER — Ambulatory Visit: Payer: Self-pay

## 2022-11-24 DIAGNOSIS — Z951 Presence of aortocoronary bypass graft: Secondary | ICD-10-CM

## 2022-11-24 DIAGNOSIS — I214 Non-ST elevation (NSTEMI) myocardial infarction: Secondary | ICD-10-CM | POA: Diagnosis not present

## 2022-11-24 DIAGNOSIS — Z952 Presence of prosthetic heart valve: Secondary | ICD-10-CM

## 2022-11-24 NOTE — Patient Instructions (Signed)
Visit Information  Thank you for taking time to visit with me today. Please don't hesitate to contact me if I can be of assistance to you.   Following are the goals we discussed today:  Continue to take medications as prescribed Continue to attend provider visits as scheduled Contact your provider with health questions or concerns as needed.   If you are experiencing a Mental Health or Behavioral Health Crisis or need someone to talk to, please call the Suicide and Crisis Lifeline: 60  Kathyrn Sheriff, RN, MSN, BSN, CCM [facility] Care Coordinator [phone]

## 2022-11-24 NOTE — Patient Outreach (Signed)
  Care Coordination   Follow Up Visit Note   11/24/2022 Name: Jeremy Waters MRN: [id number] DOB: [birth year]  Jeremy Waters is a 77 y.o. year old male who sees Burns, Bobette Mo, MD for primary care. I spoke with  Jeremy Waters by phone today.  What matters to the patients health and wellness today?  Jeremy Waters reports he is doing well. He continues to participate with cardiac rehab. He denies any questions or concerns today. Denies any care coordination, disease education or resource needs at this time. Jeremy Waters to contact RNCM if care coordination needs in the future. Confirmed patient has contact number.  Goals Addressed             This Visit's Progress    COMPLETED: continue to improve post hospitalization       Interventions Today    Flowsheet Row Most Recent Value  Chronic Disease   Chronic disease during today's visit Other  [(h/o recent MI ED 09/30/22-10/01/22 due to tachycardia)]  General Interventions   General Interventions Discussed/Reviewed General Interventions Reviewed, Doctor Visits  Doctor Visits Discussed/Reviewed Doctor Visits Reviewed  [discussed upcoming/scheduled appointments]  Exercise Interventions   Exercise Discussed/Reviewed Exercise Reviewed  [positive feedback provider regarding participation in cardiac rehab program]  Pharmacy Interventions   Pharmacy Dicussed/Reviewed Pharmacy Topics Reviewed            SDOH assessments and interventions completed:  No  Care Coordination Interventions:  Yes, provided   Follow up plan: No further intervention required.   Encounter Outcome:  Pt. Visit Completed   Kathyrn Sheriff, RN, MSN, BSN, CCM [facility] Care Coordinator [phone]

## 2022-11-26 ENCOUNTER — Encounter (HOSPITAL_COMMUNITY): Payer: PPO

## 2022-11-26 ENCOUNTER — Encounter (HOSPITAL_COMMUNITY)
Admission: RE | Admit: 2022-11-26 | Discharge: 2022-11-26 | Disposition: A | Discharge status: Home / Self Care | Payer: PPO | Source: Ambulatory Visit | Attending: Cardiology | Admitting: Cardiology

## 2022-11-26 DIAGNOSIS — Z951 Presence of aortocoronary bypass graft: Secondary | ICD-10-CM

## 2022-11-26 DIAGNOSIS — I214 Non-ST elevation (NSTEMI) myocardial infarction: Secondary | ICD-10-CM

## 2022-11-26 DIAGNOSIS — Z952 Presence of prosthetic heart valve: Secondary | ICD-10-CM

## 2022-11-27 ENCOUNTER — Encounter: Payer: Self-pay | Admitting: Internal Medicine

## 2022-11-29 ENCOUNTER — Encounter (HOSPITAL_COMMUNITY): Payer: PPO

## 2022-11-29 ENCOUNTER — Encounter (HOSPITAL_COMMUNITY)
Admission: RE | Admit: 2022-11-29 | Discharge: 2022-11-29 | Disposition: A | Discharge status: Home / Self Care | Payer: PPO | Source: Ambulatory Visit | Attending: Cardiology | Admitting: Cardiology

## 2022-11-29 DIAGNOSIS — Z951 Presence of aortocoronary bypass graft: Secondary | ICD-10-CM

## 2022-11-29 DIAGNOSIS — I214 Non-ST elevation (NSTEMI) myocardial infarction: Secondary | ICD-10-CM

## 2022-11-29 DIAGNOSIS — Z952 Presence of prosthetic heart valve: Secondary | ICD-10-CM

## 2022-11-30 ENCOUNTER — Other Ambulatory Visit: Payer: Self-pay | Admitting: Internal Medicine

## 2022-11-30 DIAGNOSIS — I480 Paroxysmal atrial fibrillation: Secondary | ICD-10-CM

## 2022-11-30 MED ORDER — DILTIAZEM HCL 30 MG PO TABS
30.0000 mg | ORAL_TABLET | Freq: Four times a day (QID) | ORAL | 1 refills | Status: AC | PRN
Start: 2022-11-30 — End: 2023-05-29

## 2022-12-01 ENCOUNTER — Ambulatory Visit (HOSPITAL_COMMUNITY): Payer: PPO

## 2022-12-01 ENCOUNTER — Encounter (HOSPITAL_COMMUNITY): Payer: PPO

## 2022-12-03 ENCOUNTER — Ambulatory Visit (HOSPITAL_COMMUNITY): Payer: PPO

## 2022-12-03 ENCOUNTER — Encounter (HOSPITAL_COMMUNITY): Payer: PPO

## 2022-12-12 DIAGNOSIS — U071 COVID-19: Secondary | ICD-10-CM | POA: Diagnosis not present

## 2022-12-13 ENCOUNTER — Other Ambulatory Visit: Payer: Self-pay | Admitting: Internal Medicine

## 2022-12-13 ENCOUNTER — Encounter (HOSPITAL_COMMUNITY): Payer: PPO

## 2022-12-13 ENCOUNTER — Telehealth: Payer: Self-pay | Admitting: Internal Medicine

## 2022-12-13 DIAGNOSIS — U071 COVID-19: Secondary | ICD-10-CM

## 2022-12-13 MED ORDER — NIRMATRELVIR/RITONAVIR (PAXLOVID)TABLET
3.0000 | ORAL_TABLET | Freq: Two times a day (BID) | ORAL | 0 refills | Status: AC
Start: 2022-12-13 — End: 2022-12-18

## 2022-12-13 NOTE — Telephone Encounter (Signed)
Called pt gave him MD response. Pt states he is actually going into his 8th day, and he did not start the Paxlovid, but if he starts feeling bad he will let MD know...lmb

## 2022-12-13 NOTE — Telephone Encounter (Signed)
RX sent but there are interactions with eliquis AND crestor

## 2022-12-13 NOTE — Telephone Encounter (Signed)
Pt called in sating he tested positive for COVID on yesterday and wanted to know if he could be prescribed something. Please advise

## 2022-12-13 NOTE — Telephone Encounter (Signed)
Spoke with patient's spouse today.  He will hold Crestor for 5 days until anti-viral is completed.

## 2022-12-13 NOTE — Telephone Encounter (Signed)
Patient wanted to know if his clopidogrel (PLAVIX) 75 MG tablet was okay to take with the Paxlovid as well. He would like a call back at [phone].

## 2022-12-13 NOTE — Telephone Encounter (Signed)
MD is out of the office until 7/29, sending msg to DOD.Marland KitchenRaechel Chute

## 2022-12-14 ENCOUNTER — Telehealth: Payer: Self-pay | Admitting: Internal Medicine

## 2022-12-14 ENCOUNTER — Ambulatory Visit: Payer: PPO | Admitting: Internal Medicine

## 2022-12-14 NOTE — Telephone Encounter (Signed)
He does not need to be fasting, will not have labs checked. If his infection was a week ago and he's testing negative now, ok to come in. Would have him wear a mask though.

## 2022-12-14 NOTE — Telephone Encounter (Signed)
Patient aware it is ok to come to appointment tomorrow. That he just need to wear mask

## 2022-12-14 NOTE — Telephone Encounter (Signed)
Patient states he has questions prior to his appt with the pharmacist tomorrow. Requesting call back.

## 2022-12-14 NOTE — Telephone Encounter (Signed)
Patient is coming for lipid clinic tomorrow. He would like to know will you repeat labs and if he needs to be fasting.  He also had Covid last week and he is now testing negative. Only symptom is still a runny nose. He would like to know if he needs to reschedule

## 2022-12-15 ENCOUNTER — Telehealth: Payer: Self-pay | Admitting: Pharmacist

## 2022-12-15 ENCOUNTER — Other Ambulatory Visit (HOSPITAL_COMMUNITY): Payer: Self-pay

## 2022-12-15 ENCOUNTER — Ambulatory Visit: Payer: PPO | Attending: Internal Medicine | Admitting: Pharmacist

## 2022-12-15 ENCOUNTER — Telehealth: Payer: Self-pay

## 2022-12-15 ENCOUNTER — Encounter: Payer: Self-pay | Admitting: Pharmacist

## 2022-12-15 ENCOUNTER — Encounter (HOSPITAL_COMMUNITY): Payer: PPO

## 2022-12-15 DIAGNOSIS — I214 Non-ST elevation (NSTEMI) myocardial infarction: Secondary | ICD-10-CM | POA: Diagnosis not present

## 2022-12-15 DIAGNOSIS — E782 Mixed hyperlipidemia: Secondary | ICD-10-CM | POA: Diagnosis not present

## 2022-12-15 DIAGNOSIS — H0288B Meibomian gland dysfunction left eye, upper and lower eyelids: Secondary | ICD-10-CM | POA: Diagnosis not present

## 2022-12-15 DIAGNOSIS — Z961 Presence of intraocular lens: Secondary | ICD-10-CM | POA: Diagnosis not present

## 2022-12-15 DIAGNOSIS — H35373 Puckering of macula, bilateral: Secondary | ICD-10-CM | POA: Diagnosis not present

## 2022-12-15 DIAGNOSIS — H4323 Crystalline deposits in vitreous body, bilateral: Secondary | ICD-10-CM | POA: Diagnosis not present

## 2022-12-15 DIAGNOSIS — Z951 Presence of aortocoronary bypass graft: Secondary | ICD-10-CM

## 2022-12-15 NOTE — Telephone Encounter (Signed)
Pharmacy Patient Advocate Encounter   Received notification from Physician's Office/PHARMD PAVERO that prior authorization for REPATHAis required/requested.   Insurance verification completed.   The patient is insured through [employer]/ [employer] .   Per test claim: PA submitted to [employer]/ [employer] via CoverMyMeds Key/confirmation #/[id number]    Status is pending   P/A HAS BEEN SENT VIA EMAIL/FAX

## 2022-12-15 NOTE — Patient Instructions (Addendum)
It was nice meeting you today  We would like your LDL (bad cholesterol) to be less than 55  Please continue your rosuvastatin 40mg  once a day  The medication we discussed today is called Repatha which you would inject once every 2 weeks  I will complete the prior authorization for you and contact you when it is approved  Once you start the medication we will recheck your cholesterol in 2-3 months  Please message or call with any questions  Laural Golden, PharmD, BCACP, CDCES, CPP [address], [address] [place], [place], [address] Phone: [phone], Fax: [phone]

## 2022-12-15 NOTE — Telephone Encounter (Signed)
Please complete PA for repatha 

## 2022-12-15 NOTE — Progress Notes (Signed)
Patient ID: Jeremy Waters                 DOB: 09-14-1945                    MRN: [id number]     HPI: Jeremy Waters is a 77 y.o. male patient referred to lipid clinic by Edd Fabian. PMH is significant for NSTEMI CAD, A Fib, AVR, CABG and a history of prostate cancer.  Patient presents today in good spirits. Just returned from fishing trip in [place] but unfortunately contracted Covid. Recently recovered.  Currently managed on rosuvastatin 40mg  daily with no patient reported adverse effects. Last LDL 76 on 10/28/22.    Patient reports no chest pain or SOB. Reports before his NSTEMI he felt fine and only knew there was an issue because his Apple watch advised he was having an arrhthymia. Had actually gone for a run the day before and had felt fine.  Current Medications: rosuvastatin 40mg   Risk Factors:  CAD Hx of NSTEMI  LDL goal: <55  Labs: TC 151, Trigs 72, HDL 61, LDL 76 (10/28/22 on rosuvastatin 40)  Past Medical History:  Diagnosis Date   Adenomatous colon polyp 09/2007   Arthritis    OA / PAIN LEFT KNEE   Cancer (HCC)    prostate cancer   Cataract    removed both eyes   Coronary artery disease    COVID-19 virus infection    Diverticulosis of colon    w/o hemorrage   Heart murmur    TOLD HE HAS A SLIGHT MURMUR   Hyperlipidemia    Inguinal hernia    left side    Thyroid disease     Current Outpatient Medications on File Prior to Visit  Medication Sig Dispense Refill   apixaban (ELIQUIS) 5 MG TABS tablet Take 1 tablet (5 mg total) by mouth 2 (two) times daily. 60 tablet 3   beta carotene 40981 UNIT capsule Take 25,000 Units by mouth daily.     clopidogrel (PLAVIX) 75 MG tablet Take 1 tablet (75 mg total) by mouth daily. (Patient not taking: Reported on 11/02/2022) 30 tablet 11   diltiazem (CARDIZEM) 30 MG tablet Take 1 tablet (30 mg total) by mouth 4 (four) times daily as needed (heart rates < 120 bpm). 360 tablet 1   fluticasone (FLONASE) 50 MCG/ACT nasal spray  SPRAY ONE SPRAY IN EACH NOSTRIL ONCE DAILY (Patient taking differently: Place 1 spray into both nostrils daily.) 16 mL 3   levothyroxine (SYNTHROID) 50 MCG tablet Take 1 tablet (50 mcg total) by mouth daily. 90 tablet 2   nirmatrelvir/ritonavir (PAXLOVID) 20 x 150 MG & 10 x 100MG  TABS Take 3 tablets by mouth 2 (two) times daily for 5 days. (Take nirmatrelvir 150 mg two tablets twice daily for 5 days and ritonavir 100 mg one tablet twice daily for 5 days) Patient GFR is 60 30 tablet 0   pyridOXINE (VITAMIN B-6) 100 MG tablet Take 100 mg by mouth daily.     rosuvastatin (CRESTOR) 40 MG tablet TAKE 1 TABLET BY MOUTH DAILY 90 tablet 1   vitamin C (ASCORBIC ACID) 500 MG tablet Take 500 mg by mouth 2 (two) times daily.     vitamin E 400 UNIT capsule Take 400 Units by mouth daily.     No current facility-administered medications on file prior to visit.    Allergies  Allergen Reactions   Atorvastatin Other (See Comments)    Increased LFTs  Assessment/Plan:  1. Hyperlipidemia - Patient's last LDL 76 which is above goal of <55. Discussed lipid lowering options such as Zetia and PCSK9i. Patient interested in Repatha due to better LDL lowering and reduced risk of hospitalizations.  Using demo pen, educated patient on mechanism of action, storage, site selection, administration, and possible adverse effects. Patient voiced understanding. Will complete PA and contact patient when approved. Patient interested in delivry services but is moving in September. Repeat lipid panel in 2-3 months.  Continue rosuvastatin 40mg  daily Start Repatha q 2 weeks Recheck lipid panel in 2-3 months  Laural Golden, PharmD, BCACP, CDCES, CPP [address], [address] [place], [place], [address] Phone: [phone], Fax: [phone]

## 2022-12-16 DIAGNOSIS — Z6820 Body mass index (BMI) 20.0-20.9, adult: Secondary | ICD-10-CM | POA: Diagnosis not present

## 2022-12-16 DIAGNOSIS — I509 Heart failure, unspecified: Secondary | ICD-10-CM | POA: Diagnosis not present

## 2022-12-16 DIAGNOSIS — Z7901 Long term (current) use of anticoagulants: Secondary | ICD-10-CM | POA: Diagnosis not present

## 2022-12-16 DIAGNOSIS — Z951 Presence of aortocoronary bypass graft: Secondary | ICD-10-CM | POA: Diagnosis not present

## 2022-12-16 DIAGNOSIS — I480 Paroxysmal atrial fibrillation: Secondary | ICD-10-CM | POA: Diagnosis not present

## 2022-12-16 DIAGNOSIS — I2510 Atherosclerotic heart disease of native coronary artery without angina pectoris: Secondary | ICD-10-CM | POA: Diagnosis not present

## 2022-12-16 DIAGNOSIS — Z8616 Personal history of COVID-19: Secondary | ICD-10-CM | POA: Diagnosis not present

## 2022-12-17 ENCOUNTER — Encounter (HOSPITAL_COMMUNITY): Payer: PPO

## 2022-12-17 MED ORDER — REPATHA SURECLICK 140 MG/ML ~~LOC~~ SOAJ: 1.0000 mL | SUBCUTANEOUS | 0 refills | Status: DC

## 2022-12-17 NOTE — Addendum Note (Signed)
Addended by: Cheree Ditto on: 12/17/2022 10:08 AM   Modules accepted: Orders

## 2022-12-17 NOTE — Telephone Encounter (Signed)
Pharmacy Patient Advocate Encounter  Received notification from [employer]/ Rx Advance that Prior Authorization for REPATHA has been  APPROVED FROM 7.25.24 TO 1.25.25

## 2022-12-20 ENCOUNTER — Encounter: Payer: Self-pay | Admitting: Internal Medicine

## 2022-12-20 ENCOUNTER — Encounter (HOSPITAL_COMMUNITY)
Admission: RE | Admit: 2022-12-20 | Discharge: 2022-12-20 | Disposition: A | Discharge status: Home / Self Care | Payer: PPO | Source: Ambulatory Visit | Attending: Cardiology | Admitting: Cardiology

## 2022-12-20 VITALS — Ht 67.75 in | Wt 138.9 lb

## 2022-12-20 DIAGNOSIS — I214 Non-ST elevation (NSTEMI) myocardial infarction: Secondary | ICD-10-CM

## 2022-12-20 DIAGNOSIS — Z952 Presence of prosthetic heart valve: Secondary | ICD-10-CM

## 2022-12-20 DIAGNOSIS — Z951 Presence of aortocoronary bypass graft: Secondary | ICD-10-CM

## 2022-12-20 MED ORDER — APIXABAN 5 MG PO TABS: 5.0000 mg | ORAL_TABLET | Freq: Two times a day (BID) | ORAL | 1 refills | Status: DC

## 2022-12-20 NOTE — Progress Notes (Signed)
Discharge Progress Report  Patient Details  Name: Jeremy Waters MRN: [id number] Date of Birth: Nov 05, 1945 Referring Provider:   Flowsheet Row INTENSIVE CARDIAC REHAB ORIENT from 09/21/2022 in [facility]  Referring Provider Enter, Waverly Ferrari, MD        Number of Visits: 18  Reason for Discharge:  Patient reached a stable level of exercise. Patient independent in their exercise. Patient has met program and personal goals.  Smoking History:  Social History   Tobacco Use  Smoking Status Former  Smokeless Tobacco Never  Tobacco Comments   stopped smoking cigarettes 1983, 1 cigar /day 2001-2013    Diagnosis:  08/15/22 NSTEMI (non-ST elevated myocardial infarction) (HCC)  08/18/22 S/P CABG x 2  08/18/22 S/P AVR (aortic valve replacement)  ADL UCSD:   Initial Exercise Prescription:  Initial Exercise Prescription - 09/21/22 1100       Date of Initial Exercise RX and Referring Provider   Date 09/21/22    Referring Provider Enter, Waverly Ferrari, MD    Expected Discharge Date 12/03/22      Recumbant Elliptical   Level 1    Minutes 15    METs 2.8      Track   Laps 14    Minutes 15    METs 2.79      Prescription Details   Frequency (times per week) 3    Duration Progress to 30 minutes of continuous aerobic without signs/symptoms of physical distress      Intensity   THRR 40-80% of Max Heartrate 58-116    Ratings of Perceived Exertion 11-13    Perceived Dyspnea 0-4      Progression   Progression Continue to progress workloads to maintain intensity without signs/symptoms of physical distress.      Resistance Training   Training Prescription Yes    Weight 3 lbs    Reps 10-15             Discharge Exercise Prescription (Final Exercise Prescription Changes):  Exercise Prescription Changes - 12/20/22 0836       Response to Exercise   Blood Pressure (Admit) 100/60    Blood Pressure (Exercise) 134/62     Blood Pressure (Exit) 108/60    Heart Rate (Admit) 90 bpm    Heart Rate (Exercise) 103 bpm    Heart Rate (Exit) 83 bpm    Rating of Perceived Exertion (Exercise) 11.5    Symptoms none    Comments Pt graduated teh [employer] program    Duration Continue with 30 min of aerobic exercise without signs/symptoms of physical distress.    Intensity THRR unchanged      Progression   Progression Continue to progress workloads to maintain intensity without signs/symptoms of physical distress.    Average METs 6.05      Resistance Training   Training Prescription Yes    Weight 5 lbs    Reps 10-15    Time 10 Minutes      Recumbant Elliptical   Level 4    RPM 86    Watts 137    Minutes 15    METs 7.7      Track   Laps 10   2350ft total   Minutes 15    METs 4.4      Home Exercise Plan   Plans to continue exercise at Home (comment)    Frequency Add 3 additional days to program exercise sessions.    Initial  Home Exercises Provided 10/11/22             Functional Capacity:  6 Minute Walk     Row Name 09/21/22 1041 12/20/22 0835       6 Minute Walk   Phase Initial Discharge    Distance 1327 feet 2342 feet    Distance % Change -- 76.49 %    Distance Feet Change -- 1015 ft    Walk Time 6 minutes 6 minutes    # of Rest Breaks 0 0    MPH 2.51 4.44    METS 2.76 4.87    RPE 11 12    Perceived Dyspnea  0 0    VO2 Peak 9.66 17.03    Symptoms No No    Resting HR 61 bpm 90 bpm    Resting BP 94/66 100/60    Resting Oxygen Saturation  98 % --    Exercise Oxygen Saturation  during 6 min walk 96 % --    Max Ex. HR 78 bpm 102 bpm    Max Ex. BP 124/85 134/62    2 Minute Post BP 114/79 120/64             6 Minute Walk     Row Name 09/21/22 1041 12/20/22 0835       6 Minute Walk   Phase Initial Discharge    Distance 1327 feet 2342 feet    Distance % Change -- 76.49 %    Distance Feet Change -- 1015 ft    Walk Time 6 minutes 6 minutes    # of Rest Breaks 0 0     MPH 2.51 4.44    METS 2.76 4.87    RPE 11 12    Perceived Dyspnea  0 0    VO2 Peak 9.66 17.03    Symptoms No No    Resting HR 61 bpm 90 bpm    Resting BP 94/66 100/60    Resting Oxygen Saturation  98 % --    Exercise Oxygen Saturation  during 6 min walk 96 % --    Max Ex. HR 78 bpm 102 bpm    Max Ex. BP 124/85 134/62    2 Minute Post BP 114/79 120/64             Psychological, QOL, Others - Outcomes: PHQ 2/9:    12/20/2022    3:51 PM 12/20/2022    9:56 AM 09/21/2022   11:28 AM 09/06/2022   10:49 AM 07/29/2022    7:52 AM  Depression screen PHQ 2/9  Decreased Interest 0 0 0 0 0  Down, Depressed, Hopeless 0 0 0 0 0  PHQ - 2 Score 0 0 0 0 0  Altered sleeping 2 0 3  2  Tired, decreased energy 0 0 1  1  Change in appetite 0 0 0  0  Feeling bad or failure about yourself  0 0 0  0  Trouble concentrating 1 0 0  1  Moving slowly or fidgety/restless 0 0 0  0  Suicidal thoughts 0 0 0  0  PHQ-9 Score 3 0 4  4  Difficult doing work/chores Not difficult at all Not difficult at all Not difficult at all  Not difficult at all    Quality of Life:  Quality of Life - 12/20/22 0844       Quality of Life   Select Quality of Life      Quality of Life Scores  Health/Function Post 21.23 %    Socioeconomic Post 24.88 %    Psych/Spiritual Post 23.07 %    Family Post 27.1 %    GLOBAL Post 23.27 %             Personal Goals: Goals established at orientation with interventions provided to work toward goal.  Personal Goals and Risk Factors at Admission - 09/21/22 0947       Core Components/Risk Factors/Patient Goals on Admission   Hypertension --    Intervention --    Expected Outcomes --    Lipids Yes    Intervention Provide education and support for participant on nutrition & aerobic/resistive exercise along with prescribed medications to achieve LDL 70mg , HDL >40mg .    Expected Outcomes Short Term: Participant states understanding of desired cholesterol values and is  compliant with medications prescribed. Participant is following exercise prescription and nutrition guidelines.;Long Term: Cholesterol controlled with medications as prescribed, with individualized exercise RX and with personalized nutrition plan. Value goals: LDL < 70mg , HDL > 40 mg.    Personal Goal Other Yes    Personal Goal Be able to walk 6 miles/day on the golf [place]. Resume exercising 40 minutes on his elliptical machine at home. Resume core muscle exercises.    Intervention Develop individualized exercise action plan including aerobic, resistance, and stretching exercise to help build strength and stamina.    Expected Outcomes Patient will be able to resume home exercise routine on his elliptical machine. Patient will be able to walk the golf course. Patient will be able to resume core strength exercises once cleared by physician to do so.              Personal Goals Discharge:  Goals and Risk Factor Review     Row Name 09/29/22 1500 10/22/22 0839 11/22/22 1103 12/20/22 1516       Core Components/Risk Factors/Patient Goals Review   Personal Goals Review Weight Management/Obesity;Lipids Weight Management/Obesity;Lipids Weight Management/Obesity;Lipids Weight Management/Obesity;Lipids    Review Jeremy Waters started intensive cardiac rehab on 09/29/22 and did well with exercise, vital signs were stable. Systolic BP in the 90's-100's. Will continue to monitor bP Jeremy Waters is doing well with exercise at  intensive cardiac rehab. Vital signs have been  stable. Jeremy Waters feels good about his progress and has lost 2.6 kg since starting cardiac rehab Jeremy Waters is doing well with exercise at  intensive cardiac rehab. Vital signs have been  stable. Jeremy Waters feels good about his progress and has lost 2.6 kg since starting cardiac rehab. Current wt is 62 kg. Jeremy Waters graduates today with the completion of 49 sesions for exercise and educational offerings.  VS remain stable with weight loss of 3 kg.  Lipids remain stable on current  regiment of statin therapy along wih Repatha and dietary modifications. Jeremy Waters showed much improvement of over a 1,000   feet during his 6 minute walk test!  Jeremy Waters also showed increased scores on his post QOL survey in most of the areas with significant increase in overall.    Expected Outcomes Jeremy Waters will continue to participate in intensive cardiac rehab for exercise, nutrition and lifestyle modifications Jeremy Waters will continue to participate in intensive cardiac rehab for exercise, nutrition and lifestyle modifications Jeremy Waters will continue to participate in intensive cardiac rehab for exercise, nutrition and lifestyle modifications Jeremy Waters will continue to exercise with walking and core work.  Plans to do  30-60 minutes for 5-7 days as well as continued adoption of a heart healthy lifestyle.  Goals and Risk Factor Review     Row Name 09/29/22 1500 10/22/22 0839 11/22/22 1103 12/20/22 1516       Core Components/Risk Factors/Patient Goals Review   Personal Goals Review Weight Management/Obesity;Lipids Weight Management/Obesity;Lipids Weight Management/Obesity;Lipids Weight Management/Obesity;Lipids    Review Jeremy Waters started intensive cardiac rehab on 09/29/22 and did well with exercise, vital signs were stable. Systolic BP in the 90's-100's. Will continue to monitor bP Jeremy Waters is doing well with exercise at  intensive cardiac rehab. Vital signs have been  stable. Jeremy Waters feels good about his progress and has lost 2.6 kg since starting cardiac rehab Jeremy Waters is doing well with exercise at  intensive cardiac rehab. Vital signs have been  stable. Jeremy Waters feels good about his progress and has lost 2.6 kg since starting cardiac rehab. Current wt is 62 kg. Jeremy Waters graduates today with the completion of 49 sesions for exercise and educational offerings.  VS remain stable with weight loss of 3 kg.  Lipids remain stable on current regiment of statin therapy along wih Repatha and dietary modifications. Jeremy Waters showed much improvement of over a  1,000   feet during his 6 minute walk test!  Jeremy Waters also showed increased scores on his post QOL survey in most of the areas with significant increase in overall.    Expected Outcomes Jeremy Waters will continue to participate in intensive cardiac rehab for exercise, nutrition and lifestyle modifications Jeremy Waters will continue to participate in intensive cardiac rehab for exercise, nutrition and lifestyle modifications Jeremy Waters will continue to participate in intensive cardiac rehab for exercise, nutrition and lifestyle modifications Jeremy Waters will continue to exercise with walking and core work.  Plans to do  30-60 minutes for 5-7 days as well as continued adoption of a heart healthy lifestyle.             Exercise Goals and Review:  Exercise Goals     Row Name 09/21/22 0946             Exercise Goals   Increase Physical Activity Yes       Intervention Provide advice, education, support and counseling about physical activity/exercise needs.;Develop an individualized exercise prescription for aerobic and resistive training based on initial evaluation findings, risk stratification, comorbidities and participant's personal goals.       Expected Outcomes Short Term: Attend rehab on a regular basis to increase amount of physical activity.;Long Term: Add in home exercise to make exercise part of routine and to increase amount of physical activity.;Long Term: Exercising regularly at least 3-5 days a week.       Increase Strength and Stamina Yes       Intervention Provide advice, education, support and counseling about physical activity/exercise needs.;Develop an individualized exercise prescription for aerobic and resistive training based on initial evaluation findings, risk stratification, comorbidities and participant's personal goals.       Expected Outcomes Short Term: Increase workloads from initial exercise prescription for resistance, speed, and METs.;Short Term: Perform resistance training exercises routinely during  rehab and add in resistance training at home;Long Term: Improve cardiorespiratory fitness, muscular endurance and strength as measured by increased METs and functional capacity ( )       Able to understand and use rate of perceived exertion (RPE) scale Yes       Intervention Provide education and explanation on how to use RPE scale       Expected Outcomes Short Term: Able to use RPE daily in rehab to express subjective intensity level;Long Term:  Able to use  RPE to guide intensity level when exercising independently       Knowledge and understanding of Target Heart Rate Range (THRR) Yes       Intervention Provide education and explanation of THRR including how the numbers were predicted and where they are located for reference       Expected Outcomes Short Term: Able to state/look up THRR;Long Term: Able to use THRR to govern intensity when exercising independently;Short Term: Able to use daily as guideline for intensity in rehab       Able to check pulse independently Yes       Intervention Provide education and demonstration on how to check pulse in carotid and radial arteries.;Review the importance of being able to check your own pulse for safety during independent exercise       Expected Outcomes Short Term: Able to explain why pulse checking is important during independent exercise;Long Term: Able to check pulse independently and accurately       Understanding of Exercise Prescription Yes       Intervention Provide education, explanation, and written materials on patient's individual exercise prescription       Expected Outcomes Short Term: Able to explain program exercise prescription;Long Term: Able to explain home exercise prescription to exercise independently                Exercise Goals     Row Name 09/21/22 0946             Exercise Goals   Increase Physical Activity Yes       Intervention Provide advice, education, support and counseling about physical activity/exercise  needs.;Develop an individualized exercise prescription for aerobic and resistive training based on initial evaluation findings, risk stratification, comorbidities and participant's personal goals.       Expected Outcomes Short Term: Attend rehab on a regular basis to increase amount of physical activity.;Long Term: Add in home exercise to make exercise part of routine and to increase amount of physical activity.;Long Term: Exercising regularly at least 3-5 days a week.       Increase Strength and Stamina Yes       Intervention Provide advice, education, support and counseling about physical activity/exercise needs.;Develop an individualized exercise prescription for aerobic and resistive training based on initial evaluation findings, risk stratification, comorbidities and participant's personal goals.       Expected Outcomes Short Term: Increase workloads from initial exercise prescription for resistance, speed, and METs.;Short Term: Perform resistance training exercises routinely during rehab and add in resistance training at home;Long Term: Improve cardiorespiratory fitness, muscular endurance and strength as measured by increased METs and functional capacity ( )       Able to understand and use rate of perceived exertion (RPE) scale Yes       Intervention Provide education and explanation on how to use RPE scale       Expected Outcomes Short Term: Able to use RPE daily in rehab to express subjective intensity level;Long Term:  Able to use RPE to guide intensity level when exercising independently       Knowledge and understanding of Target Heart Rate Range (THRR) Yes       Intervention Provide education and explanation of THRR including how the numbers were predicted and where they are located for reference       Expected Outcomes Short Term: Able to state/look up THRR;Long Term: Able to use THRR to govern intensity when exercising independently;Short Term: Able to use daily as guideline for  intensity in rehab       Able to check pulse independently Yes       Intervention Provide education and demonstration on how to check pulse in carotid and radial arteries.;Review the importance of being able to check your own pulse for safety during independent exercise       Expected Outcomes Short Term: Able to explain why pulse checking is important during independent exercise;Long Term: Able to check pulse independently and accurately       Understanding of Exercise Prescription Yes       Intervention Provide education, explanation, and written materials on patient's individual exercise prescription       Expected Outcomes Short Term: Able to explain program exercise prescription;Long Term: Able to explain home exercise prescription to exercise independently                Exercise Goals Re-Evaluation:  Exercise Goals Re-Evaluation     Row Name 09/29/22 0843 10/11/22 0838 11/10/22 0825 12/20/22 0839       Exercise Goal Re-Evaluation   Exercise Goals Review Increase Physical Activity;Understanding of Exercise Prescription;Increase Strength and Stamina;Knowledge and understanding of Target Heart Rate Range (THRR);Able to understand and use rate of perceived exertion (RPE) scale Increase Physical Activity;Understanding of Exercise Prescription;Increase Strength and Stamina;Knowledge and understanding of Target Heart Rate Range (THRR);Able to understand and use rate of perceived exertion (RPE) scale Increase Physical Activity;Increase Strength and Stamina;Able to understand and use rate of perceived exertion (RPE) scale;Knowledge and understanding of Target Heart Rate Range (THRR);Understanding of Exercise Prescription Increase Physical Activity;Increase Strength and Stamina;Able to understand and use rate of perceived exertion (RPE) scale;Knowledge and understanding of Target Heart Rate Range (THRR);Understanding of Exercise Prescription    Comments Pt first day in the [facility] Program.  Pt tolerated exercise well with an average MET level of 4.8. Pt is off to a great start and is learning his THRR, RPE and ExRx Pt first day in the [facility] program. Pt tolerated exercise well with an average MET level of 5.13. Pt feels good about his goals, he is increasing MET's, strength and stamina. Pt is excited about doing more for home ExRx. He is already walking, but he will add in some sternal precautions core exercises and hand weights for 30-45 mins 5-7 days a week. Pt will let me know if the new exercises interfere with chronic back issues. Pt is feeling good about his progress so far. Reviewed METs and goals with pt toda.y Pt is tolerating exercise well with an avg MET levle of 6.1. Pt continues to progress within program well and will increase Octane WL next session. Pt feels good about his goals and has been doing core exercises and walking at home. His walking distance has improved to 3 miles a day and he feels as if his stamina has greatly increased. Pt graduated teh [employer]. Pt is tolerating exercise well with an avg MET levle of 6.05. Pt did well in the program and increased strength and stamina. He was able to increase his by 1015 ft for a total of 2336ft. Pt is moving and will have access to a community gym and will continue to walk and do core exercises 5-7 days for 30-60 mins per session.    Expected Outcomes Will continue to monitor pt and progress work loads as tolerated without sign or symptom Pt will exercise on his own and gain strength. Will continue to monitor pt and progress work loads as tolerated without  sign or symptom Will continue to progress workloads as tolerated without s/sx. Pt will continue to exercise on his own and gain strength.             Exercise Goals Re-Evaluation     Row Name 09/29/22 [phone] 10/11/22 0838 11/10/22 0825 12/20/22 0839       Exercise Goal Re-Evaluation   Exercise Goals Review Increase Physical Activity;Understanding of Exercise  Prescription;Increase Strength and Stamina;Knowledge and understanding of Target Heart Rate Range (THRR);Able to understand and use rate of perceived exertion (RPE) scale Increase Physical Activity;Understanding of Exercise Prescription;Increase Strength and Stamina;Knowledge and understanding of Target Heart Rate Range (THRR);Able to understand and use rate of perceived exertion (RPE) scale Increase Physical Activity;Increase Strength and Stamina;Able to understand and use rate of perceived exertion (RPE) scale;Knowledge and understanding of Target Heart Rate Range (THRR);Understanding of Exercise Prescription Increase Physical Activity;Increase Strength and Stamina;Able to understand and use rate of perceived exertion (RPE) scale;Knowledge and understanding of Target Heart Rate Range (THRR);Understanding of Exercise Prescription    Comments Pt first day in the [facility] Program. Pt tolerated exercise well with an average MET level of 4.8. Pt is off to a great start and is learning his THRR, RPE and ExRx Pt first day in the [facility] program. Pt tolerated exercise well with an average MET level of 5.13. Pt feels good about his goals, he is increasing MET's, strength and stamina. Pt is excited about doing more for home ExRx. He is already walking, but he will add in some sternal precautions core exercises and hand weights for 30-45 mins 5-7 days a week. Pt will let me know if the new exercises interfere with chronic back issues. Pt is feeling good about his progress so far. Reviewed METs and goals with pt toda.y Pt is tolerating exercise well with an avg MET levle of 6.1. Pt continues to progress within program well and will increase Octane WL next session. Pt feels good about his goals and has been doing core exercises and walking at home. His walking distance has improved to 3 miles a day and he feels as if his stamina has greatly increased. Pt graduated teh [employer]. Pt is tolerating exercise well  with an avg MET levle of 6.05. Pt did well in the program and increased strength and stamina. He was able to increase his by 1015 ft for a total of 2351ft. Pt is moving and will have access to a community gym and will continue to walk and do core exercises 5-7 days for 30-60 mins per session.    Expected Outcomes Will continue to monitor pt and progress work loads as tolerated without sign or symptom Pt will exercise on his own and gain strength. Will continue to monitor pt and progress work loads as tolerated without sign or symptom Will continue to progress workloads as tolerated without s/sx. Pt will continue to exercise on his own and gain strength.             Nutrition & Weight - Outcomes:  Pre Biometrics - 09/21/22 0906       Pre Biometrics   Waist Circumference 32 inches    Hip Circumference 37 inches    Waist to Hip Ratio 0.86 %    Triceps Skinfold 9 mm    % Body Fat 19.9 %    Grip Strength 21 kg    Flexibility 0 in   knees bent   Single Leg Stand 30 seconds  Post Biometrics - 12/20/22 0843        Post  Biometrics   Height 5' 7.75" (1.721 m)    Weight 63 kg    Waist Circumference 34 inches    Hip Circumference 35 inches    Waist to Hip Ratio 0.97 %    BMI (Calculated) 21.27    Triceps Skinfold 10 mm    % Body Fat 21.1 %    Grip Strength 23 kg    Flexibility 0 in    Single Leg Stand 30 seconds             Nutrition:  Nutrition Therapy & Goals - 11/22/22 0914       Nutrition Therapy   Diet Heart Healthy Diet    Drug/Food Interactions Statins/Certain Fruits      Personal Nutrition Goals   Nutrition Goal Patient to identify strategies for reducing cardiovascular risk by attending the [facility] education and nutrition series weekly.    Personal Goal #2 Patient to improve diet quality by using the plate method as a guide for meal planning to include lean protein/plant protein, fruits, vegetables, whole grains, nonfat dairy as part of a  well-balanced diet.    Comments Goals in action. Jeremy Waters continues to attend the [employer] and nutrition series regularly. He has made many dietary changes including increased dietary fiber, reduced sodium, and continues mindfulness of saturated fat. He is down 4.2# since starting with our program but he is up nearly 2# over the last 30 days from his lowest weight. We have previously discussed strategies for weight re-gain and ultimately weight mangement including increasing heart healthy fats, increasing high quality carbohydrate and protein proteins, and increasing eating frequency to aid with meeting nutrition needs for age. Patient will benefit from participation in intensive cardiac rehab for nutrition, exercise, and lifestyle modification.      Intervention Plan   Intervention Prescribe, educate and counsel regarding individualized specific dietary modifications aiming towards targeted core components such as weight, hypertension, lipid management, diabetes, heart failure and other comorbidities.;Nutrition handout(s) given to patient.    Expected Outcomes Short Term Goal: Understand basic principles of dietary content, such as calories, fat, sodium, cholesterol and nutrients.;Long Term Goal: Adherence to prescribed nutrition plan.             Nutrition Discharge:  Nutrition Assessments - 11/29/22 1015       Rate Your Plate Scores   Pre Score 70    Post Score 77             Education Questionnaire Score:  Knowledge Questionnaire Score - 12/20/22 0846       Knowledge Questionnaire Score   Post Score 21/24             Goals reviewed with patient; copy given to patient.Pt graduates from  Intensive/Traditional cardiac rehab program today with completion of  49 exercise and education sessions. Pt maintained good attendance and progressed nicely during their participation in rehab as evidenced by increased MET level. Jeremy Waters increased his distance on his post exercise walk  test by 1015 feet!!!   Medication list reconciled. Repeat  PHQ score- 3 .  Pt has made significant lifestyle changes and should be commended for their success. Jeremy Waters  achieved their goals during cardiac rehab.   Pt plans to continue exercise at  home by walking going to a community gym and doing core exercises 5-7 days a week.  We are proud of Bob's progress! Thayer Headings RN BSN

## 2022-12-21 DIAGNOSIS — L718 Other rosacea: Secondary | ICD-10-CM | POA: Diagnosis not present

## 2022-12-21 DIAGNOSIS — D2261 Melanocytic nevi of right upper limb, including shoulder: Secondary | ICD-10-CM | POA: Diagnosis not present

## 2022-12-21 DIAGNOSIS — L821 Other seborrheic keratosis: Secondary | ICD-10-CM | POA: Diagnosis not present

## 2022-12-21 DIAGNOSIS — L812 Freckles: Secondary | ICD-10-CM | POA: Diagnosis not present

## 2022-12-21 DIAGNOSIS — Z85828 Personal history of other malignant neoplasm of skin: Secondary | ICD-10-CM | POA: Diagnosis not present

## 2022-12-21 DIAGNOSIS — D1801 Hemangioma of skin and subcutaneous tissue: Secondary | ICD-10-CM | POA: Diagnosis not present

## 2022-12-21 DIAGNOSIS — D225 Melanocytic nevi of trunk: Secondary | ICD-10-CM | POA: Diagnosis not present

## 2022-12-22 DIAGNOSIS — M545 Low back pain, unspecified: Secondary | ICD-10-CM | POA: Diagnosis not present

## 2022-12-22 DIAGNOSIS — M542 Cervicalgia: Secondary | ICD-10-CM | POA: Diagnosis not present

## 2022-12-28 ENCOUNTER — Encounter: Payer: Self-pay | Admitting: Internal Medicine

## 2022-12-29 ENCOUNTER — Other Ambulatory Visit: Payer: Self-pay | Admitting: Internal Medicine

## 2022-12-29 DIAGNOSIS — Z952 Presence of prosthetic heart valve: Secondary | ICD-10-CM

## 2022-12-29 MED ORDER — AMOXICILLIN 500 MG PO CAPS
1000.0000 mg | ORAL_CAPSULE | Freq: Once | ORAL | 0 refills | Status: AC
Start: 2022-12-29 — End: 2022-12-29

## 2023-01-03 DIAGNOSIS — M542 Cervicalgia: Secondary | ICD-10-CM | POA: Diagnosis not present

## 2023-01-03 DIAGNOSIS — M545 Low back pain, unspecified: Secondary | ICD-10-CM | POA: Diagnosis not present

## 2023-01-11 NOTE — Addendum Note (Signed)
Encounter addended by: Cammy Copa, RN on: 01/11/2023 2:50 PM  Actions taken: Clinical Note Signed, Episode resolved

## 2023-01-18 DIAGNOSIS — M7989 Other specified soft tissue disorders: Secondary | ICD-10-CM | POA: Diagnosis not present

## 2023-01-18 DIAGNOSIS — L03116 Cellulitis of left lower limb: Secondary | ICD-10-CM | POA: Diagnosis not present

## 2023-01-18 DIAGNOSIS — R2242 Localized swelling, mass and lump, left lower limb: Secondary | ICD-10-CM | POA: Diagnosis not present

## 2023-01-21 DIAGNOSIS — L02416 Cutaneous abscess of left lower limb: Secondary | ICD-10-CM | POA: Diagnosis not present

## 2023-01-21 DIAGNOSIS — Z87891 Personal history of nicotine dependence: Secondary | ICD-10-CM | POA: Diagnosis not present

## 2023-01-24 DIAGNOSIS — L03116 Cellulitis of left lower limb: Secondary | ICD-10-CM | POA: Diagnosis not present

## 2023-01-27 NOTE — Progress Notes (Signed)
Subjective:    Patient ID: Jeremy Waters, male    DOB: [birth year], 77 y.o.   MRN: [id number]      HPI Inderpal is here for  Chief Complaint  Patient presents with   Leg infection    Left leg infection (currently on treatment: abx and ointment)     Infection on left leg - from knot on left lower medial leg.  He just developed.  He denies any injury or known bite.  [place] it-the area was red, swollen and very painful.  He went to urgent care.  He was started on Keflex.  He was advised that if it was not better then he should go to the emergency room.  He did end up going to the emergency room because it was not getting better and they drained it-mostly there was blood, but they stated there was some pus as well.  Ultrasound of the leg was negative for DVT.  They advised continue cephalexin and advised following up in urgent care if it was not better.   There is no improvement and he did go back to urgent care at that time was still higher red, swollen and painful.  They stopped the Keflex and started him on clindamycin.  He is also applying Bactroban ointment.  Per him and his wife the urine looks much better.  It is still tender to touch and slightly erythematous, but much better.  He has not had any fevers or chills.  There is no open wound.      Medications and allergies reviewed with patient and updated if appropriate.  Current Outpatient Medications on File Prior to Visit  Medication Sig Dispense Refill   apixaban (ELIQUIS) 5 MG TABS tablet Take 1 tablet (5 mg total) by mouth 2 (two) times daily. 180 tablet 1   beta carotene 40981 UNIT capsule Take 25,000 Units by mouth daily.     cephALEXin (KEFLEX) 500 MG capsule cephALEXin Take 1 Capsule (oral) 3 times per day for 10 days [id number] capsule 3 times per day oral 10 days active 500 mg     clindamycin (CLEOCIN) 300 MG capsule clindamycin HCL Take 1 tablet (oral) 3 times per day for 7 days  [id number] capsule 3 times per day oral 7 days active 300 mg     clopidogrel (PLAVIX) 75 MG tablet Take 1 tablet (75 mg total) by mouth daily. 30 tablet 11   diltiazem (CARDIZEM) 30 MG tablet Take 1 tablet (30 mg total) by mouth 4 (four) times daily as needed (heart rates < 120 bpm). 360 tablet 1   Evolocumab (REPATHA SURECLICK) 140 MG/ML SOAJ Inject 140 mg into the skin every 14 (fourteen) days. 6 mL 0   fluticasone (FLONASE) 50 MCG/ACT nasal spray SPRAY ONE SPRAY IN EACH NOSTRIL ONCE DAILY (Patient taking differently: Place 1 spray into both nostrils daily.) 16 mL 3   levothyroxine (SYNTHROID) 50 MCG tablet Take 1 tablet (50 mcg total) by mouth daily. 90 tablet 2   mupirocin ointment (BACTROBAN) 2 % Apply 1 Application topically 3 (three) times daily.     pyridOXINE (VITAMIN B-6) 100 MG tablet Take 100 mg by mouth daily.     rosuvastatin (CRESTOR) 40 MG tablet TAKE 1 TABLET BY MOUTH DAILY 90 tablet 1   vitamin C (ASCORBIC ACID) 500 MG tablet Take 500 mg by mouth 2 (two) times daily.     vitamin E 400 UNIT capsule Take 400  Units by mouth daily.     No current facility-administered medications on file prior to visit.    Review of Systems     Objective:   Vitals:   01/28/23 1427  BP: 104/72  Pulse: 63  Temp: 98.1 F (36.7 C)  SpO2: 98%   BP Readings from Last 3 Encounters:  01/28/23 104/72  11/02/22 100/62  10/28/22 108/72   Wt Readings from Last 3 Encounters:  01/28/23 141 lb 3.2 oz (64 kg)  12/20/22 138 lb 14.2 oz (63 kg)  11/02/22 134 lb 12.8 oz (61.1 kg)   Body mass index is 21.63 kg/m.    Physical Exam Constitutional:      General: He is not in acute distress.    Appearance: Normal appearance. He is not ill-appearing.  HENT:     Head: Normocephalic and atraumatic.  Skin:    General: Skin is warm and dry.     Findings: Erythema (Mild erythema left lower medial leg.  Minimal scar from recent incision from when the abscess was drained.  No open wound.  Tenderness  below incision/scar.  No swelling) present.  Neurological:     Mental Status: He is alert.            Assessment & Plan:    See Problem List for Assessment and Plan of chronic medical problems.

## 2023-01-28 ENCOUNTER — Encounter: Payer: Self-pay | Admitting: Internal Medicine

## 2023-01-28 ENCOUNTER — Ambulatory Visit (INDEPENDENT_AMBULATORY_CARE_PROVIDER_SITE_OTHER): Payer: PPO | Admitting: Internal Medicine

## 2023-01-28 VITALS — BP 104/72 | HR 63 | Temp 98.1°F | Ht 67.75 in | Wt 141.2 lb

## 2023-01-28 DIAGNOSIS — L03116 Cellulitis of left lower limb: Secondary | ICD-10-CM | POA: Insufficient documentation

## 2023-01-28 NOTE — Assessment & Plan Note (Signed)
Acute Improved per him and his wife and it now looks like a mild infection Complete clindamycin, continue Bactroban ointment I gave him 7 days worth of antibiotic and depending on how this new looks upon completion of the antibiotic he will let me know-if there is still some residual infection we will plan on continuing for another 3-5 days Tolerating the antibiotic well without side effects

## 2023-01-28 NOTE — Patient Instructions (Addendum)
     Complete the antibiotic and let me know if the infection is not gone.       Return if symptoms worsen or fail to improve.

## 2023-01-30 ENCOUNTER — Encounter: Payer: Self-pay | Admitting: Internal Medicine

## 2023-01-31 DIAGNOSIS — M545 Low back pain, unspecified: Secondary | ICD-10-CM | POA: Diagnosis not present

## 2023-01-31 DIAGNOSIS — M542 Cervicalgia: Secondary | ICD-10-CM | POA: Diagnosis not present

## 2023-02-02 DIAGNOSIS — Z974 Presence of external hearing-aid: Secondary | ICD-10-CM | POA: Diagnosis not present

## 2023-02-02 DIAGNOSIS — H6123 Impacted cerumen, bilateral: Secondary | ICD-10-CM | POA: Diagnosis not present

## 2023-02-17 ENCOUNTER — Other Ambulatory Visit: Payer: Self-pay

## 2023-02-17 DIAGNOSIS — I214 Non-ST elevation (NSTEMI) myocardial infarction: Secondary | ICD-10-CM | POA: Diagnosis not present

## 2023-02-17 DIAGNOSIS — Z951 Presence of aortocoronary bypass graft: Secondary | ICD-10-CM | POA: Diagnosis not present

## 2023-02-17 DIAGNOSIS — E782 Mixed hyperlipidemia: Secondary | ICD-10-CM

## 2023-02-17 LAB — LIPID PANEL
Chol/HDL Ratio: 1.4 ratio (ref 0.0–5.0)
Cholesterol, Total: 109 mg/dL (ref 100–199)
HDL: 80 mg/dL (ref >39)
LDL Chol Calc (NIH): 19 mg/dL (ref 0–99)
Triglycerides: 35 mg/dL (ref 0–149)
VLDL Cholesterol Cal: 10 mg/dL (ref 5–40)

## 2023-02-19 ENCOUNTER — Encounter: Payer: Self-pay | Admitting: Internal Medicine

## 2023-02-19 DIAGNOSIS — R739 Hyperglycemia, unspecified: Secondary | ICD-10-CM

## 2023-02-19 DIAGNOSIS — I4891 Unspecified atrial fibrillation: Secondary | ICD-10-CM

## 2023-02-19 DIAGNOSIS — E038 Other specified hypothyroidism: Secondary | ICD-10-CM

## 2023-02-21 ENCOUNTER — Telehealth: Payer: Self-pay | Admitting: Internal Medicine

## 2023-02-21 NOTE — Telephone Encounter (Signed)
Just had labs for Lipid clinic. Do I need more labs before October appointment with you? Mary and I both had high dose flue shot a couple of weeks ago and are scheduled to get RSV and Covid from [facility] next week.  Pt msg above..    Evolocumab (REPATHA SURECLICK) 140 MG/ML SOAJ  Pt is asking about this medication as well. How do he get this refilled for him to take or is this medication is no longer needed?

## 2023-02-23 DIAGNOSIS — M542 Cervicalgia: Secondary | ICD-10-CM | POA: Diagnosis not present

## 2023-02-23 DIAGNOSIS — M545 Low back pain, unspecified: Secondary | ICD-10-CM | POA: Diagnosis not present

## 2023-02-24 NOTE — Telephone Encounter (Signed)
Mychart message sent to Dr. Lawerance Bach to address.

## 2023-02-25 ENCOUNTER — Other Ambulatory Visit: Payer: Self-pay | Admitting: Internal Medicine

## 2023-02-25 DIAGNOSIS — I214 Non-ST elevation (NSTEMI) myocardial infarction: Secondary | ICD-10-CM

## 2023-02-25 DIAGNOSIS — E782 Mixed hyperlipidemia: Secondary | ICD-10-CM

## 2023-02-25 DIAGNOSIS — Z951 Presence of aortocoronary bypass graft: Secondary | ICD-10-CM

## 2023-03-08 ENCOUNTER — Ambulatory Visit (INDEPENDENT_AMBULATORY_CARE_PROVIDER_SITE_OTHER): Payer: PPO

## 2023-03-08 ENCOUNTER — Other Ambulatory Visit: Payer: Self-pay | Admitting: Internal Medicine

## 2023-03-08 VITALS — Ht 67.75 in | Wt 141.0 lb

## 2023-03-08 DIAGNOSIS — Z0000 Encounter for general adult medical examination without abnormal findings: Secondary | ICD-10-CM

## 2023-03-08 NOTE — Progress Notes (Signed)
Subjective:   Jeremy Waters is a 77 y.o. male who presents for Medicare Annual/Subsequent preventive examination.  Visit Complete: Virtual I connected with  Jeremy Waters on 03/08/23 by a audio enabled telemedicine application and verified that I am speaking with the correct person using two identifiers.  Patient Location: Home  Provider Location: Home Office  I discussed the limitations of evaluation and management by telemedicine. The patient expressed understanding and agreed to proceed.  Vital Signs: Because this visit was a virtual/telehealth visit, some criteria may be missing or patient reported. Any vitals not documented were not able to be obtained and vitals that have been documented are patient reported.  Patient Medicare AWV questionnaire was completed by the patient on 03/07/2023; I have confirmed that all information answered by patient is correct and no changes since this date.  Because this visit was a virtual/telehealth visit, some criteria may be missing or patient reported. Any vitals not documented were not able to be obtained and vitals that have been documented are patient reported.    Cardiac Risk Factors include: advanced age (>64men, >72 women);male gender;Other (see comment);dyslipidemia, Risk factor comments: A-Fib, Atrial flutter     Objective:    Today's Vitals   03/08/23 0820  Weight: 141 lb (64 kg)  Height: 5' 7.75" (1.721 m)   Body mass index is 21.6 kg/m.     03/08/2023    8:25 AM 10/02/2022    1:54 AM 10/01/2022    9:15 PM 09/26/2022   10:36 AM 09/10/2022    8:08 AM 08/15/2022    4:20 PM 03/18/2022    9:21 AM  Advanced Directives  Does Patient Have a Medical Advance Directive? Yes Yes Yes No Yes Yes Yes  Type of Estate agent of [place];Living will Living will Living will  Healthcare Power of [employer] Power of [place];Living will Healthcare Power of [place];Living will  Does patient want to make changes  to medical advance directive?  No - Patient declined  No - Patient declined No - Patient declined No - Patient declined   Copy of Healthcare Power of Attorney in Chart? No - copy requested No - copy requested   No - copy requested No - copy requested No - copy requested  Would patient like information on creating a medical advance directive?  No - Patient declined  No - Patient declined       Current Medications (verified) Outpatient Encounter Medications as of 03/08/2023  Medication Sig   apixaban (ELIQUIS) 5 MG TABS tablet Take 1 tablet (5 mg total) by mouth 2 (two) times daily.   beta carotene 40981 UNIT capsule Take 25,000 Units by mouth daily.   cephALEXin (KEFLEX) 500 MG capsule cephALEXin Take 1 Capsule (oral) 3 times per day for 10 days [id number] capsule 3 times per day oral 10 days active 500 mg   clindamycin (CLEOCIN) 300 MG capsule clindamycin HCL Take 1 tablet (oral) 3 times per day for 7 days [id number] capsule 3 times per day oral 7 days active 300 mg   clopidogrel (PLAVIX) 75 MG tablet Take 1 tablet (75 mg total) by mouth daily.   diltiazem (CARDIZEM) 30 MG tablet Take 1 tablet (30 mg total) by mouth 4 (four) times daily as needed (heart rates < 120 bpm).   Evolocumab (REPATHA SURECLICK) 140 MG/ML SOAJ INJECT 140 MG INTO THE SKIN EVERY 14 DAYS   fluticasone (FLONASE) 50 MCG/ACT nasal spray SPRAY ONE SPRAY IN EACH NOSTRIL ONCE DAILY (Patient  taking differently: Place 1 spray into both nostrils daily.)   levothyroxine (SYNTHROID) 50 MCG tablet Take 1 tablet (50 mcg total) by mouth daily.   mupirocin ointment (BACTROBAN) 2 % Apply 1 Application topically 3 (three) times daily.   pyridOXINE (VITAMIN B-6) 100 MG tablet Take 100 mg by mouth daily.   rosuvastatin (CRESTOR) 40 MG tablet TAKE 1 TABLET BY MOUTH DAILY   vitamin C (ASCORBIC ACID) 500 MG tablet Take 500 mg by mouth 2 (two) times daily.   vitamin E 400 UNIT capsule Take 400 Units by mouth daily.   No facility-administered  encounter medications on file as of 03/08/2023.    Allergies (verified) Atorvastatin   History: Past Medical History:  Diagnosis Date   Adenomatous colon polyp 09/2007   Arthritis    OA / PAIN LEFT KNEE   Cancer (HCC)    prostate cancer   Cataract    removed both eyes   Coronary artery disease    COVID-19 virus infection    Diverticulosis of colon    w/o hemorrage   Heart murmur    TOLD HE HAS A SLIGHT MURMUR   Hyperlipidemia    Inguinal hernia    left side    Thyroid disease    Past Surgical History:  Procedure Laterality Date   AORTIC VALVE REPLACEMENT N/A 08/18/2022   Procedure: AORTIC VALVE REPLACEMENT (AVR) USING EDWARDS INSPIRIS AORTIC VALVE SIZE ;  Surgeon: Lyn Hollingshead, MD;  Location: [facility] OR;  Service: Open Heart Surgery;  Laterality: N/A;   BACK SURGERY  05/12/2005   L5   CARDIAC CATHETERIZATION     CLIPPING OF ATRIAL APPENDAGE N/A 08/18/2022   Procedure: CLIPPING OF ATRIAL APPENDAGE USING ATRICURE 45 ATRICLIP;  Surgeon: Lyn Hollingshead, MD;  Location: [facility] OR;  Service: Open Heart Surgery;  Laterality: N/A;  Median Sternotomy   COLONOSCOPY  1999   Negative; Dr Russella Dar   COLONOSCOPY  2004   tics, hemorrhoids   COLONOSCOPY  2009   polyps (T.A.)   CORONARY ARTERY BYPASS GRAFT N/A 08/18/2022   Procedure: CORONARY ARTERY BYPASS GRAFTING (CABG) X2 USING LEFT INTERNAL MAMMARY ARTERY AND LEFT ENDOSCOPICALLY HARVESTED GREATER SAPHENOUS VEIN.;  Surgeon: Lyn Hollingshead, MD;  Location: [facility] OR;  Service: Open Heart Surgery;  Laterality: N/A;   HERNIA REPAIR  1987   HERNIA REPAIR  10/2003   KNEE SURGERY  1969   Patellar fracture fragment Lt   LAMINECTOMY  2000   L4-5   LEFT HEART CATH AND CORONARY ANGIOGRAPHY N/A 08/16/2022   Procedure: LEFT HEART CATH AND CORONARY ANGIOGRAPHY;  Surgeon: Orbie Pyo, MD;  Location: [facility] INVASIVE [facility];  Service: Cardiovascular;  Laterality: N/A;   LUMBAR LAMINECTOMY/DECOMPRESSION MICRODISCECTOMY Right 10/08/2015   Procedure: MICRO  LUMBAR DECOMPRESSION L4-L5 AND L5-S1 ON RIGHT ;  Surgeon: Jene Every, MD;  Location: [facility] ORS;  Service: Orthopedics;  Laterality: Right;   MASS EXCISION  04/26/2011   Procedure: MINOR EXCISION OF MASS;  Surgeon: Velora Heckler, MD;  Location: [facility];  Service: General;  Laterality: Right;  Excise subcutaneous mass right axilla Minor Room   MAZE N/A 08/18/2022   Procedure: MAZE USING ATRICURE Oralia Manis;  Surgeon: Lyn Hollingshead, MD;  Location: [facility] OR;  Service: Open Heart Surgery;  Laterality: N/A;   POLYPECTOMY     PROSTATECTOMY  04/16/2005   TEE WITHOUT CARDIOVERSION N/A 08/18/2022   Procedure: TRANSESOPHAGEAL ECHOCARDIOGRAM;  Surgeon: Lyn Hollingshead, MD;  Location: [facility] OR;  Service:  Open Heart Surgery;  Laterality: N/A;   TONSILLECTOMY     TOTAL KNEE ARTHROPLASTY Left 07/03/2013   Procedure: LEFT TOTAL KNEE ARTHROPLASTY;  Surgeon: Shelda Pal, MD;  Location: [facility];  Service: Orthopedics;  Laterality: Left;   Family History  Problem Relation Age of Onset   Lung cancer Mother    COPD Mother    Cancer Father        Bladder Cancer   Prostate cancer Father    Colon cancer Father 35   Urolithiasis Father    Lung cancer Sister    Alcohol abuse Paternal Uncle    Stroke Paternal Uncle 32   Hypertension Paternal Uncle    Asthma Neg Hx    Heart disease Neg Hx    Esophageal cancer Neg Hx    Stomach cancer Neg Hx    Liver disease Neg Hx    Colon polyps Neg Hx    Rectal cancer Neg Hx    Social History   Socioeconomic History   Marital status: Married    Spouse name: Mary   Number of children: 2   Years of education: 16   Highest education level: Bachelor's degree (e.g., BA, AB, BS)  Occupational History   Occupation: insur exe    Employer: [employer]   Occupation: Retired  Tobacco Use   Smoking status: Former   Smokeless tobacco: Never   Tobacco comments:    stopped smoking cigarettes 1983, 1 cigar /day 2001-2013  Vaping Use   Vaping  status: Never Used  Substance and Sexual Activity   Alcohol use: Yes    Alcohol/week: 7.0 - 14.0 standard drinks of alcohol    Types: 7 - 14 Standard drinks or equivalent per week    Comment: scotch or wine daily   Drug use: No   Sexual activity: Yes  Other Topics Concern   Not on file  Social History Narrative   Exercises daily.  Lives with wife.  Has one puppy.            Social Determinants of Health   Financial Resource Strain: Low Risk  (03/07/2023)   Overall Financial Resource Strain (CARDIA)    Difficulty of Paying Living Expenses: Not hard at all  Food Insecurity: No Food Insecurity (03/07/2023)   Hunger Vital Sign    Worried About Running Out of Food in the Last Year: Never true    Ran Out of Food in the Last Year: Never true  Transportation Needs: Unknown (03/07/2023)   PRAPARE - Transportation    Lack of Transportation (Medical): No    Lack of Transportation (Non-Medical): Patient declined  Physical Activity: Sufficiently Active (03/07/2023)   Exercise Vital Sign    Days of Exercise per Week: 5 days    Minutes of Exercise per Session: 40 min  Stress: No Stress Concern Present (03/08/2023)   [employer] of Occupational Health - Occupational Stress Questionnaire    Feeling of Stress : Only a little  Social Connections: Socially Integrated (03/07/2023)   Social Connection and Isolation Panel [NHANES]    Frequency of Communication with Friends and Family: More than three times a week    Frequency of Social Gatherings with Friends and Family: Three times a week    Attends Religious Services: More than 4 times per year    Active Member of Clubs or Organizations: Yes    Attends Banker Meetings: More than 4 times per year    Marital Status: Married  Tobacco Counseling Counseling given: Not Answered Tobacco comments: stopped smoking cigarettes 1983, 1 cigar /day 2001-2013   Clinical Intake:  Pre-visit preparation completed: Yes  Pain :  No/denies pain     BMI - recorded: 21.6 Nutritional Status: BMI of 19-24  Normal Nutritional Risks: None Diabetes: No  How often do you need to have someone help you when you read instructions, pamphlets, or other written materials from your doctor or pharmacy?: 1 - Never  Interpreter Needed?: No  Information entered by :: Jamey Demchak, RMA   Activities of Daily Living    03/07/2023    9:18 AM 10/02/2022    1:54 AM  In your present state of health, do you have any difficulty performing the following activities:  Hearing? 1 1  Comment Wears Hearing aides   Vision? 0 0  Difficulty concentrating or making decisions? 0 0  Walking or climbing stairs? 0 0  Dressing or bathing? 0 0  Doing errands, shopping? 0 0  Preparing Food and eating ? N   Using the Toilet? N   In the past six months, have you accidently leaked urine? N   Do you have problems with loss of bowel control? N   Managing your Medications? N   Managing your Finances? N     Patient Care Team: Pincus Sanes, MD as PCP - General (Internal Medicine) Maisie Fus, MD as PCP - Cardiology (Cardiology) Christinia Gully, AUD (Audiology) Meryl Dare, MD as Consulting Physician (Gastroenterology) Nelson Chimes, MD as Consulting Physician (Ophthalmology) Erlene Quan, Vinnie Level, [facility] (Inactive) (Pharmacist)  Indicate any recent Medical Services you may have received from other than [facility] providers in the past year (date may be approximate).     Assessment:   This is a routine wellness examination for Shayden.  Hearing/Vision screen Hearing Screening - Comments:: Wears hearing aides Vision Screening - Comments:: Denies vision issues   Goals Addressed               This Visit's Progress     Patient Stated (pt-stated)        Want to stay healthy.      Depression Screen    03/08/2023    8:27 AM 12/20/2022    3:51 PM 12/20/2022    9:56 AM 09/21/2022   11:28 AM 09/06/2022   10:49 AM 07/29/2022    7:52 AM  03/18/2022    8:41 AM  PHQ 2/9 Scores  PHQ - 2 Score 0 0 0 0 0 0 0  PHQ- 9 Score 0 3 0 4  4     Fall Risk    03/08/2023    8:25 AM 12/20/2022    7:00 AM 11/29/2022    6:52 AM 11/26/2022    6:54 AM 11/24/2022    6:55 AM  Fall Risk   Falls in the past year? 1 0 0 0 0  Number falls in past yr: 0 0 0 0 0  Injury with Fall? 0 0 0 0 0  Risk for fall due to : No Fall Risks Impaired balance/gait Impaired balance/gait Impaired balance/gait Impaired balance/gait  Follow up Falls prevention discussed;Falls evaluation completed Falls evaluation completed Falls evaluation completed Falls evaluation completed Falls evaluation completed    MEDICARE RISK AT HOME: Medicare Risk at Home Any stairs in or around the home?: No Home free of loose throw rugs in walkways, pet beds, electrical cords, etc?: Yes Adequate lighting in your home to reduce risk of falls?: Yes Life alert?: No  Use of a cane, walker or w/c?: No Grab bars in the bathroom?: Yes Shower chair or bench in shower?: No Elevated toilet seat or a handicapped toilet?: No  TIMED UP AND GO:  Was the test performed?  No    Cognitive Function:    10/11/2017    4:37 PM  MMSE - Mini Mental State Exam  Orientation to time 5  Orientation to Place 5  Registration 3  Attention/ Calculation 5  Recall 3  Language- name 2 objects 2  Language- repeat 1  Language- follow 3 step command 3  Language- read & follow direction 1  Write a sentence 1  Copy design 1  Total score 30        03/08/2023    8:25 AM 03/18/2022    9:17 AM  6CIT Screen  What Year? 0 points 0 points  What month? 0 points 0 points  What time? 0 points 0 points  Count back from 20 0 points 0 points  Months in reverse 0 points 0 points  Repeat phrase 0 points 0 points  Total Score 0 points 0 points    Immunizations Immunization History  Administered Date(s) Administered   Fluad Quad(high Dose 65+) 01/17/2019, 02/11/2020, 02/11/2021, 02/11/2022, 01/31/2023    Hepatitis A 10/15/1997, 12/04/1997   Hepatitis B 09/09/2008, 10/15/2008, 08/01/2017   Influenza Split 03/16/2011, 03/03/2012   Influenza, High Dose Seasonal PF 02/09/2016, 03/03/2017, 02/23/2018   Influenza,inj,Quad PF,6+ Mos 03/08/2014, 02/27/2015   Influenza-Unspecified 03/07/2013   Meningococcal Conjugate 09/09/2008   PFIZER(Purple Top)SARS-COV-2 Vaccination 06/18/2019, 07/09/2019, 02/29/2020, 08/29/2020   Pfizer Covid-19 Vaccine Bivalent Booster 62yrs & up 04/17/2021, 02/11/2022   Pneumococcal Conjugate-13 11/20/2014   Pneumococcal Polysaccharide-23 06/07/2013   Tdap 05/24/2008, 08/01/2017   Typhoid Inactivated 09/04/1997, 07/30/2004, 08/01/2017   Yellow Fever 09/09/2008   Zoster Recombinant(Shingrix) 09/21/2016, 03/03/2017   Zoster, Live 03/18/2011    TDAP status: Up to date  Flu Vaccine status: Up to date  Pneumococcal vaccine status: Up to date  Covid-19 vaccine status: Completed vaccines  Qualifies for Shingles Vaccine? Yes   Zostavax completed Yes   Shingrix Completed?: Yes  Screening Tests Health Maintenance  Topic Date Due   COVID-19 Vaccine (7 - 2023-24 season) 01/23/2023   Medicare Annual Wellness (AWV)  03/07/2024   Colonoscopy  04/22/2024   DTaP/Tdap/Td (3 - Td or Tdap) 08/02/2027   Pneumonia Vaccine 38+ Years old  Completed   INFLUENZA VACCINE  Completed   Hepatitis C Screening  Completed   Zoster Vaccines- Shingrix  Completed   HPV VACCINES  Aged Out    Health Maintenance  Health Maintenance Due  Topic Date Due   COVID-19 Vaccine (7 - 2023-24 season) 01/23/2023    Colorectal cancer screening: Type of screening: Colonoscopy. Completed 04/22/2021. Repeat every 3 years  Lung Cancer Screening: (Low Dose CT Chest recommended if Age 36-80 years, 20 pack-year currently smoking OR have quit w/in 15years.) does not qualify.   Lung Cancer Screening Referral: N/A  Additional Screening:  Hepatitis C Screening: does qualify; Completed  04/28/2015  Vision Screening: Recommended annual ophthalmology exams for early detection of glaucoma and other disorders of the eye. Is the patient up to date with their annual eye exam?  Yes  Who is the provider or what is the name of the office in which the patient attends annual eye exams? Dr. Hazle Quant If pt is not established with a provider, would they like to be referred to a provider to establish care? No .   Dental  Screening: Recommended annual dental exams for proper oral hygiene   Community Resource Referral / Chronic Care Management: CRR required this visit?  No   CCM required this visit?  No     Plan:     I have personally reviewed and noted the following in the patient's chart:   Medical and social history Use of alcohol, tobacco or illicit drugs  Current medications and supplements including opioid prescriptions. Patient is not currently taking opioid prescriptions. Functional ability and status Nutritional status Physical activity Advanced directives List of other physicians Hospitalizations, surgeries, and ER visits in previous 12 months Vitals Screenings to include cognitive, depression, and falls Referrals and appointments  In addition, I have reviewed and discussed with patient certain preventive protocols, quality metrics, and best practice recommendations. A written personalized care plan for preventive services as well as general preventive health recommendations were provided to patient.     Cyriah Childrey L Paden Senger, CMA   03/08/2023   After Visit Summary: (MyChart) Due to this being a telephonic visit, the after visit summary with patients personalized plan was offered to patient via MyChart   Nurse Notes: Patient is up to date on all health maintenance.  He will wait to schedule for next year's AWV until later in the next year.  Patient had no concerns to address today.

## 2023-03-08 NOTE — Patient Instructions (Signed)
Jeremy Waters , Thank you for taking time to come for your Medicare Wellness Visit. I appreciate your ongoing commitment to your health goals. Please review the following plan we discussed and let me know if I can assist you in the future.   Referrals/Orders/Follow-Ups/Clinician Recommendations: Keep up the good work.  This is a list of the screening recommended for you and due dates:  Health Maintenance  Topic Date Due   COVID-19 Vaccine (7 - 2023-24 season) 01/23/2023   Medicare Annual Wellness Visit  03/07/2024   Colon Cancer Screening  04/22/2024   DTaP/Tdap/Td vaccine (3 - Td or Tdap) 08/02/2027   Pneumonia Vaccine  Completed   Flu Shot  Completed   Hepatitis C Screening  Completed   Zoster (Shingles) Vaccine  Completed   HPV Vaccine  Aged Out    Advanced directives: (Copy Requested) Please bring a copy of your health care power of attorney and living will to the office to be added to your chart at your convenience.  Next Medicare Annual Wellness Visit scheduled for next year: No

## 2023-03-15 ENCOUNTER — Encounter (HOSPITAL_BASED_OUTPATIENT_CLINIC_OR_DEPARTMENT_OTHER): Payer: Self-pay | Admitting: Emergency Medicine

## 2023-03-15 ENCOUNTER — Emergency Department (HOSPITAL_BASED_OUTPATIENT_CLINIC_OR_DEPARTMENT_OTHER)
Admission: EM | Admit: 2023-03-15 | Discharge: 2023-03-16 | Disposition: A | Discharge status: Home / Self Care | Payer: PPO | Attending: Emergency Medicine | Admitting: Emergency Medicine

## 2023-03-15 ENCOUNTER — Emergency Department (HOSPITAL_BASED_OUTPATIENT_CLINIC_OR_DEPARTMENT_OTHER): Payer: PPO

## 2023-03-15 DIAGNOSIS — Z951 Presence of aortocoronary bypass graft: Secondary | ICD-10-CM | POA: Insufficient documentation

## 2023-03-15 DIAGNOSIS — I2510 Atherosclerotic heart disease of native coronary artery without angina pectoris: Secondary | ICD-10-CM | POA: Diagnosis not present

## 2023-03-15 DIAGNOSIS — I10 Essential (primary) hypertension: Secondary | ICD-10-CM | POA: Diagnosis not present

## 2023-03-15 DIAGNOSIS — R079 Chest pain, unspecified: Secondary | ICD-10-CM | POA: Insufficient documentation

## 2023-03-15 DIAGNOSIS — R0789 Other chest pain: Secondary | ICD-10-CM | POA: Diagnosis not present

## 2023-03-15 DIAGNOSIS — Z7901 Long term (current) use of anticoagulants: Secondary | ICD-10-CM | POA: Diagnosis not present

## 2023-03-15 LAB — BASIC METABOLIC PANEL
Anion gap: 10 (ref 5–15)
BUN: 18 mg/dL (ref 8–23)
CO2: 23 mmol/L (ref 22–32)
Calcium: 9.9 mg/dL (ref 8.9–10.3)
Chloride: 105 mmol/L (ref 98–111)
Creatinine, Ser: 0.92 mg/dL (ref 0.61–1.24)
GFR, Estimated: >60 mL/min (ref >60)
Glucose, Bld: 85 mg/dL (ref 70–99)
Potassium: 4.3 mmol/L (ref 3.5–5.1)
Sodium: 138 mmol/L (ref 135–145)

## 2023-03-15 LAB — CBC
HCT: 39.7 % (ref 39.0–52.0)
Hemoglobin: 13.4 g/dL (ref 13.0–17.0)
MCH: 33.2 pg (ref 26.0–34.0)
MCHC: 33.8 g/dL (ref 30.0–36.0)
MCV: 98.3 fL (ref 80.0–100.0)
Platelets: 256 10*3/uL (ref 150–400)
RBC: 4.04 MIL/uL — ABNORMAL LOW (ref 4.22–5.81)
RDW: 14.2 % (ref 11.5–15.5)
WBC: 7 10*3/uL (ref 4.0–10.5)
nRBC: 0 % (ref 0.0–0.2)

## 2023-03-15 LAB — TROPONIN I (HIGH SENSITIVITY): Troponin I (High Sensitivity): 8 ng/L (ref <18)

## 2023-03-15 NOTE — ED Triage Notes (Signed)
Chest pain, woke up with pain last night Left side chest pain into arm. Continued through the day.  Recent Heart surgery in [place] travel

## 2023-03-15 NOTE — ED Provider Notes (Signed)
[place] EMERGENCY DEPARTMENT AT [facility] Provider Note   CSN: [id number] Arrival date & time: 03/15/23  2240     History {Add pertinent medical, surgical, social history, OB history to HPI:1} Chief Complaint  Patient presents with   Chest Pain    Jeremy Waters is a 77 y.o. male.   Chest Pain      Home Medications Prior to Admission medications   Medication Sig Start Date End Date Taking? Authorizing Provider  apixaban (ELIQUIS) 5 MG TABS tablet Take 1 tablet (5 mg total) by mouth 2 (two) times daily. 12/20/22   Maisie Fus, MD  beta carotene 88416 UNIT capsule Take 25,000 Units by mouth daily.    [provider]  cephALEXin (KEFLEX) 500 MG capsule cephALEXin Take 1 Capsule (oral) 3 times per day for 10 days [id number] capsule 3 times per day oral 10 days active 500 mg 01/18/23   [provider]  clindamycin (CLEOCIN) 300 MG capsule clindamycin HCL Take 1 tablet (oral) 3 times per day for 7 days [id number] capsule 3 times per day oral 7 days active 300 mg 01/24/23   [provider]  clopidogrel (PLAVIX) 75 MG tablet Take 1 tablet (75 mg total) by mouth daily. 08/26/22 08/26/23  Barrett, Erin R, PA-C  diltiazem (CARDIZEM) 30 MG tablet Take 1 tablet (30 mg total) by mouth 4 (four) times daily as needed (heart rates < 120 bpm). 11/30/22 05/29/23  Maisie Fus, MD  Evolocumab (REPATHA SURECLICK) 140 MG/ML SOAJ INJECT 140 MG INTO THE SKIN EVERY 14 DAYS 02/25/23   Maisie Fus, MD  fluticasone (FLONASE) 50 MCG/ACT nasal spray SPRAY ONE SPRAY IN EACH NOSTRIL ONCE DAILY Patient taking differently: Place 1 spray into both nostrils daily. 08/09/22   Pincus Sanes, MD  levothyroxine (SYNTHROID) 50 MCG tablet Take 1 tablet (50 mcg total) by mouth daily. 11/12/22   Pincus Sanes, MD  mupirocin ointment (BACTROBAN) 2 % Apply 1 Application topically 3 (three) times daily. 01/24/23   [provider]  pyridOXINE (VITAMIN B-6) 100 MG tablet Take 100 mg by  mouth daily.    [provider]  rosuvastatin (CRESTOR) 40 MG tablet TAKE 1 TABLET BY MOUTH DAILY 03/08/23   Pincus Sanes, MD  vitamin C (ASCORBIC ACID) 500 MG tablet Take 500 mg by mouth 2 (two) times daily.    [provider]  vitamin E 400 UNIT capsule Take 400 Units by mouth daily.    [provider]      Allergies    Atorvastatin    Review of Systems   Review of Systems  Cardiovascular:  Positive for chest pain.    Physical Exam Updated Vital Signs BP 110/79 (BP Location: Right Arm)   Pulse 64   Temp 98.2 F (36.8 C)   Resp 17   SpO2 99%  Physical Exam  ED Results / Procedures / Treatments   Labs (all labs ordered are listed, but only abnormal results are displayed) Labs Reviewed  BASIC METABOLIC PANEL  CBC  TROPONIN I (HIGH SENSITIVITY)    EKG None  Radiology No results found.  Procedures Procedures  {Document cardiac monitor, telemetry assessment procedure when appropriate:1}  Medications Ordered in ED Medications - No data to display  ED Course/ Medical Decision Making/ A&P   {   Click here for ABCD2, HEART and other calculatorsREFRESH Note before signing :1}  Medical Decision Making Amount and/or Complexity of Data Reviewed Labs: ordered. Radiology: ordered.   ***  {Document critical care time when appropriate:1} {Document review of labs and clinical decision tools ie heart score, Chads2Vasc2 etc:1}  {Document your independent review of radiology images, and any outside records:1} {Document your discussion with family members, caretakers, and with consultants:1} {Document social determinants of health affecting pt's care:1} {Document your decision making why or why not admission, treatments were needed:1} Final Clinical Impression(s) / ED Diagnoses Final diagnoses:  None    Rx / DC Orders ED Discharge Orders     None

## 2023-03-16 ENCOUNTER — Emergency Department (HOSPITAL_BASED_OUTPATIENT_CLINIC_OR_DEPARTMENT_OTHER): Payer: PPO

## 2023-03-16 DIAGNOSIS — R079 Chest pain, unspecified: Secondary | ICD-10-CM | POA: Diagnosis not present

## 2023-03-16 DIAGNOSIS — N2889 Other specified disorders of kidney and ureter: Secondary | ICD-10-CM | POA: Diagnosis not present

## 2023-03-16 DIAGNOSIS — R932 Abnormal findings on diagnostic imaging of liver and biliary tract: Secondary | ICD-10-CM | POA: Diagnosis not present

## 2023-03-16 DIAGNOSIS — K5730 Diverticulosis of large intestine without perforation or abscess without bleeding: Secondary | ICD-10-CM | POA: Diagnosis not present

## 2023-03-16 LAB — TSH: TSH: 3.292 u[IU]/mL (ref 0.350–4.500)

## 2023-03-16 LAB — MAGNESIUM: Magnesium: 2.3 mg/dL (ref 1.7–2.4)

## 2023-03-16 LAB — D-DIMER, QUANTITATIVE: D-Dimer, Quant: 0.43 ug{FEU}/mL (ref 0.00–0.50)

## 2023-03-16 LAB — TROPONIN I (HIGH SENSITIVITY): Troponin I (High Sensitivity): 8 ng/L (ref <18)

## 2023-03-16 LAB — T4, FREE: Free T4: 0.88 ng/dL (ref 0.61–1.12)

## 2023-03-16 MED ORDER — IOHEXOL 350 MG/ML SOLN
100.0000 mL | Freq: Once | INTRAVENOUS | Status: AC | PRN
  Administered 2023-03-16: 100 mL via INTRAVENOUS

## 2023-03-16 NOTE — ED Notes (Signed)
Patient transported to CT via stretcher with tech °

## 2023-03-16 NOTE — ED Notes (Signed)
ED Provider at bedside. 

## 2023-03-16 NOTE — Discharge Instructions (Addendum)
Your EKG, CT imaging and laboratory evaluation was overall reassuring.  Your chest pain symptoms have resolved.  Your CT imaging did show incidental findings of old rib fractures on the left.  Unclear if this is contributing to your discomfort.  There is no evidence that you are having an active heart attack or other acute emergency requiring inpatient hospitalization.  Please follow-up with your primary care physician regarding your symptoms, return for any severe worsening chest discomfort to include ripping or tearing chest discomfort radiating to the back and persistent symptoms of chest pressure and heaviness.  CTA Chest Abd Pel: IMPRESSION:  1. No acute vascular abnormality.  2. Age-indeterminate nondisplaced left anterolateral 3-5 rib  fractures. Correlate with point tenderness to palpation to evaluate  for an acute component.  3. Dilated extrarenal left renal pelvis. No ureterolithiasis this  may reflect the residual of a recently passed calculus or reflect  chronic changes of obstructive uropathy or reflux.  4. Nonobstructive punctate nephrolithiasis bilaterally.  5. Colonic diverticulosis with no acute diverticulitis.  6. Other imaging findings of potential clinical significance:  Cardiomegaly. Aortic valve replacement. Aortic Atherosclerosis  (ICD10-I70.0) including coronary calcification.

## 2023-03-17 ENCOUNTER — Other Ambulatory Visit (INDEPENDENT_AMBULATORY_CARE_PROVIDER_SITE_OTHER): Payer: PPO

## 2023-03-17 DIAGNOSIS — R739 Hyperglycemia, unspecified: Secondary | ICD-10-CM | POA: Diagnosis not present

## 2023-03-17 DIAGNOSIS — E038 Other specified hypothyroidism: Secondary | ICD-10-CM | POA: Diagnosis not present

## 2023-03-17 DIAGNOSIS — M542 Cervicalgia: Secondary | ICD-10-CM | POA: Diagnosis not present

## 2023-03-17 DIAGNOSIS — M545 Low back pain, unspecified: Secondary | ICD-10-CM | POA: Diagnosis not present

## 2023-03-17 DIAGNOSIS — I4891 Unspecified atrial fibrillation: Secondary | ICD-10-CM

## 2023-03-17 LAB — CBC WITH DIFFERENTIAL/PLATELET
Basophils Absolute: 0.1 10*3/uL (ref 0.0–0.1)
Basophils Relative: 1.1 % (ref 0.0–3.0)
Eosinophils Absolute: 0.1 10*3/uL (ref 0.0–0.7)
Eosinophils Relative: 2.1 % (ref 0.0–5.0)
HCT: 43.6 % (ref 39.0–52.0)
Hemoglobin: 14.4 g/dL (ref 13.0–17.0)
Lymphocytes Relative: 23 % (ref 12.0–46.0)
Lymphs Abs: 1.4 10*3/uL (ref 0.7–4.0)
MCHC: 33 g/dL (ref 30.0–36.0)
MCV: 99.6 fL (ref 78.0–100.0)
Monocytes Absolute: 0.9 10*3/uL (ref 0.1–1.0)
Monocytes Relative: 14.2 % — ABNORMAL HIGH (ref 3.0–12.0)
Neutro Abs: 3.6 10*3/uL (ref 1.4–7.7)
Neutrophils Relative %: 59.6 % (ref 43.0–77.0)
Platelets: 236 10*3/uL (ref 150.0–400.0)
RBC: 4.38 Mil/uL (ref 4.22–5.81)
RDW: 14.8 % (ref 11.5–15.5)
WBC: 6.1 10*3/uL (ref 4.0–10.5)

## 2023-03-17 LAB — BASIC METABOLIC PANEL
BUN: 15 mg/dL (ref 6–23)
CO2: 26 meq/L (ref 19–32)
Calcium: 9.8 mg/dL (ref 8.4–10.5)
Chloride: 105 meq/L (ref 96–112)
Creatinine, Ser: 0.98 mg/dL (ref 0.40–1.50)
GFR: 75.15 mL/min (ref >60.00)
Glucose, Bld: 83 mg/dL (ref 70–99)
Potassium: 4.8 meq/L (ref 3.5–5.1)
Sodium: 141 meq/L (ref 135–145)

## 2023-03-17 LAB — TSH: TSH: 2.57 u[IU]/mL (ref 0.35–5.50)

## 2023-03-17 LAB — HEMOGLOBIN A1C: Hgb A1c MFr Bld: 5.4 % (ref 4.6–6.5)

## 2023-03-20 ENCOUNTER — Encounter: Payer: Self-pay | Admitting: Internal Medicine

## 2023-03-21 ENCOUNTER — Encounter: Payer: Self-pay | Admitting: Internal Medicine

## 2023-03-21 DIAGNOSIS — I2510 Atherosclerotic heart disease of native coronary artery without angina pectoris: Secondary | ICD-10-CM | POA: Insufficient documentation

## 2023-03-21 NOTE — Patient Instructions (Addendum)
Medications changes include :   none    A referral was ordered and someone will call you to schedule an appointment.     Return in about 1 year (around 03/21/2024) for Physical Exam.   Health Maintenance, Male Adopting a healthy lifestyle and getting preventive care are important in promoting health and wellness. Ask your health care provider about: The right schedule for you to have regular tests and exams. Things you can do on your own to prevent diseases and keep yourself healthy. What should I know about diet, weight, and exercise? Eat a healthy diet  Eat a diet that includes plenty of vegetables, fruits, low-fat dairy products, and lean protein. Do not eat a lot of foods that are high in solid fats, added sugars, or sodium. Maintain a healthy weight Body mass index (BMI) is a measurement that can be used to identify possible weight problems. It estimates body fat based on height and weight. Your health care provider can help determine your BMI and help you achieve or maintain a healthy weight. Get regular exercise Get regular exercise. This is one of the most important things you can do for your health. Most adults should: Exercise for at least 150 minutes each week. The exercise should increase your heart rate and make you sweat (moderate-intensity exercise). Do strengthening exercises at least twice a week. This is in addition to the moderate-intensity exercise. Spend less time sitting. Even light physical activity can be beneficial. Watch cholesterol and blood lipids Have your blood tested for lipids and cholesterol at 76 years of age, then have this test every 5 years. You may need to have your cholesterol levels checked more often if: Your lipid or cholesterol levels are high. You are older than 76 years of age. You are at high risk for heart disease. What should I know about cancer screening? Many types of cancers can be detected early and may often be  prevented. Depending on your health history and family history, you may need to have cancer screening at various ages. This may include screening for: Colorectal cancer. Prostate cancer. Skin cancer. Lung cancer. What should I know about heart disease, diabetes, and high blood pressure? Blood pressure and heart disease High blood pressure causes heart disease and increases the risk of stroke. This is more likely to develop in people who have high blood pressure readings or are overweight. Talk with your health care provider about your target blood pressure readings. Have your blood pressure checked: Every 3-5 years if you are 13-82 years of age. Every year if you are 3 years old or older. If you are between the ages of 2 and 67 and are a current or former smoker, ask your health care provider if you should have a one-time screening for abdominal aortic aneurysm (AAA). Diabetes Have regular diabetes screenings. This checks your fasting blood sugar level. Have the screening done: Once every three years after age 54 if you are at a normal weight and have a low risk for diabetes. More often and at a younger age if you are overweight or have a high risk for diabetes. What should I know about preventing infection? Hepatitis B If you have a higher risk for hepatitis B, you should be screened for this virus. Talk with your health care provider to find out if you are at risk for hepatitis B infection. Hepatitis C Blood testing is recommended for: Everyone born from [birth year] through 1964. Anyone with known  risk factors for hepatitis C. Sexually transmitted infections (STIs) You should be screened each year for STIs, including gonorrhea and chlamydia, if: You are sexually active and are younger than 76 years of age. You are older than 76 years of age and your health care provider tells you that you are at risk for this type of infection. Your sexual activity has changed since you were last screened,  and you are at increased risk for chlamydia or gonorrhea. Ask your health care provider if you are at risk. Ask your health care provider about whether you are at high risk for HIV. Your health care provider may recommend a prescription medicine to help prevent HIV infection. If you choose to take medicine to prevent HIV, you should first get tested for HIV. You should then be tested every 3 months for as long as you are taking the medicine. Follow these instructions at home: Alcohol use Do not drink alcohol if your health care provider tells you not to drink. If you drink alcohol: Limit how much you have to 0-2 drinks a day. Know how much alcohol is in your drink. In the [place] one drink equals one 12 oz bottle of beer (355 mL), one 5 oz glass of wine (148 mL), or one 1 oz glass of hard liquor (44 mL). Lifestyle Do not use any products that contain nicotine or tobacco. These products include cigarettes, chewing tobacco, and vaping devices, such as e-cigarettes. If you need help quitting, ask your health care provider. Do not use street drugs. Do not share needles. Ask your health care provider for help if you need support or information about quitting drugs. General instructions Schedule regular health, dental, and eye exams. Stay current with your vaccines. Tell your health care provider if: You often feel depressed. You have ever been abused or do not feel safe at home. Summary Adopting a healthy lifestyle and getting preventive care are important in promoting health and wellness. Follow your health care provider's instructions about healthy diet, exercising, and getting tested or screened for diseases. Follow your health care provider's instructions on monitoring your cholesterol and blood pressure. This information is not intended to replace advice given to you by your health care provider. Make sure you discuss any questions you have with your health care provider. Document Revised:  09/29/2020 Document Reviewed: 09/29/2020 Elsevier Patient Education  2024 [employer].

## 2023-03-21 NOTE — Progress Notes (Unsigned)
Subjective:    Patient ID: Jeremy Waters, male    DOB: [birth year], 77 y.o.   MRN: [id number]     HPI Jeremy Waters is here for a physical exam and his chronic medical problems.  Stamina improving    Medications and allergies reviewed with patient and updated if appropriate.  Current Outpatient Medications on File Prior to Visit  Medication Sig Dispense Refill   apixaban (ELIQUIS) 5 MG TABS tablet Take 1 tablet (5 mg total) by mouth 2 (two) times daily. 180 tablet 1   beta carotene 19147 UNIT capsule Take 25,000 Units by mouth daily.     clopidogrel (PLAVIX) 75 MG tablet Take 1 tablet (75 mg total) by mouth daily. 30 tablet 11   diltiazem (CARDIZEM) 30 MG tablet Take 1 tablet (30 mg total) by mouth 4 (four) times daily as needed (heart rates < 120 bpm). 360 tablet 1   Evolocumab (REPATHA SURECLICK) 140 MG/ML SOAJ INJECT 140 MG INTO THE SKIN EVERY 14 DAYS 6 mL 3   fluticasone (FLONASE) 50 MCG/ACT nasal spray SPRAY ONE SPRAY IN EACH NOSTRIL ONCE DAILY (Patient taking differently: Place 1 spray into both nostrils daily.) 16 mL 3   levothyroxine (SYNTHROID) 50 MCG tablet Take 1 tablet (50 mcg total) by mouth daily. 90 tablet 2   mupirocin ointment (BACTROBAN) 2 % Apply 1 Application topically 3 (three) times daily.     pyridOXINE (VITAMIN B-6) 100 MG tablet Take 100 mg by mouth daily.     rosuvastatin (CRESTOR) 40 MG tablet TAKE 1 TABLET BY MOUTH DAILY 90 tablet 1   vitamin C (ASCORBIC ACID) 500 MG tablet Take 500 mg by mouth 2 (two) times daily.     vitamin E 400 UNIT capsule Take 400 Units by mouth daily.     No current facility-administered medications on file prior to visit.    Review of Systems  Constitutional:  Negative for fever.  Eyes:  Negative for visual disturbance.  Respiratory:  Negative for cough, shortness of breath and wheezing.   Cardiovascular:  Negative for chest pain, palpitations and leg swelling.  Gastrointestinal:  Negative for abdominal pain, blood in stool,  constipation and diarrhea.       Occ gerd  Genitourinary:  Negative for dysuria and hematuria.  Musculoskeletal:  Positive for arthralgias and back pain (stiffness).  Skin:  Negative for rash.  Neurological:  Positive for numbness (tingling in right hand, right foot occ - related to position - moving around resolves it). Negative for light-headedness and headaches.  Hematological:  Bruises/bleeds easily (easy bruising).  Psychiatric/Behavioral:  Negative for dysphoric mood. The patient is not nervous/anxious.        Objective:   Vitals:   03/22/23 0755  BP: 112/76  Pulse: 69  Temp: 98 F (36.7 C)  SpO2: 98%   Filed Weights   03/22/23 0755  Weight: 146 lb (66.2 kg)   Body mass index is 22.36 kg/m.  BP Readings from Last 3 Encounters:  03/22/23 112/76  03/16/23 130/76  01/28/23 104/72    Wt Readings from Last 3 Encounters:  03/22/23 146 lb (66.2 kg)  03/08/23 141 lb (64 kg)  01/28/23 141 lb 3.2 oz (64 kg)      Physical Exam Constitutional: He appears well-developed and well-nourished. No distress.  HENT:  Head: Normocephalic and atraumatic.  Right Ear: External ear normal.  Left Ear: External ear normal.  Normal ear canals and TM b/l  Mouth/Throat: Oropharynx is clear and moist. Eyes: Conjunctivae  and EOM are normal.  Neck: Neck supple. No tracheal deviation present. No thyromegaly present.  No carotid bruit  Cardiovascular: Normal rate, regular rhythm, normal heart sounds and intact distal pulses.   No murmur heard.  No lower extremity edema. Pulmonary/Chest: Effort normal and breath sounds normal. No respiratory distress. He has no wheezes. He has no rales.  Abdominal: Soft. He exhibits no distension. There is no tenderness.  Genitourinary: deferred  Lymphadenopathy:   He has no cervical adenopathy.  Skin: Skin is warm and dry. He is not diaphoretic.  Psychiatric: He has a normal mood and affect. His behavior is normal.         Assessment & Plan:    Physical exam: Screening blood work  reviewed Exercise   regular - 30-45 min elliptical, weights Weight  normal Substance abuse   none   Reviewed recommended immunizations.   Health Maintenance  Topic Date Due   COVID-19 Vaccine (7 - 2023-24 season) 01/23/2023   Medicare Annual Wellness (AWV)  03/07/2024   Colonoscopy  04/22/2024   DTaP/Tdap/Td (3 - Td or Tdap) 08/02/2027   Pneumonia Vaccine 15+ Years old  Completed   INFLUENZA VACCINE  Completed   Hepatitis C Screening  Completed   Zoster Vaccines- Shingrix  Completed   HPV VACCINES  Aged Out     See Problem List for Assessment and Plan of chronic medical problems.

## 2023-03-22 ENCOUNTER — Ambulatory Visit (INDEPENDENT_AMBULATORY_CARE_PROVIDER_SITE_OTHER): Payer: PPO | Admitting: Internal Medicine

## 2023-03-22 VITALS — BP 112/76 | HR 69 | Temp 98.0°F | Ht 67.75 in | Wt 146.0 lb

## 2023-03-22 DIAGNOSIS — Z0000 Encounter for general adult medical examination without abnormal findings: Secondary | ICD-10-CM

## 2023-03-22 DIAGNOSIS — I4891 Unspecified atrial fibrillation: Secondary | ICD-10-CM | POA: Diagnosis not present

## 2023-03-22 DIAGNOSIS — E038 Other specified hypothyroidism: Secondary | ICD-10-CM

## 2023-03-22 DIAGNOSIS — E782 Mixed hyperlipidemia: Secondary | ICD-10-CM | POA: Diagnosis not present

## 2023-03-22 DIAGNOSIS — I2510 Atherosclerotic heart disease of native coronary artery without angina pectoris: Secondary | ICD-10-CM

## 2023-03-22 DIAGNOSIS — H903 Sensorineural hearing loss, bilateral: Secondary | ICD-10-CM | POA: Diagnosis not present

## 2023-03-22 DIAGNOSIS — R739 Hyperglycemia, unspecified: Secondary | ICD-10-CM

## 2023-03-22 DIAGNOSIS — H6122 Impacted cerumen, left ear: Secondary | ICD-10-CM | POA: Diagnosis not present

## 2023-03-22 NOTE — Assessment & Plan Note (Signed)
Chronic Recent TSH within normal range Continue levothyroxine 50 mcg daily

## 2023-03-22 NOTE — Assessment & Plan Note (Signed)
Chronic Lab Results  Component Value Date   HGBA1C 5.4 03/17/2023   Sugars in the normal range Low sugar / carb diet Stressed regular exercise

## 2023-03-22 NOTE — Assessment & Plan Note (Signed)
Chronic LDL very well-controlled Continue Crestor 40 mg daily, Repatha 140 mg q. 14 days Will let cardiology determine if Crestor dose can be lowered

## 2023-03-22 NOTE — Assessment & Plan Note (Signed)
Chronic S/p CABG Denies symptoms consistent with angina Plan Plavix 75 mg daily, Eliquis 5 mg twice daily, Repatha 140 mg q. 14 days, Crestor 40 mg daily

## 2023-03-22 NOTE — Assessment & Plan Note (Signed)
Chronic Following with cardiology On Eliquis 5 mg twice daily and diltiazem 30 mg 4 times daily as needed CBC, BMP, TSH reviewed-all normal

## 2023-03-26 ENCOUNTER — Encounter: Payer: Self-pay | Admitting: Internal Medicine

## 2023-03-26 ENCOUNTER — Other Ambulatory Visit: Payer: Self-pay | Admitting: Internal Medicine

## 2023-03-28 MED ORDER — CLONAZEPAM 0.5 MG PO TABS: 0.5000 mg | ORAL_TABLET | Freq: Every evening | ORAL | 2 refills | Status: DC | PRN

## 2023-04-07 DIAGNOSIS — I4891 Unspecified atrial fibrillation: Secondary | ICD-10-CM | POA: Diagnosis not present

## 2023-04-07 DIAGNOSIS — Z8639 Personal history of other endocrine, nutritional and metabolic disease: Secondary | ICD-10-CM | POA: Diagnosis not present

## 2023-04-18 DIAGNOSIS — M545 Low back pain, unspecified: Secondary | ICD-10-CM | POA: Diagnosis not present

## 2023-04-18 DIAGNOSIS — M542 Cervicalgia: Secondary | ICD-10-CM | POA: Diagnosis not present

## 2023-04-27 DIAGNOSIS — H6123 Impacted cerumen, bilateral: Secondary | ICD-10-CM | POA: Diagnosis not present

## 2023-05-05 ENCOUNTER — Encounter: Payer: Self-pay | Admitting: Internal Medicine

## 2023-05-09 ENCOUNTER — Encounter: Payer: Self-pay | Admitting: Internal Medicine

## 2023-05-09 ENCOUNTER — Ambulatory Visit: Payer: PPO | Attending: Internal Medicine | Admitting: Internal Medicine

## 2023-05-09 VITALS — BP 122/72 | HR 61 | Resp 16 | Ht 67.0 in | Wt 149.0 lb

## 2023-05-09 DIAGNOSIS — I214 Non-ST elevation (NSTEMI) myocardial infarction: Secondary | ICD-10-CM

## 2023-05-09 DIAGNOSIS — Z952 Presence of prosthetic heart valve: Secondary | ICD-10-CM

## 2023-05-09 DIAGNOSIS — Z951 Presence of aortocoronary bypass graft: Secondary | ICD-10-CM

## 2023-05-09 DIAGNOSIS — M545 Low back pain, unspecified: Secondary | ICD-10-CM | POA: Diagnosis not present

## 2023-05-09 DIAGNOSIS — M542 Cervicalgia: Secondary | ICD-10-CM | POA: Diagnosis not present

## 2023-05-09 NOTE — Patient Instructions (Addendum)
Medication Instructions:  No changes  *If you need a refill on your cardiac medications before your next appointment, please call your pharmacy*    Testing/Procedures: CT Pulmonary vein   Follow-Up: At [facility], you and your health needs are our priority.  As part of our continuing mission to provide you with exceptional heart care, we have created designated Provider Care Teams.  These Care Teams include your primary Cardiologist (physician) and Advanced Practice Providers (APPs -  Physician Assistants and Nurse Practitioners) who all work together to provide you with the care you need, when you need it.  We recommend signing up for the patient portal called "MyChart".  Sign up information is provided on this After Visit Summary.  MyChart is used to connect with patients for Virtual Visits (Telemedicine).  Patients are able to view lab/test results, encounter notes, upcoming appointments, etc.  Non-urgent messages can be sent to your provider as well.   To learn more about what you can do with MyChart, go to [email or web address].    Your next appointment:   6 months  Provider:   Maisie Fus, MD

## 2023-05-09 NOTE — Progress Notes (Signed)
Cardiology Office Note:    Date:  05/09/2023   ID:  NEVAAN AHERNE, DOB [birth year], MRN [id number]  PCP:  Pincus Sanes, MD   [facility] Providers Cardiologist:  Maisie Fus, MD     Referring MD: Pincus Sanes, MD   No chief complaint on file. Ventricular Ectopy  History of Present Illness:    Jeremy Waters is a 77 y.o. male with a hx of smoking, prostate CA and prostatectomy TURP (no radiation), referred to cardiology for PVCs, recent COVID + early September.  He saw his PCP and was noted to have PVCs and bigeminy. He is asymptomatic. He notes some heart burn. He does 15 minutes to the elliptical. He denies dyspnea on exertion. He symptoms have seconds worth of chest tension. He smoked up to 1983, 20 years. He had a stress test a long time ago, it was normal. It was done on [facility]. 30 years ago possibly for life insurance. It was treadmill exercise. He denies angina, dyspnea on exertion, lower extremity edema, PND or orthopnea. He denies syncope. His COVID symptoms have resolved.  Family hx: Uncle that died of a stroke. No history of CVD.   EKG 02/11/2021 showed sinus rhythm with frequent PVCs with RBBB morphology c/f left sided origin  EKG 10/03/2015: Sinus bradycardia HR 59 bpm, no PVCs  Echocardiogram 03/05/2021: Normal LVEF, No RWMA. RV.No septal hypertrophy.Biatrial severe enlargement. Heavily calcified aortic valve. Mean gradient 13 mmHg  BL carotid US: 01/2019: no significant ICA stenosis  LDL 91 mg/dL  TSH 1.5  Interim 07/28/6438 He presented  to the ED in March, his apple watch notified him that he was in afib. He went to the ED was in RVR. He had troponin checked and it was 2,000, diagnosed with NSTEMI. EF was 50-55% and he had severe paradoxical low flow-low gradient AS. He had cath that showed ostial and prox LAD of 90%, mid RCA 80% lesion and distal 95%, Distal Lcx was 90%. He had 2V CABG to the SVG-RCA and LIMA-LAD. He is also s/p MAZE and LAA appendage  clip. He also underwent AV replacement with 23 mm Edwards Inspiris valve. He had some bradycardia and hypotension, BB was stopped. He was discharged on DAPT and no AC with LAA clip.  He presented  in early May with afib with RVR managed with IV dilt and converted to sinus brady rates 50s. Continue BB however while at home Bps were low and this was stopped. Otherwise, he had no significant issues.   Today, he presents post CABG. He is still doing cardiac rehab. He denies CP.  No palpitations. He walks 2-2.5 miles.  BP is normal.  Interval hx 05/09/2023 He is working out. No chest pain. No shortness. He denies palpitations.  He never had chest pain when he presented for his CABG; so he is concerned about what symptoms might arise represent coronary disease.   Current Medications: Current Outpatient Medications on File Prior to Visit  Medication Sig Dispense Refill   apixaban (ELIQUIS) 5 MG TABS tablet Take 1 tablet (5 mg total) by mouth 2 (two) times daily. 180 tablet 1   beta carotene 34742 UNIT capsule Take 25,000 Units by mouth daily.     clonazePAM (KLONOPIN) 0.5 MG tablet Take 1 tablet (0.5 mg total) by mouth at bedtime as needed for anxiety. 30 tablet 2   clopidogrel (PLAVIX) 75 MG tablet Take 1 tablet (75 mg total) by mouth daily. 30 tablet 11  diltiazem (CARDIZEM) 30 MG tablet Take 1 tablet (30 mg total) by mouth 4 (four) times daily as needed (heart rates < 120 bpm). 360 tablet 1   Evolocumab (REPATHA SURECLICK) 140 MG/ML SOAJ INJECT 140 MG INTO THE SKIN EVERY 14 DAYS 6 mL 3   fluticasone (FLONASE) 50 MCG/ACT nasal spray SPRAY ONE SPRAY IN EACH NOSTRIL ONCE DAILY (Patient taking differently: Place 1 spray into both nostrils daily.) 16 mL 3   levothyroxine (SYNTHROID) 50 MCG tablet Take 1 tablet (50 mcg total) by mouth daily. 90 tablet 2   mupirocin ointment (BACTROBAN) 2 % Apply 1 Application topically 3 (three) times daily.     pyridOXINE (VITAMIN B-6) 100 MG tablet Take 100 mg by mouth  daily.     rosuvastatin (CRESTOR) 40 MG tablet TAKE 1 TABLET BY MOUTH DAILY 90 tablet 1   vitamin C (ASCORBIC ACID) 500 MG tablet Take 500 mg by mouth 2 (two) times daily.     vitamin E 400 UNIT capsule Take 400 Units by mouth daily.     No current facility-administered medications on file prior to visit.     Allergies:   Atorvastatin    ROS:   Please see the history of present illness.     All other systems reviewed and are negative.  EKGs/Labs/Other Studies Reviewed:    The following studies were reviewed today:  TTE Echocardiogram 09/30/22 1. Left ventricular ejection fraction, by estimation, is 50 to 55%. The left ventricle has low normal function. Left ventricular diastolic  parameters were normal.  2. Right ventricular systolic function is mildly reduced. The right  ventricular size is normal. There is normal pulmonary artery systolic pressure.  3. Left atrial size was mildly dilated.  4. The mitral valve is normal in structure. Mild mitral valve regurgitation.  5. S/p AVR (23 mm Edwards Inspiris (March 2024). Difficult to visualize. Peak and mean gradients through the valve are 18 and 10 mm Hg respectively. The aortic valve has been repaired/replaced. Aortic valve regurgitation  is not visualized. There is a 23 mm Edwards Inspiris valve present in the aortic position. Procedure Date:08/18/22.  6. The inferior vena cava is normal in size with greater than 50%  respiratory variability, suggesting right atrial pressure of 3 mmHg.    LHC 08/16/2022   Ost LAD to Prox LAD lesion is 90% stenosed.   Dist RCA lesion is 95% stenosed.   Mid RCA lesion is 80% stenosed.   Dist Cx lesion is 90% stenosed.   Mid LAD lesion is 65% stenosed.   1.  High-grade serial lesions of right coronary artery. 2.  High-grade calcified proximal LAD stenosis; the LAD is a relatively calcified vessel and require long segment stenting peripheral revascularization. 3.  High-grade distal left circumflex  disease. 4.  LVEDP of 3 mmHg.   Recommendations: The results were reviewed with Dr. Allyson Sabal, a cardiothoracic surgical consult will be obtained.   EKG:  EKG is  ordered today.  The ekg ordered today demonstrates   03/10/2021-Sinus bradycardia, no PVCs  Recent Labs: 08/15/2022: B Natriuretic Peptide 352.2 10/28/2022: ALT 32 03/15/2023: Magnesium 2.3 03/17/2023: BUN 15; Creatinine, Ser 0.98; Hemoglobin 14.4; Platelets 236.0; Potassium 4.8; Sodium 141; TSH 2.57   Recent Lipid Panel    Component Value Date/Time   CHOL 109 02/17/2023 0913   CHOL 224 (H) 09/02/2014 0919   TRIG 35 02/17/2023 0913   TRIG 116 09/02/2014 0919   HDL 80 02/17/2023 0913   HDL 76 09/02/2014 0919  CHOLHDL 1.4 02/17/2023 0913   CHOLHDL 2.6 08/17/2022 0605   VLDL 13 08/17/2022 0605   LDLCALC 19 02/17/2023 0913   LDLCALC 125 (H) 09/02/2014 0919   LDLDIRECT 179.6 01/04/2013 0740     Risk Assessment/Calculations:       Physical Exam:    VS:  Vitals:   05/09/23 0811  BP: 122/72  Pulse: 61  Resp: 16  SpO2: 97%     Wt Readings from Last 3 Encounters:  03/22/23 146 lb (66.2 kg)  03/08/23 141 lb (64 kg)  01/28/23 141 lb 3.2 oz (64 kg)     GEN:  Well nourished, well developed in no acute distress HEENT: Normal NECK: No JVD; No carotid bruits CARDIAC: RRR, no murmurs, no rubs, gallops RESPIRATORY:  Clear to auscultation without rales, wheezing or rhonchi  ABDOMEN: Soft, non-tender, non-distended MUSCULOSKELETAL:  No edema; No deformity  SKIN: Warm and dry NEUROLOGIC:  Alert and oriented x 3 PSYCHIATRIC:  Normal affect   ASSESSMENT:   CAD s/p CABG: 2V 08/18/2022 . Presented with afib found to have elevated trops in March 2024. Cath showed significant ostial LAD and mid RCA disease. Lcx has distal disease. He had LIMA-LAD and SVG-RCA. No chest pain. Had ventricular ectopy fall 2022 but no symptoms -He is asymptomatic; discussed that if he is concerned about symptoms can call our nursing line -On  Plavix and Eliquis - off BB with hypotension - continue crestor 40 mg daily; LDL at gaol 19 mg/dL  Paroxysmal Atrial fibrillation : diagnosed 07/2022. s/p MAZE, s/p clipping of the atrial appendage 08/18/2022.  Low A-fib burden.  - in sinus rhythm -- changed DAPT to plavix and eliquis,plan for cardiac CT to assess LAA  and ensure it is clipped - cont eliquis 5 mg BID - off BB per above  Paradoxical Low flow-low gradient severe AS status post AVR; , heavily calcified: s/p AVR 08/18/2022. Mean gradient 10 mmHg.  PLAN:    In order of problems listed above:  CT cardiac pulmonary vein study, for LAA clipping Follow up 6 months      Medication Adjustments/Labs and Tests Ordered: Current medicines are reviewed at length with the patient today.  Concerns regarding  Signed, Maisie Fus, MD  05/09/2023 8:00 AM    [facility]

## 2023-05-27 ENCOUNTER — Ambulatory Visit (HOSPITAL_COMMUNITY): Payer: PPO

## 2023-05-30 ENCOUNTER — Encounter (HOSPITAL_COMMUNITY): Payer: Self-pay

## 2023-06-01 DIAGNOSIS — M542 Cervicalgia: Secondary | ICD-10-CM | POA: Diagnosis not present

## 2023-06-01 DIAGNOSIS — M545 Low back pain, unspecified: Secondary | ICD-10-CM | POA: Diagnosis not present

## 2023-06-02 ENCOUNTER — Ambulatory Visit (HOSPITAL_COMMUNITY)
Admission: RE | Admit: 2023-06-02 | Discharge: 2023-06-02 | Disposition: A | Discharge status: Home / Self Care | Payer: PPO | Source: Ambulatory Visit | Attending: Internal Medicine | Admitting: Internal Medicine

## 2023-06-02 DIAGNOSIS — I214 Non-ST elevation (NSTEMI) myocardial infarction: Secondary | ICD-10-CM | POA: Insufficient documentation

## 2023-06-02 DIAGNOSIS — Z951 Presence of aortocoronary bypass graft: Secondary | ICD-10-CM | POA: Insufficient documentation

## 2023-06-02 DIAGNOSIS — Z952 Presence of prosthetic heart valve: Secondary | ICD-10-CM | POA: Diagnosis not present

## 2023-06-02 MED ORDER — IOHEXOL 350 MG/ML SOLN
100.0000 mL | Freq: Once | INTRAVENOUS | Status: AC | PRN
  Administered 2023-06-02: 100 mL via INTRAVENOUS

## 2023-06-14 ENCOUNTER — Other Ambulatory Visit: Payer: Self-pay | Admitting: Internal Medicine

## 2023-06-14 DIAGNOSIS — I4891 Unspecified atrial fibrillation: Secondary | ICD-10-CM

## 2023-06-14 NOTE — Telephone Encounter (Signed)
Eliquis 5mg  refill request received. Patient is 78 years old, weight-67.6kg, Crea-0.98 on 03/17/23, Diagnosis-Afib, and last seen by Dr. Carolan Clines on 05/09/23. Dose is appropriate based on dosing criteria. Will send in refill to requested pharmacy.

## 2023-06-20 ENCOUNTER — Telehealth: Payer: Self-pay | Admitting: Internal Medicine

## 2023-06-20 NOTE — Telephone Encounter (Signed)
Copied from CRM [id number]. Topic: Clinical - Medication Refill >> Jun 20, 2023  4:48 PM Louie Casa B wrote: Most Recent Primary Care Visit:  Provider: Pincus Sanes  Department: [facility]  Visit Type: PHYSICAL  Date: 03/22/2023  Medication: levothyroxine (SYNTHROID) 50 MCG tablet   Has the patient contacted their pharmacy? Yes (Agent: If no, request that the patient contact the pharmacy for the refill. If patient does not wish to contact the pharmacy document the reason why and proceed with request.) (Agent: If yes, when and what did the pharmacy advise?)  Is this the correct pharmacy for this prescription? Yes If no, delete pharmacy and type the correct one.  This is the patient's preferred pharmacy:  [facility]  [address]  [phone]   Has the prescription been filled recently? Yes  Is the patient out of the medication? No  Has the patient been seen for an appointment in the last year OR does the patient have an upcoming appointment? No  Can we respond through MyChart? No  Agent: Please be advised that Rx refills may take up to 3 business days. We ask that you follow-up with your pharmacy.

## 2023-06-21 ENCOUNTER — Other Ambulatory Visit: Payer: Self-pay

## 2023-06-21 MED ORDER — LEVOTHYROXINE SODIUM 50 MCG PO TABS: 50.0000 ug | ORAL_TABLET | Freq: Every day | ORAL | 2 refills | Status: DC

## 2023-06-21 NOTE — Telephone Encounter (Signed)
Script sent in today

## 2023-06-22 DIAGNOSIS — M542 Cervicalgia: Secondary | ICD-10-CM | POA: Diagnosis not present

## 2023-06-22 DIAGNOSIS — M545 Low back pain, unspecified: Secondary | ICD-10-CM | POA: Diagnosis not present

## 2023-07-11 DIAGNOSIS — H6122 Impacted cerumen, left ear: Secondary | ICD-10-CM | POA: Diagnosis not present

## 2023-07-18 ENCOUNTER — Telehealth: Payer: Self-pay | Admitting: Pharmacy Technician

## 2023-07-18 ENCOUNTER — Other Ambulatory Visit (HOSPITAL_COMMUNITY): Payer: Self-pay

## 2023-07-18 NOTE — Telephone Encounter (Signed)
 Pharmacy Patient Advocate Encounter   Received notification from CoverMyMeds that prior authorization for Repatha is required/requested.   Insurance verification completed.   The patient is insured through [facility] ADVANTAGE/RX ADVANCE .   Per test claim: PA required; PA submitted to above mentioned insurance via CoverMyMeds Key/confirmation #/EOC [id number] Status is pending

## 2023-07-19 ENCOUNTER — Other Ambulatory Visit (HOSPITAL_COMMUNITY): Payer: Self-pay

## 2023-07-19 NOTE — Telephone Encounter (Signed)
 Pharmacy Patient Advocate Encounter  Received notification from [employer] ADVANTAGE/RX ADVANCE that Prior Authorization for Repatha has been APPROVED from 07/18/23 to 07/17/24   PA #/Case ID/Reference #: [id number]

## 2023-07-27 DIAGNOSIS — M545 Low back pain, unspecified: Secondary | ICD-10-CM | POA: Diagnosis not present

## 2023-07-27 DIAGNOSIS — M542 Cervicalgia: Secondary | ICD-10-CM | POA: Diagnosis not present

## 2023-08-08 ENCOUNTER — Encounter: Payer: Self-pay | Admitting: Internal Medicine

## 2023-08-09 DIAGNOSIS — H608X2 Other otitis externa, left ear: Secondary | ICD-10-CM | POA: Diagnosis not present

## 2023-08-16 DIAGNOSIS — D692 Other nonthrombocytopenic purpura: Secondary | ICD-10-CM | POA: Diagnosis not present

## 2023-08-16 DIAGNOSIS — Z85828 Personal history of other malignant neoplasm of skin: Secondary | ICD-10-CM | POA: Diagnosis not present

## 2023-08-17 ENCOUNTER — Other Ambulatory Visit: Payer: Self-pay | Admitting: Physician Assistant

## 2023-08-17 ENCOUNTER — Encounter: Payer: Self-pay | Admitting: Internal Medicine

## 2023-08-17 ENCOUNTER — Other Ambulatory Visit (HOSPITAL_COMMUNITY): Payer: Self-pay

## 2023-08-17 DIAGNOSIS — M542 Cervicalgia: Secondary | ICD-10-CM | POA: Diagnosis not present

## 2023-08-17 DIAGNOSIS — M545 Low back pain, unspecified: Secondary | ICD-10-CM | POA: Diagnosis not present

## 2023-08-17 MED ORDER — CLOPIDOGREL BISULFATE 75 MG PO TABS
75.0000 mg | ORAL_TABLET | Freq: Every day | ORAL | 12 refills | Status: DC
  Filled 2023-08-17: qty 30, 30d supply, fill #0

## 2023-08-17 MED ORDER — CLOPIDOGREL BISULFATE 75 MG PO TABS: 75.0000 mg | ORAL_TABLET | Freq: Every day | ORAL | 12 refills | Status: DC

## 2023-08-31 DIAGNOSIS — M542 Cervicalgia: Secondary | ICD-10-CM | POA: Diagnosis not present

## 2023-08-31 DIAGNOSIS — M545 Low back pain, unspecified: Secondary | ICD-10-CM | POA: Diagnosis not present

## 2023-09-02 ENCOUNTER — Other Ambulatory Visit: Payer: Self-pay | Admitting: Internal Medicine

## 2023-09-21 DIAGNOSIS — M545 Low back pain, unspecified: Secondary | ICD-10-CM | POA: Diagnosis not present

## 2023-09-21 DIAGNOSIS — M542 Cervicalgia: Secondary | ICD-10-CM | POA: Diagnosis not present

## 2023-10-05 DIAGNOSIS — M542 Cervicalgia: Secondary | ICD-10-CM | POA: Diagnosis not present

## 2023-10-05 DIAGNOSIS — M545 Low back pain, unspecified: Secondary | ICD-10-CM | POA: Diagnosis not present

## 2023-10-24 DIAGNOSIS — M545 Low back pain, unspecified: Secondary | ICD-10-CM | POA: Diagnosis not present

## 2023-10-24 DIAGNOSIS — M542 Cervicalgia: Secondary | ICD-10-CM | POA: Diagnosis not present

## 2023-10-27 ENCOUNTER — Encounter: Payer: Self-pay | Admitting: Cardiology

## 2023-10-27 NOTE — Telephone Encounter (Signed)
 This was a patient of Dr. Amanda Jungling that is going to be seeing you. Last BMP, CBC, TSH was performed on 03/17/23. Do you want to reorder these for patient to have before appointment or have pt wait until you see him?

## 2023-10-27 NOTE — Telephone Encounter (Signed)
 Reviewed 02/2023 labs, look good. None needed at this time.  Thanks MJP

## 2023-10-31 ENCOUNTER — Other Ambulatory Visit: Payer: Self-pay | Admitting: Internal Medicine

## 2023-10-31 NOTE — Progress Notes (Unsigned)
    Subjective:    Patient ID: Jeremy Waters, male    DOB: 1945-09-13, 78 y.o.   MRN: [id number]      HPI Khrystian is here for No chief complaint on file.   Discomfort around rib cage  Feeling out of sorts, woozy     Medications and allergies reviewed with patient and updated if appropriate.  Current Outpatient Medications on File Prior to Visit  Medication Sig Dispense Refill   apixaban  (ELIQUIS ) 5 MG TABS tablet TAKE 1 TABLET BY MOUTH 2 TIMES A DAY 180 tablet 1   beta carotene  25000 UNIT capsule Take 25,000 Units by mouth daily.     clonazePAM  (KLONOPIN ) 0.5 MG tablet Take 1 tablet (0.5 mg total) by mouth at bedtime as needed for anxiety. 30 tablet 2   clopidogrel  (PLAVIX ) 75 MG tablet Take 1 tablet (75 mg total) by mouth daily. 30 tablet 12   diltiazem  (CARDIZEM ) 30 MG tablet Take 1 tablet (30 mg total) by mouth 4 (four) times daily as needed (heart rates < 120 bpm). 360 tablet 1   Evolocumab  (REPATHA  SURECLICK) 140 MG/ML SOAJ INJECT 140 MG INTO THE SKIN EVERY 14 DAYS 6 mL 3   fluticasone  (FLONASE ) 50 MCG/ACT nasal spray SPRAY 1 SPRAY IN EACH NOSTRIL ONCE DAILY 16 mL 3   levothyroxine  (SYNTHROID ) 50 MCG tablet Take 1 tablet (50 mcg total) by mouth daily. 90 tablet 2   mupirocin  ointment (BACTROBAN ) 2 % Apply 1 Application topically 3 (three) times daily.     pyridOXINE  (VITAMIN B-6) 100 MG tablet Take 100 mg by mouth daily.     rosuvastatin  (CRESTOR ) 40 MG tablet TAKE 1 TABLET BY MOUTH DAILY 90 tablet 1   vitamin C  (ASCORBIC ACID ) 500 MG tablet Take 500 mg by mouth 2 (two) times daily.     vitamin E 400 UNIT capsule Take 400 Units by mouth daily.     No current facility-administered medications on file prior to visit.    Review of Systems     Objective:  There were no vitals filed for this visit. BP Readings from Last 3 Encounters:  06/02/23 139/74  05/09/23 122/72  03/22/23 112/76   Wt Readings from Last 3 Encounters:  05/09/23 149 lb (67.6 kg)  03/22/23 146 lb  (66.2 kg)  03/08/23 141 lb (64 kg)   There is no height or weight on file to calculate BMI.    Physical Exam         Assessment & Plan:    See Problem List for Assessment and Plan of chronic medical problems.

## 2023-11-01 ENCOUNTER — Encounter: Payer: Self-pay | Admitting: Internal Medicine

## 2023-11-01 ENCOUNTER — Ambulatory Visit (INDEPENDENT_AMBULATORY_CARE_PROVIDER_SITE_OTHER): Admitting: Internal Medicine

## 2023-11-01 ENCOUNTER — Ambulatory Visit: Payer: Self-pay | Admitting: Internal Medicine

## 2023-11-01 ENCOUNTER — Ambulatory Visit (INDEPENDENT_AMBULATORY_CARE_PROVIDER_SITE_OTHER)

## 2023-11-01 VITALS — BP 128/76 | HR 67 | Temp 98.1°F | Ht 67.0 in | Wt 153.0 lb

## 2023-11-01 DIAGNOSIS — R109 Unspecified abdominal pain: Secondary | ICD-10-CM | POA: Diagnosis not present

## 2023-11-01 DIAGNOSIS — N200 Calculus of kidney: Secondary | ICD-10-CM | POA: Diagnosis not present

## 2023-11-01 DIAGNOSIS — R0609 Other forms of dyspnea: Secondary | ICD-10-CM | POA: Insufficient documentation

## 2023-11-01 DIAGNOSIS — R1012 Left upper quadrant pain: Secondary | ICD-10-CM | POA: Diagnosis not present

## 2023-11-01 DIAGNOSIS — R0602 Shortness of breath: Secondary | ICD-10-CM | POA: Insufficient documentation

## 2023-11-01 DIAGNOSIS — E782 Mixed hyperlipidemia: Secondary | ICD-10-CM

## 2023-11-01 DIAGNOSIS — R5383 Other fatigue: Secondary | ICD-10-CM | POA: Diagnosis not present

## 2023-11-01 DIAGNOSIS — E038 Other specified hypothyroidism: Secondary | ICD-10-CM | POA: Diagnosis not present

## 2023-11-01 DIAGNOSIS — I878 Other specified disorders of veins: Secondary | ICD-10-CM | POA: Diagnosis not present

## 2023-11-01 NOTE — Assessment & Plan Note (Signed)
 Chronic LDL very well-controlled Continue Crestor  40 mg daily, Repatha  140 mg q. 14 days Check lipid panel

## 2023-11-01 NOTE — Assessment & Plan Note (Signed)
 Chronic Having some increased fatigue, shortness of breath Check TSH Continue levothyroxine  50 mcg daily

## 2023-11-01 NOTE — Assessment & Plan Note (Addendum)
 New Has baseline constipation and has been having regular bowel movements, 1-2 a day and does not feel constipated Having urge to have a bowel movement at times, but there has been no bowel movement Increased gas Left upper quadrant and left lower quadrant pressure/discomfort Did decrease how often he was taking the fiber and I think that has contributed Likely mild constipation Will increase the fiber again and that should hopefully improve/resolve Will check lipase given left upper quadrant discomfort, but I do not think this is pancreatitis If it is not improving he will let me know

## 2023-11-01 NOTE — Assessment & Plan Note (Signed)
 New Started several days ago Not on the elliptical as long, had shortness of breath walking up a hill which is unusual for him Also experiencing some fatigue ?  Cardiac versus other Has an appointment to see cardiology later this month Check CBC, CMP, TSH, BNP Chest x-ray today EKG today

## 2023-11-01 NOTE — Patient Instructions (Addendum)
      Blood work was ordered.   Have a xrays today.      Medications changes include :   increase the fiber

## 2023-11-01 NOTE — Assessment & Plan Note (Signed)
 New Started several days ago Much different from his baseline Associated with dyspnea on exertion Chest x-ray, EKG, blood work as above ?  Cardiac in nature Has an appointment with cardiology later this month

## 2023-11-02 ENCOUNTER — Encounter: Payer: Self-pay | Admitting: Internal Medicine

## 2023-11-02 ENCOUNTER — Other Ambulatory Visit

## 2023-11-02 DIAGNOSIS — R0609 Other forms of dyspnea: Secondary | ICD-10-CM | POA: Diagnosis not present

## 2023-11-03 LAB — BRAIN NATRIURETIC PEPTIDE: Brain Natriuretic Peptide: 182 pg/mL — ABNORMAL HIGH

## 2023-11-04 ENCOUNTER — Other Ambulatory Visit: Payer: Self-pay

## 2023-11-04 MED ORDER — FUROSEMIDE 20 MG PO TABS
20.0000 mg | ORAL_TABLET | Freq: Every day | ORAL | 0 refills | Status: DC | PRN
Start: 2023-11-04 — End: 2023-11-17

## 2023-11-06 ENCOUNTER — Ambulatory Visit: Payer: Self-pay | Admitting: Cardiology

## 2023-11-06 NOTE — Progress Notes (Signed)
 Happy to see him. We can keep him on lasix  20 mg daily until he sees me on 6/26.  Thanks [employer]

## 2023-11-08 ENCOUNTER — Inpatient Hospital Stay (HOSPITAL_COMMUNITY)
Admission: EM | Admit: 2023-11-08 | Discharge: 2023-11-09 | DRG: 309 | Disposition: A | Discharge status: Home / Self Care | Attending: Internal Medicine | Admitting: Internal Medicine

## 2023-11-08 ENCOUNTER — Other Ambulatory Visit: Payer: Self-pay

## 2023-11-08 ENCOUNTER — Emergency Department (HOSPITAL_COMMUNITY)

## 2023-11-08 ENCOUNTER — Telehealth: Payer: Self-pay

## 2023-11-08 ENCOUNTER — Encounter (HOSPITAL_COMMUNITY): Payer: Self-pay

## 2023-11-08 DIAGNOSIS — I252 Old myocardial infarction: Secondary | ICD-10-CM

## 2023-11-08 DIAGNOSIS — Z7902 Long term (current) use of antithrombotics/antiplatelets: Secondary | ICD-10-CM | POA: Diagnosis not present

## 2023-11-08 DIAGNOSIS — Z79890 Hormone replacement therapy (postmenopausal): Secondary | ICD-10-CM

## 2023-11-08 DIAGNOSIS — I4891 Unspecified atrial fibrillation: Principal | ICD-10-CM

## 2023-11-08 DIAGNOSIS — E785 Hyperlipidemia, unspecified: Secondary | ICD-10-CM | POA: Diagnosis present

## 2023-11-08 DIAGNOSIS — N200 Calculus of kidney: Secondary | ICD-10-CM | POA: Diagnosis not present

## 2023-11-08 DIAGNOSIS — I2510 Atherosclerotic heart disease of native coronary artery without angina pectoris: Secondary | ICD-10-CM | POA: Diagnosis present

## 2023-11-08 DIAGNOSIS — Z8249 Family history of ischemic heart disease and other diseases of the circulatory system: Secondary | ICD-10-CM

## 2023-11-08 DIAGNOSIS — I4819 Other persistent atrial fibrillation: Secondary | ICD-10-CM | POA: Diagnosis not present

## 2023-11-08 DIAGNOSIS — E038 Other specified hypothyroidism: Secondary | ICD-10-CM

## 2023-11-08 DIAGNOSIS — Z801 Family history of malignant neoplasm of trachea, bronchus and lung: Secondary | ICD-10-CM | POA: Diagnosis not present

## 2023-11-08 DIAGNOSIS — E039 Hypothyroidism, unspecified: Secondary | ICD-10-CM | POA: Diagnosis present

## 2023-11-08 DIAGNOSIS — Z8616 Personal history of COVID-19: Secondary | ICD-10-CM | POA: Diagnosis not present

## 2023-11-08 DIAGNOSIS — I5032 Chronic diastolic (congestive) heart failure: Secondary | ICD-10-CM | POA: Diagnosis not present

## 2023-11-08 DIAGNOSIS — Z952 Presence of prosthetic heart valve: Secondary | ICD-10-CM | POA: Diagnosis not present

## 2023-11-08 DIAGNOSIS — K5730 Diverticulosis of large intestine without perforation or abscess without bleeding: Secondary | ICD-10-CM | POA: Diagnosis not present

## 2023-11-08 DIAGNOSIS — Z8052 Family history of malignant neoplasm of bladder: Secondary | ICD-10-CM

## 2023-11-08 DIAGNOSIS — Z811 Family history of alcohol abuse and dependence: Secondary | ICD-10-CM

## 2023-11-08 DIAGNOSIS — Z1152 Encounter for screening for COVID-19: Secondary | ICD-10-CM

## 2023-11-08 DIAGNOSIS — Z825 Family history of asthma and other chronic lower respiratory diseases: Secondary | ICD-10-CM

## 2023-11-08 DIAGNOSIS — R0789 Other chest pain: Secondary | ICD-10-CM | POA: Diagnosis not present

## 2023-11-08 DIAGNOSIS — Z8546 Personal history of malignant neoplasm of prostate: Secondary | ICD-10-CM

## 2023-11-08 DIAGNOSIS — Z888 Allergy status to other drugs, medicaments and biological substances status: Secondary | ICD-10-CM

## 2023-11-08 DIAGNOSIS — R1012 Left upper quadrant pain: Secondary | ICD-10-CM | POA: Diagnosis not present

## 2023-11-08 DIAGNOSIS — Z860101 Personal history of adenomatous and serrated colon polyps: Secondary | ICD-10-CM | POA: Diagnosis not present

## 2023-11-08 DIAGNOSIS — I517 Cardiomegaly: Secondary | ICD-10-CM | POA: Diagnosis not present

## 2023-11-08 DIAGNOSIS — I4892 Unspecified atrial flutter: Secondary | ICD-10-CM | POA: Diagnosis not present

## 2023-11-08 DIAGNOSIS — R000 Tachycardia, unspecified: Secondary | ICD-10-CM | POA: Diagnosis not present

## 2023-11-08 DIAGNOSIS — Z96652 Presence of left artificial knee joint: Secondary | ICD-10-CM | POA: Diagnosis not present

## 2023-11-08 DIAGNOSIS — Z79899 Other long term (current) drug therapy: Secondary | ICD-10-CM

## 2023-11-08 DIAGNOSIS — Z87891 Personal history of nicotine dependence: Secondary | ICD-10-CM | POA: Diagnosis not present

## 2023-11-08 DIAGNOSIS — Z951 Presence of aortocoronary bypass graft: Secondary | ICD-10-CM | POA: Diagnosis not present

## 2023-11-08 DIAGNOSIS — R0602 Shortness of breath: Secondary | ICD-10-CM | POA: Diagnosis not present

## 2023-11-08 DIAGNOSIS — Z800 Family history of malignant neoplasm of digestive organs: Secondary | ICD-10-CM

## 2023-11-08 DIAGNOSIS — K7689 Other specified diseases of liver: Secondary | ICD-10-CM | POA: Diagnosis not present

## 2023-11-08 DIAGNOSIS — Z823 Family history of stroke: Secondary | ICD-10-CM

## 2023-11-08 DIAGNOSIS — Z8042 Family history of malignant neoplasm of prostate: Secondary | ICD-10-CM | POA: Diagnosis not present

## 2023-11-08 DIAGNOSIS — R109 Unspecified abdominal pain: Secondary | ICD-10-CM | POA: Diagnosis not present

## 2023-11-08 LAB — COMPREHENSIVE METABOLIC PANEL WITH GFR
ALT: 18 U/L (ref 0–44)
AST: 31 U/L (ref 15–41)
Albumin: 3.6 g/dL (ref 3.5–5.0)
Alkaline Phosphatase: 55 U/L (ref 38–126)
Anion gap: 12 (ref 5–15)
BUN: 17 mg/dL (ref 8–23)
CO2: 23 mmol/L (ref 22–32)
Calcium: 9.5 mg/dL (ref 8.9–10.3)
Chloride: 103 mmol/L (ref 98–111)
Creatinine, Ser: 1.14 mg/dL (ref 0.61–1.24)
GFR, Estimated: >60 mL/min (ref >60)
Glucose, Bld: 126 mg/dL — ABNORMAL HIGH (ref 70–99)
Potassium: 3.8 mmol/L (ref 3.5–5.1)
Sodium: 138 mmol/L (ref 135–145)
Total Bilirubin: 0.9 mg/dL (ref 0.0–1.2)
Total Protein: 7 g/dL (ref 6.5–8.1)

## 2023-11-08 LAB — URINALYSIS, W/ REFLEX TO CULTURE (INFECTION SUSPECTED)
Bilirubin Urine: NEGATIVE
Glucose, UA: NEGATIVE mg/dL
Hgb urine dipstick: NEGATIVE
Ketones, ur: NEGATIVE mg/dL
Leukocytes,Ua: NEGATIVE
Nitrite: NEGATIVE
Protein, ur: 30 mg/dL — AB
Specific Gravity, Urine: 1.016 (ref 1.005–1.030)
pH: 5 (ref 5.0–8.0)

## 2023-11-08 LAB — CBC WITH DIFFERENTIAL/PLATELET
Abs Immature Granulocytes: 0.03 10*3/uL (ref 0.00–0.07)
Basophils Absolute: 0.1 10*3/uL (ref 0.0–0.1)
Basophils Relative: 1 %
Eosinophils Absolute: 0.1 10*3/uL (ref 0.0–0.5)
Eosinophils Relative: 1 %
HCT: 41.6 % (ref 39.0–52.0)
Hemoglobin: 14.2 g/dL (ref 13.0–17.0)
Immature Granulocytes: 0 %
Lymphocytes Relative: 17 %
Lymphs Abs: 1.4 10*3/uL (ref 0.7–4.0)
MCH: 33.4 pg (ref 26.0–34.0)
MCHC: 34.1 g/dL (ref 30.0–36.0)
MCV: 97.9 fL (ref 80.0–100.0)
Monocytes Absolute: 1.7 10*3/uL — ABNORMAL HIGH (ref 0.1–1.0)
Monocytes Relative: 20 %
Neutro Abs: 5.1 10*3/uL (ref 1.7–7.7)
Neutrophils Relative %: 61 %
Platelets: 235 10*3/uL (ref 150–400)
RBC: 4.25 MIL/uL (ref 4.22–5.81)
RDW: 13.9 % (ref 11.5–15.5)
WBC: 8.3 10*3/uL (ref 4.0–10.5)
nRBC: 0 % (ref 0.0–0.2)

## 2023-11-08 LAB — LIPASE, BLOOD: Lipase: 34 U/L (ref 11–51)

## 2023-11-08 LAB — TROPONIN I (HIGH SENSITIVITY)
Troponin I (High Sensitivity): 3 ng/L (ref <18)
Troponin I (High Sensitivity): 4 ng/L (ref <18)

## 2023-11-08 LAB — MAGNESIUM: Magnesium: 2.4 mg/dL (ref 1.7–2.4)

## 2023-11-08 LAB — TSH: TSH: 1.288 u[IU]/mL (ref 0.350–4.500)

## 2023-11-08 LAB — RESP PANEL BY RT-PCR (RSV, FLU A&B, COVID)  RVPGX2
Influenza A by PCR: NEGATIVE
Influenza B by PCR: NEGATIVE
Resp Syncytial Virus by PCR: NEGATIVE
SARS Coronavirus 2 by RT PCR: NEGATIVE

## 2023-11-08 LAB — BRAIN NATRIURETIC PEPTIDE: B Natriuretic Peptide: 277.9 pg/mL — ABNORMAL HIGH (ref 0.0–100.0)

## 2023-11-08 LAB — T4, FREE: Free T4: 0.95 ng/dL (ref 0.61–1.12)

## 2023-11-08 LAB — I-STAT CG4 LACTIC ACID, ED
Lactic Acid, Venous: 1.4 mmol/L (ref 0.5–1.9)
Lactic Acid, Venous: 1.9 mmol/L (ref 0.5–1.9)

## 2023-11-08 MED ORDER — DILTIAZEM LOAD VIA INFUSION
10.0000 mg | Freq: Once | INTRAVENOUS | Status: AC
  Administered 2023-11-08: 10 mg via INTRAVENOUS
  Filled 2023-11-08: qty 10

## 2023-11-08 MED ORDER — ONDANSETRON HCL 4 MG PO TABS: 4.0000 mg | ORAL_TABLET | Freq: Four times a day (QID) | ORAL | Status: DC | PRN

## 2023-11-08 MED ORDER — ACETAMINOPHEN 650 MG RE SUPP: 650.0000 mg | Freq: Four times a day (QID) | RECTAL | Status: DC | PRN

## 2023-11-08 MED ORDER — SODIUM CHLORIDE 0.9% FLUSH
3.0000 mL | Freq: Two times a day (BID) | INTRAVENOUS | Status: DC
  Administered 2023-11-09: 3 mL via INTRAVENOUS

## 2023-11-08 MED ORDER — SODIUM CHLORIDE 0.9 % IV BOLUS
500.0000 mL | Freq: Once | INTRAVENOUS | Status: AC
  Administered 2023-11-08: 500 mL via INTRAVENOUS

## 2023-11-08 MED ORDER — CLOPIDOGREL BISULFATE 75 MG PO TABS
75.0000 mg | ORAL_TABLET | Freq: Every day | ORAL | Status: DC
  Administered 2023-11-09: 75 mg via ORAL
  Filled 2023-11-08: qty 1

## 2023-11-08 MED ORDER — DILTIAZEM HCL-DEXTROSE 125-5 MG/125ML-% IV SOLN (PREMIX)
5.0000 mg/h | INTRAVENOUS | Status: DC
  Administered 2023-11-08: 5 mg/h via INTRAVENOUS
  Filled 2023-11-08: qty 125

## 2023-11-08 MED ORDER — ACETAMINOPHEN 325 MG PO TABS: 650.0000 mg | ORAL_TABLET | Freq: Four times a day (QID) | ORAL | Status: DC | PRN

## 2023-11-08 MED ORDER — CLONAZEPAM 0.5 MG PO TABS: 0.5000 mg | ORAL_TABLET | Freq: Every evening | ORAL | Status: DC | PRN

## 2023-11-08 MED ORDER — LEVOTHYROXINE SODIUM 25 MCG PO TABS
50.0000 ug | ORAL_TABLET | Freq: Every day | ORAL | Status: DC
  Administered 2023-11-09: 50 ug via ORAL
  Filled 2023-11-08: qty 2

## 2023-11-08 MED ORDER — ENOXAPARIN SODIUM 40 MG/0.4ML IJ SOSY
40.0000 mg | PREFILLED_SYRINGE | INTRAMUSCULAR | Status: DC
  Administered 2023-11-08: 40 mg via SUBCUTANEOUS
  Filled 2023-11-08: qty 0.4

## 2023-11-08 MED ORDER — ONDANSETRON HCL 4 MG/2ML IJ SOLN: 4.0000 mg | Freq: Four times a day (QID) | INTRAMUSCULAR | Status: DC | PRN

## 2023-11-08 MED ORDER — OXYCODONE HCL 5 MG PO TABS: 5.0000 mg | ORAL_TABLET | ORAL | Status: DC | PRN

## 2023-11-08 MED ORDER — ROSUVASTATIN CALCIUM 20 MG PO TABS
40.0000 mg | ORAL_TABLET | Freq: Every evening | ORAL | Status: DC
  Administered 2023-11-08: 40 mg via ORAL
  Filled 2023-11-08: qty 2

## 2023-11-08 MED ORDER — IOHEXOL 350 MG/ML SOLN
65.0000 mL | Freq: Once | INTRAVENOUS | Status: AC | PRN
  Administered 2023-11-08: 65 mL via INTRAVENOUS

## 2023-11-08 MED ORDER — POLYETHYLENE GLYCOL 3350 17 G PO PACK: 17.0000 g | PACK | Freq: Every day | ORAL | Status: DC | PRN

## 2023-11-08 NOTE — ED Triage Notes (Signed)
 Pt reports tightness in his chest x 2 days associated with shortness of breath. His PCP sent him here for HR 140s. Hx of cardiac valve replacement.

## 2023-11-08 NOTE — ED Provider Notes (Cosign Needed)
 [place] [facility] AT [facility] Provider Note   CSN: [id number] Arrival date & time: 11/08/23  1519    Patient presents with: Chest Pain   Jeremy Waters is a 78 y.o. male history of NSTEMI, A-fib, CABG, CAD here for evaluation of shortness of breath.  Patient states 2 to 3 weeks he has had pain to his left upper abdomen.  He states he is having bowel movement and passing flatus.  No fever, nausea, vomiting, new back pain, dysuria, hematuria, blood in stool.  Eating and drinking without difficulty.  Some increased fatigue with no energy.  Saw his PCP on 11/01/2023.  Noted some DOE. 3 to 4 days ago he noted worsening shortness of breath.  Feels weird in his chest.  Follow-up PCP had an EKG as well as chest x-ray which did not show any significant abnormality.  Currently had a BNP which is mildly elevated and he was started on Lasix .  He is post follow-up with cardiology in the next few weeks.  Came today due to worsening symptoms.  No recent falls or injuries.  No states he feels like he cannot take a deep breath.  No exertional chest pain.  No pain or swelling lower extremities.  No PND orthopnea.  He denies any prior history of CHF.  He is not anticoagulated.  He states he is not on any medications for atrial fibrillation states that he burned it.  He apparently was on a medication at some point which caused some dizziness and low blood pressures which medications were subsequently stopped.  NSTEMI follow-up last year.  Unsure if feels similar.  No palpitations.  He does not typically feel when he is in A-fib.   HPI     Prior to Admission medications   Medication Sig Start Date End Date Taking? Authorizing Provider  beta carotene  25000 UNIT capsule Take 25,000 Units by mouth daily.   Yes [provider]  clonazePAM  (KLONOPIN ) 0.5 MG tablet Take 1 tablet (0.5 mg total) by mouth at bedtime as needed for anxiety. 03/28/23  Yes Burns, Beckey Bourgeois, MD  clopidogrel   (PLAVIX ) 75 MG tablet Take 1 tablet (75 mg total) by mouth daily. 08/17/23 08/16/24 Yes Bridgette Campus, MD  Evolocumab  (REPATHA  SURECLICK) 140 MG/ML SOAJ INJECT 140 MG INTO THE SKIN EVERY 14 DAYS 02/25/23  Yes Bridgette Campus, MD  fluticasone  (FLONASE ) 50 MCG/ACT nasal spray SPRAY 1 SPRAY IN EACH NOSTRIL ONCE DAILY 10/31/23  Yes Burns, Beckey Bourgeois, MD  furosemide  (LASIX ) 20 MG tablet Take 1 tablet (20 mg total) by mouth daily as needed. Patient taking differently: Take 20 mg by mouth daily as needed for fluid. 11/04/23  Yes Burns, Beckey Bourgeois, MD  levothyroxine  (SYNTHROID ) 50 MCG tablet Take 1 tablet (50 mcg total) by mouth daily. 06/21/23  Yes Burns, Beckey Bourgeois, MD  pyridOXINE  (VITAMIN B-6) 100 MG tablet Take 100 mg by mouth daily.   Yes [provider]  rosuvastatin  (CRESTOR ) 40 MG tablet TAKE 1 TABLET BY MOUTH DAILY Patient taking differently: Take 40 mg by mouth every evening. 09/02/23  Yes Burns, Beckey Bourgeois, MD  vitamin C  (ASCORBIC ACID ) 500 MG tablet Take 500 mg by mouth 2 (two) times daily.   Yes [provider]  vitamin E 400 UNIT capsule Take 400 Units by mouth daily.   Yes [provider]  diltiazem  (CARDIZEM ) 30 MG tablet Take 1 tablet (30 mg total) by mouth 4 (four) times daily as needed (heart rates <  120 bpm). 11/30/22 05/29/23  Bridgette Campus, MD    Allergies: Atorvastatin    Review of Systems  Constitutional: Negative.   HENT: Negative.    Respiratory:  Positive for shortness of breath. Negative for apnea, cough, choking, chest tightness and wheezing.   Cardiovascular:  Negative for palpitations and leg swelling.  Gastrointestinal:  Positive for abdominal pain. Negative for abdominal distention, anal bleeding, blood in stool, constipation, diarrhea, nausea, rectal pain and vomiting.  Genitourinary: Negative.   Musculoskeletal: Negative.   Skin: Negative.   Neurological:  Positive for weakness (generalized).  All other systems reviewed and are negative.   Updated Vital  Signs BP 113/84   Pulse 71   Temp 98.1 F (36.7 C) (Oral)   Resp 14   Ht 5' 7 (1.702 m)   Wt 69.4 kg   SpO2 100%   BMI 23.96 kg/m   Physical Exam Vitals and nursing note reviewed.  Constitutional:      General: He is not in acute distress.    Appearance: He is well-developed. He is not ill-appearing, toxic-appearing or diaphoretic.     Comments: Hard of hearing  HENT:     Head: Normocephalic and atraumatic.   Eyes:     Pupils: Pupils are equal, round, and reactive to light.    Cardiovascular:     Rate and Rhythm: Tachycardia present. Rhythm irregular.     Pulses:          Radial pulses are 2+ on the right side and 2+ on the left side.       Posterior tibial pulses are 2+ on the right side and 2+ on the left side.     Heart sounds: Normal heart sounds.  Pulmonary:     Effort: Pulmonary effort is normal. No respiratory distress.     Breath sounds: Normal breath sounds.     Comments: Clear bilaterally, speaks in full sentences without difficulty Chest:     Comments: Nontender chest wall Abdominal:     General: Bowel sounds are normal. There is no distension.     Palpations: Abdomen is soft.     Tenderness: There is abdominal tenderness.     Comments: Mild tenderness left abdomen, negative CVA tap.  No rebound or guarding.  No overlying skin changes.   Musculoskeletal:        General: Normal range of motion.     Cervical back: Normal range of motion and neck supple.     Right lower leg: No tenderness. No edema.     Left lower leg: No tenderness. No edema.     Comments: No bony tenderness, compartments soft, full range of motion.  No lower extremity edema.   Skin:    General: Skin is warm and dry.     Capillary Refill: Capillary refill takes less than 2 seconds.     Comments: No obvious rashes or lesions on exposed skin.   Neurological:     General: No focal deficit present.     Mental Status: He is alert and oriented to person, place, and time.     (all labs  ordered are listed, but only abnormal results are displayed) Labs Reviewed  COMPREHENSIVE METABOLIC PANEL WITH GFR - Abnormal; Notable for the following components:      Result Value   Glucose, Bld 126 (*)    All other components within normal limits  URINALYSIS, W/ REFLEX TO CULTURE (INFECTION SUSPECTED) - Abnormal; Notable for the following components:   APPearance HAZY (*)  Protein, ur 30 (*)    Bacteria, UA RARE (*)    All other components within normal limits  CBC WITH DIFFERENTIAL/PLATELET - Abnormal; Notable for the following components:   Monocytes Absolute 1.7 (*)    All other components within normal limits  BRAIN NATRIURETIC PEPTIDE - Abnormal; Notable for the following components:   B Natriuretic Peptide 277.9 (*)    All other components within normal limits  RESP PANEL BY RT-PCR (RSV, FLU A&B, COVID)  RVPGX2  TSH  T4, FREE  LIPASE, BLOOD  MAGNESIUM   BASIC METABOLIC PANEL WITH GFR  CBC  I-STAT CG4 LACTIC ACID, ED  I-STAT CG4 LACTIC ACID, ED  TROPONIN I (HIGH SENSITIVITY)  TROPONIN I (HIGH SENSITIVITY)    EKG: None  Radiology: CT Angio Chest PE W and/or Wo Contrast Result Date: 11/08/2023 CLINICAL DATA:  Left upper abdominal pain, chest tightness for 2 days, short of breath, tachycardia EXAM: CT ANGIOGRAPHY CHEST WITH CONTRAST TECHNIQUE: Multidetector CT imaging of the chest was performed using the standard protocol during bolus administration of intravenous contrast. Multiplanar CT image reconstructions and MIPs were obtained to evaluate the vascular anatomy. RADIATION DOSE REDUCTION: This exam was performed according to the departmental dose-optimization program which includes automated exposure control, adjustment of the mA and/or kV according to patient size and/or use of iterative reconstruction technique. CONTRAST:  65mL OMNIPAQUE  IOHEXOL  350 MG/ML SOLN COMPARISON:  06/02/2023, 11/08/2023 FINDINGS: Cardiovascular: This is a technically adequate evaluation of the  pulmonary vasculature. No filling defects or pulmonary emboli. No pericardial effusion. Stable left atrial dilatation. Occlusion device within the left atrial appendage. Aortic valve prosthesis identified. No evidence of thoracic aortic aneurysm or dissection. Atherosclerosis of the aorta and native coronary vasculature. Previous CABG. Mediastinum/Nodes: No enlarged mediastinal, hilar, or axillary lymph nodes. Thyroid  gland, trachea, and esophagus demonstrate no significant findings. Lungs/Pleura: No acute airspace disease, effusion, or pneumothorax. Central airways are patent. Upper Abdomen: No acute abnormality. Musculoskeletal: No acute or destructive bony abnormalities. Reconstructed images demonstrate no additional findings. Review of the MIP images confirms the above findings. IMPRESSION: 1. No evidence of pulmonary embolus. 2. No acute intrathoracic process. 3. Stable left atrial dilatation. 4.  Aortic Atherosclerosis (ICD10-I70.0). Electronically Signed   By: Bobbye Burrow M.D.   On: 11/08/2023 18:10   CT ABDOMEN PELVIS W CONTRAST Result Date: 11/08/2023 CLINICAL DATA:  Abdomen pain EXAM: CT ABDOMEN AND PELVIS WITH CONTRAST TECHNIQUE: Multidetector CT imaging of the abdomen and pelvis was performed using the standard protocol following bolus administration of intravenous contrast. RADIATION DOSE REDUCTION: This exam was performed according to the departmental dose-optimization program which includes automated exposure control, adjustment of the mA and/or kV according to patient size and/or use of iterative reconstruction technique. CONTRAST:  65mL OMNIPAQUE  IOHEXOL  350 MG/ML SOLN COMPARISON:  CT 03/16/2023 FINDINGS: Lower chest: Lung bases demonstrate no acute airspace disease. Mild cardiomegaly. Aortic valve prosthesis. Hepatobiliary: Stable hepatic cysts. No calcified gallstone or biliary dilatation. Pancreas: Unremarkable. No pancreatic ductal dilatation or surrounding inflammatory changes. Spleen:  Normal in size without focal abnormality. Adrenals/Urinary Tract: Adrenal glands are normal. Small nonobstructing kidney stones. Enlarged bilateral extrarenal pelvises without hydroureter or obstructing stone. The bladder is unremarkable Stomach/Bowel: Stomach is within normal limits. Appendix appears normal. No evidence of bowel wall thickening, distention, or inflammatory changes. Diverticular disease of the left colon without acute inflammation. Vascular/Lymphatic: Aortic atherosclerosis. No enlarged abdominal or pelvic lymph nodes. Reproductive: Prostatectomy Other: Negative for pelvic effusion or free air Musculoskeletal: Multilevel degenerative changes. No acute osseous  abnormality IMPRESSION: 1. No CT evidence for acute intra-abdominal or pelvic abnormality. 2. Diverticular disease of the left colon without acute inflammation. 3. Nonobstructing kidney stones. 4. Aortic atherosclerosis.  Cardiomegaly Aortic Atherosclerosis (ICD10-I70.0). Electronically Signed   By: Esmeralda Hedge M.D.   On: 11/08/2023 18:08   DG Chest Portable 1 View Result Date: 11/08/2023 CLINICAL DATA:  Shortness of breath and 2 days of chest tightness EXAM: PORTABLE CHEST 1 VIEW COMPARISON:  Chest radiograph dated 11/01/2023 FINDINGS: Normal lung volumes. No focal consolidations. No pleural effusion or pneumothorax. Similar postsurgical cardiomediastinal silhouette. Median sternotomy wires are nondisplaced. IMPRESSION: No acute disease. Electronically Signed   By: Limin  Xu M.D.   On: 11/08/2023 16:39     .Critical Care  Performed by: Dickson Founds, PA-C Authorized by: Dickson Founds, PA-C   Critical care provider statement:    Critical care time (minutes):  35   Critical care was necessary to treat or prevent imminent or life-threatening deterioration of the following conditions:  Cardiac failure (afib with RVR)   Critical care was time spent personally by me on the following activities:  Development of treatment  plan with patient or surrogate, discussions with consultants, evaluation of patient's response to treatment, examination of patient, ordering and review of laboratory studies, ordering and review of radiographic studies, ordering and performing treatments and interventions, pulse oximetry, re-evaluation of patient's condition and review of old charts    Medications Ordered in the ED  diltiazem  (CARDIZEM ) 1 mg/mL load via infusion 10 mg (10 mg Intravenous Bolus from Bag 11/08/23 1757)    And  diltiazem  (CARDIZEM ) 125 mg in dextrose  5% 125 mL (1 mg/mL) infusion (5 mg/hr Intravenous New Bag/Given 11/08/23 1757)  rosuvastatin  (CRESTOR ) tablet 40 mg (has no administration in time range)  levothyroxine  (SYNTHROID ) tablet 50 mcg (has no administration in time range)  clopidogrel  (PLAVIX ) tablet 75 mg (has no administration in time range)  clonazePAM  (KLONOPIN ) tablet 0.5 mg (has no administration in time range)  enoxaparin  (LOVENOX ) injection 40 mg (has no administration in time range)  sodium chloride  flush (NS) 0.9 % injection 3 mL (has no administration in time range)  acetaminophen  (TYLENOL ) tablet 650 mg (has no administration in time range)    Or  acetaminophen  (TYLENOL ) suppository 650 mg (has no administration in time range)  polyethylene glycol (MIRALAX  / GLYCOLAX ) packet 17 g (has no administration in time range)  ondansetron  (ZOFRAN ) tablet 4 mg (has no administration in time range)    Or  ondansetron  (ZOFRAN ) injection 4 mg (has no administration in time range)  oxyCODONE  (Oxy IR/ROXICODONE ) immediate release tablet 5-10 mg (has no administration in time range)  sodium chloride  0.9 % bolus 500 mL (0 mLs Intravenous Stopped 11/08/23 1729)  iohexol  (OMNIPAQUE ) 350 MG/ML injection 65 mL (65 mLs Intravenous Contrast Given 11/08/23 1758)   Patient presents to the ED for concern of SOB, this involves an extensive number of treatment options, and is a complaint that carries with it a high risk of  complications and morbidity.  History of CABG, NSTEMI, CAD, A-fib not anticoagulated.  Left upper abdominal pain over the last few weeks, no change in stools.  No blood in stool.  No nausea or vomiting, eating and drink without difficulty.  No falls or injury.  Some dyspnea on exertion over the last few weeks worsening shortness of breath and feeling off in his chest last 3 to 4 days.  On arrival here he is afebrile however he is hypotensive, tachycardic.  He does not appear grossly fluid overloaded.  He has no risk factors for pulmonary embolism.  He is neurovascularly intact.  Will plan labs, imaging and reassess.    Labs and imaging personally viewed interpreted BNP 277.9 down from last week CBC without leukocytosis UA negative for infection Lactic 1.9 Troponin 4 CBC without leukocytosis Metabolic panel glucose 126 CT a chest without significant abnormality CT abdomen pelvis diverticulosis without diverticulitis  Patient reassessed.  Discussed labs and imaging with patient, family in room.  He has been started on a Cardizem  drip for his persistent A-fib with RVR.  Rate has improved however still in A-fib.  Blood pressures have normalized, no longer having hypotension.  Discussed with cardiology recommends medicine admit, cards will follow and see tomorrow  Discussed with Dr. Brice Campi with medicine team who is agreeable to evaluate patient for admission.  The patient appears reasonably stabilized for admission considering the current resources, flow, and capabilities available in the ED at this time, and I doubt any other [facility] requiring further screening and/or treatment in the ED prior to admission.   Clinical Course as of 11/08/23 2118  Tue Nov 08, 2023  2033 Dr. Lenon Radar with Cards, rec medicine admit, Cards will follow and see tomorrow [BH]  2046 A-fib, rate controlled into the 90s. [BH]    Clinical Course User Index [BH] Saysha Menta A, PA-C                                  Medical Decision Making Amount and/or Complexity of Data Reviewed Independent Historian: spouse External Data Reviewed: labs, radiology, ECG and notes. Labs: ordered. Decision-making details documented in ED Course. Radiology: ordered and independent interpretation performed. Decision-making details documented in ED Course. ECG/medicine tests: ordered and independent interpretation performed. Decision-making details documented in ED Course.  Risk OTC drugs. Prescription drug management. Parenteral controlled substances. Decision regarding hospitalization. Diagnosis or treatment significantly limited by social determinants of health.        Final diagnoses:  Atrial fibrillation with rapid ventricular response (HCC)  SOB (shortness of breath)    ED Discharge Orders          Ordered    Amb referral to AFIB Clinic        11/08/23 2113               Markail Diekman A, PA-C 11/08/23 2118

## 2023-11-08 NOTE — H&P (Addendum)
 History and Physical    Jeremy Waters ZOX:[id number] DOB: 25-Aug-1945 DOA: 11/08/2023  PCP: Colene Dauphin, MD   Patient coming from: Home   Chief Complaint: Chest tightness, SOB   HPI: Jeremy Waters is a 78 y.o. male with medical history significant for CAD status post CABG, hyperlipidemia, prostate cancer, history of AV replacement, and and atrial fibrillation status post MAZE procedure with left atrial appendage clip, no longer anticoagulated, and now presenting with chest tightness and shortness of breath.  Patient reports 3 to 4 days of exertional dyspnea and a vague chest discomfort.  He is not usually able to tell when he is in atrial fibrillation but has a watch that sometimes alerts him that his heart rate is elevated.  He denies any cough, leg swelling, fever, or chills.  He had a Maze procedure and left atrial appendage clipping then March 2020 for, LAA closure was confirmed with cardiac CT in January 2025, and he stopped Eliquis  at that time.  He was previously on a beta-blocker which was discontinued due to hypotension and bradycardia.  He has a prescription for diltiazem  to use as-needed but has never used it.  ED Course: Upon arrival to the ED, patient is found to be afebrile and saturating well on room air with elevated heart rate and low blood pressure initially.  Labs are most notable for normal creatinine, normal TSH, normal free T4, normal WBC, normal troponin x 2, normal lactate, and BNP 278.  CTA chest is negative for PE or other acute intrathoracic abnormality.  Cardiology (Dr. Ossie Blend) was consulted by the ED PA, 500 mL of NS was administered, and the patient was started on IV diltiazem  infusion.  Review of Systems:  All other systems reviewed and apart from HPI, are negative.  Past Medical History:  Diagnosis Date   Adenomatous colon polyp 09/2007   Arthritis    OA / PAIN LEFT KNEE   Cancer (HCC)    prostate cancer   Cataract    removed both eyes    Coronary artery disease    COVID-19 virus infection    Diverticulosis of colon    w/o hemorrage   Heart murmur    TOLD HE HAS A SLIGHT MURMUR   Hyperlipidemia    Inguinal hernia    left side    Thyroid  disease     Past Surgical History:  Procedure Laterality Date   AORTIC VALVE REPLACEMENT N/A 08/18/2022   Procedure: AORTIC VALVE REPLACEMENT (AVR) USING EDWARDS INSPIRIS AORTIC VALVE SIZE ;  Surgeon: Eleanora Grew, MD;  Location[facility] OR;  Service: Open Heart Surgery;  Laterality: N/A;   BACK SURGERY  05/12/2005   L5   CARDIAC CATHETERIZATION     CLIPPING OF ATRIAL APPENDAGE N/A 08/18/2022   Procedure: CLIPPING OF ATRIAL APPENDAGE USING ATRICURE 45 ATRICLIP;  Surgeon: Eleanora Grew, MD;  Location: [facility] OR;  Service: Open Heart Surgery;  Laterality: N/A;  Median Sternotomy   COLONOSCOPY  1999   Negative; Dr Sandrea Cruel   COLONOSCOPY  2004   tics, hemorrhoids   COLONOSCOPY  2009   polyps (T.A.)   CORONARY ARTERY BYPASS GRAFT N/A 08/18/2022   Procedure: CORONARY ARTERY BYPASS GRAFTING (CABG) X2 USING LEFT INTERNAL MAMMARY ARTERY AND LEFT ENDOSCOPICALLY HARVESTED GREATER SAPHENOUS VEIN.;  Surgeon: Eleanora Grew, MD;  Location: [facility] OR;  Service: Open Heart Surgery;  Laterality: N/A;   HERNIA REPAIR  1987   HERNIA REPAIR  10/2003   KNEE SURGERY  1969  Patellar fracture fragment Lt   LAMINECTOMY  2000   L4-5   LEFT HEART CATH AND CORONARY ANGIOGRAPHY N/A 08/16/2022   Procedure: LEFT HEART CATH AND CORONARY ANGIOGRAPHY;  Surgeon: Kyra Phy, MD;  Location: [facility];  Service: Cardiovascular;  Laterality: N/A;   LUMBAR LAMINECTOMY/DECOMPRESSION MICRODISCECTOMY Right 10/08/2015   Procedure: MICRO LUMBAR DECOMPRESSION L4-L5 AND L5-S1 ON RIGHT ;  Surgeon: Orvan Blanch, MD;  Location: [facility] ORS;  Service: Orthopedics;  Laterality: Right;   MASS EXCISION  04/26/2011   Procedure: MINOR EXCISION OF MASS;  Surgeon: Keitha Pata, MD;  Location: [facility];  Service:  General;  Laterality: Right;  Excise subcutaneous mass right axilla Minor Room   MAZE N/A 08/18/2022   Procedure: MAZE USING ATRICURE Julianne Octave;  Surgeon: Eleanora Grew, MD;  Location: [facility] OR;  Service: Open Heart Surgery;  Laterality: N/A;   POLYPECTOMY     PROSTATECTOMY  04/16/2005   TEE WITHOUT CARDIOVERSION N/A 08/18/2022   Procedure: TRANSESOPHAGEAL ECHOCARDIOGRAM;  Surgeon: Eleanora Grew, MD;  Location: [facility] OR;  Service: Open Heart Surgery;  Laterality: N/A;   TONSILLECTOMY     TOTAL KNEE ARTHROPLASTY Left 07/03/2013   Procedure: LEFT TOTAL KNEE ARTHROPLASTY;  Surgeon: Bevin Bucks, MD;  Location: [facility] OR[facility];  Service: Orthopedics;  Laterality: Left;    Social History:   reports that he quit smoking about 42 years ago. His smoking use included cigarettes. He has never used smokeless tobacco. He reports current alcohol use of about 7.0 - 14.0 standard drinks of alcohol per week. He reports that he does not use drugs.  Allergies  Allergen Reactions   Atorvastatin Other (See Comments)    Increased LFTs    Family History  Problem Relation Age of Onset   Lung cancer Mother    COPD Mother    Cancer Father        Bladder Cancer   Prostate cancer Father    Colon cancer Father 94   Urolithiasis Father    Lung cancer Sister    Alcohol abuse Paternal Uncle    Stroke Paternal Uncle 89   Hypertension Paternal Uncle    Asthma Neg Hx    Heart disease Neg Hx    Esophageal cancer Neg Hx    Stomach cancer Neg Hx    Liver disease Neg Hx    Colon polyps Neg Hx    Rectal cancer Neg Hx      Prior to Admission medications   Medication Sig Start Date End Date Taking? Authorizing Provider  beta carotene  25000 UNIT capsule Take 25,000 Units by mouth daily.   Yes [provider]  clonazePAM  (KLONOPIN ) 0.5 MG tablet Take 1 tablet (0.5 mg total) by mouth at bedtime as needed for anxiety. 03/28/23  Yes Burns, Beckey Bourgeois, MD  clopidogrel  (PLAVIX ) 75 MG tablet Take 1 tablet (75 mg total)  by mouth daily. 08/17/23 08/16/24 Yes Bridgette Campus, MD  Evolocumab  (REPATHA  SURECLICK) 140 MG/ML SOAJ INJECT 140 MG INTO THE SKIN EVERY 14 DAYS 02/25/23  Yes Bridgette Campus, MD  fluticasone  (FLONASE ) 50 MCG/ACT nasal spray SPRAY 1 SPRAY IN EACH NOSTRIL ONCE DAILY 10/31/23  Yes Burns, Beckey Bourgeois, MD  furosemide  (LASIX ) 20 MG tablet Take 1 tablet (20 mg total) by mouth daily as needed. Patient taking differently: Take 20 mg by mouth daily as needed for fluid. 11/04/23  Yes Burns, Beckey Bourgeois, MD  levothyroxine  (SYNTHROID ) 50 MCG tablet Take 1 tablet (50  mcg total) by mouth daily. 06/21/23  Yes Burns, Beckey Bourgeois, MD  pyridOXINE  (VITAMIN B-6) 100 MG tablet Take 100 mg by mouth daily.   Yes [provider]  rosuvastatin  (CRESTOR ) 40 MG tablet TAKE 1 TABLET BY MOUTH DAILY Patient taking differently: Take 40 mg by mouth every evening. 09/02/23  Yes Burns, Beckey Bourgeois, MD  vitamin C  (ASCORBIC ACID ) 500 MG tablet Take 500 mg by mouth 2 (two) times daily.   Yes [provider]  vitamin E 400 UNIT capsule Take 400 Units by mouth daily.   Yes [provider]  diltiazem  (CARDIZEM ) 30 MG tablet Take 1 tablet (30 mg total) by mouth 4 (four) times daily as needed (heart rates < 120 bpm). 11/30/22 05/29/23  Bridgette Campus, MD    Physical Exam: Vitals:   11/08/23 1800 11/08/23 1815 11/08/23 1845 11/08/23 1915  BP: 101/73 101/69 130/81 113/84  Pulse: (!) 133 67 80 71  Resp: 18 16 16 14   Temp: 98.1 F (36.7 C)     TempSrc: Oral     SpO2: 98% 100% 97% 100%  Weight:      Height:        Constitutional: NAD, calm  Eyes: PERTLA, lids and conjunctivae normal ENMT: Mucous membranes are moist. Posterior pharynx clear of any exudate or lesions.   Neck: supple, no masses  Respiratory: no wheezing, no crackles. No accessory muscle use.  Cardiovascular: S1 & S2 heard, regular rate and rhythm. No extremity edema.   Abdomen: No tenderness, soft. Bowel sounds active.  Musculoskeletal: no clubbing / cyanosis. No  joint deformity upper and lower extremities.   Skin: no significant rashes, lesions, ulcers. Warm, dry, well-perfused. Neurologic: CN 2-12 grossly intact. Moving all extremities. Alert and oriented.  Psychiatric: Pleasant. Cooperative.    Labs and Imaging on Admission: I have personally reviewed following labs and imaging studies  CBC: Recent Labs  Lab 11/08/23 1555  WBC 8.3  NEUTROABS 5.1  HGB 14.2  HCT 41.6  MCV 97.9  PLT 235   Basic Metabolic Panel: Recent Labs  Lab 11/08/23 1555  NA 138  K 3.8  CL 103  CO2 23  GLUCOSE 126*  BUN 17  CREATININE 1.14  CALCIUM  9.5  MG 2.4   GFR: Estimated Creatinine Clearance: 51.5 mL/min (by C-G formula based on SCr of 1.14 mg/dL). Liver Function Tests: Recent Labs  Lab 11/08/23 1555  AST 31  ALT 18  ALKPHOS 55  BILITOT 0.9  PROT 7.0  ALBUMIN  3.6   Recent Labs  Lab 11/08/23 1555  LIPASE 34   No results for input(s): AMMONIA in the last 168 hours. Coagulation Profile: No results for input(s): INR, PROTIME in the last 168 hours. Cardiac Enzymes: No results for input(s): CKTOTAL, CKMB, CKMBINDEX, TROPONINI in the last 168 hours. BNP (last 3 results) No results for input(s): PROBNP in the last 8760 hours. HbA1C: No results for input(s): HGBA1C in the last 72 hours. CBG: No results for input(s): GLUCAP in the last 168 hours. Lipid Profile: No results for input(s): CHOL, HDL, LDLCALC, TRIG, CHOLHDL, LDLDIRECT in the last 72 hours. Thyroid  Function Tests: Recent Labs    11/08/23 1555  TSH 1.288  FREET4 0.95   Anemia Panel: No results for input(s): VITAMINB12, FOLATE, FERRITIN, TIBC, IRON, RETICCTPCT in the last 72 hours. Urine analysis:    Component Value Date/Time   COLORURINE YELLOW 11/08/2023 1519   APPEARANCEUR HAZY (A) 11/08/2023 1519   LABSPEC 1.016 11/08/2023 1519   PHURINE 5.0  11/08/2023 1519   GLUCOSEU NEGATIVE 11/08/2023 1519   GLUCOSEU NEGATIVE 08/24/2017  1630   HGBUR NEGATIVE 11/08/2023 1519   BILIRUBINUR NEGATIVE 11/08/2023 1519   BILIRUBINUR neg 02/22/2014 1514   KETONESUR NEGATIVE 11/08/2023 1519   PROTEINUR 30 (A) 11/08/2023 1519   UROBILINOGEN 0.2 08/24/2017 1630   NITRITE NEGATIVE 11/08/2023 1519   LEUKOCYTESUR NEGATIVE 11/08/2023 1519   Sepsis Labs: @LABRCNTIP (procalcitonin:4,lacticidven:4) ) Recent Results (from the past 240 hours)  Resp panel by RT-PCR (RSV, Flu A&B, Covid) Anterior Nasal Swab     Status: None   Collection Time: 11/08/23  3:57 PM   Specimen: Anterior Nasal Swab  Result Value Ref Range Status   SARS Coronavirus 2 by RT PCR NEGATIVE NEGATIVE Final   Influenza A by PCR NEGATIVE NEGATIVE Final   Influenza B by PCR NEGATIVE NEGATIVE Final    Comment: (NOTE) The Xpert Xpress SARS-CoV-2/FLU/RSV plus assay is intended as an aid in the diagnosis of influenza from Nasopharyngeal swab specimens and should not be used as a sole basis for treatment. Nasal washings and aspirates are unacceptable for Xpert Xpress SARS-CoV-2/FLU/RSV testing.  Fact Sheet for Patients: BloggerCourse.com  Fact Sheet for Healthcare Providers: SeriousBroker.it  This test is not yet approved or cleared by the [place]  FDA and has been authorized for detection and/or diagnosis of SARS-CoV-2 by FDA under an Emergency Use Authorization (EUA). This EUA will remain in effect (meaning this test can be used) for the duration of the COVID-19 declaration under Section 564(b)(1) of the Act, 21 U.S.C. section 360bbb-3(b)(1), unless the authorization is terminated or revoked.     Resp Syncytial Virus by PCR NEGATIVE NEGATIVE Final    Comment: (NOTE) Fact Sheet for Patients: BloggerCourse.com  Fact Sheet for Healthcare Providers: SeriousBroker.it  This test is not yet approved or cleared by the [place]  FDA and has been authorized for  detection and/or diagnosis of SARS-CoV-2 by FDA under an Emergency Use Authorization (EUA). This EUA will remain in effect (meaning this test can be used) for the duration of the COVID-19 declaration under Section 564(b)(1) of the Act, 21 U.S.C. section 360bbb-3(b)(1), unless the authorization is terminated or revoked.  Performed at [facility], [address] [place], [place] [address]      Radiological Exams on Admission: CT Angio Chest PE W and/or Wo Contrast Result Date: 11/08/2023 CLINICAL DATA:  Left upper abdominal pain, chest tightness for 2 days, short of breath, tachycardia EXAM: CT ANGIOGRAPHY CHEST WITH CONTRAST TECHNIQUE: Multidetector CT imaging of the chest was performed using the standard protocol during bolus administration of intravenous contrast. Multiplanar CT image reconstructions and MIPs were obtained to evaluate the vascular anatomy. RADIATION DOSE REDUCTION: This exam was performed according to the departmental dose-optimization program which includes automated exposure control, adjustment of the mA and/or kV according to patient size and/or use of iterative reconstruction technique. CONTRAST:  65mL OMNIPAQUE  IOHEXOL  350 MG/ML SOLN COMPARISON:  06/02/2023, 11/08/2023 FINDINGS: Cardiovascular: This is a technically adequate evaluation of the pulmonary vasculature. No filling defects or pulmonary emboli. No pericardial effusion. Stable left atrial dilatation. Occlusion device within the left atrial appendage. Aortic valve prosthesis identified. No evidence of thoracic aortic aneurysm or dissection. Atherosclerosis of the aorta and native coronary vasculature. Previous CABG. Mediastinum/Nodes: No enlarged mediastinal, hilar, or axillary lymph nodes. Thyroid  gland, trachea, and esophagus demonstrate no significant findings. Lungs/Pleura: No acute airspace disease, effusion, or pneumothorax. Central airways are patent. Upper Abdomen: No acute abnormality. Musculoskeletal: No  acute or destructive bony  abnormalities. Reconstructed images demonstrate no additional findings. Review of the MIP images confirms the above findings. IMPRESSION: 1. No evidence of pulmonary embolus. 2. No acute intrathoracic process. 3. Stable left atrial dilatation. 4.  Aortic Atherosclerosis (ICD10-I70.0). Electronically Signed   By: Bobbye Burrow M.D.   On: 11/08/2023 18:10   CT ABDOMEN PELVIS W CONTRAST Result Date: 11/08/2023 CLINICAL DATA:  Abdomen pain EXAM: CT ABDOMEN AND PELVIS WITH CONTRAST TECHNIQUE: Multidetector CT imaging of the abdomen and pelvis was performed using the standard protocol following bolus administration of intravenous contrast. RADIATION DOSE REDUCTION: This exam was performed according to the departmental dose-optimization program which includes automated exposure control, adjustment of the mA and/or kV according to patient size and/or use of iterative reconstruction technique. CONTRAST:  65mL OMNIPAQUE  IOHEXOL  350 MG/ML SOLN COMPARISON:  CT 03/16/2023 FINDINGS: Lower chest: Lung bases demonstrate no acute airspace disease. Mild cardiomegaly. Aortic valve prosthesis. Hepatobiliary: Stable hepatic cysts. No calcified gallstone or biliary dilatation. Pancreas: Unremarkable. No pancreatic ductal dilatation or surrounding inflammatory changes. Spleen: Normal in size without focal abnormality. Adrenals/Urinary Tract: Adrenal glands are normal. Small nonobstructing kidney stones. Enlarged bilateral extrarenal pelvises without hydroureter or obstructing stone. The bladder is unremarkable Stomach/Bowel: Stomach is within normal limits. Appendix appears normal. No evidence of bowel wall thickening, distention, or inflammatory changes. Diverticular disease of the left colon without acute inflammation. Vascular/Lymphatic: Aortic atherosclerosis. No enlarged abdominal or pelvic lymph nodes. Reproductive: Prostatectomy Other: Negative for pelvic effusion or free air Musculoskeletal:  Multilevel degenerative changes. No acute osseous abnormality IMPRESSION: 1. No CT evidence for acute intra-abdominal or pelvic abnormality. 2. Diverticular disease of the left colon without acute inflammation. 3. Nonobstructing kidney stones. 4. Aortic atherosclerosis.  Cardiomegaly Aortic Atherosclerosis (ICD10-I70.0). Electronically Signed   By: Esmeralda Hedge M.D.   On: 11/08/2023 18:08   DG Chest Portable 1 View Result Date: 11/08/2023 CLINICAL DATA:  Shortness of breath and 2 days of chest tightness EXAM: PORTABLE CHEST 1 VIEW COMPARISON:  Chest radiograph dated 11/01/2023 FINDINGS: Normal lung volumes. No focal consolidations. No pleural effusion or pneumothorax. Similar postsurgical cardiomediastinal silhouette. Median sternotomy wires are nondisplaced. IMPRESSION: No acute disease. Electronically Signed   By: Limin  Xu M.D.   On: 11/08/2023 16:39    EKG: Independently reviewed. Atrial flutter, rate 124, incomplete RBBB.   Assessment/Plan   1. Rapid atrial flutter  - Continue cardiac monitoring, continue IV diltiazem  for now, LAA closure confirmed by cardiac CT in January 2025, will defer anticoagulation decision to cardiology      Addendum: He converted back to SR with rate in upper 50s to 60s. Diltiazem  infusion was stopped.    2. CAD  - No anginal complaints   - Continue Crestor  and Plavix , no longer on beta-blocker d/t hypotension and bradycardia    3. Chronic HFpEF  - Appears compensated    4. Hypothyroidism  - Synthroid     DVT prophylaxis: Lovenox   Code Status: Full  Level of Care: Level of care: Progressive Family Communication: Wife at bedside   Disposition Plan:  Patient is from: Home  Anticipated d/c is to: TBD Anticipated d/c date is: 11/10/23  Patient currently: Pending rate-control  Consults called: Cardiology  Admission status: Inpatient     Walton Guppy, MD [facility]  11/08/2023, 9:13 PM

## 2023-11-08 NOTE — ED Notes (Signed)
 CCMD called.

## 2023-11-08 NOTE — Telephone Encounter (Signed)
 Copied from CRM [id number]. Topic: Referral - Question >> Nov 08, 2023  9:21 AM Danae Duncans wrote: Reason for CRM: Pt advise he is schedule to see cardiology 06/26, however he would like a sooner appt as he is leaving for a trip, pt would like to know is there any other recommendation for cardiologist with a sooner appt. Pt can be reached [phone]

## 2023-11-09 ENCOUNTER — Encounter (HOSPITAL_COMMUNITY): Payer: Self-pay | Admitting: Family Medicine

## 2023-11-09 ENCOUNTER — Inpatient Hospital Stay (HOSPITAL_COMMUNITY)
Admit: 2023-11-09 | Discharge: 2023-11-09 | Disposition: A | Discharge status: Home / Self Care | Attending: Cardiology | Admitting: Cardiology

## 2023-11-09 ENCOUNTER — Other Ambulatory Visit: Payer: Self-pay | Admitting: Cardiology

## 2023-11-09 DIAGNOSIS — I2510 Atherosclerotic heart disease of native coronary artery without angina pectoris: Secondary | ICD-10-CM | POA: Diagnosis not present

## 2023-11-09 DIAGNOSIS — I4892 Unspecified atrial flutter: Secondary | ICD-10-CM | POA: Diagnosis not present

## 2023-11-09 DIAGNOSIS — I480 Paroxysmal atrial fibrillation: Secondary | ICD-10-CM

## 2023-11-09 DIAGNOSIS — E039 Hypothyroidism, unspecified: Secondary | ICD-10-CM

## 2023-11-09 DIAGNOSIS — I5032 Chronic diastolic (congestive) heart failure: Secondary | ICD-10-CM | POA: Diagnosis not present

## 2023-11-09 LAB — CBC
HCT: 42.7 % (ref 39.0–52.0)
Hemoglobin: 14.4 g/dL (ref 13.0–17.0)
MCH: 33.4 pg (ref 26.0–34.0)
MCHC: 33.7 g/dL (ref 30.0–36.0)
MCV: 99.1 fL (ref 80.0–100.0)
Platelets: 198 10*3/uL (ref 150–400)
RBC: 4.31 MIL/uL (ref 4.22–5.81)
RDW: 13.8 % (ref 11.5–15.5)
WBC: 7.8 10*3/uL (ref 4.0–10.5)
nRBC: 0 % (ref 0.0–0.2)

## 2023-11-09 LAB — BASIC METABOLIC PANEL WITH GFR
Anion gap: 12 (ref 5–15)
BUN: 12 mg/dL (ref 8–23)
CO2: 21 mmol/L — ABNORMAL LOW (ref 22–32)
Calcium: 8.9 mg/dL (ref 8.9–10.3)
Chloride: 104 mmol/L (ref 98–111)
Creatinine, Ser: 1.01 mg/dL (ref 0.61–1.24)
GFR, Estimated: >60 mL/min (ref >60)
Glucose, Bld: 103 mg/dL — ABNORMAL HIGH (ref 70–99)
Potassium: 3.8 mmol/L (ref 3.5–5.1)
Sodium: 137 mmol/L (ref 135–145)

## 2023-11-09 MED ORDER — METOPROLOL SUCCINATE ER 25 MG PO TB24: 12.5000 mg | ORAL_TABLET | Freq: Every day | ORAL | 0 refills | Status: DC

## 2023-11-09 MED ORDER — POTASSIUM CHLORIDE CRYS ER 20 MEQ PO TBCR
40.0000 meq | EXTENDED_RELEASE_TABLET | Freq: Once | ORAL | Status: AC
  Administered 2023-11-09: 40 meq via ORAL
  Filled 2023-11-09: qty 2

## 2023-11-09 NOTE — Assessment & Plan Note (Signed)
 11-09-2023 stable. On prn lasix  for LE edema.

## 2023-11-09 NOTE — Hospital Course (Addendum)
 HPI: Jeremy Waters is a 78 y.o. male with medical history significant for CAD status post CABG, hyperlipidemia, prostate cancer, history of AV replacement, and and atrial fibrillation status post MAZE procedure with left atrial appendage clip, no longer anticoagulated, and now presenting with chest tightness and shortness of breath.   Patient reports 3 to 4 days of exertional dyspnea and a vague chest discomfort.  He is not usually able to tell when he is in atrial fibrillation but has a watch that sometimes alerts him that his heart rate is elevated.  He denies any cough, leg swelling, fever, or chills.   He had a Maze procedure and left atrial appendage clipping then March 2020 for, LAA closure was confirmed with cardiac CT in January 2025, and he stopped Eliquis  at that time.  He was previously on a beta-blocker which was discontinued due to hypotension and bradycardia.  He has a prescription for diltiazem  to use as-needed but has never used it.   ED Course: Upon arrival to the ED, patient is found to be afebrile and saturating well on room air with elevated heart rate and low blood pressure initially.  Labs are most notable for normal creatinine, normal TSH, normal free T4, normal WBC, normal troponin x 2, normal lactate, and BNP 278.  CTA chest is negative for PE or other acute intrathoracic abnormality.   Cardiology (Dr. Ossie Blend) was consulted by the ED PA, 500 mL of NS was administered, and the patient was started on IV diltiazem  infusion.    Significant Events: Admitted 11/08/2023 for recurrent atrial flutter   Admission Labs: WBC 8.3, HgB 14.2, plt 235 Mg 2.4 Na 138, K 3.8, CO2 of 23, BUN 17, Scr 1.14, glu 126 TSH 1.288, FT4 of 0.95 BNP 277 Covid/flu/rsv negative UA spg 1.016, negative nitrite, negative LE, WBC 0-5  Admission Imaging Studies: CXR No acute disease  CTPA No evidence of pulmonary embolus. 2. No acute intrathoracic process. 3. Stable left atrial dilatation. 4.   Aortic Atherosclerosis CT abd/pelvis No CT evidence for acute intra-abdominal or pelvic abnormality. 2. Diverticular disease of the left colon without acute inflammation. 3. Nonobstructing kidney stones. 4. Aortic atherosclerosis.  Significant Labs:   Significant Imaging Studies:   Antibiotic Therapy: Anti-infectives (From admission, onward)    None       Procedures:   Consultants: cardiology

## 2023-11-09 NOTE — Assessment & Plan Note (Signed)
 11-09-2023 s/p CABG x 2 vessels in 07-2022.

## 2023-11-09 NOTE — ED Notes (Signed)
Provider made aware of pt's HR.

## 2023-11-09 NOTE — Assessment & Plan Note (Addendum)
 11-09-2023 pt converted back to NSR shorted after IV cardizem  started. Pt improved much sooner than expected and is ready for discharge today..  Pt already has LAA occlusion device placed during his CABG in 2024. Verified with cardiology that pt does not need systemic anticoagulation. Will get pt set up for outpatient cardiac monitoring.  Depending on rhythm noted on outpatient monitoring will determine if pt needs EP ablation vs medication for rhythm control. Pt will be placed on Toprol -XL 12.5 mg at bedtime in hopes to maintain NSR.

## 2023-11-09 NOTE — ED Notes (Signed)
Zio placed on pt

## 2023-11-09 NOTE — Discharge Summary (Addendum)
 [facility] Physician Discharge Summary   Patient name: Jeremy Waters  Admit date:     11/08/2023  Discharge date: 11/09/2023  Attending Physician: Walton Guppy [[id number]]  Discharge Physician: Unk Garb   PCP: Colene Dauphin, MD  Admitted From: Home  Disposition:  Home  Recommendations for Outpatient Follow-up:  Follow up with PCP in 1-2 weeks Follow up with cardiology as scheduled Please follow up on the following pending results: results of [facility]  Home Health:No Equipment/Devices: [facility]  Discharge Condition:Stable CODE STATUS:FULL Diet recommendation: Heart Healthy Fluid Restriction: None  Hospital Summary: HPI: Jeremy Waters is a 78 y.o. male with medical history significant for CAD status post CABG, hyperlipidemia, prostate cancer, history of AV replacement, and and atrial fibrillation status post MAZE procedure with left atrial appendage clip, no longer anticoagulated, and now presenting with chest tightness and shortness of breath.   Patient reports 3 to 4 days of exertional dyspnea and a vague chest discomfort.  He is not usually able to tell when he is in atrial fibrillation but has a watch that sometimes alerts him that his heart rate is elevated.  He denies any cough, leg swelling, fever, or chills.   He had a Maze procedure and left atrial appendage clipping then March 2020 for, LAA closure was confirmed with cardiac CT in January 2025, and he stopped Eliquis  at that time.  He was previously on a beta-blocker which was discontinued due to hypotension and bradycardia.  He has a prescription for diltiazem  to use as-needed but has never used it.   ED Course: Upon arrival to the ED, patient is found to be afebrile and saturating well on room air with elevated heart rate and low blood pressure initially.  Labs are most notable for normal creatinine, normal TSH, normal free T4, normal WBC, normal troponin x 2, normal lactate, and BNP 278.  CTA chest is  negative for PE or other acute intrathoracic abnormality.   Cardiology (Dr. Ossie Blend) was consulted by the ED PA, 500 mL of NS was administered, and the patient was started on IV diltiazem  infusion.    Significant Events: Admitted 11/08/2023 for recurrent atrial flutter   Admission Labs: WBC 8.3, HgB 14.2, plt 235 Mg 2.4 Na 138, K 3.8, CO2 of 23, BUN 17, Scr 1.14, glu 126 TSH 1.288, FT4 of 0.95 BNP 277 Covid/flu/rsv negative UA spg 1.016, negative nitrite, negative LE, WBC 0-5  Admission Imaging Studies: CXR No acute disease  CTPA No evidence of pulmonary embolus. 2. No acute intrathoracic process. 3. Stable left atrial dilatation. 4.  Aortic Atherosclerosis CT abd/pelvis No CT evidence for acute intra-abdominal or pelvic abnormality. 2. Diverticular disease of the left colon without acute inflammation. 3. Nonobstructing kidney stones. 4. Aortic atherosclerosis.  Significant Labs:   Significant Imaging Studies:   Antibiotic Therapy: Anti-infectives (From admission, onward)    None       Procedures:   Consultants: cardiology   Hospital Course by Problem: * Atrial flutter with rapid ventricular response (HCC) 11-09-2023 pt converted back to NSR shorted after IV cardizem  started. Pt improved much sooner than expected and is ready for discharge today..  Pt already has LAA occlusion device placed during his CABG in 2024. Verified with cardiology that pt does not need systemic anticoagulation. Will get pt set up for outpatient cardiac monitoring.  Depending on rhythm noted on outpatient monitoring will determine if pt needs EP ablation vs medication for rhythm control. Pt will be placed on Toprol -XL 12.5  mg at bedtime in hopes to maintain NSR.  Chronic heart failure with preserved ejection fraction (HFpEF) (HCC) 11-09-2023 stable. On prn lasix  for LE edema.  CAD (coronary artery disease) 11-09-2023 s/p CABG x 2 vessels in 07-2022.  Acquired hypothyroidism 11-09-2023  stable. TSH and FT4 were normal. Continue synthroid  50 mcg daily.    Discharge Diagnoses:  Principal Problem:   Atrial flutter with rapid ventricular response (HCC) Active Problems:   Acquired hypothyroidism   CAD (coronary artery disease)   Chronic heart failure with preserved ejection fraction (HFpEF) [facility])   Discharge Instructions  Discharge Instructions     Amb referral to [facility] Clinic   Complete by: As directed    Call MD for:  difficulty breathing, headache or visual disturbances   Complete by: As directed    Call MD for:  extreme fatigue   Complete by: As directed    Call MD for:  hives   Complete by: As directed    Call MD for:  persistant dizziness or light-headedness   Complete by: As directed    Call MD for:  persistant nausea and vomiting   Complete by: As directed    Call MD for:  redness, tenderness, or signs of infection (pain, swelling, redness, odor or green/yellow discharge around incision site)   Complete by: As directed    Call MD for:  severe uncontrolled pain   Complete by: As directed    Call MD for:  temperature >100.4   Complete by: As directed    Diet - low sodium heart healthy   Complete by: As directed    Discharge instructions   Complete by: As directed    1. Follow up with your primary care provider in 1-2 weeks following discharge from hospital. 2. Keep cardiology appointment next week.   Increase activity slowly   Complete by: As directed       Allergies as of 11/09/2023       Reactions   Atorvastatin Other (See Comments)   Increased LFTs        Medication List     STOP taking these medications    diltiazem  30 MG tablet Commonly known as: Cardizem        TAKE these medications    ascorbic acid  500 MG tablet Commonly known as: VITAMIN C  Take 500 mg by mouth 2 (two) times daily.   beta carotene  25000 UNIT capsule Take 25,000 Units by mouth daily.   clonazePAM  0.5 MG tablet Commonly known as: KLONOPIN  Take 1 tablet  (0.5 mg total) by mouth at bedtime as needed for anxiety.   clopidogrel  75 MG tablet Commonly known as: Plavix  Take 1 tablet (75 mg total) by mouth daily.   fluticasone  50 MCG/ACT nasal spray Commonly known as: FLONASE  SPRAY 1 SPRAY IN EACH NOSTRIL ONCE DAILY   furosemide  20 MG tablet Commonly known as: LASIX  Take 1 tablet (20 mg total) by mouth daily as needed. What changed: reasons to take this   levothyroxine  50 MCG tablet Commonly known as: SYNTHROID  Take 1 tablet (50 mcg total) by mouth daily.   metoprolol  succinate 25 MG 24 hr tablet Commonly known as: Toprol  XL Take 0.5 tablets (12.5 mg total) by mouth at bedtime.   pyridOXINE  100 MG tablet Commonly known as: VITAMIN B6 Take 100 mg by mouth daily.   Repatha  SureClick 140 MG/ML Soaj Generic drug: Evolocumab  INJECT 140 MG INTO THE SKIN EVERY 14 DAYS   rosuvastatin  40 MG tablet Commonly known as: CRESTOR  TAKE 1  TABLET BY MOUTH DAILY What changed: when to take this   vitamin E 180 MG (400 UNITS) capsule Take 400 Units by mouth daily.        Allergies  Allergen Reactions   Atorvastatin Other (See Comments)    Increased LFTs    Discharge Exam: Vitals:   11/09/23 0915 11/09/23 1124  BP: 108/68   Pulse: 60   Resp: 17   Temp:  97.8 F (36.6 C)  SpO2: 96%     Physical Exam Vitals and nursing note reviewed.  Constitutional:      General: He is not in acute distress.    Appearance: He is normal weight. He is not toxic-appearing or diaphoretic.  HENT:     Head: Normocephalic and atraumatic.     Nose: Nose normal.   Eyes:     General: No scleral icterus.   Cardiovascular:     Rate and Rhythm: Normal rate and regular rhythm. Frequent Extrasystoles are present. Pulmonary:     Effort: Pulmonary effort is normal. No respiratory distress.     Breath sounds: Normal breath sounds. No wheezing or rales.  Abdominal:     General: Abdomen is flat. There is no distension.     Palpations: Abdomen is soft.      Tenderness: There is no abdominal tenderness.   Musculoskeletal:     Right lower leg: No edema.     Left lower leg: No edema.   Skin:    General: Skin is warm and dry.     Capillary Refill: Capillary refill takes less than 2 seconds.   Neurological:     General: No focal deficit present.     Mental Status: He is alert and oriented to person, place, and time.     The results of significant diagnostics from this hospitalization (including imaging, microbiology, ancillary and laboratory) are listed below for reference.    Microbiology: Recent Results (from the past 240 hours)  Resp panel by RT-PCR (RSV, Flu A&B, Covid) Anterior Nasal Swab     Status: None   Collection Time: 11/08/23  3:57 PM   Specimen: Anterior Nasal Swab  Result Value Ref Range Status   SARS Coronavirus 2 by RT PCR NEGATIVE NEGATIVE Final   Influenza A by PCR NEGATIVE NEGATIVE Final   Influenza B by PCR NEGATIVE NEGATIVE Final    Comment: (NOTE) The Xpert Xpress SARS-CoV-2/FLU/RSV plus assay is intended as an aid in the diagnosis of influenza from Nasopharyngeal swab specimens and should not be used as a sole basis for treatment. Nasal washings and aspirates are unacceptable for Xpert Xpress SARS-CoV-2/FLU/RSV testing.  Fact Sheet for Patients: BloggerCourse.com  Fact Sheet for Healthcare Providers: SeriousBroker.it  This test is not yet approved or cleared by the [place]  FDA and has been authorized for detection and/or diagnosis of SARS-CoV-2 by FDA under an Emergency Use Authorization (EUA). This EUA will remain in effect (meaning this test can be used) for the duration of the COVID-19 declaration under Section 564(b)(1) of the Act, 21 U.S.C. section 360bbb-3(b)(1), unless the authorization is terminated or revoked.     Resp Syncytial Virus by PCR NEGATIVE NEGATIVE Final    Comment: (NOTE) Fact Sheet for  Patients: BloggerCourse.com  Fact Sheet for Healthcare Providers: SeriousBroker.it  This test is not yet approved or cleared by the [place]  FDA and has been authorized for detection and/or diagnosis of SARS-CoV-2 by FDA under an Emergency Use Authorization (EUA). This EUA will remain in effect (meaning this test  can be used) for the duration of the COVID-19 declaration under Section 564(b)(1) of the Act, 21 U.S.C. section 360bbb-3(b)(1), unless the authorization is terminated or revoked.  Performed at [facility], [address], [place], [place] [address]      Labs: BNP (last 3 results) Recent Labs    11/02/23 1046 11/08/23 1555  BNP 182* 277.9*   Basic Metabolic Panel: Recent Labs  Lab 11/08/23 1555 11/09/23 0530  NA 138 137  K 3.8 3.8  CL 103 104  CO2 23 21*  GLUCOSE 126* 103*  BUN 17 12  CREATININE 1.14 1.01  CALCIUM  9.5 8.9  MG 2.4  --    Liver Function Tests: Recent Labs  Lab 11/08/23 1555  AST 31  ALT 18  ALKPHOS 55  BILITOT 0.9  PROT 7.0  ALBUMIN  3.6   Recent Labs  Lab 11/08/23 1555  LIPASE 34   No results for input(s): AMMONIA in the last 168 hours. CBC: Recent Labs  Lab 11/08/23 1555 11/09/23 0530  WBC 8.3 7.8  NEUTROABS 5.1  --   HGB 14.2 14.4  HCT 41.6 42.7  MCV 97.9 99.1  PLT 235 198   BNP: Recent Labs  Lab 11/08/23 1555  BNP 277.9*   Thyroid  function studies Recent Labs    11/08/23 1555  TSH 1.288  FREET4 0.95   Urinalysis    Component Value Date/Time   COLORURINE YELLOW 11/08/2023 1519   APPEARANCEUR HAZY (A) 11/08/2023 1519   LABSPEC 1.016 11/08/2023 1519   PHURINE 5.0 11/08/2023 1519   GLUCOSEU NEGATIVE 11/08/2023 1519   GLUCOSEU NEGATIVE 08/24/2017 1630   HGBUR NEGATIVE 11/08/2023 1519   BILIRUBINUR NEGATIVE 11/08/2023 1519   BILIRUBINUR neg 02/22/2014 1514   KETONESUR NEGATIVE 11/08/2023 1519   PROTEINUR 30 (A) 11/08/2023 1519    UROBILINOGEN 0.2 08/24/2017 1630   NITRITE NEGATIVE 11/08/2023 1519   LEUKOCYTESUR NEGATIVE 11/08/2023 1519   Sepsis Labs Recent Labs  Lab 11/08/23 1555 11/09/23 0530  WBC 8.3 7.8    Discharge EKG: on my interpretation shows NSR    Procedures/Studies: CT Angio Chest PE W and/or Wo Contrast Result Date: 11/08/2023 CLINICAL DATA:  Left upper abdominal pain, chest tightness for 2 days, short of breath, tachycardia EXAM: CT ANGIOGRAPHY CHEST WITH CONTRAST TECHNIQUE: Multidetector CT imaging of the chest was performed using the standard protocol during bolus administration of intravenous contrast. Multiplanar CT image reconstructions and MIPs were obtained to evaluate the vascular anatomy. RADIATION DOSE REDUCTION: This exam was performed according to the departmental dose-optimization program which includes automated exposure control, adjustment of the mA and/or kV according to patient size and/or use of iterative reconstruction technique. CONTRAST:  65mL OMNIPAQUE  IOHEXOL  350 MG/ML SOLN COMPARISON:  06/02/2023, 11/08/2023 FINDINGS: Cardiovascular: This is a technically adequate evaluation of the pulmonary vasculature. No filling defects or pulmonary emboli. No pericardial effusion. Stable left atrial dilatation. Occlusion device within the left atrial appendage. Aortic valve prosthesis identified. No evidence of thoracic aortic aneurysm or dissection. Atherosclerosis of the aorta and native coronary vasculature. Previous CABG. Mediastinum/Nodes: No enlarged mediastinal, hilar, or axillary lymph nodes. Thyroid  gland, trachea, and esophagus demonstrate no significant findings. Lungs/Pleura: No acute airspace disease, effusion, or pneumothorax. Central airways are patent. Upper Abdomen: No acute abnormality. Musculoskeletal: No acute or destructive bony abnormalities. Reconstructed images demonstrate no additional findings. Review of the MIP images confirms the above findings. IMPRESSION: 1. No evidence  of pulmonary embolus. 2. No acute intrathoracic process. 3. Stable left atrial dilatation. 4.  Aortic Atherosclerosis (ICD10-I70.0). Electronically Signed   By: Bobbye Burrow M.D.   On: 11/08/2023 18:10   CT ABDOMEN PELVIS W CONTRAST Result Date: 11/08/2023 CLINICAL DATA:  Abdomen pain EXAM: CT ABDOMEN AND PELVIS WITH CONTRAST TECHNIQUE: Multidetector CT imaging of the abdomen and pelvis was performed using the standard protocol following bolus administration of intravenous contrast. RADIATION DOSE REDUCTION: This exam was performed according to the departmental dose-optimization program which includes automated exposure control, adjustment of the mA and/or kV according to patient size and/or use of iterative reconstruction technique. CONTRAST:  65mL OMNIPAQUE  IOHEXOL  350 MG/ML SOLN COMPARISON:  CT 03/16/2023 FINDINGS: Lower chest: Lung bases demonstrate no acute airspace disease. Mild cardiomegaly. Aortic valve prosthesis. Hepatobiliary: Stable hepatic cysts. No calcified gallstone or biliary dilatation. Pancreas: Unremarkable. No pancreatic ductal dilatation or surrounding inflammatory changes. Spleen: Normal in size without focal abnormality. Adrenals/Urinary Tract: Adrenal glands are normal. Small nonobstructing kidney stones. Enlarged bilateral extrarenal pelvises without hydroureter or obstructing stone. The bladder is unremarkable Stomach/Bowel: Stomach is within normal limits. Appendix appears normal. No evidence of bowel wall thickening, distention, or inflammatory changes. Diverticular disease of the left colon without acute inflammation. Vascular/Lymphatic: Aortic atherosclerosis. No enlarged abdominal or pelvic lymph nodes. Reproductive: Prostatectomy Other: Negative for pelvic effusion or free air Musculoskeletal: Multilevel degenerative changes. No acute osseous abnormality IMPRESSION: 1. No CT evidence for acute intra-abdominal or pelvic abnormality. 2. Diverticular disease of the left colon  without acute inflammation. 3. Nonobstructing kidney stones. 4. Aortic atherosclerosis.  Cardiomegaly Aortic Atherosclerosis (ICD10-I70.0). Electronically Signed   By: Esmeralda Hedge M.D.   On: 11/08/2023 18:08   DG Chest Portable 1 View Result Date: 11/08/2023 CLINICAL DATA:  Shortness of breath and 2 days of chest tightness EXAM: PORTABLE CHEST 1 VIEW COMPARISON:  Chest radiograph dated 11/01/2023 FINDINGS: Normal lung volumes. No focal consolidations. No pleural effusion or pneumothorax. Similar postsurgical cardiomediastinal silhouette. Median sternotomy wires are nondisplaced. IMPRESSION: No acute disease. Electronically Signed   By: Limin  Xu M.D.   On: 11/08/2023 16:39   DG Abd 1 View Result Date: 11/05/2023 CLINICAL DATA:  LUQ pressure, mild LLQ discomfort, ? constipation EXAM: ABDOMEN - 1 VIEW COMPARISON:  August 25, 2017 FINDINGS: There is a single prominent loop of small bowel in the LEFT lower quadrant. Otherwise there is mild to moderate colonic stool burden diffusely throughout the colon. Pelvic phleboliths. Atherosclerotic calcifications. Degenerative changes of the lumbar spine. Multiple bilateral nonobstructing nephrolithiasis. IMPRESSION: 1. There is a single prominent loop of small bowel in the LEFT lower quadrant. This is nonspecific. 2. Multiple bilateral nephrolithiasis. If there is clinical concern for obstruction or focal inflammation, recommend dedicated CT abdomen pelvis with contrast. Electronically Signed   By: Clancy Crimes M.D.   On: 11/05/2023 16:48   DG Chest 2 View Result Date: 11/01/2023 CLINICAL DATA:  Abdominal pain EXAM: CHEST - 2 VIEW COMPARISON:  None Available. FINDINGS: The heart size and mediastinal contours are within normal limits. Both lungs are clear. The visualized skeletal structures are unremarkable. Prior AVR and left atrial clipping prior CABG IMPRESSION: No active cardiopulmonary disease. Electronically Signed   By: Fredrich Jefferson M.D.   On: 11/01/2023  09:51    Time coordinating discharge: 60 mins  SIGNED:  Unk Garb, DO [facility] 11/09/23, 12:54 PM

## 2023-11-09 NOTE — Progress Notes (Signed)
 PROGRESS NOTE    YAMATO KOPF  ZOX:[id number] DOB: 10/19/1945 DOA: 11/08/2023 PCP: Colene Dauphin, MD  Subjective: Pt seen and examined. 3 weeks of intermittent fatigue. Normally exercises every day with 40 mins on elliptical trainer. Only recently able to tolerate 30 mins. Falling asleep while sitting in recliner which wife states is very unusual for him.   Hospital Course: HPI: LUCIEN BUDNEY is a 78 y.o. male with medical history significant for CAD status post CABG, hyperlipidemia, prostate cancer, history of AV replacement, and and atrial fibrillation status post MAZE procedure with left atrial appendage clip, no longer anticoagulated, and now presenting with chest tightness and shortness of breath.   Patient reports 3 to 4 days of exertional dyspnea and a vague chest discomfort.  He is not usually able to tell when he is in atrial fibrillation but has a watch that sometimes alerts him that his heart rate is elevated.  He denies any cough, leg swelling, fever, or chills.   He had a Maze procedure and left atrial appendage clipping then March 2020 for, LAA closure was confirmed with cardiac CT in January 2025, and he stopped Eliquis  at that time.  He was previously on a beta-blocker which was discontinued due to hypotension and bradycardia.  He has a prescription for diltiazem  to use as-needed but has never used it.   ED Course: Upon arrival to the ED, patient is found to be afebrile and saturating well on room air with elevated heart rate and low blood pressure initially.  Labs are most notable for normal creatinine, normal TSH, normal free T4, normal WBC, normal troponin x 2, normal lactate, and BNP 278.  CTA chest is negative for PE or other acute intrathoracic abnormality.   Cardiology (Dr. Ossie Blend) was consulted by the ED PA, 500 mL of NS was administered, and the patient was started on IV diltiazem  infusion.    Significant Events: Admitted 11/08/2023 for recurrent atrial  flutter   Admission Labs: WBC 8.3, HgB 14.2, plt 235 Mg 2.4 Na 138, K 3.8, CO2 of 23, BUN 17, Scr 1.14, glu 126 TSH 1.288, FT4 of 0.95 BNP 277 Covid/flu/rsv negative UA spg 1.016, negative nitrite, negative LE, WBC 0-5  Admission Imaging Studies: CXR No acute disease  CTPA No evidence of pulmonary embolus. 2. No acute intrathoracic process. 3. Stable left atrial dilatation. 4.  Aortic Atherosclerosis CT abd/pelvis No CT evidence for acute intra-abdominal or pelvic abnormality. 2. Diverticular disease of the left colon without acute inflammation. 3. Nonobstructing kidney stones. 4. Aortic atherosclerosis.  Significant Labs:   Significant Imaging Studies:   Antibiotic Therapy: Anti-infectives (From admission, onward)    None       Procedures:   Consultants: cardiology    Assessment and Plan: * Atrial flutter with rapid ventricular response (HCC) 11-09-2023 pt converted back to NSR shorted after IV cardizem  started. Pt already has LAA occlusion device placed during his CABG in 2024. Verified with cardiology that pt does not need systemic anticoagulation. Will get pt set up for outpatient cardiac monitoring.  Depending on rhythm noted on outpatient monitoring will determine if pt needs EP ablation vs medication for rhythm control. Pt will be placed on Toprol -XL 12.5 mg at bedtime in hopes to maintain NSR.  Chronic heart failure with preserved ejection fraction (HFpEF) (HCC) 11-09-2023 stable. On prn lasix  for LE edema.  CAD (coronary artery disease) 11-09-2023 s/p CABG x 2 vessels in 07-2022.  Acquired hypothyroidism 11-09-2023 stable. TSH and FT4 were normal.  Continue synthroid  50 mcg daily.   DVT prophylaxis: enoxaparin  (LOVENOX ) injection 40 mg Start: 11/08/23 2200    Code Status: Full Code Family Communication: discussed with pt and wife mary at bedside Disposition Plan: return home Reason for continuing need for hospitalization: stable for  DC.  Objective: Vitals:   11/09/23 0732 11/09/23 0734 11/09/23 0800 11/09/23 0915  BP: 132/74  129/67 108/68  Pulse: (!) 58  (!) 56 60  Resp: 13  13 17   Temp:  97.8 F (36.6 C)    TempSrc:  Oral    SpO2: 98%  97% 96%  Weight:      Height:       No intake or output data in the 24 hours ending 11/09/23 1121 Filed Weights   11/08/23 1535  Weight: 69.4 kg    Examination:  Physical Exam Vitals and nursing note reviewed.  Constitutional:      General: He is not in acute distress.    Appearance: He is normal weight. He is not toxic-appearing or diaphoretic.  HENT:     Head: Normocephalic and atraumatic.     Nose: Nose normal.   Eyes:     General: No scleral icterus.   Cardiovascular:     Rate and Rhythm: Normal rate and regular rhythm. Frequent Extrasystoles are present. Pulmonary:     Effort: Pulmonary effort is normal. No respiratory distress.     Breath sounds: Normal breath sounds. No wheezing or rales.  Abdominal:     General: Abdomen is flat. There is no distension.     Palpations: Abdomen is soft.     Tenderness: There is no abdominal tenderness.   Musculoskeletal:     Right lower leg: No edema.     Left lower leg: No edema.   Skin:    General: Skin is warm and dry.     Capillary Refill: Capillary refill takes less than 2 seconds.   Neurological:     General: No focal deficit present.     Mental Status: He is alert and oriented to person, place, and time.     Data Reviewed: I have personally reviewed following labs and imaging studies  CBC: Recent Labs  Lab 11/08/23 1555 11/09/23 0530  WBC 8.3 7.8  NEUTROABS 5.1  --   HGB 14.2 14.4  HCT 41.6 42.7  MCV 97.9 99.1  PLT 235 198   Basic Metabolic Panel: Recent Labs  Lab 11/08/23 1555 11/09/23 0530  NA 138 137  K 3.8 3.8  CL 103 104  CO2 23 21*  GLUCOSE 126* 103*  BUN 17 12  CREATININE 1.14 1.01  CALCIUM  9.5 8.9  MG 2.4  --    GFR: Estimated Creatinine Clearance: 58.2 mL/min (by C-G  formula based on SCr of 1.01 mg/dL). Liver Function Tests: Recent Labs  Lab 11/08/23 1555  AST 31  ALT 18  ALKPHOS 55  BILITOT 0.9  PROT 7.0  ALBUMIN  3.6   Recent Labs  Lab 11/08/23 1555  LIPASE 34   BNP (last 3 results) Recent Labs    11/02/23 1046 11/08/23 1555  BNP 182* 277.9*   Thyroid  Function Tests: Recent Labs    11/08/23 1555  TSH 1.288  FREET4 0.95   Sepsis Labs: Recent Labs  Lab 11/08/23 1623 11/08/23 1745  LATICACIDVEN 1.4 1.9    Recent Results (from the past 240 hours)  Resp panel by RT-PCR (RSV, Flu A&B, Covid) Anterior Nasal Swab     Status: None   Collection Time:  11/08/23  3:57 PM   Specimen: Anterior Nasal Swab  Result Value Ref Range Status   SARS Coronavirus 2 by RT PCR NEGATIVE NEGATIVE Final   Influenza A by PCR NEGATIVE NEGATIVE Final   Influenza B by PCR NEGATIVE NEGATIVE Final    Comment: (NOTE) The Xpert Xpress SARS-CoV-2/FLU/RSV plus assay is intended as an aid in the diagnosis of influenza from Nasopharyngeal swab specimens and should not be used as a sole basis for treatment. Nasal washings and aspirates are unacceptable for Xpert Xpress SARS-CoV-2/FLU/RSV testing.  Fact Sheet for Patients: BloggerCourse.com  Fact Sheet for Healthcare Providers: SeriousBroker.it  This test is not yet approved or cleared by the [place]  FDA and has been authorized for detection and/or diagnosis of SARS-CoV-2 by FDA under an Emergency Use Authorization (EUA). This EUA will remain in effect (meaning this test can be used) for the duration of the COVID-19 declaration under Section 564(b)(1) of the Act, 21 U.S.C. section 360bbb-3(b)(1), unless the authorization is terminated or revoked.     Resp Syncytial Virus by PCR NEGATIVE NEGATIVE Final    Comment: (NOTE) Fact Sheet for Patients: BloggerCourse.com  Fact Sheet for Healthcare  Providers: SeriousBroker.it  This test is not yet approved or cleared by the [place]  FDA and has been authorized for detection and/or diagnosis of SARS-CoV-2 by FDA under an Emergency Use Authorization (EUA). This EUA will remain in effect (meaning this test can be used) for the duration of the COVID-19 declaration under Section 564(b)(1) of the Act, 21 U.S.C. section 360bbb-3(b)(1), unless the authorization is terminated or revoked.  Performed at [facility], [address], [place], [place] [address]      Radiology Studies: CT Angio Chest PE W and/or Wo Contrast Result Date: 11/08/2023 CLINICAL DATA:  Left upper abdominal pain, chest tightness for 2 days, short of breath, tachycardia EXAM: CT ANGIOGRAPHY CHEST WITH CONTRAST TECHNIQUE: Multidetector CT imaging of the chest was performed using the standard protocol during bolus administration of intravenous contrast. Multiplanar CT image reconstructions and MIPs were obtained to evaluate the vascular anatomy. RADIATION DOSE REDUCTION: This exam was performed according to the departmental dose-optimization program which includes automated exposure control, adjustment of the mA and/or kV according to patient size and/or use of iterative reconstruction technique. CONTRAST:  65mL OMNIPAQUE  IOHEXOL  350 MG/ML SOLN COMPARISON:  06/02/2023, 11/08/2023 FINDINGS: Cardiovascular: This is a technically adequate evaluation of the pulmonary vasculature. No filling defects or pulmonary emboli. No pericardial effusion. Stable left atrial dilatation. Occlusion device within the left atrial appendage. Aortic valve prosthesis identified. No evidence of thoracic aortic aneurysm or dissection. Atherosclerosis of the aorta and native coronary vasculature. Previous CABG. Mediastinum/Nodes: No enlarged mediastinal, hilar, or axillary lymph nodes. Thyroid  gland, trachea, and esophagus demonstrate no significant findings. Lungs/Pleura:  No acute airspace disease, effusion, or pneumothorax. Central airways are patent. Upper Abdomen: No acute abnormality. Musculoskeletal: No acute or destructive bony abnormalities. Reconstructed images demonstrate no additional findings. Review of the MIP images confirms the above findings. IMPRESSION: 1. No evidence of pulmonary embolus. 2. No acute intrathoracic process. 3. Stable left atrial dilatation. 4.  Aortic Atherosclerosis (ICD10-I70.0). Electronically Signed   By: Bobbye Burrow M.D.   On: 11/08/2023 18:10   CT ABDOMEN PELVIS W CONTRAST Result Date: 11/08/2023 CLINICAL DATA:  Abdomen pain EXAM: CT ABDOMEN AND PELVIS WITH CONTRAST TECHNIQUE: Multidetector CT imaging of the abdomen and pelvis was performed using the standard protocol following bolus administration of intravenous contrast. RADIATION DOSE REDUCTION: This exam was  performed according to the departmental dose-optimization program which includes automated exposure control, adjustment of the mA and/or kV according to patient size and/or use of iterative reconstruction technique. CONTRAST:  65mL OMNIPAQUE  IOHEXOL  350 MG/ML SOLN COMPARISON:  CT 03/16/2023 FINDINGS: Lower chest: Lung bases demonstrate no acute airspace disease. Mild cardiomegaly. Aortic valve prosthesis. Hepatobiliary: Stable hepatic cysts. No calcified gallstone or biliary dilatation. Pancreas: Unremarkable. No pancreatic ductal dilatation or surrounding inflammatory changes. Spleen: Normal in size without focal abnormality. Adrenals/Urinary Tract: Adrenal glands are normal. Small nonobstructing kidney stones. Enlarged bilateral extrarenal pelvises without hydroureter or obstructing stone. The bladder is unremarkable Stomach/Bowel: Stomach is within normal limits. Appendix appears normal. No evidence of bowel wall thickening, distention, or inflammatory changes. Diverticular disease of the left colon without acute inflammation. Vascular/Lymphatic: Aortic atherosclerosis. No  enlarged abdominal or pelvic lymph nodes. Reproductive: Prostatectomy Other: Negative for pelvic effusion or free air Musculoskeletal: Multilevel degenerative changes. No acute osseous abnormality IMPRESSION: 1. No CT evidence for acute intra-abdominal or pelvic abnormality. 2. Diverticular disease of the left colon without acute inflammation. 3. Nonobstructing kidney stones. 4. Aortic atherosclerosis.  Cardiomegaly Aortic Atherosclerosis (ICD10-I70.0). Electronically Signed   By: Esmeralda Hedge M.D.   On: 11/08/2023 18:08   DG Chest Portable 1 View Result Date: 11/08/2023 CLINICAL DATA:  Shortness of breath and 2 days of chest tightness EXAM: PORTABLE CHEST 1 VIEW COMPARISON:  Chest radiograph dated 11/01/2023 FINDINGS: Normal lung volumes. No focal consolidations. No pleural effusion or pneumothorax. Similar postsurgical cardiomediastinal silhouette. Median sternotomy wires are nondisplaced. IMPRESSION: No acute disease. Electronically Signed   By: Limin  Xu M.D.   On: 11/08/2023 16:39    Scheduled Meds:  clopidogrel   75 mg Oral Daily   enoxaparin  (LOVENOX ) injection  40 mg Subcutaneous Q24H   levothyroxine   50 mcg Oral Q0600   rosuvastatin   40 mg Oral QPM   sodium chloride  flush  3 mL Intravenous Q12H   Continuous Infusions:   LOS: 1 day   Time spent: 55 minutes  Unk Garb, DO  [facility]  11/09/2023, 11:21 AM

## 2023-11-09 NOTE — Addendum Note (Signed)
 Encounter addended by: Nathanial Balboa on: 11/09/2023 11:17 AM  Actions taken: Imaging Exam begun

## 2023-11-09 NOTE — Subjective & Objective (Signed)
 Pt seen and examined. 3 weeks of intermittent fatigue. Normally exercises every day with 40 mins on elliptical trainer. Only recently able to tolerate 30 mins. Falling asleep while sitting in recliner which wife states is very unusual for him.

## 2023-11-09 NOTE — Consult Note (Signed)
 Cardiology Consultation   Patient ID: Jeremy Waters MRN: [id number]; DOB: March 12, 1946  Admit date: 11/08/2023 Date of Consult: 11/09/2023  PCP:  Jeremy Dauphin, MD   [facility] Providers Cardiologist:  Jeremy Campus, MD (Inactive)        Patient Profile: Jeremy Waters is a 78 y.o. male with a hx of coronary artery disease status post CABG, hyperlipidemia, prostate cancer, aortic valve replacement, Maze procedure with left atrial appendage ligation with atricure no longer on anticoagulation who is being seen 11/09/2023 for the evaluation of fatigue at the request of Jeremy Waters.  History of Present Illness: Jeremy Waters is a very pleasant 78 year old male with the above listed medical problems who tells me that over the last 3 weeks she has had increasing fatigue.  He has a complex medical history including a maze and left atrial appendage ligation with atricure.  Of note he had a CT scan in January 2025 that demonstrated an occluded left atrial appendage with no residual leak.  He has not been anticoagulation since his surgery in 2024.  Dr. Amanda Waters had prescribed the patient 30 mg of diltiazem  on a as needed basis but he is never taken it.  He tells me that over the last 3 weeks she has had increasing fatigue.  He can normally exercise for about 40 minutes but now can only exercise for 30 minutes.  He tells me his fatigue is generalized and occurs not any specific time.  He denies any presyncope or syncope.  He has never noticed palpitations recently or in his history.  He denies any paroxysmal nocturnal dyspnea, orthopnea, or chest pain.  Because the symptoms were not abating and because he has plans to leave to [place] next Saturday he came to the emergency department.  Here his EKG demonstrated atrial flutter.  His BNP is elevated at 277 which is actually down from 352 done a year ago.  Here has been reassuring with negative troponins, normal lactate, and negative PE protocol CT scan.   His evaluation he was started on a diltiazem  infusion and converted back to sinus bradycardia.  He currently is without complaints and feels relatively well.   Past Medical History:  Diagnosis Date   Adenomatous colon polyp 09/2007   Arthritis    OA / PAIN LEFT KNEE   Cancer (HCC)    prostate cancer   Cataract    removed both eyes   Coronary artery disease    COVID-19 virus infection    Diverticulosis of colon    w/o hemorrage   Heart murmur    TOLD HE HAS A SLIGHT MURMUR   Hyperlipidemia    Inguinal hernia    left side    Thyroid  disease     Past Surgical History:  Procedure Laterality Date   AORTIC VALVE REPLACEMENT N/A 08/18/2022   Procedure: AORTIC VALVE REPLACEMENT (AVR) USING EDWARDS INSPIRIS AORTIC VALVE SIZE ;  Surgeon: Jeremy Grew, MD;  Location: [facility] OR;  Service: Open Heart Surgery;  Laterality: N/A;   BACK SURGERY  05/12/2005   L5   CARDIAC CATHETERIZATION     CLIPPING OF ATRIAL APPENDAGE N/A 08/18/2022   Procedure: CLIPPING OF ATRIAL APPENDAGE USING ATRICURE 45 ATRICLIP;  Surgeon: Jeremy Grew, MD;  Location: [facility] OR;  Service: Open Heart Surgery;  Laterality: N/A;  Median Sternotomy   COLONOSCOPY  1999   Negative; Dr Sandrea Cruel   COLONOSCOPY  2004   tics, hemorrhoids   COLONOSCOPY  2009  polyps (T.A.)   CORONARY ARTERY BYPASS GRAFT N/A 08/18/2022   Procedure: CORONARY ARTERY BYPASS GRAFTING (CABG) X2 USING LEFT INTERNAL MAMMARY ARTERY AND LEFT ENDOSCOPICALLY HARVESTED GREATER SAPHENOUS VEIN.;  Surgeon: Jeremy Grew, MD;  Location: [facility] OR;  Service: Open Heart Surgery;  Laterality: N/A;   HERNIA REPAIR  1987   HERNIA REPAIR  10/2003   KNEE SURGERY  1969   Patellar fracture fragment Lt   LAMINECTOMY  2000   L4-5   LEFT HEART CATH AND CORONARY ANGIOGRAPHY N/A 08/16/2022   Procedure: LEFT HEART CATH AND CORONARY ANGIOGRAPHY;  Surgeon: Jeremy Phy, MD;  Location: [facility] INVASIVE CV LAB;  Service: Cardiovascular;  Laterality: N/A;   LUMBAR  LAMINECTOMY/DECOMPRESSION MICRODISCECTOMY Right 10/08/2015   Procedure: MICRO LUMBAR DECOMPRESSION L4-L5 AND L5-S1 ON RIGHT ;  Surgeon: Jeremy Blanch, MD;  Location: [facility] ORS;  Service: Orthopedics;  Laterality: Right;   MASS EXCISION  04/26/2011   Procedure: MINOR EXCISION OF MASS;  Surgeon: Jeremy Pata, MD;  Location: [facility];  Service: General;  Laterality: Right;  Excise subcutaneous mass right axilla Minor Room   MAZE N/A 08/18/2022   Procedure: MAZE USING ATRICURE Jeremy Waters;  Surgeon: Jeremy Grew, MD;  Location: [facility] OR;  Service: Open Heart Surgery;  Laterality: N/A;   POLYPECTOMY     PROSTATECTOMY  04/16/2005   TEE WITHOUT CARDIOVERSION N/A 08/18/2022   Procedure: TRANSESOPHAGEAL ECHOCARDIOGRAM;  Surgeon: Jeremy Grew, MD;  [facility];  Service: Open Heart Surgery;  Laterality: N/A;   TONSILLECTOMY     TOTAL KNEE ARTHROPLASTY Left 07/03/2013   Procedure: LEFT TOTAL KNEE ARTHROPLASTY;  Surgeon: Jeremy Bucks, MD;  Location: [facility] ORS;  Service: Orthopedics;  Laterality: Left;     Home Medications:  Prior to Admission medications   Medication Sig Start Date End Date Taking? Authorizing Provider  beta carotene  81191 UNIT capsule Take 25,000 Units by mouth daily.   Yes [provider]  clonazePAM  (KLONOPIN ) 0.5 MG tablet Take 1 tablet (0.5 mg total) by mouth at bedtime as needed for anxiety. 03/28/23  Yes Burns, Beckey Bourgeois, MD  clopidogrel  (PLAVIX ) 75 MG tablet Take 1 tablet (75 mg total) by mouth daily. 08/17/23 08/16/24 Yes Jeremy Campus, MD  Evolocumab  (REPATHA  SURECLICK) 140 MG/ML SOAJ INJECT 140 MG INTO THE SKIN EVERY 14 DAYS 02/25/23  Yes Jeremy Campus, MD  fluticasone  (FLONASE ) 50 MCG/ACT nasal spray SPRAY 1 SPRAY IN EACH NOSTRIL ONCE DAILY 10/31/23  Yes Burns, Beckey Bourgeois, MD  furosemide  (LASIX ) 20 MG tablet Take 1 tablet (20 mg total) by mouth daily as needed. Patient taking differently: Take 20 mg by mouth daily as needed for fluid. 11/04/23  Yes Burns,  Beckey Bourgeois, MD  levothyroxine  (SYNTHROID ) 50 MCG tablet Take 1 tablet (50 mcg total) by mouth daily. 06/21/23  Yes Burns, Beckey Bourgeois, MD  pyridOXINE  (VITAMIN B-6) 100 MG tablet Take 100 mg by mouth daily.   Yes [provider]  rosuvastatin  (CRESTOR ) 40 MG tablet TAKE 1 TABLET BY MOUTH DAILY Patient taking differently: Take 40 mg by mouth every evening. 09/02/23  Yes Burns, Beckey Bourgeois, MD  vitamin C  (ASCORBIC ACID ) 500 MG tablet Take 500 mg by mouth 2 (two) times daily.   Yes [provider]  vitamin E 400 UNIT capsule Take 400 Units by mouth daily.   Yes [provider]  metoprolol  succinate (TOPROL  XL) 25 MG 24 hr tablet Take 0.5 tablets (12.5 mg total) by mouth at  bedtime. 11/09/23 02/07/24  Unk Garb, DO    Scheduled Meds:  clopidogrel   75 mg Oral Daily   enoxaparin  (LOVENOX ) injection  40 mg Subcutaneous Q24H   levothyroxine   50 mcg Oral Q0600   rosuvastatin   40 mg Oral QPM   sodium chloride  flush  3 mL Intravenous Q12H   Continuous Infusions:  PRN Meds: acetaminophen  **OR** acetaminophen , clonazePAM , ondansetron  **OR** ondansetron  (ZOFRAN ) IV, oxyCODONE , polyethylene glycol  Allergies:    Allergies  Allergen Reactions   Atorvastatin Other (See Comments)    Increased LFTs    Social History:   Social History   Socioeconomic History   Marital status: Married    Spouse name: Mary   Number of children: 2   Years of education: 16   Highest education level: Bachelor's degree (e.g., BA, AB, BS)  Occupational History   Occupation: insur exe    Employer: [employer]   Occupation: Retired  Tobacco Use   Smoking status: Former    Current packs/day: 0.00    Types: Cigarettes    Quit date: 1983    Years since quitting: 42.4   Smokeless tobacco: Never   Tobacco comments:    stopped smoking cigarettes 1983, 1 cigar /day 2001-2013  Vaping Use   Vaping status: Never Used  Substance and Sexual Activity   Alcohol use: Yes    Alcohol/week: 7.0 - 14.0  standard drinks of alcohol    Types: 7 - 14 Standard drinks or equivalent per week    Comment: scotch or wine daily   Drug use: No   Sexual activity: Yes  Other Topics Concern   Not on file  Social History Narrative   Exercises daily.  Lives with wife.  Has one puppy.            Social Drivers of Corporate investment banker Strain: Low Risk  (03/07/2023)   Overall Financial Resource Strain (CARDIA)    Difficulty of Paying Living Expenses: Not hard at all  Food Insecurity: No Food Insecurity (03/07/2023)   Hunger Vital Sign    Worried About Running Out of Food in the Last Year: Never true    Ran Out of Food in the Last Year: Never true  Transportation Needs: Unknown (03/07/2023)   PRAPARE - Administrator, Civil Service (Medical): No    Lack of Transportation (Non-Medical): Not on file  Physical Activity: Sufficiently Active (03/07/2023)   Exercise Vital Sign    Days of Exercise per Week: 5 days    Minutes of Exercise per Session: 40 min  Stress: No Stress Concern Present (03/08/2023)   [employer] of Occupational Health - Occupational Stress Questionnaire    Feeling of Stress : Only a little  Social Connections: Socially Integrated (03/07/2023)   Social Connection and Isolation Panel    Frequency of Communication with Friends and Family: More than three times a week    Frequency of Social Gatherings with Friends and Family: Three times a week    Attends Religious Services: More than 4 times per year    Active Member of Clubs or Organizations: Yes    Attends Banker Meetings: More than 4 times per year    Marital Status: Married  Catering manager Violence: Not At Risk (03/08/2023)   Humiliation, Afraid, Rape, and Kick questionnaire    Fear of Current or Ex-Partner: No    Emotionally Abused: No    Physically Abused: No    Sexually Abused: No  Family History:    Family History  Problem Relation Age of Onset   Lung cancer Mother    COPD  Mother    Cancer Father        Bladder Cancer   Prostate cancer Father    Colon cancer Father 42   Urolithiasis Father    Lung cancer Sister    Alcohol abuse Paternal Uncle    Stroke Paternal Uncle 47   Hypertension Paternal Uncle    Asthma Neg Hx    Heart disease Neg Hx    Esophageal cancer Neg Hx    Stomach cancer Neg Hx    Liver disease Neg Hx    Colon polyps Neg Hx    Rectal cancer Neg Hx      ROS:  Please see the history of present illness.   All other ROS reviewed and negative.     Physical Exam/Data: Vitals:   11/09/23 0732 11/09/23 0734 11/09/23 0800 11/09/23 0915  BP: 132/74  129/67 108/68  Pulse: (!) 58  (!) 56 60  Resp: 13  13 17   Temp:  97.8 F (36.6 C)    TempSrc:  Oral    SpO2: 98%  97% 96%  Weight:      Height:       No intake or output data in the 24 hours ending 11/09/23 1115    11/08/2023    3:35 PM 11/01/2023    8:32 AM 05/09/2023    8:11 AM  Last 3 Weights  Weight (lbs) 153 lb 153 lb 149 lb  Weight (kg) 69.4 kg 69.4 kg 67.586 kg     Body mass index is 23.96 kg/m.  General:  Well nourished, well developed, in no acute distress HEENT: normal Neck: no JVD Vascular: No carotid bruits; Distal pulses 2+ bilaterally Cardiac:  normal S1, S2; RRR; no murmur  Lungs:  clear to auscultation bilaterally, no wheezing, rhonchi or rales  Abd: soft, nontender, no hepatomegaly  Ext: no edema Musculoskeletal:  No deformities, BUE and BLE strength normal and equal Skin: warm and dry  Neuro:  CNs 2-12 intact, no focal abnormalities noted Psych:  Normal affect   EKG:  The EKG was personally reviewed and demonstrates: Sinus bradycardia with incomplete right bundle branch block; previous EKGs demonstrate atrial flutter with variable conduction Telemetry:  Telemetry was personally reviewed and demonstrates: Sinus bradycardia  Relevant CV Studies: TTE 2024 1. Left ventricular ejection fraction, by estimation, is 50 to 55%. The  left ventricle has low normal  function. Left ventricular diastolic  parameters were normal.   2. Right ventricular systolic function is mildly reduced. The right  ventricular size is normal. There is normal pulmonary artery systolic  pressure.   3. Left atrial size was mildly dilated.   4. The mitral valve is normal in structure. Mild mitral valve  regurgitation.   5. S/p AVR (23 mm Edwards Inspiris (March 2024). Difficult to visualize  Peak and mean gradients through the valve are 18 and 10 mm Hg respectively  . The aortic valve has been repaired/replaced. Aortic valve regurgitation  is not visualized. There is a 23   mm Edwards Inspiris valve present in the aortic position. Procedure Date:  08/18/22.   6. The inferior vena cava is normal in size with greater than 50%  respiratory variability, suggesting right atrial pressure of 3 mmHg.   Laboratory Data: High Sensitivity Troponin:   Recent Labs  Lab 11/08/23 1555 11/08/23 1756  TROPONINIHS 3 4  Chemistry Recent Labs  Lab 11/08/23 1555 11/09/23 0530  NA 138 137  K 3.8 3.8  CL 103 104  CO2 23 21*  GLUCOSE 126* 103*  BUN 17 12  CREATININE 1.14 1.01  CALCIUM  9.5 8.9  MG 2.4  --   GFRNONAA >60 >60  ANIONGAP 12 12    Recent Labs  Lab 11/08/23 1555  PROT 7.0  ALBUMIN  3.6  AST 31  ALT 18  ALKPHOS 55  BILITOT 0.9   Lipids No results for input(s): CHOL, TRIG, HDL, LABVLDL, LDLCALC, CHOLHDL in the last 168 hours.  Hematology Recent Labs  Lab 11/08/23 1555 11/09/23 0530  WBC 8.3 7.8  RBC 4.25 4.31  HGB 14.2 14.4  HCT 41.6 42.7  MCV 97.9 99.1  MCH 33.4 33.4  MCHC 34.1 33.7  RDW 13.9 13.8  PLT 235 198   Thyroid   Recent Labs  Lab 11/08/23 1555  TSH 1.288  FREET4 0.95    BNP Recent Labs  Lab 11/08/23 1555  BNP 277.9*    DDimer No results for input(s): DDIMER in the last 168 hours.  Radiology/Studies:  CT Angio Chest PE W and/or Wo Contrast Result Date: 11/08/2023 CLINICAL DATA:  Left upper abdominal pain,  chest tightness for 2 days, short of breath, tachycardia EXAM: CT ANGIOGRAPHY CHEST WITH CONTRAST TECHNIQUE: Multidetector CT imaging of the chest was performed using the standard protocol during bolus administration of intravenous contrast. Multiplanar CT image reconstructions and MIPs were obtained to evaluate the vascular anatomy. RADIATION DOSE REDUCTION: This exam was performed according to the departmental dose-optimization program which includes automated exposure control, adjustment of the mA and/or kV according to patient size and/or use of iterative reconstruction technique. CONTRAST:  65mL OMNIPAQUE  IOHEXOL  350 MG/ML SOLN COMPARISON:  06/02/2023, 11/08/2023 FINDINGS: Cardiovascular: This is a technically adequate evaluation of the pulmonary vasculature. No filling defects or pulmonary emboli. No pericardial effusion. Stable left atrial dilatation. Occlusion device within the left atrial appendage. Aortic valve prosthesis identified. No evidence of thoracic aortic aneurysm or dissection. Atherosclerosis of the aorta and native coronary vasculature. Previous CABG. Mediastinum/Nodes: No enlarged mediastinal, hilar, or axillary lymph nodes. Thyroid  gland, trachea, and esophagus demonstrate no significant findings. Lungs/Pleura: No acute airspace disease, effusion, or pneumothorax. Central airways are patent. Upper Abdomen: No acute abnormality. Musculoskeletal: No acute or destructive bony abnormalities. Reconstructed images demonstrate no additional findings. Review of the MIP images confirms the above findings. IMPRESSION: 1. No evidence of pulmonary embolus. 2. No acute intrathoracic process. 3. Stable left atrial dilatation. 4.  Aortic Atherosclerosis (ICD10-I70.0). Electronically Signed   By: Bobbye Burrow M.D.   On: 11/08/2023 18:10   CT ABDOMEN PELVIS W CONTRAST Result Date: 11/08/2023 CLINICAL DATA:  Abdomen pain EXAM: CT ABDOMEN AND PELVIS WITH CONTRAST TECHNIQUE: Multidetector CT imaging of the  abdomen and pelvis was performed using the standard protocol following bolus administration of intravenous contrast. RADIATION DOSE REDUCTION: This exam was performed according to the departmental dose-optimization program which includes automated exposure control, adjustment of the mA and/or kV according to patient size and/or use of iterative reconstruction technique. CONTRAST:  65mL OMNIPAQUE  IOHEXOL  350 MG/ML SOLN COMPARISON:  CT 03/16/2023 FINDINGS: Lower chest: Lung bases demonstrate no acute airspace disease. Mild cardiomegaly. Aortic valve prosthesis. Hepatobiliary: Stable hepatic cysts. No calcified gallstone or biliary dilatation. Pancreas: Unremarkable. No pancreatic ductal dilatation or surrounding inflammatory changes. Spleen: Normal in size without focal abnormality. Adrenals/Urinary Tract: Adrenal glands are normal. Small nonobstructing kidney stones. Enlarged bilateral extrarenal pelvises without hydroureter or  obstructing stone. The bladder is unremarkable Stomach/Bowel: Stomach is within normal limits. Appendix appears normal. No evidence of bowel wall thickening, distention, or inflammatory changes. Diverticular disease of the left colon without acute inflammation. Vascular/Lymphatic: Aortic atherosclerosis. No enlarged abdominal or pelvic lymph nodes. Reproductive: Prostatectomy Other: Negative for pelvic effusion or free air Musculoskeletal: Multilevel degenerative changes. No acute osseous abnormality IMPRESSION: 1. No CT evidence for acute intra-abdominal or pelvic abnormality. 2. Diverticular disease of the left colon without acute inflammation. 3. Nonobstructing kidney stones. 4. Aortic atherosclerosis.  Cardiomegaly Aortic Atherosclerosis (ICD10-I70.0). Electronically Signed   By: Esmeralda Hedge M.D.   On: 11/08/2023 18:08   DG Chest Portable 1 View Result Date: 11/08/2023 CLINICAL DATA:  Shortness of breath and 2 days of chest tightness EXAM: PORTABLE CHEST 1 VIEW COMPARISON:  Chest  radiograph dated 11/01/2023 FINDINGS: Normal lung volumes. No focal consolidations. No pleural effusion or pneumothorax. Similar postsurgical cardiomediastinal silhouette. Median sternotomy wires are nondisplaced. IMPRESSION: No acute disease. Electronically Signed   By: Limin  Xu M.D.   On: 11/08/2023 16:39     Assessment and Plan: Fatigue: This certainly could be due to underlying atrial flutter/fibrillation.  Currently the patient is in sinus bradycardia.  I think it is reasonable for the patient to be discharged home.  Will place a 1 week live monitor on.  Patient will see his primary cardiologist Dr. Patwardin next week.  If his monitor shows a high atrial arrhythmia burden and I think he needs to be perhaps consider an atrial flutter ablation given his symptoms of fatigue.  He is other evaluation including thyroid  levels, electrolytes, and CBC are all relatively normal.  If an ablation or cardioversion were to be required this would certainly affect his trip to [place] planned for next Saturday.  I did discuss this important issue with the patient and his wife and they understand and agree with the plan. History of paroxysmal atrial fibrillation: Now status post maze and atriclip.  He has complete closure of his left atrial appendage with no residual leak so does not require anticoagulation at this time.  If cardioversion or ablation were to be pursued the patient would need to be restarted on anticoagulation.  I discussed this with EP. Status post AVR: Last echocardiogram in 2024 demonstrates good valve function.  No signs or symptoms of heart failure.  Discussed with Dr. Lela Purple at the bedside; will obtain monitor.  No need for anticoagulation at this time.  Will arrange for cardiology follow-up in 1 week.  Risk Assessment/Risk Scores:         CHA2DS2-VASc Score = 3   This indicates a 3.2% annual risk of stroke. The patient's score is based upon: CHF History: 0 HTN History: 1 Diabetes  History: 0 Stroke History: 0 Vascular Disease History: 0 Age Score: 2 Gender Score: 0        For questions or updates, please contact [facility] Please consult [email or web address] for contact info under    Signed, Jebediah Macrae K Briya Lookabaugh, MD  11/09/2023 11:15 AM

## 2023-11-09 NOTE — Assessment & Plan Note (Signed)
 11-09-2023 stable. TSH and FT4 were normal. Continue synthroid  50 mcg daily.

## 2023-11-09 NOTE — Progress Notes (Signed)
 Zio ordered per MD

## 2023-11-10 ENCOUNTER — Telehealth: Payer: Self-pay | Admitting: Physician Assistant

## 2023-11-10 DIAGNOSIS — I480 Paroxysmal atrial fibrillation: Secondary | ICD-10-CM | POA: Diagnosis not present

## 2023-11-10 NOTE — Telephone Encounter (Signed)
 [facility] called w/ first documentation of atrial fib. Max rate < 100, asymptomatic.  Lasted about 90 sec, now back in SR.   They will institute their standard protocols re: additional notifications.   Armandina Bernard, PA-C 11/10/2023 7:18 PM

## 2023-11-16 DIAGNOSIS — H6122 Impacted cerumen, left ear: Secondary | ICD-10-CM | POA: Diagnosis not present

## 2023-11-17 ENCOUNTER — Ambulatory Visit: Attending: Cardiology | Admitting: Cardiology

## 2023-11-17 ENCOUNTER — Other Ambulatory Visit (HOSPITAL_COMMUNITY): Payer: Self-pay

## 2023-11-17 ENCOUNTER — Encounter: Payer: Self-pay | Admitting: Cardiology

## 2023-11-17 VITALS — BP 135/88 | HR 63 | Ht 67.0 in | Wt 150.0 lb

## 2023-11-17 DIAGNOSIS — I2510 Atherosclerotic heart disease of native coronary artery without angina pectoris: Secondary | ICD-10-CM | POA: Diagnosis not present

## 2023-11-17 DIAGNOSIS — I480 Paroxysmal atrial fibrillation: Secondary | ICD-10-CM

## 2023-11-17 DIAGNOSIS — Z952 Presence of prosthetic heart valve: Secondary | ICD-10-CM | POA: Diagnosis not present

## 2023-11-17 MED ORDER — METOPROLOL SUCCINATE ER 25 MG PO TB24
25.0000 mg | ORAL_TABLET | Freq: Every day | ORAL | 1 refills | Status: DC
  Filled 2023-11-17: qty 90, 90d supply, fill #0

## 2023-11-17 NOTE — Progress Notes (Signed)
 Cardiology Office Note:  .   Date:  11/17/2023  ID:  Lamar KATHEE Bear, DOB 1945-08-04, MRN [id number] PCP: [name] PARAS, MD  [facility] Providers Cardiologist:  Newman Lawrence, MD PCP: [name] PARAS, MD  Chief Complaint  Patient presents with   Fatigue     MAJID MCCRAVY is a 78 y.o. male with hyperlipidemia, CAD s/p CABG x 2 (LIMA - LAD, SVG - RCA), s/p SAVR, Maze procedure w/LAA ligation (07/2022), recurrent PAF, generalized fatigue  History of Present Illness  Patient is here today with his daughter.  He was previously seen by Dr. Alvan.  Recently, Dr. Geofm had referred him to me due to his complaints of fatigue.  1 week ago, his Apple Watch alerted him of A-fib, therefore he sought medical attention in the emergency room.  He was seen by Dr. Wendel.  It was felt that atrial flutter or fibrillation could be causing his fatigue symptoms, and he was placed on 1 week live ZIO monitor.  Detailed report is not available to me at, but initial review of the daily tracings show A-fib percentage anywhere from 9% to 28%.  Talking to the patient more in detail, he has been had generalized fatigue symptoms for last 6 months, but stays very active with 4 mile walk on elliptical and regular workout without any complains of chest pain or shortness of breath.  He does feel tired and fatigued after the workout and takes a nap.  He usually wakes up around 4-4:30 AM most mornings, denies any particular snoring history.  He has not had a sleep study in a long time.  Patient reportedly had A-fib soon after his bypass, aortic valve replacement, and maze surgery in 07/2022.  However, A-fib was not reported on his Apple Watch since then, until a week ago.  He does not notice any palpitation symptoms, even when his Apple Watch is reporting A-fib with rate of 150 bpm.  Dr. Geofm had prescribed him diltiazem  30 mg as needed, but he has not used it.  He was started on metoprolol  succinate 12.5 mg  daily during his hospital visit last week.  He is tolerating it well.  Blood pressure is elevated today, generally blood pressure runs around 110/70 mmHg at home and has been even lower in the past.  Patient currently has a trip to [place] planned.  In fact, his wife is already in [place].  He is going to meet his wife in [place] and then visit few other countries.  Trip will last for 12 days.     Vitals:   11/17/23 1022  BP: 135/88  Pulse: 63  SpO2: 97%      Review of Systems  Constitutional: Positive for malaise/fatigue.  Cardiovascular:  Negative for chest pain, dyspnea on exertion, leg swelling, palpitations and syncope.        Studies Reviewed: SABRA         Labs 2023/12/19: Chol 119, TG 77, HDL 72, LDL 32 HbA1C 5.4% Hb 14.4 Cr 1.0 TSH 1.2  Cardiac CT 05/2023: 1. Occluded LAA with 45 mm Atricure clip in good position with no leak  2.  Severe LAE and moderate RAE  3. Normal appearing 23 mm bioprosthetic Inspiris AVR with intact annulus and no HALT/HAM  4. Atrial septal aneurysm bowed to right suggesting high LA pressure no PFO/ASD  5.  Patent SVG to RCA and LIMA to LAD  6.  Normal PV anatomy see measurements below  7.  Upper  normal ascending thoracic aorta 3.6 cm  Echocardiogram 09/2022:  1. Left ventricular ejection fraction, by estimation, is 50 to 55%. The  left ventricle has low normal function. Left ventricular diastolic  parameters were normal.   2. Right ventricular systolic function is mildly reduced. The right  ventricular size is normal. There is normal pulmonary artery systolic  pressure.   3. Left atrial size was mildly dilated.   4. The mitral valve is normal in structure. Mild mitral valve  regurgitation.   5. S/p AVR (23 mm Edwards Inspiris (March 2024). Difficult to visualize  Peak and mean gradients through the valve are 18 and 10 mm Hg respectively  . The aortic valve has been repaired/replaced. Aortic valve regurgitation  is not visualized. There  is a 23   mm Edwards Inspiris valve present in the aortic position. Procedure Date:  08/18/22.   6. The inferior vena cava is normal in size with greater than 50%  respiratory variability, suggesting right atrial pressure of 3 mmHg.   Op note 07/2022: Aortic Valve Replacement (23mm INSPIRIS, SN [id number]) MAZE (Pulmonary vein isolation, bilateral) Left atrial appendage clip (45mm Atriclip) CABG x 2 (LIMA - LAD, SVG - RCA)  Coronary angiogram 07/2022:   Ost LAD to Prox LAD lesion is 90% stenosed.   Dist RCA lesion is 95% stenosed.   Mid RCA lesion is 80% stenosed.   Dist Cx lesion is 90% stenosed.   Mid LAD lesion is 65% stenosed.   1.  High-grade serial lesions of right coronary artery. 2.  High-grade calcified proximal LAD stenosis; the LAD is a relatively calcified vessel and require long segment stenting peripheral revascularization. 3.  High-grade distal left circumflex disease. 4.  LVEDP of 3 mmHg.    Risk Assessment/Calculations:    CHA2DS2-VASc Score = 3  This indicates a 3.2% annual risk of stroke. The patient's score is based upon: CHF History: 0 HTN History: 1 Diabetes History: 0 Stroke History: 0 Vascular Disease History: 0 Age Score: 2 Gender Score: 0      Physical Exam Vitals and nursing note reviewed.  Constitutional:      General: He is not in acute distress. Neck:     Vascular: No JVD.   Cardiovascular:     Rate and Rhythm: Normal rate and regular rhythm.     Heart sounds: Normal heart sounds. No murmur heard. Pulmonary:     Effort: Pulmonary effort is normal.     Breath sounds: Normal breath sounds. No wheezing or rales.   Musculoskeletal:     Right lower leg: No edema.     Left lower leg: No edema.      VISIT DIAGNOSES:   ICD-10-CM   1. Coronary artery disease, unspecified vessel or lesion type, unspecified whether angina present, unspecified whether native or transplanted heart  I25.10 EKG 12-Lead       RALLY OUCH is a 78 y.o.  male with hyperlipidemia, CAD s/p CABG x 2 (LIMA - LAD, SVG - RCA), s/p SAVR, Maze procedure w/LAA ligation (07/2022), recurrent PAF, generalized fatigue Assessment & Plan  Fatigue: Present for last 6 months, possibly related to A-fib.  Even though he does not have snoring history, I think it is reasonable to check sleep study.  See below regarding A-fib management.  Paroxysmal A-fib: Detailed report from Zio pending, but daily tracings show A-fib burden anywhere from 9% to 28%. This is much higher than what his Apple Watch is showing at <2%. It is plausible to  say that A-fib is under estimated by his Apple Watch and could very well be causing his fatigue symptoms. We discussed A-fib rate versus rhythm control management. He is interested in rhythm control, and is leaning towards ablation consideration.  Therefore, I would not recommend starting any antiarrhythmic therapy at this time. While his A-fib burden may be considerable, he is fairly asymptomatic acutely, does not have any palpitation symptoms etc.  It is very possible that his A-fib has been going on longer than reported by his Apple Watch.  I will increase his metoprolol  succinate to 25 mg daily, and encouraged him to use diltiazem  30 mg as needed should his Apple Watch alerted him regarding rapid RVR 150 bpm for, then 30 minutes.  I will refer him to EP to consider ablation after his trip from [place]. Given that he has had maze procedure with confirmation of appendage occlusion on CT scan in 05/2023, he does not need anticoagulation at this time.  However, if he considers ablation strategy, he will likely need anticoagulation for at least for a few months.  After further discussion with the patient, he would like to hold off anticoagulation at this time, but is open to starting it after his EP consultation visit in a few weeks. Given that he is euvolemic, and unlikely to have heart failure, I have stopped his Lasix  Given that his A-fib is  ongoing for likely several months, causing fatigue symptoms is a chronic issue, but not having any acute concerns, it is reasonable for him to continue his [place] trip.  However, he knows if and when to seek medical attention, should he have prolonged episodes of A-fib with RVR not controlled with metoprolol  and diltiazem , patient, and daughter are in agreement with these recommendations.  CAD: Prior CABG.  No angina symptoms at this time. Continue Plavix , Repatha .     Meds ordered this encounter  Medications   metoprolol  succinate (TOPROL  XL) 25 MG 24 hr tablet    Sig: Take 1 tablet (25 mg total) by mouth at bedtime.    Dispense:  90 tablet    Refill:  1    Dose increase     F/u in 6 months  I spent 45 minutes in the care of KELSO BIBBY today including reviewing prior charts, records, labs, studies, including cardiac catheterization in 07/2022, surgical operative note in 07/2022, CT scan in 05/2023, recent ER visit in 10/2023, daily tracings from Zio monitor, discussing treatment options, coordinating care, and documenting in the encounter.   Signed, Newman JINNY Lawrence, MD

## 2023-11-17 NOTE — Patient Instructions (Signed)
 Medication Instructions:   INCREASE YOUR METOPROLOL  SUCCINATE (TOPROL  XL) TO 25 MG BY MOUTH DAILY AT BEDTIME   STOP TAKING LASIX  NOW  *If you need a refill on your cardiac medications before your next appointment, please call your pharmacy*   You have been referred to SEE ELECTROPHYSIOLOGIST HERE IN THE OFFICE FOR CONSIDERATION OF AFIB ABLATION--AT THIS APPOINTMENT PLEASE PROVIDE THE PATIENT WITH HIS ITAMAR SLEEP STUDY PER DR. PATWARDHAN   Testing/Procedures:  Your physician has recommended that you have a ITAMAR sleep study. This test records several body functions during sleep, including: brain activity, eye movement, oxygen and carbon dioxide blood levels, heart rate and rhythm, breathing rate and rhythm, the flow of air through your mouth and nose, snoring, body muscle movements, and chest and belly movement.  PLEASE PROVIDE THE PATIENT WITH HIS ITAMAR SLEEP STUDY AT HIS APPOINTMENT WITH THE ELECTROPHYSIOLOGIST PER DR. PATWARDHAN--ORDER ALREADY IN AND STOP BANG SCORE COMPLETE--PATIENT IS LEAVING OUT OF COUNTRY FOR 2 WEEKS   Follow-Up: At [facility], you and your health needs are our priority.  As part of our continuing mission to provide you with exceptional heart care, our providers are all part of one team.  This team includes your primary Cardiologist (physician) and Advanced Practice Providers or APPs (Physician Assistants and Nurse Practitioners) who all work together to provide you with the care you need, when you need it.  Your next appointment:   6 month(s)  Provider:   DR. PATWARDHAN

## 2023-11-18 ENCOUNTER — Ambulatory Visit: Admitting: Cardiology

## 2023-11-22 ENCOUNTER — Telehealth: Payer: Self-pay

## 2023-11-22 NOTE — Telephone Encounter (Signed)
**Note De-Identified Quinetta Shilling Obfuscation** I started a Itamar-HST PA through [employer] provider portal and it is currently pending review. Outpatient Authorization [id number]

## 2023-11-23 ENCOUNTER — Telehealth: Payer: Self-pay

## 2023-11-23 NOTE — Telephone Encounter (Signed)
-----   Message from Nurse Porter HERO sent at 11/17/2023 11:19 AM EDT ----- Regarding: ITAMAR SLEEP STUDY PER DR. PATWARDHAN Dr. Elmira saw this pt in clinic today and ordered for him to get an [facility] Sleep study done but he wants you to give him the equipment when he comes back into the office to see EP  We referred him to EP and they will be calling him to schedule this appt.   The pt is leaving out of the country today for the next 2 weeks, which is why Dr. Elmira is wanting you to give him the equipment when he comes back for follow-up with EP (whenever that appt is made)  I did place the Itamar order in the system and went ahead and did the stop bang  Can you please make sure this gets done and let Lyle Rigg RN know when you provided the pt the equipment at next follow-up with EP?   Thanks, [employer]

## 2023-11-23 NOTE — Telephone Encounter (Signed)
 Prior Auth for [facility] Sleep Study is currently pending, if approved, patient will pick up device at next appointment with Dr. Nancey.

## 2023-11-28 ENCOUNTER — Other Ambulatory Visit: Payer: Self-pay | Admitting: Pharmacist

## 2023-11-28 DIAGNOSIS — Z951 Presence of aortocoronary bypass graft: Secondary | ICD-10-CM

## 2023-11-28 DIAGNOSIS — I214 Non-ST elevation (NSTEMI) myocardial infarction: Secondary | ICD-10-CM

## 2023-11-28 DIAGNOSIS — E782 Mixed hyperlipidemia: Secondary | ICD-10-CM

## 2023-11-28 MED ORDER — REPATHA SURECLICK 140 MG/ML ~~LOC~~ SOAJ: 140.0000 mg | SUBCUTANEOUS | 3 refills | Status: AC

## 2023-11-29 NOTE — Telephone Encounter (Signed)
**Note De-Identified Donavon Kimrey Obfuscation** Ordering provider: Dr Elmira Associated diagnoses: Snoring-R06.83 and Somnolence-R40.0  WatchPAT PA obtained on 11/29/2023 by Brealynn Contino, Avelina HERO, LPN. Authorization: Per the [employer] provider portal this Itamar-HST PA has been approved. Service Dates: 11/22/2023 - 02/20/2024 Outpatient Authorization #[id number]  4.   Patient notified of PIN (1234) on 11/29/2023 Carlesha Seiple Notification Method: MyChart message. I also called the pt but got no answer so I left a message on his VM (Ok per [facility]) advising him of this PA approval and I provided  the WatchPAT One [facility] Sleep Study Pin # of [id number].  Phone note routed to covering staff for follow-up.

## 2023-12-01 ENCOUNTER — Other Ambulatory Visit: Payer: Self-pay | Admitting: Internal Medicine

## 2023-12-06 ENCOUNTER — Encounter: Payer: Self-pay | Admitting: Cardiology

## 2023-12-06 NOTE — Telephone Encounter (Signed)
 Hope you had a safe and wonderful trip to [place]. No target necessary a such, but if heart rate persists >150 bpm for >30 min or so after resting, this could indicate rapid Afib. Please make sure patient has EP appt in the near future too discuss Afib rhythm control options.  Thanks MJP

## 2023-12-12 NOTE — Addendum Note (Signed)
 Encounter addended by: Malvina Pina A on: 12/12/2023 12:02 PM  Actions taken: Imaging Exam ended

## 2023-12-15 ENCOUNTER — Telehealth: Payer: Self-pay | Admitting: Radiology

## 2023-12-15 ENCOUNTER — Encounter: Payer: Self-pay | Admitting: Family Medicine

## 2023-12-15 ENCOUNTER — Ambulatory Visit (INDEPENDENT_AMBULATORY_CARE_PROVIDER_SITE_OTHER): Admitting: Family Medicine

## 2023-12-15 ENCOUNTER — Ambulatory Visit: Attending: Cardiovascular Disease | Admitting: Cardiovascular Disease

## 2023-12-15 ENCOUNTER — Encounter: Payer: Self-pay | Admitting: Cardiovascular Disease

## 2023-12-15 VITALS — BP 114/60 | HR 59 | Ht 67.0 in | Wt 150.0 lb

## 2023-12-15 VITALS — BP 108/78 | HR 63 | Temp 98.6°F | Ht 67.0 in | Wt 150.6 lb

## 2023-12-15 DIAGNOSIS — J069 Acute upper respiratory infection, unspecified: Secondary | ICD-10-CM

## 2023-12-15 DIAGNOSIS — H66001 Acute suppurative otitis media without spontaneous rupture of ear drum, right ear: Secondary | ICD-10-CM

## 2023-12-15 DIAGNOSIS — B9689 Other specified bacterial agents as the cause of diseases classified elsewhere: Secondary | ICD-10-CM | POA: Diagnosis not present

## 2023-12-15 DIAGNOSIS — I4892 Unspecified atrial flutter: Secondary | ICD-10-CM | POA: Diagnosis not present

## 2023-12-15 DIAGNOSIS — I493 Ventricular premature depolarization: Secondary | ICD-10-CM | POA: Diagnosis not present

## 2023-12-15 DIAGNOSIS — I5032 Chronic diastolic (congestive) heart failure: Secondary | ICD-10-CM | POA: Diagnosis not present

## 2023-12-15 MED ORDER — AZITHROMYCIN 250 MG PO TABS: ORAL_TABLET | ORAL | 0 refills | Status: AC

## 2023-12-15 NOTE — Patient Instructions (Signed)
 I have sent in azithromycin for you to take.  Take 2 tablets today, then 1 tablet daily for the next 4 days.   Follow-up with me for new or worsening symptoms.

## 2023-12-15 NOTE — Patient Instructions (Addendum)
 Medication Instructions:  Your physician recommends that you continue on your current medications as directed. Please refer to the Current Medication list given to you today.  *If you need a refill on your cardiac medications before your next appointment, please call your pharmacy*  Lab Work: None ordered.  You may go to any [facility] Location for your lab work:  [facility] - [address] ([facility]) - [address] [phone] [address]  [place] - [address]   [place]  - [address]   [place] - [address] - [address] [employer]  - [address] [place] - [address] ([facility]   If you have labs (blood work) drawn today and your tests are completely normal, you will receive your results only by: [employer] (if you have MyChart)  If you have any lab test that is abnormal or we need to change your treatment, we will call you or send a MyChart message to review the results.  Testing/Procedures: Will call with dates for procedure  Follow-Up: At [facility], you and your health needs are our priority.  As part of our continuing mission to provide you with exceptional heart care, we have created designated Provider Care Teams.  These Care Teams include your primary Cardiologist (physician) and Advanced Practice Providers (APPs -  Physician Assistants and Nurse Practitioners) who all work together to provide you with the care you need, when you need it.   Your next appointment:   To be scheduled  The format for your next appointment:   In Person  Provider:   Advanced Practice Providers on your designated Care Team:   Charlies Arthur, PA-C Michael Andy Tillery, PA-C Brandy Ollis, NP  Note: Remote monitoring is used to monitor your Pacemaker/ ICD from home. This monitoring reduces the number of office visits required to check your device to one  time per year. It allows us  to keep an eye on the functioning of your device to ensure it is working properly.

## 2023-12-15 NOTE — Progress Notes (Signed)
 Acute Office Visit  Subjective:     Patient ID: Jeremy Waters, male    DOB: 1945/05/29, 78 y.o.   MRN: [id number]  Chief Complaint  Patient presents with   Acute Visit    Ongoing for 1 week. Nasal congestion, dry cough, sneezing, and pressure behind eyes. Denies fevers and SOB. Is blowing out yellow mucus. Has taken 3-4 home covid test, all negative. Has not tried any medications     HPI  Discussed the use of AI scribe software for clinical note transcription with the patient, who gave verbal consent to proceed.  History of Present Illness Dalan Cowger is a 78 year old male who presents with persistent cold symptoms.  Upper respiratory symptoms - Sneezing, rhinorrhea, and tickling sensation in the throat for approximately one week following return from [place] - Coughing occurs every couple of hours and disrupted sleep once last night - No fever, with temperature around 26F - Epistaxis occurs when blowing his nose - No gastrointestinal upset - Maintains appetite and hydration with ginger ale  Otolaryngologic symptoms - No current otalgia - No hearing impairment related to earwax buildup - Both ears cleaned a couple of weeks ago     ROS Per HPI      Objective:    BP 108/78 (BP Location: Left Arm, Patient Position: Sitting)   Pulse 63   Temp 98.6 F (37 C) (Temporal)   Ht 5' 7 (1.702 m)   Wt 150 lb 9.6 oz (68.3 kg)   SpO2 96%   BMI 23.59 kg/m    Physical Exam Vitals and nursing note reviewed.  Constitutional:      General: He is not in acute distress.    Appearance: Normal appearance.     Comments: Appears fatigued  HENT:     Head: Normocephalic and atraumatic.     Right Ear: External ear normal. A middle ear effusion is present. Tympanic membrane is erythematous and bulging.     Left Ear: External ear normal.  No middle ear effusion. Tympanic membrane is not erythematous or bulging.     Nose: Nose normal.     Mouth/Throat:     Mouth:  Mucous membranes are moist.     Pharynx: Oropharynx is clear.  Eyes:     Extraocular Movements: Extraocular movements intact.  Cardiovascular:     Rate and Rhythm: Normal rate and regular rhythm.     Pulses: Normal pulses.     Heart sounds: Normal heart sounds.  Pulmonary:     Effort: Pulmonary effort is normal. No respiratory distress.     Breath sounds: Normal breath sounds. No wheezing, rhonchi or rales.  Musculoskeletal:        General: Normal range of motion.     Cervical back: Normal range of motion.     Right lower leg: No edema.     Left lower leg: No edema.  Lymphadenopathy:     Cervical: No cervical adenopathy.  Skin:    General: Skin is warm and dry.  Neurological:     General: No focal deficit present.     Mental Status: He is alert and oriented to person, place, and time.  Psychiatric:        Mood and Affect: Mood normal.        Behavior: Behavior normal.     No results found for any visits on 12/15/23.      Assessment & Plan:   Assessment and Plan Assessment & Plan Upper  Respiratory Tract Infection with Bacterial Sinusitis Cold symptoms with sinusitis and otitis media. Azithromycin  chosen for sinus and ear infection efficacy. - Prescribed azithromycin  (Z-Pak). - Advised hydration and rest. - Instructed to return if no improvement by Monday for reassessment.     No orders of the defined types were placed in this encounter.    Meds ordered this encounter  Medications   azithromycin  (ZITHROMAX ) 250 MG tablet    Sig: Take 2 tablets on day 1, then 1 tablet daily on days 2 through 5    Dispense:  6 tablet    Refill:  0    Return if symptoms worsen or fail to improve.  Corean LITTIE Ku, FNP

## 2023-12-15 NOTE — Progress Notes (Signed)
 Electrophysiology Office Note:    Date:  12/15/2023   ID:  Jeremy Waters, DOB [birth year], MRN [id number]  PCP:  [name] PARAS, MD   [facility] Providers Cardiologist:  Alvan Ronal BRAVO, MD (Inactive)     Referring MD: Elmira Newman PARAS, MD   History of Present Illness:    Jeremy Waters is a 78 y.o. male with a medical history significant for a history of paroxysmal atrial fibrillation ,CABG and surgical AVR with MAZE procedure and left atrial appendage ligation, referred for AF management.      Discussed the use of AI scribe software for clinical note transcription with the patient, who gave verbal consent to proceed.  History of Present Illness Jeremy Waters is a 78 year old male with atrial fibrillation who presents for evaluation of recurrent arrhythmia. He was referred by Dr. Bruna Bright to discuss ways to manage his atrial fibrillation.  In June 2021, he first noted atrial fibrillation on his Apple Watch, accompanied by fatigue. Despite heart rates reaching up to 150 beats per minute, he did not experience any symptoms at that time. Prior to his admission in June, he had three weeks of increasing fatigue and exercise intolerance, impacting his ability to maintain his usual 40-minute exercise routine.  In March 2024, he underwent bypass aortic valve replacement and left atrial appendage occlusion. A CT scan confirmed the occlusion. Initially, he was on Eliquis , which was discontinued after a satisfactory scan in December confirming appendage closure.  He has a history of secondary hypercoagulable state and a CHA2DS2-VASc score of 3.   Recently, he traveled to [place], engaging in a physically demanding activity, walking 90 miles with significant elevation changes, and also went on a river cruise from [place] to [place].         Today, he reports he is doing well and has no acute complaints.  He is at baseline.  EKGs/Labs/Other Studies Reviewed Today:      Echocardiogram:  TTE Sep 30, 2022 LVEF 50 to 55%.  Left atrial size mildly dilated.  Status post aortic valve replacement.   Monitors:  7 day monitor June 2025-- my interpretation Sinus rhythm 48 to 99 bpm, average 65 bpm 3.18% PVC burden, rare supraventricular ectopy 8% burden of atrial arrhythmia, sometimes associated with symptoms.  Rates often not well-controlled. Symptom episodes also related to PVCs    Advanced imaging:  Cardiac CT June 11, 2023 Severe left atrial enlargement.  Moderate right atrial enlargement.  aneurysmal atrial septum.  Atricure appendage clip in good position with occlusion and no leakage.  23 mm bioprosthetic AVR.    EKG:   EKG Interpretation Date/Time:  Thursday December 15 2023 14:30:34 EDT Ventricular Rate:  59 PR Interval:  162 QRS Duration:  102 QT Interval:  400 QTC Calculation: 396 R Axis:   68  Text Interpretation: Sinus bradycardia Incomplete right bundle branch block When compared with ECG of 09-Nov-2023 10:29, No significant change was found Confirmed by Nancey Scotts [phone]) on 12/15/2023 2:36:11 PM     Physical Exam:    VS:  BP 114/60 (BP Location: Right Arm, Patient Position: Sitting, Cuff Size: Normal)   Pulse (!) 59   Ht 5' 7 (1.702 m)   Wt 150 lb (68 kg)   SpO2 98%   BMI 23.49 kg/m     Wt Readings from Last 3 Encounters:  12/15/23 150 lb (68 kg)  12/15/23 150 lb 9.6 oz (68.3 kg)  11/17/23 150 lb (68  kg)     GEN: Well nourished, well developed in no acute distress CARDIAC: RRR, no murmurs, rubs, gallops RESPIRATORY:  Normal work of breathing MUSCULOSKELETAL: no edema    ASSESSMENT & PLAN:     Atypical atrial flutter Persistent atrial fibrillation Status post MAZE procedure With recurrence of atrial fibrillation and atypical flutter Mild symptoms, some fatigue We discussed management options including medical therapy versus EP study, mapping of flutter and ablation.  Using a shared decision making  approach he opted to pursue ablation.  We discussed the indication, rationale, logistics, anticipated benefits, and potential risks of the ablation procedure including but not limited to -- bleed at the groin access site, chest pain, damage to nearby organs such as the diaphragm, lungs, or esophagus, need for a drainage tube, or prolonged hospitalization. I explained that the risk for stroke, heart attack, need for open chest surgery, or even death is very low but not zero. he  expressed understanding and wishes to proceed.    Secondary hypercoagulable state CHA2DS2-VASc score is 3 Status post left atrial appendage occlusion CT confirmed occluded appendage  PVCs Burden < 5%  S/p surgical AVR     Signed, Eulas FORBES Furbish, MD  12/15/2023 2:56 PM    [facility]

## 2023-12-15 NOTE — Telephone Encounter (Signed)
 Patient agreement reviewed and signed on 12/15/2023.  WatchPAT issued to patient on 12/15/2023 by Woodie LOISE Prudent. Patient aware to not open the WatchPAT box until contacted with the activation PIN. Patient profile initialized in Cloud[employer] on 12/15/2023 by Rockie RAMAN. Device serial number: [id number]  Please list Reason for Call as Advice Only and type WatchPAT issued to patient in the comment box.

## 2023-12-17 ENCOUNTER — Ambulatory Visit: Payer: Self-pay | Admitting: Cardiology

## 2023-12-17 DIAGNOSIS — I480 Paroxysmal atrial fibrillation: Secondary | ICD-10-CM | POA: Diagnosis not present

## 2023-12-22 ENCOUNTER — Telehealth: Payer: Self-pay

## 2023-12-22 NOTE — Telephone Encounter (Signed)
 Spoke with Jeremy Waters today.  She will reach out to cardiology because instructions were conflicting.

## 2023-12-22 NOTE — Telephone Encounter (Signed)
 Copied from CRM [id number]. Topic: Clinical - Prescription Issue >> Dec 22, 2023 12:08 PM Chasity T wrote: Reason for CRM: Charmaine calling from [employer] is asking for an updated prescription for metoprolol  succinate (TOPROL  XL) 25 MG 24 hr tablet stating that patient is taking a whole tablet now and not a half. Call back number [phone]

## 2023-12-24 ENCOUNTER — Encounter (INDEPENDENT_AMBULATORY_CARE_PROVIDER_SITE_OTHER): Payer: Self-pay | Admitting: Cardiology

## 2023-12-24 DIAGNOSIS — G4719 Other hypersomnia: Secondary | ICD-10-CM

## 2023-12-24 DIAGNOSIS — R0683 Snoring: Secondary | ICD-10-CM

## 2023-12-26 DIAGNOSIS — Z85828 Personal history of other malignant neoplasm of skin: Secondary | ICD-10-CM | POA: Diagnosis not present

## 2023-12-26 DIAGNOSIS — M542 Cervicalgia: Secondary | ICD-10-CM | POA: Diagnosis not present

## 2023-12-26 DIAGNOSIS — L821 Other seborrheic keratosis: Secondary | ICD-10-CM | POA: Diagnosis not present

## 2023-12-26 DIAGNOSIS — D1801 Hemangioma of skin and subcutaneous tissue: Secondary | ICD-10-CM | POA: Diagnosis not present

## 2023-12-26 DIAGNOSIS — L812 Freckles: Secondary | ICD-10-CM | POA: Diagnosis not present

## 2023-12-26 DIAGNOSIS — L280 Lichen simplex chronicus: Secondary | ICD-10-CM | POA: Diagnosis not present

## 2023-12-26 DIAGNOSIS — M545 Low back pain, unspecified: Secondary | ICD-10-CM | POA: Diagnosis not present

## 2023-12-26 DIAGNOSIS — D225 Melanocytic nevi of trunk: Secondary | ICD-10-CM | POA: Diagnosis not present

## 2023-12-26 DIAGNOSIS — L308 Other specified dermatitis: Secondary | ICD-10-CM | POA: Diagnosis not present

## 2023-12-27 ENCOUNTER — Ambulatory Visit: Attending: Cardiology

## 2023-12-27 DIAGNOSIS — I480 Paroxysmal atrial fibrillation: Secondary | ICD-10-CM

## 2023-12-27 DIAGNOSIS — Z952 Presence of prosthetic heart valve: Secondary | ICD-10-CM

## 2023-12-27 NOTE — Procedures (Signed)
     SLEEP STUDY REPORT Patient Information Study Date: 12/24/2023 Patient Name: Jeremy Waters Patient ID: [id number] Birth Date: July 06, 1945 Age: 78 Gender: Male BMI: 23.5 (W=150 lb, H=5' 7'')  Referring Physician: [place], MD  TEST DESCRIPTION: Home sleep apnea testing was completed using the WatchPat, a Type 1 device, utilizing  peripheral arterial tonometry (PAT), chest movement, actigraphy, pulse oximetry, pulse rate, body position and snore.  AHI was calculated with apnea and hypopnea using valid sleep time as the denominator. RDI includes apneas,  hypopneas, and RERAs. The data acquired and the scoring of sleep and all associated events were performed in  accordance with the recommended standards and specifications as outlined in the AASM Manual for the Scoring of  Sleep and Associated Events 2.2.0 (2015).   FINDINGS: 1. No evidence of Obstructive Sleep Apnea with AHI 3.7/hr.  2. No Central Sleep Apnea. 3. Oxygen desaturations as low as 85%. 4. Moderate to severe snoring was present. O2 sats were < 88% for 0.45minutes. 5. Total sleep time was 7 hrs and 54 min. 6. 11.9% of total sleep time was spent in REM sleep.  7. Normal sleep onset latency at 22 min.  8. Shortened REM sleep onset latency at 37 min.  9. Total awakenings were 14.   DIAGNOSIS:  Normal study with no significant sleep disordered breathing.  RECOMMENDATIONS: 1. Normal study with no significant sleep disordered breathing. 2. Healthy sleep recommendations include: adequate nightly sleep (normal 7-9 hrs/night), avoidance of caffeine after  noon and alcohol near bedtime, and maintaining a sleep environment that is cool, dark and quiet. 3. Weight loss for overweight patients is recommended.  4. Snoring recommendations include: weight loss where appropriate, side sleeping, and avoidance of alcohol before  bed. 5. Operation of motor vehicle or dangerous equipment must be avoided when feeling drowsy,  excessively sleepy, or  mentally fatigued.  6. An ENT consultation which may be useful for specific causes of and possible treatment of bothersome snoring .  7. Weight loss may be of benefit in reducing the severity of snoring.   Signature: Wilbert Bihari, MD; [facility]; Diplomat, American Board of Sleep  Medicine Electronically Signed: 12/27/2023 11:19:37 AM

## 2023-12-28 ENCOUNTER — Other Ambulatory Visit: Payer: Self-pay | Admitting: Cardiology

## 2023-12-28 DIAGNOSIS — Z952 Presence of prosthetic heart valve: Secondary | ICD-10-CM

## 2023-12-28 DIAGNOSIS — I480 Paroxysmal atrial fibrillation: Secondary | ICD-10-CM

## 2023-12-29 ENCOUNTER — Telehealth: Payer: Self-pay | Admitting: *Deleted

## 2023-12-29 MED ORDER — METOPROLOL SUCCINATE ER 25 MG PO TB24: 25.0000 mg | ORAL_TABLET | Freq: Every day | ORAL | 3 refills | Status: DC

## 2023-12-29 NOTE — Telephone Encounter (Signed)
 The patient has been notified of the result and verbalized understanding.  All questions (if any) were answered. Joshua Dalton Seip, CMA 12/29/2023 4:22 PM     Pt is agreeable to normal results.

## 2023-12-29 NOTE — Telephone Encounter (Signed)
-----   Message from Jeremy Waters sent at 12/27/2023 11:21 AM EDT ----- Please let patient know that sleep study showed no significant sleep apnea.

## 2024-01-02 DIAGNOSIS — Z961 Presence of intraocular lens: Secondary | ICD-10-CM | POA: Diagnosis not present

## 2024-01-02 DIAGNOSIS — H4323 Crystalline deposits in vitreous body, bilateral: Secondary | ICD-10-CM | POA: Diagnosis not present

## 2024-01-02 DIAGNOSIS — H0288B Meibomian gland dysfunction left eye, upper and lower eyelids: Secondary | ICD-10-CM | POA: Diagnosis not present

## 2024-01-02 DIAGNOSIS — H35373 Puckering of macula, bilateral: Secondary | ICD-10-CM | POA: Diagnosis not present

## 2024-01-02 NOTE — Telephone Encounter (Signed)
 Resulted

## 2024-01-16 DIAGNOSIS — M542 Cervicalgia: Secondary | ICD-10-CM | POA: Diagnosis not present

## 2024-01-16 DIAGNOSIS — M545 Low back pain, unspecified: Secondary | ICD-10-CM | POA: Diagnosis not present

## 2024-01-26 ENCOUNTER — Encounter: Payer: Self-pay | Admitting: Cardiovascular Disease

## 2024-01-26 ENCOUNTER — Encounter: Payer: Self-pay | Admitting: Internal Medicine

## 2024-01-27 ENCOUNTER — Telehealth: Payer: Self-pay

## 2024-01-27 DIAGNOSIS — I480 Paroxysmal atrial fibrillation: Secondary | ICD-10-CM

## 2024-01-27 NOTE — Telephone Encounter (Signed)
 Called pt to schedule Ablation with Dr. Nancey - he is scheduled for 10/20 at 1000 am.  CT is scheduled on 9/22 at 8:00 - pt will get labs done same day and pick up Rx for Eliquis  that day also.   I will send Instruction letters via MyChart and mail - address verified.

## 2024-02-06 ENCOUNTER — Other Ambulatory Visit: Payer: Self-pay | Admitting: Internal Medicine

## 2024-02-06 DIAGNOSIS — E039 Hypothyroidism, unspecified: Secondary | ICD-10-CM

## 2024-02-06 DIAGNOSIS — E782 Mixed hyperlipidemia: Secondary | ICD-10-CM

## 2024-02-08 ENCOUNTER — Other Ambulatory Visit (HOSPITAL_COMMUNITY): Payer: Self-pay

## 2024-02-08 ENCOUNTER — Telehealth: Payer: Self-pay

## 2024-02-08 DIAGNOSIS — M542 Cervicalgia: Secondary | ICD-10-CM | POA: Diagnosis not present

## 2024-02-08 DIAGNOSIS — M545 Low back pain, unspecified: Secondary | ICD-10-CM | POA: Diagnosis not present

## 2024-02-08 MED ORDER — APIXABAN 5 MG PO TABS
5.0000 mg | ORAL_TABLET | Freq: Two times a day (BID) | ORAL | 5 refills | Status: DC
  Filled 2024-02-08: qty 60, 30d supply, fill #0
  Filled 2024-03-06: qty 60, 30d supply, fill #1
  Filled 2024-04-05: qty 60, 30d supply, fill #2
  Filled 2024-05-05: qty 60, 30d supply, fill #3
  Filled 2024-06-04: qty 60, 30d supply, fill #4

## 2024-02-08 NOTE — Telephone Encounter (Signed)
 Attempted phone call to pt and left voicemail message (ok per EPIC) Eliquis  5 mg - 1 tablet by mouth twice daily, sent to [facility] pharmacy.  Pt advised to start on Monday 02/13/2024 and call [phone] for any questions.

## 2024-02-08 NOTE — Telephone Encounter (Signed)
-----   Message from [facility] April G sent at 01/30/2024  1:32 PM EDT ----- Regarding: Eliquis  Pt will need Eliquis  Rx sent to [facility] prior to Ablation per Dr. Nancey. His Ablation is scheduled for 10/20 so 4 weeks prior to that would be 9/22 for his start date.   Please let the pt know once the Rx is sent in.   Thanks, April

## 2024-02-08 NOTE — Telephone Encounter (Signed)
 Yes, his dose will be 5mg  BID

## 2024-02-10 NOTE — Telephone Encounter (Signed)
 Spoke with pt and advised he will need to pick up Eliquis  from pharmacy to begin on Monday 02/13/2024 - 1 tablet by mouth twice daily.  Pt verbalizes understanding and agrees with current plan.

## 2024-02-13 ENCOUNTER — Ambulatory Visit: Payer: Self-pay | Admitting: Cardiovascular Disease

## 2024-02-13 ENCOUNTER — Ambulatory Visit (HOSPITAL_COMMUNITY)
Admission: RE | Admit: 2024-02-13 | Discharge: 2024-02-13 | Disposition: A | Discharge status: Home / Self Care | Source: Ambulatory Visit | Attending: Cardiology | Admitting: Cardiology

## 2024-02-13 DIAGNOSIS — I480 Paroxysmal atrial fibrillation: Secondary | ICD-10-CM | POA: Insufficient documentation

## 2024-02-13 DIAGNOSIS — Z952 Presence of prosthetic heart valve: Secondary | ICD-10-CM | POA: Diagnosis not present

## 2024-02-13 DIAGNOSIS — I700 Atherosclerosis of aorta: Secondary | ICD-10-CM | POA: Diagnosis not present

## 2024-02-13 LAB — BASIC METABOLIC PANEL WITH GFR
BUN/Creatinine Ratio: 12 (ref 10–24)
BUN: 11 mg/dL (ref 8–27)
CO2: 21 mmol/L (ref 20–29)
Calcium: 9.3 mg/dL (ref 8.6–10.2)
Chloride: 106 mmol/L (ref 96–106)
Creatinine, Ser: 0.94 mg/dL (ref 0.76–1.27)
Glucose: 80 mg/dL (ref 70–99)
Potassium: 4.5 mmol/L (ref 3.5–5.2)
Sodium: 140 mmol/L (ref 134–144)
eGFR: 84 mL/min/1.73 (ref >59)

## 2024-02-13 LAB — CBC
Hematocrit: 40.9 % (ref 37.5–51.0)
Hemoglobin: 13.7 g/dL (ref 13.0–17.7)
MCH: 33.1 pg — ABNORMAL HIGH (ref 26.6–33.0)
MCHC: 33.5 g/dL (ref 31.5–35.7)
MCV: 99 fL — ABNORMAL HIGH (ref 79–97)
Platelets: 230 x10E3/uL (ref 150–450)
RBC: 4.14 x10E6/uL (ref 4.14–5.80)
RDW: 13 % (ref 11.6–15.4)
WBC: 5.3 x10E3/uL (ref 3.4–10.8)

## 2024-02-13 MED ORDER — IOHEXOL 350 MG/ML SOLN
80.0000 mL | Freq: Once | INTRAVENOUS | Status: AC | PRN
  Administered 2024-02-13: 80 mL via INTRAVENOUS

## 2024-02-15 ENCOUNTER — Telehealth (HOSPITAL_COMMUNITY): Payer: Self-pay

## 2024-02-15 NOTE — Telephone Encounter (Signed)
 Attempted to reach patient to discuss upcoming procedure, no answer. Left VM for patient to return call.

## 2024-02-16 DIAGNOSIS — H6123 Impacted cerumen, bilateral: Secondary | ICD-10-CM | POA: Diagnosis not present

## 2024-02-16 DIAGNOSIS — Z974 Presence of external hearing-aid: Secondary | ICD-10-CM | POA: Diagnosis not present

## 2024-02-26 ENCOUNTER — Other Ambulatory Visit: Payer: Self-pay | Admitting: Internal Medicine

## 2024-02-29 DIAGNOSIS — M545 Low back pain, unspecified: Secondary | ICD-10-CM | POA: Diagnosis not present

## 2024-02-29 DIAGNOSIS — M542 Cervicalgia: Secondary | ICD-10-CM | POA: Diagnosis not present

## 2024-03-06 ENCOUNTER — Other Ambulatory Visit (HOSPITAL_COMMUNITY): Payer: Self-pay

## 2024-03-07 ENCOUNTER — Other Ambulatory Visit

## 2024-03-07 DIAGNOSIS — E039 Hypothyroidism, unspecified: Secondary | ICD-10-CM

## 2024-03-07 DIAGNOSIS — E782 Mixed hyperlipidemia: Secondary | ICD-10-CM

## 2024-03-07 LAB — TSH: TSH: 2.14 u[IU]/mL (ref 0.35–5.50)

## 2024-03-07 LAB — COMPREHENSIVE METABOLIC PANEL WITH GFR
ALT: 20 U/L (ref 0–53)
AST: 31 U/L (ref 0–37)
Albumin: 4.4 g/dL (ref 3.5–5.2)
Alkaline Phosphatase: 49 U/L (ref 39–117)
BUN: 15 mg/dL (ref 6–23)
CO2: 28 meq/L (ref 19–32)
Calcium: 9.3 mg/dL (ref 8.4–10.5)
Chloride: 108 meq/L (ref 96–112)
Creatinine, Ser: 0.96 mg/dL (ref 0.40–1.50)
GFR: 76.5 mL/min (ref >60.00)
Glucose, Bld: 92 mg/dL (ref 70–99)
Potassium: 4.5 meq/L (ref 3.5–5.1)
Sodium: 142 meq/L (ref 135–145)
Total Bilirubin: 0.6 mg/dL (ref 0.2–1.2)
Total Protein: 7.2 g/dL (ref 6.0–8.3)

## 2024-03-07 LAB — LIPID PANEL
Cholesterol: 116 mg/dL (ref 0–200)
HDL: 74.3 mg/dL (ref >39.00)
LDL Cholesterol: 27 mg/dL (ref 0–99)
NonHDL: 41.54
Total CHOL/HDL Ratio: 2
Triglycerides: 72 mg/dL (ref 0.0–149.0)
VLDL: 14.4 mg/dL (ref 0.0–40.0)

## 2024-03-09 ENCOUNTER — Other Ambulatory Visit: Payer: Self-pay | Admitting: Medical Genetics

## 2024-03-11 NOTE — Pre-Procedure Instructions (Signed)
 Instructed patient on the following items: Arrival time 0800 Nothing to eat or drink after midnight No meds AM of procedure Responsible person to drive you home and stay with you for 24 hrs  Have you missed any doses of anti-coagulant Eliquis- takes twice a day, hasn't missed any doses in last 4 weeks.  Don't take dose morning of procedure.

## 2024-03-12 ENCOUNTER — Ambulatory Visit (HOSPITAL_COMMUNITY)
Admission: RE | Admit: 2024-03-12 | Discharge: 2024-03-12 | Disposition: A | Discharge status: Home / Self Care | Attending: Cardiovascular Disease | Admitting: Cardiovascular Disease

## 2024-03-12 ENCOUNTER — Ambulatory Visit (HOSPITAL_COMMUNITY): Admitting: Certified Registered Nurse Anesthetist

## 2024-03-12 ENCOUNTER — Ambulatory Visit (HOSPITAL_COMMUNITY)
Admission: RE | Disposition: A | Discharge status: Home / Self Care | Payer: Self-pay | Source: Home / Self Care | Attending: Cardiovascular Disease

## 2024-03-12 ENCOUNTER — Encounter (HOSPITAL_COMMUNITY): Payer: Self-pay | Admitting: Cardiovascular Disease

## 2024-03-12 ENCOUNTER — Other Ambulatory Visit: Payer: Self-pay

## 2024-03-12 DIAGNOSIS — I10 Essential (primary) hypertension: Secondary | ICD-10-CM | POA: Diagnosis not present

## 2024-03-12 DIAGNOSIS — Z87891 Personal history of nicotine dependence: Secondary | ICD-10-CM | POA: Diagnosis not present

## 2024-03-12 DIAGNOSIS — I484 Atypical atrial flutter: Secondary | ICD-10-CM | POA: Diagnosis not present

## 2024-03-12 DIAGNOSIS — I2510 Atherosclerotic heart disease of native coronary artery without angina pectoris: Secondary | ICD-10-CM

## 2024-03-12 DIAGNOSIS — I4819 Other persistent atrial fibrillation: Secondary | ICD-10-CM | POA: Insufficient documentation

## 2024-03-12 DIAGNOSIS — Z951 Presence of aortocoronary bypass graft: Secondary | ICD-10-CM | POA: Diagnosis not present

## 2024-03-12 DIAGNOSIS — I4891 Unspecified atrial fibrillation: Secondary | ICD-10-CM | POA: Diagnosis not present

## 2024-03-12 DIAGNOSIS — I252 Old myocardial infarction: Secondary | ICD-10-CM | POA: Insufficient documentation

## 2024-03-12 DIAGNOSIS — I493 Ventricular premature depolarization: Secondary | ICD-10-CM | POA: Diagnosis not present

## 2024-03-12 DIAGNOSIS — D6869 Other thrombophilia: Secondary | ICD-10-CM | POA: Insufficient documentation

## 2024-03-12 DIAGNOSIS — Z953 Presence of xenogenic heart valve: Secondary | ICD-10-CM | POA: Diagnosis not present

## 2024-03-12 HISTORY — PX: A-FLUTTER ABLATION: EP1230

## 2024-03-12 HISTORY — PX: ATRIAL FIBRILLATION ABLATION: EP1191

## 2024-03-12 LAB — POCT ACTIVATED CLOTTING TIME: Activated Clotting Time: 273 s

## 2024-03-12 SURGERY — ATRIAL FIBRILLATION ABLATION
Anesthesia: General

## 2024-03-12 MED ORDER — PROPOFOL 10 MG/ML IV BOLUS
INTRAVENOUS | Status: DC | PRN
  Administered 2024-03-12: 140 mg via INTRAVENOUS

## 2024-03-12 MED ORDER — HEPARIN SODIUM (PORCINE) 1000 UNIT/ML IJ SOLN
INTRAMUSCULAR | Status: DC | PRN
  Administered 2024-03-12: 1000 [IU] via INTRAVENOUS

## 2024-03-12 MED ORDER — PROTAMINE SULFATE 10 MG/ML IV SOLN
INTRAVENOUS | Status: DC | PRN
Start: 2024-03-12 — End: 2024-03-12
  Administered 2024-03-12: 10 mg via INTRAVENOUS
  Administered 2024-03-12: 30 mg via INTRAVENOUS

## 2024-03-12 MED ORDER — ROCURONIUM BROMIDE 100 MG/10ML IV SOLN
INTRAVENOUS | Status: DC | PRN
  Administered 2024-03-12 (×2): 10 mg via INTRAVENOUS
  Administered 2024-03-12: 60 mg via INTRAVENOUS

## 2024-03-12 MED ORDER — HEPARIN SODIUM (PORCINE) 1000 UNIT/ML IJ SOLN
INTRAMUSCULAR | Status: AC
Start: 2024-03-12 — End: 2024-03-12
  Filled 2024-03-12: qty 10

## 2024-03-12 MED ORDER — SODIUM CHLORIDE 0.9 % IV SOLN
INTRAVENOUS | Status: DC
Start: 2024-03-12 — End: 2024-03-12

## 2024-03-12 MED ORDER — ACETAMINOPHEN 325 MG PO TABS: 650.0000 mg | ORAL_TABLET | ORAL | Status: DC | PRN

## 2024-03-12 MED ORDER — HEPARIN SODIUM (PORCINE) 1000 UNIT/ML IJ SOLN
INTRAMUSCULAR | Status: AC
  Filled 2024-03-12: qty 10

## 2024-03-12 MED ORDER — HEPARIN (PORCINE) IN NACL 1000-0.9 UT/500ML-% IV SOLN
INTRAVENOUS | Status: DC | PRN
Start: 2024-03-12 — End: 2024-03-12
  Administered 2024-03-12 (×2): 500 mL

## 2024-03-12 MED ORDER — FENTANYL CITRATE (PF) 100 MCG/2ML IJ SOLN
INTRAMUSCULAR | Status: AC
  Filled 2024-03-12: qty 2

## 2024-03-12 MED ORDER — SODIUM CHLORIDE 0.9 % IV SOLN: 250.0000 mL | INTRAVENOUS | Status: DC | PRN

## 2024-03-12 MED ORDER — SUGAMMADEX SODIUM 200 MG/2ML IV SOLN
INTRAVENOUS | Status: DC | PRN
Start: 2024-03-12 — End: 2024-03-12
  Administered 2024-03-12: 200 mg via INTRAVENOUS

## 2024-03-12 MED ORDER — PHENYLEPHRINE HCL-NACL 20-0.9 MG/250ML-% IV SOLN
INTRAVENOUS | Status: DC | PRN
  Administered 2024-03-12: 160 ug via INTRAVENOUS
  Administered 2024-03-12: 25 ug/min via INTRAVENOUS
  Administered 2024-03-12: 80 ug via INTRAVENOUS

## 2024-03-12 MED ORDER — ISOPROTERENOL HCL 0.2 MG/ML IJ SOLN
INTRAMUSCULAR | Status: AC
Start: 2024-03-12 — End: 2024-03-12
  Filled 2024-03-12: qty 5

## 2024-03-12 MED ORDER — ONDANSETRON HCL 4 MG/2ML IJ SOLN: 4.0000 mg | Freq: Four times a day (QID) | INTRAMUSCULAR | Status: DC | PRN

## 2024-03-12 MED ORDER — ONDANSETRON HCL 4 MG/2ML IJ SOLN
INTRAMUSCULAR | Status: DC | PRN
  Administered 2024-03-12: 4 mg via INTRAVENOUS

## 2024-03-12 MED ORDER — HEPARIN SODIUM (PORCINE) 1000 UNIT/ML IJ SOLN
INTRAMUSCULAR | Status: DC | PRN
  Administered 2024-03-12: 10000 [IU] via INTRAVENOUS

## 2024-03-12 MED ORDER — ISOPROTERENOL HCL 0.2 MG/ML IJ SOLN
INTRAVENOUS | Status: DC | PRN
  Administered 2024-03-12: 2 ug/min via INTRAVENOUS

## 2024-03-12 MED ORDER — ATROPINE SULFATE 1 MG/10ML IJ SOSY
PREFILLED_SYRINGE | INTRAMUSCULAR | Status: AC
  Filled 2024-03-12: qty 10

## 2024-03-12 MED ORDER — FENTANYL CITRATE (PF) 100 MCG/2ML IJ SOLN
INTRAMUSCULAR | Status: DC | PRN
  Administered 2024-03-12: 100 ug via INTRAVENOUS

## 2024-03-12 MED ORDER — SODIUM CHLORIDE 0.9% FLUSH
3.0000 mL | INTRAVENOUS | Status: DC | PRN
Start: 2024-03-12 — End: 2024-03-12

## 2024-03-12 MED ORDER — SODIUM CHLORIDE 0.9% FLUSH: 3.0000 mL | Freq: Two times a day (BID) | INTRAVENOUS | Status: DC

## 2024-03-12 SURGICAL SUPPLY — 17 items
CATH ABLAT QDOT MICRO BI TC DF (CATHETERS) IMPLANT
CATH GE 8FR SOUNDSTAR (CATHETERS) IMPLANT
CATH OCTARAY 2.0 F 3-3-3-3-3 (CATHETERS) IMPLANT
CATH WEBSTER BI DIR CS D-F CRV (CATHETERS) IMPLANT
CLOSURE PERCLOSE PROSTYLE (Vascular Products) IMPLANT
COVER SWIFTLINK CONNECTOR (BAG) ×2 IMPLANT
DILATOR VESSEL 38 20CM 16FR (INTRODUCER) IMPLANT
GUIDEWIRE INQWIRE 1.5J.035X260 (WIRE) IMPLANT
KIT CATH VERSACROSS STEERABLE (CATHETERS) IMPLANT
KIT VERSACROSS CNCT FARADRIVE (KITS) IMPLANT
PACK EP LF (CUSTOM PROCEDURE TRAY) ×2 IMPLANT
PAD DEFIB RADIO PHYSIO CONN (PAD) ×2 IMPLANT
PATCH CARTO3 (PAD) IMPLANT
SHEATH PINNACLE 8F 10CM (SHEATH) IMPLANT
SHEATH PINNACLE 9F 10CM (SHEATH) IMPLANT
SHEATH PROBE COVER 6X72 (BAG) IMPLANT
TUBING SMART ABLATE COOLFLOW (TUBING) IMPLANT

## 2024-03-12 NOTE — Anesthesia Procedure Notes (Signed)
 Procedure Name: Intubation Date/Time: 03/12/2024 10:18 AM  Performed by: Harrold Macintosh, CRNAPre-anesthesia Checklist: Patient identified, Emergency Drugs available, Suction available and Patient being monitored Patient Re-evaluated:Patient Re-evaluated prior to induction Oxygen Delivery Method: Circle system utilized Preoxygenation: Pre-oxygenation with 100% oxygen Induction Type: IV induction Ventilation: Mask ventilation without difficulty Laryngoscope Size: Miller and 2 Grade View: Grade I Tube type: Oral Tube size: 7.5 mm Number of attempts: 1 Airway Equipment and Method: Stylet and Oral airway Placement Confirmation: ETT inserted through vocal cords under direct vision, positive ETCO2 and breath sounds checked- equal and bilateral Secured at: 23 cm Tube secured with: Tape Dental Injury: Teeth and Oropharynx as per pre-operative assessment

## 2024-03-12 NOTE — Progress Notes (Signed)
 Presumed care at 1655, pt dressing were clean dry and intact, no s/s of complications at incision site. Discharge instruction reviewed with patient and Ronal wife at the bedside. PT ambulated in the hallway. PT able to tolerate PO intake void without difficulty. PT escorted from the unit via wheel chair to persona vehicle.

## 2024-03-12 NOTE — Progress Notes (Signed)
 Up and walked and tolerated being up; bleeding noted left groin and pressure held x and no further bleeding noted at present and back to bed

## 2024-03-12 NOTE — Discharge Instructions (Signed)

## 2024-03-12 NOTE — Progress Notes (Signed)
Report to Jill RN

## 2024-03-12 NOTE — Transfer of Care (Signed)
 Immediate Anesthesia Transfer of Care Note  Patient: Jeremy Waters  Procedure(s) Performed: ATRIAL FIBRILLATION ABLATION A-FLUTTER ABLATION  Patient Location: Cath Lab  Anesthesia Type:General  Level of Consciousness: awake, alert , and oriented  Airway & Oxygen Therapy: Patient Spontanous Breathing and Patient connected to nasal cannula oxygen  Post-op Assessment: Report given to RN, Post -op Vital signs reviewed and stable, Patient moving all extremities X 4, and Patient able to stick tongue midline  Post vital signs: Reviewed and stable  Last Vitals:  Vitals Value Taken Time  BP 147/78 03/12/24 12:03  Temp 98.2   Pulse 80 03/12/24 12:06  Resp 13 03/12/24 12:06  SpO2 93 % 03/12/24 12:06  Vitals shown include unfiled device data.  Last Pain: There were no vitals filed for this visit.       Complications: There were no known notable events for this encounter.

## 2024-03-12 NOTE — Anesthesia Preprocedure Evaluation (Addendum)
 Anesthesia Evaluation  Patient identified by MRN, date of birth, ID band Patient awake    Reviewed: Allergy & Precautions, NPO status , Patient's Chart, lab work & pertinent test results  Airway Mallampati: II  TM Distance: >3 FB Neck ROM: Full    Dental no notable dental hx.    Pulmonary former smoker   Pulmonary exam normal        Cardiovascular hypertension, Pt. on medications and Pt. on home beta blockers + CAD, + Past MI, + CABG (2024) and + DOE   Rhythm:Irregular Rate:Normal  ECHO 2024:  1. Left ventricular ejection fraction, by estimation, is 50 to 55%. The  left ventricle has low normal function. Left ventricular diastolic  parameters were normal.   2. Right ventricular systolic function is mildly reduced. The right  ventricular size is normal. There is normal pulmonary artery systolic  pressure.   3. Left atrial size was mildly dilated.   4. The mitral valve is normal in structure. Mild mitral valve  regurgitation.   5. S/p AVR (23 mm Edwards Inspiris (March 2024). Difficult to visualize  Peak and mean gradients through the valve are 18 and 10 mm Hg respectively  . The aortic valve has been repaired/replaced. Aortic valve regurgitation  is not visualized. There is a 23   mm Edwards Inspiris valve present in the aortic position. Procedure Date:  08/18/22.   6. The inferior vena cava is normal in size with greater than 50%  respiratory variability, suggesting right atrial pressure of 3 mmHg.      Neuro/Psych negative neurological ROS  negative psych ROS   GI/Hepatic Neg liver ROS,GERD  ,,  Endo/Other  Hypothyroidism    Renal/GU negative Renal ROS  negative genitourinary   Musculoskeletal  (+) Arthritis , Osteoarthritis,    Abdominal Normal abdominal exam  (+)   Peds  Hematology Lab Results      Component                Value               Date                      WBC                      5.3                  02/13/2024                HGB                      13.7                02/13/2024                HCT                      40.9                02/13/2024                MCV                      99 (H)              02/13/2024                PLT  230                 02/13/2024             Lab Results      Component                Value               Date                      NA                       142                 03/07/2024                K                        4.5                 03/07/2024                CO2                      28                  03/07/2024                GLUCOSE                  92                  03/07/2024                BUN                      15                  03/07/2024                CREATININE               0.96                03/07/2024                CALCIUM                   9.3                 03/07/2024                GFR                      76.50               03/07/2024                EGFR                     84                  02/13/2024                GFRNONAA                 >60  11/09/2023              Anesthesia Other Findings   Reproductive/Obstetrics                              Anesthesia Physical Anesthesia Plan  ASA: 3  Anesthesia Plan: General   Post-op Pain Management:    Induction: Intravenous  PONV Risk Score and Plan: 2 and Ondansetron , Dexamethasone  and Treatment may vary due to age or medical condition  Airway Management Planned: Mask and Oral ETT  Additional Equipment: None  Intra-op Plan:   Post-operative Plan: Extubation in OR  Informed Consent: I have reviewed the patients History and Physical, chart, labs and discussed the procedure including the risks, benefits and alternatives for the proposed anesthesia with the patient or authorized representative who has indicated his/her understanding and acceptance.     Dental advisory  given  Plan Discussed with: CRNA  Anesthesia Plan Comments:          Anesthesia Quick Evaluation

## 2024-03-12 NOTE — H&P (Signed)
 Electrophysiology Office Note:    Date:  03/12/2024   ID:  Jeremy Waters, DOB 1946-02-19, MRN [id number]  PCP:  [name] PARAS, MD   [facility] Providers Cardiologist:  Alvan Ronal BRAVO, MD (Inactive)     Referring MD: No ref. provider found   History of Present Illness:    Jeremy Waters is a 78 y.o. male with a medical history significant for a history of paroxysmal atrial fibrillation ,CABG and surgical AVR with MAZE procedure and left atrial appendage ligation, referred for AF management.      Discussed the use of AI scribe software for clinical note transcription with the patient, who gave verbal consent to proceed.  History of Present Illness Jeremy Waters is a 78 year old male with atrial fibrillation who presents for evaluation of recurrent arrhythmia. He was referred by Dr. Bruna Bright to discuss ways to manage his atrial fibrillation.  In June 2021, he first noted atrial fibrillation on his Apple Watch, accompanied by fatigue. Despite heart rates reaching up to 150 beats per minute, he did not experience any symptoms at that time. Prior to his admission in June, he had three weeks of increasing fatigue and exercise intolerance, impacting his ability to maintain his usual 40-minute exercise routine.  In March 2024, he underwent bypass aortic valve replacement and left atrial appendage occlusion. A CT scan confirmed the occlusion. Initially, he was on Eliquis , which was discontinued after a satisfactory scan in December confirming appendage closure.  He has a history of secondary hypercoagulable state and a CHA2DS2-VASc score of 3.   Recently, he traveled to [place], engaging in a physically demanding activity, walking 90 miles with significant elevation changes, and also went on a river cruise from [place] to [place].         Today, he reports he is doing well and has no acute complaints.  He is at baseline. I reviewed the patient's CT and labs. There  was no LAA thrombus. he  has not missed any doses of anticoagulation, and he took his dose last night. There have been no changes in the patient's diagnoses, medications, or condition since our recent clinic visit.   EKGs/Labs/Other Studies Reviewed Today:     Echocardiogram:  TTE Sep 30, 2022 LVEF 50 to 55%.  Left atrial size mildly dilated.  Status post aortic valve replacement.   Monitors:  7 day monitor June 2025-- my interpretation Sinus rhythm 48 to 99 bpm, average 65 bpm 3.18% PVC burden, rare supraventricular ectopy 8% burden of atrial arrhythmia, sometimes associated with symptoms.  Rates often not well-controlled. Symptom episodes also related to PVCs    Advanced imaging:  Cardiac CT June 11, 2023 Severe left atrial enlargement.  Moderate right atrial enlargement.  aneurysmal atrial septum.  Atricure appendage clip in good position with occlusion and no leakage.  23 mm bioprosthetic AVR.    EKG:         Physical Exam:    VS:  BP (!) 168/79   Pulse 62   Temp 97.8 F (36.6 C)   Ht 5' 7 (1.702 m)   Wt 68 kg   SpO2 99%   BMI 23.49 kg/m     Wt Readings from Last 3 Encounters:  03/12/24 68 kg  12/15/23 68 kg  12/15/23 68.3 kg     GEN: Well nourished, well developed in no acute distress CARDIAC: RRR, no murmurs, rubs, gallops RESPIRATORY:  Normal work of breathing MUSCULOSKELETAL: no edema  ASSESSMENT & PLAN:     Atypical atrial flutter Persistent atrial fibrillation Status post MAZE procedure With recurrence of atrial fibrillation and atypical flutter Mild symptoms, some fatigue We discussed management options including medical therapy versus EP study, mapping of flutter and ablation.  Using a shared decision making approach he opted to pursue ablation.  We discussed the indication, rationale, logistics, anticipated benefits, and potential risks of the ablation procedure including but not limited to -- bleed at the groin access site, chest  pain, damage to nearby organs such as the diaphragm, lungs, or esophagus, need for a drainage tube, or prolonged hospitalization. I explained that the risk for stroke, heart attack, need for open chest surgery, or even death is very low but not zero. he  expressed understanding and wishes to proceed.    Secondary hypercoagulable state CHA2DS2-VASc score is 3 Status post left atrial appendage occlusion CT confirmed occluded appendage  PVCs Burden < 5%  S/p surgical AVR     Signed, Eulas FORBES Furbish, MD  03/12/2024 9:40 AM    [facility]

## 2024-03-13 ENCOUNTER — Telehealth (HOSPITAL_COMMUNITY): Payer: Self-pay

## 2024-03-13 MED FILL — Heparin Sodium (Porcine) Inj 1000 Unit/ML: INTRAMUSCULAR | Qty: 10 | Status: AC

## 2024-03-13 MED FILL — Atropine Sulfate Soln Prefill Syr 1 MG/10ML (0.1 MG/ML): INTRAMUSCULAR | Qty: 10 | Status: AC

## 2024-03-13 NOTE — Telephone Encounter (Signed)
 Spoke with patient to complete post procedure follow up call.  Patient reports no complications with groin sites.   Instructions reviewed with patient:  Remove large bandage at puncture site after 24 hours. It is normal to have bruising, tenderness, mild swelling, and a pea or marble sized lump/knot at the groin site which can take up to three months to resolve.  Get help right away if you notice sudden swelling at the puncture site.  Check your puncture site every day for signs of infection: fever, redness, swelling, pus drainage, warmth, foul odor or excessive pain. If this occurs, please call [phone], to speak with the RN Navigator. Get help right away if your puncture site is bleeding and the bleeding does not stop after applying firm pressure to the area.  You may continue to have skipped beats/ atrial fibrillation during the first several months after your procedure.  It is very important not to miss any doses of your blood thinner Eliquis .    You will follow up with the Afib clinic 4 weeks after your procedure and follow up with Dr. Dr. Nancey 3 months after your procedure.   Patient verbalized understanding to all instructions provided.

## 2024-03-13 NOTE — Anesthesia Postprocedure Evaluation (Signed)
 Anesthesia Post Note  Patient: Jeremy Waters  Procedure(s) Performed: ATRIAL FIBRILLATION ABLATION A-FLUTTER ABLATION     Patient location during evaluation: PACU Anesthesia Type: General Level of consciousness: awake and alert Pain management: pain level controlled Vital Signs Assessment: post-procedure vital signs reviewed and stable Respiratory status: spontaneous breathing, nonlabored ventilation, respiratory function stable and patient connected to nasal cannula oxygen Cardiovascular status: blood pressure returned to baseline and stable Postop Assessment: no apparent nausea or vomiting Anesthetic complications: no   There were no known notable events for this encounter.  Last Vitals:  Vitals:   03/12/24 1430 03/12/24 1500  BP: 110/64 113/64  Pulse: 61 62  Resp: 12 12  Temp:    SpO2: 95% 96%    Last Pain:  Vitals:   03/12/24 1235  TempSrc: Oral  PainSc:                  Cordella SQUIBB Ching Rabideau

## 2024-03-15 ENCOUNTER — Ambulatory Visit (INDEPENDENT_AMBULATORY_CARE_PROVIDER_SITE_OTHER)

## 2024-03-15 ENCOUNTER — Encounter: Payer: Self-pay | Admitting: Physician Assistant

## 2024-03-15 VITALS — Ht 67.0 in | Wt 150.0 lb

## 2024-03-15 DIAGNOSIS — Z1211 Encounter for screening for malignant neoplasm of colon: Secondary | ICD-10-CM

## 2024-03-15 DIAGNOSIS — Z0000 Encounter for general adult medical examination without abnormal findings: Secondary | ICD-10-CM

## 2024-03-15 NOTE — Progress Notes (Signed)
 Subjective:   Jeremy Waters is a 78 y.o. who presents for a Medicare Wellness preventive visit.  As a reminder, Annual Wellness Visits don't include a physical exam, and some assessments may be limited, especially if this visit is performed virtually. We may recommend an in-person follow-up visit with your provider if needed.  Visit Complete: Virtual I connected with  Jeremy Waters on 03/15/24 by a audio enabled telemedicine application and verified that I am speaking with the correct person using two identifiers.  Patient Location: Home  Provider Location: Home Office  I discussed the limitations of evaluation and management by telemedicine. The patient expressed understanding and agreed to proceed.  Vital Signs: Because this visit was a virtual/telehealth visit, some criteria may be missing or patient reported. Any vitals not documented were not able to be obtained and vitals that have been documented are patient reported.  VideoDeclined- This patient declined Librarian, academic. Therefore the visit was completed with audio only.  Persons Participating in Visit: Patient.  AWV Questionnaire: Yes: Patient Medicare AWV questionnaire was completed by the patient on 03/11/2024; I have confirmed that all information answered by patient is correct and no changes since this date.  Cardiac Risk Factors include: advanced age (>60men, >20 women);dyslipidemia;male gender;Other (see comment), Risk factor comments: A-Fib, HFpEF     Objective:    Today's Vitals   03/15/24 0815  Weight: 150 lb (68 kg)  Height: 5' 7 (1.702 m)   Body mass index is 23.49 kg/m.     03/15/2024    8:19 AM 03/12/2024    8:24 AM 11/08/2023    3:37 PM 03/08/2023    8:25 AM 10/02/2022    1:54 AM 10/01/2022    9:15 PM 09/26/2022   10:36 AM  Advanced Directives  Does Patient Have a Medical Advance Directive? Yes Yes No Yes Yes Yes No  Type of Estate agent of  Jeremy Waters;Living will   Healthcare Power of Jeremy Waters;Living will Living will Living will   Does patient want to make changes to medical advance directive?     No - Patient declined  No - Patient declined  Copy of Healthcare Power of Attorney in Chart? No - copy requested   No - copy requested No - copy requested    Would patient like information on creating a medical advance directive?   No - Patient declined  No - Patient declined  No - Patient declined    Current Medications (verified) Outpatient Encounter Medications as of 03/15/2024  Medication Sig   apixaban  (ELIQUIS ) 5 MG TABS tablet Take 1 tablet (5 mg total) by mouth 2 (two) times daily. Please begin medication on 02/13/2024 which is 4 weeks prior to your ablation.   Ascorbic Acid  (VITAMIN C ) 1000 MG tablet Take 1,000 mg by mouth 2 (two) times daily.   beta carotene  25000 UNIT capsule Take 25,000 Units by mouth daily.   clonazePAM  (KLONOPIN ) 0.5 MG tablet Take 1 tablet (0.5 mg total) by mouth at bedtime as needed for anxiety. (Patient taking differently: Take 0.25 mg by mouth at bedtime as needed for anxiety (Sleep).)   clopidogrel  (PLAVIX ) 75 MG tablet Take 1 tablet (75 mg total) by mouth daily.   Evolocumab  (REPATHA  SURECLICK) 140 MG/ML SOAJ Inject 140 mg into the skin every 14 (fourteen) days.   fluticasone  (FLONASE ) 50 MCG/ACT nasal spray SPRAY 1 SPRAY IN EACH NOSTRIL ONCE DAILY   levothyroxine  (SYNTHROID ) 50 MCG tablet Take 1 tablet (50 mcg  total) by mouth daily.   metoprolol  succinate (TOPROL  XL) 25 MG 24 hr tablet Take 1 tablet (25 mg total) by mouth at bedtime.   pyridOXINE  (VITAMIN B-6) 100 MG tablet Take 100 mg by mouth daily.   rosuvastatin  (CRESTOR ) 40 MG tablet TAKE 1 TABLET BY MOUTH DAILY   triamcinolone  cream (KENALOG) 0.1 % Apply 1 Application topically daily as needed (Skin rash on legs).   vitamin E 400 UNIT capsule Take 400 Units by mouth daily.   No facility-administered encounter medications on file as of 03/15/2024.     Allergies (verified) Atorvastatin   History: Past Medical History:  Diagnosis Date   Adenomatous colon polyp 09/2007   Arthritis    OA / PAIN LEFT KNEE   Atrial fibrillation (HCC) 08/25/2022   Atrial flutter (HCC) 10/02/2022   Cancer (HCC)    prostate cancer   Cataract    removed both eyes   Coronary artery disease    COVID-19 virus infection    Diverticulosis of colon    w/o hemorrage   Heart murmur    TOLD HE HAS A SLIGHT MURMUR   Hyperlipidemia    Inguinal hernia    left side    Thyroid  disease    Past Surgical History:  Procedure Laterality Date   A-FLUTTER ABLATION N/A 03/12/2024   Procedure: A-FLUTTER ABLATION;  Surgeon: Nancey Eulas BRAVO, MD;  Location: [facility];  Service: Cardiovascular;  Laterality: N/A;   AORTIC VALVE REPLACEMENT N/A 08/18/2022   Procedure: AORTIC VALVE REPLACEMENT (AVR) USING EDWARDS INSPIRIS AORTIC VALVE SIZE ;  Surgeon: Murriel Toribio DEL, MD;  Location: [facility];  Service: Open Heart Surgery;  Laterality: N/A;   ATRIAL FIBRILLATION ABLATION N/A 03/12/2024   Procedure: ATRIAL FIBRILLATION ABLATION;  Surgeon: Nancey Eulas BRAVO, MD;  [facility]: [facility];  Service: Cardiovascular;  Laterality: N/A;   BACK SURGERY  05/12/2005   L5   CARDIAC CATHETERIZATION     CLIPPING OF ATRIAL APPENDAGE N/A 08/18/2022   Procedure: CLIPPING OF ATRIAL APPENDAGE USING ATRICURE 45 ATRICLIP;  Surgeon: Murriel Toribio DEL, MD;  Location: [facility] OR;  Service: Open Heart Surgery;  Laterality: N/A;  Median Sternotomy   COLONOSCOPY  1999   Negative; Dr Aneita   COLONOSCOPY  2004   tics, hemorrhoids   COLONOSCOPY  2009   polyps (T.A.)   CORONARY ARTERY BYPASS GRAFT N/A 08/18/2022   Procedure: CORONARY ARTERY BYPASS GRAFTING (CABG) X2 USING LEFT INTERNAL MAMMARY ARTERY AND LEFT ENDOSCOPICALLY HARVESTED GREATER SAPHENOUS VEIN.;  Surgeon: Murriel Toribio DEL, MD;  Location: [facility];  Service: Open Heart Surgery;  Laterality: N/A;   HERNIA REPAIR  1987   HERNIA  REPAIR  10/2003   KNEE SURGERY  1969   Patellar fracture fragment Lt   LAMINECTOMY  2000   L4-5   LEFT HEART CATH AND CORONARY ANGIOGRAPHY N/A 08/16/2022   Procedure: LEFT HEART CATH AND CORONARY ANGIOGRAPHY;  Surgeon: Wendel Lurena POUR, MD;  [facility];  Service: Cardiovascular;  Laterality: N/A;   LUMBAR LAMINECTOMY/DECOMPRESSION MICRODISCECTOMY Right 10/08/2015   Procedure: MICRO LUMBAR DECOMPRESSION L4-L5 AND L5-S1 ON RIGHT ;  Surgeon: Reyes Billing, MD;  Location: [facility];  Service: Orthopedics;  Laterality: Right;   MASS EXCISION  04/26/2011   Procedure: MINOR EXCISION OF MASS;  Surgeon: Krystal CHRISTELLA Spinner, MD;  Location: [facility];  Service: General;  Laterality: Right;  Excise subcutaneous mass right axilla Minor Room   MAZE N/A 08/18/2022   Procedure:  MAZE USING ATRICURE EARNEY BEAGLE;  Surgeon: Murriel Toribio DEL, MD;  Location: [facility];  Service: Open Heart Surgery;  Laterality: N/A;   POLYPECTOMY     PROSTATECTOMY  04/16/2005   TEE WITHOUT CARDIOVERSION N/A 08/18/2022   Procedure: TRANSESOPHAGEAL ECHOCARDIOGRAM;  Surgeon: Murriel Toribio DEL, MD;  Location: [facility] OR;  Service: Open Heart Surgery;  Laterality: N/A;   TONSILLECTOMY     TOTAL KNEE ARTHROPLASTY Left 07/03/2013   Procedure: LEFT TOTAL KNEE ARTHROPLASTY;  Surgeon: Donnice JONETTA Car, MD;  Location: [facility];  Service: Orthopedics;  Laterality: Left;   Family History  Problem Relation Age of Onset   Lung cancer Mother    COPD Mother    Cancer Father        Bladder Cancer   Prostate cancer Father    Colon cancer Father 51   Urolithiasis Father    Lung cancer Sister    Alcohol abuse Paternal Uncle    Stroke Paternal Uncle 48   Hypertension Paternal Uncle    Asthma Neg Hx    Heart disease Neg Hx    Esophageal cancer Neg Hx    Stomach cancer Neg Hx    Liver disease Neg Hx    Colon polyps Neg Hx    Rectal cancer Neg Hx    Social History   Socioeconomic History   Marital status: Married    Spouse  name: Mary   Number of children: 2   Years of education: 16   Highest education level: Bachelor's degree (e.g., BA, AB, BS)  Occupational History   Occupation: insur exe    Employer: [employer]   Occupation: Retired  Tobacco Use   Smoking status: Former    Current packs/day: 0.00    Types: Cigarettes    Quit date: 1983    Years since quitting: 42.8   Smokeless tobacco: Never   Tobacco comments:    stopped smoking cigarettes 1983, 1 cigar /day 2001-2013  Vaping Use   Vaping status: Never Used  Substance and Sexual Activity   Alcohol use: Yes    Alcohol/week: 7.0 - 14.0 standard drinks of alcohol    Types: 7 - 14 Standard drinks or equivalent per week    Comment: scotch or wine daily   Drug use: No   Sexual activity: Yes  Other Topics Concern   Not on file  Social History Narrative   Exercises daily.  Lives with wife./2025  Has one puppy.            Social Drivers of Corporate investment banker Strain: Low Risk  (03/11/2024)   Overall Financial Resource Strain (CARDIA)    Difficulty of Paying Living Expenses: Not hard at all  Food Insecurity: No Food Insecurity (03/11/2024)   Hunger Vital Sign    Worried About Running Out of Food in the Last Year: Never true    Ran Out of Food in the Last Year: Never true  Transportation Needs: No Transportation Needs (03/11/2024)   PRAPARE - Administrator, Civil Service (Medical): No    Lack of Transportation (Non-Medical): No  Physical Activity: Sufficiently Active (03/11/2024)   Exercise Vital Sign    Days of Exercise per Week: 5 days    Minutes of Exercise per Session: 60 min  Stress: No Stress Concern Present (03/11/2024)   [employer] of Occupational Health - Occupational Stress Questionnaire    Feeling of Stress: Not at all  Social Connections: Socially Integrated (03/11/2024)   Social  Connection and Isolation Panel    Frequency of Communication with Friends and Family: More than three times a  week    Frequency of Social Gatherings with Friends and Family: Three times a week    Attends Religious Services: More than 4 times per year    Active Member of Clubs or Organizations: Yes    Attends Banker Meetings: More than 4 times per year    Marital Status: Married    Tobacco Counseling Counseling given: Not Answered Tobacco comments: stopped smoking cigarettes 1983, 1 cigar /day 2001-2013    Clinical Intake:  Pre-visit preparation completed: Yes  Pain : No/denies pain     BMI - recorded: 23.49 Nutritional Status: BMI of 19-24  Normal Nutritional Risks: None Diabetes: No  Lab Results  Component Value Date   HGBA1C 5.4 03/17/2023   HGBA1C 5.4 08/16/2022   HGBA1C 5.7 05/04/2022     How often do you need to have someone help you when you read instructions, pamphlets, or other written materials from your doctor or pharmacy?: 1 - Never  Interpreter Needed?: No  Information entered by :: Miking Usrey, RMA   Activities of Daily Living     03/11/2024    8:48 AM  In your present state of health, do you have any difficulty performing the following activities:  Hearing? 1  Comment Wears hearing aides  Vision? 0  Difficulty concentrating or making decisions? 0  Walking or climbing stairs? 0  Dressing or bathing? 0  Doing errands, shopping? 0  Preparing Food and eating ? N  Using the Toilet? N  In the past six months, have you accidently leaked urine? Y  Do you have problems with loss of bowel control? N  Managing your Medications? N  Managing your Finances? N  Housekeeping or managing your Housekeeping? N    Patient Care Team: [name] PARAS, MD as PCP - General (Internal Medicine) Alvan Ronal BRAVO, MD (Inactive) as PCP - Cardiology (Cardiology) Jinx Grate, AUD (Audiology) Aneita Gwendlyn DASEN, MD (Inactive) as Consulting Physician (Gastroenterology) Camillo Golas, MD as Consulting Physician (Ophthalmology) Szabat, Toribio BROCKS, [facility] (Inactive)  (Pharmacist)  I have updated your Care Teams any recent Medical Services you may have received from other providers in the past year.     Assessment:   This is a routine wellness examination for Alias.  Hearing/Vision screen Hearing Screening - Comments:: Wears hearing aides Vision Screening - Comments:: Denies vision issues./ [facility]/per pt-up to date    Goals Addressed               This Visit's Progress     Patient Stated (pt-stated)   On track     Want to stay healthy.       Depression Screen     03/15/2024    8:20 AM 11/01/2023    8:37 AM 03/08/2023    8:27 AM 12/20/2022    3:51 PM 12/20/2022    9:56 AM 09/21/2022   11:28 AM 09/06/2022   10:49 AM  PHQ 2/9 Scores  PHQ - 2 Score 0 0 0 0 0 0 0  PHQ- 9 Score 0  0 3 0 4     Fall Risk     03/11/2024    8:48 AM 11/01/2023    8:37 AM 03/08/2023    8:25 AM 12/20/2022    7:00 AM 11/29/2022    6:52 AM  Fall Risk   Falls in the past year? 0 0 1 0  0  Number falls in past yr: 0 0 0 0 0  Injury with Fall? 0 0 0 0 0  Risk for fall due to :  No Fall Risks No Fall Risks Impaired balance/gait Impaired balance/gait  Follow up Falls evaluation completed;Falls prevention discussed Falls evaluation completed Falls prevention discussed;Falls evaluation completed Falls evaluation completed Falls evaluation completed    MEDICARE RISK AT HOME:  Medicare Risk at Home Any stairs in or around the home?: (Patient-Rptd) No If so, are there any without handrails?: (Patient-Rptd) No Home free of loose throw rugs in walkways, pet beds, electrical cords, etc?: (Patient-Rptd) Yes Adequate lighting in your home to reduce risk of falls?: (Patient-Rptd) Yes Life alert?: (Patient-Rptd) No Use of a cane, walker or w/c?: (Patient-Rptd) No Grab bars in the bathroom?: (Patient-Rptd) Yes Shower chair or bench in shower?: (Patient-Rptd) No Elevated toilet seat or a handicapped toilet?: (Patient-Rptd) No  TIMED UP AND GO:  Was the test  performed?  No  Cognitive Function: Declined/Normal: No cognitive concerns noted by patient or family. Patient alert, oriented, able to answer questions appropriately and recall recent events. No signs of memory loss or confusion.    10/11/2017    4:37 PM  MMSE - Mini Mental State Exam  Orientation to time 5  Orientation to Place 5  Registration 3  Attention/ Calculation 5  Recall 3  Language- name 2 objects 2  Language- repeat 1  Language- follow 3 step command 3  Language- read & follow direction 1  Write a sentence 1  Copy design 1  Total score 30        03/08/2023    8:25 AM 03/18/2022    9:17 AM  6CIT Screen  What Year? 0 points 0 points  What month? 0 points 0 points  What time? 0 points 0 points  Count back from 20 0 points 0 points  Months in reverse 0 points 0 points  Repeat phrase 0 points 0 points  Total Score 0 points 0 points    Immunizations Immunization History  Administered Date(s) Administered   Fluad Quad(high Dose 65+) 01/17/2019, 02/11/2020, 02/11/2021, 02/11/2022, 01/31/2023   Hepatitis A 10/15/1997, 12/04/1997   Hepatitis B 09/09/2008, 10/15/2008, 08/01/2017   INFLUENZA, HIGH DOSE SEASONAL PF 02/09/2016, 03/03/2017, 02/23/2018   Influenza Split 03/16/2011, 03/03/2012   Influenza,inj,Quad PF,6+ Mos 03/08/2014, 02/27/2015   Influenza-Unspecified 03/07/2013   Meningococcal Conjugate 09/09/2008   PFIZER(Purple Top)SARS-COV-2 Vaccination 06/18/2019, 07/09/2019, 02/29/2020, 08/29/2020   Pfizer Covid-19 Vaccine Bivalent Booster 25yrs & up 04/17/2021, 02/11/2022   Pneumococcal Conjugate-13 11/20/2014   Pneumococcal Polysaccharide-23 06/07/2013   Tdap 05/24/2008, 08/01/2017   Typhoid Inactivated 09/04/1997, 07/30/2004, 08/01/2017   Unspecified SARS-COV-2 Vaccination 02/24/2023   Yellow Fever 09/09/2008   Zoster Recombinant(Shingrix) 09/21/2016, 03/03/2017   Zoster, Live 03/18/2011    Screening Tests Health Maintenance  Topic Date Due    Influenza Vaccine  12/23/2023   COVID-19 Vaccine (8 - 2025-26 season) 01/23/2024   Colonoscopy  04/22/2024   Medicare Annual Wellness (AWV)  03/15/2025   DTaP/Tdap/Td (3 - Td or Tdap) 08/02/2027   Pneumococcal Vaccine: 50+ Years  Completed   Hepatitis C Screening  Completed   Zoster Vaccines- Shingrix  Completed   Meningococcal B Vaccine  Aged Out   Hepatitis B Vaccines 19-59 Average Risk  Discontinued    Health Maintenance Items Addressed: Referral sent to GI for colonoscopy, See Nurse Notes at the end of this note  Additional Screening:  Vision Screening: Recommended annual ophthalmology exams for early  detection of glaucoma and other disorders of the eye. Is the patient up to date with their annual eye exam?  Yes  Who is the provider or what is the name of the office in which the patient attends annual eye exams? [facility]/per pt-up to date.  Dental Screening: Recommended annual dental exams for proper oral hygiene  Community Resource Referral / Chronic Care Management: CRR required this visit?  No   CCM required this visit?  No   Plan:    I have personally reviewed and noted the following in the patient's chart:   Medical and social history Use of alcohol, tobacco or illicit drugs  Current medications and supplements including opioid prescriptions. Patient is not currently taking opioid prescriptions. Functional ability and status Nutritional status Physical activity Advanced directives List of other physicians Hospitalizations, surgeries, and ER visits in previous 12 months Vitals Screenings to include cognitive, depression, and falls Referrals and appointments  In addition, I have reviewed and discussed with patient certain preventive protocols, quality metrics, and best practice recommendations. A written personalized care plan for preventive services as well as general preventive health recommendations were provided to patient.   Nicole Defino L Nikolaus Pienta,  CMA   03/15/2024   After Visit Summary: (MyChart) Due to this being a telephonic visit, the after visit summary with patients personalized plan was offered to patient via MyChart   Notes: Patient is due for a colonoscopy and order has been placed.  He had no other concerns to address today.

## 2024-03-15 NOTE — Patient Instructions (Signed)
 [name],  Thank you for taking the time for your Medicare Wellness Visit. I appreciate your continued commitment to your health goals. Please review the care plan we discussed, and feel free to reach out if I can assist you further.  Medicare recommends these wellness visits once per year to help you and your care team stay ahead of potential health issues. These visits are designed to focus on prevention, allowing your provider to concentrate on managing your acute and chronic conditions during your regular appointments.  Please note that Annual Wellness Visits do not include a physical exam. Some assessments may be limited, especially if the visit was conducted virtually. If needed, we may recommend a separate in-person follow-up with your provider.  Ongoing Care Seeing your primary care provider every 3 to 6 months helps us  monitor your health and provide consistent, personalized care. Next office visit 03/21/2024.    Referrals If a referral was made during today's visit and you haven't received any updates within two weeks, please contact the referred provider directly to check on the status.  Please call [facility], to get scheduled for a colonoscopy at  Located in: [facility] [address] Address: [address], [place], [place] [address] Phone: [phone] Recommended Screenings:  Health Maintenance  Topic Date Due   Flu Shot  12/23/2023   COVID-19 Vaccine (8 - 2025-26 season) 01/23/2024   Colon Cancer Screening  04/22/2024   Medicare Annual Wellness Visit  03/15/2025   DTaP/Tdap/Td vaccine (3 - Td or Tdap) 08/02/2027   Pneumococcal Vaccine for age over 63  Completed   Hepatitis C Screening  Completed   Zoster (Shingles) Vaccine  Completed   Meningitis B Vaccine  Aged Out   Hepatitis B Vaccine  Discontinued       03/15/2024    8:19 AM  Advanced Directives  Does Patient Have a Medical Advance Directive? Yes  Type of  Estate agent of [place];Living will  Copy of Healthcare Power of Attorney in Chart? No - copy requested   Advance Care Planning is important because it: Ensures you receive medical care that aligns with your values, goals, and preferences. Provides guidance to your family and loved ones, reducing the emotional burden of decision-making during critical moments.  Vision: Annual vision screenings are recommended for early detection of glaucoma, cataracts, and diabetic retinopathy. These exams can also reveal signs of chronic conditions such as diabetes and high blood pressure.  Dental: Annual dental screenings help detect early signs of oral cancer, gum disease, and other conditions linked to overall health, including heart disease and diabetes.  Please see the attached documents for additional preventive care recommendations.

## 2024-03-19 ENCOUNTER — Other Ambulatory Visit: Payer: Self-pay | Admitting: Internal Medicine

## 2024-03-20 ENCOUNTER — Encounter: Payer: Self-pay | Admitting: Internal Medicine

## 2024-03-20 NOTE — Progress Notes (Unsigned)
 Subjective:    Patient ID: Jeremy Waters, male    DOB: 1945/07/28, 78 y.o.   MRN: [id number]     HPI Jeremy Waters is here for a physical exam and his chronic medical problems.  S/p afib ablation last week.    He has been out of breath since the ablation. He has not gotten back to regular exercise  the SOB is with exertion, but he did say he noticed when going for short walk it improved the further he walks.  He will have the shortness of breath at rest at times.  He finds if he leans back the shortness of breath improves.  He does feel tired since the procedure.    Past two months increased gas.  No change in diet.  No carbonated drinks. Took abx prior to dental appt. He has tried adjusting his diet without any improvement-may have made it worse.  Wakes up drooling sometimes-only occurs at night..        Medications and allergies reviewed with patient and updated if appropriate.  Current Outpatient Medications on File Prior to Visit  Medication Sig Dispense Refill   apixaban  (ELIQUIS ) 5 MG TABS tablet Take 1 tablet (5 mg total) by mouth 2 (two) times daily. Please begin medication on 02/13/2024 which is 4 weeks prior to your ablation. 60 tablet 5   Ascorbic Acid  (VITAMIN C ) 1000 MG tablet Take 1,000 mg by mouth 2 (two) times daily.     beta carotene  25000 UNIT capsule Take 25,000 Units by mouth daily.     clopidogrel  (PLAVIX ) 75 MG tablet Take 1 tablet (75 mg total) by mouth daily. 30 tablet 12   Evolocumab  (REPATHA  SURECLICK) 140 MG/ML SOAJ Inject 140 mg into the skin every 14 (fourteen) days. 6 mL 3   fluticasone  (FLONASE ) 50 MCG/ACT nasal spray SPRAY 1 SPRAY IN EACH NOSTRIL ONCE DAILY 16 mL 3   levothyroxine  (SYNTHROID ) 50 MCG tablet TAKE 1 TABLET BY MOUTH DAILY 90 tablet 2   metoprolol  succinate (TOPROL  XL) 25 MG 24 hr tablet Take 1 tablet (25 mg total) by mouth at bedtime. 90 tablet 3   pyridOXINE  (VITAMIN B-6) 100 MG tablet Take 100 mg by mouth daily.     rosuvastatin   (CRESTOR ) 40 MG tablet TAKE 1 TABLET BY MOUTH DAILY 90 tablet 1   triamcinolone  cream (KENALOG) 0.1 % Apply 1 Application topically daily as needed (Skin rash on legs).     vitamin E 400 UNIT capsule Take 400 Units by mouth daily.     No current facility-administered medications on file prior to visit.    Review of Systems  Constitutional:  Positive for fatigue. Negative for fever.  HENT:  Positive for voice change.   Eyes:  Negative for visual disturbance.  Respiratory:  Positive for cough (a little cough - from anesthesia - has gotten better) and shortness of breath. Negative for wheezing.   Cardiovascular:  Positive for chest pain (soreness at times). Negative for palpitations and leg swelling.  Gastrointestinal:  Negative for abdominal pain, constipation and diarrhea.       Occ GERD  Genitourinary:  Negative for difficulty urinating and dysuria.  Musculoskeletal:  Positive for arthralgias and back pain.  Skin:  Negative for rash.  Neurological:  Positive for numbness (occ numbness in feet - usually when laying down). Negative for light-headedness and headaches.  Psychiatric/Behavioral:  Negative for dysphoric mood. The patient is not nervous/anxious.        Objective:   Vitals:  03/21/24 0753  BP: 106/78  Pulse: 78  Temp: 98.1 F (36.7 C)  SpO2: 98%   Filed Weights   03/21/24 0753  Weight: 154 lb (69.9 kg)   Body mass index is 24.12 kg/m.  BP Readings from Last 3 Encounters:  03/21/24 138/78  03/21/24 106/78  03/12/24 113/64    Wt Readings from Last 3 Encounters:  03/21/24 157 lb (71.2 kg)  03/21/24 154 lb (69.9 kg)  03/15/24 150 lb (68 kg)      Physical Exam Constitutional: He appears well-developed and well-nourished. No distress.  HENT:  Head: Normocephalic and atraumatic.  Right Ear: External ear normal.  Left Ear: External ear normal.  Normal ear canals and TM b/l  Mouth/Throat: Oropharynx is clear and moist. Eyes: Conjunctivae and EOM are  normal.  Neck: Neck supple. No tracheal deviation present. No thyromegaly present.  No carotid bruit  Cardiovascular: Normal rate, regular rhythm, normal heart sounds and intact distal pulses.   No murmur heard.  No lower extremity edema. Pulmonary/Chest: Effort normal and breath sounds normal. No respiratory distress. He has no wheezes. He has no rales.  Abdominal: Soft. He exhibits no distension. There is no tenderness.  Genitourinary: deferred  Lymphadenopathy:   He has no cervical adenopathy.  Skin: Skin is warm and dry. He is not diaphoretic.  Psychiatric: He has a normal mood and affect. His behavior is normal.         Assessment & Plan:   Physical exam: Screening blood work  ordered Exercise   regular except for recently he has not been exercising as much since the ablation. Weight  normal Substance abuse   none   Reviewed recommended immunizations.   Health Maintenance  Topic Date Due   Colonoscopy  04/22/2024   COVID-19 Vaccine (9 - 2025-26 season) 08/13/2024   Medicare Annual Wellness (AWV)  03/15/2025   DTaP/Tdap/Td (3 - Td or Tdap) 08/02/2027   Pneumococcal Vaccine: 50+ Years  Completed   Influenza Vaccine  Completed   Hepatitis C Screening  Completed   Zoster Vaccines- Shingrix  Completed   Meningococcal B Vaccine  Aged Out   Hepatitis B Vaccines 19-59 Average Risk  Discontinued     See Problem List for Assessment and Plan of chronic medical problems.

## 2024-03-20 NOTE — Patient Instructions (Addendum)
 Blood work was ordered.       Medications changes include :   None    A referral was ordered and someone will call you to schedule an appointment.     Return in about 1 year (around 03/21/2025) for Physical Exam.     Health Maintenance, Male Adopting a healthy lifestyle and getting preventive care are important in promoting health and wellness. Ask your health care provider about: The right schedule for you to have regular tests and exams. Things you can do on your own to prevent diseases and keep yourself healthy. What should I know about diet, weight, and exercise? Eat a healthy diet  Eat a diet that includes plenty of vegetables, fruits, low-fat dairy products, and lean protein. Do not eat a lot of foods that are high in solid fats, added sugars, or sodium. Maintain a healthy weight Body mass index (BMI) is a measurement that can be used to identify possible weight problems. It estimates body fat based on height and weight. Your health care provider can help determine your BMI and help you achieve or maintain a healthy weight. Get regular exercise Get regular exercise. This is one of the most important things you can do for your health. Most adults should: Exercise for at least 150 minutes each week. The exercise should increase your heart rate and make you sweat (moderate-intensity exercise). Do strengthening exercises at least twice a week. This is in addition to the moderate-intensity exercise. Spend less time sitting. Even light physical activity can be beneficial. Watch cholesterol and blood lipids Have your blood tested for lipids and cholesterol at 77 years of age, then have this test every 5 years. You may need to have your cholesterol levels checked more often if: Your lipid or cholesterol levels are high. You are older than 77 years of age. You are at high risk for heart disease. What should I know about cancer screening? Many types of cancers can be  detected early and may often be prevented. Depending on your health history and family history, you may need to have cancer screening at various ages. This may include screening for: Colorectal cancer. Prostate cancer. Skin cancer. Lung cancer. What should I know about heart disease, diabetes, and high blood pressure? Blood pressure and heart disease High blood pressure causes heart disease and increases the risk of stroke. This is more likely to develop in people who have high blood pressure readings or are overweight. Talk with your health care provider about your target blood pressure readings. Have your blood pressure checked: Every 3-5 years if you are 55-34 years of age. Every year if you are 89 years old or older. If you are between the ages of 54 and 13 and are a current or former smoker, ask your health care provider if you should have a one-time screening for abdominal aortic aneurysm (AAA). Diabetes Have regular diabetes screenings. This checks your fasting blood sugar level. Have the screening done: Once every three years after age 47 if you are at a normal weight and have a low risk for diabetes. More often and at a younger age if you are overweight or have a high risk for diabetes. What should I know about preventing infection? Hepatitis B If you have a higher risk for hepatitis B, you should be screened for this virus. Talk with your health care provider to find out if you are at risk for hepatitis B infection. Hepatitis C Blood testing is  recommended for: Everyone born from [birth year] through 1964. Anyone with known risk factors for hepatitis C. Sexually transmitted infections (STIs) You should be screened each year for STIs, including gonorrhea and chlamydia, if: You are sexually active and are younger than 77 years of age. You are older than 77 years of age and your health care provider tells you that you are at risk for this type of infection. Your sexual activity has changed  since you were last screened, and you are at increased risk for chlamydia or gonorrhea. Ask your health care provider if you are at risk. Ask your health care provider about whether you are at high risk for HIV. Your health care provider may recommend a prescription medicine to help prevent HIV infection. If you choose to take medicine to prevent HIV, you should first get tested for HIV. You should then be tested every 3 months for as long as you are taking the medicine. Follow these instructions at home: Alcohol use Do not drink alcohol if your health care provider tells you not to drink. If you drink alcohol: Limit how much you have to 0-2 drinks a day. Know how much alcohol is in your drink. In the [place] one drink equals one 12 oz bottle of beer (355 mL), one 5 oz glass of wine (148 mL), or one 1 oz glass of hard liquor (44 mL). Lifestyle Do not use any products that contain nicotine or tobacco. These products include cigarettes, chewing tobacco, and vaping devices, such as e-cigarettes. If you need help quitting, ask your health care provider. Do not use street drugs. Do not share needles. Ask your health care provider for help if you need support or information about quitting drugs. General instructions Schedule regular health, dental, and eye exams. Stay current with your vaccines. Tell your health care provider if: You often feel depressed. You have ever been abused or do not feel safe at home. Summary Adopting a healthy lifestyle and getting preventive care are important in promoting health and wellness. Follow your health care provider's instructions about healthy diet, exercising, and getting tested or screened for diseases. Follow your health care provider's instructions on monitoring your cholesterol and blood pressure. This information is not intended to replace advice given to you by your health care provider. Make sure you discuss any questions you have with your health care  provider. Document Revised: 09/29/2020 Document Reviewed: 09/29/2020 Elsevier Patient Education  2024 [employer].

## 2024-03-21 ENCOUNTER — Telehealth: Payer: Self-pay | Admitting: Cardiology

## 2024-03-21 ENCOUNTER — Ambulatory Visit: Payer: Self-pay | Admitting: Internal Medicine

## 2024-03-21 ENCOUNTER — Ambulatory Visit (INDEPENDENT_AMBULATORY_CARE_PROVIDER_SITE_OTHER)

## 2024-03-21 ENCOUNTER — Ambulatory Visit: Attending: Cardiology | Admitting: Cardiology

## 2024-03-21 ENCOUNTER — Encounter: Payer: Self-pay | Admitting: Cardiology

## 2024-03-21 ENCOUNTER — Ambulatory Visit (INDEPENDENT_AMBULATORY_CARE_PROVIDER_SITE_OTHER): Payer: PPO | Admitting: Internal Medicine

## 2024-03-21 VITALS — BP 138/78 | HR 68 | Ht 67.0 in | Wt 157.0 lb

## 2024-03-21 VITALS — BP 106/78 | HR 78 | Temp 98.1°F | Ht 67.0 in | Wt 154.0 lb

## 2024-03-21 DIAGNOSIS — E7800 Pure hypercholesterolemia, unspecified: Secondary | ICD-10-CM

## 2024-03-21 DIAGNOSIS — E039 Hypothyroidism, unspecified: Secondary | ICD-10-CM | POA: Diagnosis not present

## 2024-03-21 DIAGNOSIS — I5032 Chronic diastolic (congestive) heart failure: Secondary | ICD-10-CM

## 2024-03-21 DIAGNOSIS — E782 Mixed hyperlipidemia: Secondary | ICD-10-CM | POA: Diagnosis not present

## 2024-03-21 DIAGNOSIS — R059 Cough, unspecified: Secondary | ICD-10-CM | POA: Diagnosis not present

## 2024-03-21 DIAGNOSIS — R0602 Shortness of breath: Secondary | ICD-10-CM | POA: Diagnosis not present

## 2024-03-21 DIAGNOSIS — R0609 Other forms of dyspnea: Secondary | ICD-10-CM

## 2024-03-21 DIAGNOSIS — I2510 Atherosclerotic heart disease of native coronary artery without angina pectoris: Secondary | ICD-10-CM | POA: Diagnosis not present

## 2024-03-21 DIAGNOSIS — G479 Sleep disorder, unspecified: Secondary | ICD-10-CM

## 2024-03-21 DIAGNOSIS — Z0000 Encounter for general adult medical examination without abnormal findings: Secondary | ICD-10-CM | POA: Diagnosis not present

## 2024-03-21 DIAGNOSIS — R5383 Other fatigue: Secondary | ICD-10-CM

## 2024-03-21 DIAGNOSIS — I493 Ventricular premature depolarization: Secondary | ICD-10-CM | POA: Diagnosis not present

## 2024-03-21 DIAGNOSIS — Z95811 Presence of heart assist device: Secondary | ICD-10-CM | POA: Insufficient documentation

## 2024-03-21 MED ORDER — CLONAZEPAM 0.5 MG PO TABS: 0.5000 mg | ORAL_TABLET | Freq: Every evening | ORAL | 2 refills | Status: AC | PRN

## 2024-03-21 NOTE — Progress Notes (Signed)
 Cardiology Office Note:   Date:  03/21/2024  ID:  Jeremy Waters, DOB Oct 15, 1945, MRN [id number] PCP: [name] PARAS, MD  [facility] Providers Cardiologist:  Alvan Ronal BRAVO, MD (Inactive) {  History of Present Illness:   Jeremy Waters is a 78 y.o. male who has previously been seen by Dr. Ronal Alvan.  He has a history of PVCs.  He was seen initially for this.  He had an initial echocardiogram in October 2022 which was essentially unremarkable aside from heavy calcification of his aortic valve.  In March 2024 he was notified by his Apple Watch that he was in A-fib.  He went to the ER where he was found to have A-fib with rapid rate.  Troponins were elevated.  He subsequently had cardiac cath.  EF was 50-55% and he had severe paradoxical low flow-low gradient AS. He had cath that showed ostial and prox LAD of 90%, mid RCA 80% lesion and distal 95%, Distal Lcx was 90%. He had 2V CABG to the SVG-RCA and LIMA-LAD. He is also s/p MAZE and LAA appendage clip. He also underwent AV replacement with 23 mm Edwards Inspiris valve. He had some bradycardia and hypotension, BB was stopped. He was discharged on DAPT and no AC with LAA clip.  He presented  in early May with afib with RVR managed with IV dilt and converted to sinus brady rates 50s. Continue BB however while at home BPs were low and this was stopped. Otherwise, he had no significant issues.    He had recurrent atrial fibrillation and had ablation the other day.  This was on 10/20.  He never really felt his fibrillation previously.  He has not had any chest discomfort ever.  It seems like his fibrillation and subsequently his coronary disease were discovered because of the watch.  He called today to be added to the schedule.  He says that for the last 4 to 5 days he has had increased dyspnea.  He has not been walking the half mile to the dining hall at [facility].  He has not been back to being on the elliptical.  With activities just like  walking to his mailbox he has noticed increased shortness of breath.  He short of breath lying down at night.  He has to get up and he walks around his home and watches TV till going back to sleep.  He has not had any weight gain or edema.  He denies any chest pressure, neck or arm discomfort.  He had a little bit of a cough nonproductive.  He had no fevers or chills.  A chest x-ray was done with his primary provider today and there were no abnormalities.  He has never had symptoms like this before.  ROS: As stated in the HPI and negative for all other systems.  Studies Reviewed:    EKG:   EKG Interpretation Date/Time:  Wednesday March 21 2024 15:34:31 EDT Ventricular Rate:  64 PR Interval:  200 QRS Duration:  110 QT Interval:  408 QTC Calculation: 420 R Axis:   47  Text Interpretation: Normal sinus rhythm Incomplete right bundle branch block When compared with ECG of 12-Mar-2024 12:18, No significant change was found Confirmed by Lavona Agent ([id number]) on 03/21/2024 3:35:59 PM    Risk Assessment/Calculations:    CHA2DS2-VASc Score = 3   This indicates a 3.2% annual risk of stroke. The patient's score is based upon: CHF History: 0 HTN History: 1 Diabetes History: 0  Stroke History: 0 Vascular Disease History: 0 Age Score: 2 Gender Score: 0     Physical Exam:   VS:  BP 138/78   Pulse 68   Ht 5' 7 (1.702 m)   Wt 157 lb (71.2 kg)   BMI 24.59 kg/m    Wt Readings from Last 3 Encounters:  03/21/24 157 lb (71.2 kg)  03/21/24 154 lb (69.9 kg)  03/15/24 150 lb (68 kg)     GEN: Well nourished, well developed in no acute distress NECK: No JVD; No carotid bruits CARDIAC: RRR, 2 out of 6 brief apical systolic murmur nonradiating, no diastolic murmurs, rubs, gallops RESPIRATORY:  Clear to auscultation without rales, wheezing or rhonchi  ABDOMEN: Soft, non-tender, non-distended EXTREMITIES:  No edema; No deformity   ASSESSMENT AND PLAN:   CAD s/p CABG:    The patient is  status post two-vessel bypass in March 2024.   At this point I am not suspecting his shortness of breath to be an anginal equivalent but would consider an ischemia workup if the below testing is negative.  For now he should continue the meds as listed.    Paroxysmal Atrial fibrillation/atypical flutter : He is in sinus rhythm.  He remains on anticoagulation.   Paradoxical Low flow-low gradient severe AS status post AVR:    Follow-up echo in May 2024 demonstrated normal 23 mm Edwards Inspiris AVR.   Gradient was 9.2 mm mean.  I am going to check a BNP and a follow-up echocardiogram.  If this is unremarkable I will consider ischemia workup.  Dyspnea: This will be worked up as above sequentially.  Along with the studies ordered above I will also get BNP CBC, basic metabolic profile and TSH.  Follow up with us  after the above testing.  Signed, Lynwood Schilling, MD

## 2024-03-21 NOTE — Telephone Encounter (Signed)
 Pt c/o Shortness Of Breath: STAT if SOB developed within the last 24 hours or pt is noticeably SOB on the phone  1. Are you currently SOB (can you hear that pt is SOB on the phone)?  He said a little bit right now   2. How long have you been experiencing SOB? He stated he has had it over the last week.    3. Are you SOB when sitting or when up moving around? Both   4. Are you currently experiencing any other symptoms? No other symptoms   Pt had an ablation  on 10/20.   Best number [phone]

## 2024-03-21 NOTE — Telephone Encounter (Signed)
 Spoke to patient - he states he has been short of breath for several days.  He had a Afib ablation on 03/12/24 with Dr Nancey.   Patient is living at [facility].  He states he went to the infirmary  last evening -  oxygen sat was good.    He had an appt with his primary today  for his annual Physical.  Dr Geofm  instructed patient t contact cardiology.   Patient states he did not receive a  EKG today,  Dr Geofm did not say anything was lungs  sound as well   O2 was 98%   Offered  appt  for today with doctor of day  or tomorrow - afib clinic.   Patient is aware to come appointment 03/21/24 at 2:40 pm  Patient verbalized understanding.

## 2024-03-21 NOTE — Assessment & Plan Note (Addendum)
 Chronic Euthyroid TSH in normal range Continue levothyroxine  50 mcg daily

## 2024-03-21 NOTE — Assessment & Plan Note (Signed)
 Acute Has been feeling more fatigued since his atrial fibrillation ablation last week Has also had a little shortness of breath with rest and exertion Has had a little cough which was likely from being intubated This will likely improve with time Encouraged him to slowly start walking more regularly and increase as tolerated

## 2024-03-21 NOTE — Assessment & Plan Note (Signed)
 Chronic Following with cardiology S/p CABG Denies symptoms consistent with angina Plan Plavix  75 mg daily, Eliquis  5 mg twice daily, Repatha  140 mg q. 14 days, Crestor  40 mg daily

## 2024-03-21 NOTE — Assessment & Plan Note (Signed)
 Chronic LDL very well-controlled Continue Crestor  40 mg daily, Repatha  140 mg q. 14 days Check lipid panel

## 2024-03-21 NOTE — Assessment & Plan Note (Signed)
Chronic Controlled, Stable Continue clonazepam 0.5 mg nightly as needed - takes 1/2 pill and only 2-3 times a month - takes it when he is anxious when going to bed

## 2024-03-21 NOTE — Assessment & Plan Note (Signed)
 Since the ablation last week he has been experiencing shortness of breath with exertion and at rest Sometimes at rest it seems to be related to posture and if he straightens up he is less short of breath. Also has it with exertion, but the other day when he went for a walk he noted the more he walked and improved There may be an element of anxiety Chest x-ray today Will follow-up with cardiology Blood work reassuring

## 2024-03-21 NOTE — Patient Instructions (Addendum)
 Medication Instructions:  Your physician recommends that you continue on your current medications as directed. Please refer to the Current Medication list given to you today.  *If you need a refill on your cardiac medications before your next appointment, please call your pharmacy*  Lab Work: BNP, BMET, TSH, CBC today at [facility] If you have labs (blood work) drawn today and your tests are completely normal, you will receive your results only by: MyChart Message (if you have MyChart) OR A paper copy in the mail If you have any lab test that is abnormal or we need to change your treatment, we will call you to review the results.  Testing/Procedures: Echocardiogram in the next 3-4 days Your physician has requested that you have an echocardiogram. Echocardiography is a painless test that uses sound waves to create images of your heart. It provides your doctor with information about the size and shape of your heart and how well your heart's chambers and valves are working. This procedure takes approximately one hour. There are no restrictions for this procedure. Please do NOT wear cologne, perfume, aftershave, or lotions (deodorant is allowed). Please arrive 15 minutes prior to your appointment time.  Please note: We ask at that you not bring children with you during ultrasound (echo/ vascular) testing. Due to room size and safety concerns, children are not allowed in the ultrasound rooms during exams. Our front office staff cannot provide observation of children in our lobby area while testing is being conducted. An adult accompanying a patient to their appointment will only be allowed in the ultrasound room at the discretion of the ultrasound technician under special circumstances. We apologize for any inconvenience.   Follow-Up: At [facility], you and your health needs are our priority.  As part of our continuing mission to provide you with exceptional heart care, our providers are all  part of one team.  This team includes your primary Cardiologist (physician) and Advanced Practice Providers or APPs (Physician Assistants and Nurse Practitioners) who all work together to provide you with the care you need, when you need it.  Your next appointment:   2 weeks  Provider:   APP or Hochrein, MD  We recommend signing up for the patient portal called MyChart.  Sign up information is provided on this After Visit Summary.  MyChart is used to connect with patients for Virtual Visits (Telemedicine).  Patients are able to view lab/test results, encounter notes, upcoming appointments, etc.  Non-urgent messages can be sent to your provider as well.   To learn more about what you can do with MyChart, go to [email or web address].

## 2024-03-21 NOTE — Assessment & Plan Note (Signed)
 Chronic Euvolemic Follows with cardiology

## 2024-03-22 ENCOUNTER — Telehealth: Payer: Self-pay | Admitting: Cardiology

## 2024-03-22 DIAGNOSIS — M545 Low back pain, unspecified: Secondary | ICD-10-CM | POA: Diagnosis not present

## 2024-03-22 DIAGNOSIS — M542 Cervicalgia: Secondary | ICD-10-CM | POA: Diagnosis not present

## 2024-03-22 LAB — CBC
Hematocrit: 44.1 % (ref 37.5–51.0)
Hemoglobin: 14.4 g/dL (ref 13.0–17.7)
MCH: 32.5 pg (ref 26.6–33.0)
MCHC: 32.7 g/dL (ref 31.5–35.7)
MCV: 100 fL — ABNORMAL HIGH (ref 79–97)
Platelets: 284 x10E3/uL (ref 150–450)
RBC: 4.43 x10E6/uL (ref 4.14–5.80)
RDW: 13 % (ref 11.6–15.4)
WBC: 8.9 x10E3/uL (ref 3.4–10.8)

## 2024-03-22 LAB — BASIC METABOLIC PANEL WITH GFR
BUN/Creatinine Ratio: 19 (ref 10–24)
BUN: 18 mg/dL (ref 8–27)
CO2: 24 mmol/L (ref 20–29)
Calcium: 9.5 mg/dL (ref 8.6–10.2)
Chloride: 103 mmol/L (ref 96–106)
Creatinine, Ser: 0.93 mg/dL (ref 0.76–1.27)
Glucose: 86 mg/dL (ref 70–99)
Potassium: 4.2 mmol/L (ref 3.5–5.2)
Sodium: 139 mmol/L (ref 134–144)
eGFR: 85 mL/min/1.73 (ref >59)

## 2024-03-22 LAB — TSH: TSH: 1.83 u[IU]/mL (ref 0.450–4.500)

## 2024-03-22 LAB — PRO B NATRIURETIC PEPTIDE: NT-Pro BNP: 524 pg/mL — AB (ref 0–486)

## 2024-03-22 NOTE — Telephone Encounter (Signed)
 Spoke with pt, aware there are no sooner openings for echo at any location right now.

## 2024-03-22 NOTE — Telephone Encounter (Signed)
 Pt wants to know if he can go somewhere else and have his Echo done sooner. Please advise

## 2024-03-25 ENCOUNTER — Encounter: Payer: Self-pay | Admitting: Cardiology

## 2024-03-25 ENCOUNTER — Ambulatory Visit: Payer: Self-pay | Admitting: Cardiology

## 2024-03-25 DIAGNOSIS — Z79899 Other long term (current) drug therapy: Secondary | ICD-10-CM

## 2024-03-27 ENCOUNTER — Ambulatory Visit (HOSPITAL_COMMUNITY)
Admission: RE | Admit: 2024-03-27 | Discharge: 2024-03-27 | Disposition: A | Discharge status: Home / Self Care | Source: Ambulatory Visit | Attending: Cardiovascular Disease | Admitting: Cardiovascular Disease

## 2024-03-27 DIAGNOSIS — R0602 Shortness of breath: Secondary | ICD-10-CM

## 2024-03-27 LAB — ECHOCARDIOGRAM COMPLETE
AR max vel: 1.28 cm2
AV Area VTI: 1.17 cm2
AV Area mean vel: 1.32 cm2
AV Mean grad: 12 mmHg
AV Peak grad: 23.5 mmHg
Ao pk vel: 2.43 m/s
Area-P 1/2: 3.87 cm2
Est EF: 50
S' Lateral: 3.69 cm

## 2024-03-30 ENCOUNTER — Telehealth: Payer: Self-pay | Admitting: Cardiology

## 2024-03-30 MED ORDER — FUROSEMIDE 20 MG PO TABS: 20.0000 mg | ORAL_TABLET | Freq: Every day | ORAL | 3 refills | Status: DC

## 2024-03-30 NOTE — Telephone Encounter (Signed)
 See results follow up from 11/2

## 2024-03-30 NOTE — Telephone Encounter (Signed)
-----   Message from Lynwood Schilling sent at 03/29/2024 11:55 AM EST ----- The echocardiogram is unchanged from previous.  The ejection fraction is low normal.  The aortic valve looks fine.  Because his BNP was slightly elevated this could be an issue with some volume and I  like him to start taking Lasix  20 mg daily and come back in 1 week for basic metabolic profile.  He mentions that he is going to be seeing Dr. Wonda and can we see if that follow-up appointment can  be arranged and if so cancel the follow-up with me.  Call Jeremy Waters with the results and send results to [name] PARAS, MDs ----- Message ----- From: Interface, [employer] Sent: 03/27/2024   1:32 PM EST To: Lynwood Schilling, MD

## 2024-03-30 NOTE — Telephone Encounter (Signed)
 Pt returning nurses call regarding Echo results. Please advise

## 2024-03-30 NOTE — Telephone Encounter (Signed)
 See result follow up from 11/2

## 2024-03-30 NOTE — Telephone Encounter (Signed)
 Spoke with pt regarding results. Pt agreeable to plan. Lasix  20 mg once daily ordered. Lab ordered and released. Pt verbalized understanding. All questions if any were answered.

## 2024-04-04 ENCOUNTER — Ambulatory Visit: Attending: Cardiovascular Disease | Admitting: Cardiovascular Disease

## 2024-04-04 ENCOUNTER — Encounter: Payer: Self-pay | Admitting: Cardiovascular Disease

## 2024-04-04 VITALS — BP 116/80 | HR 67 | Ht 67.0 in | Wt 155.4 lb

## 2024-04-04 DIAGNOSIS — I480 Paroxysmal atrial fibrillation: Secondary | ICD-10-CM

## 2024-04-04 DIAGNOSIS — Z952 Presence of prosthetic heart valve: Secondary | ICD-10-CM

## 2024-04-04 DIAGNOSIS — E782 Mixed hyperlipidemia: Secondary | ICD-10-CM | POA: Diagnosis not present

## 2024-04-04 DIAGNOSIS — I5032 Chronic diastolic (congestive) heart failure: Secondary | ICD-10-CM

## 2024-04-04 DIAGNOSIS — I2510 Atherosclerotic heart disease of native coronary artery without angina pectoris: Secondary | ICD-10-CM | POA: Diagnosis not present

## 2024-04-04 NOTE — Progress Notes (Signed)
 Pt scheduled to see Dr. Wonda today 04/04/24.

## 2024-04-04 NOTE — Assessment & Plan Note (Signed)
 Normal function of his aortic valve on recent echo.  Mean gradient 12 mmHg.  No paravalvular or intra valvular regurgitation.

## 2024-04-04 NOTE — Assessment & Plan Note (Signed)
 Reviewed medications and with him on oral anticoagulation, advised he can discontinue clopidogrel .  He is greater than 12 months out from CABG.  If anticoagulation is discontinued in the future, he should start on aspirin  81 mg daily.  Appropriately treated with a statin drug and PCSK9 inhibitor.  Appears clinically stable with no angina.

## 2024-04-04 NOTE — Assessment & Plan Note (Signed)
 Advised he can discontinue Lasix .  Will check a metabolic panel to make sure his renal function and electrolytes are okay.  Echo reviewed with LVEF 50%.

## 2024-04-04 NOTE — Patient Instructions (Signed)
 Medication Instructions:  STOP Furosemide  (Lasix )  STOP Clopidogrel  (Plavix )  *If you need a refill on your cardiac medications before your next appointment, please call your pharmacy*  Lab Work: Complete a BMP today  If you have labs (blood work) drawn today and your tests are completely normal, you will receive your results only by: MyChart Message (if you have MyChart) OR A paper copy in the mail If you have any lab test that is abnormal or we need to change your treatment, we will call you to review the results.  Testing/Procedures: None ordered today.  Follow-Up: At [facility], you and your health needs are our priority.  As part of our continuing mission to provide you with exceptional heart care, our providers are all part of one team.  This team includes your primary Cardiologist (physician) and Advanced Practice Providers or APPs (Physician Assistants and Nurse Practitioners) who all work together to provide you with the care you need, when you need it.  Your next appointment:   4 month(s)  Provider:   [name], MD

## 2024-04-04 NOTE — Progress Notes (Signed)
 Cardiology Office Note:    Date:  04/04/2024   ID:  Jeremy Waters, DOB [birth year], MRN [id number]  PCP:  [name] PARAS, MD    HeartCare Providers Cardiologist:  [name], MD     Referring MD: [name] PARAS, MD   Chief Complaint  Patient presents with   Shortness of Breath   History of Present Illness:    Jeremy Waters is a 78 y.o. male presenting for follow-up evaluation.  The patient is been seen by multiple providers in our practice.  He has requested to establish with me as his primary cardiologist.  His past history includes atrial fibrillation, PVCs, and calcification of his aortic valve.  He ultimately presented to the emergency room with atrial fibrillation with RVR and elevated troponin.  Cardiac catheterization demonstrated ostial and proximal LAD stenosis of 90%, RCA stenosis of 80% and 95% in the mid and distal vessel, and distal circumflex stenosis of 90%.  Echo showed findings consistent with severe paradoxical low-flow low gradient aortic stenosis.  The patient ultimately underwent two-vessel CABG with a saphenous vein graft RCA and LIMA to LAD, Maze procedure with left atrial appendage clip, and aortic valve replacement with a 23 mm Edwards Inspiris valve.  The patient then went on to develop recurrent atrial fibrillation and underwent A-fib ablation in October 2020.  He was seen by Dr. Lavona March 21, 2024 because of worsening shortness of breath.  BNP was elevated and he was started on furosemide .  An echo was completed 03/27/2024 and showed LVEF of 50%, by 3D volume LVEF is 54%.  There is grade 2 diastolic dysfunction, mildly reduced RV function, normal PA pressure, trivial mitral regurgitation, and normal function of the bioprosthetic aortic valve with a mean gradient of 12 mmHg and no perivalvular regurgitation.  The patient is here with his wife today.  He really has not appreciated any significant improvement with the use of furosemide .  He has  not had any orthopnea, PND, or leg swelling.  His dyspnea remains unchanged.  He is short of breath with certain activities, but can walk at a slow pace continuously for exercise without trouble.  He has no chest pain or pressure.  He has had an occasional cough.  States that his symptoms have been worse since undergoing A-fib ablation.  Workup reviewed as outlined above.  No other complaints today.   Current Medications: Current Meds  Medication Sig   apixaban  (ELIQUIS ) 5 MG TABS tablet Take 1 tablet (5 mg total) by mouth 2 (two) times daily. Please begin medication on 02/13/2024 which is 4 weeks prior to your ablation.   Ascorbic Acid  (VITAMIN C ) 1000 MG tablet Take 1,000 mg by mouth 2 (two) times daily.   beta carotene  25000 UNIT capsule Take 25,000 Units by mouth daily.   clonazePAM  (KLONOPIN ) 0.5 MG tablet Take 1 tablet (0.5 mg total) by mouth at bedtime as needed for anxiety.   Evolocumab  (REPATHA  SURECLICK) 140 MG/ML SOAJ Inject 140 mg into the skin every 14 (fourteen) days.   fluticasone  (FLONASE ) 50 MCG/ACT nasal spray SPRAY 1 SPRAY IN EACH NOSTRIL ONCE DAILY   levothyroxine  (SYNTHROID ) 50 MCG tablet TAKE 1 TABLET BY MOUTH DAILY   metoprolol  succinate (TOPROL  XL) 25 MG 24 hr tablet Take 1 tablet (25 mg total) by mouth at bedtime.   pyridOXINE  (VITAMIN B-6) 100 MG tablet Take 100 mg by mouth daily.   rosuvastatin  (CRESTOR ) 40 MG tablet TAKE 1 TABLET BY MOUTH DAILY  triamcinolone  cream (KENALOG) 0.1 % Apply 1 Application topically daily as needed (Skin rash on legs).   vitamin E 400 UNIT capsule Take 400 Units by mouth daily.   [DISCONTINUED] clopidogrel  (PLAVIX ) 75 MG tablet Take 1 tablet (75 mg total) by mouth daily.   [DISCONTINUED] furosemide  (LASIX ) 20 MG tablet Take 1 tablet (20 mg total) by mouth daily.     Allergies:   Atorvastatin   ROS:   Please see the history of present illness.    All other systems reviewed and are negative.  EKGs/Labs/Other Studies Reviewed:    The  following studies were reviewed today: Cardiac Studies & Procedures   ______________________________________________________________________________________________ CARDIAC CATHETERIZATION  CARDIAC CATHETERIZATION 08/16/2022  Conclusion   Ost LAD to Prox LAD lesion is 90% stenosed.   Dist RCA lesion is 95% stenosed.   Mid RCA lesion is 80% stenosed.   Dist Cx lesion is 90% stenosed.   Mid LAD lesion is 65% stenosed.  1.  High-grade serial lesions of right coronary artery. 2.  High-grade calcified proximal LAD stenosis; the LAD is a relatively calcified vessel and require long segment stenting peripheral revascularization. 3.  High-grade distal left circumflex disease. 4.  LVEDP of 3 mmHg.  Recommendations: The results were reviewed with Dr. Court, a cardiothoracic surgical consult will be obtained.  Findings Coronary Findings Diagnostic  Dominance: Right  Left Anterior Descending Ost LAD to Prox LAD lesion is 90% stenosed. Mid LAD lesion is 65% stenosed.  Left Circumflex Dist Cx lesion is 90% stenosed.  Right Coronary Artery There is moderate diffuse disease throughout the vessel. Mid RCA lesion is 80% stenosed. Dist RCA lesion is 95% stenosed.  Intervention  No interventions have been documented.   STRESS TESTS  EXERCISE TOLERANCE TEST (ETT) 11/13/2014  Interpretation Summary  There was no ST segment deviation noted during stress.  Excellent exercise capacity. No chest pain. No arm pain. Normal BP response to exercise. No significant ST changes to suggest ischemia. There was increased artifact and an incomplete RBBB.  This did not significant effect interpretation. FU with Elsie Roses  as planned. Bonney Glendia Ferrier, PA-C 11/13/2014 11:34 AM   ECHOCARDIOGRAM  ECHOCARDIOGRAM COMPLETE 03/27/2024  Narrative ECHOCARDIOGRAM REPORT    Patient Name:   Jeremy Waters Date of Exam: 03/27/2024 Medical Rec #:  [id number]       Height:       67.0  in Accession #:    [id number]      Weight:       157.0 lb Date of Birth:  1946/04/27       BSA:          1.825 m Patient Age:    78 years        BP:           138/78 mmHg Patient Gender: M               HR:           73 bpm. Exam Location:  [address]  Procedure: 2D Echo, 3D Echo, Cardiac Doppler and Color Doppler (Both Spectral and Color Flow Doppler were utilized during procedure).  Indications:    R06.02 SOB  History:        Patient has prior history of Echocardiogram examinations, most recent 09/30/2022. CAD, Aortic Valve Disease; Risk Factors:Dyslipidemia and Hx Cancer.  Sonographer:    Powell Saras Referring Phys: [id number] JAMES HOCHREIN  IMPRESSIONS   1. Left ventricular ejection fraction, by estimation, is 50%. Left  ventricular ejection fraction by 3D volume is 54 %. The left ventricle has mildly decreased function. The left ventricle demonstrates global hypokinesis. Left ventricular diastolic parameters are consistent with Grade II diastolic dysfunction (pseudonormalization). 2. Right ventricular systolic function is mildly reduced. The right ventricular size is normal. There is normal pulmonary artery systolic pressure. The estimated right ventricular systolic pressure is 33.7 mmHg. 3. Left atrial size was moderately dilated. 4. Right atrial size was mildly dilated. 5. The mitral valve is normal in structure. Trivial mitral valve regurgitation. No evidence of mitral stenosis. 6. Bioprosthetic aortic valve function appears normal. Mean gradient 12 mmHg. No peri-valvular leakage noted. 7. The inferior vena cava is normal in size with greater than 50% respiratory variability, suggesting right atrial pressure of 3 mmHg.  FINDINGS Left Ventricle: Left ventricular ejection fraction, by estimation, is 50%. Left ventricular ejection fraction by 3D volume is 54 %. The left ventricle has mildly decreased function. The left ventricle demonstrates global hypokinesis. The  left ventricular internal cavity size was normal in size. There is no left ventricular hypertrophy. Left ventricular diastolic parameters are consistent with Grade II diastolic dysfunction (pseudonormalization).  Right Ventricle: The right ventricular size is normal. No increase in right ventricular wall thickness. Right ventricular systolic function is mildly reduced. There is normal pulmonary artery systolic pressure. The tricuspid regurgitant velocity is 2.77 m/s, and with an assumed right atrial pressure of 3 mmHg, the estimated right ventricular systolic pressure is 33.7 mmHg.  Left Atrium: Left atrial size was moderately dilated.  Right Atrium: Right atrial size was mildly dilated.  Pericardium: There is no evidence of pericardial effusion.  Mitral Valve: The mitral valve is normal in structure. Mild mitral annular calcification. Trivial mitral valve regurgitation. No evidence of mitral valve stenosis.  Tricuspid Valve: The tricuspid valve is normal in structure. Tricuspid valve regurgitation is trivial.  Aortic Valve: Bioprosthetic aortic valve function appears normal. Mean gradient 12 mmHg. No peri-valvular leakage noted. Aortic valve mean gradient measures 12.0 mmHg. Aortic valve peak gradient measures 23.5 mmHg. Aortic valve area, by VTI measures 1.17 cm.  Pulmonic Valve: The pulmonic valve was normal in structure. Pulmonic valve regurgitation is mild.  Aorta: The aortic root is normal in size and structure.  Venous: The inferior vena cava is normal in size with greater than 50% respiratory variability, suggesting right atrial pressure of 3 mmHg.  IAS/Shunts: No atrial level shunt detected by color flow Doppler.  Additional Comments: 3D was performed not requiring image post processing on an independent workstation and was abnormal.   LEFT VENTRICLE PLAX 2D LVIDd:         5.17 cm         Diastology LVIDs:         3.69 cm         LV e' medial:    6.47 cm/s LV PW:          0.81 cm         LV E/e' medial:  8.0 LV IVS:        0.76 cm         LV e' lateral:   11.10 cm/s LVOT diam:     2.10 cm         LV E/e' lateral: 4.6 LV SV:         64 LV SV Index:   35 LVOT Area:     3.46 cm        3D Volume EF LV 3D EF:  Left ventricul ar ejection fraction by 3D volume is 54 %.  3D Volume EF: 3D EF:        54 % LV EDV:       133 ml LV ESV:       61 ml LV SV:        72 ml  RIGHT VENTRICLE            IVC RV Basal diam:  4.00 cm    IVC diam: 1.13 cm RV S prime:     9.62 cm/s TAPSE (M-mode): 1.6 cm  LEFT ATRIUM             Index        RIGHT ATRIUM           Index LA diam:        4.80 cm 2.63 cm/m   RA Area:     21.90 cm LA Vol (A2C):   77.2 ml 42.31 ml/m  RA Volume:   62.10 ml  34.04 ml/m LA Vol (A4C):   83.5 ml 45.76 ml/m LA Biplane Vol: 81.8 ml 44.83 ml/m AORTIC VALVE AV Area (Vmax):    1.28 cm AV Area (Vmean):   1.32 cm AV Area (VTI):     1.17 cm AV Vmax:           242.50 cm/s AV Vmean:          162.000 cm/s AV VTI:            0.550 m AV Peak Grad:      23.5 mmHg AV Mean Grad:      12.0 mmHg LVOT Vmax:         89.60 cm/s LVOT Vmean:        61.700 cm/s LVOT VTI:          0.186 m LVOT/AV VTI ratio: 0.34  AORTA Ao Root diam: 3.10 cm Ao Asc diam:  3.70 cm  MITRAL VALVE               TRICUSPID VALVE MV Area (PHT): 3.87 cm    TR Peak grad:   30.7 mmHg MV Decel Time: 196 msec    TR Vmax:        277.00 cm/s MV E velocity: 51.60 cm/s MV A velocity: 42.10 cm/s  SHUNTS MV E/A ratio:  1.23        Systemic VTI:  0.19 m Systemic Diam: 2.10 cm  Dalton McleanMD Electronically signed by Ezra Kanner Signature Date/Time: 03/27/2024/1:32:03 PM    Final   TEE  ECHO INTRAOPERATIVE TEE 08/18/2022  Narrative *INTRAOPERATIVE TRANSESOPHAGEAL REPORT *    Patient Name:   Jeremy Waters Date of Exam: 08/18/2022 Medical Rec #:  [id number]       Height:       67.0 in Accession #:    [id number]      Weight:       144.8 lb Date of Birth:   1945-10-13       BSA:          1.76 m Patient Age:    76 years        BP:           145/72 mmHg Patient Gender: M               HR:           43 bpm. Exam Location:  Anesthesiology  Transesophogeal exam was perform intraoperatively during surgical procedure. Patient was closely  monitored under general anesthesia during the entirety of examination.  Indications:     coronary artery bypass surgery Sonographer:     Tinnie Barefoot RDCS Performing Phys: [id number] DANIEL H ENTER Diagnosing Phys: Lynwood Stabs MD  Complications: No known complications during this procedure. POST-OP IMPRESSIONS _ Aortic Valve: No stenosis present. A bioprosthetic bioprosthetic valve was placed, leaflets are freely mobile Size; 23mm. No regurgitation post repair. The gradient recorded across the prosthetic valve is within the expected range, measuring 66 cm/s. No perivalvular leak noted. _ Comments: Post bypass Aortic valve images demonstrate normal function prosthetic valve without perivalvular leak. LV with improved function.  PRE-OP FINDINGS Left Ventricle: The left ventricle has mildly reduced systolic function, with an ejection fraction of 45-50%. The cavity size was normal. There is no left ventricular hypertrophy.   Right Ventricle: The right ventricle has normal systolic function. The cavity was normal. There is no increase in right ventricular wall thickness.  Left Atrium: Left atrial size was dilated. No left atrial/left atrial appendage thrombus was detected. There is echo contrast seen in the left atrial cavity.  Right Atrium: Right atrial size was normal in size.  Interatrial Septum: No atrial level shunt detected by color flow Doppler.  Pericardium: There is no evidence of pericardial effusion.  Mitral Valve: The mitral valve is normal in structure. Mitral valve regurgitation is mild by color flow Doppler. The MR jet is centrally-directed. There is No evidence of mitral  stenosis.  Tricuspid Valve: The tricuspid valve was normal in structure. Tricuspid valve regurgitation is mild by color flow Doppler. No evidence of tricuspid stenosis is present.  Aortic Valve: The aortic valve is tricuspid Aortic valve regurgitation is mild by color flow Doppler. There is moderate stenosis of the aortic valve. There is severe thickening and severe calcifcation present on the aortic valve right coronary cusp with severely decreased mobility and there is moderate thickening and moderate calcification present on the aortic valve left coronary cusp with severely decreased mobility and there is mild thickening and moderate calcification present on the aortic valve non-coronary cusp with mildly decreased mobility.  Pulmonic Valve: The pulmonic valve was abnormal. Pulmonic valve regurgitation is trivial by color flow Doppler.   Aorta: The aortic root is normal in size and structure. The aortic arch was not well visualized. There is evidence of plaque in the descending aorta; Grade II, measuring 2-37mm in size.   Lynwood Stabs MD Electronically signed by Lynwood Stabs MD Signature Date/Time: 08/18/2022/2:06:36 PM    Final  MONITORS  LONG TERM MONITOR-LIVE TELEMETRY (3-14 DAYS) 12/12/2023  Narrative Zio patch monitor 6 days 11/09/2023 - 11/16/2023: Dominant rhythm: Sinus w/bundle branch block/IVCD. HR 48-99 bpm. Avg HR 65 bpm, in sinus rhythm. Atrial fibrillation/flutter occurred (8% burden), ranging from 59-159 bpm (avg of 97 bpm), the longest lasting 13 hours 10 mins with an avg rate of 97 bpm. Atrial fibrillation/flutter was detected within +/- 45 seconds of symptomatic patient event(s). 5 episodes of atrial tachycardia, fastest and longest at 145 bpm for 6 beats. <1% isolated SVE, couplet/triplets. 1 episode of VT, at 174 bpm for 5 beats. 3.8% isolated VE, <1% couplet/triplets. No high grade AV block, sinus pause >3sec noted. 4 patient triggered events, correlated with  VE/atrial flutter.       ______________________________________________________________________________________________      EKG:        Recent Labs: 11/08/2023: B Natriuretic Peptide 277.9; Magnesium  2.4 03/07/2024: ALT 20 03/21/2024: BUN 18; Creatinine, Ser 0.93; Hemoglobin 14.4; NT-Pro BNP 524; Platelets  284; Potassium 4.2; Sodium 139; TSH 1.830  Recent Lipid Panel    Component Value Date/Time   CHOL 116 03/07/2024 0756   CHOL 109 02/17/2023 0913   CHOL 224 (H) 09/02/2014 0919   TRIG 72.0 03/07/2024 0756   TRIG 116 09/02/2014 0919   HDL 74.30 03/07/2024 0756   HDL 80 02/17/2023 0913   HDL 76 09/02/2014 0919   CHOLHDL 2 03/07/2024 0756   VLDL 14.4 03/07/2024 0756   LDLCALC 27 03/07/2024 0756   LDLCALC 19 02/17/2023 0913   LDLCALC 125 (H) 09/02/2014 0919   LDLDIRECT 179.6 01/04/2013 0740     Risk Assessment/Calculations:    CHA2DS2-VASc Score = 3   This indicates a 3.2% annual risk of stroke. The patient's score is based upon: CHF History: 0 HTN History: 1 Diabetes History: 0 Stroke History: 0 Vascular Disease History: 0 Age Score: 2 Gender Score: 0          STOP-Bang Score:  5       Physical Exam:    VS:  BP 116/80 (BP Location: Left Arm, Patient Position: Sitting, Cuff Size: Normal)   Pulse 67   Ht 5' 7 (1.702 m)   Wt 155 lb 6.4 oz (70.5 kg)   SpO2 96%   BMI 24.34 kg/m     Wt Readings from Last 3 Encounters:  04/04/24 155 lb 6.4 oz (70.5 kg)  03/21/24 157 lb (71.2 kg)  03/21/24 154 lb (69.9 kg)     GEN:  Well nourished, well developed in no acute distress HEENT: Normal NECK: No JVD; No carotid bruits LYMPHATICS: No lymphadenopathy CARDIAC: RRR, no murmurs, rubs, gallops RESPIRATORY:  Clear to auscultation without rales, wheezing or rhonchi  ABDOMEN: Soft, non-tender, non-distended MUSCULOSKELETAL:  No edema; No deformity  SKIN: Warm and dry NEUROLOGIC:  Alert and oriented x 3 PSYCHIATRIC:  Normal affect   Assessment & Plan PAF  (paroxysmal atrial fibrillation) (HCC) Appears to be maintaining sinus rhythm after A-fib ablation.  Encouraged him to continue with his walking program and regular exercise as I expect his shortness of breath will improve over time with conditioning.  We had a lengthy discussion about his need for long-term anticoagulation.  He has undergone surgical left atrial appendage ligation.  I suspect he will need to remain on oral anticoagulation for about 3 months after A-fib ablation.  I am not sure that he will require long-term oral anticoagulation will defer to EP.  Note on his gated cardiac CTA, the appendage has no flow and appears to be completely ligated.  He will continue on his beta-blocker at current dose. S/P aortic valve replacement Normal function of his aortic valve on recent echo.  Mean gradient 12 mmHg.  No paravalvular or intra valvular regurgitation. Mixed hyperlipidemia Treated with rosuvastatin  and evolocumab .  Continue current management. Chronic heart failure with preserved ejection fraction (HFpEF) (HCC) Advised he can discontinue Lasix .  Will check a metabolic panel to make sure his renal function and electrolytes are okay.  Echo reviewed with LVEF 50%. Coronary artery disease involving native coronary artery of native heart without angina pectoris Reviewed medications and with him on oral anticoagulation, advised he can discontinue clopidogrel .  He is greater than 12 months out from CABG.  If anticoagulation is discontinued in the future, he should start on aspirin  81 mg daily.  Appropriately treated with a statin drug and PCSK9 inhibitor.  Appears clinically stable with no angina.    Other recommendations: Follow-up with atrial fibrillation clinic and EP  as scheduled.  I will see him back in 3 to 4 months.  Again, reviewed a graded exercise program as I suspect this will help his exercise tolerance and shortness of breath is much as anything.       Medication Adjustments/Labs  and Tests Ordered: Current medicines are reviewed at length with the patient today.  Concerns regarding medicines are outlined above.  Orders Placed This Encounter  Procedures   EKG 12-Lead   No orders of the defined types were placed in this encounter.   Patient Instructions  Medication Instructions:  STOP Furosemide  (Lasix )  STOP Clopidogrel  (Plavix )  *If you need a refill on your cardiac medications before your next appointment, please call your pharmacy*  Lab Work: Complete a BMP today  If you have labs (blood work) drawn today and your tests are completely normal, you will receive your results only by: MyChart Message (if you have MyChart) OR A paper copy in the mail If you have any lab test that is abnormal or we need to change your treatment, we will call you to review the results.  Testing/Procedures: None ordered today.  Follow-Up: At [facility], you and your health needs are our priority.  As part of our continuing mission to provide you with exceptional heart care, our providers are all part of one team.  This team includes your primary Cardiologist (physician) and Advanced Practice Providers or APPs (Physician Assistants and Nurse Practitioners) who all work together to provide you with the care you need, when you need it.  Your next appointment:   4 month(s)  Provider:   [name], MD      Signed, [name], MD  04/04/2024 3:01 PM    [facility]

## 2024-04-05 LAB — BASIC METABOLIC PANEL WITH GFR
BUN/Creatinine Ratio: 16 (ref 10–24)
BUN: 16 mg/dL (ref 8–27)
CO2: 23 mmol/L (ref 20–29)
Calcium: 9.6 mg/dL (ref 8.6–10.2)
Chloride: 104 mmol/L (ref 96–106)
Creatinine, Ser: 0.99 mg/dL (ref 0.76–1.27)
Glucose: 78 mg/dL (ref 70–99)
Potassium: 3.9 mmol/L (ref 3.5–5.2)
Sodium: 143 mmol/L (ref 134–144)
eGFR: 78 mL/min/1.73 (ref >59)

## 2024-04-07 ENCOUNTER — Encounter: Payer: Self-pay | Admitting: Internal Medicine

## 2024-04-07 DIAGNOSIS — Z8546 Personal history of malignant neoplasm of prostate: Secondary | ICD-10-CM | POA: Diagnosis not present

## 2024-04-07 DIAGNOSIS — Z7901 Long term (current) use of anticoagulants: Secondary | ICD-10-CM | POA: Diagnosis not present

## 2024-04-07 DIAGNOSIS — Z87891 Personal history of nicotine dependence: Secondary | ICD-10-CM | POA: Diagnosis not present

## 2024-04-07 DIAGNOSIS — I959 Hypotension, unspecified: Secondary | ICD-10-CM | POA: Diagnosis not present

## 2024-04-07 DIAGNOSIS — R319 Hematuria, unspecified: Secondary | ICD-10-CM | POA: Diagnosis not present

## 2024-04-07 DIAGNOSIS — Z87442 Personal history of urinary calculi: Secondary | ICD-10-CM | POA: Diagnosis not present

## 2024-04-07 DIAGNOSIS — Z951 Presence of aortocoronary bypass graft: Secondary | ICD-10-CM | POA: Diagnosis not present

## 2024-04-07 DIAGNOSIS — N200 Calculus of kidney: Secondary | ICD-10-CM | POA: Diagnosis not present

## 2024-04-07 DIAGNOSIS — R109 Unspecified abdominal pain: Secondary | ICD-10-CM | POA: Diagnosis not present

## 2024-04-09 ENCOUNTER — Ambulatory Visit (HOSPITAL_COMMUNITY): Admitting: Internal Medicine

## 2024-04-10 ENCOUNTER — Ambulatory Visit (HOSPITAL_COMMUNITY)
Admission: RE | Admit: 2024-04-10 | Discharge: 2024-04-10 | Disposition: A | Discharge status: Home / Self Care | Source: Ambulatory Visit | Attending: Internal Medicine | Admitting: Internal Medicine

## 2024-04-10 ENCOUNTER — Encounter (HOSPITAL_COMMUNITY): Payer: Self-pay | Admitting: Internal Medicine

## 2024-04-10 VITALS — BP 114/72 | HR 72 | Ht 67.0 in | Wt 161.6 lb

## 2024-04-10 DIAGNOSIS — D6869 Other thrombophilia: Secondary | ICD-10-CM

## 2024-04-10 DIAGNOSIS — I480 Paroxysmal atrial fibrillation: Secondary | ICD-10-CM | POA: Diagnosis not present

## 2024-04-10 DIAGNOSIS — I4819 Other persistent atrial fibrillation: Secondary | ICD-10-CM

## 2024-04-10 NOTE — Progress Notes (Signed)
 Primary Care Physician: [name] PARAS, MD Primary Cardiologist: [name], MD Electrophysiologist: None     Referring Physician: Dr. Nancey Lamar [name] is a 78 y.o. male with a history of PVCs, CAD s/p CABG, HFpEF, HLD, s/p AVR, and atrial fibrillation/flutter who presents for consultation in the [facility]. Patient is on Eliquis  for stroke prevention.  On evaluation today, patient is currently in NSR. S/p Afib ablation on 03/12/24 by Dr. Nancey. No episodes of Afib since ablation. No chest pain.  Patient notes some intermittent shortness of breath with activity and fatigue since the ablation but feels like they are improving. Leg sites healed without issue. No missed doses of anticoagulant.  Today, he denies symptoms of PND, lower extremity edema, dizziness, presyncope, syncope, bleeding, or neurologic sequela. The patient is tolerating medications without difficulties and is otherwise without complaint today.    he has a BMI of Body mass index is 25.31 kg/m.SABRA Filed Weights   04/10/24 1319  Weight: 73.3 kg    Current Outpatient Medications  Medication Sig Dispense Refill   apixaban  (ELIQUIS ) 5 MG TABS tablet Take 1 tablet (5 mg total) by mouth 2 (two) times daily. Please begin medication on 02/13/2024 which is 4 weeks prior to your ablation. 60 tablet 5   Ascorbic Acid  (VITAMIN C ) 1000 MG tablet Take 1,000 mg by mouth 2 (two) times daily.     beta carotene  25000 UNIT capsule Take 25,000 Units by mouth daily.     clonazePAM  (KLONOPIN ) 0.5 MG tablet Take 1 tablet (0.5 mg total) by mouth at bedtime as needed for anxiety. 30 tablet 2   Evolocumab  (REPATHA  SURECLICK) 140 MG/ML SOAJ Inject 140 mg into the skin every 14 (fourteen) days. 6 mL 3   fluticasone  (FLONASE ) 50 MCG/ACT nasal spray SPRAY 1 SPRAY IN EACH NOSTRIL ONCE DAILY 16 mL 3   levothyroxine  (SYNTHROID ) 50 MCG tablet TAKE 1 TABLET BY MOUTH DAILY 90 tablet 2   metoprolol  succinate  (TOPROL  XL) 25 MG 24 hr tablet Take 1 tablet (25 mg total) by mouth at bedtime. 90 tablet 3   pyridOXINE  (VITAMIN B-6) 100 MG tablet Take 100 mg by mouth daily.     rosuvastatin  (CRESTOR ) 40 MG tablet TAKE 1 TABLET BY MOUTH DAILY 90 tablet 1   triamcinolone  cream (KENALOG) 0.1 % Apply 1 Application topically daily as needed (Skin rash on legs).     vitamin E 400 UNIT capsule Take 400 Units by mouth daily.     No current facility-administered medications for this encounter.    Atrial Fibrillation Management history:  Previous antiarrhythmic drugs: none Previous cardioversions: none Previous ablations: 03/12/24 Anticoagulation history: Eliquis    ROS- All systems are reviewed and negative except as per the HPI above.  Physical Exam: BP 114/72   Pulse 72   Ht 5' 7 (1.702 m)   Wt 73.3 kg   BMI 25.31 kg/m   GEN: Well nourished, well developed in no acute distress NECK: No JVD; No carotid bruits CARDIAC: Regular rate and rhythm, no murmurs, rubs, gallops RESPIRATORY:  Clear to auscultation without rales, wheezing or rhonchi  ABDOMEN: Soft, non-tender, non-distended EXTREMITIES:  No edema; No deformity   EKG today demonstrates  Vent. rate 72 BPM PR interval 178 ms QRS duration 108 ms QT/QTcB 388/424 ms P-R-T axes 18 23 7  Normal sinus rhythm Incomplete right bundle branch block Borderline ECG When compared with ECG of 21-Mar-2024 15:34, Previous ECG is present  Echo 03/27/24 demonstrated  1. Left ventricular ejection fraction, by estimation, is 50%. Left  ventricular ejection fraction by 3D volume is 54 %. The left ventricle has  mildly decreased function. The left ventricle demonstrates global  hypokinesis. Left ventricular diastolic  parameters are consistent with Grade II diastolic dysfunction  (pseudonormalization).   2. Right ventricular systolic function is mildly reduced. The right  ventricular size is normal. There is normal pulmonary artery systolic  pressure.  The estimated right ventricular systolic pressure is 33.7 mmHg.   3. Left atrial size was moderately dilated.   4. Right atrial size was mildly dilated.   5. The mitral valve is normal in structure. Trivial mitral valve  regurgitation. No evidence of mitral stenosis.   6. Bioprosthetic aortic valve function appears normal. Mean gradient 12  mmHg. No peri-valvular leakage noted.   7. The inferior vena cava is normal in size with greater than 50%  respiratory variability, suggesting right atrial pressure of 3 mmHg.   ASSESSMENT & PLAN CHA2DS2-VASc Score = 4  The patient's score is based upon: CHF History: 0 HTN History: 1 Diabetes History: 0 Stroke History: 0 Vascular Disease History: 1 Age Score: 2 Gender Score: 0       ASSESSMENT AND PLAN: Persistent Atrial Fibrillation (ICD10:  I48.19) The patient's CHA2DS2-VASc score is 4, indicating a 4.8% annual risk of stroke.   S/p Afib/flutter ablation on 03/12/24 by Dr. Nancey.  Patient is currently in NSR. Continue Toprol  25 mg daily.   Secondary Hypercoagulable State (ICD10:  D68.69) The patient is at significant risk for stroke/thromboembolism based upon his CHA2DS2-VASc Score of 4.  Continue Apixaban  (Eliquis ).  Continue Eliquis .     Follow up with EP as scheduled.    [name], [facility]  [facility] [address] [place], [place] [address] [phone]

## 2024-04-12 ENCOUNTER — Ambulatory Visit: Admitting: Cardiology

## 2024-04-16 DIAGNOSIS — M545 Low back pain, unspecified: Secondary | ICD-10-CM | POA: Diagnosis not present

## 2024-04-16 DIAGNOSIS — M542 Cervicalgia: Secondary | ICD-10-CM | POA: Diagnosis not present

## 2024-04-26 DIAGNOSIS — N200 Calculus of kidney: Secondary | ICD-10-CM | POA: Diagnosis not present

## 2024-04-26 DIAGNOSIS — R310 Gross hematuria: Secondary | ICD-10-CM | POA: Diagnosis not present

## 2024-04-26 DIAGNOSIS — Z8546 Personal history of malignant neoplasm of prostate: Secondary | ICD-10-CM | POA: Diagnosis not present

## 2024-05-01 ENCOUNTER — Emergency Department (HOSPITAL_BASED_OUTPATIENT_CLINIC_OR_DEPARTMENT_OTHER)
Admission: EM | Admit: 2024-05-01 | Discharge: 2024-05-01 | Disposition: A | Discharge status: Home / Self Care | Attending: Emergency Medicine | Admitting: Emergency Medicine

## 2024-05-01 ENCOUNTER — Encounter (HOSPITAL_BASED_OUTPATIENT_CLINIC_OR_DEPARTMENT_OTHER): Payer: Self-pay | Admitting: Emergency Medicine

## 2024-05-01 ENCOUNTER — Emergency Department (HOSPITAL_BASED_OUTPATIENT_CLINIC_OR_DEPARTMENT_OTHER): Admitting: Radiology

## 2024-05-01 ENCOUNTER — Other Ambulatory Visit: Payer: Self-pay

## 2024-05-01 DIAGNOSIS — I2510 Atherosclerotic heart disease of native coronary artery without angina pectoris: Secondary | ICD-10-CM | POA: Diagnosis not present

## 2024-05-01 DIAGNOSIS — R051 Acute cough: Secondary | ICD-10-CM

## 2024-05-01 DIAGNOSIS — R059 Cough, unspecified: Secondary | ICD-10-CM | POA: Diagnosis not present

## 2024-05-01 DIAGNOSIS — I482 Chronic atrial fibrillation, unspecified: Secondary | ICD-10-CM | POA: Diagnosis not present

## 2024-05-01 DIAGNOSIS — R0989 Other specified symptoms and signs involving the circulatory and respiratory systems: Secondary | ICD-10-CM | POA: Diagnosis not present

## 2024-05-01 DIAGNOSIS — Z951 Presence of aortocoronary bypass graft: Secondary | ICD-10-CM | POA: Diagnosis not present

## 2024-05-01 DIAGNOSIS — R0602 Shortness of breath: Secondary | ICD-10-CM | POA: Diagnosis not present

## 2024-05-01 LAB — BASIC METABOLIC PANEL WITH GFR
Anion gap: 14 (ref 5–15)
BUN: 18 mg/dL (ref 8–23)
CO2: 21 mmol/L — ABNORMAL LOW (ref 22–32)
Calcium: 9.6 mg/dL (ref 8.9–10.3)
Chloride: 106 mmol/L (ref 98–111)
Creatinine, Ser: 0.96 mg/dL (ref 0.61–1.24)
GFR, Estimated: >60 mL/min (ref >60)
Glucose, Bld: 70 mg/dL (ref 70–99)
Potassium: 4.3 mmol/L (ref 3.5–5.1)
Sodium: 141 mmol/L (ref 135–145)

## 2024-05-01 LAB — CBC
HCT: 40.9 % (ref 39.0–52.0)
Hemoglobin: 13.8 g/dL (ref 13.0–17.0)
MCH: 32.7 pg (ref 26.0–34.0)
MCHC: 33.7 g/dL (ref 30.0–36.0)
MCV: 96.9 fL (ref 80.0–100.0)
Platelets: 220 K/uL (ref 150–400)
RBC: 4.22 MIL/uL (ref 4.22–5.81)
RDW: 13.7 % (ref 11.5–15.5)
WBC: 10.2 K/uL (ref 4.0–10.5)
nRBC: 0 % (ref 0.0–0.2)

## 2024-05-01 LAB — RESP PANEL BY RT-PCR (RSV, FLU A&B, COVID)  RVPGX2
Influenza A by PCR: NEGATIVE
Influenza B by PCR: NEGATIVE
Resp Syncytial Virus by PCR: NEGATIVE
SARS Coronavirus 2 by RT PCR: NEGATIVE

## 2024-05-01 LAB — PRO BRAIN NATRIURETIC PEPTIDE: Pro Brain Natriuretic Peptide: 327 pg/mL — ABNORMAL HIGH (ref <300.0)

## 2024-05-01 LAB — TROPONIN T, HIGH SENSITIVITY: Troponin T High Sensitivity: <15 ng/L (ref 0–19)

## 2024-05-01 MED ORDER — BENZONATATE 100 MG PO CAPS: 100.0000 mg | ORAL_CAPSULE | Freq: Three times a day (TID) | ORAL | 0 refills | Status: DC

## 2024-05-01 MED ORDER — BENZONATATE 100 MG PO CAPS
100.0000 mg | ORAL_CAPSULE | Freq: Once | ORAL | Status: AC
  Administered 2024-05-01: 100 mg via ORAL
  Filled 2024-05-01: qty 1

## 2024-05-01 NOTE — ED Triage Notes (Signed)
 C/o cough and congestion w/ SHOB x 1 week. States symptoms have progressively gotten worse. Denies fevers.

## 2024-05-01 NOTE — Progress Notes (Unsigned)
 Jeremy Console, PA-C [address] [place], [place]  [address] Phone: [phone]   Gastroenterology Consultation  Referring Provider:     [name] PARAS, MD Primary Care Physician:  [name] PARAS, MD Primary Gastroenterologist:  Jeremy Console, PA-C / *** Reason for Consultation:     History of colon polyps, discuss repeat colonoscopy        HPI:   Discussed the use of AI scribe software for clinical note transcription with the patient, who gave verbal consent to proceed.  He has personal history of adenomatous colon polyps and Family history of colon cancer in his father age 61.  He is due for 3-year repeat surveillance colonoscopy.  [facility] 05/01/2024 to evaluate shortness of breath.  He had normal chest x-ray, unremarkable EKG with normal sinus rhythm, no signs of CHF or acute coronary syndrome.  He was able to ambulate in the ED and maintain O2 saturation above 94% without any difficulty.  No evidence of COPD.  Normal CBC with hemoglobin 13.8, WBC 10.2.  Normal BMP.  Discharged home in stable condition and advised to follow-up with cardiology. History of Present Illness   03/2021 last colonoscopy by Dr. Aneita: 12 mm sessile tubular adenoma polyp removed from mid transverse colon.  2 other (6 mm to 7 mm) tubular adenoma polyps removed from transverse colon.  Mild diverticulosis.  Small internal hemorrhoids.  Good prep.  Torturous colon.  3-year repeat colonoscopy (due 03/2024).  PMH: A-flutter and A-fib s/p ablation 03/12/2024, CAD s/p CABG 07/2022, aortic stenosis s/p aortic valve replacement 07/2022.  Currently on Eliquis .  Echo 11//25 showed LVEF 50%.  Patient recently saw cardiologist Dr. Wonda 04/04/2024 in follow-up and was maintaining sinus rhythm post ablation.  He has undergone surgical left atrial appendage ligation.  It was recommended he remain on anticoagulation 3 months post A-fib ablation.  Past Medical History:  Diagnosis Date    Adenomatous colon polyp 09/2007   Arthritis    OA / PAIN LEFT KNEE   Atrial fibrillation (HCC) 08/25/2022   Atrial flutter (HCC) 10/02/2022   Cancer (HCC)    prostate cancer   Cataract    removed both eyes   Coronary artery disease    COVID-19 virus infection    Diverticulosis of colon    w/o hemorrage   Heart murmur    TOLD HE HAS A SLIGHT MURMUR   Hyperlipidemia    Inguinal hernia    left side    Thyroid  disease     Past Surgical History:  Procedure Laterality Date   A-FLUTTER ABLATION N/A 03/12/2024   Procedure: A-FLUTTER ABLATION;  Surgeon: Nancey Eulas BRAVO, MD;  Location: [facility];  Service: Cardiovascular;  Laterality: N/A;   AORTIC VALVE REPLACEMENT N/A 08/18/2022   Procedure: AORTIC VALVE REPLACEMENT (AVR) USING EDWARDS INSPIRIS AORTIC VALVE SIZE ;  Surgeon: Murriel Toribio DEL, MD;  Location: [facility] OR;  Service: Open Heart Surgery;  Laterality: N/A;   ATRIAL FIBRILLATION ABLATION N/A 03/12/2024   Procedure: ATRIAL FIBRILLATION ABLATION;  Surgeon: Nancey Eulas BRAVO, MD;  Location: [facility];  Service: Cardiovascular;  Laterality: N/A;   BACK SURGERY  05/12/2005   L5   CARDIAC CATHETERIZATION     CLIPPING OF ATRIAL APPENDAGE N/A 08/18/2022   Procedure: CLIPPING OF ATRIAL APPENDAGE USING ATRICURE 45 ATRICLIP;  Surgeon: Murriel Toribio DEL, MD;  Location: [facility] OR;  Service: Open Heart Surgery;  Laterality: N/A;  Median Sternotomy  COLONOSCOPY  1999   Negative; Dr Aneita   COLONOSCOPY  2004   tics, hemorrhoids   COLONOSCOPY  2009   polyps (T.A.)   CORONARY ARTERY BYPASS GRAFT N/A 08/18/2022   Procedure: CORONARY ARTERY BYPASS GRAFTING (CABG) X2 USING LEFT INTERNAL MAMMARY ARTERY AND LEFT ENDOSCOPICALLY HARVESTED GREATER SAPHENOUS VEIN.;  Surgeon: Murriel Toribio DEL, MD;  Location: [facility] OR;  Service: Open Heart Surgery;  Laterality: N/A;   HERNIA REPAIR  1987   HERNIA REPAIR  10/2003   KNEE SURGERY  1969   Patellar fracture fragment Lt   LAMINECTOMY  2000    L4-5   LEFT HEART CATH AND CORONARY ANGIOGRAPHY N/A 08/16/2022   Procedure: LEFT HEART CATH AND CORONARY ANGIOGRAPHY;  Surgeon: Wendel Lurena POUR, MD;  Location: [facility];  Service: Cardiovascular;  Laterality: N/A;   LUMBAR LAMINECTOMY/DECOMPRESSION MICRODISCECTOMY Right 10/08/2015   Procedure: MICRO LUMBAR DECOMPRESSION L4-L5 AND L5-S1 ON RIGHT ;  Surgeon: Reyes Billing, MD;  Location: [facility];  Service: Orthopedics;  Laterality: Right;   MASS EXCISION  04/26/2011   Procedure: MINOR EXCISION OF MASS;  Surgeon: Krystal CHRISTELLA Spinner, MD;  Location: [facility];  Service: General;  Laterality: Right;  Excise subcutaneous mass right axilla Minor Room   MAZE N/A 08/18/2022   Procedure: MAZE USING ATRICURE EARNEY BEAGLE;  Surgeon: Murriel Toribio DEL, MD;  Location: [facility] OR;  Service: Open Heart Surgery;  Laterality: N/A;   POLYPECTOMY     PROSTATECTOMY  04/16/2005   TEE WITHOUT CARDIOVERSION N/A 08/18/2022   Procedure: TRANSESOPHAGEAL ECHOCARDIOGRAM;  Surgeon: Murriel Toribio DEL, MD;  Location[facility] OR;  Service: Open Heart Surgery;  Laterality: N/A;   TONSILLECTOMY     TOTAL KNEE ARTHROPLASTY Left 07/03/2013   Procedure: LEFT TOTAL KNEE ARTHROPLASTY;  Surgeon: Donnice JONETTA Car, MD;  Location: [facility];  Service: Orthopedics;  Laterality: Left;    Prior to Admission medications   Medication Sig Start Date End Date Taking? Authorizing Provider  apixaban  (ELIQUIS ) 5 MG TABS tablet Take 1 tablet (5 mg total) by mouth 2 (two) times daily. Please begin medication on 02/13/2024 which is 4 weeks prior to your ablation. 02/08/24   Mealor, Augustus E, MD  Ascorbic Acid  (VITAMIN C ) 1000 MG tablet Take 1,000 mg by mouth 2 (two) times daily.    [provider]  benzonatate  (TESSALON ) 100 MG capsule Take 1 capsule (100 mg total) by mouth every 8 (eight) hours. 05/01/24   Aberman, Caroline C, PA-C  beta carotene  25000 UNIT capsule Take 25,000 Units by mouth daily.    [provider]  clonazePAM   (KLONOPIN ) 0.5 MG tablet Take 1 tablet (0.5 mg total) by mouth at bedtime as needed for anxiety. 03/21/24   [name] PARAS, MD  Evolocumab  (REPATHA  SURECLICK) 140 MG/ML SOAJ Inject 140 mg into the skin every 14 (fourteen) days. 11/28/23   Patwardhan, Newman PARAS, MD  fluticasone  (FLONASE ) 50 MCG/ACT nasal spray SPRAY 1 SPRAY IN EACH NOSTRIL ONCE DAILY 10/31/23   [name] PARAS, MD  levothyroxine  (SYNTHROID ) 50 MCG tablet TAKE 1 TABLET BY MOUTH DAILY 03/19/24   [name] PARAS, MD  metoprolol  succinate (TOPROL  XL) 25 MG 24 hr tablet Take 1 tablet (25 mg total) by mouth at bedtime. 12/29/23   Patwardhan, Newman PARAS, MD  pyridOXINE  (VITAMIN B-6) 100 MG tablet Take 100 mg by mouth daily.    [provider]  rosuvastatin  (CRESTOR ) 40 MG tablet TAKE 1 TABLET BY MOUTH DAILY 02/27/24   Burns,  Glade PARAS, MD  triamcinolone  cream (KENALOG) 0.1 % Apply 1 Application topically daily as needed (Skin rash on legs). 12/26/23   [provider]  vitamin E 400 UNIT capsule Take 400 Units by mouth daily.    [provider]    Family History  Problem Relation Age of Onset   Lung cancer Mother    COPD Mother    Cancer Father        Bladder Cancer   Prostate cancer Father    Colon cancer Father 40   Urolithiasis Father    Lung cancer Sister    Alcohol abuse Paternal Uncle    Stroke Paternal Uncle 33   Hypertension Paternal Uncle    Asthma Neg Hx    Heart disease Neg Hx    Esophageal cancer Neg Hx    Stomach cancer Neg Hx    Liver disease Neg Hx    Colon polyps Neg Hx    Rectal cancer Neg Hx      Social History   Tobacco Use   Smoking status: Former    Current packs/day: 0.00    Types: Cigarettes    Quit date: 1983    Years since quitting: 42.9   Smokeless tobacco: Never   Tobacco comments:    Former smoker 04/10/24  Vaping Use   Vaping status: Never Used  Substance Use Topics   Alcohol use: Yes    Alcohol/week: 7.0 - 14.0 standard drinks of alcohol    Types: 7 - 14 Standard drinks  or equivalent per week    Comment: 1-2 scotch or wine daily 04/10/24   Drug use: No    Allergies as of 05/02/2024 - Reviewed 05/01/2024  Allergen Reaction Noted   Atorvastatin Other (See Comments)     Review of Systems:    All systems reviewed and negative except where noted in HPI.   Physical Exam:  There were no vitals taken for this visit. No LMP for male patient.  General:   Alert,  Well-developed, well-nourished, pleasant and cooperative in NAD Lungs:  Respirations even and unlabored.  Clear throughout to auscultation.   No wheezes, crackles, or rhonchi. No acute distress. Heart:  Regular rate and rhythm; no murmurs, clicks, rubs, or gallops. Abdomen:  Normal bowel sounds.  No bruits.  Soft, and non-distended without masses, hepatosplenomegaly or hernias noted.  No Tenderness.  No guarding or rebound tenderness.    Neurologic:  Alert and oriented x3;  grossly normal neurologically. Psych:  Alert and cooperative. Normal mood and affect.   Imaging Studies: DG Chest 2 View Result Date: 05/01/2024 CLINICAL DATA:  Shortness of breath with cough and congestion. EXAM: CHEST - 2 VIEW COMPARISON:  03/21/2024 FINDINGS: The lungs are clear without focal pneumonia, edema, pneumothorax or pleural effusion. The cardiopericardial silhouette is within normal limits for size. No acute bony abnormality. Status post cardiac valve replacement with left atrial appendage excluder device evident. IMPRESSION: No active cardiopulmonary disease. Electronically Signed   By: Camellia Candle M.D.   On: 05/01/2024 09:11    Labs: CBC    Component Value Date/Time   WBC 10.2 05/01/2024 0902   RBC 4.22 05/01/2024 0902   HGB 13.8 05/01/2024 0902   HGB 14.4 03/21/2024 1600   HCT 40.9 05/01/2024 0902   HCT 44.1 03/21/2024 1600   PLT 220 05/01/2024 0902   PLT 284 03/21/2024 1600   MCV 96.9 05/01/2024 0902   MCV 100 (H) 03/21/2024 1600    CMP  Component Value Date/Time   NA 141 05/01/2024 0902   NA  143 04/04/2024 1234   K 4.3 05/01/2024 0902   CL 106 05/01/2024 0902   CO2 21 (L) 05/01/2024 0902   GLUCOSE 70 05/01/2024 0902   BUN 18 05/01/2024 0902   BUN 16 04/04/2024 1234   CREATININE 0.96 05/01/2024 0902   CALCIUM  9.6 05/01/2024 0902   PROT 7.2 03/07/2024 0756   PROT 6.9 10/28/2022 0847   ALBUMIN  4.4 03/07/2024 0756   ALBUMIN  4.3 10/28/2022 0847   AST 31 03/07/2024 0756   ALT 20 03/07/2024 0756   ALKPHOS 49 03/07/2024 0756   BILITOT 0.6 03/07/2024 0756   BILITOT 0.5 10/28/2022 0847   GFRNONAA >60 05/01/2024 0902   GFRAA >60 10/09/2015 0400    Assessment and Plan:   THAMAS APPLEYARD is a 78 y.o. y/o male has been referred for   1.  History of adenomatous colon polyps  2.  Family history of colon cancer in first-degree relative (father age 90)  3.  Comorbidities  Assessment and Plan Assessment & Plan       Follow up ***  Jeremy Console, PA-C

## 2024-05-01 NOTE — ED Notes (Signed)
 Pt ambulated, Pt had SOB upon ambulating and stated that he was still trying to catch his breath upon returning to the room... HR 80 SpO2 never dropped below 92%... Provider aware.

## 2024-05-01 NOTE — Discharge Instructions (Signed)
 It was a pleasure taking care of you today.  As discussed, your workup was reassuring.  Cardiac labs were normal.  Chest x-ray did not show evidence of pneumonia.  I am sending you home with cough medication.  Take as needed.  Please follow-up with cardiology for further evaluation.  Return to the ER for any worsening symptoms.

## 2024-05-01 NOTE — ED Notes (Signed)
 Blood obtained by Triage

## 2024-05-01 NOTE — ED Provider Notes (Signed)
 [place] EMERGENCY DEPARTMENT AT [facility] Provider Note   CSN: [id number] Arrival date & time: 05/01/24  0840     Patient presents with: Shortness of Breath   Jeremy Waters is a 78 y.o. male with a past medical history significant for hyperlipidemia, A-fib on Eliquis , CAD s/p CABG x2 who presents to the ED due to shortness of breath that started today.  Patient notes he was at rest and was having difficulties breathing earlier today.  No history of COPD or asthma. Typically works out daily with no issues. Notes he has had a cough for the past 2 weeks.  At home COVID test negative around Thanksgiving.  Denies fever.  Wife at bedside notes she is concerned about his heart given his significant cardiac history.  Previous valve replacement. Patient denies chest pain.  Denies lower extremity edema.  Denies history of blood clots, recent surgeries, recent long immobilizations, or hormonal treatments.  Patient has been compliant with his Eliquis  with no missed doses.  Denies nausea, vomiting, and diarrhea.  History obtained from patient and past medical records. No interpreter used during encounter.       Prior to Admission medications   Medication Sig Start Date End Date Taking? Authorizing Provider  benzonatate  (TESSALON ) 100 MG capsule Take 1 capsule (100 mg total) by mouth every 8 (eight) hours. 05/01/24  Yes Egor Fullilove C, PA-C  apixaban  (ELIQUIS ) 5 MG TABS tablet Take 1 tablet (5 mg total) by mouth 2 (two) times daily. Please begin medication on 02/13/2024 which is 4 weeks prior to your ablation. 02/08/24   Mealor, Augustus E, MD  Ascorbic Acid  (VITAMIN C ) 1000 MG tablet Take 1,000 mg by mouth 2 (two) times daily.    [provider]  beta carotene  25000 UNIT capsule Take 25,000 Units by mouth daily.    [provider]  clonazePAM  (KLONOPIN ) 0.5 MG tablet Take 1 tablet (0.5 mg total) by mouth at bedtime as needed for anxiety. 03/21/24   [name] PARAS, MD   Evolocumab  (REPATHA  SURECLICK) 140 MG/ML SOAJ Inject 140 mg into the skin every 14 (fourteen) days. 11/28/23   Patwardhan, Newman PARAS, MD  fluticasone  (FLONASE ) 50 MCG/ACT nasal spray SPRAY 1 SPRAY IN EACH NOSTRIL ONCE DAILY 10/31/23   [name] PARAS, MD  levothyroxine  (SYNTHROID ) 50 MCG tablet TAKE 1 TABLET BY MOUTH DAILY 03/19/24   [name] PARAS, MD  metoprolol  succinate (TOPROL  XL) 25 MG 24 hr tablet Take 1 tablet (25 mg total) by mouth at bedtime. 12/29/23   Patwardhan, Newman PARAS, MD  pyridOXINE  (VITAMIN B-6) 100 MG tablet Take 100 mg by mouth daily.    [provider]  rosuvastatin  (CRESTOR ) 40 MG tablet TAKE 1 TABLET BY MOUTH DAILY 02/27/24   [name] PARAS, MD  triamcinolone  cream (KENALOG) 0.1 % Apply 1 Application topically daily as needed (Skin rash on legs). 12/26/23   [provider]  vitamin E 400 UNIT capsule Take 400 Units by mouth daily.    [provider]    Allergies: Atorvastatin    Review of Systems  Constitutional:  Negative for fever.  Respiratory:  Positive for cough and shortness of breath.   Cardiovascular:  Negative for chest pain.  Gastrointestinal:  Negative for abdominal pain.    Updated Vital Signs BP 123/63   Pulse 69   Temp 98.3 F (36.8 C) (Oral)   Resp 15   SpO2 95%   Physical Exam Vitals and nursing note reviewed.  Constitutional:  General: He is not in acute distress.    Appearance: He is not ill-appearing.  HENT:     Head: Normocephalic.  Eyes:     Pupils: Pupils are equal, round, and reactive to light.  Cardiovascular:     Rate and Rhythm: Normal rate and regular rhythm.     Pulses: Normal pulses.     Heart sounds: Normal heart sounds. No murmur heard.    No friction rub. No gallop.  Pulmonary:     Effort: Pulmonary effort is normal.     Breath sounds: Normal breath sounds.     Comments: Respirations equal and unlabored, patient able to speak in full sentences, lungs clear to auscultation bilaterally Abdominal:      General: Abdomen is flat. There is no distension.     Palpations: Abdomen is soft.     Tenderness: There is no abdominal tenderness. There is no guarding or rebound.  Musculoskeletal:        General: Normal range of motion.     Cervical back: Neck supple.     Comments: No lower extremity edema  Skin:    General: Skin is warm and dry.  Neurological:     General: No focal deficit present.     Mental Status: He is alert.  Psychiatric:        Mood and Affect: Mood normal.        Behavior: Behavior normal.     (all labs ordered are listed, but only abnormal results are displayed) Labs Reviewed  BASIC METABOLIC PANEL WITH GFR - Abnormal; Notable for the following components:      Result Value   CO2 21 (*)    All other components within normal limits  PRO BRAIN NATRIURETIC PEPTIDE - Abnormal; Notable for the following components:   Pro Brain Natriuretic Peptide 327.0 (*)    All other components within normal limits  RESP PANEL BY RT-PCR (RSV, FLU A&B, COVID)  RVPGX2  CBC  TROPONIN T, HIGH SENSITIVITY    EKG: EKG Interpretation Date/Time:  Tuesday May 01 2024 08:52:34 EST Ventricular Rate:  75 PR Interval:  177 QRS Duration:  112 QT Interval:  400 QTC Calculation: 447 R Axis:   39  Text Interpretation: Sinus rhythm Incomplete right bundle branch block Confirmed by Patsey Lot [phone]) on 05/01/2024 10:41:54 AM  Radiology: [facility] Chest 2 View Result Date: 05/01/2024 CLINICAL DATA:  Shortness of breath with cough and congestion. EXAM: CHEST - 2 VIEW COMPARISON:  03/21/2024 FINDINGS: The lungs are clear without focal pneumonia, edema, pneumothorax or pleural effusion. The cardiopericardial silhouette is within normal limits for size. No acute bony abnormality. Status post cardiac valve replacement with left atrial appendage excluder device evident. IMPRESSION: No active cardiopulmonary disease. Electronically Signed   By: Camellia Candle M.D.   On: 05/01/2024 09:11      Procedures   Medications Ordered in the ED  benzonatate  (TESSALON ) capsule 100 mg (100 mg Oral Given 05/01/24 9071)                                    Medical Decision Making Amount and/or Complexity of Data Reviewed Independent Historian: spouse External Data Reviewed: notes.    Details: Cardiology notes reviewed Labs: ordered. Decision-making details documented in ED Course. Radiology: ordered and independent interpretation performed. Decision-making details documented in ED Course. ECG/medicine tests: ordered and independent interpretation performed. Decision-making details documented in ED Course.  Risk Prescription drug management.   This patient presents to the ED for concern of SOB, this involves an extensive number of treatment options, and is a complaint that carries with it a high risk of complications and morbidity.  The differential diagnosis includes PNA, viral process, ACS, PE, etc  78 year old male presents to the ED due to shortness of breath that worsened this morning.  Notes he has had a cough for the past 2 weeks.  Patient having significant shortness of breath at rest earlier today which prompted him to report to the ED. Typically works out daily with no issues. Wife at bedside is concerned about patient's heart given his significant cardiac history.  History of valve replacement and CAD status post CABG x 2.  Upon arrival patient afebrile, not tachycardic or hypoxic.  Patient well-appearing on exam.  Lungs clear to auscultation bilaterally.  No lower extremity edema.  Routine labs ordered.  Troponin and BNP added.  Chest x-ray to rule evidence of pneumonia.  RVP to rule out infection.  Tessalon  Perles given.  Lower suspicion for PE given patient has been compliant with his Eliquis  and no evidence of DVT on exam.  CBC unremarkable.  No leukocytosis.  Normal hemoglobin.  BMP reassuring.  Normal renal function.  BNP very mildly elevated at 327.  Patient does not  appear fluid overloaded on exam.  Low suspicion for CHF.  Troponin normal.  EKG normal sinus rhythm.  No signs of acute ischemia.  Low suspicion for ACS.  RVP negative.  Chest x-ray personally reviewed and interpreted which negative for signs of pneumonia, pneumothorax, widened mediastinum.  Patient able to ambulate here in the ED and maintain O2 saturation above 94% without any difficulties.  No wheeze or stridor to suggest COPD.  Patient well-appearing on exam.  No signs of respiratory distress.  Low suspicion for ACS.  Patient has been compliant with his Eliquis .  Low suspicion for PE.  No evidence of DVT on exam.  Patient had an unchanged echocardiogram in November. Low suspicion for failing valve. Advised patient to follow-up with his cardiologist for further evaluation.  Patient stable for discharge. Strict ED precautions discussed with patient. Patient states understanding and agrees to plan. Patient discharged home in no acute distress and stable vitals  Discussed with Dr. Patsey who evaluated patient at bedside and agrees with assessment and plan.  Co morbidities that complicate the patient evaluation  CAD Cardiac Monitoring: / EKG:  The patient was maintained on a cardiac monitor.  I personally viewed and interpreted the cardiac monitored which showed an underlying rhythm of: NSR  Social Determinants of Health:  Elderly >65  Test / Admission - Considered:  Considered admission; however patient with normal o2 saturation on room air.  Reassuring workup.  Feel patient is stable for outpatient follow-up with cardiology.      Final diagnoses:  Shortness of breath  Acute cough    ED Discharge Orders          Ordered    benzonatate  (TESSALON ) 100 MG capsule  Every 8 hours        05/01/24 1058               Lorelle Aleck BROCKS, PA-C 05/01/24 1058

## 2024-05-01 NOTE — ED Notes (Signed)
 DC paperwork given and verbally understood.

## 2024-05-02 ENCOUNTER — Encounter: Payer: Self-pay | Admitting: Physician Assistant

## 2024-05-02 ENCOUNTER — Ambulatory Visit: Admitting: Physician Assistant

## 2024-05-02 VITALS — BP 90/60 | HR 71 | Ht 67.0 in | Wt 158.4 lb

## 2024-05-02 DIAGNOSIS — Z7901 Long term (current) use of anticoagulants: Secondary | ICD-10-CM

## 2024-05-02 DIAGNOSIS — I4891 Unspecified atrial fibrillation: Secondary | ICD-10-CM | POA: Diagnosis not present

## 2024-05-02 DIAGNOSIS — R140 Abdominal distension (gaseous): Secondary | ICD-10-CM | POA: Diagnosis not present

## 2024-05-02 DIAGNOSIS — R143 Flatulence: Secondary | ICD-10-CM

## 2024-05-02 DIAGNOSIS — Z952 Presence of prosthetic heart valve: Secondary | ICD-10-CM | POA: Diagnosis not present

## 2024-05-02 DIAGNOSIS — Z860101 Personal history of adenomatous and serrated colon polyps: Secondary | ICD-10-CM

## 2024-05-02 DIAGNOSIS — R0602 Shortness of breath: Secondary | ICD-10-CM | POA: Diagnosis not present

## 2024-05-02 DIAGNOSIS — Z951 Presence of aortocoronary bypass graft: Secondary | ICD-10-CM

## 2024-05-02 DIAGNOSIS — Z800 Family history of malignant neoplasm of digestive organs: Secondary | ICD-10-CM | POA: Diagnosis not present

## 2024-05-02 DIAGNOSIS — Z860100 Personal history of colon polyps, unspecified: Secondary | ICD-10-CM

## 2024-05-02 DIAGNOSIS — I480 Paroxysmal atrial fibrillation: Secondary | ICD-10-CM

## 2024-05-02 NOTE — Progress Notes (Signed)
 Noted

## 2024-05-02 NOTE — Patient Instructions (Addendum)
 I recommend avoid beans, bananas, eggs, broccoli and cabbage to prevent lower intestinal gas. You can also try over the counter Beano before problems foods. You may continue Gas-X as needed.  Please follow up sooner if symptoms increase or worsen  Due to recent changes in healthcare laws, you may see the results of your imaging and laboratory studies on MyChart before your provider has had a chance to review them.  We understand that in some cases there may be results that are confusing or concerning to you. Not all laboratory results come back in the same time frame and the provider may be waiting for multiple results in order to interpret others.  Please give us  48 hours in order for your provider to thoroughly review all the results before contacting the office for clarification of your results.   Thank you for trusting me with your gastrointestinal care!   Ellouise Console, PA-C _______________________________________________________  If your blood pressure at your visit was 140/90 or greater, please contact your primary care physician to follow up on this.  _______________________________________________________  If you are age 40 or older, your body mass index should be between 23-30. Your Body mass index is 24.81 kg/m. If this is out of the aforementioned range listed, please consider follow up with your Primary Care Provider.  If you are age 78 or younger, your body mass index should be between 19-25. Your Body mass index is 24.81 kg/m. If this is out of the aformentioned range listed, please consider follow up with your Primary Care Provider.   ________________________________________________________  The [place] [facility] providers would like to encourage you to use MYCHART to communicate with providers for non-urgent requests or questions.  Due to long hold times on the telephone, sending your provider a message by [facility] may be a faster and more efficient way to get a response.  Please  allow 48 business hours for a response.  Please remember that this is for non-urgent requests.  _______________________________________________________

## 2024-05-07 ENCOUNTER — Ambulatory Visit: Admitting: Cardiology

## 2024-05-14 ENCOUNTER — Ambulatory Visit: Payer: Self-pay

## 2024-05-14 ENCOUNTER — Encounter: Payer: Self-pay | Admitting: Family Medicine

## 2024-05-14 ENCOUNTER — Ambulatory Visit: Admitting: Family Medicine

## 2024-05-14 VITALS — BP 102/70 | HR 73 | Temp 97.7°F | Ht 67.0 in | Wt 159.0 lb

## 2024-05-14 DIAGNOSIS — J069 Acute upper respiratory infection, unspecified: Secondary | ICD-10-CM | POA: Diagnosis not present

## 2024-05-14 DIAGNOSIS — R0609 Other forms of dyspnea: Secondary | ICD-10-CM | POA: Diagnosis not present

## 2024-05-14 DIAGNOSIS — I4891 Unspecified atrial fibrillation: Secondary | ICD-10-CM | POA: Diagnosis not present

## 2024-05-14 DIAGNOSIS — R52 Pain, unspecified: Secondary | ICD-10-CM

## 2024-05-14 DIAGNOSIS — I4892 Unspecified atrial flutter: Secondary | ICD-10-CM | POA: Diagnosis not present

## 2024-05-14 DIAGNOSIS — R051 Acute cough: Secondary | ICD-10-CM

## 2024-05-14 DIAGNOSIS — I5032 Chronic diastolic (congestive) heart failure: Secondary | ICD-10-CM

## 2024-05-14 LAB — POCT INFLUENZA A/B
Influenza A, POC: NEGATIVE
Influenza B, POC: NEGATIVE

## 2024-05-14 LAB — POC COVID19 BINAXNOW: SARS Coronavirus 2 Ag: NEGATIVE

## 2024-05-14 MED ORDER — BENZONATATE 100 MG PO CAPS: 100.0000 mg | ORAL_CAPSULE | Freq: Three times a day (TID) | ORAL | 0 refills | Status: DC

## 2024-05-14 NOTE — Telephone Encounter (Signed)
 FYI Only or Action Required?: FYI only for provider: appointment scheduled on 05/14/2024 at 9:40am with Vickie Hensen NP-C .  Patient was last seen in primary care on 03/21/2024 by [name] PARAS, MD.  Called Nurse Triage reporting Cough.  Symptoms began yesterday.  Interventions attempted: OTC medications: Mucinex DM and Rest, hydration, or home remedies.  Symptoms are: gradually worsening.  Triage Disposition: See Physician Within 24 Hours  Patient/caregiver understands and will follow disposition?: Yes       Copied from CRM #[id number]. Topic: Clinical - Red Word Triage >> May 14, 2024  8:00 AM Berneda FALCON wrote: [facility] that prompted transfer to Nurse Triage: Pt states he is having body aches, cough, no fever, exposed to grandchildren, ongoing a couple of days. No vomiting or diarrhea. Reason for Disposition  [1] Continuous (nonstop) coughing interferes with work or school AND [2] no improvement using cough treatment per Care Advice  Answer Assessment - Initial Assessment Questions Cough---mostly dry Denies fever, difficulty breathing, chest pain  Generalized malaise Started yesterday Patient was with his grandchildren the day before Patient taken Mucinex DM  Patient had an ablation about two months ago Patient is advised to call us  back if anything changes or with any further questions/concerns. Patient is advised that if anything worsens to go to the Emergency Room. Patient verbalized understanding.    1. ONSET: When did the cough begin?      Yesterday and patient was around his grandchildren the day before 3. SPUTUM: Describe the color of your sputum (e.g., none, dry cough; clear, white, yellow, green)     Slightly and clear 5. DIFFICULTY BREATHING: Are you having difficulty breathing? If Yes, ask: How bad is it? (e.g., mild, moderate, severe)      congestion 6. FEVER: Do you have a fever? If Yes, ask: What is your temperature, how was it measured, and  when did it start?     denies 7. CARDIAC HISTORY: Do you have any history of heart disease? (e.g., heart attack, congestive heart failure)      Ablation about two months ago 10. OTHER SYMPTOMS: Do you have any other symptoms? (e.g., runny nose, wheezing, chest pain)       Generalized malaise  Protocols used: Cough - Acute Non-Productive-A-AH

## 2024-05-14 NOTE — Progress Notes (Signed)
 Subjective:    Discussed the use of AI scribe software for clinical note transcription with the patient, who gave verbal consent to proceed.  History of Present Illness Jeremy Waters is a 78 year old male with hx of atrial fibrillation, coronary artery disease, and chronic heart failure who presents with body aches and dry cough.  Constitutional and respiratory symptoms - Body aches and dry cough for the past two days - Occasional clear sputum production - Mild headache and generalized achiness, described as feeling beat up - No fever, chills, throat pain, ear pain, sneezing, or runny nose - Recent exposure to grandchildren with influenza - Current symptoms are less severe than prior episode on May 01, 2024 - No gasping for air or significant shortness of breath as when he went to the ED  Cardiac symptoms and history - Atrial fibrillation status post ablation two months ago - On Eliquis  for anticoagulation - Shortness of breath with exertion, specifically climbing stairs - No palpitations  Coronary artery disease and heart failure - History of coronary artery disease status post CABG x2 - Chronic heart failure with prior workup showing slightly elevated BNP - History of hyperlipidemia  Recent emergency department visit - Evaluated in the emergency department on May 01, 2024, for shortness of breath - COVID-19 and influenza tests were negative at that time - Tessalon  Perles provided relief for prior cough   ROS as in subjective.   Objective: Vitals:   05/14/24 0930  BP: 102/70  Pulse: 73  Temp: 97.7 F (36.5 C)  SpO2: 95%    General appearance: Alert, WD/WN, no distress                             Skin: warm, no rash                           Head: no sinus tenderness                            Eyes: conjunctiva normal, corneas clear, PERRLA                            Ears:  external ear canals normal                          Nose: septum midline,  turbinates swollen             Mouth/throat: MMM, tongue normal, mild pharyngeal erythema                           Neck: supple, no adenopathy, no thyromegaly, nontender                          Heart: RRR                         Lungs: CTA bilaterally, no wheezes, rales, or rhonchi Extremities: No edema      Assessment/Plan:  Assessment and Plan Assessment & Plan Acute upper respiratory tract infection Symptoms include body aches, dry cough, and shortness of breath. Negative COVID and flu tests. Symptoms began two days ago. Differential includes viral infection, possibly flu, despite negative test due to  recent exposure to grandchildren with flu. - Advised use of plain Mucinex (guaifenesin) for cough and congestion. - Refilled Tessalon  Perles for cough suppression. - Advised to stay well hydrated - Recommended Tylenol  for symptom relief, avoiding NSAIDs due to Eliquis  use. - Instructed to report any worsening symptoms through MyChart or phone call.  Atrial fibrillation and atrial flutter, post-ablation Status post heart ablation two months ago for atrial flutter. Currently on Eliquis . No palpitations or chest pain.  - Continue Eliquis  as prescribed.  Coronary artery disease, status post CABG Status post CABG times two. No current chest pain or significant cardiac symptoms.  Chronic heart failure No current symptoms of fluid retention or swelling. BNP slightly elevated in past emergency department visit. - Continue current management and monitor for symptoms of fluid overload.  Reviewed notes and results from emergency department visit on 05/01/2024.

## 2024-05-14 NOTE — Patient Instructions (Signed)
 Your COVID and flu tests are both negative today.  I recommend taking over-the-counter plain Mucinex (not DM) and staying well-hydrated.  You can continue the benzonatate  (Tessalon  Perles) for cough as needed.  You can take Tylenol  if needed for headache or bodyaches but avoid any over-the-counter NSAIDs (pain medications) while on Eliquis .  If you have any new or worsening symptoms, please send us  a MyChart message or call us .

## 2024-05-16 ENCOUNTER — Encounter (HOSPITAL_COMMUNITY): Payer: Self-pay | Admitting: *Deleted

## 2024-05-16 ENCOUNTER — Encounter: Payer: Self-pay | Admitting: Family Medicine

## 2024-05-16 ENCOUNTER — Ambulatory Visit (HOSPITAL_COMMUNITY)
Admission: EM | Admit: 2024-05-16 | Discharge: 2024-05-16 | Disposition: A | Discharge status: Home / Self Care | Attending: Family Medicine | Admitting: Family Medicine

## 2024-05-16 ENCOUNTER — Ambulatory Visit: Payer: Self-pay | Admitting: Family Medicine

## 2024-05-16 DIAGNOSIS — R6889 Other general symptoms and signs: Secondary | ICD-10-CM

## 2024-05-16 DIAGNOSIS — J101 Influenza due to other identified influenza virus with other respiratory manifestations: Secondary | ICD-10-CM

## 2024-05-16 LAB — POC COVID19/FLU A&B COMBO
Covid Antigen, POC: NEGATIVE
Influenza A Antigen, POC: POSITIVE — AB
Influenza B Antigen, POC: NEGATIVE

## 2024-05-16 MED ORDER — HYDROCODONE BIT-HOMATROP MBR 5-1.5 MG/5ML PO SOLN: 2.5000 mL | Freq: Four times a day (QID) | ORAL | 0 refills | Status: DC | PRN

## 2024-05-16 NOTE — ED Triage Notes (Signed)
 Pt was tested for flu at [facility] this morning he was positive for flu A. He has cough, congestion. He is taking mucinex Q4 hours, tessalon  pearls for cough (which aren't working)  He states his sx started last Saturday.

## 2024-05-16 NOTE — Discharge Instructions (Addendum)
 Be aware, your cough medication may cause drowsiness. Please do not drive, operate heavy machinery or make important decisions while on this medication, it can cloud your judgement.

## 2024-05-16 NOTE — Telephone Encounter (Signed)
 FYI Only or Action Required?: Action required by provider: clinical question for provider and update on patient condition.  Patient was last seen in primary care on 05/14/2024 by Lendia Boby CROME, NP-C.  Called Nurse Triage reporting Cough.  Symptoms began several days ago.  Interventions attempted: Rest, hydration, or home remedies.  Symptoms are: unchanged.  Triage Disposition: Call PCP Now  Patient/caregiver understands and will follow disposition?: No, wishes to speak with PCP      Copied from CRM #[id number]. Topic: Clinical - Red Word Triage >> May 16, 2024 11:51 AM Mesmerise C wrote: [facility] that prompted transfer to Nurse Triage: Patient stated since appt on 12/22 has only gotten worse the medication hasn't worked and tested positive with flu a Reason for Disposition  [1] Caller requests to speak ONLY to PCP AND [2] URGENT question  Answer Assessment - Initial Assessment Questions 1. REASON FOR CALL or QUESTION: What is your reason for calling today? or How can I best   Patient called into triage stating symptoms of cough has persisted. Patient was read the message from PCP stated he would need to follow up in urgent care. He declined wanting to do so and stated he has a rapid flu test preformed and is positive for Influenza A, and wants medications called in for the persistent symptoms. Please advise.  Protocols used: PCP Call - No Triage-A-AH

## 2024-05-16 NOTE — ED Provider Notes (Signed)
 " [facility]   [id number] 05/16/24 Arrival Time: 1226  ASSESSMENT & PLAN:  1. Influenza A   2. Not feeling great    Discussed typical duration. Results for orders placed or performed during the hospital encounter of 05/16/24  POC Covid19/Flu A&B Antigen   Collection Time: 05/16/24  3:37 PM  Result Value Ref Range   Influenza A Antigen, POC Positive (A) Negative   Influenza B Antigen, POC Negative Negative   Covid Antigen, POC Negative Negative   OTC symptom care as needed.  Meds ordered this encounter  Medications   HYDROcodone  bit-homatropine (HYCODAN) 5-1.5 MG/5ML syrup    Sig: Take 2.5-5 mLs by mouth every 6 (six) hours as needed for cough.    Dispense:  90 mL    Refill:  0     Follow-up Information     [facility] at [facility].   Specialty: Urgent Care Why: If worsening or failing to improve as anticipated. Contact information: [address] [place]  [address] [phone]                Reviewed expectations re: course of current medical issues. Questions answered. Outlined signs and symptoms indicating need for more acute intervention. Understanding verbalized. After Visit Summary given.   SUBJECTIVE: History from: Patient. Jeremy Waters is a 78 y.o. male. Pt was tested for flu [facility] this morning he was positive for flu A. He has cough, congestion. He is taking mucinex Q4 hours, tessalon  pearls for cough (which aren't working)  He states his sx started last Saturday.  Denies: difficulty breathing. Normal PO intake without n/v/d.  OBJECTIVE:  Vitals:   05/16/24 1505  BP: 115/75  Pulse: 76  Resp: 16  Temp: 99.4 F (37.4 C)  TempSrc: Oral  SpO2: 93%    General appearance: alert; no distress Eyes: PERRLA; EOMI; conjunctiva normal HENT: [place]; AT; with nasal congestion Neck: supple  Lungs: speaks full sentences without difficulty; unlabored; dry cough Extremities: no edema Skin: warm  and dry Neurologic: normal gait Psychological: alert and cooperative; normal mood and affect  Labs: Results for orders placed or performed during the hospital encounter of 05/16/24  POC Covid19/Flu A&B Antigen   Collection Time: 05/16/24  3:37 PM  Result Value Ref Range   Influenza A Antigen, POC Positive (A) Negative   Influenza B Antigen, POC Negative Negative   Covid Antigen, POC Negative Negative   Labs Reviewed  POC COVID19/FLU A&B COMBO - Abnormal; Notable for the following components:      Result Value   Influenza A Antigen, POC Positive (*)    All other components within normal limits    Imaging: No results found.  Allergies[1]  Past Medical History:  Diagnosis Date   Adenomatous colon polyp 09/2007   Arthritis    OA / PAIN LEFT KNEE   Atrial fibrillation (HCC) 08/25/2022   Atrial flutter (HCC) 10/02/2022   Cancer (HCC)    prostate cancer   Cataract    removed both eyes   Coronary artery disease    COVID-19 virus infection    Diverticulosis of colon    w/o hemorrage   Heart murmur    TOLD HE HAS A SLIGHT MURMUR   Hyperlipidemia    Inguinal hernia    left side    Thyroid  disease    Social History   Socioeconomic History   Marital status: Married    Spouse name: Mary   Number of children: 2  Years of education: 55   Highest education level: Bachelor's degree (e.g., BA, AB, BS)  Occupational History   Occupation: insur exe    Employer: [employer]   Occupation: Retired  Tobacco Use   Smoking status: Former    Current packs/day: 0.00    Types: Cigarettes    Quit date: 1983    Years since quitting: 43.0   Smokeless tobacco: Never   Tobacco comments:    Former smoker 04/10/24  Vaping Use   Vaping status: Never Used  Substance and Sexual Activity   Alcohol use: Yes    Alcohol/week: 7.0 - 14.0 standard drinks of alcohol    Types: 7 - 14 Standard drinks or equivalent per week    Comment: 1-2 scotch or wine daily 04/10/24   Drug use:  No   Sexual activity: Yes  Other Topics Concern   Not on file  Social History Narrative   Exercises daily.  Lives with wife./2025  Has one puppy.            Social Drivers of Health   Tobacco Use: Medium Risk (05/16/2024)   Patient History    Smoking Tobacco Use: Former    Smokeless Tobacco Use: Never    Passive Exposure: Not on file  Financial Resource Strain: Low Risk (03/11/2024)   Overall Financial Resource Strain (CARDIA)    Difficulty of Paying Living Expenses: Not hard at all  Food Insecurity: No Food Insecurity (03/11/2024)   Epic    Worried About Programme Researcher, Broadcasting/film/video in the Last Year: Never true    Ran Out of Food in the Last Year: Never true  Transportation Needs: No Transportation Needs (03/11/2024)   Epic    Lack of Transportation (Medical): No    Lack of Transportation (Non-Medical): No  Physical Activity: Sufficiently Active (03/11/2024)   Exercise Vital Sign    Days of Exercise per Week: 5 days    Minutes of Exercise per Session: 60 min  Stress: No Stress Concern Present (03/11/2024)   [employer] of Occupational Health - Occupational Stress Questionnaire    Feeling of Stress: Not at all  Social Connections: Socially Integrated (03/11/2024)   Social Connection and Isolation Panel    Frequency of Communication with Friends and Family: More than three times a week    Frequency of Social Gatherings with Friends and Family: Three times a week    Attends Religious Services: More than 4 times per year    Active Member of Clubs or Organizations: Yes    Attends Banker Meetings: More than 4 times per year    Marital Status: Married  Catering Manager Violence: Not At Risk (04/07/2024)   Received from [facility]   HITS    Over the last 12 months how often did your partner scream or curse at you?: Never    Over the last 12 months how often did your partner physically hurt you?: Never    Over the last 12 months how often did your partner insult  you or talk down to you?: Never    Over the last 12 months how often did your partner threaten you with physical harm?: Never  Depression (PHQ2-9): Low Risk (03/21/2024)   Depression (PHQ2-9)    PHQ-2 Score: 0  Alcohol Screen: Medium Risk (03/11/2024)   Alcohol Screen    Last Alcohol Screening Score (AUDIT): 13  Housing: Unknown (03/11/2024)   Epic    Unable to Pay for Housing in the Last Year:  No    Number of Times Moved in the Last Year: Not on file    Homeless in the Last Year: No  Utilities: Not At Risk (03/15/2024)   Epic    Threatened with loss of utilities: No  Health Literacy: Adequate Health Literacy (03/15/2024)   B1300 Health Literacy    Frequency of need for help with medical instructions: Never   Family History  Problem Relation Age of Onset   Lung cancer Mother    COPD Mother    Cancer Father        Bladder Cancer   Prostate cancer Father    Colon cancer Father 42   Urolithiasis Father    Lung cancer Sister    Alcohol abuse Paternal Uncle    Stroke Paternal Uncle 6   Hypertension Paternal Uncle    Asthma Neg Hx    Heart disease Neg Hx    Esophageal cancer Neg Hx    Stomach cancer Neg Hx    Liver disease Neg Hx    Colon polyps Neg Hx    Rectal cancer Neg Hx    Past Surgical History:  Procedure Laterality Date   A-FLUTTER ABLATION N/A 03/12/2024   Procedure: A-FLUTTER ABLATION;  Surgeon: Mealor, Eulas BRAVO, MD;  Location: [facility];  Service: Cardiovascular;  Laterality: N/A;   AORTIC VALVE REPLACEMENT N/A 08/18/2022   Procedure: AORTIC VALVE REPLACEMENT (AVR) USING EDWARDS INSPIRIS AORTIC VALVE SIZE ;  Surgeon: Murriel Toribio DEL, MD;  Location: [facility] OR;  Service: Open Heart Surgery;  Laterality: N/A;   ATRIAL FIBRILLATION ABLATION N/A 03/12/2024   Procedure: ATRIAL FIBRILLATION ABLATION;  Surgeon: Nancey Eulas BRAVO, MD;  Location: [facility];  Service: Cardiovascular;  Laterality: N/A;   BACK SURGERY  05/12/2005   L5   CARDIAC  CATHETERIZATION     CLIPPING OF ATRIAL APPENDAGE N/A 08/18/2022   Procedure: CLIPPING OF ATRIAL APPENDAGE USING ATRICURE 45 ATRICLIP;  Surgeon: Murriel Toribio DEL, MD;  Location: [facility] OR;  Service: Open Heart Surgery;  Laterality: N/A;  Median Sternotomy   COLONOSCOPY  1999   Negative; Dr Aneita   COLONOSCOPY  2004   tics, hemorrhoids   COLONOSCOPY  2009   polyps (T.A.)   CORONARY ARTERY BYPASS GRAFT N/A 08/18/2022   Procedure: CORONARY ARTERY BYPASS GRAFTING (CABG) X2 USING LEFT INTERNAL MAMMARY ARTERY AND LEFT ENDOSCOPICALLY HARVESTED GREATER SAPHENOUS VEIN.;  Surgeon: Murriel Toribio DEL, MD;  Location: [facility] OR;  Service: Open Heart Surgery;  Laterality: N/A;   HERNIA REPAIR  1987   HERNIA REPAIR  10/2003   KNEE SURGERY  1969   Patellar fracture fragment Lt   LAMINECTOMY  2000   L4-5   LEFT HEART CATH AND CORONARY ANGIOGRAPHY N/A 08/16/2022   Procedure: LEFT HEART CATH AND CORONARY ANGIOGRAPHY;  Surgeon: Wendel Lurena POUR, MD;  Location: [facility];  Service: Cardiovascular;  Laterality: N/A;   LUMBAR LAMINECTOMY/DECOMPRESSION MICRODISCECTOMY Right 10/08/2015   Procedure: MICRO LUMBAR DECOMPRESSION L4-L5 AND L5-S1 ON RIGHT ;  Surgeon: Reyes Billing, MD;  Location: [facility] ORS;  Service: Orthopedics;  Laterality: Right;   MASS EXCISION  04/26/2011   Procedure: MINOR EXCISION OF MASS;  Surgeon: Krystal CHRISTELLA Spinner, MD;  Location: [facility];  Service: General;  Laterality: Right;  Excise subcutaneous mass right axilla Minor Room   MAZE N/A 08/18/2022   Procedure: MAZE USING ATRICURE EARNEY BEAGLE;  Surgeon: Murriel Toribio DEL, MD;  Location: [facility] OR;  Service: Open Heart Surgery;  Laterality: N/A;   POLYPECTOMY     PROSTATECTOMY  04/16/2005   TEE WITHOUT CARDIOVERSION N/A 08/18/2022   Procedure: TRANSESOPHAGEAL ECHOCARDIOGRAM;  Surgeon: Murriel Toribio DEL, MD;  Location: [facility] OR;  Service: Open Heart Surgery;  Laterality: N/A;   TONSILLECTOMY     TOTAL KNEE ARTHROPLASTY Left 07/03/2013   Procedure:  LEFT TOTAL KNEE ARTHROPLASTY;  Surgeon: Donnice JONETTA Car, MD;  Location: [facility] ORS;  Service: Orthopedics;  Laterality: Left;      [1]  Allergies Allergen Reactions   Atorvastatin Other (See Comments)    Increased LFTs     Rolinda Rogue, MD 05/16/24 1603  "

## 2024-05-16 NOTE — Telephone Encounter (Signed)
 Following up from 12/22 visit w you.. still having cough

## 2024-05-18 NOTE — Telephone Encounter (Signed)
Please advise as MD is out of office

## 2024-05-20 ENCOUNTER — Encounter (HOSPITAL_BASED_OUTPATIENT_CLINIC_OR_DEPARTMENT_OTHER): Payer: Self-pay

## 2024-05-20 ENCOUNTER — Emergency Department (HOSPITAL_BASED_OUTPATIENT_CLINIC_OR_DEPARTMENT_OTHER): Admitting: Radiology

## 2024-05-20 ENCOUNTER — Emergency Department (HOSPITAL_BASED_OUTPATIENT_CLINIC_OR_DEPARTMENT_OTHER)
Admission: EM | Admit: 2024-05-20 | Discharge: 2024-05-20 | Disposition: A | Discharge status: Home / Self Care | Attending: Emergency Medicine | Admitting: Emergency Medicine

## 2024-05-20 DIAGNOSIS — Z955 Presence of coronary angioplasty implant and graft: Secondary | ICD-10-CM | POA: Diagnosis not present

## 2024-05-20 DIAGNOSIS — R052 Subacute cough: Secondary | ICD-10-CM

## 2024-05-20 DIAGNOSIS — J101 Influenza due to other identified influenza virus with other respiratory manifestations: Secondary | ICD-10-CM | POA: Insufficient documentation

## 2024-05-20 DIAGNOSIS — Z7901 Long term (current) use of anticoagulants: Secondary | ICD-10-CM | POA: Insufficient documentation

## 2024-05-20 DIAGNOSIS — I2510 Atherosclerotic heart disease of native coronary artery without angina pectoris: Secondary | ICD-10-CM | POA: Diagnosis not present

## 2024-05-20 DIAGNOSIS — R059 Cough, unspecified: Secondary | ICD-10-CM | POA: Diagnosis present

## 2024-05-20 LAB — BASIC METABOLIC PANEL WITH GFR
Anion gap: 8 (ref 5–15)
BUN: 12 mg/dL (ref 8–23)
CO2: 27 mmol/L (ref 22–32)
Calcium: 9.8 mg/dL (ref 8.9–10.3)
Chloride: 100 mmol/L (ref 98–111)
Creatinine, Ser: 0.9 mg/dL (ref 0.61–1.24)
GFR, Estimated: >60 mL/min
Glucose, Bld: 97 mg/dL (ref 70–99)
Potassium: 4.3 mmol/L (ref 3.5–5.1)
Sodium: 134 mmol/L — ABNORMAL LOW (ref 135–145)

## 2024-05-20 LAB — CBC WITH DIFFERENTIAL/PLATELET
Abs Immature Granulocytes: 0.02 K/uL (ref 0.00–0.07)
Basophils Absolute: 0 K/uL (ref 0.0–0.1)
Basophils Relative: 0 %
Eosinophils Absolute: 0.1 K/uL (ref 0.0–0.5)
Eosinophils Relative: 1 %
HCT: 42.3 % (ref 39.0–52.0)
Hemoglobin: 14.6 g/dL (ref 13.0–17.0)
Immature Granulocytes: 0 %
Lymphocytes Relative: 29 %
Lymphs Abs: 1.3 K/uL (ref 0.7–4.0)
MCH: 33.1 pg (ref 26.0–34.0)
MCHC: 34.5 g/dL (ref 30.0–36.0)
MCV: 95.9 fL (ref 80.0–100.0)
Monocytes Absolute: 0.9 K/uL (ref 0.1–1.0)
Monocytes Relative: 19 %
Neutro Abs: 2.3 K/uL (ref 1.7–7.7)
Neutrophils Relative %: 51 %
Platelets: 142 K/uL — ABNORMAL LOW (ref 150–400)
RBC: 4.41 MIL/uL (ref 4.22–5.81)
RDW: 13.5 % (ref 11.5–15.5)
WBC: 4.6 K/uL (ref 4.0–10.5)
nRBC: 0 % (ref 0.0–0.2)

## 2024-05-20 LAB — RESP PANEL BY RT-PCR (RSV, FLU A&B, COVID)  RVPGX2
Influenza A by PCR: POSITIVE — AB
Influenza B by PCR: NEGATIVE
Resp Syncytial Virus by PCR: NEGATIVE
SARS Coronavirus 2 by RT PCR: NEGATIVE

## 2024-05-20 LAB — LACTIC ACID, PLASMA: Lactic Acid, Venous: 0.7 mmol/L (ref 0.5–1.9)

## 2024-05-20 NOTE — ED Provider Notes (Signed)
 " [place] EMERGENCY DEPARTMENT AT [facility] Provider Note   CSN: [id number] Arrival date & time: 05/20/24  9257     History Chief Complaint  Patient presents with   Persistent cough    HPI: Jeremy Waters is a 78 y.o. male with history pertinent for atrial flutter, prior heart valve replacement, CAD, prior CABG, HCL who presents complaining of persistent cough.. Patient arrived via POV accompanied by wife.  History provided by patient and spouse/partner.  No interpreter required during this encounter.  Patient reports that he first developed cough, congestion on 12/22.  Reports that he went to see urgent care and he was negative for COVID/flu, was prescribed benzonatate , which did not alleviate his cough.  Reports that since then he has been back to his primary care doctor, urgent care doctor as well as [facility], ED several times for persistent cough.  Reports that cough is his primary symptoms of concern because of his poor sleep quality while having his cough.  Reports that he was prescribed hydrocodone  cough syrup at [facility], however this is also not providing significant relief.  He had poor sleep quality last night, therefore he wanted to come back to the emergency department to get reevaluated.  Does report that he has some yellow sputum in his cough.  Denies fever, chills, chest pain.  Does have a history of a artificial heart valve that was placed approximately 18 months ago per wife.  No history of COPD, asthma, oxygen use.  Patient's recorded medical, surgical, social, medication list and allergies were reviewed in the Snapshot window as part of the initial history.   Prior to Admission medications  Medication Sig Start Date End Date Taking? Authorizing Provider  apixaban  (ELIQUIS ) 5 MG TABS tablet Take 1 tablet (5 mg total) by mouth 2 (two) times daily. Please begin medication on 02/13/2024 which is 4 weeks prior to your ablation. 02/08/24   Mealor, Eulas BRAVO, MD   Ascorbic Acid  (VITAMIN C ) 1000 MG tablet Take 1,000 mg by mouth 2 (two) times daily.    [provider]  benzonatate  (TESSALON ) 100 MG capsule Take 1 capsule (100 mg total) by mouth every 8 (eight) hours. 05/14/24   Henson, Vickie L, NP-C  beta carotene  25000 UNIT capsule Take 25,000 Units by mouth daily.    [provider]  clonazePAM  (KLONOPIN ) 0.5 MG tablet Take 1 tablet (0.5 mg total) by mouth at bedtime as needed for anxiety. 03/21/24   [name] PARAS, MD  Evolocumab  (REPATHA  SURECLICK) 140 MG/ML SOAJ Inject 140 mg into the skin every 14 (fourteen) days. 11/28/23   Patwardhan, Newman PARAS, MD  fluticasone  (FLONASE ) 50 MCG/ACT nasal spray SPRAY 1 SPRAY IN EACH NOSTRIL ONCE DAILY 10/31/23   Burns, Glade PARAS, MD  HYDROcodone  bit-homatropine (HYCODAN) 5-1.5 MG/5ML syrup Take 2.5-5 mLs by mouth every 6 (six) hours as needed for cough. 05/16/24   Rolinda Rogue, MD  levothyroxine  (SYNTHROID ) 50 MCG tablet TAKE 1 TABLET BY MOUTH DAILY 03/19/24   [name] PARAS, MD  metoprolol  succinate (TOPROL  XL) 25 MG 24 hr tablet Take 1 tablet (25 mg total) by mouth at bedtime. 12/29/23   Patwardhan, Newman PARAS, MD  pyridOXINE  (VITAMIN B-6) 100 MG tablet Take 100 mg by mouth daily.    [provider]  rosuvastatin  (CRESTOR ) 40 MG tablet TAKE 1 TABLET BY MOUTH DAILY 02/27/24   [name] PARAS, MD  triamcinolone  cream (KENALOG) 0.1 % Apply 1 Application topically daily as needed (Skin rash  on legs). 12/26/23   [provider]  vitamin E 400 UNIT capsule Take 400 Units by mouth daily.    [provider]     Allergies: Atorvastatin   Review of Systems   ROS as per HPI  Physical Exam Updated Vital Signs BP 123/73   Pulse (!) 57   Temp 98 F (36.7 C)   Resp 14   SpO2 94%  Physical Exam Vitals and nursing note reviewed.  Constitutional:      General: He is not in acute distress.    Appearance: Normal appearance. He is well-developed.  HENT:     Head: Normocephalic and  atraumatic.     Nose: Congestion present.  Eyes:     Extraocular Movements: Extraocular movements intact.     Conjunctiva/sclera: Conjunctivae normal.  Cardiovascular:     Rate and Rhythm: Normal rate and regular rhythm.     Pulses: Normal pulses.     Heart sounds: No murmur heard. Pulmonary:     Effort: Pulmonary effort is normal. No respiratory distress.     Breath sounds: Normal breath sounds. No wheezing or rhonchi.  Abdominal:     Palpations: Abdomen is soft.     Tenderness: There is no abdominal tenderness.  Musculoskeletal:        General: No swelling.     Cervical back: Neck supple.  Skin:    General: Skin is warm and dry.     Capillary Refill: Capillary refill takes less than 2 seconds.  Neurological:     General: No focal deficit present.     Mental Status: He is alert and oriented to person, place, and time.  Psychiatric:        Mood and Affect: Mood normal.     ED Course/ Medical Decision Making/ A&P    Procedures Procedures   Medications Ordered in ED Medications - No data to display  Medical Decision Making:   Jeremy Waters is a 78 y.o. male who presents for persistent cough as per above.  Physical exam is pertinent for congestion, notably no wheezing, rhonchi, respiratory distress.   The differential includes but is not limited to sepsis, pneumonia, viral illness, bronchitis.  Independent historian: Spouse/partner  External data reviewed: Notes: Reviewed prior notes and labs  Initial Plan:  Screening labs including CBC and Metabolic panel to evaluate for infectious or metabolic etiology of disease.  Pain and lactic to evaluate for sepsis COVID/flu/RSV Chest x-ray to evaluate for structural/infectious intra-thoracic pathology.  EKG to evaluate for cardiac pathology Objective evaluation as below reviewed   Labs: Ordered, Independent interpretation, and Details: RVP positive for flu A. CBC without leukocytosis, anemia.  Mild nonspecific  thrombocytopenia, absolute value similar in comparison to prior.  BMP without AKI or emergent electrolyte derangement.  Lactic acid WNL.  Radiology: Ordered, Independent interpretation, Details: Chest x-ray without focal airspace opacification, cardiomediastinal silhouette derangement, pneumothorax, pleural effusion, bony derangement., and All images reviewed independently.  Agree with radiology report at this time.   DG Chest 2 View Result Date: 05/20/2024 CLINICAL DATA:  Cough.  Influenza. EXAM: CHEST - 2 VIEW COMPARISON:  05/01/2024 FINDINGS: The heart size and mediastinal contours are within normal limits. Prior CABG and aortic valve replacement again noted. Stable mild lingular scarring. Both lungs are otherwise clear. The visualized skeletal structures are unremarkable. IMPRESSION: No active cardiopulmonary disease. Electronically Signed   By: Norleen DELENA Kil M.D.   On: 05/20/2024 10:10   DG Chest 2 View Result Date: 05/01/2024 CLINICAL DATA:  Shortness of breath with cough and congestion. EXAM: CHEST - 2 VIEW COMPARISON:  03/21/2024 FINDINGS: The lungs are clear without focal pneumonia, edema, pneumothorax or pleural effusion. The cardiopericardial silhouette is within normal limits for size. No acute bony abnormality. Status post cardiac valve replacement with left atrial appendage excluder device evident. IMPRESSION: No active cardiopulmonary disease. Electronically Signed   By: Camellia Candle M.D.   On: 05/01/2024 09:11    EKG/Medicine tests: Ordered and Independent interpretation EKG Interpretation: Sinus rhythm Incomplete right bundle branch block , known Confirmed by Rogelia Satterfield ([id number]) on 05/20/2024 10:18:25 AM  Interventions: None  See the EMR for full details regarding lab and imaging results.  Patient presents for persistent cough interrupting his sleep quality in the setting of recent influenza diagnosis.  Patient is well-appearing on exam, no wheezing, no increased work of  breathing.  Patient does report productive cough, has been recently diagnosed with influenza.  Has trialed Tessalon  Perles as well as hydrocodone  cough syrup without sufficient relief.  Discussed with patient and wife at bedside that there are no good cough medications, discussed that most over-the-counter medications have diphenhydramine  which can increase risk of anticholinergic side effects particularly in older adults, discussed that Tessalon  Perles as well as hydrocodone  are reasonable medications that can be trialed for severe cough, however given he has trialed both of these without improvement, there are not additional good prescription options for cough.  Discussed postviral bronchitis can be prolonged in duration.  Given patient's age, history of heart valve, persistent productive cough, do feel that it is reasonable to obtain chest x-ray as well as screening labs to further evaluate for etiology such as pneumonia.  This workup reveals no evidence of pneumonia.  Given patient is vitally stable, well-appearing, reassuring labs, feel that patient is stable for discharge and outpatient management, discussed prolonged course of postviral cough, and recommended continued follow-up with PCP.SABRA  Presentation is most consistent with acute complicated illness and I did consider and rule out acute life/limb-threatening illness  Discussion of management or test interpretations with external provider(s): Not indicated  Risk Drugs:None  Disposition: DISCHARGE: I believe that the patient is safe for discharge home with outpatient follow-up. Patient was informed of all pertinent physical exam, laboratory, and imaging findings. Patient's suspected etiology of their symptom presentation was discussed with the patient and all questions were answered. We discussed following up with PCP. I provided thorough ED return precautions. The patient feels safe and comfortable with this plan.  MDM generated using voice  dictation software and may contain dictation errors.  Please contact me for any clarification or with any questions.  Clinical Impression:  1. Subacute cough      Data Unavailable   Final Clinical Impression(s) / ED Diagnoses Final diagnoses:  Subacute cough    Rx / DC Orders ED Discharge Orders     None        Rogelia Satterfield RAMAN, MD 05/20/24 1202  "

## 2024-05-20 NOTE — Discharge Instructions (Signed)
 Jeremy Waters  Thank you for allowing us  to take care of you today.  You came to the Emergency Department today because you were recently diagnosed with flu and you have had a persistent cough that has been disruptive to your sleep, you have tried benzonatate  as well as hydrocodone  syrup and these have not sufficiently controlled your symptoms.  Unfortunately there are not many good cough medications out there, these are good to cough medications that I would have considered, so there are no additional good options given these medications are not working well for you.  The over-the-counter medications to help you sleep through your cough at night, however have increased risk of urinary retention, hallucinations, and falls particularly with older adults.  Here in the emergency department you do not have abnormal breathing, pneumonia, and your blood work looks good, therefore you are safe to follow-up with your primary care doctor, unfortunately sometimes viruses can cause inflammation in your lungs that can lead to a chronic cough for several weeks even after the rest of the viral symptoms have improved, and there is no good medication to stop this.  We recommend continued care with your primary care doctor, and return to the emergency department if you have any new or worsening symptoms.  To-Do: 1. Please follow-up with your primary doctor within 1 - 2 weeks / as soon as possible.   Please return to the Emergency Department or call 911 if you experience have worsening of your symptoms, or do not get better, chest pain, shortness of breath, severe or significantly worsening pain, high fever, severe confusion, pass out or have any reason to think that you need emergency medical care.   We hope you feel better soon.   Mitzie Later, MD Department of Emergency Medicine [facility]

## 2024-05-20 NOTE — ED Triage Notes (Signed)
 He states he tested positive for flu over a week ago. He c/o nagging cough. He is in no distress.

## 2024-05-20 NOTE — ED Notes (Signed)
 DC paperwork given and verbally understood.

## 2024-05-22 ENCOUNTER — Telehealth: Payer: Self-pay

## 2024-05-22 NOTE — Telephone Encounter (Signed)
 Spoke with patient today.

## 2024-05-28 ENCOUNTER — Telehealth: Payer: Self-pay

## 2024-05-28 ENCOUNTER — Emergency Department (HOSPITAL_BASED_OUTPATIENT_CLINIC_OR_DEPARTMENT_OTHER): Admitting: Radiology

## 2024-05-28 ENCOUNTER — Encounter (HOSPITAL_BASED_OUTPATIENT_CLINIC_OR_DEPARTMENT_OTHER): Payer: Self-pay | Admitting: *Deleted

## 2024-05-28 ENCOUNTER — Ambulatory Visit: Payer: Self-pay

## 2024-05-28 ENCOUNTER — Other Ambulatory Visit: Payer: Self-pay

## 2024-05-28 DIAGNOSIS — R059 Cough, unspecified: Secondary | ICD-10-CM | POA: Diagnosis present

## 2024-05-28 DIAGNOSIS — Z5321 Procedure and treatment not carried out due to patient leaving prior to being seen by health care provider: Secondary | ICD-10-CM | POA: Insufficient documentation

## 2024-05-28 NOTE — ED Provider Triage Note (Signed)
 Emergency Medicine Provider Triage Evaluation Note  Jeremy Waters , a 79 y.o. male  was evaluated in triage.  Pt complains of flu last month, seen at PCP, here today for ongoing cough, no SHOB.  Review of Systems  Positive: Cough (yellow clumps at times) Negative: SHOB, CP, fever, headache  Physical Exam  BP 117/79 (BP Location: Right Arm)   Pulse 71   Temp 98 F (36.7 C)   Resp 17   SpO2 97%  Gen:   Awake, no distress   Resp:  Normal effort  MSK:   Moves extremities without difficulty  Other:    Medical Decision Making  Medically screening exam initiated at 1:10 PM.  Appropriate orders placed.  Jeremy Waters was informed that the remainder of the evaluation will be completed by another provider, this initial triage assessment does not replace that evaluation, and the importance of remaining in the ED until their evaluation is complete.     Beverley Leita LABOR, PA-C 05/28/24 1311

## 2024-05-28 NOTE — ED Triage Notes (Signed)
 Pt was dx with flu in December and continues to to have a cough and was told to come to ER for CXR>  no sob.

## 2024-05-28 NOTE — Telephone Encounter (Signed)
" ° ° °  Message from Antwanette L sent at 05/28/2024  8:25 AM EST  Reason for Triage: The patient is coughing up clear/yellow mucus, has no fever, oxygen level is 96%, and reports fatigue. He was seen on 12/22 by Boby Mackintosh at [facility]. The patient would like to know whether he needs to schedule an appointment or if medication can be prescribed. Please follow up with the patient at  [phone]   Reason for Disposition  [1] HIGH RISK  (e.g., weak immune system, 65 years and older, obesity with BMI 40 or higher, pregnant, chronic lung disease) AND [2] evaluated by doctor (or NP/PA) > 72 hours (3 days) ago AND [3] symptoms not BETTER (improved)  Answer Assessment - Initial Assessment Questions Patient reports 1-2 week hx of persisting productive cough post Flu A dx. Denies fever. States SPO2 is 96-97%. States is taking the hycodan at night but it is causing him insomnia, but has run out of tessalon  perles. Patient states he hears wheezing when coughing or when supine.   Pt advised UC or ED as no same day OV available. Pt agreeable to UC. ED precautions reviewed, pt verbalized understanding.  05/14/24: NP Henson saw for URI, tx mucinex, tessalon  perles, tylenol   05/16/24: UC, ordered hycodan. Positive Flu A.  05/20/24: ED advised continue tessalon  perles and hycodan. Positive Flu A.   1. DIAGNOSIS CONFIRMATION: When was the influenza diagnosed? By whom? Did you get a test for it?     05/16/24 2. INFLUENZA MEDICINES: Were you prescribed any medicines for the influenza?  (e.g., zanamivir [Relenza], oseltamivir [Tamiflu]).      No 3. SYMPTOMS: What is your main symptom or concern? (e.g., cough, fever, shortness of breath, muscle aches)     Cough with wheezing 4. ONSET: When did the symptoms start?      05/14/24 5. COUGH: Do you have a cough? If Yes, ask: How bad is the cough?       Yes, states severe at night 6. FEVER: Do you have a fever? If Yes, ask: What is your  temperature, how was it measured, and when did it start?     Denies 7. BREATHING DIFFICULTY: Are you having any difficulty breathing? (e.g., normal; shortness of breath, wheezing, unable to speak)      Denies, but hears wheezing 9. HIGH RISK FOR COMPLICATIONS: Do you have any chronic medical problems? (e.g., asthma, heart or lung disease, obesity, weak immune system)     Yes - Afib, CAD 11. O2 SATURATION MONITOR:  Do you use an oxygen saturation monitor (pulse oximeter) at home? If Yes, ask What is your reading (oxygen level) today? What is your usual oxygen saturation reading? (e.g., 95%)       96-97% RA  Protocols used: Influenza (Flu) Follow-up Call-A-AH  Past Medical History:  Diagnosis Date   Adenomatous colon polyp 09/2007   Arthritis    OA / PAIN LEFT KNEE   Atrial fibrillation (HCC) 08/25/2022   Atrial flutter (HCC) 10/02/2022   Cancer (HCC)    prostate cancer   Cataract    removed both eyes   Coronary artery disease    COVID-19 virus infection    Diverticulosis of colon    w/o hemorrage   Heart murmur    TOLD HE HAS A SLIGHT MURMUR   Hyperlipidemia    Inguinal hernia    left side    Thyroid  disease     "

## 2024-05-28 NOTE — Telephone Encounter (Signed)
 Copied from CRM [id number]. Topic: Clinical - Request for Lab/Test Order >> May 28, 2024 11:28 AM Herma G wrote: Reason for CRM: Pt requested a call back ASAP at [phone] to discuss another location to get a chest X-Ray done as he states that the one where he was initially sent to could not take him in.

## 2024-05-28 NOTE — Telephone Encounter (Signed)
 FYI Only or Action Required?: FYI only for provider: appointment scheduled on 05/29/24.  Patient was last seen in primary care on 05/14/2024 by Lendia Boby CROME, NP-C.  Called Nurse Triage reporting Cough.  Symptoms began x 2 weeks.  Interventions attempted: OTC medications: Cough drops.  Symptoms are: unchanged.  Triage Disposition: See HCP Within 4 Hours (Or PCP Triage)  Patient/caregiver understands and will follow disposition?: Yes                               Copied from CRM #[id number]. Topic: Clinical - Red Word Triage >> May 28, 2024  3:53 PM Deleta RAMAN wrote: Red Word that prompted transfer to Nurse Triage: nt earlier was not able to get assistance at urgent care Reason for Disposition  Wheezing is present  Answer Assessment - Initial Assessment Questions 1. ONSET: When did the cough begin?      X 2 weeks   2. SEVERITY: How bad is the cough today?      Intermittent   3. SPUTUM: Describe the color of your sputum (e.g., none, dry cough; clear, white, yellow, green)     Yellow   4. HEMOPTYSIS: Are you coughing up any blood? If Yes, ask: How much? (e.g., flecks, streaks, tablespoons, etc.)     No   5. DIFFICULTY BREATHING: Are you having difficulty breathing? If Yes, ask: How bad is it? (e.g., mild, moderate, severe)      No   6. FEVER: Do you have a fever? If Yes, ask: What is your temperature, how was it measured, and when did it start?     No   7. CARDIAC HISTORY: Do you have any history of heart disease? (e.g., heart attack, congestive heart failure)      CAD, CHF   8. LUNG HISTORY: Do you have any history of lung disease?  (e.g., pulmonary embolus, asthma, emphysema)     No   10. OTHER SYMPTOMS: Do you have any other symptoms? (e.g., runny nose, wheezing, chest pain)       Wheezing 30 minutes- not current     Patient called in to triage with complaints of Cough  This has been ongoing for 2 weeks. The  patient stated he spoke with triage earlier today and was directed to UC, he did not wish to wait to be seen by provider, d/t wait time. He then went to ED today, and x-ray showed lungs were normal but he then left due to hour wait time; and was not seen by provider in the ED. SABRA    For home care, the patient is taking cough drops.   Appointment scheduled for further evaluation; Patient agrees with the plan of care, and will reach out if symptoms worsen or persist.  Protocols used: Cough - Acute Productive-A-AH

## 2024-05-29 ENCOUNTER — Ambulatory Visit: Admitting: Internal Medicine

## 2024-05-29 ENCOUNTER — Encounter: Payer: Self-pay | Admitting: Internal Medicine

## 2024-05-29 VITALS — BP 124/80 | HR 67 | Temp 98.4°F | Ht 67.0 in | Wt 151.0 lb

## 2024-05-29 DIAGNOSIS — R058 Other specified cough: Secondary | ICD-10-CM | POA: Diagnosis not present

## 2024-05-29 DIAGNOSIS — I5032 Chronic diastolic (congestive) heart failure: Secondary | ICD-10-CM

## 2024-05-29 MED ORDER — PROMETHAZINE-DM 6.25-15 MG/5ML PO SYRP: 5.0000 mL | ORAL_SOLUTION | Freq: Four times a day (QID) | ORAL | 0 refills | Status: DC | PRN

## 2024-05-29 MED ORDER — PREDNISONE 10 MG PO TABS: ORAL_TABLET | ORAL | 0 refills | Status: DC

## 2024-05-29 MED ORDER — ALBUTEROL SULFATE HFA 108 (90 BASE) MCG/ACT IN AERS: 2.0000 | INHALATION_SPRAY | Freq: Four times a day (QID) | RESPIRATORY_TRACT | 0 refills | Status: DC | PRN

## 2024-05-29 MED ORDER — AZITHROMYCIN 250 MG PO TABS: ORAL_TABLET | ORAL | 1 refills | Status: AC

## 2024-05-29 NOTE — Assessment & Plan Note (Addendum)
 Mild to mod and can't sleep and miserable with his third health visit in 3 wks, now  with cough, congestion and possible wheezes - now for antibx course zpack, prednisone  taper, cough med prn, and albuterol  hfa prn,  to f/u any worsening symptoms or concerns

## 2024-05-29 NOTE — Patient Instructions (Signed)
Please take all new medication as prescribed - the antibiotic, cough medicine, prednisone and inhaler as needed ° °Please continue all other medications as before, and refills have been done if requested. ° °Please have the pharmacy call with any other refills you may need. ° °Please keep your appointments with your specialists as you may have planned ° ° ° ° °

## 2024-05-29 NOTE — Telephone Encounter (Signed)
 Spoke with patient today and he was able to get xray done yesterday.  He is coming in to see Dr. Norleen this afternoon for follow up.

## 2024-05-29 NOTE — Assessment & Plan Note (Signed)
Volume stable, cont current med tx

## 2024-05-29 NOTE — Progress Notes (Signed)
 Patient ID: Jeremy Waters, male   DOB: 1946/04/06, 79 y.o.   MRN: [id number]        Chief Complaint: follow up productive cough and congested, sob       HPI:  Jeremy Waters is a 79 y.o. male here with 3 wks illness it seems with documented Flu just prior to [facility] but was too late for tamiflu.  Has overall done ok, but now in the past wk seems worse again with worsening productivity and persistent cough and congestion maybe mild wheezing even where he just can't sleep at night.  Pt denies chest pain, orthopnea, PND, increased LE swelling, palpitations, dizziness or syncope.  Was seen at [facility] yesterday with CXR - NAD.    Wt Readings from Last 3 Encounters:  05/29/24 151 lb (68.5 kg)  05/14/24 159 lb (72.1 kg)  05/02/24 158 lb 6.4 oz (71.8 kg)   BP Readings from Last 3 Encounters:  05/29/24 124/80  05/28/24 117/79  05/20/24 122/71         Past Medical History:  Diagnosis Date   Adenomatous colon polyp 09/2007   Arthritis    OA / PAIN LEFT KNEE   Atrial fibrillation (HCC) 08/25/2022   Atrial flutter (HCC) 10/02/2022   Cancer (HCC)    prostate cancer   Cataract    removed both eyes   Coronary artery disease    COVID-19 virus infection    Diverticulosis of colon    w/o hemorrage   Heart murmur    TOLD HE HAS A SLIGHT MURMUR   Hyperlipidemia    Inguinal hernia    left side    Thyroid  disease    Past Surgical History:  Procedure Laterality Date   A-FLUTTER ABLATION N/A 03/12/2024   Procedure: A-FLUTTER ABLATION;  Surgeon: Nancey Eulas BRAVO, MD;  Location: [facility] INVASIVE CV LAB;  Service: Cardiovascular;  Laterality: N/A;   AORTIC VALVE REPLACEMENT N/A 08/18/2022   Procedure: AORTIC VALVE REPLACEMENT (AVR) USING EDWARDS INSPIRIS AORTIC VALVE SIZE ;  Surgeon: Murriel Toribio DEL, MD;  Location: [facility] OR;  Service: Open Heart Surgery;  Laterality: N/A;   ATRIAL FIBRILLATION ABLATION N/A 03/12/2024   Procedure: ATRIAL FIBRILLATION ABLATION;  Surgeon: Nancey Eulas BRAVO, MD;  Location: [facility]  INVASIVE CV LAB;  Service: Cardiovascular;  Laterality: N/A;   BACK SURGERY  05/12/2005   L5   CARDIAC CATHETERIZATION     CLIPPING OF ATRIAL APPENDAGE N/A 08/18/2022   Procedure: CLIPPING OF ATRIAL APPENDAGE USING ATRICURE 45 ATRICLIP;  Surgeon: Murriel Toribio DEL, MD;  Location: [facility] OR;  Service: Open Heart Surgery;  Laterality: N/A;  Median Sternotomy   COLONOSCOPY  1999   Negative; Dr Aneita   COLONOSCOPY  2004   tics, hemorrhoids   COLONOSCOPY  2009   polyps (T.A.)   CORONARY ARTERY BYPASS GRAFT N/A 08/18/2022   Procedure: CORONARY ARTERY BYPASS GRAFTING (CABG) X2 USING LEFT INTERNAL MAMMARY ARTERY AND LEFT ENDOSCOPICALLY HARVESTED GREATER SAPHENOUS VEIN.;  Surgeon: Murriel Toribio DEL, MD;  Location: [facility] OR;  Service: Open Heart Surgery;  Laterality: N/A;   HERNIA REPAIR  1987   HERNIA REPAIR  10/2003   KNEE SURGERY  1969   Patellar fracture fragment Lt   LAMINECTOMY  2000   L4-5   LEFT HEART CATH AND CORONARY ANGIOGRAPHY N/A 08/16/2022   Procedure: LEFT HEART CATH AND CORONARY ANGIOGRAPHY;  Surgeon: Wendel Lurena POUR, MD;  Location: [facility] INVASIVE CV LAB;  Service: Cardiovascular;  Laterality: N/A;   LUMBAR LAMINECTOMY/DECOMPRESSION MICRODISCECTOMY Right  10/08/2015   Procedure: MICRO LUMBAR DECOMPRESSION L4-L5 AND L5-S1 ON RIGHT ;  Surgeon: Reyes Billing, MD;  Location: [facility] ORS;  Service: Orthopedics;  Laterality: Right;   MASS EXCISION  04/26/2011   Procedure: MINOR EXCISION OF MASS;  Surgeon: Krystal CHRISTELLA Spinner, MD;  Location: [facility];  Service: General;  Laterality: Right;  Excise subcutaneous mass right axilla Minor Room   MAZE N/A 08/18/2022   Procedure: MAZE USING ATRICURE EARNEY BEAGLE;  Surgeon: Murriel Toribio DEL, MD;  Location: [facility] OR;  Service: Open Heart Surgery;  Laterality: N/A;   POLYPECTOMY     PROSTATECTOMY  04/16/2005   TEE WITHOUT CARDIOVERSION N/A 08/18/2022   Procedure: TRANSESOPHAGEAL ECHOCARDIOGRAM;  Surgeon: Murriel Toribio DEL, MD;  Location: [facility] OR;  Service: Open  Heart Surgery;  Laterality: N/A;   TONSILLECTOMY     TOTAL KNEE ARTHROPLASTY Left 07/03/2013   Procedure: LEFT TOTAL KNEE ARTHROPLASTY;  Surgeon: Donnice JONETTA Car, MD;  Location: [facility] ORS;  Service: Orthopedics;  Laterality: Left;    reports that he quit smoking about 43 years ago. His smoking use included cigarettes. He has never used smokeless tobacco. He reports current alcohol use of about 7.0 - 14.0 standard drinks of alcohol per week. He reports that he does not use drugs. family history includes Alcohol abuse in his paternal uncle; COPD in his mother; Cancer in his father; Colon cancer (age of onset: 81) in his father; Hypertension in his paternal uncle; Lung cancer in his mother and sister; Prostate cancer in his father; Stroke (age of onset: 23) in his paternal uncle; Urolithiasis in his father. Allergies[1] Medications Ordered Prior to Encounter[2]      ROS:  All others reviewed and negative.  Objective        PE:  BP 124/80 (BP Location: Right Arm, Patient Position: Sitting, Cuff Size: Normal)   Pulse 67   Temp 98.4 F (36.9 C) (Oral)   Ht 5' 7 (1.702 m)   Wt 151 lb (68.5 kg)   SpO2 97%   BMI 23.65 kg/m                 Constitutional: Pt appears in NAD               HENT: Head: NCAT.                Right Ear: External ear normal.                 Left Ear: External ear normal.                Eyes: . Pupils are equal, round, and reactive to light. Conjunctivae and EOM are normal               Nose: without d/c or deformity               Neck: Neck supple. Gross normal ROM               Cardiovascular: Normal rate and regular rhythm.                 Pulmonary/Chest: Effort normal and breath sounds without rales or wheezing.                Abd:  Soft, NT, ND, + BS, no organomegaly               Neurological: Pt is alert. At baseline orientation, motor grossly intact  Skin: Skin is warm. No rashes, no other new lesions, LE edema - none               Psychiatric: Pt  behavior is normal without agitation   Micro: none  Cardiac tracings I have personally interpreted today:  none  Pertinent Radiological findings (summarize): none   Lab Results  Component Value Date   WBC 4.6 05/20/2024   HGB 14.6 05/20/2024   HCT 42.3 05/20/2024   PLT 142 (L) 05/20/2024   GLUCOSE 97 05/20/2024   CHOL 116 03/07/2024   TRIG 72.0 03/07/2024   HDL 74.30 03/07/2024   LDLDIRECT 179.6 01/04/2013   LDLCALC 27 03/07/2024   ALT 20 03/07/2024   AST 31 03/07/2024   NA 134 (L) 05/20/2024   K 4.3 05/20/2024   CL 100 05/20/2024   CREATININE 0.90 05/20/2024   BUN 12 05/20/2024   CO2 27 05/20/2024   TSH 1.830 03/21/2024   INR 1.7 (H) 08/18/2022   HGBA1C 5.4 03/17/2023   Assessment/Plan:  Jeremy Waters is a 79 y.o. White or Caucasian [1] male with  has a past medical history of Adenomatous colon polyp (09/2007), Arthritis, Atrial fibrillation (HCC) (08/25/2022), Atrial flutter (HCC) (10/02/2022), Cancer (HCC), Cataract, Coronary artery disease, COVID-19 virus infection, Diverticulosis of colon, Heart murmur, Hyperlipidemia, Inguinal hernia, and Thyroid  disease.  Productive cough Mild to mod and can't sleep and miserable with his third health visit in 3 wks, now  with cough, congestion and possible wheezes - now for antibx course zpack, prednisone  taper, cough med prn, and albuterol  hfa prn,  to f/u any worsening symptoms or concerns  Chronic heart failure with preserved ejection fraction (HFpEF) (HCC) Volume stable, cont current med tx  Followup: Return if symptoms worsen or fail to improve.  Lynwood Rush, MD 05/29/2024 2:14 PM [facility]     [1]  Allergies Allergen Reactions   Atorvastatin Other (See Comments)    Increased LFTs  [2]  Current Outpatient Medications on File Prior to Visit  Medication Sig Dispense Refill   apixaban  (ELIQUIS ) 5 MG TABS tablet Take 1 tablet (5 mg total) by mouth 2  (two) times daily. Please begin medication on 02/13/2024 which is 4 weeks prior to your ablation. 60 tablet 5   Ascorbic Acid  (VITAMIN C ) 1000 MG tablet Take 1,000 mg by mouth 2 (two) times daily.     benzonatate  (TESSALON ) 100 MG capsule Take 1 capsule (100 mg total) by mouth every 8 (eight) hours. 21 capsule 0   beta carotene  25000 UNIT capsule Take 25,000 Units by mouth daily.     clonazePAM  (KLONOPIN ) 0.5 MG tablet Take 1 tablet (0.5 mg total) by mouth at bedtime as needed for anxiety. 30 tablet 2   Evolocumab  (REPATHA  SURECLICK) 140 MG/ML SOAJ Inject 140 mg into the skin every 14 (fourteen) days. 6 mL 3   fluticasone  (FLONASE ) 50 MCG/ACT nasal spray SPRAY 1 SPRAY IN EACH NOSTRIL ONCE DAILY 16 mL 3   HYDROcodone  bit-homatropine (HYCODAN) 5-1.5 MG/5ML syrup Take 2.5-5 mLs by mouth every 6 (six) hours as needed for cough. 90 mL 0   levothyroxine  (SYNTHROID ) 50 MCG tablet TAKE 1 TABLET BY MOUTH DAILY 90 tablet 2   metoprolol  succinate (TOPROL  XL) 25 MG 24 hr tablet Take 1 tablet (25 mg total) by mouth at bedtime. 90 tablet 3   pyridOXINE  (VITAMIN B-6) 100 MG tablet Take 100 mg by mouth daily.  rosuvastatin  (CRESTOR ) 40 MG tablet TAKE 1 TABLET BY MOUTH DAILY 90 tablet 1   triamcinolone  cream (KENALOG) 0.1 % Apply 1 Application topically daily as needed (Skin rash on legs).     vitamin E 400 UNIT capsule Take 400 Units by mouth daily.     No current facility-administered medications on file prior to visit.

## 2024-06-04 ENCOUNTER — Other Ambulatory Visit (HOSPITAL_COMMUNITY): Payer: Self-pay

## 2024-06-06 ENCOUNTER — Encounter: Payer: Self-pay | Admitting: Cardiovascular Disease

## 2024-06-14 ENCOUNTER — Other Ambulatory Visit: Payer: Self-pay | Admitting: Internal Medicine

## 2024-06-20 ENCOUNTER — Ambulatory Visit: Attending: Cardiovascular Disease | Admitting: Cardiovascular Disease

## 2024-06-20 ENCOUNTER — Encounter: Payer: Self-pay | Admitting: Cardiovascular Disease

## 2024-06-20 VITALS — BP 122/68 | HR 75 | Ht 67.0 in | Wt 156.0 lb

## 2024-06-20 DIAGNOSIS — I4819 Other persistent atrial fibrillation: Secondary | ICD-10-CM

## 2024-06-20 DIAGNOSIS — I493 Ventricular premature depolarization: Secondary | ICD-10-CM | POA: Diagnosis not present

## 2024-06-20 DIAGNOSIS — I4892 Unspecified atrial flutter: Secondary | ICD-10-CM

## 2024-06-20 NOTE — Patient Instructions (Signed)
 Medication Instructions:  Your physician has recommended you make the following change in your medication:   ** Stop Eliquis   *If you need a refill on your cardiac medications before your next appointment, please call your pharmacy*  Lab Work: None ordered If you have labs (blood work) drawn today and your tests are completely normal, you will receive your results only by: MyChart Message (if you have MyChart) OR A paper copy in the mail If you have any lab test that is abnormal or we need to change your treatment, we will call you to review the results.  Testing/Procedures: None ordered.   Follow-Up: At [facility], you and your health needs are our priority.  As part of our continuing mission to provide you with exceptional heart care, our providers are all part of one team.  This team includes your primary Cardiologist (physician) and Advanced Practice Providers or APPs (Physician Assistants and Nurse Practitioners) who all work together to provide you with the care you need, when you need it.  Your next appointment:   Follow up with [facility] in one year

## 2024-06-20 NOTE — Progress Notes (Signed)
 " Electrophysiology Office Note:    Date:  06/20/2024   ID:  Jeremy Waters, DOB 09/02/1945, MRN [id number]  PCP:  [name] PARAS, MD   [facility] Providers Cardiologist:  [name], MD     Referring MD: [name] PARAS, MD   History of Present Illness:    Jeremy Waters is a 79 y.o. male with a medical history significant for a history of paroxysmal atrial fibrillation ,CABG and surgical AVR with MAZE procedure and left atrial appendage ligation, referred for AF management.      Discussed the use of AI scribe software for clinical note transcription with the patient, who gave verbal consent to proceed.  History of Present Illness Jeremy Waters is a 79 year old male with atrial fibrillation who presents for evaluation of recurrent arrhythmia. He was referred by Dr. Bruna Bright to discuss ways to manage his atrial fibrillation.  In June 2021, he first noted atrial fibrillation on his Apple Watch, accompanied by fatigue. Despite heart rates reaching up to 150 beats per minute, he did not experience any symptoms at that time. Prior to his admission in June, he had three weeks of increasing fatigue and exercise intolerance, impacting his ability to maintain his usual 40-minute exercise routine.  In March 2024, he underwent bypass aortic valve replacement and left atrial appendage occlusion. A CT scan confirmed the occlusion. Initially, he was on Eliquis , which was discontinued after a satisfactory scan in December confirming appendage closure.  He has a history of secondary hypercoagulable state and a CHA2DS2-VASc score of 3.            Today, he reports he is doing well and has no acute complaints.  He is at baseline.  EKGs/Labs/Other Studies Reviewed Today:     Echocardiogram:  TTE Sep 30, 2022 LVEF 50 to 55%.  Left atrial size mildly dilated.  Status post aortic valve replacement.   Monitors:  7 day monitor June 2025-- my interpretation Sinus  rhythm 48 to 99 bpm, average 65 bpm 3.18% PVC burden, rare supraventricular ectopy 8% burden of atrial arrhythmia, sometimes associated with symptoms.  Rates often not well-controlled. Symptom episodes also related to PVCs    Advanced imaging:  Cardiac CT June 11, 2023 Severe left atrial enlargement.  Moderate right atrial enlargement.  aneurysmal atrial septum.  Atricure appendage clip in good position with occlusion and no leakage.  23 mm bioprosthetic AVR.    EKG:   EKG Interpretation Date/Time:  Wednesday June 20 2024 11:43:27 EST Ventricular Rate:  72 PR Interval:  184 QRS Duration:  102 QT Interval:  386 QTC Calculation: 422 R Axis:   42  Text Interpretation: Normal sinus rhythm Incomplete right bundle branch block When compared with ECG of 20-May-2024 10:10, No significant change was found Confirmed by Nancey Scotts [phone]) on 06/20/2024 11:56:56 AM     Physical Exam:    VS:  BP 122/68 (BP Location: Right Arm, Patient Position: Sitting, Cuff Size: Normal)   Pulse 75   Ht 5' 7 (1.702 m)   Wt 156 lb (70.8 kg)   SpO2 97%   BMI 24.43 kg/m     Wt Readings from Last 3 Encounters:  06/20/24 156 lb (70.8 kg)  05/29/24 151 lb (68.5 kg)  05/14/24 159 lb (72.1 kg)     GEN: Well nourished, well developed in no acute distress CARDIAC: RRR, no murmurs, rubs, gallops RESPIRATORY:  Normal work of breathing MUSCULOSKELETAL: no edema  ASSESSMENT & PLAN:     Atypical atrial flutter Persistent atrial fibrillation Status post MAZE procedure with recurrence of atrial fibrillation and atypical flutter Mild symptoms, some fatigue EP study, ablation 02/2024 - Pvs were persistently isolated Right atrial atriotomy scar-dependent flutter was successfully ablated Monitoring with Apple Watch, no evidence of AF recurrence  Secondary hypercoagulable state CHA2DS2-VASc score is 3 Status post left atrial appendage occlusion CT confirmed occluded appendage Will stop  Eliquis  today  PVCs Burden < 5%  S/p surgical AVR     Signed, Eulas FORBES Furbish, MD  06/20/2024 11:57 AM    [facility] "

## 2024-06-25 ENCOUNTER — Ambulatory Visit: Admitting: Physician Assistant

## 2024-06-28 NOTE — Progress Notes (Unsigned)
 "     Ellouise Console, PA-C [address] [place], [place]  [address] Phone: [phone]   Primary Care Physician: [name] PARAS, MD  Primary Gastroenterologist:  Ellouise Console, PA-C / Dr. Gordy Starch   Chief Complaint:  Hx Colon Polyps, Repeat Colonoscopy       HPI:   Discussed the use of AI scribe software for clinical note transcription with the patient, who gave verbal consent to proceed.  He has personal history of adenomatous colon polyps and Family history of colon cancer in his father age 28.  He is due for 3-year repeat surveillance colonoscopy.   03/2021 last colonoscopy by Dr. Aneita: 12 mm sessile tubular adenoma polyp removed from mid transverse colon.  2 other (6 mm to 7 mm) tubular adenoma polyps removed from transverse colon.  Mild diverticulosis.  Small internal hemorrhoids.  Good prep.  Torturous colon.  3-year repeat colonoscopy (due 03/2024).   PMH: A-flutter and A-fib s/p ablation 03/12/2024, CAD s/p CABG 07/2022, aortic stenosis s/p aortic valve replacement 07/2022.  Echo 03/2024 showed LVEF 50%.  Patient recently saw cardiologist Dr. Wonda 04/04/2024 in follow-up and was maintaining sinus rhythm post ablation.  He has undergone surgical left atrial appendage ligation.  It was recommended he remain on anticoagulation 3 months post A-fib ablation.  Eliquis  was discontinued in the past month by cardiologist.  History of Present Illness Zachary Nole is a 79 year old male with prior colon polyps who presents for evaluation of gas, bloating, and constipation.  Abdominal Distension and Gas Bloating: - Persistent gas and bloating for at least two months - Minimal relief with Gas-X - No use of probiotics such as Align or Restora - No abdominal pain - Frequent bowel noises - Occasional urgency to defecate, often accompanied by gas  Constipation: - Intermittent constipation, associated with inadequate water intake - Aims to drink 60-70 ounces of water daily,  which helps prevent constipation - Intermittent use of Benefiber - No use of Miralax  in approximately one year - No use of Senokot - No diarrhea  Colorectal Surveillance: - Prior history of colon polyps - Scheduled for colonoscopy on February 27th  Anticoagulation Status: - Discontinued apixaban  in January after cardiology clearance following cardiac ablation in October - Off anticoagulation for several weeks    Current Outpatient Medications  Medication Sig Dispense Refill   Ascorbic Acid  (VITAMIN C ) 1000 MG tablet Take 1,000 mg by mouth 2 (two) times daily.     beta carotene  74999 UNIT capsule Take 25,000 Units by mouth daily.     clonazePAM  (KLONOPIN ) 0.5 MG tablet Take 1 tablet (0.5 mg total) by mouth at bedtime as needed for anxiety. 30 tablet 2   Evolocumab  (REPATHA  SURECLICK) 140 MG/ML SOAJ Inject 140 mg into the skin every 14 (fourteen) days. 6 mL 3   fluticasone  (FLONASE ) 50 MCG/ACT nasal spray SPRAY 1 SPRAY IN EACH NOSTRIL ONCE DAILY 16 mL 3   levothyroxine  (SYNTHROID ) 50 MCG tablet TAKE 1 TABLET BY MOUTH DAILY 90 tablet 2   Na Sulfate-K Sulfate-Mg Sulfate concentrate (SUPREP BOWEL PREP KIT) 17.5-3.13-1.6 GM/177ML SOLN Take 1 kit (354 mLs total) by mouth as directed. For colonoscopy prep 354 mL 0   pyridOXINE  (VITAMIN B-6) 100 MG tablet Take 100 mg by mouth daily.     rosuvastatin  (CRESTOR ) 40 MG tablet TAKE 1 TABLET BY MOUTH DAILY 90 tablet 1   triamcinolone  cream (KENALOG) 0.1 % Apply 1 Application topically daily as needed (Skin rash on  legs).     vitamin E 400 UNIT capsule Take 400 Units by mouth daily.     No current facility-administered medications for this visit.    Allergies as of 06/29/2024 - Review Complete 06/29/2024  Allergen Reaction Noted   Lipitor [atorvastatin] Other (See Comments)     Past Medical History:  Diagnosis Date   Adenomatous colon polyp 09/2007   Arthritis    OA / PAIN LEFT KNEE   Atrial fibrillation (HCC) 08/25/2022   Atrial flutter  (HCC) 10/02/2022   Cancer (HCC)    prostate cancer   Cataract    removed both eyes   Coronary artery disease    COVID-19 virus infection    Diverticulosis of colon    w/o hemorrage   Heart murmur    TOLD HE HAS A SLIGHT MURMUR   Hyperlipidemia    Inguinal hernia    left side    Thyroid  disease     Past Surgical History:  Procedure Laterality Date   A-FLUTTER ABLATION N/A 03/12/2024   Procedure: A-FLUTTER ABLATION;  Surgeon: Nancey Eulas BRAVO, MD;  Location: [facility];  Service: Cardiovascular;  Laterality: N/A;   AORTIC VALVE REPLACEMENT N/A 08/18/2022   Procedure: AORTIC VALVE REPLACEMENT (AVR) USING EDWARDS INSPIRIS AORTIC VALVE SIZE ;  Surgeon: Murriel Toribio DEL, MD;  Location: [facility] OR;  Service: Open Heart Surgery;  Laterality: N/A;   ATRIAL FIBRILLATION ABLATION N/A 03/12/2024   Procedure: ATRIAL FIBRILLATION ABLATION;  Surgeon: Nancey Eulas BRAVO, MD;  Location: [facility];  Service: Cardiovascular;  Laterality: N/A;   BACK SURGERY  05/12/2005   L5   CARDIAC CATHETERIZATION     CLIPPING OF ATRIAL APPENDAGE N/A 08/18/2022   Procedure: CLIPPING OF ATRIAL APPENDAGE USING ATRICURE 45 ATRICLIP;  Surgeon: Murriel Toribio DEL, MD;  Location: [facility] OR;  Service: Open Heart Surgery;  Laterality: N/A;  Median Sternotomy   COLONOSCOPY  1999   Negative; Dr Aneita   COLONOSCOPY  2004   tics, hemorrhoids   COLONOSCOPY  2009   polyps (T.A.)   CORONARY ARTERY BYPASS GRAFT N/A 08/18/2022   Procedure: CORONARY ARTERY BYPASS GRAFTING (CABG) X2 USING LEFT INTERNAL MAMMARY ARTERY AND LEFT ENDOSCOPICALLY HARVESTED GREATER SAPHENOUS VEIN.;  Surgeon: Murriel Toribio DEL, MD;  Location: [facility];  Service: Open Heart Surgery;  Laterality: N/A;   HERNIA REPAIR  1987   HERNIA REPAIR  10/2003   KNEE SURGERY  1969   Patellar fracture fragment Lt   LAMINECTOMY  2000   L4-5   LEFT HEART CATH AND CORONARY ANGIOGRAPHY N/A 08/16/2022   Procedure: LEFT HEART CATH AND CORONARY ANGIOGRAPHY;  Surgeon:  Wendel Lurena POUR, MD;  Location: [facility];  Service: Cardiovascular;  Laterality: N/A;   LUMBAR LAMINECTOMY/DECOMPRESSION MICRODISCECTOMY Right 10/08/2015   Procedure: MICRO LUMBAR DECOMPRESSION L4-L5 AND L5-S1 ON RIGHT ;  Surgeon: Reyes Billing, MD;  Location: [facility];  Service: Orthopedics;  Laterality: Right;   MASS EXCISION  04/26/2011   Procedure: MINOR EXCISION OF MASS;  Surgeon: Krystal CHRISTELLA Spinner, MD;  Location: [facility];  Service: General;  Laterality: Right;  Excise subcutaneous mass right axilla Minor Room   MAZE N/A 08/18/2022   Procedure: MAZE USING ATRICURE EARNEY BEAGLE;  Surgeon: Murriel Toribio DEL, MD;  Location: [facility] OR;  Service: Open Heart Surgery;  Laterality: N/A;   POLYPECTOMY     PROSTATECTOMY  04/16/2005   TEE WITHOUT CARDIOVERSION N/A 08/18/2022   Procedure: TRANSESOPHAGEAL ECHOCARDIOGRAM;  Surgeon: Murriel Toribio  H, MD;  Location: [facility] OR;  Service: Open Heart Surgery;  Laterality: N/A;   TONSILLECTOMY     TOTAL KNEE ARTHROPLASTY Left 07/03/2013   Procedure: LEFT TOTAL KNEE ARTHROPLASTY;  Surgeon: Donnice JONETTA Car, MD;  Location: [facility];  Service: Orthopedics;  Laterality: Left;    Review of Systems:    All systems reviewed and negative except where noted in HPI.    Physical Exam:  BP 104/70 (BP Location: Left Arm, Patient Position: Sitting, Cuff Size: Normal)   Pulse 72   Ht 5' 5.5 (1.664 m) Comment: height measured without shoes  Wt 159 lb 6 oz (72.3 kg)   BMI 26.12 kg/m  No LMP for male patient.  General: Well-nourished, well-developed in no acute distress.  Lungs: Clear to auscultation bilaterally. Non-labored. Heart: Regular rate and rhythm, no murmurs rubs or gallops.  Abdomen: Bowel sounds are normal; Abdomen is Soft; No hepatosplenomegaly, masses or hernias;  No Abdominal Tenderness; No guarding or rebound tenderness. Neuro: Alert and oriented x 3.  Grossly intact.  Psych: Alert and cooperative, normal mood and affect.   Imaging  Studies: No results found.  Labs: CBC    Component Value Date/Time   WBC 4.6 05/20/2024 0932   RBC 4.41 05/20/2024 0932   HGB 14.6 05/20/2024 0932   HGB 14.4 03/21/2024 1600   HCT 42.3 05/20/2024 0932   HCT 44.1 03/21/2024 1600   PLT 142 (L) 05/20/2024 0932   PLT 284 03/21/2024 1600   MCV 95.9 05/20/2024 0932   MCV 100 (H) 03/21/2024 1600   MCH 33.1 05/20/2024 0932   MCHC 34.5 05/20/2024 0932   RDW 13.5 05/20/2024 0932   RDW 13.0 03/21/2024 1600   LYMPHSABS 1.3 05/20/2024 0932   MONOABS 0.9 05/20/2024 0932   EOSABS 0.1 05/20/2024 0932   BASOSABS 0.0 05/20/2024 0932    CMP     Component Value Date/Time   NA 134 (L) 05/20/2024 0932   NA 143 04/04/2024 1234   K 4.3 05/20/2024 0932   CL 100 05/20/2024 0932   CO2 27 05/20/2024 0932   GLUCOSE 97 05/20/2024 0932   BUN 12 05/20/2024 0932   BUN 16 04/04/2024 1234   CREATININE 0.90 05/20/2024 0932   CALCIUM  9.8 05/20/2024 0932   PROT 7.2 03/07/2024 0756   PROT 6.9 10/28/2022 0847   ALBUMIN  4.4 03/07/2024 0756   ALBUMIN  4.3 10/28/2022 0847   AST 31 03/07/2024 0756   ALT 20 03/07/2024 0756   ALKPHOS 49 03/07/2024 0756   BILITOT 0.6 03/07/2024 0756   BILITOT 0.5 10/28/2022 0847   GFRNONAA >60 05/20/2024 0932   GFRAA >60 10/09/2015 0400    Assessment and Plan:   XZAYVIER FAGIN is a 79 y.o. y/o male returns for follow-up of:   1.  History of adenomatous colon polyps -Schedule repeat colonoscopy  2.  Family history of colon cancer in first-degree relative (father age 21) - Scheduling repeat colonoscopy I discussed risks of colonoscopy with patient to include risk of bleeding, colon perforation, and risk of sedation.  Patient expressed understanding and agrees to proceed with colonoscopy.    3.  Chronic constipation, intestinal gas, and bloating -Gave samples of Restora probiotic 1 capsule daily for 10 days.  If it helps, then he can purchase more over-the-counter. -Start OTC Senokot 2 tablets once daily before  bedtime. -Avoid MiraLAX  which caused increased gas and bloating.   4.  Comorbidities:   A-flutter and A-fib s/p ablation 03/12/2024, CAD s/p CABG 07/2022, aortic stenosis  s/p aortic valve replacement 07/2022.  Echo 03/2024 showed LVEF 50%.  Patient recently saw cardiologist Dr. Wonda 04/04/2024 in follow-up and was maintaining sinus rhythm post ablation.  He has undergone surgical left atrial appendage ligation.  It was recommended he remain on anticoagulation 3 months post A-fib ablation.  Eliquis  was discontinued in the past month by cardiologist. -He is currently off Eliquis  and appears to be stable to undergo colonoscopy in [facility].    Ellouise Console, PA-C  Follow up based on colonoscopy results and GI symptoms.   "

## 2024-06-29 ENCOUNTER — Ambulatory Visit: Admitting: Physician Assistant

## 2024-06-29 ENCOUNTER — Encounter: Payer: Self-pay | Admitting: Physician Assistant

## 2024-06-29 VITALS — BP 104/70 | HR 72 | Ht 65.5 in | Wt 159.4 lb

## 2024-06-29 DIAGNOSIS — Z860100 Personal history of colon polyps, unspecified: Secondary | ICD-10-CM

## 2024-06-29 DIAGNOSIS — Z800 Family history of malignant neoplasm of digestive organs: Secondary | ICD-10-CM

## 2024-06-29 DIAGNOSIS — K5904 Chronic idiopathic constipation: Secondary | ICD-10-CM

## 2024-06-29 DIAGNOSIS — R143 Flatulence: Secondary | ICD-10-CM

## 2024-06-29 MED ORDER — NA SULFATE-K SULFATE-MG SULF 17.5-3.13-1.6 GM/177ML PO SOLN: 1.0000 | ORAL | 0 refills | Status: AC

## 2024-06-29 NOTE — Patient Instructions (Addendum)
" ° °  Start OTC Senokot, Take 2 tablets once daily before bedtime for Constipation.  Start Samples of Restora Probiotic 1 capsule once daily for Gas / Bloating. If Restora helps, you may purchase more Over the counter.  We have sent the following medications to your pharmacy for you to pick up at your convenience: Suprep   Please purchase the following medications over the counter and take as directed: Restora - 1 tablet once daily.   You have been scheduled for a colonoscopy. Please follow written instructions given to you at your visit today.   If you use inhalers (even only as needed), please bring them with you on the day of your procedure.  DO NOT TAKE 7 DAYS PRIOR TO TEST- Trulicity (dulaglutide) Ozempic, Wegovy (semaglutide) Mounjaro, Zepbound (tirzepatide) Bydureon Bcise (exanatide extended release)  DO NOT TAKE 1 DAY PRIOR TO YOUR TEST Rybelsus (semaglutide) Adlyxin (lixisenatide) Victoza (liraglutide) Byetta (exanatide) ___________________________________________________________________________  Thank you for choosing me and  Gastroenterology.    "

## 2024-07-20 ENCOUNTER — Encounter: Admitting: Internal Medicine

## 2024-07-30 ENCOUNTER — Ambulatory Visit: Admitting: Cardiovascular Disease
